# Patient Record
Sex: Female | Born: 1955 | Race: Black or African American | Hispanic: No | Marital: Single | Smoking: Former smoker
Health system: Southern US, Community
[De-identification: ages and names within clinical notes are randomized; demographics above are authoritative.]

## PROBLEM LIST (undated history)

## (undated) ENCOUNTER — Inpatient Hospital Stay (HOSPITAL_COMMUNITY): Admission: EM | Payer: Self-pay | Source: Home / Self Care

## (undated) DIAGNOSIS — I4891 Unspecified atrial fibrillation: Secondary | ICD-10-CM

## (undated) DIAGNOSIS — R011 Cardiac murmur, unspecified: Secondary | ICD-10-CM

## (undated) DIAGNOSIS — M549 Dorsalgia, unspecified: Secondary | ICD-10-CM

## (undated) DIAGNOSIS — Z8709 Personal history of other diseases of the respiratory system: Secondary | ICD-10-CM

## (undated) DIAGNOSIS — Z87442 Personal history of urinary calculi: Secondary | ICD-10-CM

## (undated) DIAGNOSIS — E119 Type 2 diabetes mellitus without complications: Secondary | ICD-10-CM

## (undated) DIAGNOSIS — R51 Headache: Secondary | ICD-10-CM

## (undated) DIAGNOSIS — I209 Angina pectoris, unspecified: Secondary | ICD-10-CM

## (undated) DIAGNOSIS — I272 Pulmonary hypertension, unspecified: Secondary | ICD-10-CM

## (undated) DIAGNOSIS — E781 Pure hyperglyceridemia: Secondary | ICD-10-CM

## (undated) DIAGNOSIS — R0600 Dyspnea, unspecified: Secondary | ICD-10-CM

## (undated) DIAGNOSIS — N289 Disorder of kidney and ureter, unspecified: Secondary | ICD-10-CM

## (undated) DIAGNOSIS — E669 Obesity, unspecified: Secondary | ICD-10-CM

## (undated) DIAGNOSIS — M1990 Unspecified osteoarthritis, unspecified site: Secondary | ICD-10-CM

## (undated) DIAGNOSIS — Z9889 Other specified postprocedural states: Secondary | ICD-10-CM

## (undated) DIAGNOSIS — F419 Anxiety disorder, unspecified: Secondary | ICD-10-CM

## (undated) DIAGNOSIS — I219 Acute myocardial infarction, unspecified: Secondary | ICD-10-CM

## (undated) DIAGNOSIS — I509 Heart failure, unspecified: Secondary | ICD-10-CM

## (undated) DIAGNOSIS — K589 Irritable bowel syndrome without diarrhea: Secondary | ICD-10-CM

## (undated) DIAGNOSIS — N949 Unspecified condition associated with female genital organs and menstrual cycle: Secondary | ICD-10-CM

## (undated) DIAGNOSIS — M5431 Sciatica, right side: Secondary | ICD-10-CM

## (undated) DIAGNOSIS — T1490XA Injury, unspecified, initial encounter: Secondary | ICD-10-CM

## (undated) DIAGNOSIS — G8929 Other chronic pain: Secondary | ICD-10-CM

## (undated) DIAGNOSIS — I10 Essential (primary) hypertension: Secondary | ICD-10-CM

## (undated) DIAGNOSIS — J449 Chronic obstructive pulmonary disease, unspecified: Secondary | ICD-10-CM

## (undated) DIAGNOSIS — G4730 Sleep apnea, unspecified: Secondary | ICD-10-CM

## (undated) DIAGNOSIS — M25559 Pain in unspecified hip: Secondary | ICD-10-CM

## (undated) DIAGNOSIS — K219 Gastro-esophageal reflux disease without esophagitis: Secondary | ICD-10-CM

## (undated) DIAGNOSIS — R232 Flushing: Secondary | ICD-10-CM

## (undated) DIAGNOSIS — R519 Headache, unspecified: Secondary | ICD-10-CM

## (undated) DIAGNOSIS — B009 Herpesviral infection, unspecified: Secondary | ICD-10-CM

## (undated) DIAGNOSIS — I2510 Atherosclerotic heart disease of native coronary artery without angina pectoris: Secondary | ICD-10-CM

## (undated) DIAGNOSIS — K648 Other hemorrhoids: Secondary | ICD-10-CM

## (undated) DIAGNOSIS — D259 Leiomyoma of uterus, unspecified: Secondary | ICD-10-CM

## (undated) HISTORY — DX: Disorder of kidney and ureter, unspecified: N28.9

## (undated) HISTORY — DX: Sleep apnea, unspecified: G47.30

## (undated) HISTORY — PX: OTHER SURGICAL HISTORY: SHX169

## (undated) HISTORY — DX: Morbid (severe) obesity due to excess calories: E66.01

## (undated) HISTORY — PX: APPENDECTOMY: SHX54

## (undated) HISTORY — PX: CARDIAC CATHETERIZATION: SHX172

## (undated) HISTORY — DX: Unspecified osteoarthritis, unspecified site: M19.90

## (undated) HISTORY — DX: Unspecified condition associated with female genital organs and menstrual cycle: N94.9

## (undated) HISTORY — DX: Injury, unspecified, initial encounter: T14.90XA

## (undated) HISTORY — DX: Flushing: R23.2

## (undated) HISTORY — DX: Herpesviral infection, unspecified: B00.9

## (undated) HISTORY — DX: Essential (primary) hypertension: I10

## (undated) HISTORY — DX: Heart failure, unspecified: I50.9

## (undated) HISTORY — DX: Chronic obstructive pulmonary disease, unspecified: J44.9

## (undated) HISTORY — DX: Pure hyperglyceridemia: E78.1

---
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
## 1984-01-01 HISTORY — PX: LAPAROSCOPIC CHOLECYSTECTOMY: SUR755

## 2007-07-29 ENCOUNTER — Encounter: Payer: Self-pay | Admitting: Pulmonary Disease

## 2007-07-29 ENCOUNTER — Inpatient Hospital Stay (HOSPITAL_COMMUNITY): Admission: EM | Admit: 2007-07-29 | Discharge: 2007-07-31 | Payer: Self-pay | Admitting: Emergency Medicine

## 2007-08-09 ENCOUNTER — Encounter: Payer: Self-pay | Admitting: Pulmonary Disease

## 2007-08-14 ENCOUNTER — Emergency Department (HOSPITAL_COMMUNITY): Admission: EM | Admit: 2007-08-14 | Discharge: 2007-08-14 | Payer: Self-pay | Admitting: Emergency Medicine

## 2007-09-15 ENCOUNTER — Ambulatory Visit: Payer: Self-pay | Admitting: Pulmonary Disease

## 2007-09-19 ENCOUNTER — Ambulatory Visit: Admission: RE | Admit: 2007-09-19 | Discharge: 2007-09-19 | Payer: Self-pay | Admitting: Pulmonary Disease

## 2007-09-19 ENCOUNTER — Encounter: Payer: Self-pay | Admitting: Pulmonary Disease

## 2007-10-05 ENCOUNTER — Encounter: Payer: Self-pay | Admitting: Pulmonary Disease

## 2007-10-06 ENCOUNTER — Encounter: Payer: Self-pay | Admitting: Pulmonary Disease

## 2007-11-18 ENCOUNTER — Encounter: Payer: Self-pay | Admitting: Pulmonary Disease

## 2007-12-01 ENCOUNTER — Telehealth (INDEPENDENT_AMBULATORY_CARE_PROVIDER_SITE_OTHER): Payer: Self-pay | Admitting: *Deleted

## 2007-12-11 DIAGNOSIS — M1990 Unspecified osteoarthritis, unspecified site: Secondary | ICD-10-CM | POA: Insufficient documentation

## 2007-12-11 DIAGNOSIS — G4730 Sleep apnea, unspecified: Secondary | ICD-10-CM | POA: Insufficient documentation

## 2007-12-11 DIAGNOSIS — G4733 Obstructive sleep apnea (adult) (pediatric): Secondary | ICD-10-CM | POA: Insufficient documentation

## 2007-12-12 ENCOUNTER — Ambulatory Visit: Payer: Self-pay | Admitting: Pulmonary Disease

## 2007-12-12 DIAGNOSIS — I272 Pulmonary hypertension, unspecified: Secondary | ICD-10-CM | POA: Insufficient documentation

## 2007-12-16 DIAGNOSIS — I5032 Chronic diastolic (congestive) heart failure: Secondary | ICD-10-CM | POA: Insufficient documentation

## 2007-12-16 DIAGNOSIS — I5033 Acute on chronic diastolic (congestive) heart failure: Secondary | ICD-10-CM | POA: Insufficient documentation

## 2008-02-25 ENCOUNTER — Encounter (INDEPENDENT_AMBULATORY_CARE_PROVIDER_SITE_OTHER): Payer: Self-pay | Admitting: *Deleted

## 2008-03-02 ENCOUNTER — Telehealth: Payer: Self-pay | Admitting: Pulmonary Disease

## 2008-03-22 ENCOUNTER — Ambulatory Visit: Payer: Self-pay | Admitting: Pulmonary Disease

## 2008-04-16 ENCOUNTER — Encounter: Payer: Self-pay | Admitting: Pulmonary Disease

## 2009-04-07 ENCOUNTER — Ambulatory Visit (HOSPITAL_COMMUNITY): Admission: RE | Admit: 2009-04-07 | Discharge: 2009-04-07 | Payer: Self-pay | Admitting: Obstetrics and Gynecology

## 2009-06-22 ENCOUNTER — Encounter: Payer: Self-pay | Admitting: Obstetrics and Gynecology

## 2009-06-22 ENCOUNTER — Ambulatory Visit: Payer: Self-pay | Admitting: Obstetrics & Gynecology

## 2009-06-22 LAB — CONVERTED CEMR LAB: CA 125: 11.3 units/mL (ref 0.0–30.2)

## 2009-06-30 ENCOUNTER — Ambulatory Visit (HOSPITAL_COMMUNITY): Admission: RE | Admit: 2009-06-30 | Discharge: 2009-06-30 | Payer: Self-pay | Admitting: Obstetrics and Gynecology

## 2009-07-14 ENCOUNTER — Ambulatory Visit: Payer: Self-pay | Admitting: Obstetrics and Gynecology

## 2009-07-14 LAB — CONVERTED CEMR LAB
BUN: 23 mg/dL (ref 6–23)
Creatinine, Ser: 1.17 mg/dL (ref 0.40–1.20)

## 2009-09-06 ENCOUNTER — Emergency Department (HOSPITAL_COMMUNITY): Admission: EM | Admit: 2009-09-06 | Discharge: 2009-09-06 | Payer: Self-pay | Admitting: Emergency Medicine

## 2010-01-26 ENCOUNTER — Inpatient Hospital Stay (HOSPITAL_COMMUNITY): Admission: AD | Admit: 2010-01-26 | Discharge: 2010-01-29 | Payer: Self-pay | Admitting: Cardiology

## 2010-05-04 ENCOUNTER — Ambulatory Visit (HOSPITAL_COMMUNITY): Admission: RE | Admit: 2010-05-04 | Discharge: 2010-05-04 | Payer: Self-pay | Admitting: Family Medicine

## 2010-05-18 ENCOUNTER — Ambulatory Visit (HOSPITAL_COMMUNITY): Admission: RE | Admit: 2010-05-18 | Discharge: 2010-05-18 | Payer: Self-pay | Admitting: General Surgery

## 2010-06-05 ENCOUNTER — Ambulatory Visit (HOSPITAL_COMMUNITY): Admission: RE | Admit: 2010-06-05 | Discharge: 2010-06-05 | Payer: Self-pay | Admitting: General Surgery

## 2010-06-21 ENCOUNTER — Encounter: Admission: RE | Admit: 2010-06-21 | Discharge: 2010-06-22 | Payer: Self-pay | Admitting: General Surgery

## 2010-10-11 ENCOUNTER — Ambulatory Visit: Payer: Self-pay | Admitting: Gastroenterology

## 2010-10-11 ENCOUNTER — Encounter: Payer: Self-pay | Admitting: Gastroenterology

## 2010-10-11 DIAGNOSIS — R197 Diarrhea, unspecified: Secondary | ICD-10-CM | POA: Insufficient documentation

## 2010-10-12 ENCOUNTER — Encounter: Payer: Self-pay | Admitting: Gastroenterology

## 2010-10-14 ENCOUNTER — Encounter: Payer: Self-pay | Admitting: Gastroenterology

## 2010-10-18 ENCOUNTER — Ambulatory Visit: Payer: Self-pay | Admitting: Gastroenterology

## 2010-10-18 ENCOUNTER — Ambulatory Visit (HOSPITAL_COMMUNITY): Admission: RE | Admit: 2010-10-18 | Discharge: 2010-10-18 | Payer: Self-pay | Admitting: Gastroenterology

## 2010-10-18 HISTORY — PX: COLONOSCOPY: SHX174

## 2010-11-16 ENCOUNTER — Encounter
Admission: RE | Admit: 2010-11-16 | Discharge: 2011-01-02 | Payer: Self-pay | Source: Home / Self Care | Attending: General Surgery | Admitting: General Surgery

## 2010-12-12 ENCOUNTER — Inpatient Hospital Stay (HOSPITAL_COMMUNITY)
Admission: RE | Admit: 2010-12-12 | Discharge: 2010-12-14 | Payer: Self-pay | Source: Home / Self Care | Attending: General Surgery | Admitting: General Surgery

## 2010-12-12 HISTORY — PX: LAPAROSCOPIC GASTRIC BANDING: SHX1100

## 2011-01-02 ENCOUNTER — Encounter: Admit: 2011-01-02 | Discharge: 2011-01-30 | Payer: Self-pay | Attending: General Surgery | Admitting: General Surgery

## 2011-01-09 ENCOUNTER — Encounter (INDEPENDENT_AMBULATORY_CARE_PROVIDER_SITE_OTHER): Payer: Self-pay | Admitting: *Deleted

## 2011-01-22 ENCOUNTER — Encounter: Payer: Self-pay | Admitting: *Deleted

## 2011-01-30 NOTE — Assessment & Plan Note (Signed)
Summary: ov to discuss cpap   Referred by:  Mathis Bud PCP:  [facility]  Chief Complaint:  Follow up. Pt /co increased sob with activity. Pt states she is using her cpap every night. Pt needs rx for nasacort-out of this med.Marland Kitchen  History of Present Illness: Pt has been trying to wear cpap, but awakens with it at night and cannot return to sleep.  No problems with mask or pressure.  No mask fit issues.  Does not think that she has worn enough to judge whether it is helping sleep.  At least she is willing to continue working with me on device tolerance.     Current Allergies: No known allergies       Vital Signs:  Patient Profile:   55 Years Old Female Height:     60 inches O2 Sat:      95 % O2 treatment:    Room Air Temp:     98.0 degrees F oral Pulse rate:   105 / minute BP sitting:   136 / 74  (left arm) Cuff size:   large  Vitals Entered By: Valrie Hart LPN (March 23, 579FGE 624THL AM)             Comments Medications reviewed with patient  ..................................................................Marland KitchenValrie Hart LPN  March 23, 579FGE 624THL AM      Physical Exam  General:     overweight female in no acute distress Nose:     new skin breakdown of pressure necrosis from the CPAP mask     Impression & Recommendations:  Problem # 1:  SLEEP APNEA (ICD-780.57) the patient continues to have frequent awakenings during the night with CPAP, but denies any difficulties with mask fit or pressure tolerance.  At this point, will try her on a sedative hypnotic for approximately 1 to two weeks to see if we can get her sleeping through the night.  I also wonder if she is awakening because of inadequate CPAP pressure to control her events.  Will get her on an auto titrating device pressure optimization.  I will call her with the results of this, but she is to let us know if she continues to have difficulty with sleep disruption.   Problem # 2:  PULMONARY HYPERTENSION, SECONDARY  (ICD-416.8) secondary to chronic diastolic heart failure.  Medications Added to Medication List This Visit: 1)  Trazodone Hcl 50 Mg Tabs (Trazodone hcl) .... At bedtime   Patient Instructions: 1)  will try trazadone at bedtime for the next 2 weeks to help you work thru cpap tolerance. 2)  work on weight loss 3)  will get auto machine for you to use for 2 weeks, to optimize your pressure.  will let you know the results. 4)  f/u with me in 55mos.    Prescriptions: NASACORT AQ 55 MCG/ACT  AERS (TRIAMCINOLONE ACETONIDE(NASAL)) inhale 2 sprays in each nostril once daily  #1 x 4   Entered by:   Valrie Hart LPN   Authorized by:   Kathee Delton MD   Signed by:   Valrie Hart LPN on 579FGE   Method used:   Print then Give to Patient   RxID:   [id number] TRAZODONE HCL 50 MG  TABS (TRAZODONE HCL) At bedtime  #14 x 0   Entered and Authorized by:   Kathee Delton MD   Signed by:   Kathee Delton MD on 03/22/2008   Method used:   Print then Give to Patient   RxID:  [id number]  ] 

## 2011-01-30 NOTE — Letter (Signed)
Summary: TCS ORDER  TCS ORDER   Imported By: Sofie Rower 10/12/2010 10:33:30  _____________________________________________________________________  External Attachment:    Type:   Image     Comment:   External Document

## 2011-01-30 NOTE — Progress Notes (Signed)
Summary: RESPIRATORY PROBLEM LMTCBx2   Phone Note Call from Patient   Caller: Patient Call For: [facility] Summary of Call: NEED TO TALK TO NURSE ABOUT RESPIRATORY PROBLEM PATIENT'S CHART HAS BEEN REQUESTED  Initial call taken by: Gustavus Bryant,  March 02, 2008 2:53 PM  Follow-up for Phone Call        [facility] Follow-up by: Jim Like,  March 02, 2008 4:56 PM  Additional Follow-up for Phone Call Additional follow up Details #1::        LMOM TCB Additional Follow-up by: Valrie Hart LPN,  March  4, 579FGE 9:47 AM    Additional Follow-up for Phone Call Additional follow up Details #2::    spoke with pt. pt stated she got a letter from our office stating she needed to make an appointment to go over test results.  i offered an appt with kc to discuss these results but pt refuse appt. pt currently does not have any medical insurance. i informed her that [employer] could probably set up a payment plan for her. however, pt refused this as well.   pt would like to discuss her results over the phone with dr. Gwenette Greet and would also like to discuss getting on o2. pt states this is something she discussed with dr. Gwenette Greet at her last ov. please advise.  Follow-up by: Valrie Hart LPN,  March  4, 579FGE 2:25 PM  Additional Follow-up for Phone Call Additional follow up Details #3:: Details for Additional Follow-up Action Taken: all of the letters were sent back in the fall for tests at that time.  she did f/u with me in december, and I discussed all of the tests with her.  I started her on cpap at that time.  She does need f/u if she is going to stay on cpap.  She has had an extensive pulmonary work up, and her shortness of breath is due to her heart not lungs.  She does not meet the criteria for home oxygen. please schedule ov with me if she wants to stay on cpap.  we can work out billing issues later  lmom tcb .................................................................Marland KitchenMarland KitchenTilden Dome  March 04, 2008 8:47 AM  Additional Follow-up by: Kathee Delton MD,  March 03, 2008 6:13 PM  Pt aware that she needs OV with [facility] and she is making that appt today with the schedulers; will discuss billing issues later. Blair Hailey

## 2011-01-30 NOTE — Assessment & Plan Note (Signed)
Summary: DIARRHEA: IBS, bile salt , DM, lactose   Visit Type:  Initial Consult Referring Provider:  Hilma Favors Primary Care Provider:  Hilma Favors, M.D.  Chief Complaint:  right side pain/ abnormal stools.  History of Present Illness: Passing white things in her stool. Pain in right side of abd. Never had TCS. Gonna have bariatric surgery. No blood in her stool recently. Has mucous in her stool.  No black stool Heartburn under control w/ diet. Problem with liquids: random, feels spasm, ? frequency. Has diarrhea all day and has to know where BR are along the route: x 4-5 years. No stool studies. No travel, fresh water. On Abx w/i last 6 mos. Just finished Zpk for URI. No change in stools. 2 nl stools: MON and  yesterday. Told she had IBS when she was younger and 4 years ago. Right side pain, pain better after BMs and then happens again. Milk: rare,  Cheese: rare, Ice Cream: to help her sleep.    Current Medications (verified): 1)  Hydrochlorothiazide 25 Mg  Tabs (Hydrochlorothiazide) .... Take 1 Tablet By Mouth Once A Day 2)  Hydrocodone-Acetaminophen 5-500 Mg Tabs (Hydrocodone-Acetaminophen) .... One Tablet Every Six Hours As Needed 3)  Klor-Con M20 20 Meq Cr-Tabs (Potassium Chloride Crys Cr) .... Take 1 Tablet By Mouth Once A Day 4)  Lofibra 160 Mg Tabs (Fenofibrate) .... Take 1 Tablet By Mouth Once A Day 5)  Albuterol Inhaler .... As Directed 6)  Nasal Spray .... As Directed 7)  Cozaar 100 Mg Tabs (Losartan Potassium) .... Take 1 Tablet By Mouth Once A Day 8)  Furosemide 40 Mg Tabs (Furosemide) .... Take 1 Tablet By Mouth Once A Day 9)  Glucophage 500 Mg Tabs (Metformin Hcl) .... One Tablet Twice Daily 10)  Isosorbide Dinitrate 30 Mg Tabs (Isosorbide Dinitrate) .... Take 1 Tablet By Mouth Once A Day 11)  Loratadine 10 Mg Tabs (Loratadine) .... Take 1 Tablet By Mouth Once A Day 12)  Fish Oil 1000 Mg .... One Tablet Three Times Daily 13)  Vitamins With Minerals .... Take 1 Tablet By Mouth Once A  Day  Allergies (verified): No Known Drug Allergies  Past History:  Past Medical History: Osteoarthritis Sleep Apnea, 13 dyslipidemia cholecystectomy hypertension  Past Surgical History: Cholecystectomy/stones Ex Lap Pilonidal Cyst Cone Bx: cervical dysplasia  Family History: Heart disease: both mother and father side Cancer: paternal grandfather( throat), sister(breast) paternal aunts (breast)  No FH of Colon Cancer or polyps  Social History: Patient states former smoker.  single no children  FORMER: healthcare worker, DISABLED from CHF, pulm HTN, arthritis.   Review of Systems       PER HPI OTHERWISE ALL SYSTEMS NEGATIVE.  Vital Signs:  Patient profile:   55 year old female Height:      60 inches Weight:      290 pounds BMI:     56.84 Temp:     98.3 degrees F oral Pulse rate:   72 / minute BP sitting:   112 / 70  (left arm) Cuff size:   large  Vitals Entered By: Waldon Merl LPN (October 12, 624THL 3:07 PM)  Physical Exam  General:  Well developed, well nourished, no acute distress. Head:  Normocephalic and atraumatic. Eyes:  PERRL, no icterus. Mouth:  No deformity or lesions. Neck:  Supple; no masses. Lungs:  Clear throughout to auscultation. Heart:  Regular rate and rhythm; no murmurs. Abdomen:  Soft, MILD TTP right periumbilical region,  nondistended. Normal bowel sounds. obese Extremities:  No edema noted. Neurologic:  Alert and  oriented x4;  grossly normal neurologically.  Impression & Recommendations:  Problem # 1:  DIARRHEA (ICD-787.91)  Cause for diarrhea: IBS, bile salt, diabetes, lactose, doubt microscopic colitis. Follow a low fat/low dairy diabetic diet. SEE HO. Use Levsin as needed for abd pain or diarrhea. MAY CAUSE DRY MOUTH/EYES, URINARY RETENTION, OR drowsiness. Colonoscopy/RANDOM BIOPSIES next week. STOOL STUDIES. ADD CALCIUM 500 MG TWICE DAILY WITH MEALS. FOLLWO UP IN 3 MOS.  CC: PCP  Orders: T-Fecal WBC  [id number]) T-Stool Giardia / Crypto- EIA ([id number]) T-Culture, C-Diff Toxin A/B [id number]) T-Culture, C-Diff Toxin A/B [id number]) T-Culture, C-Diff Toxin A/B [id number])  Patient Instructions: 1)  Cause for diarrhea: IBS, bile salt, diabetes, lactose 2)  Follow a low fat/low dairy diabetic diet. SEE HO. 3)  Use Levsin as needed for abd pain or diarrhea. 4)  MAY CAUSE DRY MOUTH/EYES, URINARY RETENTION, OR drowsiness. 5)  Colonoscopy next week. 6)  ADD CALCIUM 500 MG TWICE DAILY WITH MEALS. 7)  FOLLWO UP IN 3 MOS. 8)  The medication list was reviewed and reconciled.  All changed / newly prescribed medications were explained.  A complete medication list was provided to the patient / caregiver. Prescriptions: LEVSIN/SL 0.125 MG SUBL (HYOSCYAMINE SULFATE) 1-2 sl as needed abd pain or diarrhea. May repeat every 4-6 hours. No more than 8 tabs a day.  #30 x 5   Entered and Authorized by:   Danie Binder MD   Signed by:   Danie Binder MD on 10/11/2010   Method used:   Electronically to        [facility] (retail)       [address]       [place], [place]  [address]       Ph: [phone]       Fax: [phone]   RxIDMZ:[id number]   Appended Document: DIARRHEA: IBS, bile salt , DM, lactose 3 MONTH F/U REMINDER IS IN THE COMPUTER  Appended Document: Orders Update    Clinical Lists Changes  Orders: Added new Service order of New Patient Level V [phone]) - Signed

## 2011-02-01 NOTE — Letter (Signed)
Summary: Recall Office Visit  [facility]  [address]   [place], [place] [address]   Phone: [phone]  Fax: [phone]      January 09, 2011   SHELINA JUNGLING [address] [place],   [address] 06-15-1956   Dear Ms. Berch,   According to our records, it is time for you to schedule a follow-up office visit with [place].   At your convenience, please call [phone] to schedule an office visit. If you have any questions, concerns, or feel that this letter is in error, we would appreciate your call.   Sincerely,    [facility] Ph: [phone]   Fax: [phone]

## 2011-02-02 NOTE — Medication Information (Signed)
Summary: LEVSIN  LEVSIN   Imported By: Hoy Morn 10/11/2010 16:47:01  _____________________________________________________________________  External Attachment:    Type:   Image     Comment:   External Document  Appended Document: LEVSIN    Prescriptions: HYOMAX-SL 0.125 MG SUBL (HYOSCYAMINE SULFATE) 1-2 sl as needed abd pain or diarrhea. May repeat every 4-6 hours. No more than 8 tabs a day.  #30 x 5   Entered and Authorized by:   Laureen Ochs. Bernarda Caffey   Signed by:   Laureen Ochs Lewis PA-C on 10/11/2010   Method used:   Electronically to        [facility] (retail)       [address]       [place], [place]  [address]       Ph: [phone]       Fax: [phone]   RxID:   [id number]

## 2011-02-12 ENCOUNTER — Encounter: Payer: Medicare HMO | Attending: General Surgery | Admitting: *Deleted

## 2011-02-12 DIAGNOSIS — Z09 Encounter for follow-up examination after completed treatment for conditions other than malignant neoplasm: Secondary | ICD-10-CM | POA: Insufficient documentation

## 2011-02-12 DIAGNOSIS — Z713 Dietary counseling and surveillance: Secondary | ICD-10-CM | POA: Insufficient documentation

## 2011-02-12 DIAGNOSIS — Z9884 Bariatric surgery status: Secondary | ICD-10-CM | POA: Insufficient documentation

## 2011-03-12 LAB — CBC
HCT: 33.1 % — ABNORMAL LOW (ref 36.0–46.0)
Hemoglobin: 10.6 g/dL — ABNORMAL LOW (ref 12.0–15.0)
MCH: 31.1 pg (ref 26.0–34.0)
MCHC: 32 g/dL (ref 30.0–36.0)
MCV: 97.1 fL (ref 78.0–100.0)
Platelets: 270 10*3/uL (ref 150–400)
RBC: 3.41 MIL/uL — ABNORMAL LOW (ref 3.87–5.11)
RDW: 13.6 % (ref 11.5–15.5)
WBC: 9.4 10*3/uL (ref 4.0–10.5)

## 2011-03-12 LAB — BASIC METABOLIC PANEL
BUN: 19 mg/dL (ref 6–23)
CO2: 30 mEq/L (ref 19–32)
Calcium: 9.2 mg/dL (ref 8.4–10.5)
Chloride: 107 mEq/L (ref 96–112)
Creatinine, Ser: 1.64 mg/dL — ABNORMAL HIGH (ref 0.4–1.2)
GFR calc Af Amer: 40 mL/min — ABNORMAL LOW (ref >60)
GFR calc non Af Amer: 33 mL/min — ABNORMAL LOW (ref >60)
Glucose, Bld: 106 mg/dL — ABNORMAL HIGH (ref 70–99)
Potassium: 4 mEq/L (ref 3.5–5.1)
Sodium: 144 mEq/L (ref 135–145)

## 2011-03-12 LAB — GLUCOSE, CAPILLARY
Glucose-Capillary: 107 mg/dL — ABNORMAL HIGH (ref 70–99)
Glucose-Capillary: 113 mg/dL — ABNORMAL HIGH (ref 70–99)
Glucose-Capillary: 123 mg/dL — ABNORMAL HIGH (ref 70–99)

## 2011-03-13 LAB — DIFFERENTIAL
Basophils Absolute: 0 10*3/uL (ref 0.0–0.1)
Basophils Absolute: 0 10*3/uL (ref 0.0–0.1)
Basophils Relative: 0 % (ref 0–1)
Basophils Relative: 0 % (ref 0–1)
Eosinophils Absolute: 0.2 10*3/uL (ref 0.0–0.7)
Eosinophils Absolute: 0.3 10*3/uL (ref 0.0–0.7)
Eosinophils Relative: 1 % (ref 0–5)
Eosinophils Relative: 3 % (ref 0–5)
Lymphocytes Relative: 24 % (ref 12–46)
Lymphocytes Relative: 27 % (ref 12–46)
Lymphs Abs: 2.9 10*3/uL (ref 0.7–4.0)
Lymphs Abs: 3.2 10*3/uL (ref 0.7–4.0)
Monocytes Absolute: 0.6 10*3/uL (ref 0.1–1.0)
Monocytes Absolute: 0.8 10*3/uL (ref 0.1–1.0)
Monocytes Relative: 5 % (ref 3–12)
Monocytes Relative: 7 % (ref 3–12)
Neutro Abs: 7.4 10*3/uL (ref 1.7–7.7)
Neutro Abs: 8.2 10*3/uL — ABNORMAL HIGH (ref 1.7–7.7)
Neutrophils Relative %: 63 % (ref 43–77)
Neutrophils Relative %: 70 % (ref 43–77)

## 2011-03-13 LAB — CBC
HCT: 33.4 % — ABNORMAL LOW (ref 36.0–46.0)
HCT: 39.8 % (ref 36.0–46.0)
Hemoglobin: 11.3 g/dL — ABNORMAL LOW (ref 12.0–15.0)
Hemoglobin: 13 g/dL (ref 12.0–15.0)
MCH: 31.1 pg (ref 26.0–34.0)
MCH: 32 pg (ref 26.0–34.0)
MCHC: 32.7 g/dL (ref 30.0–36.0)
MCHC: 33.8 g/dL (ref 30.0–36.0)
MCV: 94.6 fL (ref 78.0–100.0)
MCV: 95.2 fL (ref 78.0–100.0)
Platelets: 275 10*3/uL (ref 150–400)
Platelets: 342 10*3/uL (ref 150–400)
RBC: 3.53 MIL/uL — ABNORMAL LOW (ref 3.87–5.11)
RBC: 4.18 MIL/uL (ref 3.87–5.11)
RDW: 13.4 % (ref 11.5–15.5)
RDW: 13.6 % (ref 11.5–15.5)
WBC: 11.7 10*3/uL — ABNORMAL HIGH (ref 4.0–10.5)
WBC: 11.8 10*3/uL — ABNORMAL HIGH (ref 4.0–10.5)

## 2011-03-13 LAB — COMPREHENSIVE METABOLIC PANEL
ALT: 34 U/L (ref 0–35)
AST: 26 U/L (ref 0–37)
Albumin: 4.3 g/dL (ref 3.5–5.2)
Alkaline Phosphatase: 39 U/L (ref 39–117)
BUN: 37 mg/dL — ABNORMAL HIGH (ref 6–23)
CO2: 31 mEq/L (ref 19–32)
Calcium: 10 mg/dL (ref 8.4–10.5)
Chloride: 93 mEq/L — ABNORMAL LOW (ref 96–112)
Creatinine, Ser: 1.45 mg/dL — ABNORMAL HIGH (ref 0.4–1.2)
GFR calc Af Amer: 46 mL/min — ABNORMAL LOW (ref >60)
GFR calc non Af Amer: 38 mL/min — ABNORMAL LOW (ref >60)
Glucose, Bld: 140 mg/dL — ABNORMAL HIGH (ref 70–99)
Potassium: 3.4 mEq/L — ABNORMAL LOW (ref 3.5–5.1)
Sodium: 141 mEq/L (ref 135–145)
Total Bilirubin: 0.5 mg/dL (ref 0.3–1.2)
Total Protein: 8 g/dL (ref 6.0–8.3)

## 2011-03-13 LAB — GLUCOSE, CAPILLARY
Glucose-Capillary: 113 mg/dL — ABNORMAL HIGH (ref 70–99)
Glucose-Capillary: 117 mg/dL — ABNORMAL HIGH (ref 70–99)
Glucose-Capillary: 120 mg/dL — ABNORMAL HIGH (ref 70–99)
Glucose-Capillary: 123 mg/dL — ABNORMAL HIGH (ref 70–99)
Glucose-Capillary: 126 mg/dL — ABNORMAL HIGH (ref 70–99)
Glucose-Capillary: 134 mg/dL — ABNORMAL HIGH (ref 70–99)
Glucose-Capillary: 152 mg/dL — ABNORMAL HIGH (ref 70–99)
Glucose-Capillary: 158 mg/dL — ABNORMAL HIGH (ref 70–99)
Glucose-Capillary: 170 mg/dL — ABNORMAL HIGH (ref 70–99)
Glucose-Capillary: 197 mg/dL — ABNORMAL HIGH (ref 70–99)

## 2011-03-13 LAB — BASIC METABOLIC PANEL
BUN: 26 mg/dL — ABNORMAL HIGH (ref 6–23)
CO2: 29 mEq/L (ref 19–32)
Calcium: 9.1 mg/dL (ref 8.4–10.5)
Chloride: 103 mEq/L (ref 96–112)
Creatinine, Ser: 1.54 mg/dL — ABNORMAL HIGH (ref 0.4–1.2)
GFR calc Af Amer: 43 mL/min — ABNORMAL LOW (ref >60)
GFR calc non Af Amer: 35 mL/min — ABNORMAL LOW (ref >60)
Glucose, Bld: 115 mg/dL — ABNORMAL HIGH (ref 70–99)
Potassium: 3.7 mEq/L (ref 3.5–5.1)
Sodium: 140 mEq/L (ref 135–145)

## 2011-03-13 LAB — SURGICAL PCR SCREEN
MRSA, PCR: NEGATIVE
Staphylococcus aureus: NEGATIVE

## 2011-03-18 LAB — GLUCOSE, CAPILLARY
Glucose-Capillary: 156 mg/dL — ABNORMAL HIGH (ref 70–99)
Glucose-Capillary: 158 mg/dL — ABNORMAL HIGH (ref 70–99)
Glucose-Capillary: 183 mg/dL — ABNORMAL HIGH (ref 70–99)
Glucose-Capillary: 184 mg/dL — ABNORMAL HIGH (ref 70–99)
Glucose-Capillary: 194 mg/dL — ABNORMAL HIGH (ref 70–99)
Glucose-Capillary: 219 mg/dL — ABNORMAL HIGH (ref 70–99)
Glucose-Capillary: 226 mg/dL — ABNORMAL HIGH (ref 70–99)
Glucose-Capillary: 231 mg/dL — ABNORMAL HIGH (ref 70–99)
Glucose-Capillary: 248 mg/dL — ABNORMAL HIGH (ref 70–99)
Glucose-Capillary: 253 mg/dL — ABNORMAL HIGH (ref 70–99)
Glucose-Capillary: 262 mg/dL — ABNORMAL HIGH (ref 70–99)

## 2011-03-18 LAB — D-DIMER, QUANTITATIVE (NOT AT ARMC): D-Dimer, Quant: 0.36 ug/mL-FEU (ref 0.00–0.48)

## 2011-03-18 LAB — CBC
HCT: 36.4 % (ref 36.0–46.0)
HCT: 36.7 % (ref 36.0–46.0)
HCT: 36.9 % (ref 36.0–46.0)
Hemoglobin: 12.4 g/dL (ref 12.0–15.0)
Hemoglobin: 12.6 g/dL (ref 12.0–15.0)
Hemoglobin: 12.7 g/dL (ref 12.0–15.0)
MCHC: 34.1 g/dL (ref 30.0–36.0)
MCHC: 34.2 g/dL (ref 30.0–36.0)
MCHC: 34.5 g/dL (ref 30.0–36.0)
MCV: 94.6 fL (ref 78.0–100.0)
MCV: 95.9 fL (ref 78.0–100.0)
MCV: 95.9 fL (ref 78.0–100.0)
Platelets: 282 10*3/uL (ref 150–400)
Platelets: 291 10*3/uL (ref 150–400)
Platelets: 297 10*3/uL (ref 150–400)
RBC: 3.79 MIL/uL — ABNORMAL LOW (ref 3.87–5.11)
RBC: 3.85 MIL/uL — ABNORMAL LOW (ref 3.87–5.11)
RBC: 3.88 MIL/uL (ref 3.87–5.11)
RDW: 14.1 % (ref 11.5–15.5)
RDW: 14.6 % (ref 11.5–15.5)
RDW: 15 % (ref 11.5–15.5)
WBC: 11.6 10*3/uL — ABNORMAL HIGH (ref 4.0–10.5)
WBC: 11.9 10*3/uL — ABNORMAL HIGH (ref 4.0–10.5)
WBC: 12.6 10*3/uL — ABNORMAL HIGH (ref 4.0–10.5)

## 2011-03-18 LAB — URINALYSIS, MICROSCOPIC ONLY
Bilirubin Urine: NEGATIVE
Glucose, UA: NEGATIVE mg/dL
Hgb urine dipstick: NEGATIVE
Ketones, ur: NEGATIVE mg/dL
Leukocytes, UA: NEGATIVE
Nitrite: NEGATIVE
Protein, ur: NEGATIVE mg/dL
Specific Gravity, Urine: 1.036 — ABNORMAL HIGH (ref 1.005–1.030)
Urobilinogen, UA: 0.2 mg/dL (ref 0.0–1.0)
pH: 6 (ref 5.0–8.0)

## 2011-03-18 LAB — CARDIAC PANEL(CRET KIN+CKTOT+MB+TROPI)
CK, MB: 1.2 ng/mL (ref 0.3–4.0)
CK, MB: 1.3 ng/mL (ref 0.3–4.0)
CK, MB: 1.9 ng/mL (ref 0.3–4.0)
Relative Index: 1 (ref 0.0–2.5)
Relative Index: 1.1 (ref 0.0–2.5)
Relative Index: 1.6 (ref 0.0–2.5)
Total CK: 113 U/L (ref 7–177)
Total CK: 116 U/L (ref 7–177)
Total CK: 128 U/L (ref 7–177)
Troponin I: 0.01 ng/mL (ref 0.00–0.06)
Troponin I: 0.01 ng/mL (ref 0.00–0.06)
Troponin I: 0.02 ng/mL (ref 0.00–0.06)

## 2011-03-18 LAB — URINE CULTURE
Colony Count: NO GROWTH
Culture: NO GROWTH

## 2011-03-18 LAB — BASIC METABOLIC PANEL
BUN: 21 mg/dL (ref 6–23)
BUN: 22 mg/dL (ref 6–23)
CO2: 28 mEq/L (ref 19–32)
CO2: 30 mEq/L (ref 19–32)
Calcium: 9.4 mg/dL (ref 8.4–10.5)
Calcium: 9.6 mg/dL (ref 8.4–10.5)
Chloride: 100 mEq/L (ref 96–112)
Chloride: 94 mEq/L — ABNORMAL LOW (ref 96–112)
Creatinine, Ser: 1.19 mg/dL (ref 0.4–1.2)
Creatinine, Ser: 1.26 mg/dL — ABNORMAL HIGH (ref 0.4–1.2)
GFR calc Af Amer: 54 mL/min — ABNORMAL LOW (ref >60)
GFR calc Af Amer: 58 mL/min — ABNORMAL LOW (ref >60)
GFR calc non Af Amer: 45 mL/min — ABNORMAL LOW (ref >60)
GFR calc non Af Amer: 48 mL/min — ABNORMAL LOW (ref >60)
Glucose, Bld: 137 mg/dL — ABNORMAL HIGH (ref 70–99)
Glucose, Bld: 229 mg/dL — ABNORMAL HIGH (ref 70–99)
Potassium: 3.2 mEq/L — ABNORMAL LOW (ref 3.5–5.1)
Potassium: 4.4 mEq/L (ref 3.5–5.1)
Sodium: 134 mEq/L — ABNORMAL LOW (ref 135–145)
Sodium: 138 mEq/L (ref 135–145)

## 2011-03-18 LAB — DIFFERENTIAL
Basophils Absolute: 0 10*3/uL (ref 0.0–0.1)
Basophils Relative: 0 % (ref 0–1)
Eosinophils Absolute: 0.5 10*3/uL (ref 0.0–0.7)
Eosinophils Relative: 4 % (ref 0–5)
Lymphocytes Relative: 31 % (ref 12–46)
Lymphs Abs: 3.5 10*3/uL (ref 0.7–4.0)
Monocytes Absolute: 0.6 10*3/uL (ref 0.1–1.0)
Monocytes Relative: 6 % (ref 3–12)
Neutro Abs: 6.9 10*3/uL (ref 1.7–7.7)
Neutrophils Relative %: 59 % (ref 43–77)

## 2011-03-18 LAB — BLOOD GAS, ARTERIAL
Acid-Base Excess: 3.2 mmol/L — ABNORMAL HIGH (ref 0.0–2.0)
Bicarbonate: 27 mEq/L — ABNORMAL HIGH (ref 20.0–24.0)
O2 Saturation: 97.1 %
Patient temperature: 98.6
TCO2: 28.3 mmol/L (ref 0–100)
pCO2 arterial: 40.3 mmHg (ref 35.0–45.0)
pH, Arterial: 7.442 — ABNORMAL HIGH (ref 7.350–7.400)
pO2, Arterial: 83.9 mmHg (ref 80.0–100.0)

## 2011-03-18 LAB — BRAIN NATRIURETIC PEPTIDE: Pro B Natriuretic peptide (BNP): <30 pg/mL (ref 0.0–100.0)

## 2011-04-05 ENCOUNTER — Ambulatory Visit: Payer: Self-pay | Admitting: *Deleted

## 2011-04-06 LAB — COMPREHENSIVE METABOLIC PANEL
ALT: 32 U/L (ref 0–35)
AST: 29 U/L (ref 0–37)
Albumin: 4.6 g/dL (ref 3.5–5.2)
Alkaline Phosphatase: 53 U/L (ref 39–117)
BUN: 24 mg/dL — ABNORMAL HIGH (ref 6–23)
CO2: 30 mEq/L (ref 19–32)
Calcium: 10.2 mg/dL (ref 8.4–10.5)
Chloride: 96 mEq/L (ref 96–112)
Creatinine, Ser: 1.11 mg/dL (ref 0.4–1.2)
GFR calc Af Amer: >60 mL/min (ref >60)
GFR calc non Af Amer: 52 mL/min — ABNORMAL LOW (ref >60)
Glucose, Bld: 206 mg/dL — ABNORMAL HIGH (ref 70–99)
Potassium: 3.4 mEq/L — ABNORMAL LOW (ref 3.5–5.1)
Sodium: 137 mEq/L (ref 135–145)
Total Bilirubin: 0.8 mg/dL (ref 0.3–1.2)
Total Protein: 9.3 g/dL — ABNORMAL HIGH (ref 6.0–8.3)

## 2011-04-06 LAB — DIFFERENTIAL
Basophils Absolute: 0 10*3/uL (ref 0.0–0.1)
Basophils Relative: 0 % (ref 0–1)
Eosinophils Absolute: 0.1 10*3/uL (ref 0.0–0.7)
Eosinophils Relative: 1 % (ref 0–5)
Lymphocytes Relative: 12 % (ref 12–46)
Lymphs Abs: 1.8 10*3/uL (ref 0.7–4.0)
Monocytes Absolute: 0.2 10*3/uL (ref 0.1–1.0)
Monocytes Relative: 2 % — ABNORMAL LOW (ref 3–12)
Neutro Abs: 12.2 10*3/uL — ABNORMAL HIGH (ref 1.7–7.7)
Neutrophils Relative %: 85 % — ABNORMAL HIGH (ref 43–77)

## 2011-04-06 LAB — CBC
HCT: 39 % (ref 36.0–46.0)
Hemoglobin: 13.6 g/dL (ref 12.0–15.0)
MCHC: 34.8 g/dL (ref 30.0–36.0)
MCV: 95 fL (ref 78.0–100.0)
Platelets: 294 10*3/uL (ref 150–400)
RBC: 4.1 MIL/uL (ref 3.87–5.11)
RDW: 13.9 % (ref 11.5–15.5)
WBC: 14.3 10*3/uL — ABNORMAL HIGH (ref 4.0–10.5)

## 2011-04-09 LAB — POCT PREGNANCY, URINE: Preg Test, Ur: NEGATIVE

## 2011-04-24 ENCOUNTER — Encounter: Payer: Medicare Other | Admitting: *Deleted

## 2011-05-10 ENCOUNTER — Other Ambulatory Visit (HOSPITAL_COMMUNITY): Payer: Self-pay | Admitting: Family Medicine

## 2011-05-10 DIAGNOSIS — Z1231 Encounter for screening mammogram for malignant neoplasm of breast: Secondary | ICD-10-CM

## 2011-05-15 NOTE — Group Therapy Note (Signed)
NAME:  Sara Tran, WEITMAN NO.:  [id number]   MEDICAL RECORD NO.:  [id number]          PATIENT TYPE:  WOC   LOCATION:  [facility]                   FACILITY:  [facility]   PHYSICIAN:  Andrew Au, MD        DATE OF BIRTH:  [birth year]   DATE OF SERVICE:                                  CLINIC NOTE   DICTATING RESIDENT PHYSICIAN:  Dr. Ria Bush, Family Medicine  Resident, for Dr. Andrew Au   CHIEF COMPLAINT:  Endometrial biopsy in patient with history of  perimenopausal bleeding and right ovarian complexes.   HISTORY OF PRESENT ILLNESS:  The patient is a 55 year old nulliparous  African American female who presents from PCP's office as a referral for  longstanding pelvic pain and abnormal recent Pap smear showing it to be  negative for intraepithelial lesion but endometrial cells present.  Patient states that back in 1997, she had cervical dysplasia, underwent  LEEP procedure and since then had had normal Pap smears until this year.  She also endorses imaging done in 2008 which revealed a right complex  ovarian cyst.  I have a CT scan record available which shows that there  is a complex cyst that __________ in the right adnexa measuring 7.4 x 5  cm with septations and possible solid component.  Per patient, she had a  follow-up ultrasound which showed questionable resolution of this cyst.  Patient also endorses having cramping with periods since she started  having periods.  She states that around the age of 55, she stopped  having periods for a full year and then recently in January 2010 started  having abnormal bleeding again.   PAST MEDICAL HISTORY:  Reviewed and significant for the following:  1. Arthritis.  2. Hypertension.  3. Morbid obesity.  4. Pelvic pain.  5. History of ovarian cyst.  6. History of cervical dysplasia.   ALLERGIES:  No known drug allergies.   MEDICATIONS:  Reviewed and per record including Lasix, Imdur,  hydrochlorothiazide,  Cozaar, potassium and Vicodin p.r.n.   SOCIAL HISTORY:  Patient denies alcohol, recreational drugs or smoking.  She states she lives alone.   PHYSICAL EXAMINATION:  VITAL SIGNS:  Temperature 97.6, pulse 82, blood  pressure 131/67, weight 287.7 pounds, height 5 feet 0 inches.  GENERAL:  Obese African American female in no acute distress sitting on  exam table.  GU:  No external genital lesions identified.  Vagina with normal rugae.  Some blood in vaginal vault.  Patient states that she started bleeding a  couple of days ago.  Endometrial biopsy was attempted; informed consent  obtained and in chart.  Anterior cervix was grasped with tenaculum and  was attempted to be inserted into the cervix.  Endometrial biopsy was  unsuccessful secondary to cervical stenosis.   LABORATORIES:  Today:  Urine pregnancy test was negative.   ASSESSMENT/PLAN:  The patient is a 55 year old nulliparous who presents  with several concerns including the following:  1. Abnormal recent Pap in March 2010 showing endometrial cells      present.  We will obtain an ultrasound to evaluate the  cervix and      endometrial stripe and have patient follow up in 2 weeks.  2. Irregular perimenopausal bleeding.  Please see #1.  We briefly      discussed therapies for her bleeding, and patient states that she      would desire a hysterectomy.  3. History of complex right ovarian cyst.  We will repeat ultrasound      to follow this and check a CA-125 level.  We will have patient      follow up in 2 weeks.  We discussed how this history of right      ovarian cyst needs further evaluation and may down the road      necessitate removal of the ovary.   Again, this is Dr. Ria Bush dictating clinic note for attending,  Dr. Andrew Au.     ______________________________  Andrew Au, MD    ______________________________  Andrew Au, MD    PR/MEDQ  D:  06/22/2009  T:  06/22/2009  Job:  [id number]

## 2011-05-15 NOTE — Assessment & Plan Note (Signed)
[facility]                             PULMONARY OFFICE NOTE   NAME:Sara Tran, DARLIENE CUCINOTTA                       MRN:          [id number]  DATE:09/15/2007                            DOB:          1956-08-23    Patient is a 55 year old black female whom I have been asked to see for  pulmonary hypertension, and also dyspnea.  The patient has had chronic  dyspnea on exertion for many years, but stated approximately 3 months  ago she had the sudden onset of significant worsening.  She has also had  atypical chest pain, as well as some lightheadedness and dizziness.  The  patient underwent cardiac catheterization on July 29, 2007 with no  significant coronary disease noted.  Her right heart catheterization did  show elevated left atrial pressures at 19 cm with suggestion of  diastolic dysfunction.  Her peak pulmonary artery pressures were 57/12.  The patient states that she has a one-half-block dyspnea on exertion at  a moderate pace.  She will get winded making a bed or vacuuming 1 room  of her house.  She states that she can sometimes become short of breath  with conversation.  She has chronic lower extremity edema, but no  history of thromboembolic disease.  The patient has been on phentermine  in the past on 3 different occasions, the longest time being  approximately 4 to 6 months.  She does have a history of osteoarthritis,  but nothing else to suggest connective tissue disease.  Also, of note,  the patient has a history very suggestive of obstructive sleep apnea,  and is scheduled for nocturnal polysomnography in the upcoming weeks.   PAST MEDICAL HISTORY:  Significant for:  1. Hypertension.  2. History of dyslipidemia.  3. History of osteoarthritis.  4. Status post cholecystectomy.   CURRENT MEDICATIONS:  Include:  1. Lotrel 10/20 one daily.  2. Aspirin 81 mg daily.  3. Hydrochlorothiazide 25 mg daily.  4. Nabumetone 500 mg b.i.d.  5. Tricor 145  daily.   THE PATIENT HAS NO KNOWN DRUG ALLERGIES.   SOCIAL HISTORY:  She is single.  She works as a Dietitian.  She  lives alone.  She has a history of smoking 1/2 pack per day for 20  years.  Has not smoked since 1997.   FAMILY HISTORY:  Remarkable for heart disease in her mother and father.  She does have a sister who has a history of breast cancer.   REVIEW OF SYSTEMS:  As per history of present illness.  Also, see  patient intake form documented in the chart.   PHYSICAL EXAMINATION:  GENERAL:  She is a morbidly obese black female in  no acute distress.  Blood pressure is 138/86.  Pulse 77.  Temperature is 98.4.  Weight is  273 pounds.  She is 5 feet tall.  Her O2 saturation on room air is 99%.  HEENT:  Pupils equal, round, and reactive to light and accommodation.  Extraocular muscles are intact.  Nares patent without discharge.  Oropharynx is clear.  NECK:  Supple without  JVD or lymphadenopathy.  There is no palpable  thyromegaly.  CHEST:  Totally clear to auscultation.  CARDIAC:  Exam reveals regular rate and rhythm with no murmurs, rubs, or  gallops.  ABDOMEN:  Soft and nontender with good bowel sounds.  GENITAL EXAM:  Not done and not indicated.  RECTAL EXAM:  Not done and not indicated.  BREAST EXAM:  Not done and not indicated.  LOWER EXTREMITIES:  With trace edema.  Pulses are intact distally.  NEUROLOGIC:  Alert and oriented with no obvious motor deficits.   LABORATORY DATA:  Chest x-ray shows low lung volume with no acute  process.   IMPRESSION:  Pulmonary hypertension noted on recent right heart  catheterization with worsening dyspnea on exertion over the last 3  months.  The patient is morbidly obese, has gained 20 pounds in the last  2 years, has a history suggestive of sleep apnea, and does have  diastolic dysfunction noted with her cardiac workup and elevated left  atrial pressures.  I suspect her symptoms are multifactorial, but we  need to do  everything we can in order to rule out other processes, and  also to treat the known causative factors for her elevated pressures.   PLAN:  1. Patient will have a followup echo and sleep study in the upcoming      weeks.  2. Work on weight loss.  3. We will schedule for a VQ scan for completeness to make sure she      does not have chronic thromboembolic disease, and also PFTs to rule      out underlying lung disease.  4. The patient will follow up after the above.     Kathee Delton, MD,FCCP  Electronically Signed    [facility]  DD: 09/16/2007  DT: 09/16/2007  Job #: [id number]   cc:   Leslye Peer, MD  Bonne Dolores, M.D.

## 2011-05-15 NOTE — Group Therapy Note (Signed)
NAME:  Sara Tran, Sara Tran NO.:  [id number]   MEDICAL RECORD NO.:  [id number]          PATIENT TYPE:  WOC   LOCATION:  [facility]                   FACILITY:  [facility]   PHYSICIAN:  Andrew Au, MD        DATE OF BIRTH:  [birth year]   DATE OF SERVICE:  07/14/2009                                  CLINIC NOTE   The patient is a 55 year old, nulliparous, African American female,  morbidly obese, who had previously attempted to do a endometrial biopsy  on for abnormal perimenopausal bleeding, but failed because of the  cervical stenosis.  She had had previous cervical dysplasia and  underwent a LEEP procedure in 1997, that plus the atrophy probably added  to this.  In addition, she had had a ovarian cyst on ultrasound and we  want to follow that.  We have done an ultrasound to see if we could see  anything about the uterus as well.  It looks like she has a small  fibroid in the uterus, but the ultrasound which gave the appearance that  the lining in the endometrium was 1.1 cm somewhat thickened about what  she would expect and possible ovarian cyst was not really clear because  of the obesity of the patient.  We are going to followup that with a CT  to see if we can see anything clear, has really nobody really would  __________ operating on this patient.  In addition to that, I have given  her Cytotec to take a couple hours before she comes in so we can attempt  to do endometrial biopsy that in addition to the dilators that of arrive  we may be successful.  She did want to do that today and at any case  because her arthritis is acting up on her right side and she can barely  bend her knee today.  So, the impression is perimenopausal abnormal  bleeding, ovarian cyst.  Poor results with ultrasound.  Plan is  endometrial biopsy and repeat imaging with a CT.           ______________________________  Andrew Au, MD     PR/MEDQ  D:  07/14/2009  T:  07/15/2009  Job:  [id number]

## 2011-05-15 NOTE — H&P (Signed)
NAME:  Sara Tran, Sara Tran NO.:  [id number]   MEDICAL RECORD NO.:  [id number]           PATIENT TYPE:   LOCATION:                                 FACILITY:   PHYSICIAN:  Leslye Peer, MD       DATE OF BIRTH:  10/23/1956   DATE OF ADMISSION:  07/29/2007  DATE OF DISCHARGE:                              HISTORY & PHYSICAL   REFERRING PHYSICIAN:  Bonne Dolores, M.D.   Ms. Dicristina is a very pleasant, 55 year old female with a past medical  history of hypertension, osteoarthritis, dyslipidemia not currently  treated and morbid obesity who was referred for an urgent visit today as  an add on from Dr. Caron Presume for chest discomfort. She reports that she  has had a long history of sporadic chest discomfort which changed  decidedly in nature in May of this year. It became much more frequent  and more severe. She describes the chest pain as a gripping pain and  reports radiation to the left elbow with numbness in her fingers. She  becomes flushed and dizzy and experiences a headache. She also at times  feels like she becomes somewhat confused with these episodes. She denies  nausea or vomiting but does admit to shortness of breath with this  episode. She reports that she was taken out of work last week as the  chest pain had become quite severe. It is clearly exertional in nature  although it has become such that she has it at rest as well.   She has independent complaints of shortness of breath and dyspnea on  exertion which is progressive in nature. She reports occasional  palpitations which last for just a few seconds and cause her to become  dizzy but without chest pain or shortness of breath. She reports a 2-  year history of lower extremity edema. She reports increasing activity  intolerance. She has 3-4 pillow orthopnea over the past 6 months. She  denies PND. She previously exercised before May but none recently.  Cardiac risk factors are notable for hypertension,  dyslipidemia, and a  very strong family history of coronary artery disease in her mother and  her father. She is a nonsmoker and not known to be diabetic.   She reports, on query, early morning headaches which have become more  prevalent and severe. She also reports daytime somnolence. When I put  this constellation of findings together, this is very suggestive along  with body habitus of severe obstructive sleep apnea. This certainly  could be an umbrella for all of her complaints; however, with her very  strong family history of coronary artery disease, I certainly would not  exclude the possibility of occult coronary artery disease as an etiology  as well. Obviously the two are not mutually exclusive.   PAST MEDICAL HISTORY:  1. Hypertension.  2. Osteoarthritis.  3. Dyslipidemia not currently treated.  4. Benign breast cyst.  5. Cervical dysplasia status post conization  6. Cholecystectomy.  7. Appendectomy.  8. D&C.   ALLERGIES:  None known.   CURRENT MEDICATIONS:  1. Lotrel  10/20 daily.  2. Hydrochlorothiazide 25 daily.  3. Etodolac 400 mg daily.  4. Xanax 0.5 mg q.h.s. p.r.n.   SOCIAL HISTORY:  Negative for tobacco use. She quit smoking in 1996. She  drinks alcohol rarely. She denies illicit drug use. She lives alone. She  sponsors a child in [place] whom she is trying to adopt and she works at  [employer].   FAMILY HISTORY:  Notable for a mother who was diagnosed in her early 8s  with heart disease. Her father also had heart disease and diabetes. He  also had peripheral arterial disease. Arthritis and breast cancer occur  in her family.   REVIEW OF SYSTEMS:  As outlined above along with an upper respiratory  tract infection for which she was on a Z-Pak and has almost completed  this regimen. MUSCULOSKELETAL:  She does have difficulty with arthritis  in her joints. SKIN:  Negative. EYES:  She wears glasses.  RESPIRATORY/CARDIOVASCULAR:  As outlined  above. GI:  She had diarrhea  last week with her upper respiratory tract infection but this has since  resolved. She has had occasional blood in her stools when she over  exerts but this has been managed by Dr. Caron Presume. GU:  Negative. NEURO:  As outlined above. ENT:  Notable for seasonal allergies. PSYCHE:  Negative. HEME/LYMPH:  Negative. VASCULAR:  Somewhat suggestive of  claudication although I am not convinced by history.   PHYSICAL EXAMINATION:  Reveals a pleasant, morbidly obese, African-  American female in no acute distress, alert and oriented x3.  Height is 5 feet tall, weight is 275 pounds. Blood pressure on the left  144/74, on the right 140/74 with a heart rate of 78.  NECK:  Negative for JVD at 90 degrees although body habitus precludes  definitive exclusion. No obvious bruits are noted. No obvious  lymphadenopathy or thyromegaly.  LUNGS:  Notable for diminished breath sounds at the bases. No definitive  crackles, wheezes or rhonchi are noted.  CARDIAC:  Reveals a regular rate and rhythm with a [id number] systolic murmur  heard best at the left sternal border. Difficult to palpate the PMI.  There is no obvious heave or lift. Carotid upstroke is normal, 2+  bilaterally without delay.  ABDOMEN:  Obese, soft, nontender, nondistended with positive bowel  sounds in all 4 quadrants.  No significant edema is appreciated. Distal pulses 2+ and equal  bilaterally.   An EKG performed in the office today reveals normal sinus rhythm at 79  beats per minute with left axis deviation and late transition with  nonspecific ST changes. She does have borderline voltage criteria for  LVH.   No recent lab work for review.   IMPRESSION:  1. Chest discomfort as outlined above.  2. Shortness of breath/dyspnea on exertion, progressive.  3. Palpitations as outlined above.  4. Three-pillow orthopnea for the past 6 months.  5. History and body habitus are very consistent with significant       obstructive sleep apnea.  6. Hypertension.  7. Osteoarthritis.  8. Dyslipidemia not currently treated.  23. Very strong family history of premature coronary artery disease in      her mother and her father.   RECOMMENDATIONS:  We had a long discussion regarding options. Given the  severity of her symptoms, I am worried that she needs CPAP or BiPAP  promptly. I am not sure it would be advisable to wait for a sleep study  to guide [place] in this regard. Additionally  with her complaints of chest  discomfort and strong family history and risk factors, I did discuss the  possibility of hospitalization with plans for right and left heart  catheterization tomorrow and CPAP or BiPAP tonight. She is amendable to  this plan. We discussed the logistics of the procedure as well as risks  and benefits. The risks include but not limited to bleeding, infection,  nerve or vessel damage, emergent surgery, arrhythmia, kidney damage,  heart attack, stroke and death. She is amenable to proceed. She does  request something for nerves before the procedure. Again this needs to  be a right and left heart catheterization and I believe with her  hypertension I would escort the renals unless her renal function argues  against it. We therefore will admit her to [facility] on  telemetry, get her started on CPAP or BiPAP and plan for right and left  heart catheterization tomorrow if lab work will allow. Will also rule  her out by enzymes, check a chest x-ray and use IV nitroglycerin and IV  heparin as appropriate. Will guaiac her stools.           ______________________________  Leslye Peer, MD     AB/MEDQ  D:  07/29/2007  T:  07/29/2007  Job:  [id number]

## 2011-05-15 NOTE — Discharge Summary (Signed)
NAMECYLINDA, Tran NO.:  [id number]   MEDICAL RECORD NO.:  [id number]          PATIENT TYPE:  INP   LOCATION:  2925                         FACILITY:  [facility]   PHYSICIAN:  Octavia Heir, MD  DATE OF BIRTH:  25-Feb-1956   DATE OF ADMISSION:  07/29/2007  DATE OF DISCHARGE:  07/31/2007                               DISCHARGE SUMMARY   [name] is a 55 year old female with prior history of hypertension,  osteoarthritis, dyslipidemia.  She was admitted with apparently chest  pain symptoms, progressive since May of 2008.  Her cardiac panel was  negative x2.  She was recommended to undergo cardiac catheterization  which she did on July 30, 2007; this revealed normal coronary arteries,  left dominant circulation, normal LV systolic function, mild diastolic  dysfunction and elevated LVEDP.  She had moderate pulmonary  hypertension.  The following day, she was seen by Dr. Tami Ribas,  considered stable for discharge home and to follow up with Dr. Mathis Bud  in two to three weeks.   LABS:  Showed a hemoglobin of 12.7, hematocrit 367.9, WBCs 14.4,  platelets 312.  Sodium 139, potassium 3.9, BUN 13, creatinine 0.86 and  glucose 118.  AST was 19, ALT was 20.  Hemoglobin A1c was 5.6.  CK-MBs  and troponins were all negative.  Total cholesterol was 185,  triglycerides 225, HDL was 53 and LDL was 77.  TSH was 1.964.   Chest x-ray showed low lung volume, no acute findings.   DISCHARGE MEDICATIONS:  1. Benazepril 20 mg a day.  2. Amlodipine 10 mg a day.  3. HCTZ 25 mg a day.  4. Simvastatin 20 mg a day.  5. Aspirin 81 mg everyday.   DISCHARGE DIAGNOSES:  1. Chest pain, unknown etiology, not coronary artery ischemia related      status post cath with normal coronary arteries.  2. Normal left ventricular function.  3. Mild diastolic dysfunction.  4. Moderate pulmonary hypertension.  5. Hypertension.  6. Osteoarthritis.  7. Obesity.      Sara Tran, N.P.      Octavia Heir, MD  Electronically Signed    BB/MEDQ  D:  09/24/2007  T:  09/24/2007  Job:  [id number]   cc:   Bonne Dolores, M.D.

## 2011-05-15 NOTE — Cardiovascular Report (Signed)
NAMENARELLE, MOERBE NO.:  [id number]   MEDICAL RECORD NO.:  [id number]          PATIENT TYPE:  INP   LOCATION:  2925                         FACILITY:  [facility]   PHYSICIAN:  Eden Lathe. Einar Gip, MD       DATE OF BIRTH:  [birth year]   DATE OF PROCEDURE:  07/30/2007  DATE OF DISCHARGE:                            CARDIAC CATHETERIZATION   CARDIOLOGIST:  Dr. Mathis Bud.   PROCEDURE PERFORMED:  1. Left ventriculography.  2. Selective right and left coronary arteriography.  3. Select renal arteriography.  4. Right heart catheterization .   INDICATIONS:  Sara Tran is 55 year old female with morbid  obesity, hypertension, hyperlipidemia, who was admitted to the hospital  with chest pain.  She also complained of dizziness on exertion,  shortness of breath.  She has severe symptoms of obstructive sleep  apnea.  Given her morbid obesity, it was felt best for her to be  admitted to the hospital and to evaluate her coronary anatomy for  definite diagnosis of coronary disease.  Right heart catheterization was  performed to evaluate her shortness of breath and pulmonary  hypertension.   Selective renal arteriography was performed because of hypertension to  evaluate for renal artery stenosis.   HEMODYNAMIC DATA:  Right heart catheterization.   RA pressures 13/10 with a mean of 7 mmHg.   RV pressure 123456 with end diastolic pressure of 15 mmHg.   PA pressure 49/23 with a mean of 35 mmHg.   Pulmonary capillary wedge 17/50 with a mean of 14 mmHg.   The PA saturation was 68%.  RA saturation was 71% and aortic saturation  was 90%.   Cardiac output was 6.0 with a cardiac index of 2.80 by Fick.   HEMODYNAMIC DATA LEFT HEART CATHETERIZATION.:  Left atrial pressures was  28/13 with a mean of 19 mmHg.   LV pressure 0000000 with end diastolic pressure of 18 mmHg.  Aortic  pressure 167/86 with a mean of 123 mmHg.  There was no pressure gradient  across the aortic valve.   ANGIOGRAPHIC DATA:  Left ventricle:  Left ventricle systolic function  was normal with ejection fraction of 60%.  There was no significant  mitral regurgitation.   Right coronary artery:  Right coronary artery is a small nondominant  coronary artery.   Left main:  Left main is a large caliber vessel.  The left main is  normal.   Circumflex:  Circumflex is a large vessel.  Gives origin to large obtuse  marginal.  Distally it gives origin to several PDA branches.  It is  smooth and normal.   Ramus intermedius:  Ramus intermedius is small to moderate caliber  vessel.  Smooth and normal.   LAD:  LAD is large vessel.  It ends before reaching the apex and gives  origin to several small diagonals.  It is smooth and normal.   IMPRESSION:  1. Normal left ventricular systolic function, ejection fraction 55-      60%.  No significant mitral regurgitation.  Normal coronary      arteries.  2. Mild diastolic  dysfunction with mild elevation in left ventricle      end-diastolic pressure.  3. Moderate pulmonary hypertension with presumed cardiac output and      cardiac index.  4. Normal renal arteries.  Bilateral renal arteries were selectively      engaged and angiogram and these revealed no evidence of renal      artery stenosis.   RECOMMENDATIONS:  Evaluation for noncardiac cause of chest pain is  indicated.  Therapy for her severe obstructive sleep apnea, weight loss,  exercise is indicated.   Total of 125 mL of contrast was utilized for diagnostic angiography.  Right femoral arterial access was closed with Star close with excellent  hemostasis and venous sheath was pulled in the cath lab and manual  pressure was held with adequate hemostasis.  No immediate complications.   TECHNIQUE OF THE PROCEDURE:  Under usual sterile precautions, using a 6-  French right femoral arterial access and a 7-French right femoral venous  access, left and right heart catheterization was performed  respectively  through the sheath.   Right heart catheterization was performed using a balloon-tip Swan-Ganz  catheter which was easily advance the pulmonary capillary wedge  position.  Right-sided hemodynamics were carefully analyzed.  The  catheter was then pulled out of body.   Left heart catheterization was performed using a 6-French multipurpose  B2 catheter that was advanced into the ascending aorta and then into the  left ventricle.  Left ventriculography was performed both in LAO and RAO  projection.  Because of inadequate visualization, 15 mL per second for a  total of 30 mL were injected in the RAO projection with adequate  visualization.  The catheter was then pulled into the ascending aorta  and right coronary artery was selectively engaged and angiography was  performed.  Then the left main coronary artery selectively engaged and  angiography was performed.  The catheter was pulled back in the  abdominal aorta and selective left  renal arteriography was performed.  Then the catheter was pulled out of  the body in usual fashion.  Right femoral angiography was performed  through the arterial access sheath and access closed with Star close  with excellent hemostasis.      Eden Lathe. Einar Gip, MD  Electronically Signed     JRG/MEDQ  D:  07/30/2007  T:  07/31/2007  Job:  [id number]   cc:   Leslye Peer, MD  Bonne Dolores, M.D.

## 2011-05-18 ENCOUNTER — Ambulatory Visit (HOSPITAL_COMMUNITY)
Admission: RE | Admit: 2011-05-18 | Discharge: 2011-05-18 | Disposition: A | Discharge status: Home / Self Care | Payer: Medicare Other | Source: Ambulatory Visit | Attending: Family Medicine | Admitting: Family Medicine

## 2011-05-18 DIAGNOSIS — Z1231 Encounter for screening mammogram for malignant neoplasm of breast: Secondary | ICD-10-CM | POA: Insufficient documentation

## 2011-08-08 ENCOUNTER — Encounter (INDEPENDENT_AMBULATORY_CARE_PROVIDER_SITE_OTHER): Payer: Self-pay | Admitting: General Surgery

## 2011-08-09 ENCOUNTER — Encounter (INDEPENDENT_AMBULATORY_CARE_PROVIDER_SITE_OTHER): Payer: Self-pay | Admitting: General Surgery

## 2011-08-09 ENCOUNTER — Ambulatory Visit (INDEPENDENT_AMBULATORY_CARE_PROVIDER_SITE_OTHER): Payer: Medicare PPO | Admitting: General Surgery

## 2011-08-09 DIAGNOSIS — E119 Type 2 diabetes mellitus without complications: Secondary | ICD-10-CM

## 2011-08-09 DIAGNOSIS — Z4651 Encounter for fitting and adjustment of gastric lap band: Secondary | ICD-10-CM

## 2011-08-09 NOTE — Progress Notes (Signed)
Patient returns for followup of lap band placed 12 1311. I last saw her April 20, 2011. We did a 1 cc fill at that time. She states that for about a month she had increased restriction and had weight loss. However over the last couple of months she has been able to be more he has regained some weight. Her current weight of 283 he is up 4 pounds from her last visit and down just 2 pounds from surgery. She however has been exercising more regularly and feels that she is down to smaller size.  With this information we went ahead with a 1 cc fill today to bring her up to 6.5 cc. She was able to tolerate water well. Reviewed diet and exercise strategies. We talked about the importance of regular followup. She is to return in 6 weeks.

## 2011-08-09 NOTE — Patient Instructions (Signed)
Call for concerns. Concentrate on small bites and eating slowly. Continue your exercise. Return in 6 weeks.

## 2011-09-27 ENCOUNTER — Ambulatory Visit (INDEPENDENT_AMBULATORY_CARE_PROVIDER_SITE_OTHER): Payer: Medicare Other | Admitting: General Surgery

## 2011-09-27 ENCOUNTER — Encounter (INDEPENDENT_AMBULATORY_CARE_PROVIDER_SITE_OTHER): Payer: Self-pay | Admitting: General Surgery

## 2011-09-27 NOTE — Patient Instructions (Signed)
Continue to work on your exercise routine. He very slowly very small bites.

## 2011-09-27 NOTE — Progress Notes (Signed)
Patient returns for followup of LAP-BAND placement December 12, 2010. She was last seen approximately a month ago. She is overall had poor weight loss to date but as we had discussed  previously she has not had adequate followup. We did a 1 cc fill at her last visit on August 9 to bring her up to 6-1/2 cc. She experienced a lot of increased restriction and initially was having trouble holding down solid food. She now however he is very appropriate restriction being able to be less than one cup of solid food and feels full with prolonged satiety. She is not having any significant vomiting at this point.  On exam today her weight is having the right direction being down 4 pounds since her last visit.  She is certainly adequately restricted at this point. We discussed the importance of routine exercise and she is trying to work back into this. We discussed diet choices which I think are appropriate at this time. Followup has been her main issue and we stressed that we need to see her back in 4 weeks.

## 2011-10-01 HISTORY — PX: TUMOR EXCISION: SHX421

## 2011-10-15 LAB — COMPREHENSIVE METABOLIC PANEL
ALT: 20
AST: 19
Albumin: 3.7
Alkaline Phosphatase: 51
BUN: 18
CO2: 26
Calcium: 9.6
Chloride: 104
Creatinine, Ser: 0.78
GFR calc Af Amer: >60
GFR calc non Af Amer: >60
Glucose, Bld: 84
Potassium: 3.9
Sodium: 140
Total Bilirubin: 0.7
Total Protein: 7.8

## 2011-10-15 LAB — CK TOTAL AND CKMB (NOT AT ARMC)
CK, MB: 1.5
CK, MB: 1.6
CK, MB: 1.7
Relative Index: INVALID
Relative Index: INVALID
Relative Index: INVALID
Total CK: 89
Total CK: 93
Total CK: 98

## 2011-10-15 LAB — BASIC METABOLIC PANEL
BUN: 13
BUN: 13
BUN: 18
CO2: 30
CO2: 30
CO2: 30
Calcium: 9.3
Calcium: 9.2
Calcium: 9.5
Chloride: 98
Chloride: 101
Chloride: 102
Creatinine, Ser: 1.01
Creatinine, Ser: 0.86
Creatinine, Ser: 0.9
GFR calc Af Amer: >60
GFR calc Af Amer: >60
GFR calc Af Amer: >60
GFR calc non Af Amer: 58 — ABNORMAL LOW
GFR calc non Af Amer: >60
GFR calc non Af Amer: >60
Glucose, Bld: 115 — ABNORMAL HIGH
Glucose, Bld: 119 — ABNORMAL HIGH
Glucose, Bld: 133 — ABNORMAL HIGH
Potassium: 3.8
Potassium: 3.5
Potassium: 3.9
Sodium: 136
Sodium: 138
Sodium: 139

## 2011-10-15 LAB — DIFFERENTIAL
Basophils Absolute: 0
Basophils Absolute: 0
Basophils Absolute: 0.1
Basophils Relative: 0
Basophils Relative: 0
Basophils Relative: 0
Eosinophils Absolute: 0.2
Eosinophils Absolute: 0.3
Eosinophils Absolute: 0.3
Eosinophils Relative: 1
Eosinophils Relative: 3
Eosinophils Relative: 3
Lymphocytes Relative: 8 — ABNORMAL LOW
Lymphocytes Relative: 24
Lymphocytes Relative: 31
Lymphs Abs: 1.5
Lymphs Abs: 3.1
Lymphs Abs: 3.7 — ABNORMAL HIGH
Monocytes Absolute: 0.5
Monocytes Absolute: 0.5
Monocytes Absolute: 0.5
Monocytes Relative: 3
Monocytes Relative: 4
Monocytes Relative: 5
Neutro Abs: 17.9 — ABNORMAL HIGH
Neutro Abs: 7.1
Neutro Abs: 8.5 — ABNORMAL HIGH
Neutrophils Relative %: 89 — ABNORMAL HIGH
Neutrophils Relative %: 61
Neutrophils Relative %: 68

## 2011-10-15 LAB — URINALYSIS, ROUTINE W REFLEX MICROSCOPIC
Bilirubin Urine: NEGATIVE
Glucose, UA: NEGATIVE
Hgb urine dipstick: NEGATIVE
Ketones, ur: NEGATIVE
Nitrite: NEGATIVE
Protein, ur: 30 — AB
Specific Gravity, Urine: 1.025
Urobilinogen, UA: 0.2
pH: 5.5

## 2011-10-15 LAB — CBC
HCT: 40.2
HCT: 36.9
HCT: 37.8
HCT: 39.1
Hemoglobin: 13.8
Hemoglobin: 12.7
Hemoglobin: 12.8
Hemoglobin: 13.2
MCHC: 34.2
MCHC: 33.7
MCHC: 33.9
MCHC: 34.5
MCV: 92.3
MCV: 91.9
MCV: 93.3
MCV: 94.2
Platelets: 335
Platelets: 304
Platelets: 311
Platelets: 312
RBC: 4.35
RBC: 4.01
RBC: 4.01
RBC: 4.2
RDW: 13.3
RDW: 13.3
RDW: 13.5
RDW: 13.6
WBC: 20.1 — ABNORMAL HIGH
WBC: 11.7 — ABNORMAL HIGH
WBC: 12.5 — ABNORMAL HIGH
WBC: 14.4 — ABNORMAL HIGH

## 2011-10-15 LAB — LIPID PANEL
Cholesterol: 185
HDL: 63
LDL Cholesterol: 77
Total CHOL/HDL Ratio: 2.9
Triglycerides: 225 — ABNORMAL HIGH
VLDL: 45 — ABNORMAL HIGH

## 2011-10-15 LAB — POCT I-STAT 3, ART BLOOD GAS (G3+)
Acid-Base Excess: 2
Bicarbonate: 27.7 — ABNORMAL HIGH
O2 Saturation: 90
Operator id: 250901
TCO2: 29
pCO2 arterial: 48.5 — ABNORMAL HIGH
pH, Arterial: 7.365
pO2, Arterial: 62 — ABNORMAL LOW

## 2011-10-15 LAB — POCT I-STAT 3, VENOUS BLOOD GAS (G3P V)
Acid-Base Excess: 2
Acid-base deficit: 4 — ABNORMAL HIGH
Bicarbonate: 23.8
Bicarbonate: 28.7 — ABNORMAL HIGH
O2 Saturation: 68
O2 Saturation: 71
Operator id: 250901
Operator id: 250901
TCO2: 25
TCO2: 30
pCO2, Ven: 51.5 — ABNORMAL HIGH
pCO2, Ven: 52.6 — ABNORMAL HIGH
pH, Ven: 7.274
pH, Ven: 7.345 — ABNORMAL HIGH
pO2, Ven: 40
pO2, Ven: 41

## 2011-10-15 LAB — TROPONIN I
Troponin I: 0.01
Troponin I: 0.01
Troponin I: 0.01

## 2011-10-15 LAB — HEPARIN LEVEL (UNFRACTIONATED): Heparin Unfractionated: 0.36

## 2011-10-15 LAB — HEMOGLOBIN A1C
Hgb A1c MFr Bld: 5.6
Mean Plasma Glucose: 122

## 2011-10-15 LAB — B-NATRIURETIC PEPTIDE (CONVERTED LAB): Pro B Natriuretic peptide (BNP): 35

## 2011-10-15 LAB — TSH: TSH: 1.964

## 2011-10-15 LAB — URINE MICROSCOPIC-ADD ON

## 2011-10-15 LAB — D-DIMER, QUANTITATIVE: D-Dimer, Quant: <0.22

## 2011-10-15 LAB — PROTIME-INR
INR: 1.1
Prothrombin Time: 14

## 2011-10-15 LAB — APTT: aPTT: 33

## 2011-11-08 ENCOUNTER — Encounter (INDEPENDENT_AMBULATORY_CARE_PROVIDER_SITE_OTHER): Payer: Medicare Other | Admitting: General Surgery

## 2011-11-30 ENCOUNTER — Ambulatory Visit (INDEPENDENT_AMBULATORY_CARE_PROVIDER_SITE_OTHER): Payer: Medicare PPO | Admitting: Physician Assistant

## 2011-11-30 ENCOUNTER — Encounter (INDEPENDENT_AMBULATORY_CARE_PROVIDER_SITE_OTHER): Payer: Self-pay

## 2011-11-30 VITALS — BP 138/60 | HR 68 | Temp 97.8°F | Resp 18 | Ht 60.0 in | Wt 277.2 lb

## 2011-11-30 DIAGNOSIS — Z4651 Encounter for fitting and adjustment of gastric lap band: Secondary | ICD-10-CM

## 2011-11-30 NOTE — Patient Instructions (Signed)
Take clear liquids for the next 48 hours. Thin protein shakes are ok to start on Saturday evening. Call us if you have persistent vomiting or regurgitation, night cough or reflux symptoms. Return as scheduled or sooner if you notice no changes in hunger/portion sizes.

## 2011-11-30 NOTE — Progress Notes (Signed)
  HISTORY: Sara Tran is a 55 y.o.female who received an AP-Large lap-band in December 2011 by Dr. Excell Seltzer. She complains of increased hunger, mostly because of a recent steroid injection. No vomiting/regurgiation.  VITAL SIGNS: Filed Vitals:   11/30/11 1548  BP: 138/60  Pulse: 68  Temp: 97.8 F (36.6 C)  Resp: 18    PHYSICAL EXAM: Physical exam reveals a very well-appearing 55 y.o.female in no apparent distress Neurologic: Awake, alert, oriented Psych: Bright affect, conversant Respiratory: Breathing even and unlabored. No stridor or wheezing Abdomen: Soft, nontender, nondistended to palpation. Incisions well-healed. No incisional hernias. Port easily palpated. Extremities: Atraumatic, good range of motion.  ASSESMENT: 55 y.o.  female  s/p AP-Large lap-band.   PLAN: The patient's port was accessed with a 20G Huber needle without difficulty. Clear fluid was aspirated and 0.5 mL saline was added to the port to give a total predicted volume of 7 mL. The patient was able to swallow water without difficulty following the procedure and was instructed to take clear liquids for the next 24-48 hours and advance slowly as tolerated.

## 2012-01-11 ENCOUNTER — Encounter (INDEPENDENT_AMBULATORY_CARE_PROVIDER_SITE_OTHER): Payer: Medicare PPO

## 2012-01-23 ENCOUNTER — Ambulatory Visit (INDEPENDENT_AMBULATORY_CARE_PROVIDER_SITE_OTHER): Payer: Medicare Other | Admitting: Gastroenterology

## 2012-01-23 ENCOUNTER — Encounter: Payer: Self-pay | Admitting: Gastroenterology

## 2012-01-23 DIAGNOSIS — K589 Irritable bowel syndrome without diarrhea: Secondary | ICD-10-CM | POA: Insufficient documentation

## 2012-01-23 DIAGNOSIS — R141 Gas pain: Secondary | ICD-10-CM

## 2012-01-23 DIAGNOSIS — R140 Abdominal distension (gaseous): Secondary | ICD-10-CM

## 2012-01-23 DIAGNOSIS — IMO0001 Reserved for inherently not codable concepts without codable children: Secondary | ICD-10-CM

## 2012-01-23 MED ORDER — HYOSCYAMINE SULFATE 0.125 MG SL SUBL: SUBLINGUAL_TABLET | SUBLINGUAL | Status: DC

## 2012-01-23 NOTE — Progress Notes (Signed)
Faxed to PCP

## 2012-01-23 NOTE — Patient Instructions (Signed)
SUBMIT STOOL STUDIES. USE GAS-X AS NEEDED FOR BLOATING. AVOID DAIRY. SEE HO BELOW. FOLLOW A FULL LIQUID DIET FOR THE NEXT 2-3 DAYS UNTIL GAS AND BLOATING IMPROVES. SEE INFO BELOW. USE HYOSCYAMINE AS NEEDED FOR DIARRHEA, AND CRAMPS. FOLLOW UP IN 4 MOS.  Full Liquid Diet  Breads and Starches  Allowed: None are allowed except crackers: WHOLE OR pureed (made into a thick, smooth soup) in soup. Cooked, refined corn, oat, rice, rye, and wheat cereals are also allowed.   Avoid: Any others.    Potatoes/Pasta/Rice  Allowed: ANY ITEM AS A SOUP OR SMALL PLATE OF MASHED POTATOES OR RICE.       Vegetables  Allowed: Strained tomato or vegetable juice. Vegetables pureed in soup.   Avoid: Any others.    Fruit  Allowed: Any strained fruit juices and fruit drinks. Include 1 serving of citrus or vitamin C-enriched fruit juice daily.   Avoid: Any others.  Meat and Meat Substitutes  Allowed: Egg  Avoid: Any meat, fish, or fowl. All cheese.  Milk  Allowed: SOY Milk beverages, including milk shakes and instant breakfast mixes. Smooth yogurt.   Avoid: Any others. Avoid dairy products if not tolerated.    Soups and Combination Foods  Allowed: Broth, strained cream soups. Strained, broth-based soups.   Avoid: Any others.    Desserts and Sweets  Allowed: flavored gelatin, tapioca, plain LACTOSE FREE ice cream, sherbet, smooth pudding, junket, fruit ices, frozen ice pops, pudding pops,, frozen fudge pops, chocolate syrup. Sugar, honey, jelly, syrup.   Avoid: Any others.  Fats and Oils  Allowed: Margarine, butter, cream, sour cream, oils.   Avoid: Any others.  Beverages  Allowed: All.   Avoid: None.  Condiments  Allowed: Iodized salt, pepper, spices, flavorings. Cocoa powder.   Avoid: Any others.    SAMPLE MEAL PLAN Breakfast   cup orange juice.   1 cup cooked wheat cereal.   1 cup SOY milk.   1 cup beverage (coffee or tea).   Cream or sugar, if desired.     Midmorning Snack  2 SCRAMBLED OR HARD BOILED EGG   Lunch  1 cup cream soup.    cup fruit juice.   1 cup SOY milk.    cup custard.   1 cup beverage (coffee or tea).   Cream or sugar, if desired.    Midafternoon Snack  1 cup VANILLA SOY milk shake.  Dinner  1 cup cream soup.    cup fruit juice.   1 cup SOY MILK milk.    cup pudding.   1 cup beverage (coffee or tea).       LACTOSE FREE DIET FOOD GROUP ALLOWED/RECOMMENDED AVOID/USE SPARINGLY  BREADS / STARCHES 4 servings or more* Breads and rolls made without milk. [place], [place], or [place] bread. Breads and rolls that contain milk. Prepared mixes such as muffins, biscuits, waffles, pancakes. Sweet rolls, donuts, [place] toast (if made with milk or lactose).  Crackers: Soda crackers, graham crackers. Any crackers prepared without lactose. Zwieback crackers, corn curls, or any that contain lactose.  Cereals: Cooked or dry cereals prepared without lactose (read labels). Cooked or dry cereals prepared with lactose (read labels). Total, Cocoa Krispies. Special K.  Potatoes / Pasta / Rice: Any prepared without milk or lactose. Popcorn. Instant potatoes, frozen [place] fries, scalloped or au gratin potatoes.  VEGETABLES 2 servings or more Fresh, frozen, and canned vegetables. Creamed or breaded vegetables. Vegetables in a cheese sauce or with lactose-containing margarines.  FRUIT 2 servings or  more All fresh, canned, or frozen fruits that are not processed with lactose. Any canned or frozen fruits processed with lactose.  MEAT & SUBSTITUTES 2 servings or more (4 to 6 oz. total per day) Plain beef, chicken, fish, [place], lamb, veal, pork, or ham. Kosher prepared meat products. Strained or junior meats that do not contain milk. Eggs, soy meat substitutes, nuts. Scrambled eggs, omelets, and souffles that contain milk. Creamed or breaded meat, fish, or fowl. Sausage products such as wieners, liver sausage, or cold  cuts that contain milk solids. Cheese, cottage cheese, or cheese spreads.  MILK None. (See "BEVERAGES" for milk substitutes. See "DESSERTS" for ice cream and frozen desserts.) Milk (whole, 2%, skim, or chocolate). Evaporated, powdered, or condensed milk; malted milk.  SOUPS & COMBINATION FOODS Bouillon, broth, vegetable soups, clear soups, consomms. Homemade soups made with allowed ingredients. Combination or prepared foods that do not contain milk or milk products (read labels). Cream soups, chowders, commercially prepared soups containing lactose. Macaroni and cheese, pizza. Combination or prepared foods that contain milk or milk products.  DESSERTS & SWEETS In moderation Water and fruit ices; gelatin; angel food cake. Homemade cookies, pies, or cakes made from allowed ingredients. Pudding (if made with water or a milk substitute). Lactose-free tofu desserts. Sugar, honey, corn syrup, jam, jelly; marmalade; molasses (beet sugar); Pure sugar candy; marshmallows. Ice cream, ice milk, sherbet, custard, pudding, frozen yogurt. Commercial cake and cookie mixes. Desserts that contain chocolate. Pie crust made with milk-containing margarine; reduced-calorie desserts made with a sugar substitute that contains lactose. Toffee, peppermint, butterscotch, chocolate, caramels.  FATS & OILS In moderation Butter (as tolerated; contains very small amounts of lactose). Margarines and dressings that do not contain milk, Vegetable oils, shortening, Miracle Whip, mayonnaise, nondairy cream & whipped toppings without lactose or milk solids added (examples: Coffee Rich, Carnation Coffeemate, Rich's Whipped Topping, PolyRich). Berniece Salines. Margarines and salad dressings containing milk; cream, cream cheese; peanut butter with added milk solids, sour cream, chip dips, made with sour cream.  BEVERAGES Carbonated drinks; tea; coffee and freeze-dried coffee; some instant coffees (check labels). Fruit drinks; fruit and vegetable juice;  [place]ice or Soy milk. Ovaltine, hot chocolate. Some cocoas; some instant coffees; instant iced teas; powdered fruit drinks (read labels).   CONDIMENTS / MISCELLANEOUS Soy sauce, carob powder, olives, gravy made with water, baker's cocoa, pickles, pure seasonings and spices, wine, pure monosodium glutamate, catsup, mustard. Some chewing gums, chocolate, some cocoas. Certain antibiotics and vitamin / mineral preparations. Spice blends if they contain milk products. MSG extender. Artificial sweeteners that contain lactose such as Equal (Nutra-Sweet) and Sweet 'n Low. Some nondairy creamers (read labels).  SAMPLE MENU*  Breakfast   Orange Juice.  Banana.   Bran flakes.   Nondairy Creamer.  [place] Bread (toasted).   Butter or milk-free margarine.   Coffee or tea.    Noon Meal   Chicken Breast.  Rice.   Green beans.   Butter or milk-free margarine.  Fresh melon.   Coffee or tea.    Evening Meal   Roast Beef.  Baked potato.   Butter or milk-free margarine.   Broccoli.   Lettuce salad with vinegar and oil dressing.  [employer]   Coffee or tea.

## 2012-01-23 NOTE — Assessment & Plan Note (Signed)
Sx not ideally controlled. Pt out of Levsin. Having intermittent diarrhea, doubt CDIFF or inflammatory process. Sx sound c/w IBS-MIXED.   SUBMIT STOOL STUDIES. USE GAS-X AS NEEDED FOR BLOATING. AVOID DAIRY. SEE HO BELOW. FOLLOW A FULL LIQUID DIET FOR THE NEXT 2-3 DAYS UNTIL GAS AND BLOATING IMPROVES. SEE INFO BELOW. USE HYOSCYAMINE AS NEEDED FOR DIARRHEA, AND CRAMPS. FOLLOW UP IN 4 MOS.

## 2012-01-23 NOTE — Progress Notes (Signed)
Subjective:    Patient ID: Sara Tran, female    DOB: 09-Nov-1956, 56 y.o.   MRN: [id number]  PCP: GOLDING  HPI HAD LAP BAND SURGERY DEC 2011-lost 8 lbs. Has pain in right side. One day has soft stools and then constipated then watery stools. Gets gas then "no bowel sounds" and bloating. Has rectal pressure.  NEEDS PRESCRIPTION FOR HYOSCYAMINE. TAKES IT PRN. GETS SPASMS OF HER ESOPHAGUS. LEFT HH. TOOK HER 6 MOS TO HEAL HER INCISIONS. No [id number]. No fever or chills. Bms: most 3-5(watery/soft/hard). Can feel the need to strain.  TAKING A PROBIOTIC LIQUID FORM MEXICANS.   Past Medical History  Diagnosis Date  . CHF (congestive heart failure)   . Diabetes mellitus   . Sleep apnea   . Hypertension     pulmonary  . Morbid obesity   . Asthma   . Arthritis   . Hypertriglyceridemia   . COPD (chronic obstructive pulmonary disease)     Past Surgical History  Procedure Date  . Laparoscopic cholecystectomy 1985  . Cardiac catheterization 2008    w/o coronary artery disease  . Laparoscopic gastric banding 2011  . Pilonidal cyst excision 1974  . Tumor excision 10/2011    left thigh  . Tumor excision 10/2011    on face    Allergies  Allergen Reactions  . Aspirin     Rectal bleeding  . Ciprofloxacin     Coughed up blood    Current Outpatient Prescriptions  Medication Sig Dispense Refill  . albuterol (PROVENTIL) (2.5 MG/3ML) 0.083% nebulizer solution Take 2.5 mg by nebulization every 6 (six) hours as needed.        . fenofibrate 160 MG tablet Take 160 mg by mouth daily.        . fluticasone (FLONASE) 50 MCG/ACT nasal spray Place 2 sprays into the nose daily.        . hydrochlorothiazide 25 MG tablet Take 25 mg by mouth daily.        Marland Kitchen HYDROcodone-acetaminophen (VICODIN) 5-500 MG per tablet Ad lib.      . isosorbide dinitrate (ISORDIL) 30 MG tablet Take 30 mg by mouth 4 (four) times daily.        . isosorbide mononitrate (IMDUR) 30 MG 24 hr tablet Take 30 mg by mouth daily.        Marland Kitchen loratadine (CLARITIN) 10 MG tablet Take 10 mg by mouth daily.        Marland Kitchen losartan (COZAAR) 100 MG tablet Take 100 mg by mouth daily.        . metFORMIN (GLUMETZA) 500 MG (MOD) 24 hr tablet Take 500 mg by mouth daily with breakfast.       . potassium chloride SA (K-DUR,KLOR-CON) 20 MEQ tablet Take 20 mEq by mouth 2 (two) times daily.        . potassium chloride (KLOR-CON) 20 MEQ packet Take 20 mEq by mouth 2 (two) times daily.            Review of Systems     Objective:   Physical Exam  Vitals reviewed. Constitutional: She is oriented to person, place, and time. No distress.  HENT:  Head: Normocephalic.  Mouth/Throat: No oropharyngeal exudate.  Eyes: Pupils are equal, round, and reactive to light. No scleral icterus.  Neck: Normal range of motion. Neck supple.  Cardiovascular: Normal rate, regular rhythm and normal heart sounds.   Pulmonary/Chest: Effort normal and breath sounds normal. No respiratory distress.  Abdominal: Bowel sounds are normal. She  exhibits distension (MILD). There is tenderness (MILD TTP IN RUQ ALONG INCISIONS, & ABOVE INCISION). There is no rebound and no guarding.  Musculoskeletal:       ABLE TO CLIMB ONTO EXAM TABLE WITHOUT DIFFICULTY  Neurological: She is alert and oriented to person, place, and time.       NO FOCAL DEFICITS           Assessment & Plan:

## 2012-01-30 NOTE — Progress Notes (Signed)
Reminder in epic to follow up in 4 months °

## 2012-02-21 ENCOUNTER — Encounter (INDEPENDENT_AMBULATORY_CARE_PROVIDER_SITE_OTHER): Payer: Medicare PPO

## 2012-02-22 ENCOUNTER — Encounter (INDEPENDENT_AMBULATORY_CARE_PROVIDER_SITE_OTHER): Payer: Medicare PPO

## 2012-02-26 ENCOUNTER — Other Ambulatory Visit: Payer: Self-pay

## 2012-02-26 DIAGNOSIS — R0602 Shortness of breath: Secondary | ICD-10-CM

## 2012-03-05 ENCOUNTER — Encounter (HOSPITAL_COMMUNITY): Payer: Medicare Other

## 2012-03-12 ENCOUNTER — Ambulatory Visit (HOSPITAL_COMMUNITY)
Admission: RE | Admit: 2012-03-12 | Discharge: 2012-03-12 | Disposition: A | Discharge status: Home / Self Care | Payer: Medicare HMO | Source: Ambulatory Visit | Attending: Pulmonary Disease | Admitting: Pulmonary Disease

## 2012-03-12 ENCOUNTER — Encounter: Payer: Self-pay | Admitting: Internal Medicine

## 2012-03-12 DIAGNOSIS — R0602 Shortness of breath: Secondary | ICD-10-CM | POA: Insufficient documentation

## 2012-03-12 LAB — BLOOD GAS, ARTERIAL
Acid-Base Excess: 4.2 mmol/L — ABNORMAL HIGH (ref 0.0–2.0)
Bicarbonate: 28.4 mEq/L — ABNORMAL HIGH (ref 20.0–24.0)
O2 Saturation: 94.6 %
Patient temperature: 37
TCO2: 25 mmol/L (ref 0–100)
pCO2 arterial: 43.6 mmHg (ref 35.0–45.0)
pH, Arterial: 7.429 — ABNORMAL HIGH (ref 7.350–7.400)
pO2, Arterial: 71.8 mmHg — ABNORMAL LOW (ref 80.0–100.0)

## 2012-03-12 LAB — PULMONARY FUNCTION TEST

## 2012-03-14 NOTE — Procedures (Signed)
Sara Tran, PILSON                ACCOUNT NO.:  [id number]  MEDICAL RECORD NO.:  [id number]  LOCATION:                                 FACILITY:  PHYSICIAN:  Latravious Levitt L. [name], M.D.DATE OF BIRTH:  September 19, 1956  DATE OF PROCEDURE: DATE OF DISCHARGE:                           PULMONARY FUNCTION TEST   Reason for pulmonary function testing is shortness of breath. 1. Spirometry shows no ventilatory defect and no definite airflow     obstruction. 2. Lung volumes are normal. 3. DLCO is minimally reduced, but is corrected to some extent when     considerations of volume are made. 4. Airway resistance is normal.     [name] L. [name], M.D.     ELH/MEDQ  D:  03/13/2012  T:  03/13/2012  Job:  [id number]

## 2012-03-27 ENCOUNTER — Ambulatory Visit (HOSPITAL_COMMUNITY): Payer: Medicare HMO

## 2012-04-16 ENCOUNTER — Ambulatory Visit (INDEPENDENT_AMBULATORY_CARE_PROVIDER_SITE_OTHER): Payer: Medicare HMO | Admitting: General Surgery

## 2012-04-16 ENCOUNTER — Encounter (INDEPENDENT_AMBULATORY_CARE_PROVIDER_SITE_OTHER): Payer: Self-pay | Admitting: General Surgery

## 2012-04-16 NOTE — Progress Notes (Signed)
Chief complaint: Followup lap band  History: Patient returns for followup of lap band placed in December 2011. She was last here in November 2012 point and he did a half cc fill. Her overall impression is that she needs another fill. Her weight is up 6 pounds since November and she has only lost 2 pounds since surgery. On questioning her about her diet it is a little difficult to get a feel for her degree of restriction. She states that she will often have to vomiting in the morning when she first eats which she feels is mucous that is built up in her esophagus overnight. She then had her states that in the afternoon and evening she is eating quite a bit. On questioning her however she states this is usually soft food because she is able to eat more. She says she is able to eat solid protein such as chicken or fish but prefers the soft food because she can be more. She denies reflux or vomiting during the afternoon or evening. She has had some ongoing health concerns with apparently worsening heart failure and pulmonary hypertension I do not have these records today. She has comorbidities preoperatively of pulmonary hypertension, obstructive sleep apnea and diabetes. She states that she is requiring left diabetic medication and is a smaller size despite her lack of weight loss.  Exam: BP 147/82  Pulse 88  Temp(Src) 97.4 F (36.3 C) (Temporal)  Ht 5' (1.524 m)  Wt 283 lb 6.4 oz (128.549 kg)  BMI 55.35 kg/m2  SpO2 96% Gen.: Morbidly obese female in no distress Abdomen: Soft and nontender. Port site incisions look fine.  Assessment and plan: Status post lap band with very poor weight loss. I spent a long time discussing with her her eating habits and still is not clear to me if she has maladaptive eating due to over restriction or if she is under restricted. I think she does not have a good insight to this point about the LAP-BAND diet for what to expect. She definitely wanted a fill today and we went  ahead with a 1/2 cc fill to bring her up to 7.5 cc. She was able to tolerate water well. I clearly think she needs a refresh her course with the dietitian to go over the LAP-BAND diet and we will try to arrange this for her. I am also going to get a barium swallow to assess the LAP-BAND for some objective evidence of restriction to guide [place]. I will call her with the results and I gave her a default appointment for 2 months.

## 2012-04-18 ENCOUNTER — Telehealth (INDEPENDENT_AMBULATORY_CARE_PROVIDER_SITE_OTHER): Payer: Self-pay | Admitting: General Surgery

## 2012-04-18 NOTE — Telephone Encounter (Signed)
Stacy at [facility] in [place] needs orders for barium swallow either e-signed, or a signed order FAXd to them.  Pt coming in on Monday for test.

## 2012-04-21 ENCOUNTER — Other Ambulatory Visit (HOSPITAL_COMMUNITY): Payer: Self-pay | Admitting: Nephrology

## 2012-04-21 ENCOUNTER — Ambulatory Visit (HOSPITAL_COMMUNITY): Payer: Medicare HMO

## 2012-04-21 DIAGNOSIS — N289 Disorder of kidney and ureter, unspecified: Secondary | ICD-10-CM

## 2012-04-22 ENCOUNTER — Encounter: Payer: Medicare HMO | Attending: General Surgery | Admitting: *Deleted

## 2012-04-22 ENCOUNTER — Ambulatory Visit (HOSPITAL_COMMUNITY)
Admission: RE | Admit: 2012-04-22 | Discharge: 2012-04-22 | Disposition: A | Discharge status: Home / Self Care | Payer: Medicare HMO | Source: Ambulatory Visit | Attending: Internal Medicine | Admitting: Internal Medicine

## 2012-04-22 ENCOUNTER — Encounter (INDEPENDENT_AMBULATORY_CARE_PROVIDER_SITE_OTHER): Payer: Self-pay | Admitting: General Surgery

## 2012-04-22 ENCOUNTER — Encounter (HOSPITAL_COMMUNITY): Payer: Self-pay

## 2012-04-22 ENCOUNTER — Encounter: Payer: Self-pay | Admitting: *Deleted

## 2012-04-22 ENCOUNTER — Ambulatory Visit (INDEPENDENT_AMBULATORY_CARE_PROVIDER_SITE_OTHER): Payer: Medicare HMO | Admitting: General Surgery

## 2012-04-22 VITALS — BP 128/74 | HR 91 | Wt 281.8 lb

## 2012-04-22 DIAGNOSIS — Z9884 Bariatric surgery status: Secondary | ICD-10-CM | POA: Insufficient documentation

## 2012-04-22 DIAGNOSIS — I2789 Other specified pulmonary heart diseases: Secondary | ICD-10-CM | POA: Insufficient documentation

## 2012-04-22 DIAGNOSIS — R1310 Dysphagia, unspecified: Secondary | ICD-10-CM

## 2012-04-22 DIAGNOSIS — R1319 Other dysphagia: Secondary | ICD-10-CM

## 2012-04-22 DIAGNOSIS — Z713 Dietary counseling and surveillance: Secondary | ICD-10-CM | POA: Insufficient documentation

## 2012-04-22 DIAGNOSIS — Z09 Encounter for follow-up examination after completed treatment for conditions other than malignant neoplasm: Secondary | ICD-10-CM | POA: Insufficient documentation

## 2012-04-22 DIAGNOSIS — R1314 Dysphagia, pharyngoesophageal phase: Secondary | ICD-10-CM

## 2012-04-22 DIAGNOSIS — R079 Chest pain, unspecified: Secondary | ICD-10-CM | POA: Insufficient documentation

## 2012-04-22 HISTORY — DX: Irritable bowel syndrome, unspecified: K58.9

## 2012-04-22 NOTE — Assessment & Plan Note (Addendum)
Volume status appears good today.  I feel her dyspnea is multifactorial dues to obesity, COPD and pulmonary hypertension as well as possibility of ischemic etiology.  At this time have recommended she proceed with R and L heart cath for definitive diagnosis.  Risk, benefits and indications discussed, she is hesitant but agrees to proceed.  She states she has a barium swallow and colonoscopy scheduled so she will need to call back and scheduled procedure.      Patient seen and examined with Leone Haven, PA-C. We discussed all aspects of the encounter. I agree with the assessment and plan as stated above.  I suspect Sara Tran's dyspnea is primarily related to her obesity and diastolic dysfunction. However, she does have RFs for CAD and is having reproducible CP with activity relieved with NTG. Thus, at this point, I feel the best plan of action is to proceed with R and L heart cath to assess her coronaries and also measure her pulmonary pressures directly. We discussed the need for weight loss and treatment of OSA. Further recommendations based on results of cath.

## 2012-04-22 NOTE — Patient Instructions (Signed)
Continue current medications.  Your physician has requested that you have a cardiac catheterization. Cardiac catheterization is used to diagnose and/or treat various heart conditions. Doctors may recommend this procedure for a number of different reasons. The most common reason is to evaluate chest pain. Chest pain can be a symptom of coronary artery disease (CAD), and cardiac catheterization can show whether plaque is narrowing or blocking your heart's arteries. This procedure is also used to evaluate the valves, as well as measure the blood flow and oxygen levels in different parts of your heart. For further information please visit [email or web address]. Please follow instruction sheet, as given.  Call back with dates for heart catheterization.

## 2012-04-22 NOTE — Patient Instructions (Addendum)
Goals:  Follow Phase 3B: High Protein + Non-Starchy Vegetables  Eat 3-6 small meals/snacks, every 3-5 hrs  Increase lean protein foods to meet 60g goal (per MD approval)  Increase fluid intake to 64oz +  Add 15 grams of carbohydrate (fruit, whole grain, starchy vegetable) with meals  Avoid drinking 15 minutes before, during and 30 minutes after eating  Aim for >30 min of physical activity daily  Per MD approval, try B12 (sublingual).

## 2012-04-22 NOTE — Progress Notes (Addendum)
  Follow-up visit:  16 Months Post-Operative LAGB Surgery  Medical Nutrition Therapy:  Appt start time: 1130 end time:  1200.  Primary concerns today: Post-operative bariatric surgery nutrition management. Sara Tran is here today for f/u.  Has not returned since 2 month f/u with AB. Reports many health problems including pulmonary HTN, which keep her from exercising. Unable to obtain dietary recall from pt. States she had a fill last week and cannot keep anything down. Reports severe hypoglycemic episodes over last few months. Checks BGs 1-3 times/day. Recent fasting - 105 mg; 2 hr post-prandial - 126 mg. Advised to notify MD. Not taking supplements because they "cause constipation".    Surgery date: 12/11/10 [facility] weight at [facility]: 290.9 lbs  Weight today: 283.8 lbs Weight change: + 5.1 lbs Total weight lost: 7.1 lbs BMI: 55.4  24-hr recall:   B (AM): Oatmeal or 4 oz milk Snk ( AM): Egg w/ 1.5 pc [place] bacon (vomited whole thing)  Fluid intake: >60 oz (per pt) Estimated total protein intake: Unknown - Pt states she is "getting enough"  Medications: See medication list.  Supplementation: None  CBG monitoring: daily (1-3 times/day) Average CBG per patient: 105-126 mg Last patient reported A1c: 6.2% (Jan 2013)  Using straws: No Drinking while eating: No Hair loss: No Carbonated beverages: No N/V/D/C: Vomited egg and [place] bacon; thinks portions too large Dumping syndrome: None reported Last Lap-Band fill:  04/16/12; Plans to visit surgeon today to have fluid removed.  Recent physical activity:  None  Progress Towards Goal(s):  In progress.  Nutritional Diagnosis:  [place]-3.3 Overweight/obesity related to previous LAGB surgery as evidenced by patient attempting to follow post-op nutrition guidelines to resume weight loss.    Intervention:  Nutrition education/reinforcement.  Samples Dispensed:   Celebrate B-12 Sublingual ([employer]): 1 ea Lot # [id number]; Exp: 07/14  Bariatric  Advantage B-12 Subl (Peppermint): 2 ea Lot # [id number]; Exp: 05/13  Monitoring/Evaluation:  Dietary intake, exercise, lap band fills, and body weight. Follow up in 3 months.

## 2012-04-22 NOTE — Progress Notes (Signed)
Subjective:     Patient ID: Sara Tran, female   DOB: 1956-02-14, 56 y.o.   MRN: [id number]  HPI This patient follows up 6 days status post lap band fill. She says that she throws up after nearly every meal. She does feel that things are getting back up and her chest even the mucus seems to be backing up causing her to vomit. She does feel that things are getting stuck and she can only keep down 2 ounces of anything. She does not have any reflux or cough other than when she feels as though she is blocked.  Review of Systems     Objective:   Physical Exam No acute distress and nontoxic-appearing  Her port was accessed on the first attempt and aspirated 1 cc of fluid. I measured a total of 8.5 cc in the band prior to aspirating 1 cc for a total of 7.5 cc left in the band.    Assessment:     Dysphasia due to lab band I aspirated one 1.0 cc from her band and she says she felt much better and was able to drink without difficulties. I agree with upper GI to evaluate her band as it sounds like she really has not had any success with this from the beginning. I recommended that she follow up with Dr. Excell Seltzer in 4 weeks after her upper GI.    Plan:     UGI and f/u in 4 weeks or sooner as needed.  Post band fill diet

## 2012-04-22 NOTE — Assessment & Plan Note (Signed)
As above, concerning for ischemia will schedule left heart cath.  She will continue use of nitro in the interim, if episodes become more frequent or not relieved with nitro she will call EMS.

## 2012-04-22 NOTE — Progress Notes (Addendum)
Referring Physician: Dr. [name] Primary Care: Dr. Hilma Favors Primary Cardiologist: none Nephrologist: Dr. Rayetta Pigg   HPI:  Sara Tran is a 56 y.o. African American female with history of pulmonary hypertension, diabetes, HF, COPD, sleep apnea with CPAP, elevated triglycerides for 15 years and renal insufficiency.   Obesity s/p lap band in 2011.  Also history of IBS and arthritis.  She has been referred for by Dr. [name] for further work up of her dyspnea and pulmonary hypertension.   VQ scan 2008: normal  RHC 2008:  RA pressures 13/10 with a mean of 7 mmHg.  RV pressure 123456 with end diastolic pressure of 15 mmHg.  PA pressure 49/23 with a mean of 35 mmHg.  Pulmonary capillary wedge 17/50 with a mean of 14 mmHg. The PA saturation was 68%.  RA saturation was 71% and aortic saturation was 90%.  Cardiac output was 6.0 with a cardiac index of 2.80 by Fick.  Normal Coronaries per [facility] 2008.  Dr. [name] performed PFTs on 03/13/12, report reads Spirometry shows no ventilatory defect and no definite airflow obstruction.  Lung volumes are normal. DLCO is minimally reduced, but is corrected to some extent when considerations of volume are made.   Airway resistance is normal.   FVC 1.91 (81%), FEV1 (88%), FEV1/FVC 87 (107%), FEF 25-75% 2.09 (103%), DLCO 75%   She feels pretty good today.  She has good days and bad.  Some days she has to use a walker to get around because she says her feet swell.  She also has chest pain with some activities about once a week.  Chest pain left sided that radiates down left arm usually resolves with 2 nitro.  Chronic 3 pillow orthopnea.  Intermittent lower extremity edema.  No dizziness/syncope.   Feels she has a lot of fluid in her abdomen.      Review of Systems: [y] = yes, [ ]  = no   General: Weight gain [y]; Weight loss [ ] ; Anorexia [ ] ; Fatigue [ ] ; Fever [ ] ; Chills [ ] ; Weakness [ ]   Cardiac: Chest pain/pressure [y]; Resting SOB [ ] ; Exertional SOB [y]; Orthopnea  [y]; Pedal Edema [y]; Palpitations [y]; Syncope [ ] ; Presyncope [ ] ; Paroxysmal nocturnal dyspnea[ ]   Pulmonary: Cough [y]; Wheezing[y]; Hemoptysis[ ] ; Sputum [ ] ; Snoring [ ]   GI: Vomiting[ ] ; Dysphagia[ ] ; Melena[ ] ; Hematochezia [ ] ; Heartburn[ ] ; Abdominal pain [y]; Constipation [y]; Diarrhea [y]; BRBPR [ ]   GU: Hematuria[ ] ; Dysuria [ ] ; Nocturia[ ]   Vascular: Pain in legs with walking [y]; Pain in feet with lying flat [y]; Non-healing sores [ ] ; Stroke [ ] ; TIA [ ] ; Slurred speech [ ] ;  Neuro: Headaches[y]; Vertigo[ ] ; Seizures[ ] ; Paresthesias[ ] ;Blurred vision [ ] ; Diplopia [ ] ; Vision changes [ ]   Ortho/Skin: Arthritis [ \y]; Joint pain [y]; Muscle pain [y]; Joint swelling [ ] ; Back Pain [ ] ; Rash [ ]   Psych: Depression[ ] ; Anxiety[y]  Heme: Bleeding problems [ ] ; Clotting disorders [ ] ; Anemia [ ]   Endocrine: Diabetes [y]; Thyroid dysfunction[ ]    Past Medical History  Diagnosis Date  . CHF (congestive heart failure)     diastolic  . Diabetes mellitus   . Sleep apnea     CPAP use  . Hypertension     pulmonary  . Morbid obesity   . Asthma   . Arthritis   . Hypertriglyceridemia   . COPD (chronic obstructive pulmonary disease)   . IBS (irritable bowel syndrome)  Current Outpatient Prescriptions  Medication Sig Dispense Refill  . albuterol (PROVENTIL HFA;VENTOLIN HFA) 108 (90 BASE) MCG/ACT inhaler Inhale 2 puffs into the lungs every 6 (six) hours as needed.      . beclomethasone (QVAR) 80 MCG/ACT inhaler Inhale 2 puffs into the lungs 2 (two) times daily as needed.      . fenofibrate 160 MG tablet Take 160 mg by mouth daily.        . fluticasone (FLONASE) 50 MCG/ACT nasal spray Place 2 sprays into the nose daily.        . hydrochlorothiazide 25 MG tablet Take 25 mg by mouth daily.        Marland Kitchen HYDROcodone-acetaminophen (VICODIN) 5-500 MG per tablet Ad lib.      Marland Kitchen Hyoscyamine Sulfate (HYOSYNE PO) Take 0.125 tablets by mouth as needed.      . isosorbide mononitrate (IMDUR) 30  MG 24 hr tablet Take 30 mg by mouth daily.       Marland Kitchen loratadine (CLARITIN) 10 MG tablet Take 10 mg by mouth daily.        Marland Kitchen losartan (COZAAR) 100 MG tablet Take 100 mg by mouth daily.        . metFORMIN (GLUMETZA) 500 MG (MOD) 24 hr tablet Take 500 mg by mouth daily with breakfast.       . potassium chloride SA (K-DUR,KLOR-CON) 20 MEQ tablet Take 20 mEq by mouth 2 (two) times daily.        . hyoscyamine (LEVSIN SL) 0.125 MG SL tablet 1-2 sl as needed abd pain or diarrhea. May repeat every 4-6 hours. No more than 8 tabs a day.  #60 x 5  60 tablet  11    Allergies  Allergen Reactions  . Aspirin     Rectal bleeding  . Ciprofloxacin     Coughed up blood  . Latex     History   Social History  . Marital Status: Single    Spouse Name: N/A    Number of Children: 0  . Years of Education: N/A   Occupational History  . Volunteers   . 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 Smoking status: Former Smoker    Quit date: 08/08/1996  . Smokeless tobacco: Never Used  . Alcohol Use: No  . Drug Use: No  . Sexually Active: Not on file   Other Topics Concern  . Not on file   Social History Narrative  . No narrative on file  Lives home by herself.   Family History  Problem Relation Age of Onset  . Breast cancer Sister   . Breast cancer Paternal Aunt   . Diabetes Mother   . Heart failure Mother     PHYSICAL EXAM: Filed Vitals:   04/22/12 1517  BP: 128/74  Pulse: 91  Weight: 281 lb 12 oz (127.801 kg)  SpO2: 96%    General:  Obese, Well appearing. No respiratory difficulty, mild kyphosis  HEENT: normal Neck: supple. JVP very difficult to assess due to neck habitus. Carotids 2+ bilat; no bruits. No lymphadenopathy or thryomegaly appreciated. Cor: Distant Regular rate & rhythm. No rubs, gallops or murmurs. Lungs: clear Abdomen: obese, soft, nontender, nondistended. No hepatosplenomegaly. No bruits or masses. Good bowel  sounds. Extremities: no cyanosis, clubbing, rash, edema Neuro: alert & oriented x 3, cranial nerves grossly intact. moves all 4 extremities w/o difficulty. Affect pleasant.   ASSESSMENT & PLAN:

## 2012-04-28 ENCOUNTER — Ambulatory Visit (HOSPITAL_COMMUNITY)
Admission: RE | Admit: 2012-04-28 | Discharge: 2012-04-28 | Disposition: A | Discharge status: Home / Self Care | Payer: Medicare HMO | Source: Ambulatory Visit | Attending: General Surgery | Admitting: General Surgery

## 2012-04-28 ENCOUNTER — Ambulatory Visit (HOSPITAL_COMMUNITY)
Admission: RE | Admit: 2012-04-28 | Discharge: 2012-04-28 | Disposition: A | Discharge status: Home / Self Care | Payer: Medicare HMO | Source: Ambulatory Visit | Attending: Nephrology | Admitting: Nephrology

## 2012-04-28 DIAGNOSIS — Z9884 Bariatric surgery status: Secondary | ICD-10-CM | POA: Insufficient documentation

## 2012-04-28 DIAGNOSIS — N289 Disorder of kidney and ureter, unspecified: Secondary | ICD-10-CM

## 2012-04-28 DIAGNOSIS — R1110 Vomiting, unspecified: Secondary | ICD-10-CM | POA: Insufficient documentation

## 2012-05-13 LAB — PULMONARY FUNCTION TEST

## 2012-06-02 ENCOUNTER — Other Ambulatory Visit: Payer: Self-pay

## 2012-06-02 ENCOUNTER — Emergency Department (HOSPITAL_COMMUNITY): Payer: Medicare HMO

## 2012-06-02 ENCOUNTER — Encounter (HOSPITAL_COMMUNITY): Payer: Self-pay | Admitting: *Deleted

## 2012-06-02 ENCOUNTER — Inpatient Hospital Stay (HOSPITAL_COMMUNITY)
Admission: EM | Admit: 2012-06-02 | Discharge: 2012-06-03 | DRG: 155 | Disposition: A | Discharge status: Home / Self Care | Payer: Medicare HMO | Source: Ambulatory Visit | Attending: Internal Medicine | Admitting: Internal Medicine

## 2012-06-02 DIAGNOSIS — I509 Heart failure, unspecified: Secondary | ICD-10-CM | POA: Diagnosis present

## 2012-06-02 DIAGNOSIS — I5033 Acute on chronic diastolic (congestive) heart failure: Secondary | ICD-10-CM | POA: Insufficient documentation

## 2012-06-02 DIAGNOSIS — Z9884 Bariatric surgery status: Secondary | ICD-10-CM

## 2012-06-02 DIAGNOSIS — I2789 Other specified pulmonary heart diseases: Secondary | ICD-10-CM | POA: Diagnosis present

## 2012-06-02 DIAGNOSIS — E781 Pure hyperglyceridemia: Secondary | ICD-10-CM | POA: Diagnosis present

## 2012-06-02 DIAGNOSIS — R079 Chest pain, unspecified: Secondary | ICD-10-CM

## 2012-06-02 DIAGNOSIS — G4730 Sleep apnea, unspecified: Secondary | ICD-10-CM

## 2012-06-02 DIAGNOSIS — J4489 Other specified chronic obstructive pulmonary disease: Secondary | ICD-10-CM | POA: Diagnosis present

## 2012-06-02 DIAGNOSIS — G4733 Obstructive sleep apnea (adult) (pediatric): Secondary | ICD-10-CM | POA: Insufficient documentation

## 2012-06-02 DIAGNOSIS — Z833 Family history of diabetes mellitus: Secondary | ICD-10-CM

## 2012-06-02 DIAGNOSIS — Z803 Family history of malignant neoplasm of breast: Secondary | ICD-10-CM

## 2012-06-02 DIAGNOSIS — Z87891 Personal history of nicotine dependence: Secondary | ICD-10-CM

## 2012-06-02 DIAGNOSIS — Z886 Allergy status to analgesic agent status: Secondary | ICD-10-CM

## 2012-06-02 DIAGNOSIS — Z881 Allergy status to other antibiotic agents status: Secondary | ICD-10-CM

## 2012-06-02 DIAGNOSIS — I200 Unstable angina: Secondary | ICD-10-CM

## 2012-06-02 DIAGNOSIS — J449 Chronic obstructive pulmonary disease, unspecified: Secondary | ICD-10-CM | POA: Diagnosis present

## 2012-06-02 DIAGNOSIS — M129 Arthropathy, unspecified: Secondary | ICD-10-CM | POA: Diagnosis present

## 2012-06-02 DIAGNOSIS — R0600 Dyspnea, unspecified: Secondary | ICD-10-CM

## 2012-06-02 DIAGNOSIS — N289 Disorder of kidney and ureter, unspecified: Secondary | ICD-10-CM | POA: Diagnosis present

## 2012-06-02 DIAGNOSIS — E119 Type 2 diabetes mellitus without complications: Secondary | ICD-10-CM | POA: Diagnosis present

## 2012-06-02 DIAGNOSIS — I5032 Chronic diastolic (congestive) heart failure: Secondary | ICD-10-CM | POA: Diagnosis present

## 2012-06-02 DIAGNOSIS — I272 Pulmonary hypertension, unspecified: Secondary | ICD-10-CM | POA: Insufficient documentation

## 2012-06-02 DIAGNOSIS — K589 Irritable bowel syndrome without diarrhea: Secondary | ICD-10-CM | POA: Diagnosis present

## 2012-06-02 DIAGNOSIS — E669 Obesity, unspecified: Secondary | ICD-10-CM | POA: Diagnosis present

## 2012-06-02 DIAGNOSIS — Z91040 Latex allergy status: Secondary | ICD-10-CM

## 2012-06-02 DIAGNOSIS — N189 Chronic kidney disease, unspecified: Secondary | ICD-10-CM

## 2012-06-02 HISTORY — DX: Disorder of kidney and ureter, unspecified: N28.9

## 2012-06-02 HISTORY — DX: Pulmonary hypertension, unspecified: I27.20

## 2012-06-02 LAB — URINALYSIS, ROUTINE W REFLEX MICROSCOPIC
Bilirubin Urine: NEGATIVE
Glucose, UA: NEGATIVE mg/dL
Hgb urine dipstick: NEGATIVE
Ketones, ur: NEGATIVE mg/dL
Leukocytes, UA: NEGATIVE
Nitrite: NEGATIVE
Protein, ur: NEGATIVE mg/dL
Specific Gravity, Urine: 1.02 (ref 1.005–1.030)
Urobilinogen, UA: 0.2 mg/dL (ref 0.0–1.0)
pH: 5.5 (ref 5.0–8.0)

## 2012-06-02 LAB — COMPREHENSIVE METABOLIC PANEL
ALT: 26 U/L (ref 0–35)
AST: 19 U/L (ref 0–37)
Albumin: 4 g/dL (ref 3.5–5.2)
Alkaline Phosphatase: 47 U/L (ref 39–117)
BUN: 42 mg/dL — ABNORMAL HIGH (ref 6–23)
CO2: 30 mEq/L (ref 19–32)
Calcium: 9.9 mg/dL (ref 8.4–10.5)
Chloride: 97 mEq/L (ref 96–112)
Creatinine, Ser: 1.83 mg/dL — ABNORMAL HIGH (ref 0.50–1.10)
GFR calc Af Amer: 35 mL/min — ABNORMAL LOW (ref >90)
GFR calc non Af Amer: 30 mL/min — ABNORMAL LOW (ref >90)
Glucose, Bld: 283 mg/dL — ABNORMAL HIGH (ref 70–99)
Potassium: 3.1 mEq/L — ABNORMAL LOW (ref 3.5–5.1)
Sodium: 140 mEq/L (ref 135–145)
Total Bilirubin: 0.3 mg/dL (ref 0.3–1.2)
Total Protein: 8.3 g/dL (ref 6.0–8.3)

## 2012-06-02 LAB — GLUCOSE, CAPILLARY: Glucose-Capillary: 186 mg/dL — ABNORMAL HIGH (ref 70–99)

## 2012-06-02 LAB — PRO B NATRIURETIC PEPTIDE: Pro B Natriuretic peptide (BNP): 15.1 pg/mL (ref 0–125)

## 2012-06-02 LAB — DIFFERENTIAL
Basophils Absolute: 0 10*3/uL (ref 0.0–0.1)
Basophils Relative: 0 % (ref 0–1)
Eosinophils Absolute: 0.4 10*3/uL (ref 0.0–0.7)
Eosinophils Relative: 4 % (ref 0–5)
Lymphocytes Relative: 27 % (ref 12–46)
Lymphs Abs: 3.1 10*3/uL (ref 0.7–4.0)
Monocytes Absolute: 0.6 10*3/uL (ref 0.1–1.0)
Monocytes Relative: 5 % (ref 3–12)
Neutro Abs: 7.2 10*3/uL (ref 1.7–7.7)
Neutrophils Relative %: 64 % (ref 43–77)

## 2012-06-02 LAB — CBC
HCT: 39.7 % (ref 36.0–46.0)
Hemoglobin: 13.5 g/dL (ref 12.0–15.0)
MCH: 31.5 pg (ref 26.0–34.0)
MCHC: 34 g/dL (ref 30.0–36.0)
MCV: 92.5 fL (ref 78.0–100.0)
Platelets: 290 10*3/uL (ref 150–400)
RBC: 4.29 MIL/uL (ref 3.87–5.11)
RDW: 12.8 % (ref 11.5–15.5)
WBC: 11.3 10*3/uL — ABNORMAL HIGH (ref 4.0–10.5)

## 2012-06-02 LAB — CARDIAC PANEL(CRET KIN+CKTOT+MB+TROPI)
CK, MB: 1.6 ng/mL (ref 0.3–4.0)
CK, MB: 1.6 ng/mL (ref 0.3–4.0)
Relative Index: 1.2 (ref 0.0–2.5)
Relative Index: 1.3 (ref 0.0–2.5)
Total CK: 130 U/L (ref 7–177)
Total CK: 125 U/L (ref 7–177)
Troponin I: <0.3 ng/mL (ref <0.30)
Troponin I: <0.3 ng/mL (ref <0.30)

## 2012-06-02 LAB — PROTIME-INR
INR: 1.1 (ref 0.00–1.49)
Prothrombin Time: 14.4 seconds (ref 11.6–15.2)

## 2012-06-02 LAB — D-DIMER, QUANTITATIVE (NOT AT ARMC): D-Dimer, Quant: 0.23 ug/mL-FEU (ref 0.00–0.48)

## 2012-06-02 MED ORDER — MORPHINE SULFATE 2 MG/ML IJ SOLN
2.0000 mg | Freq: Once | INTRAMUSCULAR | Status: AC
  Administered 2012-06-02: 2 mg via INTRAVENOUS

## 2012-06-02 MED ORDER — HEPARIN (PORCINE) IN NACL 100-0.45 UNIT/ML-% IJ SOLN
1200.0000 [IU]/h | INTRAMUSCULAR | Status: DC
  Administered 2012-06-02: 1200 [IU]/h via INTRAVENOUS
  Filled 2012-06-02 (×3): qty 250

## 2012-06-02 MED ORDER — INSULIN ASPART 100 UNIT/ML ~~LOC~~ SOLN: 0.0000 [IU] | Freq: Three times a day (TID) | SUBCUTANEOUS | Status: DC

## 2012-06-02 MED ORDER — NITROGLYCERIN 2 % TD OINT
0.5000 [in_us] | TOPICAL_OINTMENT | Freq: Four times a day (QID) | TRANSDERMAL | Status: DC
  Administered 2012-06-03: 0.5 [in_us] via TOPICAL
  Filled 2012-06-02 (×2): qty 30

## 2012-06-02 MED ORDER — INSULIN ASPART 100 UNIT/ML ~~LOC~~ SOLN: 0.0000 [IU] | Freq: Every day | SUBCUTANEOUS | Status: DC

## 2012-06-02 MED ORDER — HYDROCODONE-ACETAMINOPHEN 5-325 MG PO TABS
0.5000 | ORAL_TABLET | Freq: Every day | ORAL | Status: DC | PRN
  Administered 2012-06-02: 1 via ORAL
  Filled 2012-06-02: qty 1

## 2012-06-02 MED ORDER — ALPRAZOLAM 0.25 MG PO TABS: 0.1250 mg | ORAL_TABLET | Freq: Every day | ORAL | Status: DC | PRN

## 2012-06-02 MED ORDER — IPRATROPIUM BROMIDE 0.02 % IN SOLN
0.5000 mg | Freq: Once | RESPIRATORY_TRACT | Status: AC
  Administered 2012-06-02: 0.5 mg via RESPIRATORY_TRACT
  Filled 2012-06-02: qty 2.5

## 2012-06-02 MED ORDER — ACETAMINOPHEN 325 MG PO TABS: 650.0000 mg | ORAL_TABLET | ORAL | Status: DC | PRN

## 2012-06-02 MED ORDER — FENOFIBRATE 160 MG PO TABS
160.0000 mg | ORAL_TABLET | Freq: Every morning | ORAL | Status: DC
  Filled 2012-06-02: qty 1

## 2012-06-02 MED ORDER — HEPARIN BOLUS VIA INFUSION
4000.0000 [IU] | Freq: Once | INTRAVENOUS | Status: AC
  Administered 2012-06-02: 4000 [IU] via INTRAVENOUS

## 2012-06-02 MED ORDER — MORPHINE SULFATE 2 MG/ML IJ SOLN
2.0000 mg | INTRAMUSCULAR | Status: DC | PRN
  Administered 2012-06-02 – 2012-06-03 (×2): 2 mg via INTRAVENOUS
  Filled 2012-06-02 (×2): qty 1

## 2012-06-02 MED ORDER — FLUTICASONE PROPIONATE 50 MCG/ACT NA SUSP
1.0000 | Freq: Every day | NASAL | Status: DC
  Filled 2012-06-02: qty 16

## 2012-06-02 MED ORDER — LOSARTAN POTASSIUM 50 MG PO TABS
100.0000 mg | ORAL_TABLET | Freq: Every morning | ORAL | Status: DC
  Filled 2012-06-02: qty 2

## 2012-06-02 MED ORDER — ALBUTEROL SULFATE HFA 108 (90 BASE) MCG/ACT IN AERS: 2.0000 | INHALATION_SPRAY | Freq: Four times a day (QID) | RESPIRATORY_TRACT | Status: DC | PRN

## 2012-06-02 MED ORDER — SODIUM CHLORIDE 0.9 % IV SOLN
Freq: Once | INTRAVENOUS | Status: AC
  Administered 2012-06-02: 17:00:00 via INTRAVENOUS

## 2012-06-02 MED ORDER — POTASSIUM CHLORIDE CRYS ER 20 MEQ PO TBCR
40.0000 meq | EXTENDED_RELEASE_TABLET | Freq: Once | ORAL | Status: AC
  Administered 2012-06-02: 40 meq via ORAL
  Filled 2012-06-02: qty 1

## 2012-06-02 MED ORDER — POTASSIUM CHLORIDE CRYS ER 20 MEQ PO TBCR
20.0000 meq | EXTENDED_RELEASE_TABLET | Freq: Every day | ORAL | Status: DC
  Filled 2012-06-02: qty 1
  Filled 2012-06-02: qty 2
  Filled 2012-06-02: qty 1

## 2012-06-02 MED ORDER — SODIUM CHLORIDE 0.9 % IV SOLN
INTRAVENOUS | Status: DC
  Administered 2012-06-02 – 2012-06-03 (×2): 1000 mL via INTRAVENOUS

## 2012-06-02 MED ORDER — FLUTICASONE PROPIONATE HFA 44 MCG/ACT IN AERO
2.0000 | INHALATION_SPRAY | Freq: Two times a day (BID) | RESPIRATORY_TRACT | Status: DC
  Administered 2012-06-02 – 2012-06-03 (×2): 2 via RESPIRATORY_TRACT
  Filled 2012-06-02: qty 10.6

## 2012-06-02 MED ORDER — LORATADINE 10 MG PO TABS
10.0000 mg | ORAL_TABLET | Freq: Every day | ORAL | Status: DC
  Filled 2012-06-02: qty 1

## 2012-06-02 MED ORDER — NITROGLYCERIN 0.4 MG SL SUBL
0.4000 mg | SUBLINGUAL_TABLET | SUBLINGUAL | Status: DC | PRN
  Administered 2012-06-02: 0.4 mg via SUBLINGUAL
  Filled 2012-06-02: qty 25

## 2012-06-02 MED ORDER — MORPHINE SULFATE 2 MG/ML IJ SOLN
INTRAMUSCULAR | Status: AC
  Filled 2012-06-02: qty 1

## 2012-06-02 MED ORDER — ALBUTEROL SULFATE (5 MG/ML) 0.5% IN NEBU
2.5000 mg | INHALATION_SOLUTION | Freq: Once | RESPIRATORY_TRACT | Status: AC
  Administered 2012-06-02: 2.5 mg via RESPIRATORY_TRACT
  Filled 2012-06-02: qty 0.5

## 2012-06-02 NOTE — ED Notes (Signed)
Report given to carelink. Pt reports pain back when talks. New order received from Dr. Wyvonnia Dusky

## 2012-06-02 NOTE — ED Notes (Signed)
Ambulating pulse ox of RA 100%

## 2012-06-02 NOTE — ED Provider Notes (Signed)
History   This chart was scribed for Ezequiel Essex, MD by Carolyne Littles. The patient was seen in room APA02/APA02. Patient's care was started at 1446.    CSN: [id number]  Arrival date & time 06/02/12  5   First MD Initiated Contact with Patient 06/02/12 1459      Chief Complaint  Patient presents with  . Chest Pain    (Consider location/radiation/quality/duration/timing/severity/associated sxs/prior treatment) HPI Sara Tran is a 56 y.o. female who presents to the Emergency Department complaining of constant moderate left sided chest pain radiating to LUE onset several days ago and gradually worseningsince with associated SOB, congestion and fatigue. States SOB is worse with exertion. Denies cough, nausea, vomiting, weakness, diaphoresis. Patient notes she generally experiences chest pain once or twice per week.  Patient with h/o COPD, CHF, diabetes, HTN, sleep apnea, IBS.  PCP- [facility] Cardiologist- [place]  Past Medical History  Diagnosis Date  . CHF (congestive heart failure)     Normal left ventricular systolic function, ejection fraction 55-60% 07/2007 by cath.  . Diabetes mellitus   . Sleep apnea     CPAP use  . Pulmonary HTN     RHC 2008:  RA pressures 13/10 with a mean of 7 mmHg.  RV pressure 123456 with end diastolic pressure of 15 mmHg.  PA pressure 49/23 with a mean of 35 mmHg.  Pulmonary capillary wedge 17/50 with a mean of 14 mmHg. The PA saturation was 68%.  RA saturation was 71% and aortic saturation was 90%.  Cardiac output was 6.0 with a cardiac index of 2.80 by Fick.  Normal coronaries by cath 2008.  . Morbid obesity     s/p lap band surgery  . Asthma   . Arthritis   . Hypertriglyceridemia   . COPD (chronic obstructive pulmonary disease)   . IBS (irritable bowel syndrome)   . Renal insufficiency     Past Surgical History  Procedure Date  . Laparoscopic cholecystectomy 1985  . Cardiac catheterization 2008    w/o coronary artery disease  .  Laparoscopic gastric banding 12/12/2010  . Pilonidal cyst excision 1974  . Tumor excision 10/2011    left thigh  . Tumor excision 10/2011    on face    Family History  Problem Relation Age of Onset  . Breast cancer Sister   . Breast cancer Paternal Aunt   . Diabetes Mother   . Heart failure Mother     History  Substance Use Topics  . Smoking status: Former Smoker    Quit date: 08/08/1996  . Smokeless tobacco: Never Used  . Alcohol Use: No    OB History    Grav Para Term Preterm Abortions TAB SAB Ect Mult Living                  Review of Systems A complete 10 system review of systems was obtained and all systems are negative except as noted in the HPI and PMH.   Allergies  Aspirin; Ciprofloxacin; and Latex  Home Medications   No current outpatient prescriptions on file.  BP 120/68  Pulse 93  Temp(Src) 97.5 F (36.4 C) (Oral)  Resp 20  Ht 5' (1.524 m)  Wt 282 lb (127.914 kg)  BMI 55.07 kg/m2  SpO2 97%  Physical Exam  Nursing note and vitals reviewed. Constitutional: She is oriented to person, place, and time. She appears well-developed and well-nourished. No distress.  HENT:  Head: Normocephalic and atraumatic.  Eyes: EOM are normal. Pupils  are equal, round, and reactive to light.  Neck: Neck supple. No tracheal deviation present.  Cardiovascular: Normal rate and regular rhythm.  Exam reveals no gallop and no friction rub.   No murmur heard. Pulmonary/Chest: Effort normal. No respiratory distress. She has no wheezes. She has no rales.  Abdominal: Soft. She exhibits no distension. There is no tenderness.       Morbidly obese.   Musculoskeletal: Normal range of motion. She exhibits no edema.  Neurological: She is alert and oriented to person, place, and time. No sensory deficit.  Skin: Skin is warm and dry.  Psychiatric: She has a normal mood and affect. Her behavior is normal.    ED Course  Procedures (including critical care time)  DIAGNOSTIC  STUDIES: Oxygen Saturation is 98% on room air, normal by my interpretation.    COORDINATION OF CARE:    Labs Reviewed  CBC - Abnormal; Notable for the following:    WBC 11.3 (*)    All other components within normal limits  COMPREHENSIVE METABOLIC PANEL - Abnormal; Notable for the following:    Potassium 3.1 (*)    Glucose, Bld 283 (*)    BUN 42 (*)    Creatinine, Ser 1.83 (*)    GFR calc non Af Amer 30 (*)    GFR calc Af Amer 35 (*)    All other components within normal limits  LIPID PANEL - Abnormal; Notable for the following:    Triglycerides 262 (*)    VLDL 52 (*)    All other components within normal limits  GLUCOSE, CAPILLARY - Abnormal; Notable for the following:    Glucose-Capillary 186 (*)    All other components within normal limits  BASIC METABOLIC PANEL - Abnormal; Notable for the following:    Potassium 3.4 (*)    Glucose, Bld 202 (*)    BUN 41 (*)    Creatinine, Ser 1.54 (*)    GFR calc non Af Amer 37 (*)    GFR calc Af Amer 43 (*)    All other components within normal limits  GLUCOSE, CAPILLARY - Abnormal; Notable for the following:    Glucose-Capillary 132 (*)    All other components within normal limits  GLUCOSE, CAPILLARY - Abnormal; Notable for the following:    Glucose-Capillary 120 (*)    All other components within normal limits  GLUCOSE, CAPILLARY - Abnormal; Notable for the following:    Glucose-Capillary 127 (*)    All other components within normal limits  POCT I-STAT 3, BLOOD GAS (G3+) - Abnormal; Notable for the following:    pH, Arterial 7.406 (*)    pO2, Arterial 156.0 (*)    Bicarbonate 26.3 (*)    All other components within normal limits  POCT I-STAT 3, BLOOD GAS (G3P V) - Abnormal; Notable for the following:    pH, Ven 7.329 (*)    Bicarbonate 25.1 (*)    All other components within normal limits  POCT I-STAT 3, BLOOD GAS (G3P V) - Abnormal; Notable for the following:    pH, Ven 7.354 (*)    pO2, Ven 48.0 (*)    Bicarbonate 27.4  (*)    All other components within normal limits  DIFFERENTIAL  D-DIMER, QUANTITATIVE  CARDIAC PANEL(CRET KIN+CKTOT+MB+TROPI)  PRO B NATRIURETIC PEPTIDE  URINALYSIS, ROUTINE W REFLEX MICROSCOPIC  CARDIAC PANEL(CRET KIN+CKTOT+MB+TROPI)  CARDIAC PANEL(CRET KIN+CKTOT+MB+TROPI)  PROTIME-INR  TSH  POCT ACTIVATED CLOTTING TIME  CARDIAC PANEL(CRET KIN+CKTOT+MB+TROPI)   Dg Chest 2 View  06/02/2012  *  RADIOLOGY REPORT*  Clinical Data: chest pain, shortness of breath.  CHEST - 2 VIEW  Comparison: 05/18/2010  Findings: Heart and mediastinal contours are within normal limits. No focal opacities or effusions.  No acute bony abnormality.  IMPRESSION: No active cardiopulmonary disease.  Original Report Authenticated By: Raelyn Number, M.D.     1. Unstable angina   2. Dyspnea on exertion   3. Acute on chronic renal insufficiency   4. Chronic diastolic heart failure   5. Diabetes mellitus   6. Morbid obesity   7. Unspecified sleep apnea       MDM  Shortness of breath, dyspnea on exertion, PND, chest pain.symptoms have been chronic but worse over the past 2 days. She seen cardiology and been referred for catheterization but has not had it done. This is felt to be multifactorial from CHF, pulmonary hypertension  Case discussed with Dr. Haroldine Laws.  Patient has acute on chronic dyspnea, chest tightness concerning for angina.  Has been recommended for cath in the past.  Given patient's worsening DOE, chest pain on exertion, would recommend admission for catheterization. Patient agreeable. She has aspirin allergy. (states rectal bleeding).  Will monitor heparin use.   Date: 06/02/2012  Rate: 101  Rhythm: sinus tachycardia  QRS Axis: left  Intervals: normal  ST/T Wave abnormalities: nonspecific ST/T changes  Conduction Disutrbances:none  Narrative Interpretation:   Old EKG Reviewed: unchanged      I personally performed the services described in this documentation, which was scribed in my  presence.  The recorded information has been reviewed and considered.    Ezequiel Essex, MD 06/03/12 1210

## 2012-06-02 NOTE — ED Notes (Addendum)
Chest pain , sob since last week,  White sputum Nausea,

## 2012-06-02 NOTE — Progress Notes (Signed)
ANTICOAGULATION CONSULT NOTE - Initial Consult  Pharmacy Consult for Heparin Indication: chest pain/ACS  Allergies  Allergen Reactions  . Aspirin     Rectal bleeding  . Ciprofloxacin     Coughed up blood  . Latex Itching and Rash    Patient Measurements: Height: 5\' 2"  (157.5 cm) Weight: 280 lb (127.007 kg) IBW/kg (Calculated) : 50.1  Heparin Dosing Weight: 90 kg  Vital Signs: Temp: 98.1 F (36.7 C) (06/03 1448) Temp src: Oral (06/03 1448) BP: 125/62 mmHg (06/03 1700) Pulse Rate: 93  (06/03 1700)  Labs:  Basename 06/02/12 1507  HGB 13.5  HCT 39.7  PLT 290  APTT --  LABPROT --  INR --  HEPARINUNFRC --  CREATININE 1.83*  CKTOTAL 130  CKMB 1.6  TROPONINI <0.30    Estimated Creatinine Clearance: 44.9 ml/min (by C-G formula based on Cr of 1.83).   Medical History: Past Medical History  Diagnosis Date  . CHF (congestive heart failure)     diastolic  . Diabetes mellitus   . Sleep apnea     CPAP use  . Hypertension     pulmonary  . Morbid obesity   . Asthma   . Arthritis   . Hypertriglyceridemia   . COPD (chronic obstructive pulmonary disease)   . IBS (irritable bowel syndrome)     Medications:  Scheduled:    . sodium chloride   Intravenous Once  . albuterol  2.5 mg Nebulization Once  . ipratropium  0.5 mg Nebulization Once    Assessment: 56 yo F presents with chest pain. No bleeding noted. Initial troponins negative.   Goal of Therapy:  Heparin level 0.3-0.7 units/ml Monitor platelets by anticoagulation protocol: Yes   Plan:  Give 4000 units bolus x 1 Start heparin infusion at 1200 units/hr Check anti-Xa level in 6 hours and daily while on heparin Continue to monitor H&H and platelets  Biagio Borg 06/02/2012,5:09 PM

## 2012-06-02 NOTE — H&P (Signed)
History and Physical  Patient ID: ELIZIA PAOLO MRN: [id number], DOB: Oct 31, 1956 Date of Encounter: 06/02/2012, 6:46 PM Primary Physician: Purvis Kilts, MD, MD Primary Nephrologist: Dr. Hinda Lenis Primary Cardiologist: Dr. Haroldine Laws  Chief Complaint: CP  HPI: Ms. Kilpela is a 56 y.o. African American female with history of pulmonary hypertension, diabetes, HF, COPD, sleep apnea with CPAP, elevated triglycerides for 15 years, and renal insufficiency, obesity s/p lap band in 2011. She was referred to the CHF clnic in April 2013 for of her dyspnea and pulmonary hypertension. She was complaining of dyspnea as well as some left-sided chest pain radiating down her arm resolving with NTG. Left and right heart cath were recommended at that time which the patient was hesitate to arrange; she planned to call back to schedule after a barium swallow and colonoscopy but it does not appear this occurred. She returned to the [facility] with complaints of CP. She has had this on/off for the last few weeks, worse with exertion like a "elephant sitting on her chest." It is relieved by rest. She has also tried inhalers/NTG with some relief initially but they helped less today so she came to the ER. She was seen by her PCP last week for CP/SOB and was started on Torsemide initially with some relief of SOB but CP persisted. She states Cr was 1.07 last week when she was started on this.  BUN/Cr are 42/1.83 today (last labs we have in Epic are from 2011, 19/1.64). She denies any orthopnea, palpitations or syncope. No more LEE than usual. Weight went from 284->276 while on Torsemide. She denies any current CP but feels very fatigued.  CE's neg x 1, pBNP is 15 (normal), and d-dimer is WNL. WBC 11.3, K 3.1. Glucose is also up at 283. CXR demonstrates no active cardiopulmonary disease. She received an atrovent/albuterol neb, 2mg  IV morphine, 1 SL NTG, and was initiated on heparin per pharmacy at [facility] She has a  hx of rectal bleeding "every single time" she's taken ASA whether it be EC or baby ASA in the past, so did not receive that prior to arrival at [facility].  Past Medical History  Diagnosis Date  . CHF (congestive heart failure)     Normal left ventricular systolic function, ejection fraction 55-60% 07/2007 by cath.  . Diabetes mellitus   . Sleep apnea     CPAP use  . Pulmonary HTN     RHC 2008:  RA pressures 13/10 with a mean of 7 mmHg.  RV pressure 123456 with end diastolic pressure of 15 mmHg.  PA pressure 49/23 with a mean of 35 mmHg.  Pulmonary capillary wedge 17/50 with a mean of 14 mmHg. The PA saturation was 68%.  RA saturation was 71% and aortic saturation was 90%.  Cardiac output was 6.0 with a cardiac index of 2.80 by Fick.  Normal coronaries by cath 2008.  . Morbid obesity     s/p lap band surgery  . Asthma   . Arthritis   . Hypertriglyceridemia   . COPD (chronic obstructive pulmonary disease)   . IBS (irritable bowel syndrome)   . Renal insufficiency      Most Recent Cardiac Studies: Per Leone Haven PA-C/Dr. Bensimhon's comprehensive 03/2012 note, PRIOR Workup includes: - VQ scan 2008: normal  - RHC 2008:  RA pressures 13/10 with a mean of 7 mmHg.  RV pressure 123456 with end diastolic pressure of 15 mmHg.  PA pressure 49/23 with a mean  of 35 mmHg.  Pulmonary capillary wedge 17/50 with a mean of 14 mmHg. The PA saturation was 68%.  RA saturation was 71% and aortic saturation was 90%.  Cardiac output was 6.0 with a cardiac index of 2.80 by Fick.  - Normal Coronaries per [facility] 2008. Normal left ventricular systolic function, ejection fraction 55-60% 07/2007.  - Dr. [name] performed PFTs on 03/13/12, report reads Spirometry shows no ventilatory defect and no definite airflow obstruction.  Lung volumes are normal. DLCO is minimally reduced, but is corrected to some extent when considerations of volume are made.   Airway resistance is normal.   FVC 1.91 (81%), FEV1 (88%), FEV1/FVC  87 (107%), FEF 25-75% 2.09 (103%), DLCO 75%    Surgical History:  Past Surgical History  Procedure Date  . Laparoscopic cholecystectomy 1985  . Cardiac catheterization 2008    w/o coronary artery disease  . Laparoscopic gastric banding 12/12/2010  . Pilonidal cyst excision 1974  . Tumor excision 10/2011    left thigh  . Tumor excision 10/2011    on face     Home Meds: Prior to Admission medications   Medication Sig Start Date End Date Taking? Authorizing Provider  albuterol (PROVENTIL HFA;VENTOLIN HFA) 108 (90 BASE) MCG/ACT inhaler Inhale 2 puffs into the lungs every 6 (six) hours as needed. FOR SHORTNESS OF BREATH/WHEEZING   Yes Historical Provider, MD  ALPRAZolam (XANAX) 0.25 MG tablet Take 0.125 mg by mouth daily as needed.   Yes Historical Provider, MD  beclomethasone (QVAR) 80 MCG/ACT inhaler Inhale 2 puffs into the lungs 2 (two) times daily as needed. FOR SHORTNESS OF BREATH/WHEEZING   Yes Historical Provider, MD  fenofibrate 160 MG tablet Take 160 mg by mouth every morning.    Yes Historical Provider, MD  fluticasone (FLONASE) 50 MCG/ACT nasal spray Place 1-2 sprays into the nose daily.    Yes Historical Provider, MD  hydrochlorothiazide 25 MG tablet Take 25 mg by mouth every morning.    Yes Historical Provider, MD  HYDROcodone-acetaminophen (VICODIN) 5-500 MG per tablet Take 0.5-1 tablets by mouth daily as needed. For pain 09/12/11  Yes Historical Provider, MD  isosorbide mononitrate (IMDUR) 30 MG 24 hr tablet Take 30 mg by mouth every morning.  09/05/11  Yes Historical Provider, MD  loratadine (CLARITIN) 10 MG tablet Take 10 mg by mouth daily.    Yes Historical Provider, MD  losartan (COZAAR) 100 MG tablet Take 100 mg by mouth every morning.    Yes Historical Provider, MD  metFORMIN (GLUCOPHAGE) 500 MG tablet Take 500 mg by mouth 2 (two) times daily.   Yes Historical Provider, MD  Multiple Vitamin (MULITIVITAMIN WITH MINERALS) TABS Take 1 tablet by mouth daily.   Yes Historical  Provider, MD  nitroGLYCERIN (NITROSTAT) 0.4 MG SL tablet Place 0.4 mg under the tongue every 5 (five) minutes as needed.   Yes Historical Provider, MD  potassium chloride SA (K-DUR,KLOR-CON) 20 MEQ tablet Take 20 mEq by mouth daily.    Yes Historical Provider, MD  torsemide (DEMADEX) 20 MG tablet Take 20 mg by mouth every morning.   Yes Historical Provider, MD    Allergies:  Allergies  Allergen Reactions  . Aspirin     Rectal bleeding  . Ciprofloxacin     Coughed up blood  . Latex Itching and Rash    History   Social History  . Marital Status: Single    Spouse Name: N/A    Number of Children: 0  . Years of Education: N/A  Occupational History  . Volunteers   . 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 Smoking status: Former Smoker    Quit date: 08/08/1996  . Smokeless tobacco: Never Used  . Alcohol Use: No  . Drug Use: No  . Sexually Active: Not on file   Other Topics Concern  . Not on file   Social History Narrative  . No narrative on file     Family History  Problem Relation Age of Onset  . Breast cancer Sister   . Breast cancer Paternal Aunt   . Diabetes Mother   . Heart failure Mother     Review of Systems: General: negative for chills, fever, night sweats or weight changes.  Cardiovascular: see above Dermatological: negative for rash Respiratory: negative for cough or wheezing Urologic: negative for hematuria Abdominal: negative for nausea, vomiting, diarrhea, bright red blood per rectum, melena, or hematemesis Neurologic: negative for visual changes, syncope, or dizziness. Occasional headaches All other systems reviewed and are otherwise negative except as noted above.  Labs:   Lab Results  Component Value Date   WBC 11.3* 06/02/2012   HGB 13.5 06/02/2012   HCT 39.7 06/02/2012   MCV 92.5 06/02/2012   PLT 290 06/02/2012    Lab 06/02/12 1507  NA 140  K 3.1*  CL 97  CO2 30  BUN 42*  CREATININE 1.83*    CALCIUM 9.9  PROT 8.3  BILITOT 0.3  ALKPHOS 47  ALT 26  AST 19  GLUCOSE 283*    Basename 06/02/12 1507  CKTOTAL 130  CKMB 1.6  TROPONINI <0.30    Lab Results  Component Value Date   DDIMER 0.23 06/02/2012    Radiology/Studies:  1. Chest 2 View 06/02/2012  *RADIOLOGY REPORT*  Clinical Data: chest pain, shortness of breath.  CHEST - 2 VIEW  Comparison: 05/18/2010  Findings: Heart and mediastinal contours are within normal limits. No focal opacities or effusions.  No acute bony abnormality.  IMPRESSION: No active cardiopulmonary disease.  Original Report Authenticated By: Raelyn Number, M.D.    EKG: EKG: sinus tach 101bpm. ST downsloping/biphasic T's I, avL. Subtle ST depression V5-V6 (<0.64mm). No prior to compare to.  Physical Exam: Blood pressure 141/89, pulse 66, temperature 98.3 F (36.8 C), temperature source Oral, resp. rate 19, height 5' (1.524 m), weight 276 lb 1.6 oz (125.238 kg), SpO2 100.00%. General: Well developed obese AAF in no acute distress laying flat in bed. Head: Normocephalic, atraumatic, sclera non-icteric, no xanthomas, nares are without discharge.  Neck: Negative for carotid bruits. JVD not elevated. Lungs: Clear bilaterally to auscultation without wheezes, rales, or rhonchi. Breathing is unlabored. Heart: RRR with S1 S2. No murmurs, rubs, or gallops appreciated. Abdomen: Soft, non-tender, non-distended with normoactive bowel sounds. No hepatomegaly. No rebound/guarding. No obvious abdominal masses. Msk:  Strength and tone appear normal for age. Extremities: No clubbing or cyanosis. Tr edema.  Distal pedal pulses are 2+ and equal bilaterally. Neuro: Alert and oriented X 3. Moves all extremities spontaneously. Psych:  Responds to questions appropriately with a normal affect.    ASSESSMENT AND PLAN:   1. CP/SOB worrisome for [place] 2. H/o chronic diastolic CHF, without exacerbation 3. Acute on chronic renal insufficiency 4. Pulmonary HTN 5. H/o ASPIRIN  INTOLERANCE (reports rectal bleeding in past each time she's had a trial of ASA)  The patient presented with similar complaints in 03/2012 and at that time was recommended R&L heart  cath which she had not yet called back to schedule. We feel her symptoms are concerning for the same at this time. Tentatively she is on the board for cath tomorrow, but that will depend on her renal function in the morning which is likely elevated due to volume depletion from her Torsemide. Will also hold Metformin due to renal insufficiency & in prep for cath; add SSI. Will place on NTG paste and PRN Morphine. Will continue heparin for now but watch closely given hx of rectal bleeding with ASA. The patient reports normal colonoscopy in 2011 but does have a hx of hemorrhoids. She does not recall a trial of any other blood thinning agents in the past. See below for comprehensive thoughts.  Signed, Melina Copa PA-C 06/02/2012, 6:46 PM   Attending note:  Patient seen and examined. Reviewed records of prior workup, consultation note with Dr. Haroldine Laws from April, [place] as recorded above by Ms. Idolina Primer. We discussed the patient's case and collaborated in the plan. Ms. Pageau is a morbidly obese 56 year old woman with previously documented moderate pulmonary hypertension, type 2 diabetes mellitus, obstructive sleep apnea, hyperlipidemia, and chronic renal insufficiency. She has been in her usual state of health until the last few weeks during which time she has generally felt more fatigued and weak, has had lower extremity edema, also shortness of breath. For a longer period of time she has been experiencing intermittent chest tightness radiating to left arm, in fact this was described back in April when she was seen in the heart failure clinic. Plan at that time was actually to pursue a left and right heart catheterization, although the patient never presented for this. More recently she has been treated by Dr. [name] with torsemide,  initially with some improvement in her shortness of breath, however no change in her chest pain pattern. She states that her recent creatinine was "1.07" however today is up to 1.8. She states that she lost approximately 8 pounds over the last week.  On examination she appears comfortable, still complains of intermittent chest pain however. She is afebrile, heart rate is in the 80s to 90s in sinus rhythm, blood pressure 141/89. Summation neck shows increased girth, difficult to assess JVP. Lungs exhibit diminished breath sounds throughout, no wheezing. Cardiac exam with distant regular heart sounds, no definite gallop. Extremities show trace ankle edema.  Lab work reviewed finding potassium 3.1, BUN 42, creatinine 1.8, normal AST and ALT, troponin I less than 0.30, protein BNP 15, WBC 11.3, hemoglobin 13.5, platelets 290, Chest x-ray reports no acute process. ECG shows sinus rhythm with left axis deviation, LVH, nonspecific ST changes.  Chest pain symptoms concerning for unstable angina. Plan was for her to have a left and right heart catheterization as of her assessment by Dr. Haroldine Laws back in April, however she did not present for this. She is now on heparin, plan to hold off on diuretics with gentle hydration, hopefully to see some improvement in her creatinine. Further complicating matters is a reported intolerance to even low-dose aspirin related to recurring rectal bleeding. This will clearly be an issue if percutaneous revascularization needs to be considered. We will keep her n.p.o. after midnight and followup on lab work tomorrow. She will need to be assessed prior to proceeding with cardiac catheterization however.  Satira Sark, M.D., F.A.C.C.

## 2012-06-03 ENCOUNTER — Encounter (HOSPITAL_COMMUNITY)
Admission: EM | Disposition: A | Discharge status: Home / Self Care | Payer: Self-pay | Source: Ambulatory Visit | Attending: Internal Medicine

## 2012-06-03 DIAGNOSIS — I509 Heart failure, unspecified: Secondary | ICD-10-CM

## 2012-06-03 DIAGNOSIS — R0602 Shortness of breath: Secondary | ICD-10-CM

## 2012-06-03 HISTORY — PX: LEFT AND RIGHT HEART CATHETERIZATION WITH CORONARY ANGIOGRAM: SHX5449

## 2012-06-03 LAB — GLUCOSE, CAPILLARY
Glucose-Capillary: 132 mg/dL — ABNORMAL HIGH (ref 70–99)
Glucose-Capillary: 120 mg/dL — ABNORMAL HIGH (ref 70–99)
Glucose-Capillary: 127 mg/dL — ABNORMAL HIGH (ref 70–99)
Glucose-Capillary: 184 mg/dL — ABNORMAL HIGH (ref 70–99)

## 2012-06-03 LAB — POCT I-STAT 3, VENOUS BLOOD GAS (G3P V)
Acid-Base Excess: 1 mmol/L (ref 0.0–2.0)
Acid-base deficit: 1 mmol/L (ref 0.0–2.0)
Acid-base deficit: 1 mmol/L (ref 0.0–2.0)
Bicarbonate: 25.1 mEq/L — ABNORMAL HIGH (ref 20.0–24.0)
Bicarbonate: 27.4 mEq/L — ABNORMAL HIGH (ref 20.0–24.0)
Bicarbonate: 26.1 mEq/L — ABNORMAL HIGH (ref 20.0–24.0)
O2 Saturation: 76 %
O2 Saturation: 81 %
O2 Saturation: 73 %
TCO2: 27 mmol/L (ref 0–100)
TCO2: 29 mmol/L (ref 0–100)
TCO2: 28 mmol/L (ref 0–100)
pCO2, Ven: 47.8 mmHg (ref 45.0–50.0)
pCO2, Ven: 49.2 mmHg (ref 45.0–50.0)
pCO2, Ven: 51.4 mmHg — ABNORMAL HIGH (ref 45.0–50.0)
pH, Ven: 7.329 — ABNORMAL HIGH (ref 7.250–7.300)
pH, Ven: 7.354 — ABNORMAL HIGH (ref 7.250–7.300)
pH, Ven: 7.313 — ABNORMAL HIGH (ref 7.250–7.300)
pO2, Ven: 44 mmHg (ref 30.0–45.0)
pO2, Ven: 48 mmHg — ABNORMAL HIGH (ref 30.0–45.0)
pO2, Ven: 42 mmHg (ref 30.0–45.0)

## 2012-06-03 LAB — POCT ACTIVATED CLOTTING TIME: Activated Clotting Time: 122 seconds

## 2012-06-03 LAB — CARDIAC PANEL(CRET KIN+CKTOT+MB+TROPI)
CK, MB: 1.5 ng/mL (ref 0.3–4.0)
CK, MB: 1.8 ng/mL (ref 0.3–4.0)
Relative Index: 1.3 (ref 0.0–2.5)
Relative Index: 1.4 (ref 0.0–2.5)
Total CK: 115 U/L (ref 7–177)
Total CK: 131 U/L (ref 7–177)
Troponin I: <0.3 ng/mL (ref <0.30)
Troponin I: <0.3 ng/mL (ref <0.30)

## 2012-06-03 LAB — POCT I-STAT 3, ART BLOOD GAS (G3+)
Acid-Base Excess: 1 mmol/L (ref 0.0–2.0)
Bicarbonate: 26.3 mEq/L — ABNORMAL HIGH (ref 20.0–24.0)
O2 Saturation: 99 %
TCO2: 28 mmol/L (ref 0–100)
pCO2 arterial: 41.9 mmHg (ref 35.0–45.0)
pH, Arterial: 7.406 — ABNORMAL HIGH (ref 7.350–7.400)
pO2, Arterial: 156 mmHg — ABNORMAL HIGH (ref 80.0–100.0)

## 2012-06-03 LAB — LIPID PANEL
Cholesterol: 132 mg/dL (ref 0–200)
HDL: 49 mg/dL (ref >39)
LDL Cholesterol: 31 mg/dL (ref 0–99)
Total CHOL/HDL Ratio: 2.7 RATIO
Triglycerides: 262 mg/dL — ABNORMAL HIGH (ref <150)
VLDL: 52 mg/dL — ABNORMAL HIGH (ref 0–40)

## 2012-06-03 LAB — BASIC METABOLIC PANEL
BUN: 41 mg/dL — ABNORMAL HIGH (ref 6–23)
CO2: 29 mEq/L (ref 19–32)
Calcium: 9.2 mg/dL (ref 8.4–10.5)
Chloride: 98 mEq/L (ref 96–112)
Creatinine, Ser: 1.54 mg/dL — ABNORMAL HIGH (ref 0.50–1.10)
GFR calc Af Amer: 43 mL/min — ABNORMAL LOW (ref >90)
GFR calc non Af Amer: 37 mL/min — ABNORMAL LOW (ref >90)
Glucose, Bld: 202 mg/dL — ABNORMAL HIGH (ref 70–99)
Potassium: 3.4 mEq/L — ABNORMAL LOW (ref 3.5–5.1)
Sodium: 139 mEq/L (ref 135–145)

## 2012-06-03 LAB — TSH: TSH: 1.671 u[IU]/mL (ref 0.350–4.500)

## 2012-06-03 SURGERY — LEFT AND RIGHT HEART CATHETERIZATION WITH CORONARY ANGIOGRAM
Anesthesia: LOCAL

## 2012-06-03 MED ORDER — ONDANSETRON HCL 4 MG/2ML IJ SOLN: 4.0000 mg | Freq: Four times a day (QID) | INTRAMUSCULAR | Status: DC | PRN

## 2012-06-03 MED ORDER — SODIUM CHLORIDE 0.9 % IV SOLN: 250.0000 mL | INTRAVENOUS | Status: DC | PRN

## 2012-06-03 MED ORDER — POTASSIUM CHLORIDE CRYS ER 20 MEQ PO TBCR
40.0000 meq | EXTENDED_RELEASE_TABLET | Freq: Once | ORAL | Status: AC
  Administered 2012-06-03: 40 meq via ORAL

## 2012-06-03 MED ORDER — MORPHINE SULFATE 2 MG/ML IJ SOLN: 2.0000 mg | Freq: Once | INTRAMUSCULAR | Status: DC

## 2012-06-03 MED ORDER — METFORMIN HCL 500 MG PO TABS: 500.0000 mg | ORAL_TABLET | Freq: Two times a day (BID) | ORAL | Status: DC

## 2012-06-03 MED ORDER — LIDOCAINE HCL (PF) 1 % IJ SOLN
INTRAMUSCULAR | Status: AC
  Filled 2012-06-03: qty 30

## 2012-06-03 MED ORDER — SODIUM CHLORIDE 0.9 % IJ SOLN: 3.0000 mL | Freq: Two times a day (BID) | INTRAMUSCULAR | Status: DC

## 2012-06-03 MED ORDER — MORPHINE SULFATE 4 MG/ML IJ SOLN
INTRAMUSCULAR | Status: AC
  Filled 2012-06-03: qty 1

## 2012-06-03 MED ORDER — HEPARIN (PORCINE) IN NACL 2-0.9 UNIT/ML-% IJ SOLN
INTRAMUSCULAR | Status: AC
  Filled 2012-06-03: qty 2000

## 2012-06-03 MED ORDER — ACETAMINOPHEN 325 MG PO TABS: 650.0000 mg | ORAL_TABLET | ORAL | Status: DC | PRN

## 2012-06-03 MED ORDER — SODIUM CHLORIDE 0.9 % IJ SOLN: 3.0000 mL | INTRAMUSCULAR | Status: DC | PRN

## 2012-06-03 MED ORDER — NITROGLYCERIN 0.2 MG/ML ON CALL CATH LAB
INTRAVENOUS | Status: AC
  Filled 2012-06-03: qty 1

## 2012-06-03 MED ORDER — FENTANYL CITRATE 0.05 MG/ML IJ SOLN
INTRAMUSCULAR | Status: AC
  Filled 2012-06-03: qty 2

## 2012-06-03 MED ORDER — MIDAZOLAM HCL 2 MG/2ML IJ SOLN
INTRAMUSCULAR | Status: AC
  Filled 2012-06-03: qty 2

## 2012-06-03 MED ORDER — SODIUM CHLORIDE 0.9 % IV SOLN: INTRAVENOUS | Status: AC

## 2012-06-03 NOTE — Care Management Note (Unsigned)
    Page 1 of 1   06/03/2012     11:29:59 AM   CARE MANAGEMENT NOTE 06/03/2012  Patient:  Sara Tran, Sara Tran   Account Number:  [id number]  Date Initiated:  06/03/2012  Documentation initiated by:  SIMMONS,Sheryll Dymek  Subjective/Objective Assessment:   ADMITTED WITH CHEST PAIN; LIVES AT [place]; [place].     Action/Plan:   DISCHARGE PLANNING.   Anticipated DC Date:  06/03/2012   Anticipated DC Plan:  [place]  CM consult      Choice offered to / List presented to:             Status of service:  In process, will continue to follow Medicare Important Message given?   (If response is "NO", the following Medicare IM given date fields will be blank) Date Medicare IM given:   Date Additional Medicare IM given:    Discharge Disposition:    Per UR Regulation:  Reviewed for med. necessity/level of care/duration of stay  If discussed at [place] of Stay Meetings, dates discussed:    Comments:  06/03/12  [place], BSN [phone]

## 2012-06-03 NOTE — CV Procedure (Signed)
Cardiac Cath Procedure Note  Indication: Dyspnea and HF  Procedures performed:  1) Right heart cathererization 2) Selective coronary angiography 3) Left heart catheterization   Description of procedure:     The risks and indication of the procedure were explained. Consent was signed and placed on the chart. An appropriate timeout was taken prior to the procedure. The right groin was prepped and draped in the routine sterile fashion and anesthetized with 1% local lidocaine.   A 5 FR arterial sheath was placed in the right femoral artery using a modified Seldinger technique. Standard catheters including a JL4, JR4 and angled pigtail were used. All catheter exchanges were made over a wire. A 7 FR venous sheath was placed in the right femoral vein using a modified Seldinger technique. A standard Swan-Ganz catheter was used for the procedure. A LV gram was not done in order to limit contrast exposure.  Total contrast used: 30 cc  Complications:  None apparent  Findings:  RA = 9 RV = 42/6/10 PA = 40/17 (24) PCW = 12 Fick cardiac output/index = 6.0/2.8 PVR = 2.0 Woods units FA sat = 99% PA sat = 73%, 76% SVC sat = 81%  Ao Pressure: 151/71 (103) LV Pressure:  150/8/14 There was no signficant gradient across the aortic valve on pullback.  Left main: Normal  LAD: Moderate sized vessel. Normal.   LCX: Large dominant vessel. Normal.  RCA: Small. Non dominant. Normal   Assessment: 1. Normal coronaries 2. Minimally elevated R-sided pressures with normal PVR   Plan/Discussion:  Sara Tran cath is essentially norma. R-sided pressures minimally elevated with normal PVR. Symptoms likely due to obesity and OSA. Will need weight loss. Will check echo to complete work-up. Home alter today if access site stable.   Glori Bickers, MD 9:19 AM

## 2012-06-03 NOTE — Progress Notes (Signed)
  Echocardiogram 2D Echocardiogram has been performed.  Sara Tran 06/03/2012, 4:15 PM

## 2012-06-03 NOTE — Interval H&P Note (Signed)
History and Physical Interval Note:  06/03/2012 8:38 AM  Sara Tran  has presented today for surgery, with the diagnosis of Chest pain  The various methods of treatment have been discussed with the patient and family. After consideration of risks, benefits and other options for treatment, the patient has consented to  Procedure(s) (LRB): LEFT AND RIGHT HEART CATHETERIZATION WITH CORONARY ANGIOGRAM (N/A) as a surgical intervention .  The patients' history has been reviewed, patient examined, no change in status, stable for surgery.  I have reviewed the patients' chart and labs.  Questions were answered to the patient's satisfaction.     Darrian Grzelak

## 2012-06-03 NOTE — Discharge Summary (Signed)
Discharge Summary   Patient ID: DEMITA VOTTO MRN: [id number], DOB/AGE: 55-May-1957 55 y.o.  Primary MD: Purvis Kilts, MD Primary Cardiologist: Dr. Haroldine Laws  Admit date: 06/02/2012 D/C date:     06/03/2012      Primary Discharge Diagnoses:  1. Chest Pain  - No objective evidence of ischemia  - [facility] 06/03/12 revealed normal coronaries and minimally elevated R-sided pressure with normal PVR  - Echo 06/03/12 normal EF  - Symptoms felt likely due to obesity and OSA  - Recs for weight loss   Secondary Discharge Diagnoses:  1. Chronic Diastolic CHF 2. Pulmonary Hypertension 3. Diabetes Mellitus 4. COPD 5. Obesity s/p lap band 2011 6. OSA w/ CPAP 7. Hypertriglyceridemia 8. CKD 9. Asthma 10. Arthritis 11. IBS  Allergies Allergies  Allergen Reactions  . Aspirin     Rectal bleeding  . Ciprofloxacin     Coughed up blood  . Latex Itching and Rash    Diagnostic Studies/Procedures:  .  06/03/2012 - 2D Echocardiogram Study Conclusions  - Left ventricle: The cavity size was normal. Systolic function was normal. The estimated ejection fraction was in the range of 55% to 60%. Wall motion was normal; there were no regional wall motion abnormalities. - Left atrium: The atrium was mildly dilated. - Impressions: Weak TR signal makes estimating PA pressure difficult but no morphologic signs of cor pulmonale _____________  06/03/12 - Right/Left Cardiac Cath Findings:  RA = 9  RV = 42/6/10  PA = 40/17 (24)  PCW = 12  Fick cardiac output/index = 6.0/2.8  PVR = 2.0 Woods units  FA sat = 99%  PA sat = 73%, 76%  SVC sat = 81%  Ao Pressure: 151/71 (103)  LV Pressure: 150/8/14  There was no signficant gradient across the aortic valve on pullback.  Left main: Normal  LAD: Moderate sized vessel. Normal.  LCX: Large dominant vessel. Normal.  RCA: Small. Non dominant. Normal  Assessment:  1. Normal coronaries  2. Minimally elevated R-sided pressures with normal PVR    Plan/Discussion:  Ms. Springett cath is essentially normal. R-sided pressures minimally elevated with normal PVR. Symptoms likely due to obesity and OSA. Will need weight loss. Will check echo to complete work-up. Home alter today if access site stable.   History of Present Illness: 56 y.o. female w/ the above medical problems who presented to [facility] with complaints of chest pain for which she was transferred to [facility] on 06/02/12 for further evaluation and treatment.  She was referred to the CHF clinic in April 2013 for of her dyspnea and pulmonary hypertension. She was complaining of dyspnea as well as some left-sided chest pain radiating down her arm resolving with NTG. Left and right heart cath were recommended at that time which the patient was hesitate to arrange; she planned to call back to schedule after a barium swallow and colonoscopy but it does not appear this occurred. She returned to the [place] with complaints of CP. She has had this on/off for the last few weeks, worse with exertion like a "elephant sitting on her chest." It is relieved by rest. She has also tried inhalers/NTG with some relief initially but they helped less on day of presentation so she came to the ER. She was seen by her PCP last week for CP/SOB and was started on Torsemide initially with some relief of SOB but CP persisted. She stated Cr was 1.07 last week when she was started on  this. She denied any orthopnea, palpitations or syncope. No more LEE than usual. Weight went from 284->276 while on Torsemide.   Hospital Course: In the ED, initial cardiac enzymes were normal, pBNP 15, and d-dimer was WNL. Crt 1.83, WBC 11.3, K 3.1. Glucose is also up at 283. EKG showed sinus rhythm with left axis deviation, LVH, nonspecific ST changes. CXR demonstrated no active cardiopulmonary disease. She received an atrovent/albuterol neb, 2mg  IV morphine, 1 SL NTG, and was initiated on heparin per pharmacy at  [facility]. She has a hx of rectal bleeding "every single time" she's taken ASA whether it be EC or baby ASA in the past, so did not receive that prior to arrival at [facility]. Her symptoms were concerning for unstable angina for which she was transported to [facility] and admitted for further evaluation and treatment.  Cardiac enzymes were cycled and remained negative. Potassium was supplemented as needed. She underwent right and left heart cath on 6/4 revealing normal coronaries and minimally elevated R-sided pressure with normal PVR. Echocardiogram on 6/4 revealed normal LV function. Symptoms were felt likely due to obesity and OSA Recommendations were made for weight loss. Crt returned to baseline.  She was seen and evaluated by Dr. Haroldine Laws who felt she was stable for discharge home with plans for follow up as scheduled below.  Discharge Vitals: Blood pressure 119/47, pulse 93, temperature 97.5 F (36.4 C), temperature source Oral, resp. rate 20, height 5' (1.524 m), weight 282 lb (127.914 kg), SpO2 98.00%.  Labs: Component Value Date   WBC 11.3* 06/02/2012   HGB 13.5 06/02/2012   HCT 39.7 06/02/2012   MCV 92.5 06/02/2012   PLT 290 06/02/2012    Lab 06/03/12 0144 06/02/12 1507  NA 139 --  K 3.4* --  CL 98 --  CO2 29 --  BUN 41* --  CREATININE 1.54* --  CALCIUM 9.2 --  PROT -- 8.3  BILITOT -- 0.3  ALKPHOS -- 47  ALT -- 26  AST -- 19  GLUCOSE 202* --   Basename 06/03/12 0145 06/02/12 2006 06/02/12 1507  CKTOTAL 115 125 130  CKMB 1.5 1.6 1.6  TROPONINI <0.30 <0.30 <0.30   Component Value Date   CHOL 132 06/03/2012   HDL 49 06/03/2012   LDLCALC 31 06/03/2012   TRIG 262* 06/03/2012   Component Value Date   DDIMER 0.23 06/02/2012     06/02/2012 15:07  Pro B Natriuretic peptide (BNP) 15.1     06/02/2012 20:06  TSH 1.671     Discharge Medications   Medication List  As of 06/03/2012  7:05 PM   TAKE these medications         albuterol 108 (90 BASE) MCG/ACT  inhaler   Commonly known as: PROVENTIL HFA;VENTOLIN HFA   Inhale 2 puffs into the lungs every 6 (six) hours as needed. FOR SHORTNESS OF BREATH/WHEEZING      ALPRAZolam 0.25 MG tablet   Commonly known as: XANAX   Take 0.125 mg by mouth daily as needed.      beclomethasone 80 MCG/ACT inhaler   Commonly known as: QVAR   Inhale 2 puffs into the lungs 2 (two) times daily as needed. FOR SHORTNESS OF BREATH/WHEEZING      fenofibrate 160 MG tablet   Take 160 mg by mouth every morning.      fluticasone 50 MCG/ACT nasal spray   Commonly known as: FLONASE   Place 1-2 sprays into the nose daily.  hydrochlorothiazide 25 MG tablet   Commonly known as: HYDRODIURIL   Take 25 mg by mouth every morning.      HYDROcodone-acetaminophen 5-500 MG per tablet   Commonly known as: VICODIN   Take 0.5-1 tablets by mouth daily as needed. For pain      isosorbide mononitrate 30 MG 24 hr tablet   Commonly known as: IMDUR   Take 30 mg by mouth every morning.      loratadine 10 MG tablet   Commonly known as: CLARITIN   Take 10 mg by mouth daily.      losartan 100 MG tablet   Commonly known as: COZAAR   Take 100 mg by mouth every morning.      metFORMIN 500 MG tablet   Commonly known as: GLUCOPHAGE   Take 1 tablet (500 mg total) by mouth 2 (two) times daily. Please hold for 48hrs after heart cath procedure. Resume on 06/06/12.      mulitivitamin with minerals Tabs   Take 1 tablet by mouth daily.      nitroGLYCERIN 0.4 MG SL tablet   Commonly known as: NITROSTAT   Place 0.4 mg under the tongue every 5 (five) minutes as needed.      potassium chloride SA 20 MEQ tablet   Commonly known as: K-DUR,KLOR-CON   Take 20 mEq by mouth daily.      torsemide 20 MG tablet   Commonly known as: DEMADEX   Take 20 mg by mouth every morning.            Disposition   Discharge Orders    Future Appointments: Provider: Department: Dept Phone: Center:   06/17/2012 8:45 AM [facility] [phone] None   06/26/2012 12:45 PM [facility] [phone] None   07/15/2012 11:00 AM Bethann Goo Himmelrich, MS, RD [facility] [phone] NDM     Future Orders Please Complete By Expires   Diet - low sodium heart healthy      Increase activity slowly      Discharge instructions      Comments:   **PLEASE REMEMBER TO BRING ALL OF YOUR MEDICATIONS TO EACH OF YOUR FOLLOW-UP OFFICE VISITS.  * Please do not resume your metformin until 06/06/12  * KEEP GROIN SITE CLEAN AND DRY. Call the office for any signs of bleedings, pus, swelling, increased pain, or any other concerns. * NO HEAVY LIFTING (>10lbs) OR SEXUAL ACTIVITY X 7 DAYS. * NO DRIVING X 2-3 DAYS. * NO SOAKING BATHS, HOT TUBS, POOLS, ETC., X 7 DAYS.      Follow-up Information    Follow up with Glori Bickers, MD on 06/17/2012. (8:45)    Contact information:   Heart Failure Clinic at [facility] [place], [place] [address] [phone]  Parking Garage Code: [id number]           Outstanding Labs/Studies:  None  Duration of Discharge Encounter: Greater than 30 minutes including physician and PA time.  Signed, Murray Hodgkins NP 06/03/2012, 7:05 PM  Patient seen and examined with Ignacia Bayley, NP-C. We discussed all aspects of the encounter. I agree with the assessment and plan as stated above. Cardiac cath normal. If CP persists can consider GI evaluation as an outpatient. Reviewed with patient and family in detail. OK for d/c today after bedrest complete.   Gracieann Stannard,MD 8:12 AM

## 2012-06-05 ENCOUNTER — Emergency Department (HOSPITAL_COMMUNITY)
Admission: EM | Admit: 2012-06-05 | Discharge: 2012-06-06 | Disposition: A | Discharge status: Home / Self Care | Payer: Medicare HMO | Attending: Emergency Medicine | Admitting: Emergency Medicine

## 2012-06-05 ENCOUNTER — Encounter (HOSPITAL_COMMUNITY): Payer: Self-pay | Admitting: *Deleted

## 2012-06-05 DIAGNOSIS — E119 Type 2 diabetes mellitus without complications: Secondary | ICD-10-CM | POA: Insufficient documentation

## 2012-06-05 DIAGNOSIS — G4730 Sleep apnea, unspecified: Secondary | ICD-10-CM | POA: Insufficient documentation

## 2012-06-05 DIAGNOSIS — Z79899 Other long term (current) drug therapy: Secondary | ICD-10-CM | POA: Insufficient documentation

## 2012-06-05 DIAGNOSIS — R079 Chest pain, unspecified: Secondary | ICD-10-CM

## 2012-06-05 DIAGNOSIS — J4489 Other specified chronic obstructive pulmonary disease: Secondary | ICD-10-CM | POA: Insufficient documentation

## 2012-06-05 DIAGNOSIS — I10 Essential (primary) hypertension: Secondary | ICD-10-CM | POA: Insufficient documentation

## 2012-06-05 DIAGNOSIS — I2789 Other specified pulmonary heart diseases: Secondary | ICD-10-CM | POA: Insufficient documentation

## 2012-06-05 DIAGNOSIS — J449 Chronic obstructive pulmonary disease, unspecified: Secondary | ICD-10-CM | POA: Insufficient documentation

## 2012-06-05 NOTE — ED Notes (Signed)
Pt reports headache 4/10, started 2300, chest pain was 5/10, now is 0/10 after laying down, worse when she talks. Reports BP was 300/240 at home, currently 119/55.

## 2012-06-05 NOTE — ED Notes (Signed)
Pt reports her blood pressure is elevated and she has some pressure in the center of her chest.

## 2012-06-06 ENCOUNTER — Emergency Department (HOSPITAL_COMMUNITY): Payer: Medicare HMO

## 2012-06-06 LAB — BASIC METABOLIC PANEL
BUN: 21 mg/dL (ref 6–23)
CO2: 26 mEq/L (ref 19–32)
Calcium: 9.9 mg/dL (ref 8.4–10.5)
Chloride: 103 mEq/L (ref 96–112)
Creatinine, Ser: 1.21 mg/dL — ABNORMAL HIGH (ref 0.50–1.10)
GFR calc Af Amer: 58 mL/min — ABNORMAL LOW (ref >90)
GFR calc non Af Amer: 50 mL/min — ABNORMAL LOW (ref >90)
Glucose, Bld: 142 mg/dL — ABNORMAL HIGH (ref 70–99)
Potassium: 3.9 mEq/L (ref 3.5–5.1)
Sodium: 139 mEq/L (ref 135–145)

## 2012-06-06 LAB — DIFFERENTIAL
Basophils Absolute: 0 10*3/uL (ref 0.0–0.1)
Basophils Relative: 0 % (ref 0–1)
Eosinophils Absolute: 0.4 10*3/uL (ref 0.0–0.7)
Eosinophils Relative: 4 % (ref 0–5)
Lymphocytes Relative: 33 % (ref 12–46)
Lymphs Abs: 3.5 10*3/uL (ref 0.7–4.0)
Monocytes Absolute: 0.7 10*3/uL (ref 0.1–1.0)
Monocytes Relative: 6 % (ref 3–12)
Neutro Abs: 6.2 10*3/uL (ref 1.7–7.7)
Neutrophils Relative %: 57 % (ref 43–77)

## 2012-06-06 LAB — CBC
HCT: 35.6 % — ABNORMAL LOW (ref 36.0–46.0)
Hemoglobin: 12.1 g/dL (ref 12.0–15.0)
MCH: 31.5 pg (ref 26.0–34.0)
MCHC: 34 g/dL (ref 30.0–36.0)
MCV: 92.7 fL (ref 78.0–100.0)
Platelets: 267 10*3/uL (ref 150–400)
RBC: 3.84 MIL/uL — ABNORMAL LOW (ref 3.87–5.11)
RDW: 13 % (ref 11.5–15.5)
WBC: 10.8 10*3/uL — ABNORMAL HIGH (ref 4.0–10.5)

## 2012-06-06 LAB — TROPONIN I: Troponin I: <0.3 ng/mL (ref <0.30)

## 2012-06-06 MED ORDER — HYDROCODONE-ACETAMINOPHEN 5-325 MG PO TABS: 1.0000 | ORAL_TABLET | Freq: Four times a day (QID) | ORAL | Status: DC | PRN

## 2012-06-06 MED ORDER — HYDROCODONE-ACETAMINOPHEN 5-325 MG PO TABS
1.0000 | ORAL_TABLET | Freq: Once | ORAL | Status: AC
  Administered 2012-06-06: 1 via ORAL
  Filled 2012-06-06: qty 1

## 2012-06-06 NOTE — ED Notes (Signed)
MD at bedside. 

## 2012-06-06 NOTE — Discharge Instructions (Signed)
Chest x-ray and cardiac workup negative here tonight. Followup with cardiology in the next few days for irregular Dr. In next few days. Return for any new or worse symptoms. Take pain medicine for the chest pain as needed.

## 2012-06-06 NOTE — ED Provider Notes (Addendum)
History     CSN: [id number]  Arrival date & time 06/05/12  2332   First MD Initiated Contact with Patient 06/05/12 2358      Chief Complaint  Patient presents with  . Hypertension  . Chest Pain    (Consider location/radiation/quality/duration/timing/severity/associated sxs/prior treatment) Patient is a 57 y.o. female presenting with chest pain.  Chest Pain The chest pain began 12 - 24 hours ago. Chest pain occurs constantly. The chest pain is unchanged. At its most intense, the pain is at 10/10. The pain is currently at 10/10. The severity of the pain is moderate. The quality of the pain is described as aching and sharp. The pain radiates to the left shoulder. Exacerbated by: worse with talkingand palpation of the chest. Pertinent negatives for primary symptoms include no fever, no syncope, no shortness of breath, no cough, no wheezing, no abdominal pain, no nausea and no vomiting.  Her past medical history is significant for COPD, diabetes and hypertension.  Procedure history is positive for cardiac catheterization and echocardiogram.     Past Medical History  Diagnosis Date  . CHF (congestive heart failure)     Normal left ventricular systolic function, ejection fraction 55-60% 07/2007 by cath.  . Diabetes mellitus   . Sleep apnea     CPAP use  . Pulmonary HTN     RHC 2008:  RA pressures 13/10 with a mean of 7 mmHg.  RV pressure 123456 with end diastolic pressure of 15 mmHg.  PA pressure 49/23 with a mean of 35 mmHg.  Pulmonary capillary wedge 17/50 with a mean of 14 mmHg. The PA saturation was 68%.  RA saturation was 71% and aortic saturation was 90%.  Cardiac output was 6.0 with a cardiac index of 2.80 by Fick.  Normal coronaries by cath 2008.  . Morbid obesity     s/p lap band surgery  . Asthma   . Arthritis   . Hypertriglyceridemia   . COPD (chronic obstructive pulmonary disease)   . IBS (irritable bowel syndrome)   . Renal insufficiency     Past Surgical History    Procedure Date  . Laparoscopic cholecystectomy 1985  . Cardiac catheterization 2008    w/o coronary artery disease  . Laparoscopic gastric banding 12/12/2010  . Pilonidal cyst excision 1974  . Tumor excision 10/2011    left thigh  . Tumor excision 10/2011    on face    Family History  Problem Relation Age of Onset  . Breast cancer Sister   . Breast cancer Paternal Aunt   . Diabetes Mother   . Heart failure Mother     History  Substance Use Topics  . Smoking status: Former Smoker    Quit date: 08/08/1996  . Smokeless tobacco: Never Used  . Alcohol Use: No    OB History    Grav Para Term Preterm Abortions TAB SAB Ect Mult Living                  Review of Systems  Constitutional: Negative for fever.  HENT: Negative for congestion and neck pain.   Eyes: Negative for visual disturbance.  Respiratory: Negative for cough, shortness of breath and wheezing.   Cardiovascular: Positive for chest pain. Negative for syncope.  Gastrointestinal: Negative for nausea, vomiting and abdominal pain.  Genitourinary: Negative for dysuria.  Musculoskeletal: Negative for back pain.  Skin: Negative for rash.  Neurological: Positive for headaches.  Hematological: Does not bruise/bleed easily.    Allergies  Aspirin;  Ciprofloxacin; and Latex  Home Medications   Current Outpatient Rx  Name Route Sig Dispense Refill  . ALBUTEROL SULFATE HFA 108 (90 BASE) MCG/ACT IN AERS Inhalation Inhale 2 puffs into the lungs every 6 (six) hours as needed. FOR SHORTNESS OF BREATH/WHEEZING    . ALPRAZOLAM 0.25 MG PO TABS Oral Take 0.125 mg by mouth daily as needed.    . BECLOMETHASONE DIPROPIONATE 80 MCG/ACT IN AERS Inhalation Inhale 2 puffs into the lungs 2 (two) times daily as needed. FOR SHORTNESS OF BREATH/WHEEZING    . FENOFIBRATE 160 MG PO TABS Oral Take 160 mg by mouth every morning.     Marland Kitchen FLUTICASONE PROPIONATE 50 MCG/ACT NA SUSP Nasal Place 1-2 sprays into the nose daily.     Marland Kitchen  HYDROCHLOROTHIAZIDE 25 MG PO TABS Oral Take 25 mg by mouth every morning.     Marland Kitchen HYDROCODONE-ACETAMINOPHEN 5-500 MG PO TABS Oral Take 0.5-1 tablets by mouth daily as needed. For pain    . ISOSORBIDE MONONITRATE ER 30 MG PO TB24 Oral Take 30 mg by mouth every morning.     Marland Kitchen LORATADINE 10 MG PO TABS Oral Take 10 mg by mouth daily.     Marland Kitchen LOSARTAN POTASSIUM 100 MG PO TABS Oral Take 100 mg by mouth every morning.     Marland Kitchen METFORMIN HCL 500 MG PO TABS Oral Take 1 tablet (500 mg total) by mouth 2 (two) times daily. Please hold for 48hrs after heart cath procedure. Resume on 06/06/12.    . ADULT MULTIVITAMIN W/MINERALS CH Oral Take 1 tablet by mouth daily.    Marland Kitchen POTASSIUM CHLORIDE CRYS ER 20 MEQ PO TBCR Oral Take 20 mEq by mouth daily.     . TORSEMIDE 20 MG PO TABS Oral Take 20 mg by mouth every morning.    Marland Kitchen HYDROCODONE-ACETAMINOPHEN 5-325 MG PO TABS Oral Take 1-2 tablets by mouth every 6 (six) hours as needed for pain. 10 tablet 0  . NITROGLYCERIN 0.4 MG SL SUBL Sublingual Place 0.4 mg under the tongue every 5 (five) minutes as needed.      BP 129/64  Pulse 89  Temp(Src) 98.4 F (36.9 C) (Oral)  Resp 18  SpO2 95%  Physical Exam  Nursing note and vitals reviewed. Constitutional: She is oriented to person, place, and time. She appears well-developed and well-nourished. No distress.  HENT:  Head: Normocephalic and atraumatic.  Mouth/Throat: Oropharynx is clear and moist.  Eyes: Conjunctivae and EOM are normal. Pupils are equal, round, and reactive to light.  Neck: Normal range of motion. Neck supple.  Cardiovascular: Normal rate, regular rhythm and normal heart sounds.   No murmur heard. Pulmonary/Chest: Effort normal and breath sounds normal. No respiratory distress. She has no wheezes. She has no rales. She exhibits tenderness.       Labs chest is tender to palpation reproducible pain.  Abdominal: Soft. Bowel sounds are normal. There is no tenderness.  Musculoskeletal: Normal range of motion. She  exhibits no edema and no tenderness.  Neurological: She is alert and oriented to person, place, and time. No cranial nerve deficit. She exhibits normal muscle tone. Coordination normal.  Skin: Skin is warm. No rash noted.    ED Course  Procedures (including critical care time)  Labs Reviewed  CBC - Abnormal; Notable for the following:    WBC 10.8 (*)    RBC 3.84 (*)    HCT 35.6 (*)    All other components within normal limits  BASIC METABOLIC PANEL - Abnormal;  Notable for the following:    Glucose, Bld 142 (*)    Creatinine, Ser 1.21 (*)    GFR calc non Af Amer 50 (*)    GFR calc Af Amer 58 (*)    All other components within normal limits  TROPONIN I  DIFFERENTIAL   Dg Chest 2 View  06/06/2012  *RADIOLOGY REPORT*  Clinical Data: Chest pain.  CHEST - 2 VIEW  Comparison: 06/02/2012.  Findings: The cardiac silhouette, mediastinal and hilar contours are within normal limits and stable.  The lungs are clear.  No pleural effusion.  The bony thorax is intact. Mild stable thoracic spine degenerative changes.  IMPRESSION: No acute cardiopulmonary findings.  No change since prior study.  Original Report Authenticated By: P. Kalman Jewels, M.D.   Results for orders placed during the hospital encounter of 06/05/12  TROPONIN I      Component Value Range   Troponin I <0.30  <0.30 (ng/mL)  CBC      Component Value Range   WBC 10.8 (*) 4.0 - 10.5 (K/uL)   RBC 3.84 (*) 3.87 - 5.11 (MIL/uL)   Hemoglobin 12.1  12.0 - 15.0 (g/dL)   HCT 35.6 (*) 36.0 - 46.0 (%)   MCV 92.7  78.0 - 100.0 (fL)   MCH 31.5  26.0 - 34.0 (pg)   MCHC 34.0  30.0 - 36.0 (g/dL)   RDW 13.0  11.5 - 15.5 (%)   Platelets 267  150 - 400 (K/uL)  DIFFERENTIAL      Component Value Range   Neutrophils Relative 57  43 - 77 (%)   Neutro Abs 6.2  1.7 - 7.7 (K/uL)   Lymphocytes Relative 33  12 - 46 (%)   Lymphs Abs 3.5  0.7 - 4.0 (K/uL)   Monocytes Relative 6  3 - 12 (%)   Monocytes Absolute 0.7  0.1 - 1.0 (K/uL)   Eosinophils  Relative 4  0 - 5 (%)   Eosinophils Absolute 0.4  0.0 - 0.7 (K/uL)   Basophils Relative 0  0 - 1 (%)   Basophils Absolute 0.0  0.0 - 0.1 (K/uL)  BASIC METABOLIC PANEL      Component Value Range   Sodium 139  135 - 145 (mEq/L)   Potassium 3.9  3.5 - 5.1 (mEq/L)   Chloride 103  96 - 112 (mEq/L)   CO2 26  19 - 32 (mEq/L)   Glucose, Bld 142 (*) 70 - 99 (mg/dL)   BUN 21  6 - 23 (mg/dL)   Creatinine, Ser 1.21 (*) 0.50 - 1.10 (mg/dL)   Calcium 9.9  8.4 - 10.5 (mg/dL)   GFR calc non Af Amer 50 (*) >90 (mL/min)   GFR calc Af Amer 58 (*) >90 (mL/min)    Date: 06/06/2012  Rate: 92  Rhythm: normal sinus rhythm  QRS Axis: left  Intervals: normal  ST/T Wave abnormalities: nonspecific ST/T changes  Conduction Disutrbances:none  Narrative Interpretation:   Old EKG Reviewed: unchanged no change in EKG from 06/03/2012  1. Chest pain       MDM   Patient status post cardiac catheterization this week without significant findings. Also echo without significant findings. Tonight workup troponins negative EKG without acute changes. Patient's had chest pain all day long so -1 negative troponin is sufficient to rule out significant cardiac event.  Patient's give history of blood pressure being very high at home systolic [id number] and here in the emergency department as been completely final several checks. No evidence  of significant hypertension at this point. Also no evidence of significant headache. Patient comfortable headache is not severe. Headache may be due to taking nitroglycerin at home.  Patient with history of renal insufficiency somewhat improved tonight certainly not worse. Chest x-ray negative for pneumothorax or pneumonia.       Mervin Kung, MD 06/06/12 0200  Mervin Kung, MD 06/06/12 [id number]  Mervin Kung, MD 06/06/12 [id number]  Mervin Kung, MD 06/06/12 [phone]

## 2012-06-06 NOTE — ED Notes (Signed)
Discharge instructions reviewed with pt, questions answered. Pt verbalized understanding.  

## 2012-06-17 ENCOUNTER — Ambulatory Visit (HOSPITAL_COMMUNITY)
Admission: RE | Admit: 2012-06-17 | Discharge: 2012-06-17 | Disposition: A | Discharge status: Home / Self Care | Payer: Medicare HMO | Source: Ambulatory Visit | Attending: Internal Medicine | Admitting: Internal Medicine

## 2012-06-17 ENCOUNTER — Encounter (HOSPITAL_COMMUNITY): Payer: Self-pay

## 2012-06-17 VITALS — BP 140/80 | HR 74 | Ht 60.0 in | Wt 288.1 lb

## 2012-06-17 DIAGNOSIS — I5032 Chronic diastolic (congestive) heart failure: Secondary | ICD-10-CM

## 2012-06-17 DIAGNOSIS — I2789 Other specified pulmonary heart diseases: Secondary | ICD-10-CM | POA: Insufficient documentation

## 2012-06-17 NOTE — Patient Instructions (Addendum)
Follow up as needed

## 2012-06-23 ENCOUNTER — Other Ambulatory Visit (HOSPITAL_COMMUNITY): Payer: Self-pay | Admitting: Family Medicine

## 2012-06-23 DIAGNOSIS — Z1231 Encounter for screening mammogram for malignant neoplasm of breast: Secondary | ICD-10-CM

## 2012-06-26 ENCOUNTER — Encounter (INDEPENDENT_AMBULATORY_CARE_PROVIDER_SITE_OTHER): Payer: Medicare HMO

## 2012-07-06 NOTE — Assessment & Plan Note (Signed)
Coronaries normal on cath. Doubt this CP is cardiac.

## 2012-07-06 NOTE — Assessment & Plan Note (Signed)
We reviewed results of cath and echo together and I reassured her that things look very good. PA pressures just minimally elevated on cath. Symptoms appear out of proportion to objective findings. Stressed need for weight loss, treatment of OSA and exercise. Reinforced need for daily weights and reviewed use of sliding scale diuretics.  She can f/u on PRN basis.

## 2012-07-06 NOTE — Progress Notes (Signed)
Referring Physician: Dr. [name] Primary Care: Dr. Hilma Favors Primary Cardiologist: none Nephrologist: Dr. Hinda Lenis  HPI:  [name] is a 56 y.o. African American female with history of pulmonary hypertension, diabetes, HF, COPD, sleep apnea with CPAP, elevated triglycerides for 15 years and renal insufficiency.   Obesity s/p lap band in 2011.  Also history of IBS and arthritis.  She has been referred for by Dr. [name] for further work up of her dyspnea and pulmonary hypertension.   VQ scan 2008: normal  RHC 2008:  RA pressures 13/10 with a mean of 7 mmHg.  RV pressure 123456 with end diastolic pressure of 15 mmHg.  PA pressure 49/23 with a mean of 35 mmHg.  Pulmonary capillary wedge 17/50 with a mean of 14 mmHg. The PA saturation was 68%.  RA saturation was 71% and aortic saturation was 90%.  Cardiac output was 6.0 with a cardiac index of 2.80 by Fick.  Normal Coronaries per [facility] 2008.  Dr. [name] performed PFTs on 03/13/12 which were normal. DLCO is minimally reduced, but is corrected to some extent when considerations of volume are made.  FVC 1.91 (81%), FEV1 (88%), FEV1/FVC 87 (107%), FEF 25-75% 2.09 (103%), DLCO 75%  RHC 6/4 RA = 9  RV = 42/6/10  PA = 40/17 (24)  PCW = 12  Fick cardiac output/index = 6.0/2.8  PVR = 2.0 Woods units  FA sat = 99%  PA sat = 73%, 76%  SVC sat = 81%  Ao Pressure: 151/71 (103)  LV Pressure: 150/8/14  There was no signficant gradient across the aortic valve on pullback.  LHC 6/4: normal cors  Echo 6/4: LVEF 55-60%.  Trivial TR. Normal RV.    She returns for follow up today. We reviewed cath and echo all of which were essentially normal.  She feels ok.  Chronic 3 pillow orthopnea.  Intermittent lower extremity edema.  No dizziness/syncope.   Says breathing still giving her a issue at times.   Review of Systems: All pertinent positives and negatives as in HPI, otherwise negative.    Past Medical History  Diagnosis Date  . CHF (congestive heart  failure)     Normal left ventricular systolic function, ejection fraction 55-60% 07/2007 by cath.  . Diabetes mellitus   . Sleep apnea     CPAP use  . Pulmonary HTN     RHC 2008:  RA pressures 13/10 with a mean of 7 mmHg.  RV pressure 123456 with end diastolic pressure of 15 mmHg.  PA pressure 49/23 with a mean of 35 mmHg.  Pulmonary capillary wedge 17/50 with a mean of 14 mmHg. The PA saturation was 68%.  RA saturation was 71% and aortic saturation was 90%.  Cardiac output was 6.0 with a cardiac index of 2.80 by Fick.  Normal coronaries by cath 2008.  . Morbid obesity     s/p lap band surgery  . Asthma   . Arthritis   . Hypertriglyceridemia   . COPD (chronic obstructive pulmonary disease)   . IBS (irritable bowel syndrome)   . Renal insufficiency     Current Outpatient Prescriptions  Medication Sig Dispense Refill  . albuterol (PROVENTIL HFA;VENTOLIN HFA) 108 (90 BASE) MCG/ACT inhaler Inhale 2 puffs into the lungs every 6 (six) hours as needed. FOR SHORTNESS OF BREATH/WHEEZING      . ALPRAZolam (XANAX) 0.25 MG tablet Take 0.125 mg by mouth daily as needed.      . beclomethasone (QVAR) 80 MCG/ACT inhaler Inhale 2 puffs into  the lungs 2 (two) times daily as needed. FOR SHORTNESS OF BREATH/WHEEZING      . fenofibrate 160 MG tablet Take 160 mg by mouth every morning.       . fluticasone (FLONASE) 50 MCG/ACT nasal spray Place 1-2 sprays into the nose daily.       Marland Kitchen HYDROcodone-acetaminophen (VICODIN) 5-500 MG per tablet Take 0.5-1 tablets by mouth daily as needed. For pain      . isosorbide mononitrate (IMDUR) 30 MG 24 hr tablet Take 30 mg by mouth every morning.       . loratadine (CLARITIN) 10 MG tablet Take 10 mg by mouth daily.       Marland Kitchen losartan (COZAAR) 100 MG tablet Take 100 mg by mouth every morning.       . Multiple Vitamin (MULITIVITAMIN WITH MINERALS) TABS Take 1 tablet by mouth daily.      . nitroGLYCERIN (NITROSTAT) 0.4 MG SL tablet Place 0.4 mg under the tongue every 5 (five)  minutes as needed.      . potassium chloride SA (K-DUR,KLOR-CON) 20 MEQ tablet Take 20 mEq by mouth daily.       . SitaGLIPtin Phosphate (JANUVIA PO) Take 1 tablet by mouth daily.      Marland Kitchen torsemide (DEMADEX) 20 MG tablet Take 20 mg by mouth every morning. As needed        Allergies  Allergen Reactions  . Aspirin     Rectal bleeding  . Ciprofloxacin     Coughed up blood  . Latex Itching and Rash    PHYSICAL EXAM: Filed Vitals:   06/17/12 1008  BP: 140/80  Pulse: 74  Height: 5' (1.524 m)  Weight: 288 lb 1.9 oz (130.69 kg)  SpO2: 97%    General:  Obese, Well appearing. No respiratory difficulty, mild kyphosis  HEENT: normal Neck: supple. JVP very difficult to assess due to neck habitus. Carotids 2+ bilat; no bruits. No lymphadenopathy or thryomegaly appreciated. Cor: Distant Regular rate & rhythm. No rubs, gallops or murmurs. Lungs: clear Abdomen: obese, soft, nontender, nondistended. No hepatosplenomegaly. No bruits or masses. Good bowel sounds. Extremities: no cyanosis, clubbing, rash, edema Neuro: alert & oriented x 3, cranial nerves grossly intact. moves all 4 extremities w/o difficulty. Affect pleasant.   ASSESSMENT & PLAN:

## 2012-07-15 ENCOUNTER — Ambulatory Visit (HOSPITAL_COMMUNITY): Payer: Medicare HMO

## 2012-07-15 ENCOUNTER — Ambulatory Visit: Payer: Medicare HMO | Admitting: *Deleted

## 2012-07-16 ENCOUNTER — Encounter (INDEPENDENT_AMBULATORY_CARE_PROVIDER_SITE_OTHER): Payer: Self-pay | Admitting: General Surgery

## 2012-07-17 ENCOUNTER — Encounter (INDEPENDENT_AMBULATORY_CARE_PROVIDER_SITE_OTHER): Payer: Medicare HMO

## 2012-07-21 ENCOUNTER — Encounter (INDEPENDENT_AMBULATORY_CARE_PROVIDER_SITE_OTHER): Payer: Self-pay | Admitting: Physician Assistant

## 2012-07-31 HISTORY — PX: HEMORRHOID BANDING: SHX5850

## 2012-11-10 ENCOUNTER — Telehealth (HOSPITAL_COMMUNITY): Payer: Self-pay | Admitting: Cardiology

## 2012-11-10 ENCOUNTER — Ambulatory Visit (HOSPITAL_COMMUNITY)
Admit: 2012-11-10 | Discharge: 2012-11-10 | Disposition: A | Discharge status: Home / Self Care | Payer: Medicare HMO | Attending: Internal Medicine | Admitting: Internal Medicine

## 2012-11-10 DIAGNOSIS — M79609 Pain in unspecified limb: Secondary | ICD-10-CM

## 2012-11-10 DIAGNOSIS — I2789 Other specified pulmonary heart diseases: Secondary | ICD-10-CM
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
## 2012-11-17 ENCOUNTER — Encounter (HOSPITAL_COMMUNITY): Payer: Self-pay | Admitting: *Deleted

## 2012-11-17 ENCOUNTER — Emergency Department (HOSPITAL_COMMUNITY): Payer: Medicare HMO

## 2012-11-17 ENCOUNTER — Emergency Department (HOSPITAL_COMMUNITY)
Admission: EM | Admit: 2012-11-17 | Discharge: 2012-11-17 | Disposition: A | Discharge status: Home / Self Care | Payer: Medicare HMO | Attending: Emergency Medicine | Admitting: Emergency Medicine

## 2012-11-17 ENCOUNTER — Encounter (HOSPITAL_COMMUNITY): Payer: Self-pay

## 2012-11-17 DIAGNOSIS — G4733 Obstructive sleep apnea (adult) (pediatric): Secondary | ICD-10-CM | POA: Insufficient documentation

## 2012-11-17 DIAGNOSIS — E781 Pure hyperglyceridemia: Secondary | ICD-10-CM | POA: Insufficient documentation

## 2012-11-17 DIAGNOSIS — J4489 Other specified chronic obstructive pulmonary disease: Secondary | ICD-10-CM | POA: Insufficient documentation

## 2012-11-17 DIAGNOSIS — Z79899 Other long term (current) drug therapy: Secondary | ICD-10-CM | POA: Insufficient documentation

## 2012-11-17 DIAGNOSIS — R071 Chest pain on breathing: Secondary | ICD-10-CM | POA: Insufficient documentation

## 2012-11-17 DIAGNOSIS — K589 Irritable bowel syndrome without diarrhea: Secondary | ICD-10-CM | POA: Insufficient documentation

## 2012-11-17 DIAGNOSIS — R0602 Shortness of breath: Secondary | ICD-10-CM | POA: Insufficient documentation

## 2012-11-17 DIAGNOSIS — E119 Type 2 diabetes mellitus without complications: Secondary | ICD-10-CM | POA: Insufficient documentation

## 2012-11-17 DIAGNOSIS — Z9884 Bariatric surgery status: Secondary | ICD-10-CM | POA: Insufficient documentation

## 2012-11-17 DIAGNOSIS — Z87891 Personal history of nicotine dependence: Secondary | ICD-10-CM | POA: Insufficient documentation

## 2012-11-17 DIAGNOSIS — M25559 Pain in unspecified hip: Secondary | ICD-10-CM

## 2012-11-17 DIAGNOSIS — S7000XA Contusion of unspecified hip, initial encounter: Secondary | ICD-10-CM

## 2012-11-17 DIAGNOSIS — I270 Primary pulmonary hypertension: Secondary | ICD-10-CM | POA: Insufficient documentation

## 2012-11-17 DIAGNOSIS — Z9889 Other specified postprocedural states: Secondary | ICD-10-CM | POA: Insufficient documentation

## 2012-11-17 DIAGNOSIS — R0600 Dyspnea, unspecified: Secondary | ICD-10-CM

## 2012-11-17 DIAGNOSIS — I509 Heart failure, unspecified: Secondary | ICD-10-CM | POA: Insufficient documentation

## 2012-11-17 DIAGNOSIS — Z8739 Personal history of other diseases of the musculoskeletal system and connective tissue: Secondary | ICD-10-CM | POA: Insufficient documentation

## 2012-11-17 DIAGNOSIS — I2789 Other specified pulmonary heart diseases: Secondary | ICD-10-CM | POA: Insufficient documentation

## 2012-11-17 DIAGNOSIS — M25551 Pain in right hip: Secondary | ICD-10-CM

## 2012-11-17 DIAGNOSIS — J449 Chronic obstructive pulmonary disease, unspecified: Secondary | ICD-10-CM | POA: Insufficient documentation

## 2012-11-17 DIAGNOSIS — J45909 Unspecified asthma, uncomplicated: Secondary | ICD-10-CM | POA: Insufficient documentation

## 2012-11-17 DIAGNOSIS — M129 Arthropathy, unspecified: Secondary | ICD-10-CM | POA: Insufficient documentation

## 2012-11-17 DIAGNOSIS — G4730 Sleep apnea, unspecified: Secondary | ICD-10-CM | POA: Insufficient documentation

## 2012-11-17 LAB — CBC WITH DIFFERENTIAL/PLATELET
Basophils Absolute: 0 10*3/uL (ref 0.0–0.1)
Basophils Relative: 0 % (ref 0–1)
Eosinophils Absolute: 0 10*3/uL (ref 0.0–0.7)
Eosinophils Relative: 0 % (ref 0–5)
HCT: 40.7 % (ref 36.0–46.0)
Hemoglobin: 13.6 g/dL (ref 12.0–15.0)
Lymphocytes Relative: 13 % (ref 12–46)
Lymphs Abs: 1.1 10*3/uL (ref 0.7–4.0)
MCH: 31.5 pg (ref 26.0–34.0)
MCHC: 33.4 g/dL (ref 30.0–36.0)
MCV: 94.2 fL (ref 78.0–100.0)
Monocytes Absolute: 0.1 10*3/uL (ref 0.1–1.0)
Monocytes Relative: 1 % — ABNORMAL LOW (ref 3–12)
Neutro Abs: 7.3 10*3/uL (ref 1.7–7.7)
Neutrophils Relative %: 86 % — ABNORMAL HIGH (ref 43–77)
Platelets: 327 10*3/uL (ref 150–400)
RBC: 4.32 MIL/uL (ref 3.87–5.11)
RDW: 13.1 % (ref 11.5–15.5)
WBC: 8.5 10*3/uL (ref 4.0–10.5)

## 2012-11-17 LAB — COMPREHENSIVE METABOLIC PANEL
ALT: 40 U/L — ABNORMAL HIGH (ref 0–35)
AST: 21 U/L (ref 0–37)
Albumin: 4 g/dL (ref 3.5–5.2)
Alkaline Phosphatase: 56 U/L (ref 39–117)
BUN: 23 mg/dL (ref 6–23)
CO2: 24 mEq/L (ref 19–32)
Calcium: 10.3 mg/dL (ref 8.4–10.5)
Chloride: 100 mEq/L (ref 96–112)
Creatinine, Ser: 1.22 mg/dL — ABNORMAL HIGH (ref 0.50–1.10)
GFR calc Af Amer: 57 mL/min — ABNORMAL LOW (ref >90)
GFR calc non Af Amer: 49 mL/min — ABNORMAL LOW (ref >90)
Glucose, Bld: 322 mg/dL — ABNORMAL HIGH (ref 70–99)
Potassium: 4.5 mEq/L (ref 3.5–5.1)
Sodium: 139 mEq/L (ref 135–145)
Total Bilirubin: 0.3 mg/dL (ref 0.3–1.2)
Total Protein: 7.8 g/dL (ref 6.0–8.3)

## 2012-11-17 LAB — TROPONIN I: Troponin I: <0.3 ng/mL (ref <0.30)

## 2012-11-17 LAB — PRO B NATRIURETIC PEPTIDE: Pro B Natriuretic peptide (BNP): 18.2 pg/mL (ref 0–125)

## 2012-11-17 MED ORDER — OXYCODONE-ACETAMINOPHEN 5-325 MG PO TABS: 1.0000 | ORAL_TABLET | Freq: Four times a day (QID) | ORAL | Status: DC | PRN

## 2012-11-17 MED ORDER — ONDANSETRON 8 MG PO TBDP
8.0000 mg | ORAL_TABLET | Freq: Once | ORAL | Status: AC
  Administered 2012-11-17: 8 mg via ORAL
  Filled 2012-11-17: qty 1

## 2012-11-17 MED ORDER — OXYCODONE-ACETAMINOPHEN 5-325 MG PO TABS: 1.0000 | ORAL_TABLET | ORAL | Status: AC | PRN

## 2012-11-17 MED ORDER — CYCLOBENZAPRINE HCL 10 MG PO TABS: 10.0000 mg | ORAL_TABLET | Freq: Three times a day (TID) | ORAL | Status: DC | PRN

## 2012-11-17 MED ORDER — DEXAMETHASONE SODIUM PHOSPHATE 4 MG/ML IJ SOLN
10.0000 mg | Freq: Once | INTRAMUSCULAR | Status: AC
  Administered 2012-11-17: 10 mg via INTRAMUSCULAR
  Filled 2012-11-17: qty 3

## 2012-11-17 NOTE — ED Notes (Signed)
Pt c/o SOB, here this morning for hip pain; using inhalers and neb treatments without relief

## 2012-11-17 NOTE — ED Provider Notes (Signed)
History    This chart was scribed for Maudry Diego, MD, MD by Rhae Lerner, ED Scribe. The patient was seen in room APA02 and the patient's care was started at 9:49PM.   CSN: [place]:[id number]  Arrival date & time 11/17/12  2127       Chief Complaint  Patient presents with  . Shortness of Breath    (Consider location/radiation/quality/duration/timing/severity/associated sxs/prior treatment) Patient is a 56 y.o. female presenting with shortness of breath. The history is provided by the patient. No language interpreter was used.  Shortness of Breath  The current episode started today. The problem occurs continuously. The problem has been unchanged. The problem is moderate. The symptoms are relieved by beta-agonist inhalers. Associated symptoms include chest pain and shortness of breath. Pertinent negatives include no cough.   Sara Tran is a 56 y.o. female with hx of CHF, DM and pulmonary HTN who presents to the Emergency Department complaining of constant, moderate SOB onset today. Pt was in [facility] ED today for similar symptoms. Pt reports that she has used nebulizer 2x today with minor relief. She reports that she had steroid shot today while in ED. She reports that she has chest pain. The chest pain started PTA. She denies any other symptoms.   PCP Dr. Hilma Favors  Past Medical History  Diagnosis Date  . CHF (congestive heart failure)     Normal left ventricular systolic function, ejection fraction 55-60% 07/2007 by cath.  . Diabetes mellitus   . Sleep apnea     CPAP use  . Pulmonary HTN     RHC 2008:  RA pressures 13/10 with a mean of 7 mmHg.  RV pressure 123456 with end diastolic pressure of 15 mmHg.  PA pressure 49/23 with a mean of 35 mmHg.  Pulmonary capillary wedge 17/50 with a mean of 14 mmHg. The PA saturation was 68%.  RA saturation was 71% and aortic saturation was 90%.  Cardiac output was 6.0 with a cardiac index of 2.80 by Fick.  Normal coronaries by cath 2008.  . Morbid  obesity     s/p lap band surgery  . Asthma   . Arthritis   . Hypertriglyceridemia   . COPD (chronic obstructive pulmonary disease)   . IBS (irritable bowel syndrome)   . Renal insufficiency     Past Surgical History  Procedure Date  . Laparoscopic cholecystectomy 1985  . Cardiac catheterization 2008    w/o coronary artery disease  . Laparoscopic gastric banding 12/12/2010  . Pilonidal cyst excision 1974  . Tumor excision 10/2011    left thigh  . Tumor excision 10/2011    on face    Family History  Problem Relation Age of Onset  . Breast cancer Sister   . Breast cancer Paternal Aunt   . Diabetes Mother   . Heart failure Mother     History  Substance Use Topics  . Smoking status: Former Smoker    Quit date: 08/08/1996  . Smokeless tobacco: Never Used  . Alcohol Use: No    OB History    Grav Para Term Preterm Abortions TAB SAB Ect Mult Living                  Review of Systems  Constitutional: Negative for fatigue.  HENT: Negative for congestion, sinus pressure and ear discharge.   Eyes: Negative for discharge.  Respiratory: Positive for shortness of breath. Negative for cough.   Cardiovascular: Positive for chest pain.  Gastrointestinal: Negative  for abdominal pain and diarrhea.  Genitourinary: Negative for frequency and hematuria.  Musculoskeletal: Negative for back pain.  Skin: Negative for rash.  Neurological: Negative for seizures and headaches.  Hematological: Negative.   Psychiatric/Behavioral: Negative for hallucinations.  All other systems reviewed and are negative.    Allergies  Aspirin; Ciprofloxacin; and Latex  Home Medications   Current Outpatient Rx  Name  Route  Sig  Dispense  Refill  . ALBUTEROL SULFATE HFA 108 (90 BASE) MCG/ACT IN AERS   Inhalation   Inhale 2 puffs into the lungs every 6 (six) hours as needed. FOR SHORTNESS OF BREATH/WHEEZING         . ALPRAZOLAM 0.25 MG PO TABS   Oral   Take 0.125 mg by mouth daily as needed.  Anxiety         . BECLOMETHASONE DIPROPIONATE 80 MCG/ACT IN AERS   Inhalation   Inhale 2 puffs into the lungs 2 (two) times daily as needed. FOR SHORTNESS OF BREATH/WHEEZING         . CYCLOBENZAPRINE HCL 10 MG PO TABS   Oral   Take 1 tablet (10 mg total) by mouth 3 (three) times daily as needed for muscle spasms.   21 tablet   0   . FENOFIBRATE 160 MG PO TABS   Oral   Take 160 mg by mouth every morning.          Marland Kitchen FLUTICASONE PROPIONATE 50 MCG/ACT NA SUSP   Nasal   Place 1-2 sprays into the nose daily.          Marland Kitchen HYDROCODONE-ACETAMINOPHEN 5-500 MG PO TABS   Oral   Take 1 tablet by mouth daily as needed. For pain         . IPRATROPIUM-ALBUTEROL 0.5-2.5 (3) MG/3ML IN SOLN   Nebulization   Take 3 mLs by nebulization every 4 (four) hours as needed. Shortness of Breath         . ISOSORBIDE MONONITRATE ER 30 MG PO TB24   Oral   Take 30 mg by mouth every morning.          Marland Kitchen LORATADINE 10 MG PO TABS   Oral   Take 10 mg by mouth daily.          Marland Kitchen LOSARTAN POTASSIUM 100 MG PO TABS   Oral   Take 100 mg by mouth every morning.          . ADULT MULTIVITAMIN W/MINERALS CH   Oral   Take 1 tablet by mouth daily.         Marland Kitchen NITROGLYCERIN 0.4 MG SL SUBL   Sublingual   Place 0.4 mg under the tongue every 5 (five) minutes as needed. Chest Pains         . OXYCODONE-ACETAMINOPHEN 5-325 MG PO TABS   Oral   Take 1 tablet by mouth every 4 (four) hours as needed for pain.   20 tablet   0   . POTASSIUM CHLORIDE CRYS ER 20 MEQ PO TBCR   Oral   Take 20 mEq by mouth daily.          . TORSEMIDE 20 MG PO TABS   Oral   Take 20 mg by mouth daily as needed. Swelling           BP 148/76  Pulse 99  Temp 97.8 F (36.6 C) (Oral)  Resp 20  Ht 5' (1.524 m)  Wt 279 lb (126.554 kg)  BMI 54.49 kg/m2  SpO2 98%  Physical Exam  Nursing note and vitals reviewed. Constitutional: She is oriented to person, place, and time. She appears well-developed.  HENT:  Head:  Normocephalic and atraumatic.  Eyes: Conjunctivae normal and EOM are normal. No scleral icterus.  Neck: Neck supple. No thyromegaly present.  Cardiovascular: Normal rate and regular rhythm.  Exam reveals no gallop and no friction rub.   No murmur heard. Pulmonary/Chest: Effort normal. No stridor. No respiratory distress. She has no wheezes. She has no rales. She exhibits no tenderness.  Abdominal: She exhibits no distension. There is no tenderness. There is no rebound.  Musculoskeletal: Normal range of motion. She exhibits no edema.  Lymphadenopathy:    She has no cervical adenopathy.  Neurological: She is oriented to person, place, and time. Coordination normal.  Skin: No rash noted. No erythema.  Psychiatric: She has a normal mood and affect. Her behavior is normal.    ED Course  Procedures (including critical care time) DIAGNOSTIC STUDIES: Oxygen Saturation is 98% on room air, normal by my interpretation.    COORDINATION OF CARE: 9:54 PM Discussed ED treatment with pt      Labs Reviewed  CBC WITH DIFFERENTIAL  COMPREHENSIVE METABOLIC PANEL  TROPONIN I  PRO B NATRIURETIC PEPTIDE   Dg Hip Complete Right  11/17/2012  *RADIOLOGY REPORT*  Clinical Data: Hip pain, trauma  RIGHT HIP - COMPLETE 2+ VIEW  Comparison: 12/13/2010  Findings: Pelvic calcification noted compatible with calcified degenerated fibroids.  Limited exam because of patient body habitus.  No displaced fracture or malalignment.  Symmetric mild hip degenerative arthritis.  Slight bilateral SI joint arthropathy as well.  Pelvis appears intact.  No diastasis.  No displaced fracture.  Radiopaque tubing in the left lower quadrant is partially imaged but related to the Laproscopic gastric banding device.  IMPRESSION: Degenerative changes as described.  No acute osseous finding by plain radiography  Calcified degenerated pelvic fibroids   Original Report Authenticated By: Jerilynn Mages. Shick, M.D.    Dg Chest Portable 1  View  11/17/2012  *RADIOLOGY REPORT*  Clinical Data: Shortness of breath.  PORTABLE CHEST - 1 VIEW  Comparison: 06/06/2012.  Findings: The cardiac silhouette, mediastinal and hilar contours are within normal limits given the rotation patient.  The lungs are clear.  No pleural effusion.  The bony thorax is intact.  IMPRESSION: No acute cardiopulmonary findings.   Original Report Authenticated By: Marijo Sanes, M.D.      No diagnosis found.  Date: 11/17/2012  Rate:97  Rhythm: normal sinus rhythm  QRS Axis: left  Intervals: normal  ST/T Wave abnormalities: nonspecific ST changes  Conduction Disutrbances:none  Narrative Interpretation:   Old EKG Reviewed: none available     MDM        The chart was scribed for me under my direct supervision.  I personally performed the history, physical, and medical decision making and all procedures in the evaluation of this patient.Maudry Diego, MD 11/17/12 [phone]

## 2012-11-17 NOTE — ED Notes (Signed)
Pt reporting tightness in chest and some increased SOB beginning about 1 hour ago.  Pt states that she used her nebulizer twice today, and went to church this evening, became SOB while there, no relief from rescue inhaler.  Also reporting edema in lower extremities.  Pt was seen in department earlier today for hip pain.  States she hasn't had any pain medication because she hasn't gotten it filled, but did experience some relief following steroid injection.

## 2012-11-17 NOTE — ED Notes (Signed)
Pt reports has history of arthritis in R hip.  Reports has had increased pain in r hip and swelling for the past few weeks.

## 2012-11-19 NOTE — ED Provider Notes (Signed)
History     CSN: [id number]  Arrival date & time 11/17/12  1047   First MD Initiated Contact with Patient 11/17/12 1158      Chief Complaint  Patient presents with  . Hip Pain    (Consider location/radiation/quality/duration/timing/severity/associated sxs/prior treatment) HPI Comments: Patient c/o increased pain to her right hip.  Reports having hx of arthritis to her hip and states pain feels similar to previous.  States she has Vicodin at home that is not controlling the pain.  States pain is worse with weight bearing and improves with rest.  She denies LE numbness, weakness or edema.  She also denies dysuria, incontinence, saddle anesthesia's, fever, or erythema of the joint  The history is provided by the patient.    Past Medical History  Diagnosis Date  . CHF (congestive heart failure)     Normal left ventricular systolic function, ejection fraction 55-60% 07/2007 by cath.  . Diabetes mellitus   . Sleep apnea     CPAP use  . Pulmonary HTN     RHC 2008:  RA pressures 13/10 with a mean of 7 mmHg.  RV pressure 123456 with end diastolic pressure of 15 mmHg.  PA pressure 49/23 with a mean of 35 mmHg.  Pulmonary capillary wedge 17/50 with a mean of 14 mmHg. The PA saturation was 68%.  RA saturation was 71% and aortic saturation was 90%.  Cardiac output was 6.0 with a cardiac index of 2.80 by Fick.  Normal coronaries by cath 2008.  . Morbid obesity     s/p lap band surgery  . Asthma   . Arthritis   . Hypertriglyceridemia   . COPD (chronic obstructive pulmonary disease)   . IBS (irritable bowel syndrome)   . Renal insufficiency     Past Surgical History  Procedure Date  . Laparoscopic cholecystectomy 1985  . Cardiac catheterization 2008    w/o coronary artery disease  . Laparoscopic gastric banding 12/12/2010  . Pilonidal cyst excision 1974  . Tumor excision 10/2011    left thigh  . Tumor excision 10/2011    on face    Family History  Problem Relation Age of Onset  .  Breast cancer Sister   . Breast cancer Paternal Aunt   . Diabetes Mother   . Heart failure Mother     History  Substance Use Topics  . Smoking status: Former Smoker    Quit date: 08/08/1996  . Smokeless tobacco: Never Used  . Alcohol Use: No    OB History    Grav Para Term Preterm Abortions TAB SAB Ect Mult Living                  Review of Systems  Constitutional: Negative for fever and chills.  Gastrointestinal: Negative for nausea, vomiting and abdominal pain.  Genitourinary: Negative for dysuria, hematuria, flank pain and difficulty urinating.  Musculoskeletal: Positive for joint swelling and arthralgias. Negative for back pain.  Skin: Negative for color change and wound.  All other systems reviewed and are negative.    Allergies  Aspirin; Ciprofloxacin; and Latex  Home Medications   Current Outpatient Rx  Name  Route  Sig  Dispense  Refill  . ALBUTEROL SULFATE HFA 108 (90 BASE) MCG/ACT IN AERS   Inhalation   Inhale 2 puffs into the lungs every 6 (six) hours as needed. FOR SHORTNESS OF BREATH/WHEEZING         . ALPRAZOLAM 0.25 MG PO TABS   Oral   Take  0.125 mg by mouth daily as needed. Anxiety         . BECLOMETHASONE DIPROPIONATE 80 MCG/ACT IN AERS   Inhalation   Inhale 2 puffs into the lungs 2 (two) times daily as needed. FOR SHORTNESS OF BREATH/WHEEZING         . FENOFIBRATE 160 MG PO TABS   Oral   Take 160 mg by mouth every morning.          Marland Kitchen FLUTICASONE PROPIONATE 50 MCG/ACT NA SUSP   Nasal   Place 1-2 sprays into the nose daily.          Marland Kitchen HYDROCODONE-ACETAMINOPHEN 5-500 MG PO TABS   Oral   Take 1 tablet by mouth daily as needed. For pain         . IPRATROPIUM-ALBUTEROL 0.5-2.5 (3) MG/3ML IN SOLN   Nebulization   Take 3 mLs by nebulization every 4 (four) hours as needed. Shortness of Breath         . ISOSORBIDE MONONITRATE ER 30 MG PO TB24   Oral   Take 30 mg by mouth every morning.          Marland Kitchen LORATADINE 10 MG PO TABS    Oral   Take 10 mg by mouth daily.          Marland Kitchen LOSARTAN POTASSIUM 100 MG PO TABS   Oral   Take 100 mg by mouth every morning.          . ADULT MULTIVITAMIN W/MINERALS CH   Oral   Take 1 tablet by mouth daily.         Marland Kitchen NITROGLYCERIN 0.4 MG SL SUBL   Sublingual   Place 0.4 mg under the tongue every 5 (five) minutes as needed. Chest Pains         . POTASSIUM CHLORIDE CRYS ER 20 MEQ PO TBCR   Oral   Take 20 mEq by mouth daily.          . TORSEMIDE 20 MG PO TABS   Oral   Take 20 mg by mouth daily as needed. Swelling         . CYCLOBENZAPRINE HCL 10 MG PO TABS   Oral   Take 1 tablet (10 mg total) by mouth 3 (three) times daily as needed for muscle spasms.   21 tablet   0   . OXYCODONE-ACETAMINOPHEN 5-325 MG PO TABS   Oral   Take 1 tablet by mouth every 4 (four) hours as needed for pain.   20 tablet   0   . OXYCODONE-ACETAMINOPHEN 5-325 MG PO TABS   Oral   Take 1 tablet by mouth every 6 (six) hours as needed for pain.   6 tablet   0     BP 142/69  Pulse 94  Temp 98.7 F (37.1 C) (Oral)  Resp 20  Ht 5' (1.524 m)  Wt 285 lb (129.275 kg)  BMI 55.66 kg/m2  SpO2 97%  Physical Exam  Nursing note and vitals reviewed. Constitutional: She is oriented to person, place, and time. She appears well-developed and well-nourished. No distress.       obese  HENT:  Head: Normocephalic and atraumatic.  Neck: Normal range of motion. Neck supple.  Cardiovascular: Normal rate, regular rhythm and intact distal pulses.   No murmur heard. Pulmonary/Chest: Effort normal and breath sounds normal.  Abdominal: Soft. She exhibits no distension. There is no tenderness. There is no rebound and no guarding.  Musculoskeletal: She exhibits tenderness. She  exhibits no edema.       Right hip: She exhibits decreased range of motion and tenderness. She exhibits normal strength, no bony tenderness, no swelling, no crepitus, no deformity and no laceration.       Lumbar back: She exhibits  tenderness and pain. She exhibits normal range of motion, no swelling, no deformity, no laceration and normal pulse.       Legs: Neurological: She is alert and oriented to person, place, and time. No sensory deficit. She exhibits normal muscle tone. Coordination and gait normal.  Reflex Scores:      Patellar reflexes are 2+ on the right side and 2+ on the left side.      Achilles reflexes are 2+ on the right side and 2+ on the left side. Skin: Skin is warm and dry.    ED Course  Procedures (including critical care time)  Labs Reviewed - No data to display Dg Hip Complete Right  11/17/2012  *RADIOLOGY REPORT*  Clinical Data: Hip pain, trauma  RIGHT HIP - COMPLETE 2+ VIEW  Comparison: 12/13/2010  Findings: Pelvic calcification noted compatible with calcified degenerated fibroids.  Limited exam because of patient body habitus.  No displaced fracture or malalignment.  Symmetric mild hip degenerative arthritis.  Slight bilateral SI joint arthropathy as well.  Pelvis appears intact.  No diastasis.  No displaced fracture.  Radiopaque tubing in the left lower quadrant is partially imaged but related to the Laproscopic gastric banding device.  IMPRESSION: Degenerative changes as described.  No acute osseous finding by plain radiography  Calcified degenerated pelvic fibroids   Original Report Authenticated By: Jerilynn Mages. Shick, M.D.      1. Hip pain, right   2. Arthralgia of hip       MDM    Pt has hx of arthritis of hip.ambulates with a slow but steady gait.  No focal neuro deficits on exam.  Doubt emergent neurological process.    Pt received IM decadron, po zofran  Pt has vicodin at home, not controlling pain.  She agrees to f/u with PMD.   Prescribed: Percocet # 20 Flexeril    [place] Ndiaye L. [place], [place] 11/19/12 2116  Danner Paulding L. [place], [place] 11/19/12 2122

## 2012-11-20 NOTE — ED Provider Notes (Signed)
Medical screening examination/treatment/procedure(s) were performed by non-physician practitioner and as supervising physician I was immediately available for consultation/collaboration. Dontarious Schaum, MD, FACEP   Merisa Julio L Vana Arif, MD 11/20/12 1509 

## 2012-11-24 ENCOUNTER — Other Ambulatory Visit (HOSPITAL_COMMUNITY): Payer: Self-pay | Admitting: Internal Medicine

## 2012-11-24 DIAGNOSIS — M545 Low back pain, unspecified: Secondary | ICD-10-CM

## 2012-11-24 DIAGNOSIS — F4329 Adjustment disorder with other symptoms: Secondary | ICD-10-CM

## 2012-11-24 DIAGNOSIS — R52 Pain, unspecified: Secondary | ICD-10-CM

## 2012-11-24 DIAGNOSIS — M5410 Radiculopathy, site unspecified: Secondary | ICD-10-CM

## 2012-11-25 ENCOUNTER — Other Ambulatory Visit (HOSPITAL_COMMUNITY): Payer: Self-pay | Admitting: Internal Medicine

## 2012-11-25 DIAGNOSIS — M549 Dorsalgia, unspecified: Secondary | ICD-10-CM

## 2012-11-25 DIAGNOSIS — IMO0002 Reserved for concepts with insufficient information to code with codable children: Secondary | ICD-10-CM

## 2012-11-26 ENCOUNTER — Other Ambulatory Visit (HOSPITAL_COMMUNITY): Payer: Medicare HMO

## 2012-11-26 ENCOUNTER — Ambulatory Visit (HOSPITAL_COMMUNITY)
Admission: RE | Admit: 2012-11-26 | Discharge: 2012-11-26 | Disposition: A | Discharge status: Home / Self Care | Payer: Medicare HMO | Source: Ambulatory Visit | Attending: Internal Medicine | Admitting: Internal Medicine

## 2012-11-26 ENCOUNTER — Ambulatory Visit (HOSPITAL_COMMUNITY): Payer: Medicare HMO

## 2012-11-26 DIAGNOSIS — IMO0002 Reserved for concepts with insufficient information to code with codable children: Secondary | ICD-10-CM

## 2012-12-01 ENCOUNTER — Other Ambulatory Visit (HOSPITAL_COMMUNITY): Payer: Self-pay | Admitting: Internal Medicine

## 2012-12-01 DIAGNOSIS — M549 Dorsalgia, unspecified: Secondary | ICD-10-CM

## 2012-12-02 ENCOUNTER — Inpatient Hospital Stay (HOSPITAL_COMMUNITY): Admission: RE | Admit: 2012-12-02 | Payer: Medicare HMO | Source: Ambulatory Visit

## 2012-12-02 ENCOUNTER — Other Ambulatory Visit: Payer: Self-pay | Admitting: Internal Medicine

## 2012-12-02 DIAGNOSIS — M549 Dorsalgia, unspecified: Secondary | ICD-10-CM

## 2012-12-05 ENCOUNTER — Ambulatory Visit
Admission: RE | Admit: 2012-12-05 | Discharge: 2012-12-05 | Disposition: A | Discharge status: Home / Self Care | Payer: Medicare HMO | Source: Ambulatory Visit | Attending: Internal Medicine | Admitting: Internal Medicine

## 2012-12-05 DIAGNOSIS — M549 Dorsalgia, unspecified: Secondary | ICD-10-CM

## 2013-01-19 ENCOUNTER — Other Ambulatory Visit: Payer: Self-pay

## 2013-01-19 DIAGNOSIS — G4700 Insomnia, unspecified: Secondary | ICD-10-CM

## 2013-01-23 ENCOUNTER — Ambulatory Visit: Payer: Medicare HMO | Attending: Pulmonary Disease | Admitting: Sleep Medicine

## 2013-01-23 DIAGNOSIS — G4733 Obstructive sleep apnea (adult) (pediatric): Secondary | ICD-10-CM | POA: Insufficient documentation

## 2013-01-23 DIAGNOSIS — Z6841 Body Mass Index (BMI) 40.0 and over, adult: Secondary | ICD-10-CM | POA: Insufficient documentation

## 2013-01-23 DIAGNOSIS — G4700 Insomnia, unspecified: Secondary | ICD-10-CM

## 2013-01-30 ENCOUNTER — Encounter: Payer: Self-pay | Admitting: Physical Medicine and Rehabilitation

## 2013-02-08 NOTE — Procedures (Signed)
[place] A. [name], MD     [email or web address]        NAME:  Sara Tran, Sara Tran                ACCOUNT NO.:  [id number]  MEDICAL RECORD NO.:  [id number]          PATIENT TYPE:  OUT  LOCATION:  SLEEP LAB                     FACILITY:  [facility]  PHYSICIAN:  Ebbie Sorenson A. [name], M.D. DATE OF BIRTH:  May 16, 1956  DATE OF STUDY:                           NOCTURNAL POLYSOMNOGRAM  REFERRING PHYSICIAN:  Edward L. [name], M.D.  INDICATION:  A 57 year old who presents with hypersomnia, obesity, and snoring.  The study is being done to evaluate for obstructive sleep apnea syndrome.  MEDICATIONS:  Hyoscyamine, Ventolin, potassium, losartan, metformin.  EPWORTH SLEEPINESS SCALE:  8.  BMI 55.  ARCHITECTURAL SUMMARY:  Total recording time is 413 minutes.  Sleep efficiency is 62%.  Sleep latency 4 minutes.  REM latency 37 minutes. Stage N1 2%, N2 26%, N3 16%, and REM sleep 19%.  RESPIRATORY SUMMARY:  Baseline oxygen saturation is 95, lowest saturation 87 during REM sleep.  Diagnostic AHI 10 and RDI 12.  Most of the events occurred almost exclusively during REM sleep.  The REM AHI is 30.  LIMB MOVEMENT SUMMARY:  PLM index 0.  ELECTROCARDIOGRAM SUMMARY:  Average heart rate is 82 with no significant dysrhythmias observed.  IMPRESSION: 1. 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 2. Abnormal sleep architecture with reduced sleep efficiency.     Additionally, there is early REM latency, which can be seen in     narcolepsy or REM rebound phenomenon.  RECOMMENDATIONS: 1. Formal CPAP titration study. 2. Consider Sleep consultation for further evaluation of early REM     latency.  Thanks for this referral.     Corrigan Kretschmer A. [name], M.D.    KAD/MEDQ  D:  02/08/2013 16:57:28  T:  02/08/2013 17:43:09  Job:  [id number]

## 2013-02-11 ENCOUNTER — Ambulatory Visit: Payer: Medicare HMO | Admitting: Gastroenterology

## 2013-03-11 ENCOUNTER — Ambulatory Visit: Payer: Medicare HMO | Admitting: Physical Medicine and Rehabilitation

## 2013-03-11 ENCOUNTER — Encounter: Payer: Medicare HMO | Attending: Physical Medicine and Rehabilitation | Admitting: Physical Medicine and Rehabilitation

## 2013-03-11 ENCOUNTER — Encounter: Payer: Self-pay | Admitting: Gastroenterology

## 2013-03-11 VITALS — BP 163/82 | HR 94 | Resp 16 | Ht <= 58 in | Wt 291.0 lb

## 2013-03-11 DIAGNOSIS — M549 Dorsalgia, unspecified: Secondary | ICD-10-CM

## 2013-03-11 DIAGNOSIS — Q762 Congenital spondylolisthesis: Secondary | ICD-10-CM | POA: Insufficient documentation

## 2013-03-11 DIAGNOSIS — M129 Arthropathy, unspecified: Secondary | ICD-10-CM | POA: Insufficient documentation

## 2013-03-11 DIAGNOSIS — J449 Chronic obstructive pulmonary disease, unspecified: Secondary | ICD-10-CM | POA: Insufficient documentation

## 2013-03-11 DIAGNOSIS — E119 Type 2 diabetes mellitus without complications: Secondary | ICD-10-CM | POA: Insufficient documentation

## 2013-03-11 DIAGNOSIS — G4730 Sleep apnea, unspecified: Secondary | ICD-10-CM | POA: Insufficient documentation

## 2013-03-11 DIAGNOSIS — M47817 Spondylosis without myelopathy or radiculopathy, lumbosacral region: Secondary | ICD-10-CM

## 2013-03-11 DIAGNOSIS — M76899 Other specified enthesopathies of unspecified lower limb, excluding foot: Secondary | ICD-10-CM

## 2013-03-11 DIAGNOSIS — M545 Low back pain, unspecified: Secondary | ICD-10-CM | POA: Insufficient documentation

## 2013-03-11 DIAGNOSIS — M47816 Spondylosis without myelopathy or radiculopathy, lumbar region: Secondary | ICD-10-CM | POA: Insufficient documentation

## 2013-03-11 DIAGNOSIS — M2420 Disorder of ligament, unspecified site: Secondary | ICD-10-CM | POA: Insufficient documentation

## 2013-03-11 DIAGNOSIS — M7060 Trochanteric bursitis, unspecified hip: Secondary | ICD-10-CM | POA: Insufficient documentation

## 2013-03-11 DIAGNOSIS — J4489 Other specified chronic obstructive pulmonary disease: Secondary | ICD-10-CM | POA: Insufficient documentation

## 2013-03-11 DIAGNOSIS — M629 Disorder of muscle, unspecified: Secondary | ICD-10-CM | POA: Insufficient documentation

## 2013-03-11 DIAGNOSIS — M7061 Trochanteric bursitis, right hip: Secondary | ICD-10-CM

## 2013-03-11 DIAGNOSIS — Z5181 Encounter for therapeutic drug level monitoring: Secondary | ICD-10-CM

## 2013-03-11 DIAGNOSIS — N289 Disorder of kidney and ureter, unspecified: Secondary | ICD-10-CM | POA: Insufficient documentation

## 2013-03-11 DIAGNOSIS — I2789 Other specified pulmonary heart diseases: Secondary | ICD-10-CM | POA: Insufficient documentation

## 2013-03-11 NOTE — Patient Instructions (Addendum)
I am recommending some physical therapy to address your low back pain and your hip pain.  I would like them to educate you on proper body mechanics, posture , pain management techniques and pacing your activities.  I would like them to address your hip pain using some modalities such as ultrasound/ice/manual therapy.  We have discussed other medications which might help you with radiating leg pain however you were concerned about side effects.  We discussed possible injections for your back as well as your hips however I think physical therapy would be the best placed to start.  F/u in one month  Bursitis Bursitis is a swelling and soreness (inflammation) of a fluid-filled sac (bursa) that overlies and protects a joint. It can be caused by injury, overuse of the joint, arthritis or infection. The joints most likely to be affected are the elbows, shoulders, hips and knees. HOME CARE INSTRUCTIONS   Apply ice to the affected area for 15 to 20 minutes each hour while awake for 2 days. Put the ice in a plastic bag and place a towel between the bag of ice and your skin.  Rest the injured joint as much as possible, but continue to put the joint through a full range of motion, 4 times per day. (The shoulder joint especially becomes rapidly "frozen" if not used.) When the pain lessens, begin normal slow movements and usual activities.  Only take over-the-counter or prescription medicines for pain, discomfort or fever as directed by your caregiver.  Your caregiver may recommend draining the bursa and injecting medicine into the bursa. This may help the healing process.  Follow all instructions for follow-up with your caregiver. This includes any orthopedic referrals, physical therapy and rehabilitation. Any delay in obtaining necessary care could result in a delay or failure of the bursitis to heal and chronic pain. SEEK IMMEDIATE MEDICAL CARE IF:   Your pain increases even during treatment.  You  develop an oral temperature above 102 F (38.9 C) and have heat and inflammation over the involved bursa. MAKE SURE YOU:   Understand these instructions.  Will watch your condition.  Will get help right away if you are not doing well or get worse. Document Released: 12/14/2000 Document Revised: 03/10/2012 Document Reviewed: 11/18/2009 [facility] Patient Information 2013 [place].

## 2013-03-11 NOTE — Progress Notes (Signed)
Subjective:    Patient ID: Sara Tran, female    DOB: [birth year], 57 y.o.   MRN: [id number]  HPI  57 yo AA women was working at [employer] on 11/10/2012. 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 She reports she did not fall because she held onto the chair. She left work and went to see her cardiologist that day. She states she was worried about her right femoral catheter site and underwent ultrasound of the area. She was told there was some bruising to the lower abdomen from her injury. She went home and evening and reports to trouble walking that week. She went to the emergency room twice that week and and had some x-rays (results unknown). She eventually had an MRI of her spine and she also saw to orthopedic surgeons. She states that they felt surgery was not an option for her at that time.  She has been using oxycodone recently for pain management.  She reports multiple pain complaints including right shoulder right arm low back lateral hip knee and foot pain.  Her main complaints are low back and right lateral hip pain. She states that leaning back increases her low back pain significantly. She cannot lie on her right side at night because of her lateral hip pain. Pain is described as constant sharp burning stabbing and aching average pain is a 10+ plus pain in the office at the time of this evaluation is 8 on a scale of 10  She reports pain worsens with walking and bending improves with rest and medication.  She reports good relief with oxycodone.  She reports a history of some bladder and bowel problems. Trouble walking spasms dizziness tingling numbness weakness denies suicidal ideation.  History of coronary artery disease,  irritable bowel syndrome, and obesity, diabetes mellitus, sleep apnea, COPD/CHF. Hx of Acute on chronic renal insufficiency   Pain  Inventory Average Pain 10 Pain Right Now 8 My pain is constant, sharp, burning, stabbing and aching  In the last 24 hours, has pain interfered with the following? General activity 7 Relation with others 10 Enjoyment of life 10 What TIME of day is your pain at its worst? daytime Sleep (in general) Poor  Pain is worse with: walking, bending and some activites Pain improves with: rest, medication and injections Relief from Meds: 9  Mobility walk without assistance use a cane how many minutes can you walk? 10 ability to climb steps?  yes do you drive?  yes  Function disabled: date disabled 2008 I need assistance with the following:  bathing, meal prep, household duties and shopping  Neuro/Psych bladder control problems bowel control problems weakness numbness tremor tingling trouble walking spasms dizziness  Prior Studies Any changes since last visit?  no  Physicians involved in your care Any changes since last visit?  no   Family History  Problem Relation Age of Onset  . Breast cancer Sister   . Breast cancer Paternal Aunt   . Diabetes Mother   . Heart failure Mother    History   Social History  . Marital Status: Single    Spouse Name: N/A    Number of Children: 0  . Years of Education: N/A   Occupational History  . Volunteers   . 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 Smoking status: Former Smoker    Quit date: 08/08/1996  . Smokeless tobacco: Never Used  . Alcohol Use: No  . Drug Use: No  . Sexually Active: None   Other Topics Concern  . None   Social History Narrative  . None   Past Surgical History  Procedure Laterality Date  . Laparoscopic cholecystectomy  1985  . Cardiac catheterization  2008    w/o coronary artery disease  . Laparoscopic gastric banding  12/12/2010  . Pilonidal cyst excision  1974  . Tumor excision  10/2011    left thigh  . Tumor excision  10/2011    on face  .  Colonoscopy  10/18/10    SLF:2-mm sessile sigmoid polyp/diverticula   Past Medical History  Diagnosis Date  . CHF (congestive heart failure)     Normal left ventricular systolic function, ejection fraction 55-60% 07/2007 by cath.  . Diabetes mellitus   . Sleep apnea     CPAP use  . Pulmonary HTN     RHC 2008:  RA pressures 13/10 with a mean of 7 mmHg.  RV pressure 123456 with end diastolic pressure of 15 mmHg.  PA pressure 49/23 with a mean of 35 mmHg.  Pulmonary capillary wedge 17/50 with a mean of 14 mmHg. The PA saturation was 68%.  RA saturation was 71% and aortic saturation was 90%.  Cardiac output was 6.0 with a cardiac index of 2.80 by Fick.  Normal coronaries by cath 2008.  . Morbid obesity     s/p lap band surgery  . Asthma   . Arthritis   . Hypertriglyceridemia   . COPD (chronic obstructive pulmonary disease)   . IBS (irritable bowel syndrome)   . Renal insufficiency    BP 163/82  Pulse 94  Resp 16  Ht 4\' 10"  (1.473 m)  Wt 291 lb (131.997 kg)  BMI 60.84 kg/m2  SpO2 97%   Review of Systems  Constitutional: Positive for diaphoresis and unexpected weight change.  Respiratory: Positive for apnea, cough, shortness of breath and wheezing.   Cardiovascular: Positive for leg swelling.  Gastrointestinal: Positive for nausea, abdominal pain, diarrhea and constipation.       Bowel Control Problems  Genitourinary: Positive for difficulty urinating.       Bladder control problems  Musculoskeletal: Positive for back pain and gait problem.       Spasms  Neurological: Positive for dizziness, tremors, weakness and numbness.       Tingling  Psychiatric/Behavioral: Positive for sleep disturbance.  All other systems reviewed and are negative.      recent MRI and hip x-rays reviewed. Objective:   Physical Exam  Well-developed morbidly obese woman who appears her stated age and does not appear in any distress  Cranial nerves are grossly intact.  Coordination is grossly  intact Reflexes are 2+ in the upper extremities diminished in both lower extremities  Motor strength 5/5 upper extremities, 5/5 lower extremities except bilateral hip flexors she has difficulty lifting however abdominal peniculus extends over thighs inhibiting lift.  She reports intact sensation in bilateral upper extremities to light touch and pinprick.  She reports decreased sensation right lower extremity below knee.  Transitions from sit to stand slowly.  Gait wide based, slow  Romberg test performed adequately.  Heel toe walking not assessed due to concern regarding balance/obesity  Lumbar forward flexion mildly limited without significant pain however patient deferred extending past midline with lumbar extension citing pain with extension  Internal  and external rotation at the hips does not exacerbate groin or posterior hip pain  Patient was exclusively tender over right greater   slr neg        Assessment & Plan:  1. Lumbago with known facet arthropathy/L4-5 anterolisthesis, pain with  lumbar extension  May have some mild root involvement, the some numbness in the right lower extremity without obvious weakness.  2. Right trochanteric bursitis/iliotibial band syndrome  Plan:  Discussion treatment options today. At this time recommend physical therapy to address low back pain, with emphasis on pain management techniques, education on body mechanics posture, pacing, core strengthening.  Would also like them to address trochanteric bursitis on right using modalities and manual techniques.  I've asked her to start icing her right lateral hip 20 minutes 3-4 times a day.  Her goals include getting back into aquatic therapy, restart an exercise program and be able to walk better.  She is currently on oxycodone. She reports good relief with this medication. She states tramadol does not do anything for her. She is reluctant to try this again.  Urine drug screen done  today  Will follow back up in one month.

## 2013-03-12 ENCOUNTER — Ambulatory Visit (INDEPENDENT_AMBULATORY_CARE_PROVIDER_SITE_OTHER): Payer: Medicare HMO | Admitting: Gastroenterology

## 2013-03-12 ENCOUNTER — Encounter: Payer: Self-pay | Admitting: Gastroenterology

## 2013-03-12 VITALS — BP 166/80 | HR 79 | Temp 98.0°F | Ht <= 58 in | Wt 291.8 lb

## 2013-03-12 DIAGNOSIS — K589 Irritable bowel syndrome without diarrhea: Secondary | ICD-10-CM

## 2013-03-12 DIAGNOSIS — K648 Other hemorrhoids: Secondary | ICD-10-CM

## 2013-03-12 MED ORDER — HYOSCYAMINE SULFATE 0.125 MG SL SUBL: SUBLINGUAL_TABLET | SUBLINGUAL | Status: AC

## 2013-03-12 NOTE — Patient Instructions (Addendum)
SEE ME APR 28 FOR [facility] BANDING. IT WILL TAKE 2-3 SESSIONS.  SEE INFO ABOUT RISK BELOW.  AVOID DIARY AND HIGH FAT MEALS IF YOU ARE HAVING PROBLEMS WITH DIARRHEA ND CRAMPING.  USE LEVSIN AS NEEDED FOR ABDOMINAL CRAMPS AND DIARRHEA.  FOLLOW UP IN 6 MOS.   HEMORRHOIDAL BANDING COMPLICATIONS:  COMMON: 1. MINOR RECTAL PAIN  UNCOMMON: 1. ABSCESS 2. BAND FALLS OFF 3. PROLAPSE OF HEMORRHOIDS AND PAIN 4. ULCER BLEEDING  A. USUALLY SELF-LIMITED: MAY LAST 3-5 DAYS  B. MAY REQUIRE INTERVENTION: 1-2 WEEKS AFTER INTERACTIONS 5. NECROTIZING PELVIC SEPSIS  A. SYMPTOMS: FEVER, PAIN, DIFFICULTY URINATING  Hemorrhoids Hemorrhoids are dilated (enlarged) veins around the rectum. Sometimes clots will form in the veins. This makes them swollen and painful. These are called thrombosed hemorrhoids. Causes of hemorrhoids include:  Constipation.   Straining to have a bowel movement.   HEAVY LIFTING  HOME CARE INSTRUCTIONS  Eat a well balanced diet and drink 6 to 8 glasses of water every day to avoid constipation. You may also use a bulk laxative.   Avoid straining to have bowel movements.   Keep anal area dry and clean.   Do not use a donut shaped pillow or sit on the toilet for long periods. This increases blood pooling and pain.   Move your bowels when your body has the urge; this will require less straining and will decrease pain and pressure.   DO NOT SIT ON THE COMMODE AND READ.

## 2013-03-12 NOTE — Progress Notes (Signed)
Faxed to PCP

## 2013-03-12 NOTE — Assessment & Plan Note (Signed)
USE LEVSIN AS NEEDED. REFILL GIVEN. OPV IN 6 MOS
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
## 2013-03-19 NOTE — Progress Notes (Signed)
Reminder in epic °

## 2013-04-08 ENCOUNTER — Encounter: Payer: Medicare HMO | Admitting: Physical Medicine and Rehabilitation

## 2013-04-09 ENCOUNTER — Ambulatory Visit (HOSPITAL_COMMUNITY)
Admission: RE | Admit: 2013-04-09 | Discharge: 2013-04-09 | Disposition: A | Discharge status: Home / Self Care | Payer: Medicare HMO | Source: Ambulatory Visit | Attending: Physical Medicine and Rehabilitation | Admitting: Physical Medicine and Rehabilitation

## 2013-04-09 ENCOUNTER — Ambulatory Visit (HOSPITAL_COMMUNITY): Payer: Medicare HMO | Admitting: Physical Therapy

## 2013-04-09 DIAGNOSIS — M545 Low back pain, unspecified: Secondary | ICD-10-CM | POA: Insufficient documentation

## 2013-04-09 DIAGNOSIS — M25559 Pain in unspecified hip: Secondary | ICD-10-CM | POA: Insufficient documentation

## 2013-04-09 DIAGNOSIS — M6281 Muscle weakness (generalized): Secondary | ICD-10-CM | POA: Insufficient documentation

## 2013-04-09 DIAGNOSIS — E119 Type 2 diabetes mellitus without complications: Secondary | ICD-10-CM | POA: Insufficient documentation

## 2013-04-09 DIAGNOSIS — IMO0001 Reserved for inherently not codable concepts without codable children: Secondary | ICD-10-CM | POA: Insufficient documentation
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
## 2013-04-14 ENCOUNTER — Ambulatory Visit (HOSPITAL_COMMUNITY)
Admission: RE | Admit: 2013-04-14 | Discharge: 2013-04-14 | Disposition: A | Discharge status: Home / Self Care | Payer: Medicare HMO | Source: Ambulatory Visit | Attending: Physical Medicine and Rehabilitation | Admitting: Physical Medicine and Rehabilitation

## 2013-04-14 NOTE — Progress Notes (Signed)
Physical Therapy Treatment Patient Details  Name: Sara Tran MRN: [id number] Date of Birth: 05/27/1956  Today's Date: 04/14/2013 Time: 1145-1228 PT Time Calculation (min): 43 min Charge: Manual 15', therex 28  Visit#: 2 of 8  Re-eval: 05/09/13    Authorization: [facility]  Authorization Time Period: Mcarthur Rossetti Submitted 04/09/13  Authorization Visit#: 2 of 10   Subjective: Symptoms/Limitations Symptoms: Pt reported LBP 8/10.  Reported compliance with HEP doing the seated exercises.  Pain Assessment Currently in Pain?: Yes Pain Score:   8 Pain Location: Back Pain Orientation: Right;Left  Objective:  Exercise/Treatments Aerobic Tread Mill: 1.2 x 6 min following MET Seated Other Seated Lumbar Exercises: Heel Roll outs 5x10 sec holds Supine Ab Set: Limitations AB Set Limitations: VC and TC for ab set  Sidelying Other Sidelying Lumbar Exercises: Instructed Proper technique getting in and out of bed  Manual Therapy Manual Therapy: Other (comment) Other Manual Therapy: MET for Rt anterior rotation, pubic clearing f/b TM  Physical Therapy Assessment and Plan PT Assessment and Plan Clinical Impression Statement: Manual MET complete for Rt anterior rotation with pubic clearing following.  Pt reported pain reduced with improved hip mobility.  Unable to complete MET for Bil hip outflare due to musculature weakness and uncoordination.  Pt c/o nausea following gait training after MET.  Tactile and vc-ing required with core strengthening exercises for correct musculature activaiton and cueing to continue breathing.   PT Plan: Continue with functional core training, use manual technqiues and modalities to decrease rt hip and back pain ;      Goals    Problem List Patient Active Problem List  Diagnosis  . PULMONARY HYPERTENSION, SECONDARY  . Chronic diastolic heart failure  . OSTEOARTHRITIS  . SLEEP APNEA  . DIARRHEA  . Morbid obesity  . Diabetes mellitus  . IBS  (irritable bowel syndrome)-DIARRHEA  . Chest pain  . Unstable angina  . Acute on chronic renal insufficiency  . Back pain  . Encounter for therapeutic drug monitoring  . Trochanteric bursitis  . Lumbar facet arthropathy  . Hemorrhoids, internal  . Hip pain    PT - End of Session Activity Tolerance: Patient tolerated treatment well;Patient limited by fatigue General Behavior During Session: [facility] for tasks performed Cognition: [facility] for tasks performed  GP    [name] 04/14/2013, 2:28 PM

## 2013-04-15 ENCOUNTER — Encounter: Payer: Medicare HMO | Admitting: Physical Medicine and Rehabilitation

## 2013-04-16 ENCOUNTER — Telehealth (HOSPITAL_COMMUNITY): Payer: Self-pay

## 2013-04-16 ENCOUNTER — Inpatient Hospital Stay (HOSPITAL_COMMUNITY): Admission: RE | Admit: 2013-04-16 | Payer: Medicare HMO | Source: Ambulatory Visit

## 2013-04-21 ENCOUNTER — Ambulatory Visit (HOSPITAL_COMMUNITY): Payer: Medicare HMO | Admitting: Physical Therapy

## 2013-04-22 ENCOUNTER — Other Ambulatory Visit (HOSPITAL_COMMUNITY): Payer: Self-pay | Admitting: Family Medicine

## 2013-04-22 ENCOUNTER — Ambulatory Visit (HOSPITAL_COMMUNITY): Payer: Medicare HMO | Admitting: *Deleted

## 2013-04-22 DIAGNOSIS — Z1231 Encounter for screening mammogram for malignant neoplasm of breast: Secondary | ICD-10-CM

## 2013-04-23 ENCOUNTER — Other Ambulatory Visit: Payer: Self-pay

## 2013-04-23 ENCOUNTER — Ambulatory Visit (HOSPITAL_COMMUNITY)
Admission: RE | Admit: 2013-04-23 | Discharge: 2013-04-23 | Disposition: A | Discharge status: Home / Self Care | Payer: Medicare HMO | Source: Ambulatory Visit | Attending: Physical Medicine and Rehabilitation | Admitting: Physical Medicine and Rehabilitation

## 2013-04-23 ENCOUNTER — Ambulatory Visit (HOSPITAL_COMMUNITY): Payer: Medicare HMO

## 2013-04-23 NOTE — Progress Notes (Signed)
CALLED PT  TO [facility] BANDING SESSION. DISCUSSED OPTIONS. PT ELECTED TO WAIT UNTIL MAY 21. AWARE BANDS CAN BE APPLIED VIA FLEX SIG. WILL CONTACT PT WITHIN THE NEXT WEEK TO WITH APPT DETAILS.

## 2013-04-23 NOTE — Progress Notes (Signed)
Physical Therapy Treatment Patient Details  Name: Sara Tran MRN: [id number] Date of Birth: [birth year]  Today's Date: 04/23/2013 Time: 1350-1430 PT Time Calculation (min): 40 min Charge: Manual 15', therex 23'  Visit#: 3 of 8  Re-eval: 05/09/13    Authorization: [employer] Rossetti Medicare  Authorization Time Period: [employer] Submitted 04/09/13  Authorization Visit#: 3 of 10   Subjective: Symptoms/Limitations Symptoms: Pt reported  LBP pain scale 9/10.  HEP compliance 2x a day..  Pt reported relief following manual last session but feels hip has come out of alignment. Pain Assessment Currently in Pain?: Yes Pain Score:   9 Pain Location: Back Pain Orientation: Right;Lower  Objective:   Exercise/Treatments Aerobic Tread Mill: 1.2 x 6 minutes following MET Seated Other Seated Lumbar Exercises: Heel Roll outs 5x10 sec holds Other Seated Lumbar Exercises: Multifidus 10x 10"; anterior/posterior rotation  Manual Therapy Manual Therapy: Other (comment) Other Manual Therapy: MET for Rt anterior rotation f/b gait training on TM  Physical Therapy Assessment and Plan PT Assessment and Plan Clinical Impression Statement: Manual MET complete for Rt anterior rotation with improved gait mechanics, hip mobility and pain reduced to 4/10 (down 5 levels) following manual.  Multimodal cueing required for approriate musculature for core activaiton, improved every rep with less tactile cueing required.  Pt reported relief with multifidus exercise, pt given printout to add to HEP for strengthening. PT Plan: Continue with functional core training, use manual technqiues and modalities to decrease rt hip and back pain ;      Goals    Problem List Patient Active Problem List  Diagnosis  . PULMONARY HYPERTENSION, SECONDARY  . Chronic diastolic heart failure  . OSTEOARTHRITIS  . SLEEP APNEA  . DIARRHEA  . Morbid obesity  . Diabetes mellitus  . IBS (irritable bowel syndrome)-DIARRHEA  . Chest pain  .  Unstable angina  . Acute on chronic renal insufficiency  . Back pain  . Encounter for therapeutic drug monitoring  . Trochanteric bursitis  . Lumbar facet arthropathy  . Hemorrhoids, internal  . Hip pain    PT - End of Session Activity Tolerance: Patient tolerated treatment well General Behavior During Therapy: WFL for tasks assessed/performed Cognition: WFL for tasks performed  GP    [name] 04/23/2013, 4:17 PM

## 2013-04-28 ENCOUNTER — Ambulatory Visit: Payer: Medicare HMO | Attending: Neurology | Admitting: Sleep Medicine

## 2013-04-28 ENCOUNTER — Ambulatory Visit (HOSPITAL_COMMUNITY)
Admission: RE | Admit: 2013-04-28 | Discharge: 2013-04-28 | Disposition: A | Discharge status: Home / Self Care | Payer: Medicare HMO | Source: Ambulatory Visit | Attending: Physical Medicine and Rehabilitation | Admitting: Physical Medicine and Rehabilitation

## 2013-04-28 VITALS — Ht <= 58 in | Wt 283.0 lb

## 2013-04-28 DIAGNOSIS — G4700 Insomnia, unspecified: Secondary | ICD-10-CM

## 2013-04-28 DIAGNOSIS — G4733 Obstructive sleep apnea (adult) (pediatric): Secondary | ICD-10-CM | POA: Insufficient documentation

## 2013-04-28 DIAGNOSIS — Z6841 Body Mass Index (BMI) 40.0 and over, adult: Secondary | ICD-10-CM | POA: Insufficient documentation

## 2013-04-28 NOTE — Progress Notes (Signed)
Physical Therapy Treatment Patient Details  Name: Sara Tran MRN: [id number] Date of Birth: [birth year]  Today's Date: 04/28/2013 Time: [id number] PT Time Calculation (min): 45 min Charges: 25' manual, 10' TE, 10' self care Visit#: 4 of 8  Re-eval: 05/09/13   Authorization: [employer] Medicare  Authorization Time Period: Mcarthur Rossetti Submitted 04/09/13  Authorization Visit#: 4 of 10   Subjective: Symptoms/Limitations Symptoms: Pt reports that her low back and hip are still very sore.  She says sometimes she is able to move a little faster than other days.  Feels she is improving with therapy.  Pain Assessment Pain Score:   8 Pain Location: Back  Precautions/Restrictions     Exercise/Treatments Stretches Passive Hamstring Stretch: 3 reps;30 seconds (Both) Aerobic Tread Mill: Incline 5, 0.7 mph w/crouched legs Supine Bridge: 5 reps (Both) Straight Leg Raise: 5 reps;Limitations Straight Leg Raises Limitations: Both  Manual Therapy Manual Therapy: Massage Massage: Soft tissue massage to anterior hip musculature to decreae pain and tenderness Other Manual Therapy: MET for Rt anterior rotation f/b gait training on TM  Physical Therapy Assessment and Plan PT Assessment and Plan Clinical Impression Statement: Discussed with pt in length today about importance to continue with strengthening as MET will likely be unable to hold due to weakness to her core and LE.  Pt has significant  PT Plan: Continue with functional core training, use manual technqiues and modalities to decrease rt hip and back pain ;      Goals    Problem List Patient Active Problem List   Diagnosis Date Noted  . Hip pain 04/09/2013  . Hemorrhoids, internal 03/12/2013  . Back pain 03/11/2013  . Encounter for therapeutic drug monitoring 03/11/2013  . Trochanteric bursitis 03/11/2013  . Lumbar facet arthropathy 03/11/2013  . Unstable angina 06/02/2012  . Acute on chronic renal insufficiency 06/02/2012  . Chest  pain 04/22/2012  . IBS (irritable bowel syndrome)-DIARRHEA 01/23/2012  . Morbid obesity 08/09/2011  . Diabetes mellitus 08/09/2011  . DIARRHEA 10/11/2010  . Chronic diastolic heart failure 123456  . PULMONARY HYPERTENSION, SECONDARY 12/12/2007  . OSTEOARTHRITIS 12/11/2007  . SLEEP APNEA 12/11/2007    PT - End of Session Activity Tolerance: Patient tolerated treatment well General Behavior During Therapy: WFL for tasks assessed/performed Cognition: WFL for tasks performed PT Plan of Care PT Home Exercise Plan: updated HEP PT Patient Instructions: discussed adding new HEP activities, importance of strengthening LE and core musculature to hold MET. Consulted and Agree with Plan of Care: Patient  GP    Smith Potenza, PT 04/28/2013, 4:24 PM

## 2013-04-29 ENCOUNTER — Ambulatory Visit (HOSPITAL_COMMUNITY): Payer: Medicare HMO

## 2013-04-30 ENCOUNTER — Ambulatory Visit (HOSPITAL_COMMUNITY)
Admission: RE | Admit: 2013-04-30 | Discharge: 2013-04-30 | Disposition: A | Discharge status: Home / Self Care | Payer: Medicare HMO | Source: Ambulatory Visit | Attending: Physical Medicine and Rehabilitation | Admitting: Physical Medicine and Rehabilitation

## 2013-04-30 DIAGNOSIS — M6281 Muscle weakness (generalized): Secondary | ICD-10-CM | POA: Insufficient documentation

## 2013-04-30 DIAGNOSIS — M545 Low back pain, unspecified: Secondary | ICD-10-CM | POA: Insufficient documentation

## 2013-04-30 DIAGNOSIS — M25559 Pain in unspecified hip: Secondary | ICD-10-CM | POA: Insufficient documentation

## 2013-04-30 DIAGNOSIS — IMO0001 Reserved for inherently not codable concepts without codable children: Secondary | ICD-10-CM | POA: Insufficient documentation

## 2013-04-30 DIAGNOSIS — E119 Type 2 diabetes mellitus without complications: Secondary | ICD-10-CM | POA: Insufficient documentation

## 2013-04-30 NOTE — Progress Notes (Signed)
Physical Therapy Treatment Patient Details  Name: Sara Tran MRN: [id number] Date of Birth: 07/26/1956  Today's Date: 04/30/2013 Time: 1302-1344 PT Time Calculation (min): 42 min Charge; Manual 30', therex 12'  Visit#: 5 of 8  Re-eval: 05/09/13    Authorization: [employer]  Authorization Time Period: Mcarthur Rossetti Submitted 04/09/13  Authorization Visit#: 5 of 10   Subjective: Symptoms/Limitations Symptoms: Pt reported she was wore out and felt very weak following last session, did report pain relief followng last session.  Reported increased Rt groin pain 9/10, Rt hip 7/10, LBP 6/10 Pain Assessment Currently in Pain?: Yes Pain Score:   9 (pain range 7-9/10 groin and LBP) Pain Location: Back Pain Orientation: Right;Lower;Left  Objective:   Exercise/Treatments Stretches Passive Hamstring Stretch: 3 reps;30 seconds Piriformis Stretch: 2 reps;30 seconds;Limitations Piriformis Stretch Limitations: passive Aerobic Tread Mill: Incline 5, 0.7 mph Supine Ab Set: Limitations AB Set Limitations: VC and TC for ab set  Bridge: 5 reps  Manual Therapy Manual Therapy: Massage Massage: Soft tissue massage to Rt groin and gastroc region in supine position with LE elevated to decrease pain and tenderness Other Manual Therapy: MET for Lt outflare f/b gait training on TM  Physical Therapy Assessment and Plan PT Assessment and Plan Clinical Impression Statement: Session focus on reducing pain with manual techniques and educating pt on importance of core strengthening to keep SI within alignment.  Following MET and soft tissue massage to Rt groin and Rt gluteal region pt with increase ease with bridges and with bed mobility as well as improved gait mechanics. PT Plan: Continue with functional core training, use manual technqiues and modalities to decrease rt hip and back pain ;      Goals    Problem List Patient Active Problem List   Diagnosis Date Noted  . Hip pain 04/09/2013  .  Hemorrhoids, internal 03/12/2013  . Back pain 03/11/2013  . Encounter for therapeutic drug monitoring 03/11/2013  . Trochanteric bursitis 03/11/2013  . Lumbar facet arthropathy 03/11/2013  . Unstable angina 06/02/2012  . Acute on chronic renal insufficiency 06/02/2012  . Chest pain 04/22/2012  . IBS (irritable bowel syndrome)-DIARRHEA 01/23/2012  . Morbid obesity 08/09/2011  . Diabetes mellitus 08/09/2011  . DIARRHEA 10/11/2010  . Chronic diastolic heart failure 123456  . PULMONARY HYPERTENSION, SECONDARY 12/12/2007  . OSTEOARTHRITIS 12/11/2007  . SLEEP APNEA 12/11/2007    PT - End of Session Activity Tolerance: Patient tolerated treatment well General Behavior During Therapy: WFL for tasks assessed/performed Cognition: WFL for tasks performed  GP    [name] 04/30/2013, 4:35 PM

## 2013-05-05 ENCOUNTER — Ambulatory Visit (HOSPITAL_COMMUNITY)
Admission: RE | Admit: 2013-05-05 | Discharge: 2013-05-05 | Disposition: A | Discharge status: Home / Self Care | Payer: Medicare HMO | Source: Ambulatory Visit | Attending: Physical Medicine and Rehabilitation | Admitting: Physical Medicine and Rehabilitation

## 2013-05-05 NOTE — Progress Notes (Signed)
Physical Therapy Treatment Patient Details  Name: Sara Tran MRN: [id number] Date of Birth: 02-23-1956  Today's Date: 05/05/2013 Time: [id number] PT Time Calculation (min): 57 min Charge: Manual 30', Therex 25'  Visit#: 6 of 8  Re-eval: 05/09/13    Authorization: [employer]  Authorization Time Period: [employer] Submitted 04/09/13  Authorization Visit#: 6 of 10   Subjective: Symptoms/Limitations Symptoms: Pt reported she feels like she is doing the penguin walk today, decreased LBP today 6/10. Pain Assessment Currently in Pain?: Yes Pain Score:   6 Pain Location: Back Pain Orientation: Lower;Right  Objective:   Exercise/Treatments Stretches Piriformis Stretch: 2 reps;30 seconds;Limitations Aerobic Tread Mill: Incline 5, 1.0 mph Standing Functional Squats: 5 reps Other Standing Lumbar Exercises: gastroc st w/ slant board 3x 30" Seated Other Seated Lumbar Exercises: Heel Roll outs 5x10 sec holds Supine Ab Set: Limitations AB Set Limitations: VC and TC for ab set  Bridge: 5 reps Other Supine Lumbar Exercises: windshield wipers 10x  Manual Therapy Manual Therapy: Massage Massage: Soft tissue massage to gluteal region and lower back region with hawkgrip2 to decrease tightness and pain Other Manual Therapy: MET for Rt anterior rotation f/b gait training on TM x 5'  Physical Therapy Assessment and Plan PT Assessment and Plan Clinical Impression Statement: MET for Rt anterior rotation f/b gait training on TM with improved hip mobility, decreased pain and improved gait mechanincs.  Pt improving awareness of posture with less cueing required, able to verbalize appropriate posture form. Added squats for LE strengthening with vc-ing for form.  Pt reported pain reduced to 2/10 end of session following STM to lower back and gluteal region. PT Plan: Continue with functional core training, use manual technqiues and modalities to decrease rt hip and back pain ;      Goals     Problem List Patient Active Problem List   Diagnosis Date Noted  . Hip pain 04/09/2013  . Hemorrhoids, internal 03/12/2013  . Back pain 03/11/2013  . Encounter for therapeutic drug monitoring 03/11/2013  . Trochanteric bursitis 03/11/2013  . Lumbar facet arthropathy 03/11/2013  . Unstable angina 06/02/2012  . Acute on chronic renal insufficiency 06/02/2012  . Chest pain 04/22/2012  . IBS (irritable bowel syndrome)-DIARRHEA 01/23/2012  . Morbid obesity 08/09/2011  . Diabetes mellitus 08/09/2011  . DIARRHEA 10/11/2010  . Chronic diastolic heart failure 123456  . PULMONARY HYPERTENSION, SECONDARY 12/12/2007  . OSTEOARTHRITIS 12/11/2007  . SLEEP APNEA 12/11/2007    PT - End of Session Activity Tolerance: Patient tolerated treatment well General Behavior During Therapy: WFL for tasks assessed/performed Cognition: WFL for tasks performed  GP    Sara Tran 05/05/2013, 2:20 PM

## 2013-05-07 ENCOUNTER — Ambulatory Visit (HOSPITAL_COMMUNITY)
Admission: RE | Admit: 2013-05-07 | Discharge: 2013-05-07 | Disposition: A | Discharge status: Home / Self Care | Payer: Medicare HMO | Source: Ambulatory Visit | Attending: Family Medicine | Admitting: Family Medicine

## 2013-05-07 NOTE — Progress Notes (Signed)
Physical Therapy Treatment Patient Details  Name: Sara Tran MRN: [id number] Date of Birth: [birth year]  Today's Date: 05/07/2013 Time: 1306-1350 PT Time Calculation (min): 44 min Charge: Massage x 15',  Therex 28'  Visit#: 7 of 8  Re-eval: 05/09/13    Authorization: [employer]  Authorization Time Period: [employer] Submitted 04/09/13  Authorization Visit#: 7 of 10   Subjective: Symptoms/Limitations Symptoms: Pt reported her back is feeling good today, increase ease walking into therapy today. Pain Assessment Currently in Pain?: Yes Pain Score:   6 Pain Location: Back Pain Orientation: Lower;Right  Objective:   Exercise/Treatments Stretches Passive Hamstring Stretch: 3 reps;30 seconds Piriformis Stretch: 2 reps;30 seconds;Limitations Piriformis Stretch Limitations: passive Standing Heel Raises: 10 reps;Limitations Heel Raises Limitations: Toe raises 10 x Functional Squats: 10 reps;Limitations Functional Squats Limitations: vc for form Supine Bent Knee Raise: 5 reps;5 seconds Bridge: 10 reps Seated PFC 5x 10"  Manual Therapy Manual Therapy: Massage Massage: Soft tissue massage to Rt pirifomis and gluteal region and hamstring with hawkgrip2 to decrease tightness and pain  Physical Therapy Assessment and Plan PT Assessment and Plan Clinical Impression Statement: SI within alignment this session, no MET required.  Pt improving core musculature coordination with less vc/tc-ing required.  Soft tissue massage complete to Rt gluteal region with noted spasms to Rt piriformis and hamstring region, able to reduce but not fully resolve spasms. PT Plan: Re-eval next session.  Continue with functional core training, use manual technqiues and modalities to decrease rt hip and back pain ;      Goals    Problem List Patient Active Problem List   Diagnosis Date Noted  . Hip pain 04/09/2013  . Hemorrhoids, internal 03/12/2013  . Back pain 03/11/2013  . Encounter for  therapeutic drug monitoring 03/11/2013  . Trochanteric bursitis 03/11/2013  . Lumbar facet arthropathy 03/11/2013  . Unstable angina 06/02/2012  . Acute on chronic renal insufficiency 06/02/2012  . Chest pain 04/22/2012  . IBS (irritable bowel syndrome)-DIARRHEA 01/23/2012  . Morbid obesity 08/09/2011  . Diabetes mellitus 08/09/2011  . DIARRHEA 10/11/2010  . Chronic diastolic heart failure 123456  . PULMONARY HYPERTENSION, SECONDARY 12/12/2007  . OSTEOARTHRITIS 12/11/2007  . SLEEP APNEA 12/11/2007    PT - End of Session Activity Tolerance: Patient tolerated treatment well General Behavior During Therapy: WFL for tasks assessed/performed Cognition: WFL for tasks performed  GP    [name] 05/07/2013, 2:43 PM

## 2013-05-08 NOTE — Procedures (Signed)
[place] A. [name], MD     [email or web address]        Sara Tran, Sara Tran                ACCOUNT NO.:  [id number]  MEDICAL RECORD NO.:  [id number]          PATIENT TYPE:  OUT  LOCATION:  SLEEP LAB                     FACILITY:  [facility]  PHYSICIAN:  Liliauna Santoni A. [name], M.D. DATE OF BIRTH:  04-24-1956  DATE OF STUDY:  04/28/2013                           NOCTURNAL POLYSOMNOGRAM  REFERRING PHYSICIAN:  Halford Chessman, M.D.  INDICATION FOR STUDY:  A 57 year old who presents with a known history of obstructive sleep apnea syndrome documented by prior sleep study. This is a CPAP titration recording.  EPWORTH SLEEPINESS SCORE:  8.  BMI 61.  MEDICATIONS:  Cozaar, Imdur, TriCor, potassium, metformin, Claritin, oxycodone, amlodipine, Ambien.  SLEEP ARCHITECTURE:  The total recording time is 368 minutes.  Sleep efficiency 83%.  Sleep latency 16 minutes.  REM latency 103 minutes. Stage N1 is 2%, N2 56%, N3 15%, and REM sleep 26%.  RESPIRATORY DATA:  Baseline oxygen saturation is 95, lowest saturation 92 during non-REM sleep.  The patient was placed on positive pressures, starting at 6 and titrated to 9.  Optimal pressure is 9 with resolution of obstructive events.  CARDIAC DATA:  Average heart rate is 78 with no significant dysrhythmias observed.  MOVEMENT-PARASOMNIA:  PLM index 0.  IMPRESSIONS-RECOMMENDATIONS:  Obstructive sleep apnea syndrome, which responds well to a CPAP of 9.     Lenward Able A. [name], M.D.    KAD/MEDQ  D:  05/08/2013 18:53:58  T:  05/08/2013 19:19:48  Job:  [id number]

## 2013-05-12 ENCOUNTER — Ambulatory Visit (HOSPITAL_COMMUNITY)
Admission: RE | Admit: 2013-05-12 | Discharge: 2013-05-12 | Disposition: A | Discharge status: Home / Self Care | Payer: Medicare HMO | Source: Ambulatory Visit | Attending: Physical Medicine and Rehabilitation | Admitting: Physical Medicine and Rehabilitation

## 2013-05-13 ENCOUNTER — Encounter: Payer: Medicare HMO | Attending: Physical Medicine and Rehabilitation | Admitting: Physical Medicine and Rehabilitation

## 2013-05-13 ENCOUNTER — Encounter: Payer: Self-pay | Admitting: Physical Medicine and Rehabilitation

## 2013-05-13 ENCOUNTER — Ambulatory Visit (HOSPITAL_COMMUNITY): Payer: Medicare HMO

## 2013-05-13 VITALS — BP 169/71 | HR 77 | Resp 16 | Ht <= 58 in | Wt 287.2 lb

## 2013-05-13 DIAGNOSIS — M76899 Other specified enthesopathies of unspecified lower limb, excluding foot: Secondary | ICD-10-CM | POA: Insufficient documentation

## 2013-05-13 DIAGNOSIS — M7061 Trochanteric bursitis, right hip: Secondary | ICD-10-CM
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
## 2013-05-13 NOTE — Patient Instructions (Signed)
Joint Injection Care After Refer to this sheet in the next few days. These instructions provide you with information on caring for yourself after you have had a joint injection. Your caregiver also may give you more specific instructions. Your treatment has been planned according to current medical practices, but problems sometimes occur. Call your caregiver if you have any problems or questions after your procedure. After any type of joint injection, it is not uncommon to experience:  Soreness, swelling, or bruising around the injection site.  Mild numbness, tingling, or weakness around the injection site caused by the numbing medicine used before or with the injection. It also is possible to experience the following effects associated with the specific agent after injection:  Iodine-based contrast agents:  Allergic reaction (itching, hives, widespread redness, and swelling beyond the injection site).  Corticosteroids (These effects are rare.):  Allergic reaction.  Increased blood sugar levels (If you have diabetes and you notice that your blood sugar levels have increased, notify your caregiver).  Increased blood pressure levels.  Mood swings.    Temporary heat or redness.  Temporary rash and itching.  Increased fluid accumulation in the injected joint. These effects all should resolve within a day after your procedure.  HOME CARE INSTRUCTIONS  Limit yourself to light activity the day of your procedure. Avoid lifting heavy objects, bending, stooping, or twisting.  Take prescription or over-the-counter pain medication as directed by your caregiver.  You may apply ice to your injection site to reduce pain and swelling the day of your procedure. Ice may be applied 3 to 4 times:  Put ice in a plastic bag.  Place a towel between your skin and the bag.  Leave the ice on for no longer than 15 to 20 minutes each time. SEEK IMMEDIATE MEDICAL CARE IF:   Pain and swelling get  worse rather than better or extend beyond the injection site.  Numbness does not go away.  Blood or fluid continues to leak from the injection site.  You have chest pain.  You have swelling of your face or tongue.  You have trouble breathing or you become dizzy.  You develop a fever, chills, or severe tenderness at the injection site that last longer than 1 day. MAKE SURE YOU:  Understand these instructions.  Watch your condition.  Get help right away if you are not doing well or if you get worse. Document Released: 08/30/2011 Document Revised: 03/10/2012 Document Reviewed: 08/30/2011 [facility] Patient Information 2013 [place].    Continue PT   F/u in 6 weeks

## 2013-05-14 ENCOUNTER — Ambulatory Visit (HOSPITAL_COMMUNITY)
Admission: RE | Admit: 2013-05-14 | Discharge: 2013-05-14 | Disposition: A | Discharge status: Home / Self Care | Payer: Medicare HMO | Source: Ambulatory Visit | Attending: Physical Medicine and Rehabilitation | Admitting: Physical Medicine and Rehabilitation

## 2013-05-14 NOTE — Progress Notes (Signed)
Physical Therapy Re-evaluation/Treatment  Patient Details  Name: MEKO MORTIER MRN: [id number] Date of Birth: 1956/03/03  Today's Date: 05/14/2013 Time: 1302-1355 PT Time Calculation (min): 53 min Charge: MMT, ROM Measurement, therex 23', Massage x 15', self care x8'              Visit#: 9 of 12  Re-eval: 06/11/13 Assessment Diagnosis: LBP/Rt hip pain Next MD Visit: Dr. Artist Beach - 04/13/12  Authorization: [employer]    Authorization Time Period: [employer] Submitted Covered until 5/26  Authorization Visit#: 9 of 20   Subjective Symptoms/Limitations Symptoms: Pt reported receiveing a cortisone in greater trochanter of Rt hip yesterday.  Pt rated pain scale 8/10. How long can you sit comfortably?: 30 minutes to an hour (still has difficulty sitting through church) How long can you stand comfortably?: 10-15 minutes (was 5-10 minutes) How long can you walk comfortably?: 10-15 minutes with more tolerable pain  (was 5-10 minutes, still has SOB) Pain Assessment Currently in Pain?: Yes Pain Score:   8 Pain Location: Back Pain Orientation: Lower;Right  Objective:   Sensation/Coordination/Flexibility/Functional Tests Functional Tests Functional Tests: ODI 80%  Assessment RLE Strength Right Hip Flexion: 3/5 (was 2-/5) Right Hip Extension: 3/5 (was 2/5) Right Hip ABduction: 3+/5 Right Hip ADduction: 4/5 Right Knee Flexion: 4/5 (was 3-/5) Right Knee Extension: 4/5 (was 3+/5) LLE Strength Left Hip Flexion: 3/5 (was 2/5) Left Hip Extension: 3/5 (was 2+/5) Left Hip ABduction: 3+/5 (was 3+/5) Left Knee Flexion: 4/5 (was 3-/5) Left Knee Extension: 4/5 (was 4/5) Lumbar AROM Lumbar Flexion: decreased 40% (decreased 40%) Lumbar Extension: WNL (decreased 15% - shooting pain) Lumbar - Right Side Bend: decreased 5% - feels blocked (decreased 15% - "feels blocked") Lumbar - Left Side Bend: WNL (was decreased 5% ) Palpation Palpation: pain and tenderness to rt lumbar region -  decreased after muscle energy technique  Exercise/Treatments Mobility/Balance  Posture/Postural Control Posture/Postural Control: No significant limitations (was slouched posutre)   Stretches Passive Hamstring Stretch: 3 reps;30 seconds Piriformis Stretch: 2 reps;30 seconds;Limitations Piriformis Stretch Limitations: passive Aerobic Tread Mill: Gait training incline 2 x 6 minutes for activity tolerance and gait training Standing Heel Raises: 10 reps;Limitations Heel Raises Limitations: Toe raises 10 x Functional Squats: 10 reps;Limitations Functional Squats Limitations: vc for form Seated Other Seated Lumbar Exercises: PFC 5x 10"  Manual Therapy Manual Therapy: Massage Massage: Soft tissue massage to Rt pirifomis and gluteal region and hamstring with hawkgrip2 to decrease tightness and pain  Physical Therapy Assessment and Plan PT Assessment and Plan Clinical Impression Statement: Re-eval complete, Mrs Zender has had 9 OPPT sessions over 5 weeks with the following findings:  Pt is independent with HEP.  Pt reported pain scale on average various from 5-10/10 Rt hip and LBP.  Overall Bil LE strength has improved and pt has ability to ambulate with proper gait mechanics for 10 minutes then demonstrates antalgic gait mechanics from decreased activity tolerance and pain.  Pt overall core coordination is improving however still required multimodal cueing for correct musculature activation.  ODI 80% perceived functional ability impaired.   PT Plan: Recommend continuing current POC for 4 more weeks to address impairments.  Continue with functional core training, use manual technqiues and modalities to decrease rt hip and back pain ;      Goals Home Exercise Program Pt will Perform Home Exercise Program: Independently PT Goal: Perform Home Exercise Program - Progress: Met (daily) PT Short Term Goals Time to Complete Short Term Goals: 3 weeks PT Short Term Goal  1: Pt will report pain less  than 4/10 for 50% of her day.  PT Short Term Goal 1 - Progress: Not met (pain range 5-10/10) PT Short Term Goal 2: Pt will improve her hip strength by 1 muscle grade in order to tolerate ambulating for greater than 10 minutes.  PT Short Term Goal 2 - Progress: Progressing toward goal PT Short Term Goal 3: Pt will improve core coordination in order to present with normalized posture.  PT Short Term Goal 3 - Progress: Progressing toward goal PT Long Term Goals Time to Complete Long Term Goals:  (6 weeks) PT Long Term Goal 1: Pt will report pain less than 3/10 for 75% of her day for improved QOL. PT Long Term Goal 1 - Progress: Not met PT Long Term Goal 2: Pt will improve her LE strength and core strength to WNL in order to tolerate walking for greater than 30 minutes to complete community activities.  PT Long Term Goal 2 - Progress: Not met Long Term Goal 3: Pt will improve her ODI to less than 25% for improved percieved functional ability.  Long Term Goal 3 Progress: Not met (ODI 80% ) Long Term Goal 4: Pt will decreae pain and tenderness to low back and hip area for imrpoved QOL.  Long Term Goal 4 Progress: Progressing toward goal  Problem List Patient Active Problem List   Diagnosis Date Noted  . Hip pain 04/09/2013  . Hemorrhoids, internal 03/12/2013  . Back pain 03/11/2013  . Encounter for therapeutic drug monitoring 03/11/2013  . Trochanteric bursitis 03/11/2013  . Lumbar facet arthropathy 03/11/2013  . Unstable angina 06/02/2012  . Acute on chronic renal insufficiency 06/02/2012  . Chest pain 04/22/2012  . IBS (irritable bowel syndrome)-DIARRHEA 01/23/2012  . Morbid obesity 08/09/2011  . Diabetes mellitus 08/09/2011  . DIARRHEA 10/11/2010  . Chronic diastolic heart failure 123456  . PULMONARY HYPERTENSION, SECONDARY 12/12/2007  . OSTEOARTHRITIS 12/11/2007  . SLEEP APNEA 12/11/2007    PT - End of Session Activity Tolerance: Patient tolerated treatment  well General Behavior During Therapy: WFL for tasks assessed/performed Cognition: WFL for tasks performed PT Plan of Care PT Patient Instructions: discussed adding new HEP activities, importance of strengthening LE and core musculature to hold MET.  GP Functional Assessment Tool Used: clinical observation, ODI Functional Limitation: Mobility: Walking and moving around Mobility: Walking and Moving Around Current Status [phone]): At least 60 percent but less than 80 percent impaired, limited or restricted Mobility: Walking and Moving Around Goal Status [phone]): At least 40 percent but less than 60 percent impaired, limited or restricted  [name] 05/14/2013, 3:59 PM

## 2013-05-14 NOTE — Evaluation (Cosign Needed)
Physical Therapy Re-Evaluation  Patient Details  Name: NOEMY PELLERIN MRN: [id number] Date of Birth: 1956/02/15  Today's Date: 05/12/2013 Time: 1359-1430 PT Time Calculation (min): 31 min Charges: 1 ROM, 1 MMT, manual x15, self care 10 minutes             Visit#: 8 of 8  Re-eval: 05/09/13 Assessment Diagnosis: LBP/Rt hip pain Next MD Visit: Dr. Artist Beach - 04/13/12  Authorization: [employer]    Authorization Time Period: [employer] Submitted Covered until 5/26  Authorization Visit#: 8 of 10   Subjective Symptoms/Limitations Symptoms: Pt reports that she will see her pain managment MD tomorrow.  She feels that therapy is helping "It is giving me a sense of control with my exercises."  She is having difficulty sleeping, irritability, difficulty breathing with allergies.  How long can you sit comfortably?: 30 minutes (still has difficulty sitting through church) How long can you stand comfortably?: 10-15 minutes (was 5-10 minutes) How long can you walk comfortably?: 10-15 minutes with more tolerable pain  (was 5-10 minutes, still has SOB) Pain Assessment Currently in Pain?: Yes Pain Score:   8 Pain Location: Abdomen Pain Orientation: Right;Lower Pain Type: Acute pain Pain Onset: More than a month ago Pain Frequency: Constant Pain Relieving Factors: ice pack, oxycodone (3x/day) Effect of Pain on Daily Activities: difficulty exercising, showering (is reduced to 1x a week due to pain), bending, stooping, cooking  RLE Strength Right Hip Flexion: 3/5 (was 2-/5) Right Hip Extension: 3/5 (was 2/5) Right Hip ABduction: 3+/5 Right Hip ADduction: 4/5 Right Knee Flexion: 4/5 (was 3-/5) Right Knee Extension: 4/5 (was 3+/5) LLE Strength Left Hip Flexion: 3/5 (was 2/5) Left Hip Extension: 3/5 (was 2+/5) Left Hip ABduction: 3+/5 (was 3+/5) Left Knee Flexion: 4/5 (was 3-/5) Left Knee Extension: 4/5 (was 4/5) Lumbar AROM Lumbar Flexion: decreased 40% (decreased 40%) Lumbar Extension:  WNL (decreased 15% - shooting pain) Lumbar - Right Side Bend: decreased 5% - feels blocked (decreased 15% - "feels blocked") Lumbar - Left Side Bend: WNL (was decreased 5% ) Palpation Palpation: pain and tenderness to rt lumbar region - decreased after muscle energy technique  Mobility/Balance  Posture/Postural Control Posture/Postural Control: No significant limitations (was slouched posutre)   Exercise/Treatments Manual Therapy Manual Therapy: Other (comment) Other Manual Therapy: MET for Rt outflare f/b gait training on TM x 5'  Physical Therapy Assessment and Plan PT Assessment and Plan Clinical Impression Statement: Re-eval complete, Mrs Besselman has had 8 OPPT sessions over 4 weeks with the following findings: Pt is independent with HEP. Pt reported pain scale on average various from 5-10/10 Rt hip and LBP.  Overall Bil LE strength has improved and pt has ability to ambulate with proper gait mechanics for 10 minutes then demonstrates antalgic gait mechanics from decreased activity tolerance and pain. Pt overall core coordination is improving however still required multimodal cueing for correct musculature activation. At this time she continues to recieve help from the pain management clinic and would continue to benefit from skilled OP PT in order to address these impairments for 1 more week.  PT Plan: Continue with core stabilzation and LE strengtheing, gait mechanics until 5/26 when her [employer] would require re-evaluation.     Goals Home Exercise Program Pt will Perform Home Exercise Program: Independently PT Goal: Perform Home Exercise Program - Progress: Met PT Short Term Goals Time to Complete Short Term Goals: 3 weeks PT Short Term Goal 1: Pt will report pain less than 4/10 for 50% of her day.  PT Short Term Goal 1 - Progress: Not met PT Short Term Goal 2: Pt will improve her hip strength by 1 muscle grade in order to tolerate ambulating for greater than 10 minutes.  PT Short  Term Goal 2 - Progress: Progressing toward goal PT Short Term Goal 3: Pt will improve core coordination in order to present with normalized posture.  PT Short Term Goal 3 - Progress: Progressing toward goal PT Long Term Goals Time to Complete Long Term Goals:  (6 weeks) PT Long Term Goal 1: Pt will report pain less than 3/10 for 75% of her day for improved QOL. PT Long Term Goal 1 - Progress: Not met PT Long Term Goal 2: Pt will improve her LE strength and core strength to WNL in order to tolerate walking for greater than 30 minutes to complete community activities.  PT Long Term Goal 2 - Progress: Not met Long Term Goal 3: Pt will improve her ODI to less than 25% for improved percieved functional ability.  Long Term Goal 3 Progress: Not met Long Term Goal 4: Pt will decreae pain and tenderness to low back and hip area for imrpoved QOL.  Long Term Goal 4 Progress: Progressing toward goal  Problem List Patient Active Problem List   Diagnosis Date Noted  . Hip pain 04/09/2013  . Hemorrhoids, internal 03/12/2013  . Back pain 03/11/2013  . Encounter for therapeutic drug monitoring 03/11/2013  . Trochanteric bursitis 03/11/2013  . Lumbar facet arthropathy 03/11/2013  . Unstable angina 06/02/2012  . Acute on chronic renal insufficiency 06/02/2012  . Chest pain 04/22/2012  . IBS (irritable bowel syndrome)-DIARRHEA 01/23/2012  . Morbid obesity 08/09/2011  . Diabetes mellitus 08/09/2011  . DIARRHEA 10/11/2010  . Chronic diastolic heart failure 123456  . PULMONARY HYPERTENSION, SECONDARY 12/12/2007  . OSTEOARTHRITIS 12/11/2007  . SLEEP APNEA 12/11/2007    PT - End of Session Activity Tolerance: Patient tolerated treatment well General Behavior During Therapy: WFL for tasks assessed/performed Cognition: WFL for tasks performed  GP    Eulamae Greenstein, MPT, ATC 05/14/2013, 4:33 PM  Physician Documentation Your signature is required to indicate approval of the treatment plan as  stated above.  Please sign and either send electronically or make a copy of this report for your files and return this physician signed original.   Please mark one 1.__approve of plan  2. ___approve of plan with the following conditions.   ______________________________                                                          _____________________ Physician Signature                                                                                                             Date

## 2013-05-19 ENCOUNTER — Ambulatory Visit (HOSPITAL_COMMUNITY): Payer: Medicare HMO | Admitting: Physical Therapy

## 2013-05-20 ENCOUNTER — Encounter: Payer: Self-pay | Admitting: Gastroenterology

## 2013-05-20 ENCOUNTER — Ambulatory Visit (INDEPENDENT_AMBULATORY_CARE_PROVIDER_SITE_OTHER): Payer: Medicare HMO | Admitting: Gastroenterology

## 2013-05-20 VITALS — BP 130/76 | HR 79 | Wt 287.3 lb

## 2013-05-20 DIAGNOSIS — K648 Other hemorrhoids: Secondary | ICD-10-CM

## 2013-05-20 NOTE — Patient Instructions (Addendum)
HEMORRHOID BANDING PROCEDURE    FOLLOW-UP CARE   1. The procedure you have had should have been relatively painless since the banding of the area involved does not have nerve endings and there is no pain sensation.  The rubber band cuts off the blood supply to the hemorrhoid and the band may fall off as soon as 48 hours after the banding (the band may occasionally be seen in the toilet bowl following a bowel movement). You may notice a temporary feeling of fullness in the rectum which should respond adequately to plain Tylenol or Motrin.  2. Following the banding, avoid strenuous exercise that evening and resume full activity the next day.  A sitz bath (soaking in a warm tub) or bidet is soothing, and can be useful for cleansing the area after bowel movements.     3. To avoid constipation, take two tablespoons of natural wheat bran, natural oat bran, flax, Benefiber or any over the counter fiber supplement and increase your water intake to 7-8 glasses daily.    4. Unless you have been prescribed anorectal medication, do not put anything inside your rectum for two weeks: No suppositories, enemas, fingers, etc.  5. Occasionally, you may have more bleeding than usual after the banding procedure.  This is often from the untreated hemorrhoids rather than the treated one.  Don't be concerned if there is a tablespoon or so of blood.  If there is more blood than this, lie flat with your bottom higher than your head and apply an ice pack to the area. If the bleeding does not stop within a half an hour or if you feel faint, call our office at [phone]  or go to the emergency room.  6. Problems are not common; however, if there is a substantial amount of bleeding, severe pain, chills, fever or difficulty passing urine (very rare) or other problems, you should call us at [phone] or report to the nearest emergency room.  7. Do not stay seated continuously for more than 2-3 hours for a day or two  after the procedure.  Tighten your buttock muscles 10-15 times every two hours and take 10-15 deep breaths every 1-2 hours.  Do not spend more than a few minutes on the toilet if you cannot empty your bowel; instead re-visit the toilet at a later time.

## 2013-05-26 ENCOUNTER — Ambulatory Visit (HOSPITAL_COMMUNITY)
Admission: RE | Admit: 2013-05-26 | Discharge: 2013-05-26 | Disposition: A | Discharge status: Home / Self Care | Payer: Medicare HMO | Source: Ambulatory Visit | Attending: Physical Medicine and Rehabilitation | Admitting: Physical Medicine and Rehabilitation

## 2013-05-26 NOTE — Progress Notes (Signed)
Physical Therapy Treatment/[employer] re-eval submitted Patient Details  Name: Sara Tran MRN: [id number] Date of Birth: 1956/02/17  Today's Date: 05/26/2013 Time: [id number] PT Time Calculation (min): 35 min Charges: 25' manual, 10' TE Visit#: 10 of 18  Re-eval: 06/11/13    Authorization: [employer]  Authorization Time Period: [employer] Submitted Covered - Resubmitted on 5/27 for 8 additional visits (coverage ended on 5/26)  Authorization Visit#: 10 of 20   Subjective: Symptoms/Limitations Symptoms: Pt reports that she had a hemorroid banding surgery completed last week and she still has the bands on and is causing some increased discomfort to her hip.  She states she was placed on restricted activity due to the surgery.  She has attempted to re-correct alignment, but unable to due to surgery.  Overall feels she is improving.  Pain Assessment Pain Score:   9 Pain Location: Back  Precautions/Restrictions     Exercise/Treatments Stretches Hip Flexor Stretch: 3 reps;30 seconds Supine Clam: 20 reps;Limitations Clam Limitations: w/ab set BLE  Manual Therapy Manual Therapy: Other (comment) Other Manual Therapy: MET for Rt outflare and sacral anterior rotation  Physical Therapy Assessment and Plan PT Assessment and Plan Clinical Impression Statement: Re-eval submitted to [facility]  Pt continues to have significant reduction in pain after manual and core activities.  Reduced activity on TM and standing activities due to recent surgery and was placed on restricted activity.  Overall continues to show improved control and stability with core activities.  Likely has continiued referred pain to Rt hip from current hemorid banding surgery. Pt is with slow movements, howere improved body mechanics overall.  PT Plan: Continue with core strengthening.  When able add theraband activites, resume bend knee raises, bridges, SLR (all directions) to improve strength. Progress lumbar flexion AROM.      Goals    Problem List Patient Active Problem List   Diagnosis Date Noted  . Hip pain 04/09/2013  . Hemorrhoids, internal 03/12/2013  . Back pain 03/11/2013  . Encounter for therapeutic drug monitoring 03/11/2013  . Trochanteric bursitis 03/11/2013  . Lumbar facet arthropathy 03/11/2013  . Unstable angina 06/02/2012  . Acute on chronic renal insufficiency 06/02/2012  . Chest pain 04/22/2012  . IBS (irritable bowel syndrome)-DIARRHEA 01/23/2012  . Morbid obesity 08/09/2011  . Diabetes mellitus 08/09/2011  . DIARRHEA 10/11/2010  . Chronic diastolic heart failure 123456  . PULMONARY HYPERTENSION, SECONDARY 12/12/2007  . OSTEOARTHRITIS 12/11/2007  . SLEEP APNEA 12/11/2007    PT - End of Session Activity Tolerance: Patient tolerated treatment well General Behavior During Therapy: WFL for tasks assessed/performed Cognition: WFL for tasks performed  GP    Leonardo Makris 05/26/2013, 1:47 PM

## 2013-05-29 ENCOUNTER — Ambulatory Visit (HOSPITAL_COMMUNITY)
Admission: RE | Admit: 2013-05-29 | Discharge: 2013-05-29 | Disposition: A | Discharge status: Home / Self Care | Payer: Medicare HMO | Source: Ambulatory Visit | Attending: Physical Medicine and Rehabilitation | Admitting: Physical Medicine and Rehabilitation

## 2013-05-29 ENCOUNTER — Emergency Department (HOSPITAL_COMMUNITY)
Admission: EM | Admit: 2013-05-29 | Discharge: 2013-05-29 | Disposition: A | Discharge status: Home / Self Care | Payer: Medicare HMO | Attending: Emergency Medicine | Admitting: Emergency Medicine

## 2013-05-29 ENCOUNTER — Emergency Department (HOSPITAL_COMMUNITY): Payer: Medicare HMO

## 2013-05-29 ENCOUNTER — Encounter (HOSPITAL_COMMUNITY): Payer: Self-pay | Admitting: *Deleted

## 2013-05-29 DIAGNOSIS — G8929 Other chronic pain: Secondary | ICD-10-CM

## 2013-05-29 DIAGNOSIS — Z9884 Bariatric surgery status: Secondary | ICD-10-CM | POA: Insufficient documentation

## 2013-05-29 DIAGNOSIS — Z8739 Personal history of other diseases of the musculoskeletal system and connective tissue: Secondary | ICD-10-CM | POA: Insufficient documentation

## 2013-05-29 DIAGNOSIS — Z862 Personal history of diseases of the blood and blood-forming organs and certain disorders involving the immune mechanism: Secondary | ICD-10-CM | POA: Insufficient documentation

## 2013-05-29 DIAGNOSIS — I2789 Other specified pulmonary heart diseases: Secondary | ICD-10-CM | POA: Insufficient documentation

## 2013-05-29 DIAGNOSIS — M25559 Pain in unspecified hip: Secondary | ICD-10-CM | POA: Insufficient documentation

## 2013-05-29 DIAGNOSIS — Z91040 Latex allergy status: Secondary | ICD-10-CM | POA: Insufficient documentation

## 2013-05-29 DIAGNOSIS — I509 Heart failure, unspecified: Secondary | ICD-10-CM | POA: Insufficient documentation

## 2013-05-29 DIAGNOSIS — Z79899 Other long term (current) drug therapy: Secondary | ICD-10-CM | POA: Insufficient documentation

## 2013-05-29 DIAGNOSIS — M129 Arthropathy, unspecified: Secondary | ICD-10-CM | POA: Insufficient documentation

## 2013-05-29 DIAGNOSIS — E119 Type 2 diabetes mellitus without complications: Secondary | ICD-10-CM | POA: Insufficient documentation

## 2013-05-29 DIAGNOSIS — N898 Other specified noninflammatory disorders of vagina: Secondary | ICD-10-CM | POA: Insufficient documentation

## 2013-05-29 DIAGNOSIS — N949 Unspecified condition associated with female genital organs and menstrual cycle: Secondary | ICD-10-CM | POA: Insufficient documentation

## 2013-05-29 DIAGNOSIS — Z8669 Personal history of other diseases of the nervous system and sense organs: Secondary | ICD-10-CM | POA: Insufficient documentation

## 2013-05-29 DIAGNOSIS — Z87891 Personal history of nicotine dependence: Secondary | ICD-10-CM | POA: Insufficient documentation

## 2013-05-29 DIAGNOSIS — M545 Low back pain, unspecified: Secondary | ICD-10-CM | POA: Insufficient documentation

## 2013-05-29 DIAGNOSIS — N939 Abnormal uterine and vaginal bleeding, unspecified: Secondary | ICD-10-CM

## 2013-05-29 DIAGNOSIS — Z8742 Personal history of other diseases of the female genital tract: Secondary | ICD-10-CM | POA: Insufficient documentation

## 2013-05-29 DIAGNOSIS — Z8639 Personal history of other endocrine, nutritional and metabolic disease: Secondary | ICD-10-CM | POA: Insufficient documentation

## 2013-05-29 DIAGNOSIS — Z9861 Coronary angioplasty status: Secondary | ICD-10-CM | POA: Insufficient documentation

## 2013-05-29 DIAGNOSIS — J449 Chronic obstructive pulmonary disease, unspecified: Secondary | ICD-10-CM | POA: Insufficient documentation

## 2013-05-29 DIAGNOSIS — J4489 Other specified chronic obstructive pulmonary disease: Secondary | ICD-10-CM | POA: Insufficient documentation

## 2013-05-29 DIAGNOSIS — Z87448 Personal history of other diseases of urinary system: Secondary | ICD-10-CM | POA: Insufficient documentation

## 2013-05-29 DIAGNOSIS — G4730 Sleep apnea, unspecified: Secondary | ICD-10-CM | POA: Insufficient documentation

## 2013-05-29 DIAGNOSIS — R109 Unspecified abdominal pain: Secondary | ICD-10-CM | POA: Insufficient documentation

## 2013-05-29 DIAGNOSIS — Z9089 Acquired absence of other organs: Secondary | ICD-10-CM | POA: Insufficient documentation

## 2013-05-29 DIAGNOSIS — Z8719 Personal history of other diseases of the digestive system: Secondary | ICD-10-CM | POA: Insufficient documentation

## 2013-05-29 HISTORY — DX: Dorsalgia, unspecified: M54.9

## 2013-05-29 HISTORY — DX: Sciatica, right side: M54.31

## 2013-05-29 HISTORY — DX: Leiomyoma of uterus, unspecified: D25.9

## 2013-05-29 HISTORY — DX: Pain in unspecified hip: M25.559

## 2013-05-29 HISTORY — DX: Other chronic pain: G89.29

## 2013-05-29 LAB — POCT I-STAT, CHEM 8
BUN: 16 mg/dL (ref 6–23)
Calcium, Ion: 1.14 mmol/L (ref 1.12–1.23)
Chloride: 107 mEq/L (ref 96–112)
Creatinine, Ser: 1.3 mg/dL — ABNORMAL HIGH (ref 0.50–1.10)
Glucose, Bld: 99 mg/dL (ref 70–99)
HCT: 42 % (ref 36.0–46.0)
Hemoglobin: 14.3 g/dL (ref 12.0–15.0)
Potassium: 4.2 mEq/L (ref 3.5–5.1)
Sodium: 142 mEq/L (ref 135–145)
TCO2: 27 mmol/L (ref 0–100)

## 2013-05-29 LAB — URINALYSIS, ROUTINE W REFLEX MICROSCOPIC
Bilirubin Urine: NEGATIVE
Glucose, UA: NEGATIVE mg/dL
Ketones, ur: NEGATIVE mg/dL
Leukocytes, UA: NEGATIVE
Nitrite: NEGATIVE
Protein, ur: NEGATIVE mg/dL
Specific Gravity, Urine: 1.025 (ref 1.005–1.030)
Urobilinogen, UA: 0.2 mg/dL (ref 0.0–1.0)
pH: 6 (ref 5.0–8.0)

## 2013-05-29 LAB — WET PREP, GENITAL
Clue Cells Wet Prep HPF POC: NONE SEEN
Trich, Wet Prep: NONE SEEN
Yeast Wet Prep HPF POC: NONE SEEN

## 2013-05-29 LAB — URINE MICROSCOPIC-ADD ON

## 2013-05-29 MED ORDER — METHOCARBAMOL 500 MG PO TABS: 1000.0000 mg | ORAL_TABLET | Freq: Four times a day (QID) | ORAL | Status: DC | PRN

## 2013-05-29 MED ORDER — HYDROMORPHONE HCL PF 1 MG/ML IJ SOLN
1.0000 mg | Freq: Once | INTRAMUSCULAR | Status: AC
  Administered 2013-05-29: 1 mg via INTRAMUSCULAR
  Filled 2013-05-29: qty 1

## 2013-05-29 NOTE — ED Notes (Signed)
Instructions, prescriptions and f/u information given/reviewed - verbalizes understanding.

## 2013-05-29 NOTE — ED Notes (Signed)
C/o lower abd cramping and lower back pain - reports onset of heavy vaginal bleeding with clots last night; reports last period in 2010, but has "cycled" and has had spotting monthly since that time. C/o nausea; denies vomiting. States changing pads q 2 hours. Pt also c/o chronic back pain and right hip pain r/t old injury, and is receiving physical therapy for same.

## 2013-05-29 NOTE — ED Provider Notes (Signed)
History     CSN: [id number]  Arrival date & time 05/29/13  1311   First MD Initiated Contact with Patient 05/29/13 1409      Chief Complaint  Patient presents with  . Vaginal Bleeding  . Back Pain     HPI Pt was seen at 1420.  Per pt, c/o gradual onset and persistence of constant acute flair of her chronic low back "pain" for the past several years, worse over the past several days.  Denies any change in her usual chronic pain pattern.  Pain worsens with palpation of the area and body position changes. States the pain began after she has been "cutting down using my percocet because I'm running out."  Denies incont/retention of bowel or bladder, no saddle anesthesia, no focal motor weakness, no tingling/numbness in extremities, no fevers, no injury, no abd pain.  The patient has a significant history of similar symptoms previously, recently being evaluated for this complaint and multiple prior evals for same.  Pt also c/o gradual onset and persistence of intermittent vaginal bleeding that began 1 week ago. Pt states she began to have vaginal spotting 1 week ago (used several panty liners), and since last night had constant vaginal bleeding "with clots" (used 1 pad).  States the vaginal bleeding has been associated with lower pelvic "cramping" pain as well as caused an acute flair of her chronic low back pain.  Denies vaginal discharge, no fevers, no dysuria/hematuria, no rectal pain/bleeding, no flank pain.    OB/GYN Dr. Glo Herring Past Medical History  Diagnosis Date  . CHF (congestive heart failure)     Normal left ventricular systolic function, ejection fraction 55-60% 07/2007 by cath.  . Diabetes mellitus   . Sleep apnea     CPAP use  . Pulmonary HTN     RHC 2008:  RA pressures 13/10 with a mean of 7 mmHg.  RV pressure 123456 with end diastolic pressure of 15 mmHg.  PA pressure 49/23 with a mean of 35 mmHg.  Pulmonary capillary wedge 17/50 with a mean of 14 mmHg. The PA saturation was 68%.   RA saturation was 71% and aortic saturation was 90%.  Cardiac output was 6.0 with a cardiac index of 2.80 by Fick.  Normal coronaries by cath 2008.  . Morbid obesity     s/p lap band surgery  . Asthma   . Arthritis   . Hypertriglyceridemia   . COPD (chronic obstructive pulmonary disease)   . IBS (irritable bowel syndrome)   . Renal insufficiency   . Chronic back pain   . Chronic hip pain   . Sciatica of right side   . Uterine fibroid     Past Surgical History  Procedure Laterality Date  . Laparoscopic cholecystectomy  1985  . Laparoscopic gastric banding  12/12/2010  . Pilonidal cyst excision  1974  . Tumor excision  10/2011    left thigh  . Tumor excision  10/2011    on face  . Colonoscopy  10/18/10    SLF:2-mm sessile sigmoid polyp/diverticula  . Cardiac catheterization  2008    w/o coronary artery disease    Family History  Problem Relation Age of Onset  . Breast cancer Sister   . Breast cancer Paternal Aunt   . Diabetes Mother   . Heart failure Mother     History  Substance Use Topics  . Smoking status: Former Smoker    Quit date: 08/08/1996  . Smokeless tobacco: Never Used  . Alcohol Use:  No      Review of Systems ROS: Statement: All systems negative except as marked or noted in the HPI; Constitutional: Negative for fever and chills. ; ; Eyes: Negative for eye pain, redness and discharge. ; ; ENMT: Negative for ear pain, hoarseness, nasal congestion, sinus pressure and sore throat. ; ; Cardiovascular: Negative for chest pain, palpitations, diaphoresis, dyspnea and peripheral edema. ; ; Respiratory: Negative for cough, wheezing and stridor. ; ; Gastrointestinal: Negative for nausea, vomiting, diarrhea, abdominal pain, blood in stool, hematemesis, jaundice and rectal bleeding. . ; ; Genitourinary: Negative for dysuria, flank pain and hematuria. ; ; GYN:  +pelvic pain, +vaginal bleeding, no vaginal discharge, no vulvar pain.;; Musculoskeletal: +LBP, right hip pain.  Negative for neck pain. Negative for swelling and trauma.; ; Skin: Negative for pruritus, rash, abrasions, blisters, bruising and skin lesion.; ; Neuro: Negative for headache, lightheadedness and neck stiffness. Negative for weakness, altered level of consciousness , altered mental status, extremity weakness, paresthesias, involuntary movement, seizure and syncope.       Allergies  Aspirin; Ciprofloxacin; and Latex  Home Medications   Current Outpatient Rx  Name  Route  Sig  Dispense  Refill  . albuterol (PROVENTIL HFA;VENTOLIN HFA) 108 (90 BASE) MCG/ACT inhaler   Inhalation   Inhale 2 puffs into the lungs every 6 (six) hours as needed. FOR SHORTNESS OF BREATH/WHEEZING         . amLODipine (NORVASC) 10 MG tablet   Oral   Take 1 tablet by mouth daily.         . beclomethasone (QVAR) 80 MCG/ACT inhaler   Inhalation   Inhale 2 puffs into the lungs 2 (two) times daily as needed. FOR SHORTNESS OF BREATH/WHEEZING         . fenofibrate 160 MG tablet   Oral   Take 160 mg by mouth every morning.          . fluticasone (FLONASE) 50 MCG/ACT nasal spray   Nasal   Place 1-2 sprays into the nose daily.          Marland Kitchen ipratropium-albuterol (DUONEB) 0.5-2.5 (3) MG/3ML SOLN   Nebulization   Take 3 mLs by nebulization every 4 (four) hours as needed. Shortness of Breath         . isosorbide mononitrate (IMDUR) 30 MG 24 hr tablet   Oral   Take 30 mg by mouth every morning.          . loratadine (CLARITIN) 10 MG tablet   Oral   Take 10 mg by mouth daily.          Marland Kitchen losartan (COZAAR) 100 MG tablet   Oral   Take 100 mg by mouth every morning.          . metFORMIN (GLUCOPHAGE) 500 MG tablet   Oral   Take 1 tablet by mouth 2 (two) times daily.         . Multiple Vitamin (MULTIVITAMIN WITH MINERALS) TABS   Oral   Take 1 tablet by mouth daily.         . nitroGLYCERIN (NITROSTAT) 0.4 MG SL tablet   Sublingual   Place 0.4 mg under the tongue every 5 (five) minutes as  needed. Chest Pains         . oxyCODONE-acetaminophen (PERCOCET) 10-325 MG per tablet   Oral   Take 1 tablet by mouth every 6 (six) hours as needed for pain.          . potassium chloride  SA (K-DUR,KLOR-CON) 20 MEQ tablet   Oral   Take 20 mEq by mouth daily.          Marland Kitchen torsemide (DEMADEX) 20 MG tablet   Oral   Take 20 mg by mouth daily as needed. Swelling         . zolpidem (AMBIEN) 10 MG tablet   Oral   Take 1 tablet by mouth at bedtime as needed for sleep.            BP 172/83  Pulse 83  Temp(Src) 98.2 F (36.8 C) (Oral)  Resp 20  Ht 4\' 9"  (1.448 m)  Wt 287 lb (130.182 kg)  BMI 62.09 kg/m2  SpO2 99%  Physical Exam 1425: Physical examination:  Nursing notes reviewed; Vital signs and O2 SAT reviewed;  Constitutional: Well developed, Well nourished, Well hydrated, In no acute distress; Head:  Normocephalic, atraumatic; Eyes: EOMI, PERRL, No scleral icterus; ENMT: Mouth and pharynx normal, Mucous membranes moist; Neck: Supple, Full range of motion, No lymphadenopathy; Cardiovascular: Regular rate and rhythm, No murmur, rub, or gallop; Respiratory: Breath sounds clear & equal bilaterally, No rales, rhonchi, wheezes.  Speaking full sentences with ease, Normal respiratory effort/excursion; Chest: Nontender, Movement normal; Abdomen: Soft, Nontender, Nondistended, Normal bowel sounds; Genitourinary: No CVA tenderness. Pelvic exam performed with permission of pt and female ED tech assist during exam.  External genitalia w/o lesions. Vaginal vault without discharge, +small amount of dark blood with clots in vault.  Cervix w/o lesions, not friable, GC/chlam and wet prep obtained and sent to lab.  Bimanual exam w/o CMT or adnexal tenderness. +mild suprapubic tenderness to palp.;; Spine:  No midline CS, TS, LS tenderness. +TTP right lumbar paraspinal muscles and right hip area.;; Extremities: Pulses normal, No tenderness, No edema, No calf edema or asymmetry.; Neuro: AA&Ox3, Major CN  grossly intact.  Speech clear. Strength 5/5 equal bilat UE's and LE's, including great toe dorsiflexion.  DTR 2/4 equal bilat UE's and LE's.  No gross sensory deficits.  Neg straight leg raises bilat.; Skin: Color normal, Warm, Dry.   ED Course  Procedures    MDM  MDM Reviewed: previous chart, nursing note and vitals Reviewed previous: labs Interpretation: labs and ultrasound   Results for orders placed during the hospital encounter of 05/29/13  WET PREP, GENITAL      Result Value Range   Yeast Wet Prep HPF POC NONE SEEN  NONE SEEN   Trich, Wet Prep NONE SEEN  NONE SEEN   Clue Cells Wet Prep HPF POC NONE SEEN  NONE SEEN   WBC, Wet Prep HPF POC FEW (*) NONE SEEN  URINALYSIS, ROUTINE W REFLEX MICROSCOPIC      Result Value Range   Color, Urine YELLOW  YELLOW   APPearance CLEAR  CLEAR   Specific Gravity, Urine 1.025  1.005 - 1.030   pH 6.0  5.0 - 8.0   Glucose, UA NEGATIVE  NEGATIVE mg/dL   Hgb urine dipstick LARGE (*) NEGATIVE   Bilirubin Urine NEGATIVE  NEGATIVE   Ketones, ur NEGATIVE  NEGATIVE mg/dL   Protein, ur NEGATIVE  NEGATIVE mg/dL   Urobilinogen, UA 0.2  0.0 - 1.0 mg/dL   Nitrite NEGATIVE  NEGATIVE   Leukocytes, UA NEGATIVE  NEGATIVE  URINE MICROSCOPIC-ADD ON      Result Value Range   Squamous Epithelial / LPF FEW (*) RARE   WBC, UA 0-2  <3 WBC/hpf   RBC / HPF 21-50  <3 RBC/hpf  POCT I-STAT, CHEM 8  Result Value Range   Sodium 142  135 - 145 mEq/L   Potassium 4.2  3.5 - 5.1 mEq/L   Chloride 107  96 - 112 mEq/L   BUN 16  6 - 23 mg/dL   Creatinine, Ser 1.30 (*) 0.50 - 1.10 mg/dL   Glucose, Bld 99  70 - 99 mg/dL   Calcium, Ion 1.14  1.12 - 1.23 mmol/L   TCO2 27  0 - 100 mmol/L   Hemoglobin 14.3  12.0 - 15.0 g/dL   HCT 42.0  36.0 - 46.0 %   US Transvaginal Non-ob 05/29/2013   *RADIOLOGY REPORT*  Clinical Data: Postmenopausal bleeding.  History of fibroids  TRANSABDOMINAL AND TRANSVAGINAL ULTRASOUND OF PELVIS Technique:  Both transabdominal and transvaginal  ultrasound examinations of the pelvis were performed. Transabdominal technique was performed for global imaging of the pelvis including uterus, ovaries, adnexal regions, and pelvic cul-de-sac.  It was necessary to proceed with endovaginal exam following the transabdominal exam to visualize the myometrium, endometrium and adnexa.  Comparison:  2010  Findings: Assessment is compromised by patient body habitus transabdominally combined with poor scanning parameters on endovaginal evaluation  Uterus: Uterine length is approximately 9 cm, depth is estimated at approximately 9 cm and width at approximately 5 cm.  Uterine margins are poorly visualized making more accurate measurement difficult. The fibroid is imaged in the anterior midbody measuring 2.2 x 2.7 x 2.4 cm.  The fundal and upper uterine segment portions of the uterus are poorly visualized on endovaginal exam due to poor scanning parameters but more diffuse fibroid involvement is felt likely  Endometrium: Is poorly visualized and cannot be accurately measured on this exam  Right ovary:  Is not seen with confidence either transabdominally or endovaginally  Left ovary: Is not seen with confidence either transabdominally or endovaginally  Other findings: No pelvic fluid is seen  IMPRESSION: Limited evaluation possible of the upper uterine segment and fundal portions of the myometrium as well as the endometrial lining on today's exam due to patient body habitus combined with poor scanning parameters.  Focal measurable fibroid as noted above.  Non-visualized ovaries.   Original Report Authenticated By: Ponciano Ort, M.D.    [phone]:  No acute findings on workup today. Pt has tol PO well while in the ED without N/V.  VS remain stable. Neuro exam remains unchanged.  Feels improved after pain meds and wants to go home now.  Pt is currently participating in rehab for her chronic back pain and has rx pain meds at home. Will add muscle relaxant. Will need to f/u with  OB/GYN regarding vaginal bleeding. Dx and testing d/w pt.  Questions answered.  Verb understanding, agreeable to d/c home with outpt f/u.              Alfonzo Feller, DO 06/01/13 1646

## 2013-05-29 NOTE — ED Notes (Addendum)
Vaginal bleeding with clots, onset this am.  Pt is menopausal.    Low back pain and pain in both hips.  Nausea, no vomiting  Feels sob at times

## 2013-05-29 NOTE — Progress Notes (Signed)
Physical Therapy Treatment Patient Details  Name: Sara Tran MRN: [id number] Date of Birth: July 10, 1956  Today's Date: 05/29/2013 No charge, no therapy session today.  Subjective: Symptoms/Limitations Symptoms: Pt reported began period first in time in years and has high pain scale 12/10 LBP and Rt hip pain today.  Pain rated 12/10.  Pt wishes to go to ER for pain control. Pain Assessment Currently in Pain?: Yes Pain Score: 10-Worst pain ever (12/10) Pain Location: Back  Physical Therapy Assessment and Plan PT Assessment and Plan Clinical Impression Statement: No PT session today, pt entered department with pain scale 12/10 LBP and Rt hip pain and wished to go to ER.  Pt reported beginning menstrual cycle, first time in years; PT Plan: Continue with core strengthening.  When able add theraband activites, resume bend knee raises, bridges, SLR (all directions) to improve strength. Progress lumbar flexion AROM.     Goals    Problem List Patient Active Problem List   Diagnosis Date Noted  . Hip pain 04/09/2013  . Hemorrhoids, internal 03/12/2013  . Back pain 03/11/2013  . Encounter for therapeutic drug monitoring 03/11/2013  . Trochanteric bursitis 03/11/2013  . Lumbar facet arthropathy 03/11/2013  . Unstable angina 06/02/2012  . Acute on chronic renal insufficiency 06/02/2012  . Chest pain 04/22/2012  . IBS (irritable bowel syndrome)-DIARRHEA 01/23/2012  . Morbid obesity 08/09/2011  . Diabetes mellitus 08/09/2011  . DIARRHEA 10/11/2010  . Chronic diastolic heart failure [id number]  . PULMONARY HYPERTENSION, SECONDARY 12/12/2007  . OSTEOARTHRITIS 12/11/2007  . SLEEP APNEA 12/11/2007       GP    [name] 05/29/2013, 1:50 PM

## 2013-05-30 LAB — GC/CHLAMYDIA PROBE AMP
CT Probe RNA: NEGATIVE
GC Probe RNA: NEGATIVE

## 2013-05-31 LAB — URINE CULTURE
Colony Count: NO GROWTH
Culture: NO GROWTH
Special Requests: NORMAL

## 2013-06-02 ENCOUNTER — Ambulatory Visit (HOSPITAL_COMMUNITY)
Admission: RE | Admit: 2013-06-02 | Discharge: 2013-06-02 | Disposition: A | Discharge status: Home / Self Care | Payer: Medicare HMO | Source: Ambulatory Visit | Attending: Physical Medicine and Rehabilitation | Admitting: Physical Medicine and Rehabilitation

## 2013-06-02 DIAGNOSIS — M545 Low back pain, unspecified: Secondary | ICD-10-CM | POA: Insufficient documentation

## 2013-06-02 DIAGNOSIS — M25559 Pain in unspecified hip: Secondary | ICD-10-CM | POA: Insufficient documentation

## 2013-06-02 DIAGNOSIS — E119 Type 2 diabetes mellitus without complications: Secondary | ICD-10-CM | POA: Insufficient documentation

## 2013-06-02 DIAGNOSIS — M6281 Muscle weakness (generalized): Secondary | ICD-10-CM | POA: Insufficient documentation

## 2013-06-02 DIAGNOSIS — IMO0001 Reserved for inherently not codable concepts without codable children: Secondary | ICD-10-CM | POA: Insufficient documentation

## 2013-06-02 NOTE — Progress Notes (Signed)
Physical Therapy Treatment Patient Details  Name: Sara Tran MRN: [id number] Date of Birth: 16-Dec-1956  Today's Date: 06/02/2013 Time: 1300-1338 PT Time Calculation (min): 38 min Charge; Manual x 25 ([id number]), TE x 12' [phone])  Visit#: 11 of 18  Re-eval: 06/11/13    Authorization: [employer]  Authorization Time Period: [employer] Submitted Covered - covered through 07/10/2013  Authorization Visit#: 11 of 20   Subjective: Symptoms/Limitations Symptoms: Pt reported menstrual cycle continues, first time since 2010.  Pt c/o LBP scale 8/10 today.  Pt reported she feels stronger, improved ease with daily hygiene and breathing is easier. Pain Assessment Currently in Pain?: Yes Pain Score:   8 Pain Location: Back Pain Orientation: Lower;Medial  Objective:   Exercise/Treatments Supine Ab Set: 5 reps;Limitations AB Set Limitations: VC and TC for ab set  Clam: 10 reps;Limitations Clam Limitations: w/ab set BLE  Manual Therapy Manual Therapy: Myofascial release Myofascial Release: MFR/Connective tissue release to abdominal region to reduce cramps and reduce fascial restrictions  Physical Therapy Assessment and Plan PT Assessment and Plan Clinical Impression Statement: SI within alignment, no MET required this session.  This session focus on pain control and improving core activation. MFR//connective tissue release complete to abdominal region to reduce cramps with significant pain relief stated by patient.  Improved gait mechanics noted following manual.  Pt with improved activation with core musculature, less tactile cueing required for proper musculature.  Pt reported pain reduced by 3 grades at end of session. PT Plan: Continue with core strengthening.  When able add theraband activites, resume bend knee raises, bridges, SLR (all directions) to improve strength. Progress lumbar flexion AROM.     Goals    Problem List Patient Active Problem List   Diagnosis Date Noted   . Hip pain 04/09/2013  . Hemorrhoids, internal 03/12/2013  . Back pain 03/11/2013  . Encounter for therapeutic drug monitoring 03/11/2013  . Trochanteric bursitis 03/11/2013  . Lumbar facet arthropathy 03/11/2013  . Unstable angina 06/02/2012  . Acute on chronic renal insufficiency 06/02/2012  . Chest pain 04/22/2012  . IBS (irritable bowel syndrome)-DIARRHEA 01/23/2012  . Morbid obesity 08/09/2011  . Diabetes mellitus 08/09/2011  . DIARRHEA 10/11/2010  . Chronic diastolic heart failure [id number]  . PULMONARY HYPERTENSION, SECONDARY 12/12/2007  . OSTEOARTHRITIS 12/11/2007  . SLEEP APNEA 12/11/2007    PT - End of Session Activity Tolerance: Patient tolerated treatment well General Behavior During Therapy: WFL for tasks assessed/performed Cognition: WFL for tasks performed  GP    Sara Tran 06/02/2013, 1:45 PM

## 2013-06-03 NOTE — Progress Notes (Signed)
SYMPTOMS:  RECTAL BLEEDING SOMETIMES, PRESSURE, PAIN, ITCHING, BURNING, SOILING  CONSTIPATION: YES DIARRHEA: SOMETIMES STRAINS WITH BMs: SOMETIMES TIME SPENT ON TOILET: 15 MINS TISSUE POKES OUT OF RECTUM: NO FIBER SUPPLEMENTS: NO  GLASSES OF WATER/DAY: 6-8   ADDITIONAL QUESTIONS:  LATEX ALLERGY: YES PREGNANT: NO ERECTILE DYSFUNCTION MEDS OR NITRATES: NO ANTICOAGULATION/ANTIPLATELET MEDS: NO DIAGNOSED WITH CROHN'S DISEASE, PROCTITIS, PORTAL HTN, OR ANAL/RECTAL CA: NO TAKING IMMUNOSUPPRESSANTS/XRT: NO

## 2013-06-03 NOTE — Progress Notes (Signed)
BUT DOING OK. NO PROBLEMS WITH BOWELS. HAVING PROBLEMS WITH DUB. MINIMAL RECTAL PAIN/PRESSURE. HAD A FISHY TYPE ODOR/MUCOUSY DRAINAGE.

## 2013-06-04 ENCOUNTER — Ambulatory Visit (HOSPITAL_COMMUNITY)
Admission: RE | Admit: 2013-06-04 | Discharge: 2013-06-04 | Disposition: A | Discharge status: Home / Self Care | Payer: Medicare HMO | Source: Ambulatory Visit | Attending: Physical Medicine and Rehabilitation | Admitting: Physical Medicine and Rehabilitation

## 2013-06-04 NOTE — Progress Notes (Signed)
Physical Therapy Treatment Patient Details  Name: Sara Tran MRN: [id number] Date of Birth: 1956-09-17  Today's Date: 06/04/2013 Time: [id number] PT Time Calculation (min): 53 min Charge: TE [id number]), Manual 8' [phone])  Visit#: 12 of 18  Re-eval: 06/11/13    Authorization:    Authorization Time Period: [employer] Submitted Covered - covered through 07/10/2013  Authorization Visit#: 12 of 20   Subjective: Symptoms/Limitations Symptoms: Pt reported menstrual cycle continues.  Pt stated LBP scale 8/10 today.  Pt reported increased mobility around the house. Pain Assessment Currently in Pain?: Yes Pain Score:   8 Pain Location: Back Pain Orientation: Lower  Objective:   Exercise/Treatments Aerobic Tread Mill: Gait training incline 2 x 6 minutes for activity tolerance and gait training Standing Heel Raises: 10 reps;Limitations Heel Raises Limitations: Toe raises 10 x Functional Squats: 10 reps;Limitations Functional Squats Limitations: vc for form Scapular Retraction: 10 reps;Theraband Theraband Level (Scapular Retraction): Level 3 (Green) Row: 10 reps;Theraband Theraband Level (Row): Level 3 (Green) Shoulder Extension: 10 reps;Theraband Theraband Level (Shoulder Extension): Level 3 (Green) Supine Bent Knee Raise: 5 reps;5 seconds Bridge: 10 reps Straight Leg Raise: 5 reps Sidelying Clam: 5 reps;5 seconds Hip Abduction: 5 reps Prone  Straight Leg Raise: 5 reps  Manual Therapy Manual Therapy: Myofascial release Myofascial Release: MFR/Connective tissue release to abdominal region to reduce cramps and reduce fascial restrictions  Physical Therapy Assessment and Plan PT Assessment and Plan Clinical Impression Statement: SI within alignment, no MET required this session.  Pt improving gait mechanics, min cueing required for posture.  Added postural strengthening exercises with cueing for form and technique.  Progressed core strengthening exercises with mod cueing  required for stabilty.  Manual connective tissue release and MFR comlete to abdominal region for fascial restrictions to reduce pain.   PT Plan: Continue with core and LE strengthening. Progress lumbar flexion AROM.     Goals    Problem List Patient Active Problem List   Diagnosis Date Noted  . Hip pain 04/09/2013  . Hemorrhoids, internal 03/12/2013  . Back pain 03/11/2013  . Encounter for therapeutic drug monitoring 03/11/2013  . Trochanteric bursitis 03/11/2013  . Lumbar facet arthropathy 03/11/2013  . Unstable angina 06/02/2012  . Acute on chronic renal insufficiency 06/02/2012  . Chest pain 04/22/2012  . IBS (irritable bowel syndrome)-DIARRHEA 01/23/2012  . Morbid obesity 08/09/2011  . Diabetes mellitus 08/09/2011  . DIARRHEA 10/11/2010  . Chronic diastolic heart failure [id number]  . PULMONARY HYPERTENSION, SECONDARY 12/12/2007  . OSTEOARTHRITIS 12/11/2007  . SLEEP APNEA 12/11/2007    PT - End of Session Activity Tolerance: Patient tolerated treatment well General Behavior During Therapy: WFL for tasks assessed/performed Cognition: WFL for tasks performed  GP    Sara Tran 06/04/2013, 2:41 PM

## 2013-06-09 ENCOUNTER — Ambulatory Visit (HOSPITAL_COMMUNITY)
Admission: RE | Admit: 2013-06-09 | Discharge: 2013-06-09 | Disposition: A | Discharge status: Home / Self Care | Payer: Medicare HMO | Source: Ambulatory Visit | Attending: Physical Medicine and Rehabilitation | Admitting: Physical Medicine and Rehabilitation

## 2013-06-09 NOTE — Progress Notes (Signed)
Physical Therapy Treatment Patient Details  Name: Sara Tran MRN: [id number] Date of Birth: February 03, 1956  Today's Date: 06/09/2013 Time: [id number] PT Time Calculation (min): 31 min Charge Manual (1355-1405),Wanda PTT with TE from 1406-1415 no charge, TE 9' [phone])   Visit#: 13 of 18  Re-eval: 06/11/13    Authorization: [employer]  Authorization Time Period: [employer] Submitted Covered - covered through 07/10/2013  Authorization Visit#: 13 of 20   Subjective: Symptoms/Limitations Symptoms: "Feeling better today, menstrual cycle continues but is really slowing down.", pt c/o Rt knee arthritic pain today  Pain Assessment Currently in Pain?: Yes Pain Score:   7 Pain Location: Back Pain Orientation: Lower  Objective :  Exercise/Treatments Aerobic Tread Mill: Gait training incline 5 @ 1.2 mph x 6 minutes following MET Standing Scapular Retraction: 15 reps;Theraband Theraband Level (Scapular Retraction): Level 3 (Green) Row: 15 reps;Theraband Theraband Level (Row): Level 3 (Green) Shoulder Extension: 15 reps;Theraband Theraband Level (Shoulder Extension): Level 3 (Green) Shoulder ADduction: 15 reps;Theraband Theraband Level (Shoulder Adduction): Level 2 (Red) Other Standing Lumbar Exercises: abduction red tband 15x Bil UE Supine Ab Set: 5 reps;Limitations AB Set Limitations: PFC Bridge: 10 reps  Manual Therapy Other Manual Therapy: MET for Lt anterior rotation f/b pubic clearing and TM   Physical Therapy Assessment and Plan PT Assessment and Plan Clinical Impression Statement: MET for Lt anterior rotation wtih improved hip mobility, improved gait mechanics and pain reduced to 5/10 following manual.  Pt improving musculature activation with PFC requiring less cueing.  Added postural strengthening activities without difficulty following cues for technique..   PT Plan: Continue with core and LE strengthening. Progress lumbar flexion AROM.     Goals    Problem  List Patient Active Problem List   Diagnosis Date Noted  . Hip pain 04/09/2013  . Hemorrhoids, internal 03/12/2013  . Back pain 03/11/2013  . Encounter for therapeutic drug monitoring 03/11/2013  . Trochanteric bursitis 03/11/2013  . Lumbar facet arthropathy 03/11/2013  . Unstable angina 06/02/2012  . Acute on chronic renal insufficiency 06/02/2012  . Chest pain 04/22/2012  . IBS (irritable bowel syndrome)-DIARRHEA 01/23/2012  . Morbid obesity 08/09/2011  . Diabetes mellitus 08/09/2011  . DIARRHEA 10/11/2010  . Chronic diastolic heart failure [id number]  . PULMONARY HYPERTENSION, SECONDARY 12/12/2007  . OSTEOARTHRITIS 12/11/2007  . SLEEP APNEA 12/11/2007    PT - End of Session Activity Tolerance: Patient tolerated treatment well General Behavior During Therapy: WFL for tasks assessed/performed Cognition: WFL for tasks performed  GP    [name] 06/09/2013, 2:41 PM

## 2013-06-10 ENCOUNTER — Telehealth (HOSPITAL_COMMUNITY): Payer: Self-pay

## 2013-06-11 ENCOUNTER — Encounter: Payer: Self-pay | Admitting: Gastroenterology

## 2013-06-11 ENCOUNTER — Ambulatory Visit (INDEPENDENT_AMBULATORY_CARE_PROVIDER_SITE_OTHER): Payer: Medicare HMO | Admitting: Gastroenterology

## 2013-06-11 ENCOUNTER — Ambulatory Visit (HOSPITAL_COMMUNITY): Payer: Medicare HMO | Admitting: Physical Therapy

## 2013-06-11 VITALS — BP 138/73 | HR 85 | Temp 97.4°F | Ht <= 58 in | Wt 283.6 lb

## 2013-06-11 DIAGNOSIS — K602 Anal fissure, unspecified: Secondary | ICD-10-CM

## 2013-06-11 DIAGNOSIS — K648 Other hemorrhoids: Secondary | ICD-10-CM

## 2013-06-11 NOTE — Patient Instructions (Addendum)
FOLLOW A HIGH FIBER DIET. AVOID ITEMS THAT CAUSE BLOATING AND GAS. SEE INFO BELOW.  DRINK WATER TO KEEP HER URINE LIGHT YELLOW.  Use NITROGLYCERIN ointment 0.125% three times a day for 2-3 MOS. YOU CAN GET IT FILLED AT [facility] IN [place] OR [facility] IN [place].  USE FIBER POWDER THREE TIMES A DAY.  SIT FOR LESS THAN 2 MINUTES ON THE COMMODE.  FOLLOW UP ON JUL 2 AT 230 PM.     High-Fiber Diet A high-fiber diet changes your normal diet to include more whole grains, legumes, fruits, and vegetables. Changes in the diet involve replacing refined carbohydrates with unrefined foods. The calorie level of the diet is essentially unchanged. The Dietary Reference Intake (recommended amount) for adult males is 38 grams per day. For adult females, it is 25 grams per day. Pregnant and lactating women should consume 28 grams of fiber per day. Fiber is the intact part of a plant that is not broken down during digestion. Functional fiber is fiber that has been isolated from the plant to provide a beneficial effect in the body. PURPOSE  Increase stool bulk.   Ease and regulate bowel movements.   Lower cholesterol.  INDICATIONS THAT YOU NEED MORE FIBER  Constipation and hemorrhoids.   Uncomplicated diverticulosis (intestine condition) and irritable bowel syndrome.   Weight management.   As a protective measure against hardening of the arteries (atherosclerosis), diabetes, and cancer.   DO NOT USE WITH:  Acute diverticulitis (intestine infection).   Partial small bowel obstructions.   Complicated diverticular disease involving bleeding, rupture (perforation), or abscess (boil, furuncle).   Presence of autonomic neuropathy (nerve damage) or gastroparesis (stomach cannot empty itself).    GUIDELINES FOR INCREASING FIBER IN THE DIET  Start adding fiber to the diet slowly. A gradual increase of about 5 more grams (2 slices of whole-wheat bread, 2 servings of most fruits or  vegetables, or 1 bowl of high-fiber cereal) per day is best. Too rapid an increase in fiber may result in constipation, flatulence, and bloating.   Drink enough water and fluids to keep your urine clear or pale yellow. Water, juice, or caffeine-free drinks are recommended. Not drinking enough fluid may cause constipation.   Eat a variety of high-fiber foods rather than one type of fiber.   Try to increase your intake of fiber through using high-fiber foods rather than fiber pills or supplements that contain small amounts of fiber.   The goal is to change the types of food eaten. Do not supplement your present diet with high-fiber foods, but replace foods in your present diet.    INCLUDE A VARIETY OF FIBER SOURCES  Replace refined and processed grains with whole grains, canned fruits with fresh fruits, and incorporate other fiber sources. White rice, white breads, and most bakery goods contain little or no fiber.   Brown whole-grain rice, buckwheat oats, and many fruits and vegetables are all good sources of fiber. These include: broccoli, Brussels sprouts, cabbage, cauliflower, beets, sweet potatoes, white potatoes (skin on), carrots, tomatoes, eggplant, squash, berries, fresh fruits, and dried fruits.   Cereals appear to be the richest source of fiber. Cereal fiber is found in whole grains and bran. Bran is the fiber-rich outer coat of cereal grain, which is largely removed in refining. In whole-grain cereals, the bran remains. In breakfast cereals, the largest amount of fiber is found in those with "bran" in their names. The fiber content is sometimes indicated on the label.   You  may need to include additional fruits and vegetables each day.   In baking, for 1 cup white flour, you may use the following substitutions:   1 cup whole-wheat flour minus 2 tablespoons.   1/2 cup white flour plus 1/2 cup whole-wheat flour.

## 2013-06-12 ENCOUNTER — Telehealth: Payer: Self-pay

## 2013-06-12 NOTE — Telephone Encounter (Signed)
LMOM to call.

## 2013-06-12 NOTE — Telephone Encounter (Signed)
Pt called and said she had the hemorrhoid banding done yesterday and she can hardly walk today. She is very uncomfortable in her rectum. She said she thought maybe the banding is too tight. Then she also said she was having trouble with her hip anyway. She has been using the nitro glycerin ( she said more than the instructions said ). She said it is not pinching it is just very uncomfortable. She thought she could come in and have Dr. Nona Dell redo today. I told her Dr. Oneida Alar is on vacation. She said she will just try to deal with it since it will slough off in a few days. She said at this time the pain is a 10 but it was worse yesterday. I told her to go to the ED if pain is that bad and she said she could deal with it. I told her I would let Dr. Gala Romney know to see if he has any other recommendations, and she said ok but she does not want to go to the ED.

## 2013-06-12 NOTE — Telephone Encounter (Signed)
Analpram ointment -  dispense one tube-apply to the anorectum every 6 hours as needed for pain. No generic substitution. Obtain this prescription and start medication ASAP today. No refills.

## 2013-06-12 NOTE — Progress Notes (Signed)
SYMPTOMS: RECTAL BLEEDING, PRESSURE, PAIN, ITCHING, BURNING, SOILING.  CONSTIPATION: YES DIARRHEA: YES  STRAINS WITH BMs: YES  TIME SPENT ON TOILET: 2-5 MINS TISSUE POKES OUT OF RECTUM: NOT SURE FIBER SUPPLEMENTS: YES-BENEFIBER GLASSES OF WATER/DAY: 6-8-YES   ADDITIONAL QUESTIONS:  LATEX ALLERGY: YES PREGNANT: NO ERECTILE DYSFUNCTION MEDS OR NITRATES: NO ANTICOAGULATION/ANTIPLATELET MEDS: NO DIAGNOSED WITH CROHN'S DISEASE, PROCTITIS, PORTAL HTN, OR ANAL/RECTAL CA: NO TAKING IMMUNOSUPPRESSANTS/XRT: NO  RECTAL EXAM: SPASM OF SPHINCTER-TTP IN ANT AND POSTERIOR MIDLINE   PLAN: 1.[facility] BANDING-LIKELY WILL NEED 4 BANDS

## 2013-06-12 NOTE — Telephone Encounter (Signed)
Dr. Oneida Alar came in the office and said for pt to come to office before 12:00 noon and she will adjust the banding. I called and left message on machine.

## 2013-06-12 NOTE — Telephone Encounter (Signed)
REVIEWED.  

## 2013-06-12 NOTE — Telephone Encounter (Signed)
Routing to Neil Crouch, PA also.

## 2013-06-17 ENCOUNTER — Ambulatory Visit (HOSPITAL_COMMUNITY)
Admission: RE | Admit: 2013-06-17 | Discharge: 2013-06-17 | Disposition: A | Discharge status: Home / Self Care | Payer: Medicare HMO | Source: Ambulatory Visit | Attending: Physical Medicine and Rehabilitation | Admitting: Physical Medicine and Rehabilitation

## 2013-06-17 NOTE — Progress Notes (Signed)
Physical Therapy Re-evaluation/Treatment/Discharge  Patient Details  Name: Sara Tran MRN: [id number] Date of Birth: November 02, 1956  Today's Date: 06/17/2013 Time: [id number] PT Time Calculation (min): [id number] min Charge: MMT x 1 [phone]) , TE 14' [phone]), Manual 10' [phone]), Self care 8' ([phone])              Visit#: 14 of 18  Re-eval: 06/11/13   Authorization: [employer]    Authorization Time Period: [employer] Submitted Covered - covered through 07/10/2013  Authorization Visit#: 14 of 20   Subjective Symptoms/Limitations Symptoms: Pt reported going to the [employer] and walked on treadmill for 12 minutes  How long can you sit comfortably?: 2 hours able to tolerate sitting though church How long can you stand comfortably?: 20 minutes How long can you walk comfortably?: 15-20 minutes pushing cart at grocery store (was 10-15 minutes with more tolerable pain. Pain Assessment Currently in Pain?: Yes Pain Score:   5 Pain Location: Back  Objective :  Exercise/Treatments Standing Scapular Retraction: 15 reps;Theraband Theraband Level (Scapular Retraction): Level 3 (Green);Other (comment) (HEP) Row: 15 reps;Theraband;Other (comment) (HEP) Theraband Level (Row): Level 3 (Green) Shoulder Extension: 15 reps;Theraband;Other (comment) (HEP) Theraband Level (Shoulder Extension): Level 3 (Green)  Manual Therapy Other Manual Therapy: MET check, pubic clearing  Physical Therapy Assessment and Plan PT Assessment and Plan Clinical Impression Statement: Re-eval complete.  Sara Tran has has 72  OPPT sessions over 10 weels with the following findings:  Pt has met 2/3 STG and 1/4 LTGs.  Pt is independent wtih HEP and able to demonstrate appropraite technique with all exercises.  Pt stated pain has reduced, stated average pain range from 5-7/10 50% of her day.  Pt with improved LE strength to WNL, improved activity tolerance and QOL with ability to ambulate for 20 minutes and sit through  church comfortably.  Improved core activatino and coordination with improve posture and able to cue herself when in bad posture position.  Increased ODI score by 10% noted improved perceived functioanl ability. PT Plan: D/C to HEP    Goals Home Exercise Program Pt will Perform Home Exercise Program: Independently: Met (daily) PT Short Term Goals: 3 weeks PT Short Term Goal 1: Pt will report pain less than 4/10 for 50% of her day. : Progressing toward goal (average 7/10) PT Short Term Goal 2: Pt will improve her hip strength by 1 muscle grade in order to tolerate ambulating for greater than 10 minutes. : Met PT Short Term Goal 3: Pt will improve core coordination in order to present with normalized posture. : Met PT Long Term Goals:  6 weeks PT Long Term Goal 1: Pt will report pain less than 3/10 for 75% of her day for improved QOL.: Not met PT Long Term Goal 2: Pt will improve her LE strength and core strength to WNL in order to tolerate walking for greater than 30 minutes to complete community activities.: Partly met (able to tolerate 2hr church activities, ambulate for 20 minu) Long Term Goal 3: Pt will improve her ODI to less than 25% for improved percieved functional ability. : Not met Long Term Goal 4: Pt will decreae pain and tenderness to low back and hip area for imrpoved QOL. : Met  Problem List Patient Active Problem List   Diagnosis Date Noted  . Hip pain 04/09/2013  . Hemorrhoids, internal 03/12/2013  . Back pain 03/11/2013  . Encounter for therapeutic drug monitoring 03/11/2013  . Trochanteric bursitis 03/11/2013  . Lumbar facet arthropathy 03/11/2013  .  Unstable angina 06/02/2012  . Acute on chronic renal insufficiency 06/02/2012  . Chest pain 04/22/2012  . IBS (irritable bowel syndrome)-DIARRHEA 01/23/2012  . Morbid obesity 08/09/2011  . Diabetes mellitus 08/09/2011  . DIARRHEA 10/11/2010  . Chronic diastolic heart failure [id number]  . PULMONARY HYPERTENSION,  SECONDARY 12/12/2007  . OSTEOARTHRITIS 12/11/2007  . SLEEP APNEA 12/11/2007    PT - End of Session Activity Tolerance: Patient tolerated treatment well General Behavior During Therapy: WFL for tasks assessed/performed Cognition: WFL for tasks performed PT Plan of Care PT Home Exercise Plan: Green theraband and worksheet for HEP PT Patient Instructions: Discussed importance of good posture  GP Functional Assessment Tool Used: clinical observation, ODI Functional Limitation: Mobility: Walking and moving around Mobility: Walking and Moving Around Goal Status [phone]): At least 40 percent but less than 60 percent impaired, limited or restricted Mobility: Walking and Moving Around Discharge Status [phone]): At least 60 percent but less than 80 percent impaired, limited or restricted  Sara Tran; Geoffery Lyons, MPT, ATC 06/17/2013, 6:33 PM

## 2013-06-23 ENCOUNTER — Ambulatory Visit (HOSPITAL_COMMUNITY)
Admission: RE | Admit: 2013-06-23 | Discharge: 2013-06-23 | Disposition: A | Discharge status: Home / Self Care | Payer: Medicare HMO | Source: Ambulatory Visit | Attending: Family Medicine | Admitting: Family Medicine

## 2013-06-23 ENCOUNTER — Ambulatory Visit (HOSPITAL_COMMUNITY): Payer: Medicare HMO

## 2013-06-23 ENCOUNTER — Encounter: Payer: Medicare HMO | Admitting: Physical Medicine & Rehabilitation

## 2013-06-23 DIAGNOSIS — Z1231 Encounter for screening mammogram for malignant neoplasm of breast: Secondary | ICD-10-CM | POA: Insufficient documentation

## 2013-07-01 ENCOUNTER — Encounter: Payer: Self-pay | Admitting: Gastroenterology

## 2013-07-01 ENCOUNTER — Ambulatory Visit (INDEPENDENT_AMBULATORY_CARE_PROVIDER_SITE_OTHER): Payer: Medicare HMO | Admitting: Gastroenterology

## 2013-07-01 VITALS — BP 122/70 | Temp 98.5°F | Ht <= 58 in | Wt 286.0 lb

## 2013-07-01 DIAGNOSIS — K589 Irritable bowel syndrome without diarrhea: Secondary | ICD-10-CM

## 2013-07-01 DIAGNOSIS — K648 Other hemorrhoids: Secondary | ICD-10-CM

## 2013-07-01 MED ORDER — DICYCLOMINE HCL 10 MG PO CAPS: ORAL_CAPSULE | ORAL | Status: DC

## 2013-07-01 NOTE — Patient Instructions (Addendum)
HEMORRHOIDAL BANDING DISCHARGE INSTRUCTIONS  I PUT 1 BAND ABOVE YOUR HEMORRHOIDS.  TAKE DICYCLOMINE 10 MG TABLETS ONE OR TWO 30 MINUTES PRIOR MEALS UP TO THREE TIMES A DAY. IT MAY CAUSE DROWSINESS, DRY EYES/MOUTH, BLURRY VISION, OR DIFFICULTY URINATING.   HOME CARE INSTRUCTIONS  FOLLOWING YOUR PROCEDURE IT IS COMMON TO HAVE MINOR RECTAL DISCOMFORT OR PAIN. 1.  May use ibuprofen 200 MG over-the-counter 2 every 6 hours as needed for mild rectal pain.   Take the ibuprofen with food and milk.  2   Use sitz bath 3 times a day AS NEEDED FOR RECTAL PAIN OR YOU CAN USE THE LIDOCAINE GEL EVERY 6 HOURS AS NEEDED FOR RECTAL PAIN. 3. Avoid straining to have bowel movements. 4. Keep anal area dry and clean.  5. Do not use a donut shaped pillow or sit on the toilet for long periods. This increases blood pooling and pain.  6. Move your bowels when your body has the urge; this will require less straining and will decrease pain and pressure.  7. Add Colace 100 mg twice daily FOR 7 DAYS to soften the stool. HOLD FOR DIARRHEA. 8. FOLLOW A LOW RESIDUE DIET UNTIL YOU SEE ME ON APR 30. SEE INFO BELOW.

## 2013-07-01 NOTE — Progress Notes (Signed)
SYMPTOMS:  RECTAL BLEEDING, PRESSURE, PAIN,  BURNING, SOILING. NEEDS LEVSIN PRN. CAN'T AFFORD BECAUSE INSURANCE DOESN'T COVER. REQUESTING ALTERNATIVE.  CONSTIPATION: YES DIARRHEA: yes STRAINS WITH BMs: yes TIME SPENT ON TOILET: 5-7 MINS TISSUE POKES OUT OF RECTUM: NO FIBER SUPPLEMENTS: yes, BENEFIBER  GLASSES OF WATER/DAY: 6-8-NO   ADDITIONAL QUESTIONS:  LATEX ALLERGY: YES PREGNANT: NO ERECTILE DYSFUNCTION MEDS OR NITRATES: NO ANTICOAGULATION/ANTIPLATELET MEDS: NO DIAGNOSED WITH CROHN'S DISEASE, PROCTITIS, PORTAL HTN, OR ANAL/RECTAL CA: NO TAKING IMMUNOSUPPRESSANTS/XRT: NO  Plan: [facility] banding today-LEFT LATERAL           CHANGE TO [facility]  PROCEDURE TECHNIQUE: BENEFITS RISK EXPLAINED TO PT. ONE [facility] BAND PLACED IN LEFT LATERAL POSITION. POST-BANDING RECTAL EXAM REVEALED GOOD PLACEMENT. EXAM NON-TENDER

## 2013-07-29 ENCOUNTER — Encounter: Payer: Self-pay | Admitting: Gastroenterology

## 2013-07-29 ENCOUNTER — Ambulatory Visit (INDEPENDENT_AMBULATORY_CARE_PROVIDER_SITE_OTHER): Payer: Medicare HMO | Admitting: Gastroenterology

## 2013-07-29 VITALS — BP 146/78 | HR 78 | Temp 97.2°F | Ht 64.0 in | Wt 288.0 lb

## 2013-07-29 DIAGNOSIS — K648 Other hemorrhoids: Secondary | ICD-10-CM

## 2013-07-29 DIAGNOSIS — K603 Anal fistula, unspecified: Secondary | ICD-10-CM | POA: Insufficient documentation

## 2013-07-29 NOTE — Assessment & Plan Note (Signed)
R ANT BANDING TODAY.  R POS BANDING AUG 13 AT 215 PM.

## 2013-07-29 NOTE — Assessment & Plan Note (Signed)
RECTAL PAIN IMPROVED. RECTAL EXAM NON-TENDER  CONTINUE NTG O.125% FOR ANOTHER MO. USE WITH CAUTION IF USING NTG SL. OPV AUG 13

## 2013-07-29 NOTE — Progress Notes (Signed)
AFTER LEFT LATERAL JUL 2 HAD OOZING AND RECTAL PAIN AND STINKY DISCHARGE. DIDN'T SEE ANY BLOOD. DRAINAGE STOPPED LAST WEEK. DRINKING NONI JUICE AND CHINESE DETOX TEA.   SYMPTOMS:  RECTAL PAIN, BURNING, SOILING  CONSTIPATION: NO DIARRHEA: NO STRAINS WITH BMs: YES TIME SPENT ON TOILET: 3-5 MINS TISSUE POKES OUT OF RECTUM: NO FIBER SUPPLEMENTS: YES GLASSES OF WATER/DAY: 6-8-YES  ADDITIONAL QUESTIONS:  LATEX ALLERGY: YES PREGNANT: NO NITRATES: YES FOR YEARS FOR CHEST PAIN. LAST TIME YESTERDAY. STILL USING CREAM 3X/DAY. ANTICOAGULATION/ANTIPLATELET MEDS: NO DIAGNOSED WITH CROHN'S DISEASE, PROCTITIS, PORTAL HTN, OR ANAL/RECTAL CA: NO TAKING IMMUNOSUPPRESSANTS/XRT: NO   PLAN: 1. ANOSCOPY/?CR[facility] BANDING TODAY.  PROCEDURE; ANOSCOPY/CRH BAND PLACEMENT PROCEDURE TECHNIQUE: BENEFITS RISK EXPLAINED TO PT.  SCOPE PLACED. REDUNDANT TISSUE IN R ANTERIOR > R POSTERIOR LATERAL HEMORRHOID BUNDLE. LEFT LATERAL BUNDLE NL. ONE CRH BAND PLACED IN RIGHT ANTERIOR POSITION. POST-BANDING RECTAL EXAM REVEALED GOOD PLACEMENT. EXAM NON-TENDER.

## 2013-07-29 NOTE — Patient Instructions (Addendum)
CONTINUE NTG 0.125 MG OINTMENT. USE WITH CAUTION IF YOU ARE TAKING SL NTG.  DRINK WATER TO KEEP YOUR URINE LIGHT YELLOW.  FOLLOW A HIGH FIBER DIET.   AVOID STRAINING AND CONSTIPATION.  FOLLOW UP IN AUG 13- 215 PM.

## 2013-08-12 ENCOUNTER — Encounter: Payer: Self-pay | Admitting: Gastroenterology

## 2013-08-12 ENCOUNTER — Ambulatory Visit (INDEPENDENT_AMBULATORY_CARE_PROVIDER_SITE_OTHER): Payer: Medicare HMO | Admitting: Gastroenterology

## 2013-08-12 VITALS — BP 152/82 | HR 87 | Temp 98.2°F | Ht 63.0 in | Wt 285.0 lb

## 2013-08-12 DIAGNOSIS — K648 Other hemorrhoids: Secondary | ICD-10-CM | POA: Insufficient documentation

## 2013-08-12 NOTE — Assessment & Plan Note (Signed)
NTG OINTMENT TID UNTIL AUG 30. OPV IN 3 MOS

## 2013-08-12 NOTE — Assessment & Plan Note (Signed)
SX IMPROVED.  OPV IN 3 MOS.

## 2013-08-12 NOTE — Progress Notes (Signed)
SYMPTOMS:  BLEEDING better/SOMETIMES, PRESSURE: initial worse & now better, PAIN: worse after band and now better, ITCHING: no BURNING: worse after band/now better, SOILING: not really(X1 only).  CONSTIPATION: NO DIARRHEA: SOMETIMES STRAINS WITH BMs: NO TIME SPENT ON TOILET: 3-5 MINS TISSUE POKES OUT OF RECTUM: NO FIBER SUPPLEMENTS: YES  GLASSES OF WATER/DAY: 6-8   ADDITIONAL QUESTIONS:  LATEX ALLERGY: YES PREGNANT: NO ERECTILE DYSFUNCTION MEDS OR NITRATES: took NTG sl x2 last month ANTICOAGULATION/ANTIPLATELET MEDS: NO DIAGNOSED WITH CROHN'S DISEASE, PROCTITIS, PORTAL HTN, OR ANAL/RECTAL CA: NO TAKING IMMUNOSUPPRESSANTS/XRT: NO  PLAN: 1. R POS BAND TODAY  PROCEDURE TECHNIQUE: BENEFITS RISK EXPLAINED TO PT. ONE CRH BAND PLACED IN R POSTERIOR POSITION. POST-BANDING RECTAL EXAM REVEALED GOOD PLACEMENT. EXAM NON-TENDER.

## 2013-08-12 NOTE — Patient Instructions (Signed)
CONTINUE NTG O.125% until AUG 30.   USE WITH CAUTION IF USING NTG SUBLINGUAL.  DRINK WATER TO KEEP YOUR URINE LIGHT YELLOW.  FOLLOW A HIGH FIBER/LOW FAT DIET.   FOLLOW UP IN 3 MOS.

## 2013-08-12 NOTE — Assessment & Plan Note (Signed)
DRINK WATER EAT FIBER OPV IN 3 MOS

## 2013-09-09 ENCOUNTER — Encounter (HOSPITAL_COMMUNITY): Payer: Self-pay | Admitting: *Deleted

## 2013-09-09 ENCOUNTER — Emergency Department (HOSPITAL_COMMUNITY): Payer: Medicare HMO

## 2013-09-09 ENCOUNTER — Emergency Department (HOSPITAL_COMMUNITY)
Admission: EM | Admit: 2013-09-09 | Discharge: 2013-09-09 | Disposition: A | Discharge status: Home / Self Care | Payer: Medicare HMO | Attending: Emergency Medicine | Admitting: Emergency Medicine

## 2013-09-09 DIAGNOSIS — Z8739 Personal history of other diseases of the musculoskeletal system and connective tissue: Secondary | ICD-10-CM | POA: Insufficient documentation

## 2013-09-09 DIAGNOSIS — Z91040 Latex allergy status: Secondary | ICD-10-CM | POA: Insufficient documentation

## 2013-09-09 DIAGNOSIS — G8929 Other chronic pain: Secondary | ICD-10-CM | POA: Insufficient documentation

## 2013-09-09 DIAGNOSIS — E119 Type 2 diabetes mellitus without complications: Secondary | ICD-10-CM | POA: Insufficient documentation

## 2013-09-09 DIAGNOSIS — G4730 Sleep apnea, unspecified: Secondary | ICD-10-CM | POA: Insufficient documentation

## 2013-09-09 DIAGNOSIS — Z8742 Personal history of other diseases of the female genital tract: Secondary | ICD-10-CM | POA: Insufficient documentation

## 2013-09-09 DIAGNOSIS — Z79899 Other long term (current) drug therapy: Secondary | ICD-10-CM | POA: Insufficient documentation

## 2013-09-09 DIAGNOSIS — Z87891 Personal history of nicotine dependence: Secondary | ICD-10-CM | POA: Insufficient documentation

## 2013-09-09 DIAGNOSIS — Z87448 Personal history of other diseases of urinary system: Secondary | ICD-10-CM | POA: Insufficient documentation

## 2013-09-09 DIAGNOSIS — J45901 Unspecified asthma with (acute) exacerbation: Secondary | ICD-10-CM

## 2013-09-09 DIAGNOSIS — E781 Pure hyperglyceridemia: Secondary | ICD-10-CM | POA: Insufficient documentation

## 2013-09-09 DIAGNOSIS — J441 Chronic obstructive pulmonary disease with (acute) exacerbation: Secondary | ICD-10-CM | POA: Insufficient documentation

## 2013-09-09 DIAGNOSIS — K589 Irritable bowel syndrome without diarrhea: Secondary | ICD-10-CM | POA: Insufficient documentation

## 2013-09-09 DIAGNOSIS — I509 Heart failure, unspecified: Secondary | ICD-10-CM | POA: Insufficient documentation

## 2013-09-09 HISTORY — DX: Other hemorrhoids: K64.8

## 2013-09-09 LAB — URINALYSIS, ROUTINE W REFLEX MICROSCOPIC
Bilirubin Urine: NEGATIVE
Glucose, UA: 250 mg/dL — AB
Hgb urine dipstick: NEGATIVE
Ketones, ur: NEGATIVE mg/dL
Leukocytes, UA: NEGATIVE
Nitrite: NEGATIVE
Protein, ur: NEGATIVE mg/dL
Specific Gravity, Urine: 1.015 (ref 1.005–1.030)
Urobilinogen, UA: 0.2 mg/dL (ref 0.0–1.0)
pH: 5.5 (ref 5.0–8.0)

## 2013-09-09 LAB — BASIC METABOLIC PANEL
BUN: 23 mg/dL (ref 6–23)
CO2: 29 mEq/L (ref 19–32)
Calcium: 10 mg/dL (ref 8.4–10.5)
Chloride: 103 mEq/L (ref 96–112)
Creatinine, Ser: 1.07 mg/dL (ref 0.50–1.10)
GFR calc Af Amer: 66 mL/min — ABNORMAL LOW (ref >90)
GFR calc non Af Amer: 57 mL/min — ABNORMAL LOW (ref >90)
Glucose, Bld: 152 mg/dL — ABNORMAL HIGH (ref 70–99)
Potassium: 4.4 mEq/L (ref 3.5–5.1)
Sodium: 142 mEq/L (ref 135–145)

## 2013-09-09 LAB — CBC
HCT: 41 % (ref 36.0–46.0)
Hemoglobin: 13.9 g/dL (ref 12.0–15.0)
MCH: 32 pg (ref 26.0–34.0)
MCHC: 33.9 g/dL (ref 30.0–36.0)
MCV: 94.3 fL (ref 78.0–100.0)
Platelets: 321 10*3/uL (ref 150–400)
RBC: 4.35 MIL/uL (ref 3.87–5.11)
RDW: 13.2 % (ref 11.5–15.5)
WBC: 14.1 10*3/uL — ABNORMAL HIGH (ref 4.0–10.5)

## 2013-09-09 LAB — PRO B NATRIURETIC PEPTIDE: Pro B Natriuretic peptide (BNP): 74.7 pg/mL (ref 0–125)

## 2013-09-09 LAB — TROPONIN I: Troponin I: <0.3 ng/mL (ref <0.30)

## 2013-09-09 LAB — D-DIMER, QUANTITATIVE (NOT AT ARMC): D-Dimer, Quant: 0.35 ug/mL-FEU (ref 0.00–0.48)

## 2013-09-09 MED ORDER — PREDNISONE 20 MG PO TABS: 40.0000 mg | ORAL_TABLET | Freq: Every day | ORAL | Status: DC

## 2013-09-09 MED ORDER — ALBUTEROL (5 MG/ML) CONTINUOUS INHALATION SOLN
10.0000 mg/h | INHALATION_SOLUTION | Freq: Once | RESPIRATORY_TRACT | Status: AC
  Administered 2013-09-09: 10 mg/h via RESPIRATORY_TRACT
  Filled 2013-09-09: qty 20

## 2013-09-09 MED ORDER — IPRATROPIUM BROMIDE 0.02 % IN SOLN
1.0000 mg | Freq: Once | RESPIRATORY_TRACT | Status: AC
  Administered 2013-09-09: 1 mg via RESPIRATORY_TRACT
  Filled 2013-09-09: qty 5

## 2013-09-09 MED ORDER — IPRATROPIUM BROMIDE 0.02 % IN SOLN
0.5000 mg | Freq: Once | RESPIRATORY_TRACT | Status: AC
  Administered 2013-09-09: 0.5 mg via RESPIRATORY_TRACT
  Filled 2013-09-09: qty 2.5

## 2013-09-09 MED ORDER — PREDNISONE 50 MG PO TABS
60.0000 mg | ORAL_TABLET | Freq: Once | ORAL | Status: AC
  Administered 2013-09-09: 60 mg via ORAL
  Filled 2013-09-09: qty 1

## 2013-09-09 MED ORDER — ALBUTEROL SULFATE (5 MG/ML) 0.5% IN NEBU
5.0000 mg | INHALATION_SOLUTION | Freq: Once | RESPIRATORY_TRACT | Status: AC
  Administered 2013-09-09: 5 mg via RESPIRATORY_TRACT
  Filled 2013-09-09: qty 1

## 2013-09-09 NOTE — ED Notes (Signed)
Ambulated pt around nurses station, O2 sats dropped to 93%. Pt became short of breath & had to sit down half way through ambulation.

## 2013-09-09 NOTE — ED Notes (Signed)
Pt c/o SOB since 1300 today, with no relief with neb treatment or inhaler, pain with urination

## 2013-09-09 NOTE — ED Notes (Signed)
Pt left department w/ discharge papers by wheel chair. No further questions.

## 2013-09-09 NOTE — ED Notes (Signed)
Ambulated pt around the nurses station; O2 stats stayed between 94% and 100%.

## 2013-09-09 NOTE — ED Notes (Signed)
Pt states she smelled something at a store last week, pt states "been going down hill since". Says having tremors & having pain w/ urination is her main reason for being here.

## 2013-09-09 NOTE — ED Provider Notes (Signed)
CSN: [id number]     Arrival date & time 09/09/13  1621 History   First MD Initiated Contact with Patient 09/09/13 1908     Chief Complaint  Patient presents with  . Shortness of Breath  . Dysuria    HPI Pt was seen at 1910. Per pt, c/o gradual onset and persistence of constant SOB, cough and wheezing for the past 1 week. Pt states she was "exposed to a chemical smell" while walking through a store before her symptoms began. States she has been using her home MDI and neb without relief. Pt states she was evaluated by Dr. [name] several days ago for same, rx course of prednisone (which she completed yesterday) without change in her symptoms. Pt also c/o gradual onset and persistence of constant dysuria since earlier today. Describes her urine as "black." Denies hematuria, no vaginal bleeding/discharge, no black or blood in stools, no CP/palpitations, no abd pain, no N/V/D, no flank pain, no rash, no fevers, no sore throat.    Past Medical History  Diagnosis Date  . CHF (congestive heart failure)     Normal left ventricular systolic function, ejection fraction 55-60% 07/2007 by cath.  . Diabetes mellitus   . Sleep apnea     CPAP use  . Pulmonary HTN     RHC 2008:  RA pressures 13/10 with a mean of 7 mmHg.  RV pressure 123456 with end diastolic pressure of 15 mmHg.  PA pressure 49/23 with a mean of 35 mmHg.  Pulmonary capillary wedge 17/50 with a mean of 14 mmHg. The PA saturation was 68%.  RA saturation was 71% and aortic saturation was 90%.  Cardiac output was 6.0 with a cardiac index of 2.80 by Fick.  Normal coronaries by cath 2008.  . Morbid obesity     s/p lap band surgery  . Asthma   . Arthritis   . Hypertriglyceridemia   . COPD (chronic obstructive pulmonary disease)   . IBS (irritable bowel syndrome)   . Renal insufficiency   . Chronic back pain   . Chronic hip pain   . Sciatica of right side   . Uterine fibroid   . Internal hemorrhoids    Past Surgical History  Procedure  Laterality Date  . Laparoscopic cholecystectomy  1985  . Laparoscopic gastric banding  12/12/2010  . Pilonidal cyst excision  1974  . Tumor excision  10/2011    left thigh  . Tumor excision  10/2011    on face  . Colonoscopy  10/18/10    SLF:2-mm sessile sigmoid polyp/diverticula  . Hemorrhoid banding  07/2012    Dr. Oneida Alar  . Cardiac catheterization  07/2007, 05/2012    normal coronary arteries   Family History  Problem Relation Age of Onset  . Breast cancer Sister   . Breast cancer Paternal Aunt   . Diabetes Mother   . Heart failure Mother    History  Substance Use Topics  . Smoking status: Former Smoker    Quit date: 08/08/1996  . Smokeless tobacco: Never Used  . Alcohol Use: No    Review of Systems ROS: Statement: All systems negative except as marked or noted in the HPI; Constitutional: Negative for fever and chills. ; ; Eyes: Negative for eye pain, redness and discharge. ; ; ENMT: Negative for ear pain, hoarseness, nasal congestion, sinus pressure and sore throat. ; ; Cardiovascular: +SOB. Negative for chest pain, palpitations, diaphoresis, and peripheral edema. ; ; Respiratory: Negative for cough, wheezing and stridor. ; ;  Gastrointestinal: Negative for nausea, vomiting, diarrhea, abdominal pain, blood in stool, hematemesis, jaundice and rectal bleeding. . ; ; Genitourinary: +dysuria. Negative for flank pain and hematuria. ; ; GYN:  No vaginal bleeding, no vaginal discharge, no vulvar pain.;; Musculoskeletal: Negative for back pain and neck pain. Negative for swelling and trauma.; ; Skin: Negative for pruritus, rash, abrasions, blisters, bruising and skin lesion.; ; Neuro: Negative for headache, lightheadedness and neck stiffness. Negative for weakness, altered level of consciousness , altered mental status, extremity weakness, paresthesias, involuntary movement, seizure and syncope.      Allergies  Aspirin; Ciprofloxacin; and Latex  Home Medications   Current Outpatient  Rx  Name  Route  Sig  Dispense  Refill  . albuterol (PROVENTIL HFA;VENTOLIN HFA) 108 (90 BASE) MCG/ACT inhaler   Inhalation   Inhale 2 puffs into the lungs every 6 (six) hours as needed. FOR SHORTNESS OF BREATH/WHEEZING         . amLODipine (NORVASC) 10 MG tablet   Oral   Take 1 tablet by mouth daily.         . beclomethasone (QVAR) 80 MCG/ACT inhaler   Inhalation   Inhale 2 puffs into the lungs 2 (two) times daily as needed. FOR SHORTNESS OF BREATH/WHEEZING         . dicyclomine (BENTYL) 10 MG capsule   Oral   Take 10 mg by mouth 4 (four) times daily -  before meals and at bedtime.         . fenofibrate 160 MG tablet   Oral   Take 160 mg by mouth every morning.          . fluticasone (FLONASE) 50 MCG/ACT nasal spray   Nasal   Place 1-2 sprays into the nose daily.          Marland Kitchen ipratropium-albuterol (DUONEB) 0.5-2.5 (3) MG/3ML SOLN   Nebulization   Take 3 mLs by nebulization every 4 (four) hours as needed. Shortness of Breath         . isosorbide mononitrate (IMDUR) 30 MG 24 hr tablet   Oral   Take 30 mg by mouth every morning.          . loratadine (CLARITIN) 10 MG tablet   Oral   Take 10 mg by mouth daily.          Marland Kitchen losartan (COZAAR) 100 MG tablet   Oral   Take 100 mg by mouth every morning.          . metFORMIN (GLUCOPHAGE) 500 MG tablet   Oral   Take 1 tablet by mouth 2 (two) times daily.         . Multiple Vitamin (MULTIVITAMIN WITH MINERALS) TABS   Oral   Take 1 tablet by mouth daily.         Marland Kitchen oxyCODONE-acetaminophen (PERCOCET) 10-325 MG per tablet   Oral   Take 1 tablet by mouth every 6 (six) hours as needed for pain.          . potassium chloride SA (K-DUR,KLOR-CON) 20 MEQ tablet   Oral   Take 20 mEq by mouth daily.          . nitroGLYCERIN (NITROSTAT) 0.4 MG SL tablet   Sublingual   Place 0.4 mg under the tongue every 5 (five) minutes as needed. Chest Pains         . torsemide (DEMADEX) 20 MG tablet   Oral   Take 20  mg by mouth daily as needed. Swelling  BP 186/70  Pulse 87  Temp(Src) 98.4 F (36.9 C) (Oral)  Resp 18  Ht 4\' 9"  (1.448 m)  Wt 285 lb (129.275 kg)  BMI 61.66 kg/m2  SpO2 97% Physical Exam 1915: Physical examination:  Nursing notes reviewed; Vital signs and O2 SAT reviewed;  Constitutional: Well developed, Well nourished, Well hydrated, In no acute distress; Head:  Normocephalic, atraumatic; Eyes: EOMI, PERRL, No scleral icterus; ENMT: Mouth and pharynx normal, Mucous membranes moist; Neck: Supple, Full range of motion, No lymphadenopathy; Cardiovascular: Regular rate and rhythm, No murmur, rub, or gallop; Respiratory: Breath sounds clear & equal bilaterally, No rales, rhonchi, wheezes.  Speaking full sentences with ease, Normal respiratory effort/excursion; Chest: Nontender, Movement normal; Abdomen: Soft, Nontender, Nondistended, Normal bowel sounds; Genitourinary: No CVA tenderness; Extremities: Pulses normal, No tenderness, No edema, No calf edema or asymmetry.; Neuro: AA&Ox3, Major CN grossly intact.  Speech clear. No gross focal motor or sensory deficits in extremities.; Skin: Color normal, Warm, Dry.   ED Course  Procedures   MDM  MDM Reviewed: previous chart, nursing note and vitals Reviewed previous: ECG and labs Interpretation: x-ray, labs and ECG   Dg Chest 2 View 09/09/2013   CLINICAL DATA:  Shortness of Breath.  EXAM: CHEST  2 VIEW  COMPARISON:  11/17/2012  FINDINGS: Mild peribronchial thickening. Heart is borderline in size. No confluent opacities. No effusions. No acute bony abnormality.  IMPRESSION: Mild bronchitic changes.   Electronically Signed   By: Rolm Baptise M.D.   On: 09/09/2013 17:30      Date: 09/09/2013  Rate: 81  Rhythm: normal sinus rhythm  QRS Axis: normal  Intervals: normal  ST/T Wave abnormalities: nonspecific T wave changes  Conduction Disutrbances:none  Narrative Interpretation: LVH  Old EKG Reviewed: unchanged; no significant changes  from previous EKG dated 11/17/2012.    Results for orders placed during the hospital encounter of 09/09/13  URINALYSIS, ROUTINE W REFLEX MICROSCOPIC      Result Value Range   Color, Urine YELLOW  YELLOW   APPearance HAZY (*) CLEAR   Specific Gravity, Urine 1.015  1.005 - 1.030   pH 5.5  5.0 - 8.0   Glucose, UA 250 (*) NEGATIVE mg/dL   Hgb urine dipstick NEGATIVE  NEGATIVE   Bilirubin Urine NEGATIVE  NEGATIVE   Ketones, ur NEGATIVE  NEGATIVE mg/dL   Protein, ur NEGATIVE  NEGATIVE mg/dL   Urobilinogen, UA 0.2  0.0 - 1.0 mg/dL   Nitrite NEGATIVE  NEGATIVE   Leukocytes, UA NEGATIVE  NEGATIVE  PRO B NATRIURETIC PEPTIDE      Result Value Range   Pro B Natriuretic peptide (BNP) 74.7  0 - 125 pg/mL  D-DIMER, QUANTITATIVE      Result Value Range   D-Dimer, Quant 0.35  0.00 - 0.48 ug/mL-FEU  CBC      Result Value Range   WBC 14.1 (*) 4.0 - 10.5 K/uL   RBC 4.35  3.87 - 5.11 MIL/uL   Hemoglobin 13.9  12.0 - 15.0 g/dL   HCT 41.0  36.0 - 46.0 %   MCV 94.3  78.0 - 100.0 fL   MCH 32.0  26.0 - 34.0 pg   MCHC 33.9  30.0 - 36.0 g/dL   RDW 13.2  11.5 - 15.5 %   Platelets 321  150 - 400 K/uL  BASIC METABOLIC PANEL      Result Value Range   Sodium 142  135 - 145 mEq/L   Potassium 4.4  3.5 - 5.1 mEq/L  Chloride 103  96 - 112 mEq/L   CO2 29  19 - 32 mEq/L   Glucose, Bld 152 (*) 70 - 99 mg/dL   BUN 23  6 - 23 mg/dL   Creatinine, Ser 1.07  0.50 - 1.10 mg/dL   Calcium 10.0  8.4 - 10.5 mg/dL   GFR calc non Af Amer 57 (*) >90 mL/min   GFR calc Af Amer 66 (*) >90 mL/min  TROPONIN I      Result Value Range   Troponin I <0.30  <0.30 ng/mL   Dg Chest 2 View 09/09/2013   CLINICAL DATA:  Shortness of Breath.  EXAM: CHEST  2 VIEW  COMPARISON:  11/17/2012  FINDINGS: Mild peribronchial thickening. Heart is borderline in size. No confluent opacities. No effusions. No acute bony abnormality.  IMPRESSION: Mild bronchitic changes.   Electronically Signed   By: Rolm Baptise M.D.   On: 09/09/2013 17:30     2045:  Neb completed. States she "coughed up a lot of phlegm." Pt ambulated with O2 Sat dropping to 93% R/A with pt c/o increasing SOB and RR.  Continues to deny CP/palpitations.  Will check EKG and labs. Will dose hour long neb and prednisone.   2200:  Workup reassuring. Hour long neb in process. States she is starting to feel "better now" with the continuous neb treatment. Lungs CTA bilat, Sats 98%, resps without distress, speaking full sentences with ease. Appears comfortable watching TV. States she "has enough" neb solution and MDI at home already. Will rx prednisone. Anticipate d/c after neb completed. Sign out to Dr. Betsey Holiday.     Alfonzo Feller, DO 09/09/13 2214

## 2013-09-09 NOTE — Progress Notes (Signed)
Rt Note: 10MG  Albuterol /atrovent CAT started. Per pt she is SOB, BBS clear, SpO2 98% on RA. RR 18-22. No distress noted at this time. Pt speaking in complete sentences. Rt will continue to monitor

## 2013-09-30 ENCOUNTER — Encounter (HOSPITAL_COMMUNITY): Payer: Self-pay

## 2013-09-30 ENCOUNTER — Emergency Department (HOSPITAL_COMMUNITY)
Admission: EM | Admit: 2013-09-30 | Discharge: 2013-09-30 | Disposition: A | Discharge status: Home / Self Care | Payer: Medicare HMO | Attending: Emergency Medicine | Admitting: Emergency Medicine

## 2013-09-30 DIAGNOSIS — J449 Chronic obstructive pulmonary disease, unspecified: Secondary | ICD-10-CM | POA: Insufficient documentation

## 2013-09-30 DIAGNOSIS — J4489 Other specified chronic obstructive pulmonary disease: Secondary | ICD-10-CM | POA: Insufficient documentation

## 2013-09-30 DIAGNOSIS — IMO0002 Reserved for concepts with insufficient information to code with codable children: Secondary | ICD-10-CM | POA: Insufficient documentation

## 2013-09-30 DIAGNOSIS — T7840XA Allergy, unspecified, initial encounter: Secondary | ICD-10-CM

## 2013-09-30 DIAGNOSIS — Z79899 Other long term (current) drug therapy: Secondary | ICD-10-CM | POA: Insufficient documentation

## 2013-09-30 DIAGNOSIS — Z91040 Latex allergy status: Secondary | ICD-10-CM | POA: Insufficient documentation

## 2013-09-30 DIAGNOSIS — G8929 Other chronic pain: Secondary | ICD-10-CM | POA: Insufficient documentation

## 2013-09-30 DIAGNOSIS — R220 Localized swelling, mass and lump, head: Secondary | ICD-10-CM | POA: Insufficient documentation

## 2013-09-30 DIAGNOSIS — Z8742 Personal history of other diseases of the female genital tract: Secondary | ICD-10-CM | POA: Insufficient documentation

## 2013-09-30 DIAGNOSIS — M129 Arthropathy, unspecified: Secondary | ICD-10-CM | POA: Insufficient documentation

## 2013-09-30 DIAGNOSIS — Z87891 Personal history of nicotine dependence: Secondary | ICD-10-CM | POA: Insufficient documentation

## 2013-09-30 DIAGNOSIS — E119 Type 2 diabetes mellitus without complications: Secondary | ICD-10-CM | POA: Insufficient documentation

## 2013-09-30 DIAGNOSIS — G4730 Sleep apnea, unspecified: Secondary | ICD-10-CM | POA: Insufficient documentation

## 2013-09-30 DIAGNOSIS — Z9889 Other specified postprocedural states: Secondary | ICD-10-CM | POA: Insufficient documentation

## 2013-09-30 DIAGNOSIS — R5381 Other malaise: Secondary | ICD-10-CM | POA: Insufficient documentation

## 2013-09-30 DIAGNOSIS — I509 Heart failure, unspecified: Secondary | ICD-10-CM | POA: Insufficient documentation

## 2013-09-30 DIAGNOSIS — N289 Disorder of kidney and ureter, unspecified: Secondary | ICD-10-CM | POA: Insufficient documentation

## 2013-09-30 DIAGNOSIS — Z8719 Personal history of other diseases of the digestive system: Secondary | ICD-10-CM | POA: Insufficient documentation

## 2013-09-30 DIAGNOSIS — Z9981 Dependence on supplemental oxygen: Secondary | ICD-10-CM | POA: Insufficient documentation

## 2013-09-30 DIAGNOSIS — R51 Headache: Secondary | ICD-10-CM | POA: Insufficient documentation

## 2013-09-30 LAB — GLUCOSE, CAPILLARY: Glucose-Capillary: 114 mg/dL — ABNORMAL HIGH (ref 70–99)

## 2013-09-30 MED ORDER — DIPHENHYDRAMINE HCL 25 MG PO TABS: 25.0000 mg | ORAL_TABLET | Freq: Four times a day (QID) | ORAL | Status: DC

## 2013-09-30 MED ORDER — RANITIDINE HCL 150 MG PO TABS: 150.0000 mg | ORAL_TABLET | Freq: Two times a day (BID) | ORAL | Status: DC

## 2013-09-30 MED ORDER — PREDNISONE 50 MG PO TABS
60.0000 mg | ORAL_TABLET | Freq: Once | ORAL | Status: AC
  Administered 2013-09-30: 60 mg via ORAL
  Filled 2013-09-30 (×2): qty 1

## 2013-09-30 MED ORDER — FAMOTIDINE IN NACL 20-0.9 MG/50ML-% IV SOLN
20.0000 mg | Freq: Once | INTRAVENOUS | Status: AC
  Administered 2013-09-30: 20 mg via INTRAVENOUS
  Filled 2013-09-30: qty 50

## 2013-09-30 MED ORDER — SODIUM CHLORIDE 0.9 % IV BOLUS (SEPSIS)
1000.0000 mL | Freq: Once | INTRAVENOUS | Status: AC
  Administered 2013-09-30: 1000 mL via INTRAVENOUS

## 2013-09-30 MED ORDER — DIPHENHYDRAMINE HCL 50 MG/ML IJ SOLN
25.0000 mg | Freq: Once | INTRAMUSCULAR | Status: AC
  Administered 2013-09-30: 25 mg via INTRAVENOUS
  Filled 2013-09-30: qty 1

## 2013-09-30 NOTE — ED Notes (Signed)
Pt reports that she started ceftin  Has taken 2 doses. and her tongue has been swollen today. First noted 2 hours ago, able to speak clearly, and answer all ?'s, is on antibiotic for a uti.  "just don't feel good"

## 2013-09-30 NOTE — ED Provider Notes (Signed)
CSN: [id number]     Arrival date & time 09/30/13  1351 History  This chart was scribed for Sara Siren, MD by Roxan Diesel, ED scribe.  This patient was seen in room APA19/APA19 and the patient's care was started at 2:51 PM.  Chief Complaint  Patient presents with  . Allergic Reaction    Patient is a 57 y.o. female presenting with allergic reaction. The history is provided by the patient. No language interpreter was used.  Allergic Reaction Presenting symptoms: swelling   Presenting symptoms: no rash   Swelling:    Location: sensation of tongue swelling.   Timing:  Constant   Progression:  Worsening   Chronicity:  New Severity:  Mild Context: medications   Relieved by:  None tried Worsened by:  Nothing tried Ineffective treatments:  None tried   HPI Comments: Sara Tran is a 57 y.o. female with h/o CHF, COPD, DM, and renal insufficiency who presents to the Emergency Department complaining of a possible allergic reaction to her antibiotics.  Pt states that she was recently diagnosed with a UTI and placed on Ceftin which she began taking last night.  She states that she was in her normal health last night and this morning but 2 hours ago she developed a sensation of tongue swelling.  She also complains of a headache and states she feels generally weak and unwell.  She denies any rash, changes to her chronic SOB, or new-onset nausea since beginning taking her Ceftin.   Past Medical History  Diagnosis Date  . CHF (congestive heart failure)     Normal left ventricular systolic function, ejection fraction 55-60% 07/2007 by cath.  . Diabetes mellitus   . Sleep apnea     CPAP use  . Pulmonary HTN     RHC 2008:  RA pressures 13/10 with a mean of 7 mmHg.  RV pressure 123456 with end diastolic pressure of 15 mmHg.  PA pressure 49/23 with a mean of 35 mmHg.  Pulmonary capillary wedge 17/50 with a mean of 14 mmHg. The PA saturation was 68%.  RA saturation was 71% and aortic  saturation was 90%.  Cardiac output was 6.0 with a cardiac index of 2.80 by Fick.  Normal coronaries by cath 2008.  . Morbid obesity     s/p lap band surgery  . Asthma   . Arthritis   . Hypertriglyceridemia   . COPD (chronic obstructive pulmonary disease)   . IBS (irritable bowel syndrome)   . Renal insufficiency   . Chronic back pain   . Chronic hip pain   . Sciatica of right side   . Uterine fibroid   . Internal hemorrhoids     Past Surgical History  Procedure Laterality Date  . Laparoscopic cholecystectomy  1985  . Laparoscopic gastric banding  12/12/2010  . Pilonidal cyst excision  1974  . Tumor excision  10/2011    left thigh  . Tumor excision  10/2011    on face  . Colonoscopy  10/18/10    SLF:2-mm sessile sigmoid polyp/diverticula  . Hemorrhoid banding  07/2012    Dr. Oneida Alar  . Cardiac catheterization  07/2007, 05/2012    normal coronary arteries    Family History  Problem Relation Age of Onset  . Breast cancer Sister   . Breast cancer Paternal Aunt   . Diabetes Mother   . Heart failure Mother     History  Substance Use Topics  . Smoking status: Former Smoker  Quit date: 08/08/1996  . Smokeless tobacco: Never Used  . Alcohol Use: No    OB History   Grav Para Term Preterm Abortions TAB SAB Ect Mult Living                  Review of Systems  HENT: Positive for facial swelling (sensation of tongue swelling).   Respiratory: Positive for shortness of breath (chronic).   Skin: Negative for rash.  Neurological: Positive for weakness (generalized) and headaches.  All other systems reviewed and are negative.     Allergies  Aspirin; Ciprofloxacin; and Latex  Home Medications   Current Outpatient Rx  Name  Route  Sig  Dispense  Refill  . albuterol (PROVENTIL HFA;VENTOLIN HFA) 108 (90 BASE) MCG/ACT inhaler   Inhalation   Inhale 2 puffs into the lungs every 6 (six) hours as needed. FOR SHORTNESS OF BREATH/WHEEZING         . amLODipine (NORVASC)  10 MG tablet   Oral   Take 1 tablet by mouth daily.         . beclomethasone (QVAR) 80 MCG/ACT inhaler   Inhalation   Inhale 2 puffs into the lungs 2 (two) times daily as needed. FOR SHORTNESS OF BREATH/WHEEZING         . dicyclomine (BENTYL) 10 MG capsule   Oral   Take 10 mg by mouth 4 (four) times daily -  before meals and at bedtime.         . fenofibrate 160 MG tablet   Oral   Take 160 mg by mouth every morning.          . fluticasone (FLONASE) 50 MCG/ACT nasal spray   Nasal   Place 1-2 sprays into the nose daily.          Marland Kitchen ipratropium-albuterol (DUONEB) 0.5-2.5 (3) MG/3ML SOLN   Nebulization   Take 3 mLs by nebulization every 4 (four) hours as needed. Shortness of Breath         . isosorbide mononitrate (IMDUR) 30 MG 24 hr tablet   Oral   Take 30 mg by mouth every morning.          . loratadine (CLARITIN) 10 MG tablet   Oral   Take 10 mg by mouth daily.          Marland Kitchen losartan (COZAAR) 100 MG tablet   Oral   Take 100 mg by mouth every morning.          . metFORMIN (GLUCOPHAGE) 500 MG tablet   Oral   Take 1 tablet by mouth 2 (two) times daily.         . Multiple Vitamin (MULTIVITAMIN WITH MINERALS) TABS   Oral   Take 1 tablet by mouth daily.         . nitroGLYCERIN (NITROSTAT) 0.4 MG SL tablet   Sublingual   Place 0.4 mg under the tongue every 5 (five) minutes as needed. Chest Pains         . oxyCODONE-acetaminophen (PERCOCET) 10-325 MG per tablet   Oral   Take 1 tablet by mouth every 6 (six) hours as needed for pain.          . potassium chloride SA (K-DUR,KLOR-CON) 20 MEQ tablet   Oral   Take 20 mEq by mouth daily.          . predniSONE (DELTASONE) 20 MG tablet   Oral   Take 2 tablets (40 mg total) by mouth daily. Start 09/10/2013  10 tablet   0   . torsemide (DEMADEX) 20 MG tablet   Oral   Take 20 mg by mouth daily as needed. Swelling          BP 132/74  Pulse 96  Temp(Src) 98.5 F (36.9 C) (Oral)  Resp 24  Ht  4\' 9"  (1.448 m)  Wt 280 lb (127.007 kg)  BMI 60.57 kg/m2  SpO2 98%  Physical Exam  Nursing note and vitals reviewed. Constitutional: She is oriented to person, place, and time. She appears well-developed and well-nourished. No distress.  HENT:  Head: Normocephalic and atraumatic.  Mouth/Throat: Oropharynx is clear and moist.  No tongue swelling  Eyes: Conjunctivae are normal. Pupils are equal, round, and reactive to light. No scleral icterus.  Neck: Neck supple.  Cardiovascular: Normal rate, regular rhythm, normal heart sounds and intact distal pulses.   No murmur heard. Pulmonary/Chest: Effort normal and breath sounds normal. No stridor. No respiratory distress. She has no wheezes. She has no rales.  Abdominal: Soft. Bowel sounds are normal. She exhibits no distension. There is no tenderness.  Musculoskeletal: Normal range of motion.  Neurological: She is alert and oriented to person, place, and time.  Skin: Skin is warm and dry. No rash noted.  Psychiatric: She has a normal mood and affect. Her behavior is normal.    ED Course  Procedures (including critical care time)  DIAGNOSTIC STUDIES: Oxygen Saturation is 98% on room air, normal by my interpretation.    COORDINATION OF CARE: 2:59 PM: Discussed treatment plan which includes Benadryl, Pepcid, and Deltasone with pt at bedside.  Pt agrees to plan.    Labs Review Labs Reviewed - No data to display  Imaging Review No results found.  MDM   1. Allergic reaction, initial encounter    72 -year-old female presenting because she is concerned that she is having an allergic reaction to a new antibiotic. States she started taking Ceftin yesterday for urinary tract infection. Complains of tongue swelling and pain. She denies new shortness of breath, nausea, rash. She is well appearing and not hypotensive. Her symptoms and exam do not seem consistent with anaphylaxis. However will treat her allergic reaction Benadryl, famotidine,  prednisone.  Observed in ED for several hours with improvement of symptoms.  I think she's safe for discharge home.  She will discuss with her doctor tomorrow as to whether or not she needs to continue antibiotics.    I personally performed the services described in this documentation, which was scribed in my presence. The recorded information has been reviewed and is accurate.    Sara Siren, MD 09/30/13 Einar Crow

## 2013-10-06 ENCOUNTER — Encounter: Payer: Self-pay | Admitting: Adult Health

## 2013-10-06 ENCOUNTER — Ambulatory Visit (INDEPENDENT_AMBULATORY_CARE_PROVIDER_SITE_OTHER): Payer: Medicare HMO | Admitting: Adult Health

## 2013-10-06 VITALS — BP 150/90 | Ht <= 58 in | Wt 283.0 lb

## 2013-10-06 DIAGNOSIS — N898 Other specified noninflammatory disorders of vagina: Secondary | ICD-10-CM

## 2013-10-06 DIAGNOSIS — N951 Menopausal and female climacteric states: Secondary | ICD-10-CM

## 2013-10-06 DIAGNOSIS — L293 Anogenital pruritus, unspecified: Secondary | ICD-10-CM

## 2013-10-06 DIAGNOSIS — N950 Postmenopausal bleeding: Secondary | ICD-10-CM | POA: Insufficient documentation

## 2013-10-06 DIAGNOSIS — R232 Flushing: Secondary | ICD-10-CM | POA: Insufficient documentation

## 2013-10-06 DIAGNOSIS — N949 Unspecified condition associated with female genital organs and menstrual cycle: Secondary | ICD-10-CM | POA: Insufficient documentation

## 2013-10-06 LAB — POCT WET PREP (WET MOUNT): WBC, Wet Prep HPF POC: NEGATIVE

## 2013-10-06 LAB — POCT URINALYSIS DIPSTICK
Blood, UA: NEGATIVE
Glucose, UA: NEGATIVE
Leukocytes, UA: NEGATIVE
Nitrite, UA: NEGATIVE

## 2013-10-06 LAB — TSH: TSH: 1.707 u[IU]/mL (ref 0.350–4.500)

## 2013-10-06 NOTE — Patient Instructions (Addendum)
Pelvic Pain Pelvic pain is pain below the belly button and located between your hips. Acute pain may last a few hours or days. Chronic pelvic pain may last weeks and months. The cause may be different for different types of pain. The pain may be dull or sharp, mild or severe and can interfere with your daily activities. Write down and tell your caregiver:  Exactly where the pain is located. If it comes and goes or is there all the time. When it happens (with sex, urination, bowel movement, etc.) If the pain is related to your menstrual period or stress. Your caregiver will take a full history and do a complete physical exam and Pap test. CAUSES  Painful menstrual periods (dysmenorrhea). Normal ovulation (Mittelschmertz) that occurs in the middle of the menstrual cycle every month. The pelvic organs get engorged with blood just before the menstrual period (pelvic congestive syndrome). Scar tissue from an infection or past surgery (pelvic adhesions). Cancer of the female pelvic organs. When there is pain with cancer, it has been there for a long time. The lining of the uterus (endometrium) abnormally grows in places like the pelvis and on the pelvic organs (endometriosis). A form of endometriosis with the lining of the uterus present inside of the muscle tissue of the uterus (adenomyosis). Fibroid tumor (noncancerous) in the uterus. Bladder problems such as infection, bladder spasms of the muscle tissue of the bladder. Intestinal problems (irritable bowel syndrome, colitis, an ulcer or gastrointestinal infection). Polyps of the cervix or uterus. Pregnancy in the tube (ectopic pregnancy). The opening of the cervix is too small for the menstrual blood to flow through it (cervical stenosis). Physical or sexual abuse (past or present). Musculo-skeletal problems from poor posture, problems with the vertebrae of the lower back or the uterine pelvic muscles falling (prolapse). Psychological problems  such as depression or stress. IUD (intrauterine device) in the uterus. DIAGNOSIS  Tests to make a diagnosis depends on the type, location, severity and what causes the pain to occur. Tests that may be needed include: Blood tests. Urine tests Ultrasound. X-rays. CT Scan. MRI. Laparoscopy. Major surgery. TREATMENT  Treatment will depend on the cause of the pain, which includes: Prescription or over-the-counter pain medication. Antibiotics. Birth control pills. Hormone treatment. Nerve blocking injections. Physical therapy. Antidepressants. Counseling with a psychiatrist or psychologist. Minor or major surgery. HOME CARE INSTRUCTIONS  Only take over-the-counter or prescription medicines for pain, discomfort or fever as directed by your caregiver. Follow your caregiver's advice to treat your pain. Rest. Avoid sexual intercourse if it causes the pain. Apply warm or cold compresses (which ever works best) to the pain area. Do relaxation exercises such as yoga or meditation. Try acupuncture. Avoid stressful situations. Try group therapy. If the pain is because of a stomach/intestinal upset, drink clear liquids, eat a bland light food diet until the symptoms go away. SEEK MEDICAL CARE IF:  You need stronger prescription pain medication. You develop pain with sexual intercourse. You have pain with urination. You develop a temperature of 102 F (38.9 C) with the pain. You are still in pain after 4 hours of taking prescription medication for the pain. You need depression medication. Your IUD is causing pain and you want it removed. SEEK IMMEDIATE MEDICAL CARE IF: You develop very severe pain or tenderness. You faint, have chills, severe weakness or dehydration. You develop heavy vaginal bleeding or passing solid tissue. You develop a temperature of 102 F (38.9 C) with the pain. You have blood in  the urine. You are being physically or sexually abused. You have uncontrolled  vomiting and diarrhea. You are depressed and afraid of harming yourself or someone else. Document Released: 01/24/2005 Document Revised: 03/10/2012 Document Reviewed: 10/21/2008 [facility] Patient Information 2013 [place]. Menopause Menopause is the normal time of life when menstrual periods stop completely. Menopause is complete when you have missed 12 consecutive menstrual periods. It usually occurs between the ages of 29 to 78, with an average age of 63. Very rarely does a woman develop menopause before 56 years old. At menopause, your ovaries stop producing the female hormones, estrogen and progesterone. This can cause undesirable symptoms and also affect your health. Sometimes the symptoms may occur 4 to 5 years before the menopause begins. There is no relationship between menopause and:  Oral contraceptives.  Number of children you had.  Race.  The age your menstrual periods started (menarche). Heavy smokers and very thin women may develop menopause earlier in life. CAUSES  The ovaries stop producing the female hormones estrogen and progesterone.  Other causes include:  Surgery to remove both ovaries.  The ovaries stop functioning for no known reason.  Tumors of the pituitary gland in the brain.  Medical disease that affects the ovaries and hormone production.  Radiation treatment to the abdomen or pelvis.  Chemotherapy that affects the ovaries. SYMPTOMS   Hot flashes.  Night sweats.  Decrease in sex drive.  Vaginal dryness and thinning of the vagina causing painful intercourse.  Dryness of the skin and developing wrinkles.  Headaches.  Tiredness.  Irritability.  Memory problems.  Weight gain.  Bladder infections.  Hair growth of the face and chest.  Infertility. More serious symptoms include:  Loss of bone (osteoporosis) causing breaks (fractures).  Depression.  Hardening and narrowing of the arteries (atherosclerosis) causing heart  attacks and strokes. DIAGNOSIS   When the menstrual periods have stopped for 12 straight months.  Physical exam.  Hormone studies of the blood. TREATMENT  There are many treatment choices and nearly as many questions about them. The decisions to treat or not to treat menopausal changes is an individual choice made with your caregiver. Your caregiver can discuss the treatments with you. Together, you can decide which treatment will work best for you. Your treatment choices may include:   Hormone therapy (estorgen and progesterone).  Non-hormonal medications.  Treating the individual symptoms with medication (for example antidepressants for depression).  Herbal medications that may help specific symptoms.  Counseling by a psychiatrist or psychologist.  Group therapy.  Lifestyle changes including:  Eating healthy.  Regular exercise.  Limiting caffeine and alcohol.  Stress management and meditation.  No treatment. HOME CARE INSTRUCTIONS   Take the medication your caregiver gives you as directed.  Get plenty of sleep and rest.  Exercise regularly.  Eat a diet that contains calcium (good for the bones) and soy products (acts like estrogen hormone).  Avoid alcoholic beverages.  Do not smoke.  If you have hot flashes, dress in layers.  Take supplements, calcium and vitamin D to strengthen bones.  You can use over-the-counter lubricants or moisturizers for vaginal dryness.  Group therapy is sometimes very helpful.  Acupuncture may be helpful in some cases. SEEK MEDICAL CARE IF:   You are not sure you are in menopause.  You are having menopausal symptoms and need advice and treatment.  You are still having menstrual periods after age 55.  You have pain with intercourse.  Menopause is complete (no menstrual period for  12 months) and you develop vaginal bleeding.  You need a referral to a specialist (gynecologist, psychiatrist or psychologist) for  treatment. SEEK IMMEDIATE MEDICAL CARE IF:   You have severe depression.  You have excessive vaginal bleeding.  You fell and think you have a broken bone.  You have pain when you urinate.  You develop leg or chest pain.  You have a fast pounding heart beat (palpitations).  You have severe headaches.  You develop vision problems.  You feel a lump in your breast.  You have abdominal pain or severe indigestion. Document Released: 03/08/2004 Document Revised: 03/10/2012 Document Reviewed: 10/14/2008 [facility] Patient Information 2014 [place]. Return in 1 week for [place]

## 2013-10-06 NOTE — Progress Notes (Signed)
Subjective:     Patient ID: Sara Tran, female   DOB: 1956-03-14, 57 y.o.   MRN: [id number]  HPI Heer is a 57 year old black female,single in for complaints of vaginal itch and pelvic pain and hot flashes and she had a period in May or June and had  [place] then,but could not see endometrium..Had pap this summer in [place] [employer].She has been seen in ER for breathing trouble.She also does not sleep well.Has some urinary incontinence at times.Had hemorrhoid banding with Dr Oneida Alar recently.  Review of Systems See HPI for positives    Reviewed past medical,surgical, social and family history. Reviewed medications and allergies.  Current outpatient prescriptions:albuterol (PROVENTIL HFA;VENTOLIN HFA) 108 (90 BASE) MCG/ACT inhaler, Inhale 2 puffs into the lungs every 6 (six) hours as needed. FOR SHORTNESS OF BREATH/WHEEZING, Disp: , Rfl: ;  amLODipine (NORVASC) 10 MG tablet, Take 1 tablet by mouth daily., Disp: , Rfl: ;  beclomethasone (QVAR) 80 MCG/ACT inhaler, Inhale 2 puffs into the lungs 2 (two) times daily as needed. FOR SHORTNESS OF BREATH/WHEEZING, Disp: , Rfl:  dicyclomine (BENTYL) 10 MG capsule, Take 10 mg by mouth 4 (four) times daily -  before meals and at bedtime., Disp: , Rfl: ;  diphenhydrAMINE (BENADRYL) 25 MG tablet, Take 1 tablet (25 mg total) by mouth every 6 (six) hours., Disp: 8 tablet, Rfl: 0;  fenofibrate 160 MG tablet, Take 160 mg by mouth every morning. , Disp: , Rfl: ;  fluticasone (FLONASE) 50 MCG/ACT nasal spray, Place 1-2 sprays into the nose daily. , Disp: , Rfl:  ipratropium-albuterol (DUONEB) 0.5-2.5 (3) MG/3ML SOLN, Take 3 mLs by nebulization every 4 (four) hours as needed. Shortness of Breath, Disp: , Rfl: ;  isosorbide mononitrate (IMDUR) 30 MG 24 hr tablet, Take 30 mg by mouth every morning. , Disp: , Rfl: ;  loratadine (CLARITIN) 10 MG tablet, Take 10 mg by mouth daily. , Disp: , Rfl: ;  losartan (COZAAR) 100 MG tablet, Take 100 mg by mouth every morning. , Disp: , Rfl:   metFORMIN (GLUCOPHAGE) 500 MG tablet, Take 1 tablet by mouth 2 (two) times daily., Disp: , Rfl: ;  oxyCODONE-acetaminophen (PERCOCET) 10-325 MG per tablet, Take 1 tablet by mouth every 6 (six) hours as needed for pain. , Disp: , Rfl: ;  potassium chloride SA (K-DUR,KLOR-CON) 20 MEQ tablet, Take 20 mEq by mouth daily. , Disp: , Rfl:  predniSONE (DELTASONE) 20 MG tablet, Take 2 tablets (40 mg total) by mouth daily. Start 09/10/2013, Disp: 10 tablet, Rfl: 0;  ranitidine (ZANTAC) 150 MG tablet, Take 1 tablet (150 mg total) by mouth 2 (two) times daily., Disp: 4 tablet, Rfl: 0;  torsemide (DEMADEX) 20 MG tablet, Take 20 mg by mouth daily as needed. Swelling, Disp: , Rfl: ;  Multiple Vitamin (MULTIVITAMIN WITH MINERALS) TABS, Take 1 tablet by mouth daily., Disp: , Rfl:  nitroGLYCERIN (NITROSTAT) 0.4 MG SL tablet, Place 0.4 mg under the tongue every 5 (five) minutes as needed. Chest Pains, Disp: , Rfl:  Past Medical History  Diagnosis Date  . CHF (congestive heart failure)     Normal left ventricular systolic function, ejection fraction 55-60% 07/2007 by cath.  . Diabetes mellitus   . Sleep apnea     CPAP use  . Pulmonary HTN     RHC 2008:  RA pressures 13/10 with a mean of 7 mmHg.  RV pressure 123456 with end diastolic pressure of 15 mmHg.  PA pressure 49/23 with a mean of 35  mmHg.  Pulmonary capillary wedge 17/50 with a mean of 14 mmHg. The PA saturation was 68%.  RA saturation was 71% and aortic saturation was 90%.  Cardiac output was 6.0 with a cardiac index of 2.80 by Fick.  Normal coronaries by cath 2008.  . Morbid obesity     s/p lap band surgery  . Asthma   . Arthritis   . Hypertriglyceridemia   . COPD (chronic obstructive pulmonary disease)   . IBS (irritable bowel syndrome)   . Renal insufficiency   . Chronic back pain   . Chronic hip pain   . Sciatica of right side   . Uterine fibroid   . Internal hemorrhoids   . Hot flashes 10/06/2013   Objective:   Physical Exam BP 150/90  Ht 4\' 9"   (1.448 m)  Wt 283 lb (128.368 kg)  BMI 61.22 kg/m2  LMP 06/06/2013   Urine dipstick trace protein, Skin warm and dry.Pelvic: external genitalia is normal in appearance, vagina has scant white discharge without odor, cervix:smooth, uterus: normal size, shape and contour, non tender, no masses felt, adnexa: no masses or tenderness noted.But difficult exam due to body habitus. Wet prep was negative  Assessment:     PMB Pelvic pain Hot flashes  Vaginal itch     Plan:     Check FSH,TSH and get pelvic US in 1 week and see me   Review handouts on pelvic pain and menopause

## 2013-10-07 LAB — FOLLICLE STIMULATING HORMONE: FSH: 49.5 m[IU]/mL

## 2013-10-09 ENCOUNTER — Encounter: Payer: Self-pay | Admitting: Physical Medicine & Rehabilitation

## 2013-10-09 ENCOUNTER — Encounter: Payer: Medicare HMO | Attending: Physical Medicine & Rehabilitation | Admitting: Physical Medicine & Rehabilitation

## 2013-10-09 VITALS — BP 159/67 | HR 91 | Resp 16 | Ht <= 58 in | Wt 290.4 lb

## 2013-10-09 DIAGNOSIS — M4310 Spondylolisthesis, site unspecified: Secondary | ICD-10-CM | POA: Insufficient documentation

## 2013-10-09 DIAGNOSIS — M2420 Disorder of ligament, unspecified site: Secondary | ICD-10-CM | POA: Insufficient documentation

## 2013-10-09 DIAGNOSIS — M629 Disorder of muscle, unspecified: Secondary | ICD-10-CM | POA: Insufficient documentation

## 2013-10-09 DIAGNOSIS — M47816 Spondylosis without myelopathy or radiculopathy, lumbar region: Secondary | ICD-10-CM

## 2013-10-09 DIAGNOSIS — M76899 Other specified enthesopathies of unspecified lower limb, excluding foot: Secondary | ICD-10-CM | POA: Insufficient documentation

## 2013-10-09 DIAGNOSIS — M47817 Spondylosis without myelopathy or radiculopathy, lumbosacral region: Secondary | ICD-10-CM

## 2013-10-09 DIAGNOSIS — M7061 Trochanteric bursitis, right hip: Secondary | ICD-10-CM

## 2013-10-09 NOTE — Progress Notes (Deleted)
  Subjective:    Patient ID: Sara Tran, female    DOB: [birth year], 57 y.o.   MRN: [id number]  HPI    Review of Systems     Objective:   Physical Exam        Assessment & Plan:

## 2013-10-09 NOTE — Progress Notes (Signed)
Subjective:    Patient ID: Sara Tran, female    DOB: [birth year], 57 y.o.   MRN: [id number]  HPI  This is a follow up visit  For [name]. She was previously seen by Dr. Lucia Tran, and I am now assuming her care. She has had problems with hemorrhoids over the summer and has had multiple bandings per Dr. Oneida Tran in [place].   She had a right hip injection by Dr. Lucia Tran in May which had been working fairly well until a few weeks ago. She notes that her hip frequently "comes out of joint", and she has to do a maneuver to get it "back in place."  She reports recent inhalation of "fumes" which caused her to feel sick and get a URI/UTI. She was placed on abx which caused a reaction. She still feels that her bladder is urgent.     Pain Inventory Average Pain 10 Pain Right Now 10 My pain is sharp, burning and aching  In the last 24 hours, has pain interfered with the following? General activity 8 Relation with others 10 Enjoyment of life 10 What TIME of day is your pain at its worst? all day Sleep (in general) Poor  Pain is worse with: walking, bending, sitting, inactivity, standing and some activites Pain improves with: rest, heat/ice, therapy/exercise, pacing activities, medication and injections Relief from Meds: 6  Mobility walk without assistance use a cane use a walker how many minutes can you walk? 2 ability to climb steps?  no do you drive?  yes Do you have any goals in this area?  yes  Function disabled: date disabled 2008 I need assistance with the following:  dressing, bathing, meal prep, household duties and shopping Do you have any goals in this area?  yes  Neuro/Psych weakness numbness tremor tingling trouble walking spasms dizziness confusion  Prior Studies Any changes since last visit?  no  Physicians involved in your care Any changes since last visit?  no   Family History  Problem Relation Age of Onset  . Breast cancer Paternal Aunt    . Diabetes Mother   . Heart failure Mother   . Breast cancer Sister    History   Social History  . Marital Status: Single    Spouse Name: N/A    Number of Children: 0  . Years of Education: N/A   Occupational History  . Volunteers   . 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 Smoking status: Former Smoker    Quit date: 08/08/1996  . Smokeless tobacco: Never Used  . Alcohol Use: No  . Drug Use: No  . Sexual Activity: No   Other Topics Concern  . Not on file   Social History Narrative  . No narrative on file   Past Surgical History  Procedure Laterality Date  . Laparoscopic cholecystectomy  1985  . Laparoscopic gastric banding  12/12/2010  . Pilonidal cyst excision  1974  . Tumor excision  10/2011    left thigh  . Tumor excision  10/2011    on face  . Colonoscopy  10/18/10    SLF:2-mm sessile sigmoid polyp/diverticula  . Hemorrhoid banding  07/2012    Dr. Oneida Tran  . Cardiac catheterization  07/2007, 05/2012    normal coronary arteries   Past Medical History  Diagnosis Date  . CHF (congestive heart failure)     Normal left ventricular systolic function, ejection fraction 55-60%  07/2007 by cath.  . Diabetes mellitus   . Sleep apnea     CPAP use  . Pulmonary HTN     RHC 2008:  RA pressures 13/10 with a mean of 7 mmHg.  RV pressure 123456 with end diastolic pressure of 15 mmHg.  PA pressure 49/23 with a mean of 35 mmHg.  Pulmonary capillary wedge 17/50 with a mean of 14 mmHg. The PA saturation was 68%.  RA saturation was 71% and aortic saturation was 90%.  Cardiac output was 6.0 with a cardiac index of 2.80 by Fick.  Normal coronaries by cath 2008.  . Morbid obesity     s/p lap band surgery  . Asthma   . Arthritis   . Hypertriglyceridemia   . COPD (chronic obstructive pulmonary disease)   . IBS (irritable bowel syndrome)   . Renal insufficiency   . Chronic back pain   . Chronic hip pain   . Sciatica of right side   .  Uterine fibroid   . Internal hemorrhoids   . Hot flashes 10/06/2013   LMP 06/06/2013     Review of Systems  Constitutional: Positive for diaphoresis and unexpected weight change.  Respiratory: Positive for apnea, cough, shortness of breath and wheezing.   Cardiovascular: Positive for leg swelling.  Gastrointestinal: Positive for nausea, vomiting, abdominal pain and diarrhea.  Endocrine:       High blood sugar  Genitourinary: Positive for decreased urine volume.  Musculoskeletal: Positive for gait problem.  Neurological: Positive for dizziness, tremors, weakness and numbness.       Tingling  Psychiatric/Behavioral: Positive for confusion.  All other systems reviewed and are negative.       Objective:   Physical Exam Well-developed morbidly obese woman who appears her stated age and does not appear in any distress  Cranial nerves are grossly intact.  Coordination is grossly intact  Reflexes are 2+ in the upper extremities and remain diminished in both lower extremities  Motor strength 5/5 upper extremities, 5/5 lower extremities except bilateral hip flexors she has difficulty lifting however abdominal peniculus extends over thighs inhibiting lift.  She reports intact sensation in bilateral upper extremities to light touch and pinprick.  She reports decreased sensation right lower extremity below knee.  Transitions from sit to stand slowly.  Gait wide based, slow  Romberg test performed adequately.  Heel toe walking not assessed due to concern regarding balance/obesity  Lumbar forward flexion mildly limited without significant pain however patient deferred extending past midline with lumbar extension citing pain with extension  Internal mildly exacerbates lateral/ posterior hip pain  Patient was exclusively tender over right greater  SLR neg. FABER neg. Substantial pain with palpation of right greater troch Assessment & Plan:   1. Lumbago with known facet arthropathy/L4-5  anterolisthesis, pain with lumbar extension May have some mild root involvement, the some numbness in the right lower extremity without obvious weakness.  2. Right trochanteric bursitis/iliotibial band syndrome  3. Morbid obesity  Plan:   1. PT has also addressed trochanteric bursitis on right using modalities and manual techniques. This is still painful for her and she notes that it is exacerbated by sleeping on it at night.    2. She is requesting injection into the hip injection today. After informed consent and preparation of the skin with betadine and isopropyl alcohol, I injected 6mg  (1cc) of celestone and 4cc of 1% lidocaine around the right greater trochanter via lateral approach. Additionally, aspiration was performed prior to injection. The  patient tolerated well, and no complications were encountered. Afterward the area was cleaned and dressed. Post- injection instructions were provided.     3. I want her to get back into aquatic therapy, restart an exercise program and be able to walk better.   4. She is currently on oxycodone per Dr. Gerarda Fraction. She will follow up with [place] regarding [place] taking over her narcotic regimen   5. 30 minutes of face to face patient care time were spent during this visit. All questions were encouraged and answered. I'll see her back in 3 months.

## 2013-10-09 NOTE — Patient Instructions (Signed)
WORK ON INCREASING YOUR EXERCISE ACTIVITIES!!!!

## 2013-10-13 ENCOUNTER — Encounter (INDEPENDENT_AMBULATORY_CARE_PROVIDER_SITE_OTHER): Payer: Self-pay

## 2013-10-13 ENCOUNTER — Ambulatory Visit (INDEPENDENT_AMBULATORY_CARE_PROVIDER_SITE_OTHER): Payer: Medicare HMO | Admitting: Adult Health

## 2013-10-13 ENCOUNTER — Ambulatory Visit (INDEPENDENT_AMBULATORY_CARE_PROVIDER_SITE_OTHER): Payer: Medicare HMO

## 2013-10-13 ENCOUNTER — Encounter: Payer: Self-pay | Admitting: Adult Health

## 2013-10-13 VITALS — BP 128/82 | Ht <= 58 in | Wt 290.0 lb

## 2013-10-13 DIAGNOSIS — N951 Menopausal and female climacteric states: Secondary | ICD-10-CM

## 2013-10-13 DIAGNOSIS — N949 Unspecified condition associated with female genital organs and menstrual cycle: Secondary | ICD-10-CM | POA: Insufficient documentation

## 2013-10-13 DIAGNOSIS — N950 Postmenopausal bleeding: Secondary | ICD-10-CM

## 2013-10-13 DIAGNOSIS — D219 Benign neoplasm of connective and other soft tissue, unspecified: Secondary | ICD-10-CM | POA: Insufficient documentation

## 2013-10-13 DIAGNOSIS — R102 Pelvic and perineal pain: Secondary | ICD-10-CM

## 2013-10-13 DIAGNOSIS — N9489 Other specified conditions associated with female genital organs and menstrual cycle: Secondary | ICD-10-CM

## 2013-10-13 DIAGNOSIS — D259 Leiomyoma of uterus, unspecified: Secondary | ICD-10-CM

## 2013-10-13 DIAGNOSIS — R232 Flushing: Secondary | ICD-10-CM

## 2013-10-13 NOTE — Progress Notes (Signed)
Subjective:     Patient ID: Sara Tran, female   DOB: 01/12/1956, 57 y.o.   MRN: [id number]  HPI Sara Tran is back for [place] to evaluate PMB.She is complaining of hot flashes and not sleeping well.  Review of Systems See HPI Reviewed past medical,surgical, social and family history. Reviewed medications and allergies.     Objective:   Physical Exam BP 128/82  Ht 4\' 9"  (1.448 m)  Wt 290 lb (131.543 kg)  BMI 62.74 kg/m2  LMP 06/06/2013   Reviewed labs with pt TSH 1.707 and FSH 49.5, reviewed [place] with pt, it showed 2 small fibroids and a endometrium of 3.7 and right adnexal simple cyst, discussed I did not think she was a candidate for HRT, and that could try brisdelle or do nothing. Discussed with Dr Elonda Husky Assessment:     PMB Hot flashes Fibroids Right adnexal simple cyst    Plan:    Will follow for now  Review handout on brisdelle Call with any problems or questions

## 2013-10-13 NOTE — Patient Instructions (Signed)
Call in follow as needed

## 2013-11-11 ENCOUNTER — Encounter: Payer: Self-pay | Admitting: Gastroenterology

## 2014-01-08 ENCOUNTER — Encounter: Payer: Medicare HMO | Attending: Physical Medicine & Rehabilitation | Admitting: Physical Medicine & Rehabilitation

## 2014-01-08 DIAGNOSIS — M4310 Spondylolisthesis, site unspecified: Secondary | ICD-10-CM | POA: Insufficient documentation

## 2014-01-08 DIAGNOSIS — M629 Disorder of muscle, unspecified: Secondary | ICD-10-CM | POA: Insufficient documentation

## 2014-01-08 DIAGNOSIS — M76899 Other specified enthesopathies of unspecified lower limb, excluding foot: Secondary | ICD-10-CM | POA: Insufficient documentation

## 2014-01-08 DIAGNOSIS — M2420 Disorder of ligament, unspecified site: Secondary | ICD-10-CM | POA: Insufficient documentation

## 2014-05-05 ENCOUNTER — Telehealth (HOSPITAL_COMMUNITY): Payer: Self-pay

## 2014-05-05 NOTE — Telephone Encounter (Signed)
This patient is overdue for recommended follow-up with a bariatric surgeon at [facility]. Call attempted today to reestablish post-op care with [facility], but unable to reach patient by phone.  A letter will be mailed to the patient today to the address on file from [place] advising the patient on the benefits of follow-up care and directing them to call [facility] at [phone] to schedule an appointment at their earliest convenience.  ° °Amanda T. Fleming °Bariatric Office Coordinator °[phone] ° °

## 2014-05-14 ENCOUNTER — Telehealth (HOSPITAL_COMMUNITY): Payer: Self-pay

## 2014-05-14 NOTE — Telephone Encounter (Signed)
This patient is overdue for recommended follow-up with a bariatric surgeon at [facility]. Call attempted on 5/6  to reestablish post-op care with [facility], but unable to reach patient by phone.  A letter was mailed to the patient that same day to the address on file from [place] advising the patient on the benefits of follow-up care and directing them to call [facility] at [phone] to schedule an appointment at their earliest convenience. However, she returned a patient survey instead of calling them to ask that [facility] call her directly at [phone] to schedule an appt. Note included requesting that it be preferably after the 2nd Wednesday of the month. Patient did not provide any other details other than that she would have made an appointment if she were having reoccurring problems. Message has been forwarded to both [employer] at [employer].   Alphonsa Overall. [facility] Bariatric Office Coordinator [phone]

## 2014-05-17 ENCOUNTER — Other Ambulatory Visit: Payer: Self-pay | Admitting: Obstetrics and Gynecology

## 2014-05-17 DIAGNOSIS — Z1231 Encounter for screening mammogram for malignant neoplasm of breast: Secondary | ICD-10-CM

## 2014-06-25 ENCOUNTER — Other Ambulatory Visit: Payer: Self-pay | Admitting: Obstetrics and Gynecology

## 2014-06-25 ENCOUNTER — Ambulatory Visit (HOSPITAL_COMMUNITY)
Admission: RE | Admit: 2014-06-25 | Discharge: 2014-06-25 | Disposition: A | Discharge status: Home / Self Care | Payer: Medicare HMO | Source: Ambulatory Visit | Attending: Obstetrics and Gynecology | Admitting: Obstetrics and Gynecology

## 2014-06-25 DIAGNOSIS — Z1231 Encounter for screening mammogram for malignant neoplasm of breast: Secondary | ICD-10-CM | POA: Insufficient documentation

## 2014-07-09 ENCOUNTER — Encounter (INDEPENDENT_AMBULATORY_CARE_PROVIDER_SITE_OTHER): Payer: Medicare HMO | Admitting: General Surgery

## 2014-07-14 ENCOUNTER — Other Ambulatory Visit: Payer: Medicare HMO | Admitting: Obstetrics and Gynecology

## 2014-08-30 ENCOUNTER — Other Ambulatory Visit: Payer: Self-pay | Admitting: Gastroenterology

## 2014-11-24 ENCOUNTER — Emergency Department (HOSPITAL_COMMUNITY)
Admission: EM | Admit: 2014-11-24 | Discharge: 2014-11-24 | Disposition: A | Discharge status: Home / Self Care | Payer: Medicare HMO | Attending: Emergency Medicine | Admitting: Emergency Medicine

## 2014-11-24 ENCOUNTER — Encounter (HOSPITAL_COMMUNITY): Payer: Self-pay | Admitting: Cardiology

## 2014-11-24 DIAGNOSIS — Z87891 Personal history of nicotine dependence: Secondary | ICD-10-CM | POA: Diagnosis not present

## 2014-11-24 DIAGNOSIS — K589 Irritable bowel syndrome without diarrhea: Secondary | ICD-10-CM | POA: Diagnosis not present

## 2014-11-24 DIAGNOSIS — Z9884 Bariatric surgery status: Secondary | ICD-10-CM | POA: Diagnosis not present

## 2014-11-24 DIAGNOSIS — J45909 Unspecified asthma, uncomplicated: Secondary | ICD-10-CM | POA: Insufficient documentation

## 2014-11-24 DIAGNOSIS — Z79899 Other long term (current) drug therapy: Secondary | ICD-10-CM | POA: Diagnosis not present

## 2014-11-24 DIAGNOSIS — M1990 Unspecified osteoarthritis, unspecified site: Secondary | ICD-10-CM | POA: Insufficient documentation

## 2014-11-24 DIAGNOSIS — M5136 Other intervertebral disc degeneration, lumbar region: Secondary | ICD-10-CM | POA: Insufficient documentation

## 2014-11-24 DIAGNOSIS — G8929 Other chronic pain: Secondary | ICD-10-CM | POA: Insufficient documentation

## 2014-11-24 DIAGNOSIS — G4730 Sleep apnea, unspecified: Secondary | ICD-10-CM | POA: Insufficient documentation

## 2014-11-24 DIAGNOSIS — Z91040 Latex allergy status: Secondary | ICD-10-CM | POA: Insufficient documentation

## 2014-11-24 DIAGNOSIS — Z7982 Long term (current) use of aspirin: Secondary | ICD-10-CM | POA: Insufficient documentation

## 2014-11-24 DIAGNOSIS — I509 Heart failure, unspecified: Secondary | ICD-10-CM | POA: Insufficient documentation

## 2014-11-24 DIAGNOSIS — M62838 Other muscle spasm: Secondary | ICD-10-CM | POA: Insufficient documentation

## 2014-11-24 DIAGNOSIS — Z8742 Personal history of other diseases of the female genital tract: Secondary | ICD-10-CM | POA: Diagnosis not present

## 2014-11-24 DIAGNOSIS — M2550 Pain in unspecified joint: Secondary | ICD-10-CM

## 2014-11-24 DIAGNOSIS — J449 Chronic obstructive pulmonary disease, unspecified: Secondary | ICD-10-CM | POA: Diagnosis not present

## 2014-11-24 DIAGNOSIS — Z7951 Long term (current) use of inhaled steroids: Secondary | ICD-10-CM | POA: Insufficient documentation

## 2014-11-24 DIAGNOSIS — Z87448 Personal history of other diseases of urinary system: Secondary | ICD-10-CM | POA: Diagnosis not present

## 2014-11-24 DIAGNOSIS — M25511 Pain in right shoulder: Secondary | ICD-10-CM | POA: Diagnosis not present

## 2014-11-24 DIAGNOSIS — E119 Type 2 diabetes mellitus without complications: Secondary | ICD-10-CM | POA: Insufficient documentation

## 2014-11-24 DIAGNOSIS — M549 Dorsalgia, unspecified: Secondary | ICD-10-CM

## 2014-11-24 DIAGNOSIS — M545 Low back pain: Secondary | ICD-10-CM | POA: Diagnosis present

## 2014-11-24 MED ORDER — METHOCARBAMOL 500 MG PO TABS: 500.0000 mg | ORAL_TABLET | Freq: Three times a day (TID) | ORAL | Status: DC

## 2014-11-24 MED ORDER — DIAZEPAM 5 MG PO TABS
5.0000 mg | ORAL_TABLET | Freq: Once | ORAL | Status: AC
  Administered 2014-11-24: 5 mg via ORAL
  Filled 2014-11-24: qty 1

## 2014-11-24 MED ORDER — DEXAMETHASONE SODIUM PHOSPHATE 10 MG/ML IJ SOLN
10.0000 mg | Freq: Once | INTRAMUSCULAR | Status: AC
  Administered 2014-11-24: 10 mg via INTRAMUSCULAR
  Filled 2014-11-24: qty 1

## 2014-11-24 MED ORDER — HYDROCODONE-ACETAMINOPHEN 5-325 MG PO TABS
2.0000 | ORAL_TABLET | Freq: Once | ORAL | Status: AC
  Administered 2014-11-24: 2 via ORAL
  Filled 2014-11-24: qty 2

## 2014-11-24 NOTE — ED Provider Notes (Signed)
CSN: [id number]     Arrival date & time 11/24/14  1716 History   First MD Initiated Contact with Patient 11/24/14 1823     Chief Complaint  Patient presents with  . Back Pain     (Consider location/radiation/quality/duration/timing/severity/associated sxs/prior Treatment) HPI Comments: Pt is a 58 year old female presents to the emergency department with a complaint of back pain and right shoulder pain. The patient states that she sustained a significant injury to her back in 2013. The patient states she does a lot of lifting pushing and pulling because she has a sister who has cancer and has to be taken to the treatment center frequently during the week. Patient also states that whether causes a great deal of problems with her joints. She denies any new accidents. She denies any hot joints, or high fevers. She has been taking Percocet, but states this only minimally helps her pain. She presents to the emergency department tonight to get some assistance with her discomfort, as it is impeding her ability to do the things she feel she needs to do at home.  Patient is a 58 y.o. female presenting with back pain. The history is provided by the patient.  Back Pain Location:  Lumbar spine Associated symptoms: no abdominal pain, no chest pain and no dysuria     Past Medical History  Diagnosis Date  . CHF (congestive heart failure)     Normal left ventricular systolic function, ejection fraction 55-60% 07/2007 by cath.  . Diabetes mellitus   . Sleep apnea     CPAP use  . Pulmonary HTN     RHC 2008:  RA pressures 13/10 with a mean of 7 mmHg.  RV pressure 123456 with end diastolic pressure of 15 mmHg.  PA pressure 49/23 with a mean of 35 mmHg.  Pulmonary capillary wedge 17/50 with a mean of 14 mmHg. The PA saturation was 68%.  RA saturation was 71% and aortic saturation was 90%.  Cardiac output was 6.0 with a cardiac index of 2.80 by Fick.  Normal coronaries by cath 2008.  . Morbid obesity     s/p lap  band surgery  . Asthma   . Arthritis   . Hypertriglyceridemia   . COPD (chronic obstructive pulmonary disease)   . IBS (irritable bowel syndrome)   . Renal insufficiency   . Chronic back pain   . Chronic hip pain   . Sciatica of right side   . Uterine fibroid   . Internal hemorrhoids   . Hot flashes 10/06/2013  . Fibroid 10/13/2013  . Adnexal cyst 10/13/2013    Right simple cyst seen on [place]   Past Surgical History  Procedure Laterality Date  . Laparoscopic cholecystectomy  1985  . Laparoscopic gastric banding  12/12/2010  . Pilonidal cyst excision  1974  . Tumor excision  10/2011    left thigh  . Tumor excision  10/2011    on face  . Colonoscopy  10/18/10    SLF:2-mm sessile sigmoid polyp/diverticula  . Hemorrhoid banding  07/2012    Dr. Oneida Alar  . Cardiac catheterization  07/2007, 05/2012    normal coronary arteries   Family History  Problem Relation Age of Onset  . Breast cancer Paternal Aunt   . Diabetes Mother   . Heart failure Mother   . Breast cancer Sister    History  Substance Use Topics  . Smoking status: Former Smoker    Quit date: 08/08/1996  . Smokeless tobacco: Never Used  .  Alcohol Use: No   OB History    No data available     Review of Systems  Constitutional: Negative for activity change.       All ROS Neg except as noted in HPI  Eyes: Negative for photophobia and discharge.  Respiratory: Negative for cough, shortness of breath and wheezing.   Cardiovascular: Negative for chest pain and palpitations.  Gastrointestinal: Negative for abdominal pain and blood in stool.  Genitourinary: Negative for dysuria, frequency and hematuria.  Musculoskeletal: Positive for back pain and arthralgias. Negative for neck pain.  Skin: Negative.   Neurological: Negative for dizziness, seizures and speech difficulty.  Psychiatric/Behavioral: Negative for hallucinations and confusion.      Allergies  Aspirin; Ceftin; Ciprofloxacin; and Latex  Home Medications    Prior to Admission medications   Medication Sig Start Date End Date Taking? Authorizing Provider  albuterol (PROVENTIL HFA;VENTOLIN HFA) 108 (90 BASE) MCG/ACT inhaler Inhale 2 puffs into the lungs every 6 (six) hours as needed. FOR SHORTNESS OF BREATH/WHEEZING   Yes Historical Provider, MD  ALPRAZolam (XANAX) 0.5 MG tablet Take 0.5 mg by mouth 3 (three) times daily as needed. For anxiety/sleep 11/24/14  Yes Historical Provider, MD  amLODipine (NORVASC) 10 MG tablet Take 1 tablet by mouth every evening.  04/10/13  Yes Historical Provider, MD  beclomethasone (QVAR) 80 MCG/ACT inhaler Inhale 2 puffs into the lungs 2 (two) times daily as needed. FOR SHORTNESS OF BREATH/WHEEZING   Yes Historical Provider, MD  dicyclomine (BENTYL) 10 MG capsule Take 20 mg by mouth daily as needed for spasms (for IBS).   Yes Historical Provider, MD  fenofibrate 160 MG tablet Take 160 mg by mouth every morning.    Yes Historical Provider, MD  fluticasone (FLONASE) 50 MCG/ACT nasal spray Place 1-2 sprays into the nose daily.    Yes Historical Provider, MD  furosemide (LASIX) 40 MG tablet Take 40 mg by mouth daily as needed. 11/04/14  Yes Historical Provider, MD  ipratropium-albuterol (DUONEB) 0.5-2.5 (3) MG/3ML SOLN Take 3 mLs by nebulization every 4 (four) hours as needed. Shortness of Breath   Yes Historical Provider, MD  isosorbide mononitrate (IMDUR) 30 MG 24 hr tablet Take 30 mg by mouth every morning.  09/05/11  Yes Historical Provider, MD  loratadine (CLARITIN) 10 MG tablet Take 10 mg by mouth every morning.    Yes Historical Provider, MD  losartan (COZAAR) 100 MG tablet Take 100 mg by mouth every morning.    Yes Historical Provider, MD  metFORMIN (GLUCOPHAGE) 500 MG tablet Take 1 tablet by mouth 2 (two) times daily. 02/13/13  Yes Historical Provider, MD  nitroGLYCERIN (NITROSTAT) 0.4 MG SL tablet Place 0.4 mg under the tongue every 5 (five) minutes as needed. Chest Pains   Yes Historical Provider, MD   oxyCODONE-acetaminophen (PERCOCET) 10-325 MG per tablet Take 1 tablet by mouth every 6 (six) hours as needed for pain.  01/23/13  Yes Historical Provider, MD  potassium chloride SA (K-DUR,KLOR-CON) 20 MEQ tablet Take 20 mEq by mouth every morning.    Yes Historical Provider, MD  diphenhydrAMINE (BENADRYL) 25 MG tablet Take 1 tablet (25 mg total) by mouth every 6 (six) hours. Patient not taking: Reported on 11/24/2014 09/30/13   Artis Delay, MD  predniSONE (DELTASONE) 20 MG tablet Take 2 tablets (40 mg total) by mouth daily. Start 09/10/2013 Patient not taking: Reported on 11/24/2014 09/09/13   Francine Graven, DO   BP 144/68 mmHg  Pulse 91  Temp(Src) 97.8 F (36.6 C) (Oral)  Resp 14  Wt 280 lb (127.007 kg)  SpO2 100%  LMP 06/06/2013 Physical Exam  Constitutional: She is oriented to person, place, and time. She appears well-developed and well-nourished.  Non-toxic appearance.  HENT:  Head: Normocephalic.  Right Ear: Tympanic membrane and external ear normal.  Left Ear: Tympanic membrane and external ear normal.  Eyes: EOM and lids are normal. Pupils are equal, round, and reactive to light.  Neck: Normal range of motion. Neck supple. Carotid bruit is not present.  Cardiovascular: Normal rate, regular rhythm, normal heart sounds, intact distal pulses and normal pulses.   Pulmonary/Chest: Breath sounds normal. No respiratory distress.  Abdominal: Soft. Bowel sounds are normal. There is no tenderness. There is no guarding.  Musculoskeletal:       Right shoulder: She exhibits decreased range of motion, tenderness and spasm. She exhibits no deformity.       Lumbar back: She exhibits decreased range of motion, tenderness, pain and spasm.  Soreness of multiple joints  Lymphadenopathy:       Head (right side): No submandibular adenopathy present.       Head (left side): No submandibular adenopathy present.    She has no cervical adenopathy.  Neurological: She is alert and oriented to person,  place, and time. She has normal strength. No cranial nerve deficit or sensory deficit.  Skin: Skin is warm and dry.  Psychiatric: She has a normal mood and affect. Her speech is normal.  Nursing note and vitals reviewed.   ED Course  Procedures (including critical care time) Labs Review Labs Reviewed - No data to display  Imaging Review No results found.   EKG Interpretation None      MDM  Patient treated in the emergency department with intramuscular Decadron, oral Valium and Norco. No evidence for hot joints, no high fevers, no joint deformities appreciated. A prescription for Robaxin given to the patient. Patient already has prescription for Percocet. Patient is to follow-up with her primary physician, or return to the emergency department if any changes, problems, or concerns.    Final diagnoses:  Back pain, unspecified location  Pain, joint, multiple sites  DDD (degenerative disc disease), lumbar    *I have reviewed nursing notes, vital signs, and all appropriate lab and imaging results for this patient.[address] PA-C 11/24/14 Emory Knapp, MD 11/25/14 [phone]

## 2014-11-24 NOTE — ED Notes (Signed)
C/o lower back pain and right shoulder pain.  States its from an old injury in 2013.

## 2014-11-24 NOTE — ED Notes (Signed)
Pt stated ride in waiting room

## 2014-11-24 NOTE — Discharge Instructions (Signed)
Please continue your tylenol for mild pain, and percocet for more severe pain. Please add robaxin for spasm. See your MD for possible joint injection. Arthritis, Nonspecific Arthritis is inflammation of a joint. This usually means pain, redness, warmth or swelling are present. One or more joints may be involved. There are a number of types of arthritis. Your caregiver may not be able to tell what type of arthritis you have right away. CAUSES  The most common cause of arthritis is the wear and tear on the joint (osteoarthritis). This causes damage to the cartilage, which can break down over time. The knees, hips, back and neck are most often affected by this type of arthritis. Other types of arthritis and common causes of joint pain include:  Sprains and other injuries near the joint. Sometimes minor sprains and injuries cause pain and swelling that develop hours later.  Rheumatoid arthritis. This affects hands, feet and knees. It usually affects both sides of your body at the same time. It is often associated with chronic ailments, fever, weight loss and general weakness.  Crystal arthritis. Gout and pseudo gout can cause occasional acute severe pain, redness and swelling in the foot, ankle, or knee.  Infectious arthritis. Bacteria can get into a joint through a break in overlying skin. This can cause infection of the joint. Bacteria and viruses can also spread through the blood and affect your joints.  Drug, infectious and allergy reactions. Sometimes joints can become mildly painful and slightly swollen with these types of illnesses. SYMPTOMS   Pain is the main symptom.  Your joint or joints can also be red, swollen and warm or hot to the touch.  You may have a fever with certain types of arthritis, or even feel overall ill.  The joint with arthritis will hurt with movement. Stiffness is present with some types of arthritis. DIAGNOSIS  Your caregiver will suspect arthritis based on your  description of your symptoms and on your exam. Testing may be needed to find the type of arthritis:  Blood and sometimes urine tests.  X-ray tests and sometimes CT or MRI scans.  Removal of fluid from the joint (arthrocentesis) is done to check for bacteria, crystals or other causes. Your caregiver (or a specialist) will numb the area over the joint with a local anesthetic, and use a needle to remove joint fluid for examination. This procedure is only minimally uncomfortable.  Even with these tests, your caregiver may not be able to tell what kind of arthritis you have. Consultation with a specialist (rheumatologist) may be helpful. TREATMENT  Your caregiver will discuss with you treatment specific to your type of arthritis. If the specific type cannot be determined, then the following general recommendations may apply. Treatment of severe joint pain includes:  Rest.  Elevation.  Anti-inflammatory medication (for example, ibuprofen) may be prescribed. Avoiding activities that cause increased pain.  Only take over-the-counter or prescription medicines for pain and discomfort as recommended by your caregiver.  Cold packs over an inflamed joint may be used for 10 to 15 minutes every hour. Hot packs sometimes feel better, but do not use overnight. Do not use hot packs if you are diabetic without your caregiver's permission.  A cortisone shot into arthritic joints may help reduce pain and swelling.  Any acute arthritis that gets worse over the next 1 to 2 days needs to be looked at to be sure there is no joint infection. Long-term arthritis treatment involves modifying activities and lifestyle to reduce  joint stress jarring. This can include weight loss. Also, exercise is needed to nourish the joint cartilage and remove waste. This helps keep the muscles around the joint strong. HOME CARE INSTRUCTIONS   Do not take aspirin to relieve pain if gout is suspected. This elevates uric acid  levels.  Only take over-the-counter or prescription medicines for pain, discomfort or fever as directed by your caregiver.  Rest the joint as much as possible.  If your joint is swollen, keep it elevated.  Use crutches if the painful joint is in your leg.  Drinking plenty of fluids may help for certain types of arthritis.  Follow your caregiver's dietary instructions.  Try low-impact exercise such as:  Swimming.  Water aerobics.  Biking.  Walking.  Morning stiffness is often relieved by a warm shower.  Put your joints through regular range-of-motion. SEEK MEDICAL CARE IF:   You do not feel better in 24 hours or are getting worse.  You have side effects to medications, or are not getting better with treatment. SEEK IMMEDIATE MEDICAL CARE IF:   You have a fever.  You develop severe joint pain, swelling or redness.  Many joints are involved and become painful and swollen.  There is severe back pain and/or leg weakness.  You have loss of bowel or bladder control. Document Released: 01/24/2005 Document Revised: 03/10/2012 Document Reviewed: 02/09/2009 [facility] Patient Information 2015 [place], [place]. This information is not intended to replace advice given to you by your health care provider. Make sure you discuss any questions you have with your health care provider.  Degenerative Disk Disease Degenerative disk disease is a condition caused by the changes that occur in the cushions of the backbone (spinal disks) as you grow older. Spinal disks are soft and compressible disks located between the bones of the spine (vertebrae). They act like shock absorbers. Degenerative disk disease can affect the whole spine. However, the neck and lower back are most commonly affected. Many changes can occur in the spinal disks with aging, such as:  The spinal disks may dry and shrink.  Small tears may occur in the tough, outer covering of the disk (annulus).  The disk space may  become smaller due to loss of water.  Abnormal growths in the bone (spurs) may occur. This can put pressure on the nerve roots exiting the spinal canal, causing pain.  The spinal canal may become narrowed. CAUSES  Degenerative disk disease is a condition caused by the changes that occur in the spinal disks with aging. The exact cause is not known, but there is a genetic basis for many patients. Degenerative changes can occur due to loss of fluid in the disk. This makes the disk thinner and reduces the space between the backbones. Small cracks can develop in the outer layer of the disk. This can lead to the breakdown of the disk. You are more likely to get degenerative disk disease if you are overweight. Smoking cigarettes and doing heavy work such as weightlifting can also increase your risk of this condition. Degenerative changes can start after a sudden injury. Growth of bone spurs can compress the nerve roots and cause pain.  SYMPTOMS  The symptoms vary from person to person. Some people may have no pain, while others have severe pain. The pain may be so severe that it can limit your activities. The location of the pain depends on the part of your backbone that is affected. You will have neck or arm pain if a disk  in the neck area is affected. You will have pain in your back, buttocks, or legs if a disk in the lower back is affected. The pain becomes worse while bending, reaching up, or with twisting movements. The pain may start gradually and then get worse as time passes. It may also start after a major or minor injury. You may feel numbness or tingling in the arms or legs.  DIAGNOSIS  Your caregiver will ask you about your symptoms and about activities or habits that may cause the pain. He or she may also ask about any injuries, diseases, or treatments you have had earlier. Your caregiver will examine you to check for the range of movement that is possible in the affected area, to check for strength  in your extremities, and to check for sensation in the areas of the arms and legs supplied by different nerve roots. An X-ray of the spine may be taken. Your caregiver may suggest other imaging tests, such as magnetic resonance imaging (MRI), if needed.  TREATMENT  Treatment includes rest, modifying your activities, and applying ice and heat. Your caregiver may prescribe medicines to reduce your pain and may ask you to do some exercises to strengthen your back. In some cases, you may need surgery. You and your caregiver will decide on the treatment that is best for you. HOME CARE INSTRUCTIONS   Follow proper lifting and walking techniques as advised by your caregiver.  Maintain good posture.  Exercise regularly as advised.  Perform relaxation exercises.  Change your sitting, standing, and sleeping habits as advised. Change positions frequently.  Lose weight as advised.  Stop smoking if you smoke.  Wear supportive footwear. SEEK MEDICAL CARE IF:  Your pain does not go away within 1 to 4 weeks. SEEK IMMEDIATE MEDICAL CARE IF:   Your pain is severe.  You notice weakness in your arms, hands, or legs.  You begin to lose control of your bladder or bowel movements. MAKE SURE YOU:   Understand these instructions.  Will watch your condition.  Will get help right away if you are not doing well or get worse. Document Released: 10/14/2007 Document Revised: 03/10/2012 Document Reviewed: 04/20/2014 [facility] Patient Information 2015 [place], [place]. This information is not intended to replace advice given to you by your health care provider. Make sure you discuss any questions you have with your health care provider.

## 2014-12-09 ENCOUNTER — Encounter (HOSPITAL_COMMUNITY): Payer: Self-pay | Admitting: Internal Medicine

## 2015-01-03 ENCOUNTER — Other Ambulatory Visit (HOSPITAL_COMMUNITY): Payer: Self-pay | Admitting: Internal Medicine

## 2015-01-03 DIAGNOSIS — M5126 Other intervertebral disc displacement, lumbar region: Secondary | ICD-10-CM

## 2015-01-03 DIAGNOSIS — M5410 Radiculopathy, site unspecified: Secondary | ICD-10-CM

## 2015-01-06 ENCOUNTER — Ambulatory Visit (HOSPITAL_COMMUNITY)
Admission: RE | Admit: 2015-01-06 | Discharge: 2015-01-06 | Disposition: A | Discharge status: Home / Self Care | Payer: Commercial Managed Care - HMO | Source: Ambulatory Visit | Attending: Internal Medicine | Admitting: Internal Medicine

## 2015-01-06 DIAGNOSIS — M545 Low back pain: Secondary | ICD-10-CM | POA: Insufficient documentation

## 2015-01-06 DIAGNOSIS — R200 Anesthesia of skin: Secondary | ICD-10-CM | POA: Diagnosis not present

## 2015-01-06 DIAGNOSIS — R531 Weakness: Secondary | ICD-10-CM | POA: Insufficient documentation

## 2015-01-06 DIAGNOSIS — M5126 Other intervertebral disc displacement, lumbar region: Secondary | ICD-10-CM

## 2015-01-06 DIAGNOSIS — M79605 Pain in left leg: Secondary | ICD-10-CM | POA: Insufficient documentation

## 2015-01-06 DIAGNOSIS — M79604 Pain in right leg: Secondary | ICD-10-CM | POA: Insufficient documentation

## 2015-01-06 DIAGNOSIS — M5136 Other intervertebral disc degeneration, lumbar region: Secondary | ICD-10-CM | POA: Diagnosis not present

## 2015-01-06 DIAGNOSIS — M5410 Radiculopathy, site unspecified: Secondary | ICD-10-CM

## 2015-01-17 DIAGNOSIS — R21 Rash and other nonspecific skin eruption: Secondary | ICD-10-CM | POA: Diagnosis not present

## 2015-01-17 DIAGNOSIS — M549 Dorsalgia, unspecified: Secondary | ICD-10-CM | POA: Diagnosis not present

## 2015-01-17 DIAGNOSIS — Z113 Encounter for screening for infections with a predominantly sexual mode of transmission: Secondary | ICD-10-CM | POA: Diagnosis not present

## 2015-01-17 DIAGNOSIS — Z6841 Body Mass Index (BMI) 40.0 and over, adult: Secondary | ICD-10-CM | POA: Diagnosis not present

## 2015-01-18 ENCOUNTER — Ambulatory Visit (HOSPITAL_COMMUNITY)
Admission: RE | Admit: 2015-01-18 | Discharge: 2015-01-18 | Disposition: A | Discharge status: Home / Self Care | Payer: Medicare HMO | Source: Ambulatory Visit | Attending: Internal Medicine | Admitting: Internal Medicine

## 2015-01-18 DIAGNOSIS — E781 Pure hyperglyceridemia: Secondary | ICD-10-CM | POA: Diagnosis not present

## 2015-01-18 DIAGNOSIS — Z9884 Bariatric surgery status: Secondary | ICD-10-CM | POA: Insufficient documentation

## 2015-01-18 DIAGNOSIS — R0789 Other chest pain: Secondary | ICD-10-CM | POA: Diagnosis not present

## 2015-01-18 DIAGNOSIS — Z79899 Other long term (current) drug therapy: Secondary | ICD-10-CM | POA: Diagnosis not present

## 2015-01-18 DIAGNOSIS — J45909 Unspecified asthma, uncomplicated: Secondary | ICD-10-CM | POA: Insufficient documentation

## 2015-01-18 DIAGNOSIS — I509 Heart failure, unspecified: Secondary | ICD-10-CM | POA: Insufficient documentation

## 2015-01-18 DIAGNOSIS — Z7952 Long term (current) use of systemic steroids: Secondary | ICD-10-CM | POA: Insufficient documentation

## 2015-01-18 DIAGNOSIS — R079 Chest pain, unspecified: Secondary | ICD-10-CM | POA: Insufficient documentation

## 2015-01-18 DIAGNOSIS — I5032 Chronic diastolic (congestive) heart failure: Secondary | ICD-10-CM

## 2015-01-18 DIAGNOSIS — E119 Type 2 diabetes mellitus without complications: Secondary | ICD-10-CM | POA: Diagnosis not present

## 2015-01-18 DIAGNOSIS — Z886 Allergy status to analgesic agent status: Secondary | ICD-10-CM | POA: Insufficient documentation

## 2015-01-18 DIAGNOSIS — J441 Chronic obstructive pulmonary disease with (acute) exacerbation: Secondary | ICD-10-CM | POA: Diagnosis not present

## 2015-01-18 DIAGNOSIS — G4733 Obstructive sleep apnea (adult) (pediatric): Secondary | ICD-10-CM | POA: Diagnosis not present

## 2015-01-18 DIAGNOSIS — Z888 Allergy status to other drugs, medicaments and biological substances status: Secondary | ICD-10-CM | POA: Insufficient documentation

## 2015-01-18 DIAGNOSIS — R0609 Other forms of dyspnea: Secondary | ICD-10-CM | POA: Insufficient documentation

## 2015-01-18 DIAGNOSIS — I270 Primary pulmonary hypertension: Secondary | ICD-10-CM | POA: Diagnosis not present

## 2015-01-18 DIAGNOSIS — Z91040 Latex allergy status: Secondary | ICD-10-CM | POA: Insufficient documentation

## 2015-01-18 DIAGNOSIS — I272 Other secondary pulmonary hypertension: Secondary | ICD-10-CM | POA: Insufficient documentation

## 2015-01-18 NOTE — Progress Notes (Signed)
Patient ID: Sara Tran, female   DOB: 31-Jan-1956, 59 y.o.   MRN: [id number] Referring Physician: Dr. [name] Primary Care: Dr. Hilma Favors Primary Cardiologist: none Nephrologist: Dr. Hinda Lenis  HPI:  Ms. Dethomas is a 59 y.o. African American female with history of mild pulmonary hypertension, diabetes, HF, COPD, sleep apnea with CPAP, elevated triglycerides and renal insufficiency.   Obesity s/p lap band in 2011.  Also history of IBS and arthritis.  She has been referred for by Dr. [name] for further work up of her dyspnea and pulmonary hypertension.   VQ scan 2008: normal  RHC 2008:  RA pressures 13/10 with a mean of 7 mmHg.  RV pressure 123456 with end diastolic pressure of 15 mmHg.  PA pressure 49/23 with a mean of 35 mmHg.  Pulmonary capillary wedge 17/50 with a mean of 14 mmHg. The PA saturation was 68%.  RA saturation was 71% and aortic saturation was 90%.  Cardiac output was 6.0 with a cardiac index of 2.80 by Fick.  Normal Coronaries per [facility] 2008.  Dr. [name] performed PFTs on 03/13/12 which were normal. DLCO is minimally reduced, but is corrected to some extent when considerations of volume are made.  FVC 1.91 (81%), FEV1 (88%), FEV1/FVC 87 (107%), FEF 25-75% 2.09 (103%), DLCO 75%  RHC 6/13 RA = 9  RV = 42/6/10  PA = 40/17 (24)  PCW = 12  Fick cardiac output/index = 6.0/2.8  PVR = 2.0 Woods units  FA sat = 99%  PA sat = 73%, 76%  SVC sat = 81%  Ao Pressure: 151/71 (103)  LV Pressure: 150/8/14  There was no signficant gradient across the aortic valve on pullback.  LHC 6/13: normal cors  Echo 6/13: LVEF 55-60%.  Trivial TR. Normal RV.    She returns for follow up today.  She was last seen in 2013.  Symptoms are stable.  She continues to have wheezing associated with her diagnosis of asthma.  She gets chest tightness when she wheezes.  This is worse in cold weather.  She is short of breath after walking up a flight of steps.  She is limited by low back pain and hip pain. She is  able to get some exercise by walking in a pool.    Review of Systems: All pertinent positives and negatives as in HPI, otherwise negative.    Past Medical History  Diagnosis Date  . CHF (congestive heart failure)     Normal left ventricular systolic function, ejection fraction 55-60% 07/2007 by cath.  . Diabetes mellitus   . Sleep apnea     CPAP use  . Pulmonary HTN     RHC 2008:  RA pressures 13/10 with a mean of 7 mmHg.  RV pressure 123456 with end diastolic pressure of 15 mmHg.  PA pressure 49/23 with a mean of 35 mmHg.  Pulmonary capillary wedge 17/50 with a mean of 14 mmHg. The PA saturation was 68%.  RA saturation was 71% and aortic saturation was 90%.  Cardiac output was 6.0 with a cardiac index of 2.80 by Fick.  Normal coronaries by cath 2008.  . Morbid obesity     s/p lap band surgery  . Asthma   . Arthritis   . Hypertriglyceridemia   . COPD (chronic obstructive pulmonary disease)   . IBS (irritable bowel syndrome)   . Renal insufficiency   . Chronic back pain   . Chronic hip pain   . Sciatica of right side   . Uterine  fibroid   . Internal hemorrhoids   . Hot flashes 10/06/2013  . Fibroid 10/13/2013  . Adnexal cyst 10/13/2013    Right simple cyst seen on [place]    Current Outpatient Prescriptions  Medication Sig Dispense Refill  . albuterol (PROVENTIL HFA;VENTOLIN HFA) 108 (90 BASE) MCG/ACT inhaler Inhale 2 puffs into the lungs every 6 (six) hours as needed. FOR SHORTNESS OF BREATH/WHEEZING    . ALPRAZolam (XANAX) 0.5 MG tablet Take 0.5 mg by mouth 3 (three) times daily as needed. For anxiety/sleep    . amLODipine (NORVASC) 10 MG tablet Take 1 tablet by mouth every evening.     . beclomethasone (QVAR) 80 MCG/ACT inhaler Inhale 2 puffs into the lungs 2 (two) times daily as needed. FOR SHORTNESS OF BREATH/WHEEZING    . dicyclomine (BENTYL) 10 MG capsule Take 20 mg by mouth daily as needed for spasms (for IBS).    Marland Kitchen diphenhydrAMINE (BENADRYL) 25 MG tablet Take 25 mg by mouth  every 6 (six) hours.    . fenofibrate 160 MG tablet Take 160 mg by mouth every morning.     . fluticasone (FLONASE) 50 MCG/ACT nasal spray Place 1-2 sprays into the nose daily.     . furosemide (LASIX) 40 MG tablet Take 40 mg by mouth daily as needed.  0  . ipratropium-albuterol (DUONEB) 0.5-2.5 (3) MG/3ML SOLN Take 3 mLs by nebulization every 4 (four) hours as needed. Shortness of Breath    . isosorbide mononitrate (IMDUR) 30 MG 24 hr tablet Take 30 mg by mouth every morning.     . loratadine (CLARITIN) 10 MG tablet Take 10 mg by mouth every morning.     Marland Kitchen losartan (COZAAR) 100 MG tablet Take 100 mg by mouth every morning.     . metFORMIN (GLUCOPHAGE) 500 MG tablet Take 1 tablet by mouth 2 (two) times daily.    . nitroGLYCERIN (NITROSTAT) 0.4 MG SL tablet Place 0.4 mg under the tongue every 5 (five) minutes as needed. Chest Pains    . oxyCODONE-acetaminophen (PERCOCET) 10-325 MG per tablet Take 1 tablet by mouth every 6 (six) hours as needed for pain.     . potassium chloride SA (K-DUR,KLOR-CON) 20 MEQ tablet Take 20 mEq by mouth every morning.     Marland Kitchen tiZANidine (ZANAFLEX) 4 MG tablet Take 4 mg by mouth every 8 (eight) hours as needed for muscle spasms.    . predniSONE (DELTASONE) 20 MG tablet Take 2 tablets (40 mg total) by mouth daily. Start 09/10/2013 (Patient not taking: Reported on 11/24/2014) 10 tablet 0   No current facility-administered medications for this encounter.    Allergies  Allergen Reactions  . Aspirin Other (See Comments)    Rectal bleeding  . Ceftin [Cefuroxime Axetil] Swelling    Swollen tongue, "made me crazy"  . Ciprofloxacin Cough    Coughed up blood  . Latex Itching and Rash    PHYSICAL EXAM: Filed Vitals:   01/18/15 1415  BP: 156/78  Pulse: 85  Weight: 288 lb 8 oz (130.863 kg)  SpO2: 98%    General:  Obese, Well appearing. No respiratory difficulty, mild kyphosis  HEENT: normal Neck: supple. JVP very difficult to assess due to thick neck. Carotids 2+  bilat; no bruits. No lymphadenopathy or thryomegaly appreciated. Cor: Distant Regular rate & rhythm. No rubs, gallops or murmurs. Lungs: clear Abdomen: obese, soft, nontender, nondistended. No hepatosplenomegaly. No bruits or masses. Good bowel sounds. Extremities: no cyanosis, clubbing, rash, edema Neuro: alert & oriented x 3,  cranial nerves grossly intact. moves all 4 extremities w/o difficulty. Affect pleasant.   ASSESSMENT & PLAN: 1. Chest pain: No coronary disease on cath in 6/13.  She has a pattern of atypical chest pain that occurs when she wheezes.  This has been going on for a long time, and I think it is likely related to her asthma.   2. Exertional dyspnea: Stable. I suspect that this is mainly related to obesity and deconditioning.  It is hard for her to exercise much given back and hip pain.  3. Pulmonary hypertension: Patient had mild pulmonary hypertension on RHC in 6/13.  Echo at that time showed normal RV.  Suspect mild PAH was mainly due to OSA.  - Continue CPAP use.  - I will arrange for repeat echo to make sure RV remains normal and to reassess PA pressure noninvasively.   Loralie Champagne 01/18/2015

## 2015-01-18 NOTE — Patient Instructions (Signed)
Your physician has requested that you have an echocardiogram. Echocardiography is a painless test that uses sound waves to create images of your heart. It provides your doctor with information about the size and shape of your heart and how well your heart's chambers and valves are working. This procedure takes approximately one hour. There are no restrictions for this procedure.  Wednesday 01/19/2015 @ 3:00 pm [place]  Your physician recommends that you schedule a follow-up appointment in: 1 year

## 2015-01-19 ENCOUNTER — Ambulatory Visit (HOSPITAL_COMMUNITY)
Admission: RE | Admit: 2015-01-19 | Discharge: 2015-01-19 | Disposition: A | Discharge status: Home / Self Care | Payer: Medicare HMO | Source: Ambulatory Visit | Attending: Cardiology | Admitting: Cardiology

## 2015-01-19 DIAGNOSIS — I5032 Chronic diastolic (congestive) heart failure: Secondary | ICD-10-CM

## 2015-01-19 DIAGNOSIS — I272 Other secondary pulmonary hypertension: Secondary | ICD-10-CM | POA: Insufficient documentation

## 2015-01-19 DIAGNOSIS — E785 Hyperlipidemia, unspecified: Secondary | ICD-10-CM | POA: Diagnosis not present

## 2015-01-19 DIAGNOSIS — E119 Type 2 diabetes mellitus without complications: Secondary | ICD-10-CM | POA: Insufficient documentation

## 2015-01-19 DIAGNOSIS — I517 Cardiomegaly: Secondary | ICD-10-CM | POA: Diagnosis not present

## 2015-01-19 NOTE — Progress Notes (Signed)
  Echocardiogram 2D Echocardiogram has been performed.  [place], [place] 01/19/2015, 3:45 PM

## 2015-02-09 DIAGNOSIS — M47816 Spondylosis without myelopathy or radiculopathy, lumbar region: Secondary | ICD-10-CM | POA: Diagnosis not present

## 2015-02-09 DIAGNOSIS — Z6841 Body Mass Index (BMI) 40.0 and over, adult: Secondary | ICD-10-CM | POA: Diagnosis not present

## 2015-05-06 DIAGNOSIS — J309 Allergic rhinitis, unspecified: Secondary | ICD-10-CM | POA: Diagnosis not present

## 2015-05-06 DIAGNOSIS — J45909 Unspecified asthma, uncomplicated: Secondary | ICD-10-CM | POA: Diagnosis not present

## 2015-05-06 DIAGNOSIS — J200 Acute bronchitis due to Mycoplasma pneumoniae: Secondary | ICD-10-CM | POA: Diagnosis not present

## 2015-05-24 DIAGNOSIS — E119 Type 2 diabetes mellitus without complications: Secondary | ICD-10-CM | POA: Diagnosis not present

## 2015-05-24 DIAGNOSIS — G894 Chronic pain syndrome: Secondary | ICD-10-CM | POA: Diagnosis not present

## 2015-05-24 DIAGNOSIS — Z6841 Body Mass Index (BMI) 40.0 and over, adult: Secondary | ICD-10-CM | POA: Diagnosis not present

## 2015-05-24 DIAGNOSIS — G4733 Obstructive sleep apnea (adult) (pediatric): Secondary | ICD-10-CM | POA: Diagnosis not present

## 2015-05-24 DIAGNOSIS — I10 Essential (primary) hypertension: Secondary | ICD-10-CM | POA: Diagnosis not present

## 2015-05-24 DIAGNOSIS — L309 Dermatitis, unspecified: Secondary | ICD-10-CM | POA: Diagnosis not present

## 2015-05-25 ENCOUNTER — Telehealth: Payer: Self-pay | Admitting: Physical Medicine & Rehabilitation

## 2015-05-25 NOTE — Telephone Encounter (Signed)
Patient having hip and back pain and would like to make an appointment to come back to see Dr. Naaman Plummer.  Please advise if you would like for [place] to set up a follow up, she has not seen you since 2014.

## 2015-05-25 NOTE — Telephone Encounter (Signed)
Unless there is some reason we shouldn't be seeing her, it is fine for her to come back to see me for re-eval

## 2015-06-06 DIAGNOSIS — N390 Urinary tract infection, site not specified: Secondary | ICD-10-CM | POA: Diagnosis not present

## 2015-06-06 DIAGNOSIS — I272 Other secondary pulmonary hypertension: Secondary | ICD-10-CM | POA: Diagnosis not present

## 2015-06-06 DIAGNOSIS — G4730 Sleep apnea, unspecified: Secondary | ICD-10-CM | POA: Diagnosis not present

## 2015-06-06 DIAGNOSIS — I10 Essential (primary) hypertension: Secondary | ICD-10-CM | POA: Diagnosis not present

## 2015-06-06 DIAGNOSIS — J45909 Unspecified asthma, uncomplicated: Secondary | ICD-10-CM | POA: Diagnosis not present

## 2015-06-22 ENCOUNTER — Other Ambulatory Visit (HOSPITAL_COMMUNITY)
Admission: RE | Admit: 2015-06-22 | Discharge: 2015-06-22 | Disposition: A | Discharge status: Home / Self Care | Payer: Commercial Managed Care - HMO | Source: Ambulatory Visit | Attending: Adult Health | Admitting: Adult Health

## 2015-06-22 ENCOUNTER — Ambulatory Visit (INDEPENDENT_AMBULATORY_CARE_PROVIDER_SITE_OTHER): Payer: Commercial Managed Care - HMO | Admitting: Adult Health

## 2015-06-22 ENCOUNTER — Encounter: Payer: Self-pay | Admitting: Adult Health

## 2015-06-22 VITALS — BP 140/60 | HR 96 | Ht 59.0 in | Wt 286.5 lb

## 2015-06-22 DIAGNOSIS — Z124 Encounter for screening for malignant neoplasm of cervix: Secondary | ICD-10-CM | POA: Insufficient documentation

## 2015-06-22 DIAGNOSIS — Z1151 Encounter for screening for human papillomavirus (HPV): Secondary | ICD-10-CM | POA: Insufficient documentation

## 2015-06-22 DIAGNOSIS — Z139 Encounter for screening, unspecified: Secondary | ICD-10-CM

## 2015-06-22 LAB — HEMOCCULT GUIAC POC 1CARD (OFFICE): Fecal Occult Blood, POC: NEGATIVE
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
## 2015-06-22 NOTE — Patient Instructions (Addendum)
Pap in 2 years Mammogram yearly F/U with PCP Genital Herpes Genital herpes is a sexually transmitted disease. This means that it is a disease passed by having sex with an infected person. There is no cure for genital herpes. The time between attacks can be months to years. The virus may live in a person but produce no problems (symptoms). This infection can be passed to a baby as it travels down the birth canal (vagina). In a newborn, this can cause central nervous system damage, eye damage, or even death. The virus that causes genital herpes is usually HSV-2 virus. The virus that causes oral herpes is usually HSV-1. The diagnosis (learning what is wrong) is made through culture results. SYMPTOMS  Usually symptoms of pain and itching begin a few days to a week after contact. It first appears as small blisters that progress to small painful ulcers which then scab over and heal after several days. It affects the outer genitalia, birth canal, cervix, penis, anal area, buttocks, and thighs. HOME CARE INSTRUCTIONS   Keep ulcerated areas dry and clean.  Take medications as directed. Antiviral medications can speed up healing. They will not prevent recurrences or cure this infection. These medications can also be taken for suppression if there are frequent recurrences.  While the infection is active, it is contagious. Avoid all sexual contact during active infections.  Condoms may help prevent spread of the herpes virus.  Practice safe sex.  Wash your hands thoroughly after touching the genital area.  Avoid touching your eyes after touching your genital area.  Inform your caregiver if you have had genital herpes and become pregnant. It is your responsibility to insure a safe outcome for your baby in this pregnancy.  Only take over-the-counter or prescription medicines for pain, discomfort, or fever as directed by your caregiver. SEEK MEDICAL CARE IF:   You have a recurrence of this  infection.  You do not respond to medications and are not improving.  You have new sources of pain or discharge which have changed from the original infection.  You have an oral temperature above 102 F (38.9 C).  You develop abdominal pain.  You develop eye pain or signs of eye infection. Document Released: 12/14/2000 Document Revised: 03/10/2012 Document Reviewed: 01/04/2010 [facility] Patient Information 2015 [place], [place]. This information is not intended to replace advice given to you by your health care provider. Make sure you discuss any questions you have with your health care provider. use condoms

## 2015-06-24 LAB — CYTOLOGY - PAP

## 2015-06-30 DIAGNOSIS — H5210 Myopia, unspecified eye: Secondary | ICD-10-CM | POA: Diagnosis not present

## 2015-06-30 DIAGNOSIS — H524 Presbyopia: Secondary | ICD-10-CM | POA: Diagnosis not present

## 2015-07-11 DIAGNOSIS — R197 Diarrhea, unspecified: Secondary | ICD-10-CM | POA: Diagnosis not present

## 2015-07-25 ENCOUNTER — Encounter: Payer: Self-pay | Admitting: Physical Medicine & Rehabilitation

## 2015-07-25 ENCOUNTER — Encounter
Payer: Commercial Managed Care - HMO | Attending: Physical Medicine & Rehabilitation | Admitting: Physical Medicine & Rehabilitation

## 2015-07-25 DIAGNOSIS — K589 Irritable bowel syndrome without diarrhea: Secondary | ICD-10-CM | POA: Insufficient documentation

## 2015-07-25 DIAGNOSIS — M545 Low back pain: Secondary | ICD-10-CM | POA: Diagnosis not present

## 2015-07-25 DIAGNOSIS — M4316 Spondylolisthesis, lumbar region: Secondary | ICD-10-CM | POA: Diagnosis not present

## 2015-07-25 DIAGNOSIS — Z87891 Personal history of nicotine dependence: Secondary | ICD-10-CM | POA: Diagnosis not present

## 2015-07-25 DIAGNOSIS — M7061 Trochanteric bursitis, right hip: Secondary | ICD-10-CM

## 2015-07-25 DIAGNOSIS — J449 Chronic obstructive pulmonary disease, unspecified: Secondary | ICD-10-CM | POA: Diagnosis not present

## 2015-07-25 DIAGNOSIS — E119 Type 2 diabetes mellitus without complications: Secondary | ICD-10-CM | POA: Insufficient documentation

## 2015-07-25 DIAGNOSIS — R209 Unspecified disturbances of skin sensation: Secondary | ICD-10-CM | POA: Insufficient documentation

## 2015-07-25 DIAGNOSIS — M47816 Spondylosis without myelopathy or radiculopathy, lumbar region: Secondary | ICD-10-CM

## 2015-07-25 DIAGNOSIS — M25551 Pain in right hip: Secondary | ICD-10-CM | POA: Diagnosis present

## 2015-07-25 DIAGNOSIS — M7062 Trochanteric bursitis, left hip: Secondary | ICD-10-CM

## 2015-07-25 DIAGNOSIS — M5416 Radiculopathy, lumbar region: Secondary | ICD-10-CM

## 2015-07-25 DIAGNOSIS — M7060 Trochanteric bursitis, unspecified hip: Secondary | ICD-10-CM | POA: Insufficient documentation

## 2015-07-25 DIAGNOSIS — M25552 Pain in left hip: Secondary | ICD-10-CM | POA: Diagnosis present

## 2015-07-25 MED ORDER — GABAPENTIN 100 MG PO CAPS: 100.0000 mg | ORAL_CAPSULE | Freq: Three times a day (TID) | ORAL | Status: DC

## 2015-07-25 NOTE — Patient Instructions (Addendum)
REMEMBER TO STRETCH AND EXERCISE DAILY!  PLEASE CALL ME WITH ANY PROBLEMS OR QUESTIONS [id number]).  HAVE A GOOD DAY!   GABAPENTIN: 100MG  AT NIGHT FOR 4 DAYS, THEN 100MG  TWICE DAILY FOR 4 DAYS, THEN 100MG  THREE X DAILY
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
## 2015-08-02 DIAGNOSIS — I272 Other secondary pulmonary hypertension: Secondary | ICD-10-CM | POA: Diagnosis not present

## 2015-08-02 DIAGNOSIS — G4730 Sleep apnea, unspecified: Secondary | ICD-10-CM | POA: Diagnosis not present

## 2015-08-02 DIAGNOSIS — R197 Diarrhea, unspecified: Secondary | ICD-10-CM | POA: Diagnosis not present

## 2015-08-02 DIAGNOSIS — J45909 Unspecified asthma, uncomplicated: Secondary | ICD-10-CM | POA: Diagnosis not present

## 2015-08-16 DIAGNOSIS — Z0000 Encounter for general adult medical examination without abnormal findings: Secondary | ICD-10-CM | POA: Diagnosis not present

## 2015-08-16 DIAGNOSIS — Z1389 Encounter for screening for other disorder: Secondary | ICD-10-CM | POA: Diagnosis not present

## 2015-08-16 DIAGNOSIS — Z113 Encounter for screening for infections with a predominantly sexual mode of transmission: Secondary | ICD-10-CM | POA: Diagnosis not present

## 2015-08-16 DIAGNOSIS — Z6841 Body Mass Index (BMI) 40.0 and over, adult: Secondary | ICD-10-CM | POA: Diagnosis not present

## 2015-08-23 ENCOUNTER — Other Ambulatory Visit (HOSPITAL_COMMUNITY): Payer: Self-pay | Admitting: Family Medicine

## 2015-08-23 DIAGNOSIS — Z1231 Encounter for screening mammogram for malignant neoplasm of breast: Secondary | ICD-10-CM

## 2015-08-26 ENCOUNTER — Ambulatory Visit (HOSPITAL_COMMUNITY): Payer: Commercial Managed Care - HMO

## 2015-08-29 ENCOUNTER — Ambulatory Visit (HOSPITAL_COMMUNITY): Payer: Commercial Managed Care - HMO

## 2015-09-20 ENCOUNTER — Telehealth: Payer: Self-pay | Admitting: *Deleted

## 2015-09-20 ENCOUNTER — Other Ambulatory Visit: Payer: Self-pay | Admitting: Gastroenterology

## 2015-09-20 NOTE — Telephone Encounter (Signed)
Sara Tran called and she has messed up and her appt was set on 10/12/15 which is a Wednedsday and that doesn't work for her.  Can you please give her a call and see if the Tuesday appts are still available, please?  thx

## 2015-09-21 ENCOUNTER — Encounter: Payer: Commercial Managed Care - HMO | Admitting: Physical Medicine & Rehabilitation

## 2015-09-21 NOTE — Telephone Encounter (Signed)
Please notify the patient she has not been seen in over 2 years. I will refills meds, as requested by her pharmacy, for a couple months to last until she can be seen. Please schedule follow-up appointment.

## 2015-09-21 NOTE — Telephone Encounter (Signed)
PATIENT HAS APPT 11/17/2015

## 2015-10-11 ENCOUNTER — Encounter
Payer: Commercial Managed Care - HMO | Attending: Physical Medicine & Rehabilitation | Admitting: Physical Medicine & Rehabilitation

## 2015-10-11 ENCOUNTER — Encounter: Payer: Self-pay | Admitting: Physical Medicine & Rehabilitation

## 2015-10-11 VITALS — BP 137/69 | HR 82

## 2015-10-11 DIAGNOSIS — M5416 Radiculopathy, lumbar region: Secondary | ICD-10-CM | POA: Diagnosis not present

## 2015-10-11 DIAGNOSIS — M7062 Trochanteric bursitis, left hip: Secondary | ICD-10-CM | POA: Diagnosis not present

## 2015-10-11 DIAGNOSIS — R209 Unspecified disturbances of skin sensation: Secondary | ICD-10-CM | POA: Diagnosis not present

## 2015-10-11 DIAGNOSIS — M7060 Trochanteric bursitis, unspecified hip: Secondary | ICD-10-CM | POA: Insufficient documentation

## 2015-10-11 DIAGNOSIS — M4316 Spondylolisthesis, lumbar region: Secondary | ICD-10-CM | POA: Diagnosis not present

## 2015-10-11 DIAGNOSIS — M7061 Trochanteric bursitis, right hip: Secondary | ICD-10-CM | POA: Diagnosis not present

## 2015-10-11 DIAGNOSIS — Z87891 Personal history of nicotine dependence: Secondary | ICD-10-CM | POA: Diagnosis not present

## 2015-10-11 DIAGNOSIS — M47816 Spondylosis without myelopathy or radiculopathy, lumbar region: Secondary | ICD-10-CM

## 2015-10-11 DIAGNOSIS — J449 Chronic obstructive pulmonary disease, unspecified: Secondary | ICD-10-CM | POA: Diagnosis not present

## 2015-10-11 DIAGNOSIS — M25551 Pain in right hip: Secondary | ICD-10-CM | POA: Diagnosis present

## 2015-10-11 DIAGNOSIS — K589 Irritable bowel syndrome without diarrhea: Secondary | ICD-10-CM | POA: Insufficient documentation

## 2015-10-11 DIAGNOSIS — M25552 Pain in left hip: Secondary | ICD-10-CM | POA: Diagnosis present

## 2015-10-11 DIAGNOSIS — M545 Low back pain: Secondary | ICD-10-CM | POA: Diagnosis not present

## 2015-10-11 DIAGNOSIS — G894 Chronic pain syndrome: Secondary | ICD-10-CM | POA: Diagnosis not present

## 2015-10-11 DIAGNOSIS — E119 Type 2 diabetes mellitus without complications: Secondary | ICD-10-CM | POA: Insufficient documentation

## 2015-10-11 NOTE — Progress Notes (Signed)
Subjective:    Patient ID: Sara Tran, female    DOB: [birth year], 59 y.o.   MRN: [id number]  HPI   Sara Tran is here in follow up of her chronic low back and hip pain. She has had increased pain over the last couple months. The greater trochanteric injections we performed last visit with really modest results, although they did help slightly  The gabapentin helps but she cannot tolerate more than the one capsule at night which helps her sleep.  More than that, she feels loopy.   Dr. Gerarda Fraction prescribes her oxycodone.   She is interested in a "dexamethasone shot".        Pain Inventory Average Pain 10 Pain Right Now 10 My pain is sharp and stabbing  In the last 24 hours, has pain interfered with the following? General activity 10 Relation with others 10 Enjoyment of life 10 What TIME of day is your pain at its worst? morning and night Sleep (in general) NA  Pain is worse with: walking, bending, sitting and some activites Pain improves with: rest, heat/ice, therapy/exercise, pacing activities, medication and injections Relief from Meds: 8  Mobility use a walker how many minutes can you walk? 15-20 ability to climb steps?  yes do you drive?  yes  Function disabled: date disabled 2010 I need assistance with the following:  meal prep, household duties and shopping  Neuro/Psych bladder control problems bowel control problems weakness spasms loss of taste or smell  Prior Studies Any changes since last visit?  yes  Physicians involved in your care Any changes since last visit?  no   Family History  Problem Relation Age of Onset  . Breast cancer Paternal Aunt   . Diabetes Mother   . Heart failure Mother   . Breast cancer Sister   . CAD Father   . Hypertension Father   . Cancer Paternal Grandfather   . Hypertension Paternal Grandmother   . Stroke Paternal Grandmother   . Other Maternal Grandmother     tonsilitis  . Diabetes Maternal Grandfather   .  Hypertension Maternal Grandfather    Social History   Social History  . Marital Status: Single    Spouse Name: N/A  . Number of Children: 0  . Years of Education: N/A   Occupational History  . Volunteers   . 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 Smoking status: Former Smoker    Types: Cigarettes    Quit date: 08/08/1996  . Smokeless tobacco: Never Used  . Alcohol Use: No  . Drug Use: No  . Sexual Activity: No   Other Topics Concern  . None   Social History Narrative   Past Surgical History  Procedure Laterality Date  . Laparoscopic cholecystectomy  1985  . Laparoscopic gastric banding  12/12/2010  . Pilonidal cyst excision  1974  . Tumor excision  10/2011    left thigh  . Tumor excision  10/2011    on face  . Colonoscopy  10/18/10    SLF:2-mm sessile sigmoid polyp/diverticula  . Hemorrhoid banding  07/2012    Dr. Oneida Alar  . Cardiac catheterization  07/2007, 05/2012    normal coronary arteries  . Left and right heart catheterization with coronary angiogram N/A 06/03/2012    Procedure: LEFT AND RIGHT HEART CATHETERIZATION WITH CORONARY ANGIOGRAM;  Surgeon: Jolaine Artist, MD;  Location: [facility] CATH LAB;  Service: Cardiovascular;  Laterality:  N/A;   Past Medical History  Diagnosis Date  . CHF (congestive heart failure) (HCC)     Normal left ventricular systolic function, ejection fraction 55-60% 07/2007 by cath.  . Diabetes mellitus   . Sleep apnea     CPAP use  . Pulmonary HTN ([place])     [place] 2008:  RA pressures 13/10 with a mean of 7 mmHg.  RV pressure 123456 with end diastolic pressure of 15 mmHg.  PA pressure 49/23 with a mean of 35 mmHg.  Pulmonary capillary wedge 17/50 with a mean of 14 mmHg. The PA saturation was 68%.  RA saturation was 71% and aortic saturation was 90%.  Cardiac output was 6.0 with a cardiac index of 2.80 by Fick.  Normal coronaries by cath 2008.  . Morbid obesity ([place])     s/p lap band surgery  .  Asthma   . Arthritis   . Hypertriglyceridemia   . COPD (chronic obstructive pulmonary disease) ([place])   . IBS (irritable bowel syndrome)   . Renal insufficiency   . Chronic back pain   . Chronic hip pain   . Sciatica of right side   . Uterine fibroid   . Internal hemorrhoids   . Hot flashes 10/06/2013  . Fibroid 10/13/2013  . Adnexal cyst 10/13/2013    Right simple cyst seen on [place]  . Herpes simplex virus (HSV) infection    BP 137/69 mmHg  Pulse 82  SpO2 96%  LMP 06/06/2013  Opioid Risk Score:   Fall Risk Score:  `1  Depression screen PHQ 2/9  Depression screen [facility] 2/9 07/25/2015 07/25/2015  Decreased Interest 0 0  Down, Depressed, Hopeless 0 0  PHQ - 2 Score 0 0  Altered sleeping 2 -  Tired, decreased energy 2 -  Change in appetite 1 -  Feeling bad or failure about yourself  0 -  Trouble concentrating 0 -  Moving slowly or fidgety/restless 0 -  Suicidal thoughts 0 -  PHQ-9 Score 5 -     Review of Systems  Constitutional: Positive for diaphoresis.  Respiratory: Positive for apnea, cough, shortness of breath and wheezing.   Cardiovascular: Positive for leg swelling.  Gastrointestinal: Positive for nausea, vomiting, abdominal pain and diarrhea.  Genitourinary:       Bladder control problems  Musculoskeletal:       Spasms  All other systems reviewed and are negative.      Objective:   Physical Exam  Well-developed morbidly obese woman who appears her stated age and does not appear in any distress  Left middle toe nail- cut too short with evidence of prior bleeding  Cranial nerves are grossly intact.  Coordination is grossly intact  Reflexes are 2+ in the upper extremities and remain diminished in both lower extremities  Motor strength 5/5 upper extremities, 5/5 lower extremities except bilateral hip flexors she has difficulty lifting however abdominal peniculus extends over thighs inhibiting lift.  She reports intact sensation in bilateral upper extremities to  light touch and pinprick.  She reports decreased sensation right lower extremity below knee.  Transitions from sit to stand slowly.  Gait wide based, slow  .  Heel toe walking not assessed due to concern regarding balance/obesity  Lumbar forward flexion limited due to girth---45 degrees. Very limited in extension. Struggles standing from seated position.  SLR neg. FABER neg. Substantial pain with palpation of right and left greater trochs persists. She also has diffuse pain in many other areas inclucing the shoulders, neck,  occiput, knees.    Assessment & Plan:   1. Lumbago with known facet arthropathy/L4-5 anterolisthesis, pain with lumbar extension May have some mild root involvement, the some numbness in the right lower extremity without obvious weakness.  2. Bilateral ight trochanteric bursitis/iliotibial band syndrome  3. ?Right L3 radiculopathy by history and recent MRI  4. Morbid obesity  5. ?FMS     Plan:  1. Gabapentin 100mg  qhs. Cymbalta would likely be beneficial. Pt seems resistant to a lot of my recommendations. She is worried about the connotations of taking an antidepressant. She doesn't want back injections, etc.  2. After informed consent and preparation of the skin with betadine and isopropyl alcohol, I injected 6mg  (1cc) of celestone and 4cc of 1% lidocaine around the bilateral greater trochanters via lateral approach. Additionally, aspiration was performed prior to injection. The patient tolerated well, and no complications were encountered. Afterward the area was cleaned and dressed. Post- injection instructions were provided.   3. Exercise, stretching, weight loss remain paramount. She feels that once things settle down over the next month she will be able to get to the gym.  4. She is currently on oxycodone per Dr. Gerarda Fraction.  5. 30 minutes of face to face patient care time were spent during this visit. All questions were encouraged and answered. I'll see her back in 3  months.

## 2015-10-11 NOTE — Patient Instructions (Signed)
CONTINUE TO WORK ON MAKING TIME FOR YOUR EXERCISES AND WELLNESS ACTIVITIES.

## 2015-10-12 ENCOUNTER — Encounter: Payer: Commercial Managed Care - HMO | Admitting: Physical Medicine & Rehabilitation

## 2015-11-14 ENCOUNTER — Telehealth: Payer: Self-pay | Admitting: Gastroenterology

## 2015-11-14 NOTE — Telephone Encounter (Signed)
LEFT MESSAGE FOR PT TO CALL, NEEDS OV WITH [facility] BEFORE THEY WILL DO A REFERRAL FOR HER INSURANCE

## 2015-11-17 ENCOUNTER — Ambulatory Visit: Payer: Commercial Managed Care - HMO | Admitting: Gastroenterology

## 2015-11-23 DIAGNOSIS — E1165 Type 2 diabetes mellitus with hyperglycemia: Secondary | ICD-10-CM | POA: Diagnosis not present

## 2015-11-23 DIAGNOSIS — J45909 Unspecified asthma, uncomplicated: Secondary | ICD-10-CM | POA: Diagnosis not present

## 2015-12-12 DIAGNOSIS — G4733 Obstructive sleep apnea (adult) (pediatric): Secondary | ICD-10-CM | POA: Diagnosis not present

## 2016-01-17 ENCOUNTER — Encounter
Payer: Commercial Managed Care - HMO | Attending: Physical Medicine & Rehabilitation | Admitting: Physical Medicine & Rehabilitation

## 2016-01-17 ENCOUNTER — Encounter: Payer: Self-pay | Admitting: Physical Medicine & Rehabilitation

## 2016-01-17 ENCOUNTER — Encounter: Payer: Commercial Managed Care - HMO | Admitting: Physical Medicine & Rehabilitation

## 2016-01-17 VITALS — BP 127/62 | HR 92

## 2016-01-17 DIAGNOSIS — M7061 Trochanteric bursitis, right hip: Secondary | ICD-10-CM

## 2016-01-17 DIAGNOSIS — M47816 Spondylosis without myelopathy or radiculopathy, lumbar region: Secondary | ICD-10-CM | POA: Diagnosis not present

## 2016-01-17 DIAGNOSIS — M7060 Trochanteric bursitis, unspecified hip: Secondary | ICD-10-CM | POA: Diagnosis not present

## 2016-01-17 DIAGNOSIS — M25551 Pain in right hip: Secondary | ICD-10-CM | POA: Diagnosis present

## 2016-01-17 DIAGNOSIS — M7062 Trochanteric bursitis, left hip: Secondary | ICD-10-CM | POA: Diagnosis not present

## 2016-01-17 DIAGNOSIS — M5416 Radiculopathy, lumbar region: Secondary | ICD-10-CM | POA: Diagnosis not present

## 2016-01-17 DIAGNOSIS — Z87891 Personal history of nicotine dependence: Secondary | ICD-10-CM | POA: Insufficient documentation

## 2016-01-17 DIAGNOSIS — E119 Type 2 diabetes mellitus without complications: Secondary | ICD-10-CM | POA: Diagnosis not present

## 2016-01-17 DIAGNOSIS — M4316 Spondylolisthesis, lumbar region: Secondary | ICD-10-CM | POA: Insufficient documentation

## 2016-01-17 DIAGNOSIS — K589 Irritable bowel syndrome without diarrhea: Secondary | ICD-10-CM | POA: Insufficient documentation

## 2016-01-17 DIAGNOSIS — M545 Low back pain: Secondary | ICD-10-CM | POA: Insufficient documentation

## 2016-01-17 DIAGNOSIS — M25552 Pain in left hip: Secondary | ICD-10-CM | POA: Diagnosis present

## 2016-01-17 DIAGNOSIS — R209 Unspecified disturbances of skin sensation: Secondary | ICD-10-CM | POA: Diagnosis not present

## 2016-01-17 DIAGNOSIS — J449 Chronic obstructive pulmonary disease, unspecified: Secondary | ICD-10-CM | POA: Diagnosis not present

## 2016-01-17 NOTE — Progress Notes (Signed)
Subjective:    Patient ID: Sara Tran, female    DOB: 1956/09/09, 60 y.o.   MRN: [id number]  HPI   Mrs. Merriett is here in follow up of her chronic pain. She states that the gabapentin helps her pain in the legs but over the last 3+ weeks she has had increasing pain in her low back and both buttocks. She feels like the area "contracts". It feels a little better when she sits down or uses ice.   In October we performed injections in both trochanters which provided her relief for several weeks, especially the first two weeks.   Pain Inventory Average Pain 10 Pain Right Now 8 My pain is constant, sharp, burning, dull, stabbing and aching  In the last 24 hours, has pain interfered with the following? General activity 10 Relation with others 10 Enjoyment of life 10 What TIME of day is your pain at its worst? morning and night Sleep (in general) Poor  Pain is worse with: walking, bending and standing Pain improves with: rest, heat/ice, therapy/exercise, medication and injections Relief from Meds: 7  Mobility walk without assistance use a cane use a walker how many minutes can you walk? 10 ability to climb steps?  yes do you drive?  yes  Function disabled: date disabled 2008 I need assistance with the following:  meal prep and household duties  Neuro/Psych bladder control problems bowel control problems weakness numbness tremor tingling spasms dizziness  Prior Studies Any changes since last visit?  no  Physicians involved in your care Any changes since last visit?  no   Family History  Problem Relation Age of Onset  . Breast cancer Paternal Aunt   . Diabetes Mother   . Heart failure Mother   . Breast cancer Sister   . CAD Father   . Hypertension Father   . Cancer Paternal Grandfather   . Hypertension Paternal Grandmother   . Stroke Paternal Grandmother   . Other Maternal Grandmother     tonsilitis  . Diabetes Maternal Grandfather   . Hypertension  Maternal Grandfather    Social History   Social History  . Marital Status: Single    Spouse Name: N/A  . Number of Children: 0  . Years of Education: N/A   Occupational History  . Volunteers   . 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 Smoking status: Former Smoker    Types: Cigarettes    Quit date: 08/08/1996  . Smokeless tobacco: Never Used  . Alcohol Use: No  . Drug Use: No  . Sexual Activity: No   Other Topics Concern  . None   Social History Narrative   Past Surgical History  Procedure Laterality Date  . Laparoscopic cholecystectomy  1985  . Laparoscopic gastric banding  12/12/2010  . Pilonidal cyst excision  1974  . Tumor excision  10/2011    left thigh  . Tumor excision  10/2011    on face  . Colonoscopy  10/18/10    SLF:2-mm sessile sigmoid polyp/diverticula  . Hemorrhoid banding  07/2012    Dr. Oneida Alar  . Cardiac catheterization  07/2007, 05/2012    normal coronary arteries  . Left and right heart catheterization with coronary angiogram N/A 06/03/2012    Procedure: LEFT AND RIGHT HEART CATHETERIZATION WITH CORONARY ANGIOGRAM;  Surgeon: Jolaine Artist, MD;  Location: [facility] CATH LAB;  Service: Cardiovascular;  Laterality: N/A;   Past Medical  History  Diagnosis Date  . CHF (congestive heart failure) (HCC)     Normal left ventricular systolic function, ejection fraction 55-60% 07/2007 by cath.  . Diabetes mellitus   . Sleep apnea     CPAP use  . Pulmonary HTN ([place])     [place] 2008:  RA pressures 13/10 with a mean of 7 mmHg.  RV pressure 123456 with end diastolic pressure of 15 mmHg.  PA pressure 49/23 with a mean of 35 mmHg.  Pulmonary capillary wedge 17/50 with a mean of 14 mmHg. The PA saturation was 68%.  RA saturation was 71% and aortic saturation was 90%.  Cardiac output was 6.0 with a cardiac index of 2.80 by Fick.  Normal coronaries by cath 2008.  . Morbid obesity ([place])     s/p lap band surgery  . Asthma   .  Arthritis   . Hypertriglyceridemia   . COPD (chronic obstructive pulmonary disease) ([place])   . IBS (irritable bowel syndrome)   . Renal insufficiency   . Chronic back pain   . Chronic hip pain   . Sciatica of right side   . Uterine fibroid   . Internal hemorrhoids   . Hot flashes 10/06/2013  . Fibroid 10/13/2013  . Adnexal cyst 10/13/2013    Right simple cyst seen on [place]  . Herpes simplex virus (HSV) infection    BP 127/62 mmHg  Pulse 92  SpO2 98%  LMP 06/06/2013  Opioid Risk Score:   Fall Risk Score:  `1  Depression screen PHQ 2/9  Depression screen [facility] 2/9 01/17/2016 07/25/2015 07/25/2015  Decreased Interest 0 0 0  Down, Depressed, Hopeless 0 0 0  PHQ - 2 Score 0 0 0  Altered sleeping - 2 -  Tired, decreased energy - 2 -  Change in appetite - 1 -  Feeling bad or failure about yourself  - 0 -  Trouble concentrating - 0 -  Moving slowly or fidgety/restless - 0 -  Suicidal thoughts - 0 -  PHQ-9 Score - 5 -     Review of Systems  Constitutional: Positive for diaphoresis.  Respiratory: Positive for apnea, shortness of breath and wheezing.   Cardiovascular: Positive for leg swelling.  Gastrointestinal: Positive for abdominal pain and diarrhea.  Endocrine:       High blood sugar  All other systems reviewed and are negative.      Objective:   Physical Exam  Well-developed morbidly obese woman who appears her stated age and does not appear in any distress  Left middle toe nail- cut too short with evidence of prior bleeding  Cranial nerves are grossly intact.  Coordination is grossly intact  Reflexes are 2+ in the upper extremities and remain diminished in both lower extremities  Motor strength 5/5 upper extremities, 5/5 lower extremities except bilateral hip flexors she has difficulty lifting however abdominal peniculus extends over thighs inhibiting lift.  She reports intact sensation in bilateral upper extremities to light touch and pinprick.  She reports decreased  sensation right lower extremity below knee.  Transitions from sit to stand slowly.  Gait wide based, slow  .  Heel toe walking not assessed due to concern regarding balance/obesity  Lumbar forward flexion limited due to girth---45 degrees. Very limited in extension. Struggles standing from seated position.  SLR neg. FABER neg. Substantial pain with palpation of right and left greater trochs persists. She also has diffuse pain in many other areas inclucing the shoulders, neck, occiput, knees.  Assessment & Plan:   1. Lumbago with known facet arthropathy/L4-5 anterolisthesis, pain with lumbar extension May have some mild root involvement, the some numbness in the right lower extremity without obvious weakness.  2. Bilateral ight trochanteric bursitis/iliotibial band syndrome  3. ?Right L3 radiculopathy by history and recent MRI  4. Morbid obesity  5. ?FMS    Plan:  1. Gabapentin 100mg  qhs. Cymbalta would likely be beneficial.  Continue as she's doing 2. After informed consent and preparation of the skin with betadine and isopropyl alcohol, I injected 6mg  (1cc) of celestone and 4cc of 1% lidocaine around the bilateral greater trochanters via lateral approach. Additionally, aspiration was performed prior to injection. The patient tolerated well, and no complications were encountered. Afterward the area was cleaned and dressed. Post- injection instructions were provided in addition to exercises. 3. Encouraged increased stretching and weight loss. Facet exercises were provided.  4. She is currently on oxycodone per Dr. Gerarda Fraction.  5. 30 minutes of face to face patient care time were spent during this visit. All questions were encouraged and answered. I'll see her back in 3 months.

## 2016-01-17 NOTE — Patient Instructions (Addendum)
PLEASE CALL ME WITH ANY PROBLEMS OR QUESTIONS [id number]).     Trochanteric Bursitis You have hip pain due to trochanteric bursitis. Bursitis means that the sack near the outside of the hip is filled with fluid and inflamed. This sack is made up of protective soft tissue. The pain from trochanteric bursitis can be severe and keep you from sleep. It can radiate to the buttocks or down the outside of the thigh to the knee. The pain is almost always worse when rising from the seated or lying position and with walking. Pain can improve after you take a few steps. It happens more often in people with hip joint and lumbar spine problems, such as arthritis or previous surgery. Very rarely the trochanteric bursa can become infected, and antibiotics and/or surgery may be needed. Treatment often includes an injection of local anesthetic mixed with cortisone medicine. This medicine is injected into the area where it is most tender over the hip. Repeat injections may be necessary if the response to treatment is slow. You can apply ice packs over the tender area for 30 minutes every 2 hours for the next few days. Anti-inflammatory and/or narcotic pain medicine may also be helpful. Limit your activity for the next few days if the pain continues. See your caregiver in 5-10 days if you are not greatly improved.  SEEK IMMEDIATE MEDICAL CARE IF:  You develop severe pain, fever, or increased redness.  You have pain that radiates below the knee. EXERCISES STRETCHING EXERCISES - Trochanteric Bursitis  These exercises may help you when beginning to rehabilitate your injury. Your symptoms may resolve with or without further involvement from your physician, physical therapist, or athletic trainer. While completing these exercises, remember:   Restoring tissue flexibility helps normal motion to return to the joints. This allows healthier, less painful movement and activity.  An effective stretch should be held for at  least 30 seconds.  A stretch should never be painful. You should only feel a gentle lengthening or release in the stretched tissue. STRETCH - Iliotibial Band  On the floor or bed, lie on your side so your injured leg is on top. Bend your knee and grab your ankle.  Slowly bring your knee back so that your thigh is in line with your trunk. Keep your heel at your buttocks and gently arch your back so your head, shoulders and hips line up.  Slowly lower your leg so that your knee approaches the floor/bed until you feel a gentle stretch on the outside of your thigh. If you do not feel a stretch and your knee will not fall farther, place the heel of your opposite foot on top of your knee and pull your thigh down farther.  Hold this stretch for __________ seconds.  Repeat __________ times. Complete this exercise __________ times per day. STRETCH - Hamstrings, Supine   Lie on your back. Loop a belt or towel over the ball of your foot as shown.  Straighten your knee and slowly pull on the belt to raise your injured leg. Do not allow the knee to bend. Keep your opposite leg flat on the floor.  Raise the leg until you feel a gentle stretch behind your knee or thigh. Hold this position for __________ seconds.  Repeat __________ times. Complete this stretch __________ times per day. STRETCH - Quadriceps, Prone   Lie on your stomach on a firm surface, such as a bed or padded floor.  Bend your knee and grasp your ankle. If  you are unable to reach your ankle or pant leg, use a belt around your foot to lengthen your reach.  Gently pull your heel toward your buttocks. Your knee should not slide out to the side. You should feel a stretch in the front of your thigh and/or knee.  Hold this position for __________ seconds.  Repeat __________ times. Complete this stretch __________ times per day. STRETCHING - Hip Flexors, Lunge Half kneel with your knee on the floor and your opposite knee bent and  directly over your ankle.  Keep good posture with your head over your shoulders. Tighten your buttocks to point your tailbone downward; this will prevent your back from arching too much.  You should feel a gentle stretch in the front of your thigh and/or hip. If you do not feel any resistance, slightly slide your opposite foot forward and then slowly lunge forward so your knee once again lines up over your ankle. Be sure your tailbone remains pointed downward.  Hold this stretch for __________ seconds.  Repeat __________ times. Complete this stretch __________ times per day. STRETCH - Adductors, Lunge  While standing, spread your legs.  Lean away from your injured leg by bending your opposite knee. You may rest your hands on your thigh for balance.  You should feel a stretch in your inner thigh. Hold for __________ seconds.  Repeat __________ times. Complete this exercise __________ times per day.   This information is not intended to replace advice given to you by your health care provider. Make sure you discuss any questions you have with your health care provider.   Document Released: 01/24/2005 Document Revised: 05/03/2015 Document Reviewed: 03/31/2009 Elsevier Interactive Patient Education [employer].

## 2016-02-03 DIAGNOSIS — E1165 Type 2 diabetes mellitus with hyperglycemia: Secondary | ICD-10-CM | POA: Diagnosis not present

## 2016-02-03 DIAGNOSIS — I10 Essential (primary) hypertension: Secondary | ICD-10-CM | POA: Diagnosis not present

## 2016-02-03 DIAGNOSIS — J069 Acute upper respiratory infection, unspecified: Secondary | ICD-10-CM | POA: Diagnosis not present

## 2016-02-03 DIAGNOSIS — Z23 Encounter for immunization: Secondary | ICD-10-CM | POA: Diagnosis not present

## 2016-02-03 DIAGNOSIS — E782 Mixed hyperlipidemia: Secondary | ICD-10-CM | POA: Diagnosis not present

## 2016-02-03 DIAGNOSIS — M1991 Primary osteoarthritis, unspecified site: Secondary | ICD-10-CM | POA: Diagnosis not present

## 2016-02-03 DIAGNOSIS — Z6841 Body Mass Index (BMI) 40.0 and over, adult: Secondary | ICD-10-CM | POA: Diagnosis not present

## 2016-02-03 DIAGNOSIS — Z1389 Encounter for screening for other disorder: Secondary | ICD-10-CM | POA: Diagnosis not present

## 2016-02-14 ENCOUNTER — Other Ambulatory Visit: Payer: Self-pay

## 2016-02-14 NOTE — Telephone Encounter (Signed)
[facility]OM TO CALL AND MAKE AN APPOINTMENT

## 2016-02-14 NOTE — Telephone Encounter (Signed)
Patient has not been seen in over two years. Will need OV prior to further RX from here. She can request RX from her PCP or make OV here.

## 2016-02-17 ENCOUNTER — Ambulatory Visit (HOSPITAL_COMMUNITY)
Admission: RE | Admit: 2016-02-17 | Discharge: 2016-02-17 | Disposition: A | Discharge status: Home / Self Care | Payer: Commercial Managed Care - HMO | Source: Ambulatory Visit | Attending: Family Medicine | Admitting: Family Medicine

## 2016-02-17 ENCOUNTER — Other Ambulatory Visit (HOSPITAL_COMMUNITY): Payer: Self-pay | Admitting: Family Medicine

## 2016-02-17 DIAGNOSIS — M542 Cervicalgia: Secondary | ICD-10-CM | POA: Diagnosis not present

## 2016-02-17 DIAGNOSIS — M5030 Other cervical disc degeneration, unspecified cervical region: Secondary | ICD-10-CM | POA: Insufficient documentation

## 2016-02-17 DIAGNOSIS — M79671 Pain in right foot: Secondary | ICD-10-CM

## 2016-02-17 DIAGNOSIS — Z1389 Encounter for screening for other disorder: Secondary | ICD-10-CM | POA: Diagnosis not present

## 2016-02-17 DIAGNOSIS — Z6841 Body Mass Index (BMI) 40.0 and over, adult: Secondary | ICD-10-CM | POA: Diagnosis not present

## 2016-03-05 ENCOUNTER — Other Ambulatory Visit: Payer: Self-pay

## 2016-03-05 DIAGNOSIS — M5416 Radiculopathy, lumbar region: Secondary | ICD-10-CM

## 2016-03-05 MED ORDER — GABAPENTIN 100 MG PO CAPS: 100.0000 mg | ORAL_CAPSULE | Freq: Three times a day (TID) | ORAL | Status: DC

## 2016-03-08 ENCOUNTER — Other Ambulatory Visit: Payer: Self-pay | Admitting: *Deleted

## 2016-03-08 DIAGNOSIS — M5416 Radiculopathy, lumbar region: Secondary | ICD-10-CM

## 2016-03-08 MED ORDER — GABAPENTIN 100 MG PO CAPS: 100.0000 mg | ORAL_CAPSULE | Freq: Three times a day (TID) | ORAL | Status: DC

## 2016-03-08 NOTE — Telephone Encounter (Signed)
Patient wants to use [facility]

## 2016-03-21 ENCOUNTER — Encounter
Payer: Commercial Managed Care - HMO | Attending: Physical Medicine & Rehabilitation | Admitting: Physical Medicine & Rehabilitation

## 2016-03-21 ENCOUNTER — Encounter: Payer: Self-pay | Admitting: Physical Medicine & Rehabilitation

## 2016-03-21 VITALS — BP 144/58 | HR 115 | Resp 16

## 2016-03-21 DIAGNOSIS — K589 Irritable bowel syndrome without diarrhea: Secondary | ICD-10-CM | POA: Insufficient documentation

## 2016-03-21 DIAGNOSIS — E119 Type 2 diabetes mellitus without complications: Secondary | ICD-10-CM | POA: Diagnosis not present

## 2016-03-21 DIAGNOSIS — M545 Low back pain: Secondary | ICD-10-CM | POA: Diagnosis not present

## 2016-03-21 DIAGNOSIS — M5416 Radiculopathy, lumbar region: Secondary | ICD-10-CM

## 2016-03-21 DIAGNOSIS — M4316 Spondylolisthesis, lumbar region: Secondary | ICD-10-CM | POA: Insufficient documentation

## 2016-03-21 DIAGNOSIS — J449 Chronic obstructive pulmonary disease, unspecified: Secondary | ICD-10-CM | POA: Diagnosis not present

## 2016-03-21 DIAGNOSIS — R209 Unspecified disturbances of skin sensation: Secondary | ICD-10-CM | POA: Diagnosis not present

## 2016-03-21 DIAGNOSIS — M25551 Pain in right hip: Secondary | ICD-10-CM | POA: Diagnosis present

## 2016-03-21 DIAGNOSIS — M25552 Pain in left hip: Secondary | ICD-10-CM | POA: Diagnosis present

## 2016-03-21 DIAGNOSIS — M47816 Spondylosis without myelopathy or radiculopathy, lumbar region: Secondary | ICD-10-CM

## 2016-03-21 DIAGNOSIS — M7060 Trochanteric bursitis, unspecified hip: Secondary | ICD-10-CM | POA: Insufficient documentation

## 2016-03-21 DIAGNOSIS — Z87891 Personal history of nicotine dependence: Secondary | ICD-10-CM | POA: Diagnosis not present

## 2016-03-21 MED ORDER — PREDNISONE 20 MG PO TABS
20.0000 mg | ORAL_TABLET | ORAL | Status: DC
Start: 2016-03-21 — End: 2016-04-02
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
## 2016-04-01 ENCOUNTER — Emergency Department (HOSPITAL_COMMUNITY): Payer: Commercial Managed Care - HMO

## 2016-04-01 ENCOUNTER — Encounter (HOSPITAL_COMMUNITY): Payer: Self-pay

## 2016-04-01 ENCOUNTER — Observation Stay (HOSPITAL_COMMUNITY)
Admission: EM | Admit: 2016-04-01 | Discharge: 2016-04-02 | Disposition: A | Discharge status: Home / Self Care | Payer: Commercial Managed Care - HMO | Attending: Internal Medicine | Admitting: Internal Medicine

## 2016-04-01 DIAGNOSIS — Z7984 Long term (current) use of oral hypoglycemic drugs: Secondary | ICD-10-CM | POA: Diagnosis not present

## 2016-04-01 DIAGNOSIS — Z87891 Personal history of nicotine dependence: Secondary | ICD-10-CM | POA: Insufficient documentation

## 2016-04-01 DIAGNOSIS — R079 Chest pain, unspecified: Secondary | ICD-10-CM | POA: Diagnosis not present

## 2016-04-01 DIAGNOSIS — J45909 Unspecified asthma, uncomplicated: Secondary | ICD-10-CM | POA: Diagnosis not present

## 2016-04-01 DIAGNOSIS — Z79899 Other long term (current) drug therapy: Secondary | ICD-10-CM | POA: Diagnosis not present

## 2016-04-01 DIAGNOSIS — E119 Type 2 diabetes mellitus without complications: Secondary | ICD-10-CM

## 2016-04-01 DIAGNOSIS — J449 Chronic obstructive pulmonary disease, unspecified: Secondary | ICD-10-CM | POA: Insufficient documentation

## 2016-04-01 DIAGNOSIS — R0789 Other chest pain: Secondary | ICD-10-CM

## 2016-04-01 DIAGNOSIS — I509 Heart failure, unspecified: Secondary | ICD-10-CM | POA: Diagnosis not present

## 2016-04-01 HISTORY — DX: Other specified postprocedural states: Z98.890

## 2016-04-01 HISTORY — DX: Type 2 diabetes mellitus without complications: E11.9

## 2016-04-01 LAB — COMPREHENSIVE METABOLIC PANEL
ALT: 23 U/L (ref 14–54)
AST: 32 U/L (ref 15–41)
Albumin: 3.9 g/dL (ref 3.5–5.0)
Alkaline Phosphatase: 48 U/L (ref 38–126)
Anion gap: 11 (ref 5–15)
BUN: 9 mg/dL (ref 6–20)
CO2: 21 mmol/L — ABNORMAL LOW (ref 22–32)
Calcium: 9.1 mg/dL (ref 8.9–10.3)
Chloride: 108 mmol/L (ref 101–111)
Creatinine, Ser: 1.07 mg/dL — ABNORMAL HIGH (ref 0.44–1.00)
GFR calc Af Amer: >60 mL/min (ref >60)
GFR calc non Af Amer: 56 mL/min — ABNORMAL LOW (ref >60)
Glucose, Bld: 190 mg/dL — ABNORMAL HIGH (ref 65–99)
Potassium: 4 mmol/L (ref 3.5–5.1)
Sodium: 140 mmol/L (ref 135–145)
Total Bilirubin: 0.5 mg/dL (ref 0.3–1.2)
Total Protein: 7.9 g/dL (ref 6.5–8.1)

## 2016-04-01 LAB — CBC WITH DIFFERENTIAL/PLATELET
Basophils Absolute: 0 10*3/uL (ref 0.0–0.1)
Basophils Relative: 0 %
Eosinophils Absolute: 0.4 10*3/uL (ref 0.0–0.7)
Eosinophils Relative: 3 %
HCT: 36.7 % (ref 36.0–46.0)
Hemoglobin: 12.3 g/dL (ref 12.0–15.0)
Lymphocytes Relative: 15 %
Lymphs Abs: 1.9 10*3/uL (ref 0.7–4.0)
MCH: 31.5 pg (ref 26.0–34.0)
MCHC: 33.5 g/dL (ref 30.0–36.0)
MCV: 94.1 fL (ref 78.0–100.0)
Monocytes Absolute: 0.7 10*3/uL (ref 0.1–1.0)
Monocytes Relative: 6 %
Neutro Abs: 9.3 10*3/uL — ABNORMAL HIGH (ref 1.7–7.7)
Neutrophils Relative %: 76 %
Platelets: 349 10*3/uL (ref 150–400)
RBC: 3.9 MIL/uL (ref 3.87–5.11)
RDW: 13.1 % (ref 11.5–15.5)
WBC: 12.2 10*3/uL — ABNORMAL HIGH (ref 4.0–10.5)

## 2016-04-01 LAB — LIPASE, BLOOD: Lipase: 19 U/L (ref 11–51)

## 2016-04-01 LAB — GLUCOSE, CAPILLARY
Glucose-Capillary: 153 mg/dL — ABNORMAL HIGH (ref 65–99)
Glucose-Capillary: 185 mg/dL — ABNORMAL HIGH (ref 65–99)

## 2016-04-01 LAB — TROPONIN I
Troponin I: <0.03 ng/mL (ref <0.031)
Troponin I: <0.03 ng/mL (ref <0.031)
Troponin I: <0.03 ng/mL (ref <0.031)

## 2016-04-01 LAB — MAGNESIUM: Magnesium: 1.3 mg/dL — ABNORMAL LOW (ref 1.7–2.4)

## 2016-04-01 LAB — BRAIN NATRIURETIC PEPTIDE: B Natriuretic Peptide: 24 pg/mL (ref 0.0–100.0)

## 2016-04-01 MED ORDER — ONDANSETRON HCL 4 MG/2ML IJ SOLN: 4.0000 mg | Freq: Four times a day (QID) | INTRAMUSCULAR | Status: DC | PRN

## 2016-04-01 MED ORDER — ALPRAZOLAM 0.5 MG PO TABS: 0.5000 mg | ORAL_TABLET | Freq: Three times a day (TID) | ORAL | Status: DC | PRN

## 2016-04-01 MED ORDER — MORPHINE SULFATE (PF) 4 MG/ML IV SOLN
4.0000 mg | INTRAVENOUS | Status: DC | PRN
  Administered 2016-04-01: 4 mg via INTRAVENOUS
  Filled 2016-04-01: qty 1

## 2016-04-01 MED ORDER — DICYCLOMINE HCL 10 MG PO CAPS: 10.0000 mg | ORAL_CAPSULE | Freq: Three times a day (TID) | ORAL | Status: DC | PRN

## 2016-04-01 MED ORDER — POTASSIUM CHLORIDE CRYS ER 20 MEQ PO TBCR
20.0000 meq | EXTENDED_RELEASE_TABLET | Freq: Every morning | ORAL | Status: DC
  Administered 2016-04-02: 20 meq via ORAL
  Filled 2016-04-01: qty 1

## 2016-04-01 MED ORDER — GI COCKTAIL ~~LOC~~: 30.0000 mL | Freq: Four times a day (QID) | ORAL | Status: DC | PRN

## 2016-04-01 MED ORDER — INSULIN ASPART 100 UNIT/ML ~~LOC~~ SOLN
0.0000 [IU] | Freq: Three times a day (TID) | SUBCUTANEOUS | Status: DC
  Administered 2016-04-02: 3 [IU] via SUBCUTANEOUS

## 2016-04-01 MED ORDER — INSULIN ASPART 100 UNIT/ML ~~LOC~~ SOLN
0.0000 [IU] | Freq: Every day | SUBCUTANEOUS | Status: DC
Start: 2016-04-01 — End: 2016-04-02

## 2016-04-01 MED ORDER — ACETAMINOPHEN 325 MG PO TABS: 650.0000 mg | ORAL_TABLET | ORAL | Status: DC | PRN

## 2016-04-01 MED ORDER — BUDESONIDE 0.5 MG/2ML IN SUSP
2.0000 mL | Freq: Two times a day (BID) | RESPIRATORY_TRACT | Status: DC
  Administered 2016-04-01 – 2016-04-02 (×2): 0.5 mg via RESPIRATORY_TRACT
  Filled 2016-04-01 (×2): qty 2

## 2016-04-01 MED ORDER — FLUTICASONE PROPIONATE 50 MCG/ACT NA SUSP
1.0000 | Freq: Every day | NASAL | Status: DC
  Administered 2016-04-01: 1 via NASAL
  Administered 2016-04-02: 2 via NASAL
  Filled 2016-04-01: qty 16

## 2016-04-01 MED ORDER — AMLODIPINE BESYLATE 5 MG PO TABS
10.0000 mg | ORAL_TABLET | Freq: Every evening | ORAL | Status: DC
  Administered 2016-04-01: 10 mg via ORAL
  Filled 2016-04-01: qty 2

## 2016-04-01 MED ORDER — TRAMADOL HCL 50 MG PO TABS
50.0000 mg | ORAL_TABLET | Freq: Four times a day (QID) | ORAL | Status: DC | PRN
  Administered 2016-04-01 – 2016-04-02 (×2): 50 mg via ORAL
  Filled 2016-04-01 (×2): qty 1

## 2016-04-01 MED ORDER — FENOFIBRATE 160 MG PO TABS
160.0000 mg | ORAL_TABLET | Freq: Every day | ORAL | Status: DC
  Administered 2016-04-01 – 2016-04-02 (×2): 160 mg via ORAL
  Filled 2016-04-01 (×2): qty 1

## 2016-04-01 MED ORDER — MAGNESIUM SULFATE 2 GM/50ML IV SOLN
2.0000 g | Freq: Once | INTRAVENOUS | Status: AC
  Administered 2016-04-01: 2 g via INTRAVENOUS
  Filled 2016-04-01: qty 50

## 2016-04-01 MED ORDER — LORATADINE 10 MG PO TABS
10.0000 mg | ORAL_TABLET | Freq: Every morning | ORAL | Status: DC
  Administered 2016-04-02: 10 mg via ORAL
  Filled 2016-04-01: qty 1

## 2016-04-01 MED ORDER — FUROSEMIDE 40 MG PO TABS: 40.0000 mg | ORAL_TABLET | Freq: Every day | ORAL | Status: DC | PRN

## 2016-04-01 MED ORDER — ENOXAPARIN SODIUM 60 MG/0.6ML ~~LOC~~ SOLN
60.0000 mg | SUBCUTANEOUS | Status: DC
  Administered 2016-04-01: 60 mg via SUBCUTANEOUS
  Filled 2016-04-01: qty 0.6

## 2016-04-01 MED ORDER — ALBUTEROL SULFATE (2.5 MG/3ML) 0.083% IN NEBU
2.5000 mg | INHALATION_SOLUTION | RESPIRATORY_TRACT | Status: DC | PRN
  Administered 2016-04-02: 2.5 mg via RESPIRATORY_TRACT
  Filled 2016-04-01: qty 3

## 2016-04-01 MED ORDER — NITROGLYCERIN 0.4 MG SL SUBL
0.4000 mg | SUBLINGUAL_TABLET | SUBLINGUAL | Status: DC | PRN
  Administered 2016-04-01 (×2): 0.4 mg via SUBLINGUAL
  Filled 2016-04-01: qty 1

## 2016-04-01 MED ORDER — LOSARTAN POTASSIUM 50 MG PO TABS: 100.0000 mg | ORAL_TABLET | ORAL | Status: DC

## 2016-04-01 MED ORDER — GABAPENTIN 100 MG PO CAPS
100.0000 mg | ORAL_CAPSULE | Freq: Three times a day (TID) | ORAL | Status: DC
  Administered 2016-04-01 – 2016-04-02 (×3): 100 mg via ORAL
  Filled 2016-04-01 (×3): qty 1

## 2016-04-01 MED ORDER — TIZANIDINE HCL 4 MG PO TABS: 4.0000 mg | ORAL_TABLET | Freq: Three times a day (TID) | ORAL | Status: DC | PRN

## 2016-04-01 MED ORDER — ALBUTEROL SULFATE HFA 108 (90 BASE) MCG/ACT IN AERS: 2.0000 | INHALATION_SPRAY | Freq: Four times a day (QID) | RESPIRATORY_TRACT | Status: DC | PRN

## 2016-04-01 MED ORDER — IPRATROPIUM BROMIDE 0.02 % IN SOLN: 0.5000 mg | Freq: Four times a day (QID) | RESPIRATORY_TRACT | Status: DC | PRN

## 2016-04-01 MED ORDER — SODIUM CHLORIDE 0.9 % IV SOLN
INTRAVENOUS | Status: AC
  Administered 2016-04-01: 18:00:00 via INTRAVENOUS

## 2016-04-01 MED ORDER — METFORMIN HCL 500 MG PO TABS
500.0000 mg | ORAL_TABLET | Freq: Two times a day (BID) | ORAL | Status: DC
  Administered 2016-04-01 – 2016-04-02 (×2): 500 mg via ORAL
  Filled 2016-04-01 (×2): qty 1

## 2016-04-01 MED ORDER — MORPHINE SULFATE (PF) 2 MG/ML IV SOLN
2.0000 mg | INTRAVENOUS | Status: DC | PRN
  Administered 2016-04-02: 2 mg via INTRAVENOUS
  Filled 2016-04-01: qty 1

## 2016-04-01 MED ORDER — LOSARTAN POTASSIUM 50 MG PO TABS
100.0000 mg | ORAL_TABLET | Freq: Every day | ORAL | Status: DC
  Administered 2016-04-02: 100 mg via ORAL
  Filled 2016-04-01: qty 2

## 2016-04-01 MED ORDER — ISOSORBIDE MONONITRATE ER 60 MG PO TB24
30.0000 mg | ORAL_TABLET | Freq: Every day | ORAL | Status: DC
  Administered 2016-04-02: 30 mg via ORAL
  Filled 2016-04-01: qty 1

## 2016-04-01 NOTE — ED Notes (Signed)
Hospitalist in with pt

## 2016-04-01 NOTE — ED Provider Notes (Signed)
CSN: [id number]     Arrival date & time 04/01/16  1402 History   First MD Initiated Contact with Patient 04/01/16 1414     Chief Complaint  Patient presents with  . Chest Pain      HPI Pt was seen at 1415. Per pt, c/o gradual onset and persistence of 2 episodes of left sided chest "pain" that began at 0800 this morning. Describes the CP as "tightness" has been associated with SOB and DOE. Pt states she took her own SL ntg this morning with relief of the discomfort. Pt states her symptoms came back PTA, so she came to the ED for evaluation. Pt did not take any more of her SL ntg. Pt also c/o chronic watery diarrhea over the past year, worse over the past several weeks, as well as acute flair of her chronic LBP for the past several years, worse over the past several months (pt has been evaluated by her Pain Management doctor for these complaints, with last visit 03/21/16). Denies palpitations, no cough, no abd pain, no N/V, no black or blood in stools, no fevers, no calf pain or unilateral swelling.    Past Medical History  Diagnosis Date  . CHF (congestive heart failure) (HCC)     Normal left ventricular systolic function, ejection fraction 55-60% 07/2007 by cath.  . Diabetes mellitus   . Sleep apnea     CPAP use  . Pulmonary HTN ([place])     [place] 2008:  RA pressures 13/10 with a mean of 7 mmHg.  RV pressure 123456 with end diastolic pressure of 15 mmHg.  PA pressure 49/23 with a mean of 35 mmHg.  Pulmonary capillary wedge 17/50 with a mean of 14 mmHg. The PA saturation was 68%.  RA saturation was 71% and aortic saturation was 90%.  Cardiac output was 6.0 with a cardiac index of 2.80 by Fick.  Normal coronaries by cath 2008.  . Morbid obesity ([place])     s/p lap band surgery  . Asthma   . Arthritis   . Hypertriglyceridemia   . COPD (chronic obstructive pulmonary disease) ([place])   . IBS (irritable bowel syndrome)   . Renal insufficiency   . Chronic back pain   . Chronic hip pain   . Sciatica of  right side   . Uterine fibroid   . Internal hemorrhoids   . Hot flashes 10/06/2013  . Fibroid 10/13/2013  . Adnexal cyst 10/13/2013    Right simple cyst seen on [place]  . Herpes simplex virus (HSV) infection   . Chronic leg pain     right   Past Surgical History  Procedure Laterality Date  . Laparoscopic cholecystectomy  1985  . Laparoscopic gastric banding  12/12/2010  . Pilonidal cyst excision  1974  . Tumor excision  10/2011    left thigh  . Tumor excision  10/2011    on face  . Colonoscopy  10/18/10    SLF:2-mm sessile sigmoid polyp/diverticula  . Hemorrhoid banding  07/2012    Dr. Oneida Alar  . Cardiac catheterization  07/2007, 05/2012    normal coronary arteries  . Left and right heart catheterization with coronary angiogram N/A 06/03/2012    Procedure: LEFT AND RIGHT HEART CATHETERIZATION WITH CORONARY ANGIOGRAM;  Surgeon: Jolaine Artist, MD;  Location: [facility] CATH LAB;  Service: Cardiovascular;  Laterality: N/A;   Family History  Problem Relation Age of Onset  . Breast cancer Paternal Aunt   . Diabetes Mother   . Heart  failure Mother   . Breast cancer Sister   . CAD Father   . Hypertension Father   . Cancer Paternal Grandfather   . Hypertension Paternal Grandmother   . Stroke Paternal Grandmother   . Other Maternal Grandmother     tonsilitis  . Diabetes Maternal Grandfather   . Hypertension Maternal Grandfather    Social History  Substance Use Topics  . Smoking status: Former Smoker    Types: Cigarettes    Quit date: 08/08/1996  . Smokeless tobacco: Never Used  . Alcohol Use: No   OB History    Gravida Para Term Preterm AB TAB SAB Ectopic Multiple Living   0 0 0 0 0 0 0 0 0 0      Review of Systems ROS: Statement: All systems negative except as marked or noted in the HPI; Constitutional: Negative for fever and chills. ; ; Eyes: Negative for eye pain, redness and discharge. ; ; ENMT: Negative for ear pain, hoarseness, nasal congestion, sinus pressure and sore  throat. ; ; Cardiovascular: +CP, SOB. Negative for palpitations, diaphoresis, and peripheral edema. ; ; Respiratory: Negative for cough, wheezing and stridor. ; ; Gastrointestinal: +chronic diarrhea. Negative for nausea, vomiting, abdominal pain, blood in stool, hematemesis, jaundice and rectal bleeding. . ; ; Genitourinary: Negative for dysuria, flank pain and hematuria. ; ; Musculoskeletal: Negative for back pain and neck pain. Negative for swelling and trauma.; ; Skin: Negative for pruritus, rash, abrasions, blisters, bruising and skin lesion.; ; Neuro: Negative for headache, lightheadedness and neck stiffness. Negative for weakness, altered level of consciousness , altered mental status, extremity weakness, paresthesias, involuntary movement, seizure and syncope.      Allergies  Aspirin; Bactrim; Ceftin; Ciprofloxacin; and Latex  Home Medications   Prior to Admission medications   Medication Sig Start Date End Date Taking? Authorizing Provider  albuterol (PROVENTIL HFA;VENTOLIN HFA) 108 (90 BASE) MCG/ACT inhaler Inhale 2 puffs into the lungs every 6 (six) hours as needed. FOR SHORTNESS OF BREATH/WHEEZING   Yes Historical Provider, MD  albuterol (PROVENTIL) (2.5 MG/3ML) 0.083% nebulizer solution  09/19/15  Yes Historical Provider, MD  ALPRAZolam (XANAX) 0.5 MG tablet Take 0.5 mg by mouth 3 (three) times daily as needed. For anxiety/sleep 11/24/14  Yes Historical Provider, MD  amLODipine (NORVASC) 10 MG tablet Take 1 tablet by mouth every evening.  04/10/13  Yes Historical Provider, MD  beclomethasone (QVAR) 80 MCG/ACT inhaler Inhale 2 puffs into the lungs 2 (two) times daily as needed. Reported on 03/21/2016   Yes Historical Provider, MD  dicyclomine (BENTYL) 10 MG capsule TAKE 1 TO 2 CAPSULES BEFORE MEALS AND AT BEDTIME 09/21/15  Yes Carlis Stable, NP  fenofibrate 160 MG tablet Take 160 mg by mouth daily. 07/21/15  Yes Historical Provider, MD  fluticasone (FLONASE) 50 MCG/ACT nasal spray Place 1-2  sprays into the nose daily.    Yes Historical Provider, MD  furosemide (LASIX) 40 MG tablet Take 40 mg by mouth daily as needed. 11/04/14  Yes Historical Provider, MD  gabapentin (NEURONTIN) 100 MG capsule Take 1 capsule (100 mg total) by mouth 3 (three) times daily. 03/08/16  Yes Meredith Staggers, MD  ipratropium (ATROVENT) 0.02 % nebulizer solution Take 0.5 mg by nebulization every 6 (six) hours as needed.  09/19/15  Yes Historical Provider, MD  isosorbide mononitrate (IMDUR) 30 MG 24 hr tablet Take 30 mg by mouth every morning.  09/05/11  Yes Historical Provider, MD  loratadine (CLARITIN) 10 MG tablet Take 10 mg by mouth  every morning.    Yes Historical Provider, MD  losartan (COZAAR) 100 MG tablet Take 100 mg by mouth every morning.    Yes Historical Provider, MD  metFORMIN (GLUCOPHAGE) 500 MG tablet Take 1 tablet by mouth 2 (two) times daily. 02/13/13  Yes Historical Provider, MD  nitroGLYCERIN (NITROSTAT) 0.4 MG SL tablet Place 0.4 mg under the tongue every 5 (five) minutes as needed. Chest Pains   Yes Historical Provider, MD  potassium chloride SA (K-DUR,KLOR-CON) 20 MEQ tablet Take 20 mEq by mouth every morning.    Yes Historical Provider, MD  predniSONE (DELTASONE) 10 MG tablet Reports taken as needed 07/01/15  Yes Historical Provider, MD  tiZANidine (ZANAFLEX) 4 MG tablet Take 4 mg by mouth every 8 (eight) hours as needed for muscle spasms.   Yes Historical Provider, MD  traMADol (ULTRAM) 50 MG tablet take 1 tablet by mouth four times a day if needed 05/24/15  Yes [employer], MD  triamcinolone cream (KENALOG) 0.1 % apply to affected area twice a day 05/24/15  Yes Historical Provider, MD  predniSONE (DELTASONE) 20 MG tablet Take 1 tablet (20 mg total) by mouth as directed. Take 1 tab 3x daily for 4 days then 1 tab 2x daily for 4 days then 1 tab once daily for 4 days, then 1/2 tab daily for 4 days and off. Patient not taking: Reported on 04/01/2016 03/21/16   Meredith Staggers, MD   BP 122/95 mmHg   Pulse 112  Temp(Src) 98.1 F (36.7 C) (Oral)  Resp 20  Ht 4\' 11"  (1.499 m)  Wt 270 lb (122.471 kg)  BMI 54.50 kg/m2  SpO2 95%  LMP 06/06/2013 Physical Exam  1420: Physical examination:  Nursing notes reviewed; Vital signs and O2 SAT reviewed;  Constitutional: Well developed, Well nourished, Well hydrated, In no acute distress; Head:  Normocephalic, atraumatic; Eyes: EOMI, PERRL, No scleral icterus; ENMT: Mouth and pharynx normal, Mucous membranes moist; Neck: Supple, Full range of motion, No lymphadenopathy; Cardiovascular: Tachycardic rate and rhythm, No gallop; Respiratory: Breath sounds clear & equal bilaterally, No wheezes.  Speaking full sentences with ease, Normal respiratory effort/excursion; Chest: Nontender, Movement normal; Abdomen: Soft, Nontender, Nondistended, Normal bowel sounds; Genitourinary: No CVA tenderness; Extremities: Pulses normal, No tenderness, No edema, No calf edema or asymmetry.; Neuro: AA&Ox3, Major CN grossly intact.  Speech clear. No gross focal motor or sensory deficits in extremities.; Skin: Color normal, Warm, Dry.   ED Course  Procedures (including critical care time) Labs Review  Imaging Review  I have personally reviewed and evaluated these images and lab results as part of my medical decision-making.   EKG Interpretation   Date/Time:  Sunday April 01 2016 14:12:45 EDT Ventricular Rate:  110 PR Interval:  119 QRS Duration: 77 QT Interval:  472 QTC Calculation: 639 R Axis:   -35 Text Interpretation:  Sinus tachycardia Abnormal R-wave progression, early  transition Probable LVH with secondary repol abnrm Prolonged QT interval  When compared with ECG of 09/09/2013 QT has lengthened Confirmed by [facility]   MD, Nunzio Cory [phone]) on 04/01/2016 2:42:27 PM      MDM  MDM Reviewed: previous chart, nursing note and vitals Reviewed previous: labs and ECG Interpretation: labs, ECG and x-ray      Results for orders placed or performed during the  hospital encounter of 04/01/16  CBC with Differential  Result Value Ref Range   WBC 12.2 (H) 4.0 - 10.5 K/uL   RBC 3.90 3.87 - 5.11 MIL/uL   Hemoglobin 12.3  12.0 - 15.0 g/dL   HCT 36.7 36.0 - 46.0 %   MCV 94.1 78.0 - 100.0 fL   MCH 31.5 26.0 - 34.0 pg   MCHC 33.5 30.0 - 36.0 g/dL   RDW 13.1 11.5 - 15.5 %   Platelets 349 150 - 400 K/uL   Neutrophils Relative % 76 %   Neutro Abs 9.3 (H) 1.7 - 7.7 K/uL   Lymphocytes Relative 15 %   Lymphs Abs 1.9 0.7 - 4.0 K/uL   Monocytes Relative 6 %   Monocytes Absolute 0.7 0.1 - 1.0 K/uL   Eosinophils Relative 3 %   Eosinophils Absolute 0.4 0.0 - 0.7 K/uL   Basophils Relative 0 %   Basophils Absolute 0.0 0.0 - 0.1 K/uL  Troponin I  Result Value Ref Range   Troponin I <0.03 <0.031 ng/mL  Comprehensive metabolic panel  Result Value Ref Range   Sodium 140 135 - 145 mmol/L   Potassium 4.0 3.5 - 5.1 mmol/L   Chloride 108 101 - 111 mmol/L   CO2 21 (L) 22 - 32 mmol/L   Glucose, Bld 190 (H) 65 - 99 mg/dL   BUN 9 6 - 20 mg/dL   Creatinine, Ser 1.07 (H) 0.44 - 1.00 mg/dL   Calcium 9.1 8.9 - 10.3 mg/dL   Total Protein 7.9 6.5 - 8.1 g/dL   Albumin 3.9 3.5 - 5.0 g/dL   AST 32 15 - 41 U/L   ALT 23 14 - 54 U/L   Alkaline Phosphatase 48 38 - 126 U/L   Total Bilirubin 0.5 0.3 - 1.2 mg/dL   GFR calc non Af Amer 56 (L) >60 mL/min   GFR calc Af Amer >60 >60 mL/min   Anion gap 11 5 - 15  Lipase, blood  Result Value Ref Range   Lipase 19 11 - 51 U/L  Brain natriuretic peptide  Result Value Ref Range   B Natriuretic Peptide 24.0 0.0 - 100.0 pg/mL  Magnesium  Result Value Ref Range   Magnesium 1.3 (L) 1.7 - 2.4 mg/dL   Dg Chest Portable 1 View 04/01/2016  CLINICAL DATA:  Chest pain since 8 a.m., pain down left arm. EXAM: PORTABLE CHEST 1 VIEW COMPARISON:  Chest x-rays dated 09/09/2013 11/17/2012. FINDINGS: Heart size is upper normal. Right lung base is difficult to characterize due to body habitus but lungs appear grossly clear throughout. No evidence  of pneumonia. No pleural effusion or pneumothorax seen. Osseous structures about the chest are unremarkable. IMPRESSION: No evidence of acute cardiopulmonary abnormality. Electronically Signed   By: Franki Cabot M.D.   On: 04/01/2016 14:57    1530:  Feels improved after SL ntg and morphine (pt states she is allergic to ASA). EKG with prolonged QT, and magnesium level low; will dose IV magnesium. Dx and testing d/w pt.  Questions answered.  Verb understanding, agreeable to admit. T/C to [facility] Dr. Nehemiah Settle, case discussed, including:  HPI, pertinent PM/SHx, VS/PE, dx testing, ED course and treatment:  Agreeable to admit, requests to write temporary orders, obtain observation tele bed to team APAdmits.   Francine Graven, DO 04/04/16 1113

## 2016-04-01 NOTE — Progress Notes (Signed)
Pharmacy Dosing Clarification  Pharmacy Consult for lovenox for DVT pxl  Allergies  Allergen Reactions  . Aspirin Other (See Comments)    Rectal bleeding  . Bactrim [Sulfamethoxazole-Trimethoprim] Other (See Comments)    Cramping,bloating,vomiting,fever,headaches  . Ceftin [Cefuroxime Axetil] Swelling    Swollen tongue, "made me crazy"  . Ciprofloxacin Cough    Coughed up blood  . Latex Itching and Rash    Patient Measurements: Height: 4\' 11"  (149.9 cm) Weight: 270 lb (122.471 kg) IBW/kg (Calculated) : 43.2   Vital Signs: Temp: 98.1 F (36.7 C) (04/02 1413) Temp Source: Oral (04/02 1413) BP: 122/64 mmHg (04/02 1502) Pulse Rate: 102 (04/02 1502)  Labs:  Recent Labs  04/01/16 1415  HGB 12.3  HCT 36.7  PLT 349  CREATININE 1.07*  TROPONINI <0.03    Estimated Creatinine Clearance: 67.8 mL/min (by C-G formula based on Cr of 1.07).   Medical History: Past Medical History  Diagnosis Date  . CHF (congestive heart failure) (HCC)     Normal left ventricular systolic function, ejection fraction 55-60% 07/2007 by cath.  . Diabetes mellitus   . Sleep apnea     CPAP use  . Pulmonary HTN ([place])     [place] 2008:  RA pressures 13/10 with a mean of 7 mmHg.  RV pressure 123456 with end diastolic pressure of 15 mmHg.  PA pressure 49/23 with a mean of 35 mmHg.  Pulmonary capillary wedge 17/50 with a mean of 14 mmHg. The PA saturation was 68%.  RA saturation was 71% and aortic saturation was 90%.  Cardiac output was 6.0 with a cardiac index of 2.80 by Fick.  Normal coronaries by cath 2008.  . Morbid obesity ([place])     s/p lap band surgery  . Asthma   . Arthritis   . Hypertriglyceridemia   . COPD (chronic obstructive pulmonary disease) ([place])   . IBS (irritable bowel syndrome)   . Renal insufficiency   . Chronic back pain   . Chronic hip pain   . Sciatica of right side   . Uterine fibroid   . Internal hemorrhoids   . Hot flashes 10/06/2013  . Fibroid 10/13/2013  . Adnexal cyst  10/13/2013    Right simple cyst seen on [place]  . Herpes simplex virus (HSV) infection   . Chronic leg pain     right    Medications:  Prescriptions prior to admission  Medication Sig Dispense Refill Last Dose  . albuterol (PROVENTIL HFA;VENTOLIN HFA) 108 (90 BASE) MCG/ACT inhaler Inhale 2 puffs into the lungs every 6 (six) hours as needed. FOR SHORTNESS OF BREATH/WHEEZING   03/31/2016 at Unknown time  . albuterol (PROVENTIL) (2.5 MG/3ML) 0.083% nebulizer solution    04/01/2016 at Unknown time  . ALPRAZolam (XANAX) 0.5 MG tablet Take 0.5 mg by mouth 3 (three) times daily as needed. For anxiety/sleep   03/31/2016 at Unknown time  . amLODipine (NORVASC) 10 MG tablet Take 1 tablet by mouth every evening.    03/31/2016 at Unknown time  . beclomethasone (QVAR) 80 MCG/ACT inhaler Inhale 2 puffs into the lungs 2 (two) times daily as needed. Reported on 03/21/2016   unknown  . dicyclomine (BENTYL) 10 MG capsule TAKE 1 TO 2 CAPSULES BEFORE MEALS AND AT BEDTIME 62 capsule 2 04/01/2016 at Unknown time  . fenofibrate 160 MG tablet Take 160 mg by mouth daily.  0 unknown  . fluticasone (FLONASE) 50 MCG/ACT nasal spray Place 1-2 sprays into the nose daily.    03/31/2016 at Unknown  time  . furosemide (LASIX) 40 MG tablet Take 40 mg by mouth daily as needed.  0 Past Week at Unknown time  . gabapentin (NEURONTIN) 100 MG capsule Take 1 capsule (100 mg total) by mouth 3 (three) times daily. 90 capsule 3 Past Week at Unknown time  . ipratropium (ATROVENT) 0.02 % nebulizer solution Take 0.5 mg by nebulization every 6 (six) hours as needed.    04/01/2016 at Unknown time  . isosorbide mononitrate (IMDUR) 30 MG 24 hr tablet Take 30 mg by mouth every morning.    04/01/2016 at Unknown time  . loratadine (CLARITIN) 10 MG tablet Take 10 mg by mouth every morning.    04/01/2016 at Unknown time  . losartan (COZAAR) 100 MG tablet Take 100 mg by mouth every morning.    04/01/2016 at Unknown time  . metFORMIN (GLUCOPHAGE) 500 MG tablet Take 1 tablet  by mouth 2 (two) times daily.   04/01/2016 at Unknown time  . nitroGLYCERIN (NITROSTAT) 0.4 MG SL tablet Place 0.4 mg under the tongue every 5 (five) minutes as needed. Chest Pains   unknown  . potassium chloride SA (K-DUR,KLOR-CON) 20 MEQ tablet Take 20 mEq by mouth every morning.    04/01/2016 at Unknown time  . predniSONE (DELTASONE) 10 MG tablet Reports taken as needed  0 unknown  . tiZANidine (ZANAFLEX) 4 MG tablet Take 4 mg by mouth every 8 (eight) hours as needed for muscle spasms.   Past Week at Unknown time  . traMADol (ULTRAM) 50 MG tablet take 1 tablet by mouth four times a day if needed  0 04/01/2016 at Unknown time  . triamcinolone cream (KENALOG) 0.1 % apply to affected area twice a day  0 04/01/2016 at Unknown time  . predniSONE (DELTASONE) 20 MG tablet Take 1 tablet (20 mg total) by mouth as directed. Take 1 tab 3x daily for 4 days then 1 tab 2x daily for 4 days then 1 tab once daily for 4 days, then 1/2 tab daily for 4 days and off. (Patient not taking: Reported on 04/01/2016) 26 tablet 0     Assessment: 60 yo F admitted with CP.  MD ordered lovenox for DVT pxl.  Pt with BMI >30.   Goal of Therapy:  DVT Pxl   Plan:  -Adjust lovenox to 0.5 mg/kg q24h for DVT pxl in this patient with CrCr>30 ml/min and BMI >30 - Lovenox 60 mg sq q24h - Further adjustments per MD  Vonda Antigua 04/01/2016,5:34 PM
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
## 2016-04-01 NOTE — H&P (Signed)
History and Physical  Sara Tran [id number] DOB: 05-Aug-1956 DOA: 04/01/2016  Referring physician: Dr Thurnell Garbe, ED physician PCP: Purvis Kilts, MD   Chief Complaint: Chest pain  HPI: Sara Tran is a 60 y.o. female  With a history of grade 1 diastolic heart failure with an EF of 55-60% in January 2016, MI 3, diabetes mellitus, pulmonary hypertension, IBS, COPD/asthma, morbid obesity, chronic pain. Patient seen today for chest pain that started this morning when she woke up. Patient experienced a shooting pain in her chest and was left with a heaviness in her left chest. No diaphoresis, shortness of breath, nausea. The patient took nitroglycerin which improved the pain. Pain returned this afternoon after church. Patient came here for evaluation. The pain is not worse with movement or breathing. She states that this pain is similar to her previous.   Review of Systems:   Pt denies any fevers, chills, nausea, vomiting, diarrhea, constipation, abdominal pain, shortness of breath, dyspnea on exertion, orthopnea, cough, wheezing, palpitations, headache, vision changes, lightheadedness, dizziness, diarrhea, constipation, melena, rectal bleeding.  Review of systems are otherwise negative  Past Medical History  Diagnosis Date  . CHF (congestive heart failure) (HCC)     Normal left ventricular systolic function, ejection fraction 55-60% 07/2007 by cath.  . Diabetes mellitus   . Sleep apnea     CPAP use  . Pulmonary HTN ([place])     [place] 2008:  RA pressures 13/10 with a mean of 7 mmHg.  RV pressure 123456 with end diastolic pressure of 15 mmHg.  PA pressure 49/23 with a mean of 35 mmHg.  Pulmonary capillary wedge 17/50 with a mean of 14 mmHg. The PA saturation was 68%.  RA saturation was 71% and aortic saturation was 90%.  Cardiac output was 6.0 with a cardiac index of 2.80 by Fick.  Normal coronaries by cath 2008.  . Morbid obesity ([place])     s/p lap band surgery  . Asthma   . Arthritis     . Hypertriglyceridemia   . COPD (chronic obstructive pulmonary disease) ([place])   . IBS (irritable bowel syndrome)   . Renal insufficiency   . Chronic back pain   . Chronic hip pain   . Sciatica of right side   . Uterine fibroid   . Internal hemorrhoids   . Hot flashes 10/06/2013  . Fibroid 10/13/2013  . Adnexal cyst 10/13/2013    Right simple cyst seen on [place]  . Herpes simplex virus (HSV) infection   . Chronic leg pain     right   Past Surgical History  Procedure Laterality Date  . Laparoscopic cholecystectomy  1985  . Laparoscopic gastric banding  12/12/2010  . Pilonidal cyst excision  1974  . Tumor excision  10/2011    left thigh  . Tumor excision  10/2011    on face  . Colonoscopy  10/18/10    SLF:2-mm sessile sigmoid polyp/diverticula  . Hemorrhoid banding  07/2012    Dr. Oneida Alar  . Cardiac catheterization  07/2007, 05/2012    normal coronary arteries  . Left and right heart catheterization with coronary angiogram N/A 06/03/2012    Procedure: LEFT AND RIGHT HEART CATHETERIZATION WITH CORONARY ANGIOGRAM;  Surgeon: Jolaine Artist, MD;  Location[facility] C[facility] LAB;  Service: Cardiovascular;  Laterality: N/A;   Social History:  reports that she quit smoking about 19 years ago. Her smoking use included Cigarettes. She has never used smokeless tobacco. She reports that she does not  drink alcohol or use illicit drugs. Patient lives at home & is able to participate in activities of daily living  Allergies  Allergen Reactions  . Aspirin Other (See Comments)    Rectal bleeding  . Bactrim [Sulfamethoxazole-Trimethoprim] Other (See Comments)    Cramping,bloating,vomiting,fever,headaches  . Ceftin [Cefuroxime Axetil] Swelling    Swollen tongue, "made me crazy"  . Ciprofloxacin Cough    Coughed up blood  . Latex Itching and Rash    Family History  Problem Relation Age of Onset  . Breast cancer Paternal Aunt   . Diabetes Mother   . Heart failure Mother   . Breast cancer Sister    . CAD Father   . Hypertension Father   . Cancer Paternal Grandfather   . Hypertension Paternal Grandmother   . Stroke Paternal Grandmother   . Other Maternal Grandmother     tonsilitis  . Diabetes Maternal Grandfather   . Hypertension Maternal Grandfather      Prior to Admission medications   Medication Sig Start Date End Date Taking? Authorizing Provider  albuterol (PROVENTIL HFA;VENTOLIN HFA) 108 (90 BASE) MCG/ACT inhaler Inhale 2 puffs into the lungs every 6 (six) hours as needed. FOR SHORTNESS OF BREATH/WHEEZING   Yes Historical Provider, MD  albuterol (PROVENTIL) (2.5 MG/3ML) 0.083% nebulizer solution  09/19/15  Yes Historical Provider, MD  ALPRAZolam (XANAX) 0.5 MG tablet Take 0.5 mg by mouth 3 (three) times daily as needed. For anxiety/sleep 11/24/14  Yes Historical Provider, MD  amLODipine (NORVASC) 10 MG tablet Take 1 tablet by mouth every evening.  04/10/13  Yes Historical Provider, MD  beclomethasone (QVAR) 80 MCG/ACT inhaler Inhale 2 puffs into the lungs 2 (two) times daily as needed. Reported on 03/21/2016   Yes Historical Provider, MD  dicyclomine (BENTYL) 10 MG capsule TAKE 1 TO 2 CAPSULES BEFORE MEALS AND AT BEDTIME 09/21/15  Yes Carlis Stable, NP  fenofibrate 160 MG tablet Take 160 mg by mouth daily. 07/21/15  Yes Historical Provider, MD  fluticasone (FLONASE) 50 MCG/ACT nasal spray Place 1-2 sprays into the nose daily.    Yes Historical Provider, MD  furosemide (LASIX) 40 MG tablet Take 40 mg by mouth daily as needed. 11/04/14  Yes Historical Provider, MD  gabapentin (NEURONTIN) 100 MG capsule Take 1 capsule (100 mg total) by mouth 3 (three) times daily. 03/08/16  Yes Meredith Staggers, MD  ipratropium (ATROVENT) 0.02 % nebulizer solution Take 0.5 mg by nebulization every 6 (six) hours as needed.  09/19/15  Yes Historical Provider, MD  isosorbide mononitrate (IMDUR) 30 MG 24 hr tablet Take 30 mg by mouth every morning.  09/05/11  Yes Historical Provider, MD  loratadine (CLARITIN) 10 MG  tablet Take 10 mg by mouth every morning.    Yes Historical Provider, MD  losartan (COZAAR) 100 MG tablet Take 100 mg by mouth every morning.    Yes Historical Provider, MD  metFORMIN (GLUCOPHAGE) 500 MG tablet Take 1 tablet by mouth 2 (two) times daily. 02/13/13  Yes Historical Provider, MD  nitroGLYCERIN (NITROSTAT) 0.4 MG SL tablet Place 0.4 mg under the tongue every 5 (five) minutes as needed. Chest Pains   Yes Historical Provider, MD  potassium chloride SA (K-DUR,KLOR-CON) 20 MEQ tablet Take 20 mEq by mouth every morning.    Yes Historical Provider, MD  predniSONE (DELTASONE) 10 MG tablet Reports taken as needed 07/01/15  Yes Historical Provider, MD  tiZANidine (ZANAFLEX) 4 MG tablet Take 4 mg by mouth every 8 (eight) hours as needed for  muscle spasms.   Yes Historical Provider, MD  traMADol (ULTRAM) 50 MG tablet take 1 tablet by mouth four times a day if needed 05/24/15  Yes [employer], MD  triamcinolone cream (KENALOG) 0.1 % apply to affected area twice a day 05/24/15  Yes Historical Provider, MD  predniSONE (DELTASONE) 20 MG tablet Take 1 tablet (20 mg total) by mouth as directed. Take 1 tab 3x daily for 4 days then 1 tab 2x daily for 4 days then 1 tab once daily for 4 days, then 1/2 tab daily for 4 days and off. Patient not taking: Reported on 04/01/2016 03/21/16   Meredith Staggers, MD    Physical Exam: BP 122/64 mmHg  Pulse 102  Temp(Src) 98.1 F (36.7 C) (Oral)  Resp 18  Ht 4\' 11"  (1.499 m)  Wt 122.471 kg (270 lb)  BMI 54.50 kg/m2  SpO2 99%  LMP 06/06/2013  General: Middle-aged black female. Awake and alert and oriented x3. No acute cardiopulmonary distress.  Eyes: Pupils equal, round, reactive to light. Extraocular muscles are intact. Sclerae anicteric and noninjected.  ENT:  Moist mucosal membranes. No mucosal lesions. Neck: Neck supple without lymphadenopathy. No carotid bruits. No masses palpated.  Cardiovascular: Regular rate with normal S1-S2 sounds. No murmurs, rubs,  gallops auscultated. No JVD.  Respiratory: Good respiratory effort with no wheezes, rales, rhonchi. Lungs clear to auscultation bilaterally.  Abdomen: Obese. Soft, nontender, nondistended. Active bowel sounds. No masses or hepatosplenomegaly  Skin: Dry, warm to touch. 2+ dorsalis pedis and radial pulses. Musculoskeletal: No calf or leg pain. All major joints not erythematous nontender. Mild tenderness to the anterior chest palpation Psychiatric: Intact judgment and insight.  Neurologic: No focal neurological deficits. Cranial nerves II through XII are grossly intact.           Labs on Admission:  Basic Metabolic Panel:  Recent Labs Lab 04/01/16 1415  NA 140  K 4.0  CL 108  CO2 21*  GLUCOSE 190*  BUN 9  CREATININE 1.07*  CALCIUM 9.1  MG 1.3*   Liver Function Tests:  Recent Labs Lab 04/01/16 1415  AST 32  ALT 23  ALKPHOS 48  BILITOT 0.5  PROT 7.9  ALBUMIN 3.9    Recent Labs Lab 04/01/16 1415  LIPASE 19   No results for input(s): AMMONIA in the last 168 hours. CBC:  Recent Labs Lab 04/01/16 1415  WBC 12.2*  NEUTROABS 9.3*  HGB 12.3  HCT 36.7  MCV 94.1  PLT 349   Cardiac Enzymes:  Recent Labs Lab 04/01/16 1415  TROPONINI <0.03    BNP (last 3 results)  Recent Labs  04/01/16 1415  BNP 24.0    ProBNP (last 3 results) No results for input(s): PROBNP in the last 8760 hours.  CBG: No results for input(s): GLUCAP in the last 168 hours.  Radiological Exams on Admission: Dg Chest Portable 1 View  04/01/2016  CLINICAL DATA:  Chest pain since 8 a.m., pain down left arm. EXAM: PORTABLE CHEST 1 VIEW COMPARISON:  Chest x-rays dated 09/09/2013 11/17/2012. FINDINGS: Heart size is upper normal. Right lung base is difficult to characterize due to body habitus but lungs appear grossly clear throughout. No evidence of pneumonia. No pleural effusion or pneumothorax seen. Osseous structures about the chest are unremarkable. IMPRESSION: No evidence of acute  cardiopulmonary abnormality. Electronically Signed   By: Franki Cabot M.D.   On: 04/01/2016 14:57    EKG: Independently reviewed. Sinus tachycardia. Prolonged QTC. No acute ST elevation or depression  Assessment/Plan Present on Admission:  . Chest pain . Morbid obesity ([place])  This patient was discussed with the ED physician, including pertinent vitals, physical exam findings, labs, and imaging.  We also discussed care given by the ED provider.  #1 chest pain  Admits to telemetry  Rule out with troponins  Morphine and nitroglycerin when necessary pain  Cardiology consult #2 diabetes  Cardiac carb modified diet  CBG before meals and daily at bedtime  Sign scale insulin  Cover metformin #3 morbid obesity #4 coronary artery disease   DVT prophylaxis: Lovenox  Consultants: Will consult cardiology  Code Status: Full code  Family Communication: None   Disposition Plan: Admission to telemetry   Truett Mainland, DO [facility] Pager [phone]

## 2016-04-02 ENCOUNTER — Encounter (HOSPITAL_COMMUNITY): Payer: Self-pay | Admitting: Cardiology

## 2016-04-02 ENCOUNTER — Ambulatory Visit (HOSPITAL_COMMUNITY): Payer: Commercial Managed Care - HMO | Admitting: Physical Therapy

## 2016-04-02 DIAGNOSIS — I272 Other secondary pulmonary hypertension: Secondary | ICD-10-CM

## 2016-04-02 DIAGNOSIS — E661 Drug-induced obesity: Secondary | ICD-10-CM

## 2016-04-02 DIAGNOSIS — R079 Chest pain, unspecified: Secondary | ICD-10-CM | POA: Diagnosis not present

## 2016-04-02 DIAGNOSIS — R0789 Other chest pain: Secondary | ICD-10-CM | POA: Diagnosis not present

## 2016-04-02 LAB — GLUCOSE, CAPILLARY
Glucose-Capillary: 144 mg/dL — ABNORMAL HIGH (ref 65–99)
Glucose-Capillary: 161 mg/dL — ABNORMAL HIGH (ref 65–99)

## 2016-04-02 LAB — TROPONIN I: Troponin I: <0.03 ng/mL (ref <0.031)

## 2016-04-02 MED ORDER — LOPERAMIDE HCL 2 MG PO CAPS
2.0000 mg | ORAL_CAPSULE | ORAL | Status: DC | PRN
  Administered 2016-04-02: 2 mg via ORAL
  Filled 2016-04-02: qty 1

## 2016-04-02 MED ORDER — PANTOPRAZOLE SODIUM 40 MG PO TBEC: 40.0000 mg | DELAYED_RELEASE_TABLET | Freq: Every day | ORAL | Status: DC

## 2016-04-02 MED ORDER — LOPERAMIDE HCL 2 MG PO CAPS: 2.0000 mg | ORAL_CAPSULE | ORAL | Status: DC | PRN

## 2016-04-02 NOTE — Consult Note (Signed)
CARDIOLOGY CONSULT NOTE   Patient ID: Sara Tran MRN: [id number] DOB/AGE: September 27, 1956 60 y.o.  Admit Date: 04/01/2016 Referring Physician: Veverly Fells MD-TRH Primary Physician: Sara Kilts, MD Consulting Cardiologist: Sara Tran Primary Cardiologist: Sara Tran (CHF clinic) Reason for Consultation: Chest Pain  Clinical Summary Sara Tran is a morbidly obese 60 y.o.female with history of pulmonary hypertension, type 2 diabetes mellitus, asthma and sleep apnea with CPAP, who was seen last by Sara Tran in 2016 for atypical chest pain that occurs when she wheezes. It was felt that this was related to obesity and deconditioning, along with COPD. She has undergone previous workup in 2013 with cardiac catheterization demonstrating normal coronary arteries and subsequently diagnosed mild pulmonary arterial hypertension.  She states that symptoms began yesterday morning upon awakening. Described a sharp pain which moved across her left breast from left to right. She also states that she has been having significant diarrhea over the last week. She continued diarrhea yesterday morning. She states that she went to church and continued to have some uncomfortable pressure in her left chest, and also had continued diarrhea. She states that she felt weak, tired, and had continued discomfort in her chest and presented to the emergency room. She also states that she has been having some lower extremity edema, which has resolved since being in the hospital.  On arrival to ER, patient's blood pressure was 122/95, heart rate 105, O2 sat 95%, afebrile. Respirations 20. White blood cells were elevated at 12.2. CO2 21, glucose 190, creatinine 1.07. Magnesium 1.3. Chest x-ray revealed no evidence of acute cardiopulmonary abnormality. EKG revealed sinus rhythm, rate of 110 bpm with prolonged QT interval, mild LVH. She was treated with sublingual nitroglycerin and magnesium IV. Troponins  were found to be negative 4.  She states she has continued have diarrhea this morning, and has had recurrent chest discomfort with this. She has describes it as "pretty bad" along with spasms in her legs. She denies any associated diaphoresis, dizziness, or dyspnea. She does occasionally feel a tightness in her chest which she relates to her asthma attacks and she uses inhalers and nebulizer treatments for relief. She did feel better after breathing treatment this morning.   Allergies  Allergen Reactions  . Aspirin Other (See Comments)    Rectal bleeding  . Bactrim [Sulfamethoxazole-Trimethoprim] Other (See Comments)    Cramping,bloating,vomiting,fever,headaches  . Ceftin [Cefuroxime Axetil] Swelling    Swollen tongue, "made me crazy"  . Ciprofloxacin Cough    Coughed up blood  . Latex Itching and Rash    Medications Scheduled Medications: . amLODipine  10 mg Oral QPM  . budesonide  2 mL Inhalation BID  . enoxaparin (LOVENOX) injection  60 mg Subcutaneous Q24H  . fenofibrate  160 mg Oral Daily  . fluticasone  1-2 spray Each Nare Daily  . gabapentin  100 mg Oral TID  . insulin aspart  0-20 Units Subcutaneous TID WC  . insulin aspart  0-5 Units Subcutaneous QHS  . isosorbide mononitrate  30 mg Oral Daily  . loratadine  10 mg Oral q morning - 10a  . losartan  100 mg Oral Daily  . metFORMIN  500 mg Oral BID WC  . pantoprazole  40 mg Oral Daily  . potassium chloride SA  20 mEq Oral q morning - 10a    PRN Medications: acetaminophen, albuterol, ALPRAZolam, dicyclomine, furosemide, gi cocktail, ipratropium, morphine injection, nitroGLYCERIN, ondansetron (ZOFRAN) IV, tiZANidine, traMADol   Past Medical History  Diagnosis Date  . Type 2 diabetes mellitus ([place])   . Sleep apnea     CPAP use  . Pulmonary HTN ([place])     [place] 2008:  RA pressures 13/10 with a mean of 7 mmHg.  RV pressure 123456 with end diastolic pressure of 15 mmHg.  PA pressure 49/23 with a mean of 35 mmHg.  Pulmonary  capillary wedge 17/50 with a mean of 14 mmHg. The PA saturation was 68%.  RA saturation was 71% and aortic saturation was 90%.  Cardiac output was 6.0 with a cardiac index of 2.80 by [place].  Normal coronaries by cath 2008.  . Morbid obesity ([place])     s/p lap band surgery  . Asthma   . Arthritis   . Hypertriglyceridemia   . COPD (chronic obstructive pulmonary disease) ([place])   . IBS (irritable bowel syndrome)   . Renal insufficiency   . Chronic back pain   . Chronic hip pain   . Sciatica of right side   . Uterine fibroid   . Internal hemorrhoids   . Hot flashes   . Adnexal cyst     Right simple cyst seen on [place]  . Herpes simplex virus (HSV) infection   . History of cardiac catheterization     Normal coronaries 2013    Past Surgical History  Procedure Laterality Date  . Laparoscopic cholecystectomy  1985  . Laparoscopic gastric banding  12/12/2010  . Pilonidal cyst excision  1974  . Tumor excision  10/2011    Left thigh  . Tumor excision  10/2011    Face  . Colonoscopy  10/18/10    SLF:2-mm sessile sigmoid polyp/diverticula  . Hemorrhoid banding  07/2012    Dr. Oneida Alar  . Cardiac catheterization  07/2007, 05/2012    Normal coronary arteries  . Left and right heart catheterization with coronary angiogram N/A 06/03/2012    Procedure: LEFT AND RIGHT HEART CATHETERIZATION WITH CORONARY ANGIOGRAM;  Surgeon: Jolaine Artist, MD;  Location: [facility] CATH LAB;  Service: Cardiovascular;  Laterality: N/A;    Family History  Problem Relation Age of Onset  . Breast cancer Paternal Aunt   . Diabetes Mother   . Heart failure Mother   . Breast cancer Sister   . CAD Father   . Hypertension Father   . Cancer Paternal Grandfather   . Hypertension Paternal Grandmother   . Stroke Paternal Grandmother   . Other Maternal Grandmother     tonsilitis  . Diabetes Maternal Grandfather   . Hypertension Maternal Grandfather     Social History Sara Tran reports that she quit smoking about 19 years  ago. Her smoking use included Cigarettes. She has never used smokeless tobacco. Sara Tran reports that she does not drink alcohol.  Review of Systems Complete review of systems are found to be negative unless outlined in H&P above. Reports chronic problems with recurring diarrhea, has seen Dr. Oneida Alar in the past. No fevers or chills. No vomiting.  Physical Examination Blood pressure 138/60, pulse 93, temperature 97.5 F (36.4 C), temperature source Oral, resp. rate 20, height 4\' 11"  (1.499 m), weight 270 lb (122.471 kg), last menstrual period 06/06/2013, SpO2 98 %.  Intake/Output Summary (Last 24 hours) at 04/02/16 1138 Last data filed at 04/02/16 0700  Gross per 24 hour  Intake    410 ml  Output      0 ml  Net    410 ml    Telemetry: Sinus rhythm to sinus tachycardia, rates 90s  to low 100s.  GEN: Morbidly obese woman in no distress. HEENT: Conjunctiva and lids normal, oropharynx clear. Neck: Supple, no elevated JVP or carotid bruits, no thyromegaly. Lungs: Clear to auscultation, nonlabored breathing at rest. Poor inspiratory effort. Cardiac: Regular rate and rhythm, tachycardic, no S3 or significant systolic murmur, no pericardial rub. Abdomen: Soft, nontender, obese , no hepatomegaly, bowel sounds present, no guarding or rebound. Extremities: No pitting edema, distal pulses 2+. Skin: Warm and dry. Musculoskeletal: No kyphosis. Neuropsychiatric: Alert and oriented x3, affect grossly appropriate.  Prior Cardiac Testing/Procedures 1. Right and Left Heart Cath 06/03/2012 1. Normal coronaries 2. Minimally elevated R-sided pressures with normal PVR  Ms. Niederer cath is essentially norma. R-sided pressures minimally elevated with normal PVR. Symptoms likely due to obesity and OSA. Will need weight loss. Will check echo to complete work-up.  2. Echocardiogram 01/19/2015 Left ventricle: The cavity size was normal. Wall thickness was increased in a pattern of mild LVH. Systolic  function was normal. The estimated ejection fraction was in the range of 55% to 60%. Wall motion was normal; there were no regional wall motion abnormalities. Doppler parameters are consistent with abnormal left ventricular relaxation (grade 1 diastolic dysfunction). - Aortic valve: Mildly calcified annulus. Trileaflet. - Mitral valve: Calcified annulus. There was trivial regurgitation. - Right ventricle: The cavity size was normal. Wall thickness was mildly increased. - Right atrium: Central venous pressure (est): 3 mm Hg. - Tricuspid valve: There was trivial regurgitation. - Pulmonary arteries: PA peak pressure: 36 mm Hg (S). - Pericardium, extracardiac: There was no pericardial effusion. Impressions - Mild LVH with LVEF 0000000, grade 1 diastolic dysfunction. MAC with trivial mitral regurgitation. Mildly increased RV wall thickness with low normal contraction. Trivial tricuspid regurgitation, Doppler jet not well seen. PASP 36 mmHg.  Lab Results  Basic Metabolic Panel:  Recent Labs Lab 04/01/16 1415  NA 140  K 4.0  CL 108  CO2 21*  GLUCOSE 190*  BUN 9  CREATININE 1.07*  CALCIUM 9.1  MG 1.3*    Liver Function Tests:  Recent Labs Lab 04/01/16 1415  AST 32  ALT 23  ALKPHOS 48  BILITOT 0.5  PROT 7.9  ALBUMIN 3.9    CBC:  Recent Labs Lab 04/01/16 1415  WBC 12.2*  NEUTROABS 9.3*  HGB 12.3  HCT 36.7  MCV 94.1  PLT 349    Cardiac Enzymes:  Recent Labs Lab 04/01/16 1415 04/01/16 1712 04/01/16 1939 04/01/16 2329  TROPONINI <0.03 <0.03 <0.03 <0.03    Radiology: Dg Chest Portable 1 View  04/01/2016  CLINICAL DATA:  Chest pain since 8 a.m., pain down left arm. EXAM: PORTABLE CHEST 1 VIEW COMPARISON:  Chest x-rays dated 09/09/2013 11/17/2012. FINDINGS: Heart size is upper normal. Right lung base is difficult to characterize due to body habitus but lungs appear grossly clear throughout. No evidence of pneumonia. No pleural effusion or  pneumothorax seen. Osseous structures about the chest are unremarkable. IMPRESSION: No evidence of acute cardiopulmonary abnormality. Electronically Signed   By: Franki Cabot M.D.   On: 04/01/2016 14:57    ECG: Sinus tachycardia, rate 110 bpm with mild LVH and prolonged QT interval.   Impression and Recommendations  1. Atypical chest pain: Atypical "sharp" discomfort in the setting of diarrhea, also likely related to bronchospasm and asthma based on history. She has had some improvement with breathing treatments. Troponin I levels are normal and argue against ACS, ECG is nonacute. She has a history of normal coronary arteries at cardiac catheterization in  2013. Doubt cardiac etiology of her discomfort at this time. No plan for ischemic cardiac testing at this time.  2. Hypomagnesemia: She denies use of PPI, or alcohol. She has had significant diarrhea but potassium status is normal. She does use a diuretic as needed at home. This has been repleted. May need outpatient oral supplement.  3. Asthma: Continues to have dyspnea on exertion, along with need for frequent nebulizer treatments and rescue inhaler.  4. Pulmonary hypertension: Likely related to #3 and obesity/OSA. Mild by last echocardiogram in January 2016. Would recommend a follow-up study as an outpatient.  Signed: Phill Myron. Lawrence NP [place]  04/02/2016, 11:38 AM Co-Sign MD  Attending note:  Patient seen and examined. Reviewed records and updated the chart. Modified above note by Ms. Lawrence NP. Ms. Macaraeg presents with recurring diarrhea and abdominal discomfort, also "sharp" left sided chest pain that seems to be occurring in relation to her diarrhea. She also has known asthma and obesity/OSA with a history of recurring chest pain over time, and normal coronary arteries by cardiac catheterization in 2013. Troponin I levels and BNP are normal arguing against ACS. ECG nonspecific with prolonged QT interval. Chest x-ray shows no  pulmonary edema.  On examination she appears comfortable, had a recent bout of diarrhea and chest pain however, felt better after breathing treatment. Blood pressure 138/60, heart rate in the 90 to 100 range. As he didn't exhibit decreased breath sounds throughout with prolonged expiratory phase. Cardiac exam with distant RRR, no gallop.  Patient presented with atypical chest pain, doubt ischemic etiology based on history, previous testing, and description of symptoms. Cardiac markers argue against ACS. Do not anticipate further ischemic evaluation at this time. She states that she is due to follow-up with Sara Tran later this month in the CHF clinic where he she has been evaluated for pulmonary hypertension in the past. Would recommend a follow-up visit with Sara Tran in the next few weeks with an echocardiogram around that time to follow up on LVEF and pulmonary pressures. We will sign off for now.   Satira Tran, M.D., F.A.C.C.

## 2016-04-02 NOTE — Discharge Summary (Signed)
Physician Discharge Summary  Sara Tran [id number] DOB: 03-12-1956 DOA: 04/01/2016  PCP: Purvis Kilts, MD  Admit date: 04/01/2016 Discharge date: 04/02/2016  Time spent: 20 minutes  Recommendations for Outpatient Follow-up:  1. Follow up with PCP in 2-3 weeks 2. Follow up with Dr. Aundra Dubin as scheduled 3. Follow up with Kathi Simpers, NP on 4/4 at 2:30pm   Discharge Diagnoses:  Active Problems:   Morbid obesity ([place])   Diabetes mellitus ([place])   Chest pain   Discharge Condition: Stable  Diet recommendation: Diabetic, heart healthy  Filed Weights   04/01/16 1413  Weight: 122.471 kg (270 lb)    History of present illness:  Please review dictated H and P from 4/2 for details. Briefly, 60yo with hx of prior MIx3, DM, IBS, COPD/asthma, morbid obesity who presents with chest pains. Patient was admitted for further work up.  Hospital Course:  #1 chest pain  Admited to telemetry  Serial trop neg x 4  Cardiology was consulted. Likely not ACS with no further w/u recommended  Patient was started on trial of PPI given hx of hiatal hernia #2 diabetes  Cardiac carb modified diet  CBG before meals and daily at bedtime  Sign scale insulin while admitted  Continued metformin for now. Will defer to PCP whether alternative agent would be more suitable given pt's hx of diarrhea (see below) #3 morbid obesity- Stable. S/p lap band procedure. Still morbidly obese #4 coronary artery disease - per above #5 Chronic diarrhea - per pt , several months hx of diarrhea. Renal function is normal. Doubt infectious. Pt had been followed by Dr. Oneida Alar. Have arranged outpt f/u GI appt on 4/4 at 2:30pm. Given trial of imodium  Consultations:  Cardiology  Discharge Exam: Filed Vitals:   04/02/16 0229 04/02/16 0635 04/02/16 0741 04/02/16 0746  BP: 141/58 138/60    Pulse: 96 93    Temp: 98.1 F (36.7 C) 97.5 F (36.4 C)    TempSrc: Oral Oral    Resp: 19 20    Height:      Weight:       SpO2: 97% 97% 97% 98%    General: awake, in nad Cardiovascular: regular, s1, s2 Respiratory: normal resp effort, no wheezing  Discharge Instructions     Medication List    STOP taking these medications        predniSONE 10 MG tablet  Commonly known as:  DELTASONE     predniSONE 20 MG tablet  Commonly known as:  DELTASONE      TAKE these medications        albuterol 108 (90 Base) MCG/ACT inhaler  Commonly known as:  PROVENTIL HFA;VENTOLIN HFA  Inhale 2 puffs into the lungs every 6 (six) hours as needed. FOR SHORTNESS OF BREATH/WHEEZING     albuterol (2.5 MG/3ML) 0.083% nebulizer solution  Commonly known as:  PROVENTIL     ALPRAZolam 0.5 MG tablet  Commonly known as:  XANAX  Take 0.5 mg by mouth 3 (three) times daily as needed. For anxiety/sleep     amLODipine 10 MG tablet  Commonly known as:  NORVASC  Take 1 tablet by mouth every evening.     beclomethasone 80 MCG/ACT inhaler  Commonly known as:  QVAR  Inhale 2 puffs into the lungs 2 (two) times daily as needed. Reported on 03/21/2016     dicyclomine 10 MG capsule  Commonly known as:  BENTYL  TAKE 1 TO 2 CAPSULES BEFORE MEALS AND AT BEDTIME  fenofibrate 160 MG tablet  Take 160 mg by mouth daily.     fluticasone 50 MCG/ACT nasal spray  Commonly known as:  FLONASE  Place 1-2 sprays into the nose daily.     furosemide 40 MG tablet  Commonly known as:  LASIX  Take 40 mg by mouth daily as needed.     gabapentin 100 MG capsule  Commonly known as:  NEURONTIN  Take 1 capsule (100 mg total) by mouth 3 (three) times daily.     ipratropium 0.02 % nebulizer solution  Commonly known as:  ATROVENT  Take 0.5 mg by nebulization every 6 (six) hours as needed.     isosorbide mononitrate 30 MG 24 hr tablet  Commonly known as:  IMDUR  Take 30 mg by mouth every morning.     loperamide 2 MG capsule  Commonly known as:  IMODIUM  Take 1 capsule (2 mg total) by mouth as needed for diarrhea or loose stools.      loratadine 10 MG tablet  Commonly known as:  CLARITIN  Take 10 mg by mouth every morning.     losartan 100 MG tablet  Commonly known as:  COZAAR  Take 100 mg by mouth every morning.     metFORMIN 500 MG tablet  Commonly known as:  GLUCOPHAGE  Take 1 tablet by mouth 2 (two) times daily.     nitroGLYCERIN 0.4 MG SL tablet  Commonly known as:  NITROSTAT  Place 0.4 mg under the tongue every 5 (five) minutes as needed. Chest Pains     pantoprazole 40 MG tablet  Commonly known as:  PROTONIX  Take 1 tablet (40 mg total) by mouth daily.     potassium chloride SA 20 MEQ tablet  Commonly known as:  K-DUR,KLOR-CON  Take 20 mEq by mouth every morning.     tiZANidine 4 MG tablet  Commonly known as:  ZANAFLEX  Take 4 mg by mouth every 8 (eight) hours as needed for muscle spasms.     traMADol 50 MG tablet  Commonly known as:  ULTRAM  take 1 tablet by mouth four times a day if needed     triamcinolone cream 0.1 %  Commonly known as:  KENALOG  apply to affected area twice a day       Allergies  Allergen Reactions  . Aspirin Other (See Comments)    Rectal bleeding  . Bactrim [Sulfamethoxazole-Trimethoprim] Other (See Comments)    Cramping,bloating,vomiting,fever,headaches  . Ceftin [Cefuroxime Axetil] Swelling    Swollen tongue, "made me crazy"  . Ciprofloxacin Cough    Coughed up blood  . Latex Itching and Rash       Follow-up Information    Follow up with Loralie Champagne, MD On 04/26/2016.   Specialty:  Cardiology   Why:  at 3:20pm at [place] information:   [address] [place] [address] [phone]       Follow up with Laban Emperor, NP On 04/03/2016.   Specialty:  Gastroenterology   Why:  at 2:30pm to address chronic diarrhea   Contact information:   [place] [address] [phone]       Follow up with Purvis Kilts, MD. Schedule an appointment as soon as possible for a visit in 2 weeks.   Specialty:  Family  Medicine   Why:  Hospital follow up   Contact information:   [address] [place] [id number] [phone]  The results of significant diagnostics from this hospitalization (including imaging, microbiology, ancillary and laboratory) are listed below for reference.    Significant Diagnostic Studies: Dg Chest Portable 1 View  04/01/2016  CLINICAL DATA:  Chest pain since 8 a.m., pain down left arm. EXAM: PORTABLE CHEST 1 VIEW COMPARISON:  Chest x-rays dated 09/09/2013 11/17/2012. FINDINGS: Heart size is upper normal. Right lung base is difficult to characterize due to body habitus but lungs appear grossly clear throughout. No evidence of pneumonia. No pleural effusion or pneumothorax seen. Osseous structures about the chest are unremarkable. IMPRESSION: No evidence of acute cardiopulmonary abnormality. Electronically Signed   By: Franki Cabot M.D.   On: 04/01/2016 14:57    Microbiology: No results found for this or any previous visit (from the past 240 hour(s)).   Labs: Basic Metabolic Panel:  Recent Labs Lab 04/01/16 1415  NA 140  K 4.0  CL 108  CO2 21*  GLUCOSE 190*  BUN 9  CREATININE 1.07*  CALCIUM 9.1  MG 1.3*   Liver Function Tests:  Recent Labs Lab 04/01/16 1415  AST 32  ALT 23  ALKPHOS 48  BILITOT 0.5  PROT 7.9  ALBUMIN 3.9    Recent Labs Lab 04/01/16 1415  LIPASE 19   No results for input(s): AMMONIA in the last 168 hours. CBC:  Recent Labs Lab 04/01/16 1415  WBC 12.2*  NEUTROABS 9.3*  HGB 12.3  HCT 36.7  MCV 94.1  PLT 349   Cardiac Enzymes:  Recent Labs Lab 04/01/16 1415 04/01/16 1712 04/01/16 1939 04/01/16 2329  TROPONINI <0.03 <0.03 <0.03 <0.03   BNP: BNP (last 3 results)  Recent Labs  04/01/16 1415  BNP 24.0    ProBNP (last 3 results) No results for input(s): PROBNP in the last 8760 hours.  CBG:  Recent Labs Lab 04/01/16 1738 04/01/16 2043 04/02/16 0706 04/02/16 1056  GLUCAP 153* 185* 144* 161*     Signed:  Eevie Lapp K  [facility] 04/02/2016, 12:24 PM

## 2016-04-02 NOTE — Care Management Note (Signed)
Case Management Note  Patient Details  Name: TMYA PIZZOFERRATO MRN: [id number] Date of Birth: 07/22/1956  Subjective/Objective:                  Observation for r/o CP. Pt is from home, lives alone and is ind with ADL's. Pt uses a cane for mobility, has a PCP, drives herself to appointments and has no difficulty  Obtaining her medications. Pt plans to return home with self care.   Action/Plan: Anticipate DC home today. No CM needs.   Expected Discharge Date:    04/02/2016              Expected Discharge Plan:  Home/Self Care  In-House Referral:  NA  Discharge planning Services  CM Consult  Post Acute Care Choice:  NA Choice offered to:  NA  DME Arranged:    DME Agency:     HH Arranged:    HH Agency:     Status of Service:  Completed, signed off  Medicare Important Message Given:    Date Medicare IM Given:    Medicare IM give by:    Date Additional Medicare IM Given:    Additional Medicare Important Message give by:     If discussed at [place] of Stay Meetings, dates discussed:    Additional Comments:  Sherald Barge, RN 04/02/2016, 10:46 AM

## 2016-04-02 NOTE — Progress Notes (Signed)
Discharge instructions and prescriptions given, verbalized understanding, out in stable condition via w/c with staff. 

## 2016-04-02 NOTE — Care Management Obs Status (Addendum)
[facility] NOTIFICATION   Patient Details  Name: Sara Tran MRN: [id number] Date of Birth: 02-Aug-1956   Medicare Observation Status Notification Given:  Yes (given but pt refused to sign)    Sherald Barge, RN 04/02/2016, 10:45 AM

## 2016-04-03 ENCOUNTER — Ambulatory Visit: Payer: Commercial Managed Care - HMO | Admitting: Gastroenterology

## 2016-04-05 DIAGNOSIS — J45909 Unspecified asthma, uncomplicated: Secondary | ICD-10-CM | POA: Diagnosis not present

## 2016-04-09 DIAGNOSIS — Z1389 Encounter for screening for other disorder: Secondary | ICD-10-CM | POA: Diagnosis not present

## 2016-04-09 DIAGNOSIS — Z6841 Body Mass Index (BMI) 40.0 and over, adult: Secondary | ICD-10-CM | POA: Diagnosis not present

## 2016-04-09 DIAGNOSIS — M545 Low back pain: Secondary | ICD-10-CM | POA: Diagnosis not present

## 2016-04-09 DIAGNOSIS — E119 Type 2 diabetes mellitus without complications: Secondary | ICD-10-CM | POA: Diagnosis not present

## 2016-04-09 DIAGNOSIS — R079 Chest pain, unspecified: Secondary | ICD-10-CM | POA: Diagnosis not present

## 2016-04-09 DIAGNOSIS — I270 Primary pulmonary hypertension: Secondary | ICD-10-CM | POA: Diagnosis not present

## 2016-04-09 DIAGNOSIS — G894 Chronic pain syndrome: Secondary | ICD-10-CM | POA: Diagnosis not present

## 2016-04-09 DIAGNOSIS — M1991 Primary osteoarthritis, unspecified site: Secondary | ICD-10-CM | POA: Diagnosis not present

## 2016-04-16 ENCOUNTER — Ambulatory Visit: Payer: Commercial Managed Care - HMO | Admitting: Physical Medicine & Rehabilitation

## 2016-04-25 ENCOUNTER — Ambulatory Visit (HOSPITAL_COMMUNITY): Payer: Commercial Managed Care - HMO | Attending: Physical Medicine & Rehabilitation | Admitting: Physical Therapy

## 2016-04-25 DIAGNOSIS — M6281 Muscle weakness (generalized): Secondary | ICD-10-CM | POA: Diagnosis not present

## 2016-04-25 DIAGNOSIS — M5417 Radiculopathy, lumbosacral region: Secondary | ICD-10-CM

## 2016-04-25 DIAGNOSIS — R2681 Unsteadiness on feet: Secondary | ICD-10-CM | POA: Diagnosis not present

## 2016-04-25 NOTE — Patient Instructions (Signed)
Isometric Abdominal    Lying on back with knees bent, tighten stomach by pressing elbows down. Hold ____ seconds. Repeat ____ times per set. Do ____ sets per session. Do ____ sessions per day.  http://orth.exer.us/1086   Copyright  VHI. All rights reserved.  Bridging    Slowly raise buttocks from floor, keeping stomach tight. Repeat ____ times per set. Do ____ sets per session. Do ____ sessions per day.  http://orth.exer.us/1096   Copyright  VHI. All rights reserved.  Strengthening: Hip Adduction - Isometric    With ball or folded pillow between knees, squeeze knees together. Hold _3-5___ seconds. Repeat _10___ times per set. Do ____ sets per session. Do ___2_ sessions per day. 1 http://orth.exer.us/612   Copyright  VHI. All rights reserved.
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
## 2016-04-26 ENCOUNTER — Inpatient Hospital Stay (HOSPITAL_COMMUNITY): Admit: 2016-04-26 | Payer: Commercial Managed Care - HMO

## 2016-05-02 ENCOUNTER — Encounter (HOSPITAL_COMMUNITY): Payer: Commercial Managed Care - HMO | Admitting: Physical Therapy

## 2016-05-02 ENCOUNTER — Encounter: Payer: Commercial Managed Care - HMO | Admitting: Physical Medicine & Rehabilitation

## 2016-05-02 ENCOUNTER — Telehealth (HOSPITAL_COMMUNITY): Payer: Self-pay | Admitting: Physical Therapy

## 2016-05-02 NOTE — Telephone Encounter (Signed)
Pt did not show for appointment.  Called and left message regarding missed appointment and reminded of next appointment on Friday. Teena Irani, PTA/CLT [phone]

## 2016-05-04 ENCOUNTER — Ambulatory Visit (HOSPITAL_COMMUNITY): Payer: Commercial Managed Care - HMO | Attending: Physical Medicine & Rehabilitation

## 2016-05-04 DIAGNOSIS — M6281 Muscle weakness (generalized): Secondary | ICD-10-CM | POA: Insufficient documentation

## 2016-05-04 DIAGNOSIS — M5417 Radiculopathy, lumbosacral region: Secondary | ICD-10-CM | POA: Insufficient documentation

## 2016-05-04 DIAGNOSIS — R2681 Unsteadiness on feet: Secondary | ICD-10-CM | POA: Insufficient documentation

## 2016-05-07 ENCOUNTER — Encounter: Payer: Self-pay | Admitting: Physical Medicine & Rehabilitation

## 2016-05-07 ENCOUNTER — Encounter
Payer: Commercial Managed Care - HMO | Attending: Physical Medicine & Rehabilitation | Admitting: Physical Medicine & Rehabilitation

## 2016-05-07 VITALS — BP 145/67 | HR 87 | Resp 16

## 2016-05-07 DIAGNOSIS — R209 Unspecified disturbances of skin sensation: Secondary | ICD-10-CM | POA: Diagnosis not present

## 2016-05-07 DIAGNOSIS — M47816 Spondylosis without myelopathy or radiculopathy, lumbar region: Secondary | ICD-10-CM

## 2016-05-07 DIAGNOSIS — M5416 Radiculopathy, lumbar region: Secondary | ICD-10-CM | POA: Diagnosis not present

## 2016-05-07 DIAGNOSIS — M7060 Trochanteric bursitis, unspecified hip: Secondary | ICD-10-CM | POA: Insufficient documentation

## 2016-05-07 DIAGNOSIS — M4316 Spondylolisthesis, lumbar region: Secondary | ICD-10-CM | POA: Insufficient documentation

## 2016-05-07 DIAGNOSIS — K589 Irritable bowel syndrome without diarrhea: Secondary | ICD-10-CM | POA: Insufficient documentation

## 2016-05-07 DIAGNOSIS — M545 Low back pain: Secondary | ICD-10-CM | POA: Insufficient documentation

## 2016-05-07 DIAGNOSIS — J449 Chronic obstructive pulmonary disease, unspecified: Secondary | ICD-10-CM | POA: Diagnosis not present

## 2016-05-07 DIAGNOSIS — Z87891 Personal history of nicotine dependence: Secondary | ICD-10-CM | POA: Insufficient documentation

## 2016-05-07 DIAGNOSIS — M25552 Pain in left hip: Secondary | ICD-10-CM | POA: Diagnosis present

## 2016-05-07 DIAGNOSIS — E119 Type 2 diabetes mellitus without complications: Secondary | ICD-10-CM | POA: Insufficient documentation

## 2016-05-07 DIAGNOSIS — M25551 Pain in right hip: Secondary | ICD-10-CM | POA: Diagnosis present
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
## 2016-05-07 NOTE — Patient Instructions (Signed)
  PLEASE CALL ME WITH ANY PROBLEMS OR QUESTIONS (#[phone]).      

## 2016-05-09 ENCOUNTER — Encounter (HOSPITAL_COMMUNITY): Payer: Commercial Managed Care - HMO | Admitting: Physical Therapy

## 2016-05-11 ENCOUNTER — Encounter (HOSPITAL_COMMUNITY): Payer: Commercial Managed Care - HMO

## 2016-05-14 ENCOUNTER — Ambulatory Visit (HOSPITAL_COMMUNITY): Payer: Commercial Managed Care - HMO | Admitting: Physical Therapy

## 2016-05-14 DIAGNOSIS — M6281 Muscle weakness (generalized): Secondary | ICD-10-CM | POA: Diagnosis not present

## 2016-05-14 DIAGNOSIS — R2681 Unsteadiness on feet: Secondary | ICD-10-CM | POA: Diagnosis not present

## 2016-05-14 DIAGNOSIS — M5417 Radiculopathy, lumbosacral region: Secondary | ICD-10-CM | POA: Diagnosis not present

## 2016-05-14 NOTE — Patient Instructions (Signed)
Heel Raise: Bilateral (Standing)    Rise on balls of feet. Repeat __10__ times per set. Do __1__ sets per session. Do 2____ sessions per day.  http://orth.exer.us/38   Copyright  VHI. All rights reserved.  Functional Quadriceps: Chair Squat    Keeping feet flat on floor, shoulder width apart, squat as low as is comfortable. Use support as necessary. Repeat 10____ times per set. Do __1__ sets per session. Do __2__ sessions per day.  http://orth.exer.us/736   Copyright  VHI. All rights reserved.  Functional Quadriceps: Sit to Stand    Sit on edge of chair, feet flat on floor. Stand upright, extending knees fully. Repeat _10___ times per set. Do __1__ sets per session. Do ___2_ sessions per day.  http://orth.exer.us/734   Copyright  VHI. All rights reserved.  Stretching: Hamstring (Standing)    Place right foot on stool. Slowly lean forward, keeping back straight, until stretch is felt in back of thigh. Hold _30___ seconds. Repeat __3__ times per set. Do __1__ sets per session. Do 3____ sessions per day.  http://orth.exer.us/658   Copyright  VHI. All rights reserved.

## 2016-05-14 NOTE — Therapy (Signed)
[place], [place], [address] Phone: [phone]   Fax:  [phone]  Physical Therapy Treatment  Patient Details  Name: Sara Tran MRN: [id number] Date of Birth: [birth year] Referring Provider: Dr. Alger Simons  Encounter Date: 05/14/2016      PT End of Session - 05/14/16 1606    Visit Number 2   Number of Visits 16   Date for PT Re-Evaluation 05/25/16   Authorization Type [employer] medicare   Authorization - Visit Number 2   Authorization - Number of Visits 10   PT Start Time [id number]   PT Stop Time [id number]  last 8' on nustep with no charge   PT Time Calculation (min) 47 min   Activity Tolerance Patient tolerated treatment well   Behavior During Therapy [facility] for tasks assessed/performed      Past Medical History  Diagnosis Date  . Type 2 diabetes mellitus ([place])   . Sleep apnea     CPAP use  . Pulmonary HTN ([place])     [place] 2008:  RA pressures 13/10 with a mean of 7 mmHg.  RV pressure 123456 with end diastolic pressure of 15 mmHg.  PA pressure 49/23 with a mean of 35 mmHg.  Pulmonary capillary wedge 17/50 with a mean of 14 mmHg. The PA saturation was 68%.  RA saturation was 71% and aortic saturation was 90%.  Cardiac output was 6.0 with a cardiac index of 2.80 by Fick.  Normal coronaries by cath 2008.  . Morbid obesity ([place])     s/p lap band surgery  . Asthma   . Arthritis   . Hypertriglyceridemia   . COPD (chronic obstructive pulmonary disease) ([place])   . IBS (irritable bowel syndrome)   . Renal insufficiency   . Chronic back pain   . Chronic hip pain   . Sciatica of right side   . Uterine fibroid   . Internal hemorrhoids   . Hot flashes   . Adnexal cyst     Right simple cyst seen on [place]  . Herpes simplex virus (HSV) infection   . History of cardiac catheterization     Normal coronaries 2013    Past Surgical History  Procedure Laterality Date  . Laparoscopic cholecystectomy  1985  . Laparoscopic  gastric banding  12/12/2010  . Pilonidal cyst excision  1974  . Tumor excision  10/2011    Left thigh  . Tumor excision  10/2011    Face  . Colonoscopy  10/18/10    SLF:2-mm sessile sigmoid polyp/diverticula  . Hemorrhoid banding  07/2012    Dr. Oneida Alar  . Cardiac catheterization  07/2007, 05/2012    Normal coronary arteries  . Left and right heart catheterization with coronary angiogram N/A 06/03/2012    Procedure: LEFT AND RIGHT HEART CATHETERIZATION WITH CORONARY ANGIOGRAM;  Surgeon: Jolaine Artist, MD;  Location: [facility] CATH LAB;  Service: Cardiovascular;  Laterality: N/A;    There were no vitals filed for this visit.      Subjective Assessment - 05/14/16 1524    Subjective Pt states that she has been doing a lot of sitting so her pain is up to a 9/10    Pertinent History obesity, DM, HTN, COPD    Currently in Pain? Yes   Pain Score 9    Pain Location --  Pain is all over but worse in the back and hips    Pain Type Chronic pain   Pain Radiating Towards  all over    Pain Onset More than a month ago   Pain Frequency Constant   Aggravating Factors  sitting    Pain Relieving Factors keep moving                          [facility] Adult PT Treatment/Exercise - 05/14/16 0001    Lumbar Exercises: Stretches   Passive Hamstring Stretch 3 reps;30 seconds   Passive Hamstring Stretch Limitations standing on 12" step    Lumbar Exercises: Aerobic   Stationary Bike nustep hills 3;level 2 x 8:00   Lumbar Exercises: Standing   Heel Raises 10 reps   Functional Squats 10 reps   Forward Lunge 5 reps   Forward Lunge Limitations bilateral    Scapular Retraction Strengthening;Both;10 reps;Theraband   Theraband Level (Scapular Retraction) Level 3 (Green)   Row Strengthening;Both;10 reps;Theraband   Shoulder Extension Strengthening;Both;10 reps;Theraband   Theraband Level (Shoulder Extension) Level 3 (Green)   Other Standing Lumbar Exercises 3-D hip excursion exercises    Other  Standing Lumbar Exercises B hip abduction/extension with 4# x 10 @    Lumbar Exercises: Seated   Long Arc Quad on Chair 10 reps;Both;Weights   LAQ on Chair Weights (lbs) 4    Sit to Stand 10 reps   Lumbar Exercises: Supine   Bridge 15 reps                PT Education - 05/14/16 1606    Education provided Yes   Education Details new HEP    Person(s) Educated Patient   Methods Explanation   Comprehension Verbalized understanding          PT Short Term Goals - 05/14/16 1616    PT SHORT TERM GOAL #1   Title Pt to improve core and both lower extremity strength by one grade to allow patient to walk for 15 minutes with balck pain not going past a 6/10    Time 4   Period Weeks   Status On-going   PT SHORT TERM GOAL #2   Title PT to be able to come from sit to stand from the couch wth ease    Time 4   Period Weeks   Status On-going   PT SHORT TERM GOAL #3   Title Pt to be able to complete 10 mintues of light housework with her back pain not incraasing past a 7/10.   Time 4   Period Weeks   Status On-going   PT SHORT TERM GOAL #4   Title Pt to be able to stand for ten minutes for social engagement without her back  pain increasing beyond a 7/10    Time 4   Period Weeks   Status On-going           PT Long Term Goals - 05/14/16 1617    PT LONG TERM GOAL #1   Title Pt core and both lower extremity strength musculature  to be at least a 4/5 to allow pt to be able to ambulate for 25 minutes straight to reduce weight with back pain not exceeding a 4/10   Time 8   Period Weeks   Status On-going   PT LONG TERM GOAL #2   Title Pt single leg stance to be at least 10 seconds bilateally to allow patient to feel confident walking on uneven ground.   Time 8   Period Weeks   Status On-going   PT LONG TERM GOAL #  3   Title Patient to be able to go up two flights of steps without her back pain going over 4/10 .   Time 8   Period Weeks   Status On-going   PT LONG TERM GOAL  #4   Title Patient to state that she is able to complete 20 minutes of housework without her back pain increasing past 4/10.   Time 8   Period Weeks   Status On-going   PT LONG TERM GOAL #5   Title Patient's back and hip ROM to improve to allow patient to bend and pick an item off the floor with ease.    Time 8   Period Weeks   Status On-going               Plan - 05/14/16 1614    Clinical Impression Statement Pt has not returned since initial evaluation.  Pt evaluation and goals reviewed wth patient. Added standing activity and given new HEP. Therapist facilitated exercises for proper technique.    Rehab Potential Good   PT Frequency 2x / week   PT Duration 8 weeks   PT Treatment/Interventions ADLs/Self Care Home Management;Cryotherapy;Electrical Stimulation;Moist Heat;Ultrasound;Therapeutic exercise;Patient/family education;Balance training   PT Next Visit Plan Add quadriped exercises       Patient will benefit from skilled therapeutic intervention in order to improve the following deficits and impairments:  Abnormal gait, Cardiopulmonary status limiting activity, Decreased activity tolerance, Decreased balance, Decreased range of motion, Difficulty walking, Decreased strength, Pain, Obesity  Visit Diagnosis: Radiculopathy, lumbosacral region  Unsteadiness on feet  Muscle weakness (generalized)     Problem List Patient Active Problem List   Diagnosis Date Noted  . Chronic pain syndrome 10/11/2015  . Lumbar radiculopathy 07/25/2015  . Fibroid 10/13/2013  . Adnexal cyst 10/13/2013  . PMB (postmenopausal bleeding) 10/06/2013  . Unspecified symptom associated with female genital organs 10/06/2013  . Hot flashes 10/06/2013  . Internal hemorrhoids with other complication [id number]  . 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 Hip pain 04/09/2013  . Hemorrhoids, internal 03/12/2013  . Back pain 03/11/2013  . Encounter for therapeutic drug monitoring 03/11/2013   . Trochanteric bursitis 03/11/2013  . Lumbar facet arthropathy 03/11/2013  . Unstable angina ([place]) 06/02/2012  . Acute on chronic renal insufficiency ([place]) 06/02/2012  . Chest pain 04/22/2012  . IBS (irritable bowel syndrome)-DIARRHEA 01/23/2012  . Morbid obesity ([place]) 08/09/2011  . Diabetes mellitus ([place]) 08/09/2011  . DIARRHEA 10/11/2010  . Chronic diastolic heart failure ([place]) 12/16/2007  . Pulmonary hypertension ([place]) 12/12/2007  . OSTEOARTHRITIS 12/11/2007  . SLEEP APNEA 12/11/2007    Rayetta Humphrey, PT CLT [phone] 05/14/2016, 4:18 PM  [facility] [address] [place], [place], [address] Phone: [phone]   Fax:  [phone]  Name: COILA YARBROUGH MRN: [id number] Date of Birth: [birth year]

## 2016-05-16 ENCOUNTER — Ambulatory Visit (HOSPITAL_COMMUNITY): Payer: Commercial Managed Care - HMO | Admitting: Physical Therapy

## 2016-05-16 DIAGNOSIS — M5417 Radiculopathy, lumbosacral region: Secondary | ICD-10-CM | POA: Diagnosis not present

## 2016-05-16 DIAGNOSIS — R2681 Unsteadiness on feet: Secondary | ICD-10-CM

## 2016-05-16 DIAGNOSIS — M6281 Muscle weakness (generalized): Secondary | ICD-10-CM | POA: Diagnosis not present

## 2016-05-16 NOTE — Therapy (Signed)
[place], [place], [address] Phone: [phone]   Fax:  [phone]  Physical Therapy Treatment  Patient Details  Name: Sara Tran MRN: [id number] Date of Birth: 04-24-1956 Referring Provider: Dr. Alger Simons  Encounter Date: 05/16/2016      PT End of Session - 05/16/16 1428    Visit Number 3   Number of Visits 16   Date for PT Re-Evaluation 05/25/16   Authorization Type [employer] medicare   Authorization - Visit Number 3   Authorization - Number of Visits 10   PT Start Time [id number]   PT Stop Time 1430   PT Time Calculation (min) 45 min   Activity Tolerance Patient tolerated treatment well   Behavior During Therapy [facility] for tasks assessed/performed      Past Medical History  Diagnosis Date  . Type 2 diabetes mellitus ([place])   . Sleep apnea     CPAP use  . Pulmonary HTN ([place])     [place] 2008:  RA pressures 13/10 with a mean of 7 mmHg.  RV pressure 123456 with end diastolic pressure of 15 mmHg.  PA pressure 49/23 with a mean of 35 mmHg.  Pulmonary capillary wedge 17/50 with a mean of 14 mmHg. The PA saturation was 68%.  RA saturation was 71% and aortic saturation was 90%.  Cardiac output was 6.0 with a cardiac index of 2.80 by Fick.  Normal coronaries by cath 2008.  . Morbid obesity ([place])     s/p lap band surgery  . Asthma   . Arthritis   . Hypertriglyceridemia   . COPD (chronic obstructive pulmonary disease) ([place])   . IBS (irritable bowel syndrome)   . Renal insufficiency   . Chronic back pain   . Chronic hip pain   . Sciatica of right side   . Uterine fibroid   . Internal hemorrhoids   . Hot flashes   . Adnexal cyst     Right simple cyst seen on [place]  . Herpes simplex virus (HSV) infection   . History of cardiac catheterization     Normal coronaries 2013    Past Surgical History  Procedure Laterality Date  . Laparoscopic cholecystectomy  1985  . Laparoscopic gastric banding  12/12/2010  .  Pilonidal cyst excision  1974  . Tumor excision  10/2011    Left thigh  . Tumor excision  10/2011    Face  . Colonoscopy  10/18/10    SLF:2-mm sessile sigmoid polyp/diverticula  . Hemorrhoid banding  07/2012    Dr. Oneida Alar  . Cardiac catheterization  07/2007, 05/2012    Normal coronary arteries  . Left and right heart catheterization with coronary angiogram N/A 06/03/2012    Procedure: LEFT AND RIGHT HEART CATHETERIZATION WITH CORONARY ANGIOGRAM;  Surgeon: Jolaine Artist, MD;  Location: [facility] CATH LAB;  Service: Cardiovascular;  Laterality: N/A;    There were no vitals filed for this visit.      Subjective Assessment - 05/16/16 1348    Subjective Pt states her back pain is not that bad today    Pertinent History obesity, DM, HTN, COPD    Currently in Pain? Yes   Pain Score 6    Pain Location Back   Pain Orientation Right   Pain Descriptors / Indicators Aching   Pain Type Chronic pain   Pain Radiating Towards toes    Pain Onset More than a month ago   Pain Frequency Constant  [facility] Adult PT Treatment/Exercise - 05/16/16 0001    Lumbar Exercises: Stretches   Prone on Elbows Stretch 60 seconds  radicular sx are better    Press Ups 5 reps   Lumbar Exercises: Standing   Heel Raises 10 reps  (combined with squat with yellow ball)    Functional Squats 10 reps   Forward Lunge 5 reps   Scapular Retraction Strengthening;Both;10 reps;Theraband   Theraband Level (Scapular Retraction) Level 3 (Green)   Shoulder Extension Strengthening;Both;10 reps;Theraband   Theraband Level (Shoulder Extension) Level 3 (Green)   Other Standing Lumbar Exercises 3-D hip excursion exercises    Lumbar Exercises: Supine   Bridge 15 reps   Lumbar Exercises: Prone   Single Arm Raise 10 reps   Straight Leg Raise 10 reps   Opposite Arm/Leg Raise 5 reps                PT Education - 05/16/16 1428    Education provided Yes   Education Details prone  exercises   Person(s) Educated Patient   Methods Explanation   Comprehension Returned demonstration          PT Short Term Goals - 05/14/16 1616    PT SHORT TERM GOAL #1   Title Pt to improve core and both lower extremity strength by one grade to allow patient to walk for 15 minutes with balck pain not going past a 6/10    Time 4   Period Weeks   Status On-going   PT SHORT TERM GOAL #2   Title PT to be able to come from sit to stand from the couch wth ease    Time 4   Period Weeks   Status On-going   PT SHORT TERM GOAL #3   Title Pt to be able to complete 10 mintues of light housework with her back pain not incraasing past a 7/10.   Time 4   Period Weeks   Status On-going   PT SHORT TERM GOAL #4   Title Pt to be able to stand for ten minutes for social engagement without her back  pain increasing beyond a 7/10    Time 4   Period Weeks   Status On-going           PT Long Term Goals - 05/14/16 1617    PT LONG TERM GOAL #1   Title Pt core and both lower extremity strength musculature  to be at least a 4/5 to allow pt to be able to ambulate for 25 minutes straight to reduce weight with back pain not exceeding a 4/10   Time 8   Period Weeks   Status On-going   PT LONG TERM GOAL #2   Title Pt single leg stance to be at least 10 seconds bilateally to allow patient to feel confident walking on uneven ground.   Time 8   Period Weeks   Status On-going   PT LONG TERM GOAL #3   Title Patient to be able to go up two flights of steps without her back pain going over 4/10 .   Time 8   Period Weeks   Status On-going   PT LONG TERM GOAL #4   Title Patient to state that she is able to complete 20 minutes of housework without her back pain increasing past 4/10.   Time 8   Period Weeks   Status On-going   PT LONG TERM GOAL #5   Title Patient's back and hip ROM to improve  to allow patient to bend and pick an item off the floor with ease.    Time 8   Period Weeks   Status  On-going               Plan - 05/16/16 1429    Clinical Impression Statement Attempted to add quadriped exercises to pt but pt was unable to tolerate holding body weight with UE>  Added prone exercises and stairs which pt only felt comfortable going up and down one at a time.  Pt needed constant verbal and manual cuing  for proper technique    Rehab Potential Good   PT Treatment/Interventions ADLs/Self Care Home Management;Cryotherapy;Electrical Stimulation;Moist Heat;Ultrasound;Therapeutic exercise;Patient/family education;Balance training   PT Next Visit Plan wall pushups, and wall sqaut with ball next treatment.       Patient will benefit from skilled therapeutic intervention in order to improve the following deficits and impairments:  Abnormal gait, Cardiopulmonary status limiting activity, Decreased activity tolerance, Decreased balance, Decreased range of motion, Difficulty walking, Decreased strength, Pain, Obesity  Visit Diagnosis: Radiculopathy, lumbosacral region  Unsteadiness on feet  Muscle weakness (generalized)     Problem List Patient Active Problem List   Diagnosis Date Noted  . Chronic pain syndrome 10/11/2015  . Lumbar radiculopathy 07/25/2015  . Fibroid 10/13/2013  . Adnexal cyst 10/13/2013  . PMB (postmenopausal bleeding) 10/06/2013  . Unspecified symptom associated with female genital organs 10/06/2013  . Hot flashes 10/06/2013  . Internal hemorrhoids with other complication [id number]  . 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 Hip pain 04/09/2013  . Hemorrhoids, internal 03/12/2013  . Back pain 03/11/2013  . Encounter for therapeutic drug monitoring 03/11/2013  . Trochanteric bursitis 03/11/2013  . Lumbar facet arthropathy 03/11/2013  . Unstable angina ([place]) 06/02/2012  . Acute on chronic renal insufficiency ([place]) 06/02/2012  . Chest pain 04/22/2012  . IBS (irritable bowel syndrome)-DIARRHEA 01/23/2012  . Morbid obesity ([place]) 08/09/2011   . Diabetes mellitus ([place]) 08/09/2011  . DIARRHEA 10/11/2010  . Chronic diastolic heart failure ([place]) 12/16/2007  . Pulmonary hypertension ([place]) 12/12/2007  . OSTEOARTHRITIS 12/11/2007  . SLEEP APNEA 12/11/2007    Rayetta Humphrey, PT CLT [phone] 05/16/2016, 2:34 PM  [facility] [address] [place], [place], [address] Phone: [phone]   Fax:  [phone]  Name: Sara Tran MRN: [id number] Date of Birth: November 21, 1956

## 2016-05-17 ENCOUNTER — Telehealth (HOSPITAL_COMMUNITY): Payer: Self-pay

## 2016-05-17 NOTE — Telephone Encounter (Signed)
05/17/16 pt left a messge to see if she had appts for 5/19 and 5/22.... I called her back and told her she did not but I had to leave a message

## 2016-05-18 ENCOUNTER — Other Ambulatory Visit: Payer: Self-pay

## 2016-05-18 DIAGNOSIS — Z1231 Encounter for screening mammogram for malignant neoplasm of breast: Secondary | ICD-10-CM

## 2016-05-23 ENCOUNTER — Ambulatory Visit (HOSPITAL_COMMUNITY): Payer: Commercial Managed Care - HMO | Admitting: Physical Therapy

## 2016-05-23 DIAGNOSIS — M5417 Radiculopathy, lumbosacral region: Secondary | ICD-10-CM | POA: Diagnosis not present

## 2016-05-23 DIAGNOSIS — M6281 Muscle weakness (generalized): Secondary | ICD-10-CM | POA: Diagnosis not present

## 2016-05-23 DIAGNOSIS — R2681 Unsteadiness on feet: Secondary | ICD-10-CM | POA: Diagnosis not present

## 2016-05-23 NOTE — Therapy (Signed)
[place], [place], [address] Phone: [phone]   Fax:  [phone]  Physical Therapy Treatment  Patient Details  Name: Sara Tran MRN: [id number] Date of Birth: Dec 18, 1956 Referring Provider: Dr. Alger Simons  Encounter Date: 05/23/2016      PT End of Session - 05/23/16 1431    Visit Number 4   Number of Visits 16   Date for PT Re-Evaluation 05/25/16   Authorization Type [employer] medicare   Authorization - Visit Number 4   Authorization - Number of Visits 10   PT Start Time [id number]   PT Stop Time 1435   PT Time Calculation (min) 50 min   Activity Tolerance Patient tolerated treatment well   Behavior During Therapy [facility] for tasks assessed/performed      Past Medical History  Diagnosis Date  . Type 2 diabetes mellitus ([place])   . Sleep apnea     CPAP use  . Pulmonary HTN ([place])     [place] 2008:  RA pressures 13/10 with a mean of 7 mmHg.  RV pressure 123456 with end diastolic pressure of 15 mmHg.  PA pressure 49/23 with a mean of 35 mmHg.  Pulmonary capillary wedge 17/50 with a mean of 14 mmHg. The PA saturation was 68%.  RA saturation was 71% and aortic saturation was 90%.  Cardiac output was 6.0 with a cardiac index of 2.80 by Fick.  Normal coronaries by cath 2008.  . Morbid obesity ([place])     s/p lap band surgery  . Asthma   . Arthritis   . Hypertriglyceridemia   . COPD (chronic obstructive pulmonary disease) ([place])   . IBS (irritable bowel syndrome)   . Renal insufficiency   . Chronic back pain   . Chronic hip pain   . Sciatica of right side   . Uterine fibroid   . Internal hemorrhoids   . Hot flashes   . Adnexal cyst     Right simple cyst seen on [place]  . Herpes simplex virus (HSV) infection   . History of cardiac catheterization     Normal coronaries 2013    Past Surgical History  Procedure Laterality Date  . Laparoscopic cholecystectomy  1985  . Laparoscopic gastric banding  12/12/2010  .  Pilonidal cyst excision  1974  . Tumor excision  10/2011    Left thigh  . Tumor excision  10/2011    Face  . Colonoscopy  10/18/10    SLF:2-mm sessile sigmoid polyp/diverticula  . Hemorrhoid banding  07/2012    Dr. Oneida Alar  . Cardiac catheterization  07/2007, 05/2012    Normal coronary arteries  . Left and right heart catheterization with coronary angiogram N/A 06/03/2012    Procedure: LEFT AND RIGHT HEART CATHETERIZATION WITH CORONARY ANGIOGRAM;  Surgeon: Jolaine Artist, MD;  Location: [facility] CATH LAB;  Service: Cardiovascular;  Laterality: N/A;    There were no vitals filed for this visit.      Subjective Assessment - 05/23/16 1351    Subjective Pt states that going up and down steps is difficult for her.   Pain Score 7    Pain Location Knee   Pain Orientation Right;Left   Pain Type Chronic pain   Pain Onset More than a month ago                         [facility] Adult PT Treatment/Exercise - 05/23/16 0001    Exercises  Exercises Knee/Hip   Lumbar Exercises: Standing   Other Standing Lumbar Exercises 3-D hip excursion exercises;B LE hip abduction/extension 5# x 10 reps each    Other Standing Lumbar Exercises wall pushup x 10 ; wall squat x 10    Lumbar Exercises: Seated   Sit to Stand 10 reps   Lumbar Exercises: Supine   Bridge 15 reps   Lumbar Exercises: Prone   Single Arm Raise 10 reps   Straight Leg Raise 10 reps   Opposite Arm/Leg Raise 10 reps   Knee/Hip Exercises: Standing   Heel Raises 10 reps   Forward Step Up 10 reps;Step Height: 6";Both                  PT Short Term Goals - 05/14/16 1616    PT SHORT TERM GOAL #1   Title Pt to improve core and both lower extremity strength by one grade to allow patient to walk for 15 minutes with balck pain not going past a 6/10    Time 4   Period Weeks   Status On-going   PT SHORT TERM GOAL #2   Title PT to be able to come from sit to stand from the couch wth ease    Time 4   Period Weeks    Status On-going   PT SHORT TERM GOAL #3   Title Pt to be able to complete 10 mintues of light housework with her back pain not incraasing past a 7/10.   Time 4   Period Weeks   Status On-going   PT SHORT TERM GOAL #4   Title Pt to be able to stand for ten minutes for social engagement without her back  pain increasing beyond a 7/10    Time 4   Period Weeks   Status On-going           PT Long Term Goals - 05/14/16 1617    PT LONG TERM GOAL #1   Title Pt core and both lower extremity strength musculature  to be at least a 4/5 to allow pt to be able to ambulate for 25 minutes straight to reduce weight with back pain not exceeding a 4/10   Time 8   Period Weeks   Status On-going   PT LONG TERM GOAL #2   Title Pt single leg stance to be at least 10 seconds bilateally to allow patient to feel confident walking on uneven ground.   Time 8   Period Weeks   Status On-going   PT LONG TERM GOAL #3   Title Patient to be able to go up two flights of steps without her back pain going over 4/10 .   Time 8   Period Weeks   Status On-going   PT LONG TERM GOAL #4   Title Patient to state that she is able to complete 20 minutes of housework without her back pain increasing past 4/10.   Time 8   Period Weeks   Status On-going   PT LONG TERM GOAL #5   Title Patient's back and hip ROM to improve to allow patient to bend and pick an item off the floor with ease.    Time 8   Period Weeks   Status On-going               Plan - 05/23/16 1432    Clinical Impression Statement Added standing wall squat with ball, step ups and wall pushups with pt able to demonstrate good form  with verbal cuing. Pt will continue to benefit from skilled therapy for core and LE strengthening to improve pain and functional tolerance    PT Next Visit Plan begin Single leg stance next visit       Patient will benefit from skilled therapeutic intervention in order to improve the following deficits and  impairments:     Visit Diagnosis: Radiculopathy, lumbosacral region  Unsteadiness on feet  Muscle weakness (generalized)     Problem List Patient Active Problem List   Diagnosis Date Noted  . Chronic pain syndrome 10/11/2015  . Lumbar radiculopathy 07/25/2015  . Fibroid 10/13/2013  . Adnexal cyst 10/13/2013  . PMB (postmenopausal bleeding) 10/06/2013  . Unspecified symptom associated with female genital organs 10/06/2013  . Hot flashes 10/06/2013  . Internal hemorrhoids with other complication [id number]  . 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 Hip pain 04/09/2013  . Hemorrhoids, internal 03/12/2013  . Back pain 03/11/2013  . Encounter for therapeutic drug monitoring 03/11/2013  . Trochanteric bursitis 03/11/2013  . Lumbar facet arthropathy 03/11/2013  . Unstable angina ([place]) 06/02/2012  . Acute on chronic renal insufficiency ([place]) 06/02/2012  . Chest pain 04/22/2012  . IBS (irritable bowel syndrome)-DIARRHEA 01/23/2012  . Morbid obesity ([place]) 08/09/2011  . Diabetes mellitus ([place]) 08/09/2011  . DIARRHEA 10/11/2010  . Chronic diastolic heart failure ([place]) 12/16/2007  . Pulmonary hypertension ([place]) 12/12/2007  . OSTEOARTHRITIS 12/11/2007  . SLEEP APNEA 12/11/2007    Rayetta Humphrey, PT CLT [phone] 05/23/2016, 2:37 PM  [facility] [address] [place], [place], [address] Phone: [phone]   Fax:  [phone]  Name: Sara Tran MRN: [id number] Date of Birth: 12/24/1956

## 2016-05-25 ENCOUNTER — Telehealth (HOSPITAL_COMMUNITY): Payer: Self-pay | Admitting: Physical Therapy

## 2016-05-25 ENCOUNTER — Ambulatory Visit (HOSPITAL_COMMUNITY): Payer: Commercial Managed Care - HMO | Admitting: Physical Therapy

## 2016-05-25 NOTE — Telephone Encounter (Signed)
She has been sick all morning

## 2016-05-30 ENCOUNTER — Ambulatory Visit (HOSPITAL_COMMUNITY): Payer: Commercial Managed Care - HMO | Admitting: Physical Therapy

## 2016-05-30 DIAGNOSIS — M6281 Muscle weakness (generalized): Secondary | ICD-10-CM | POA: Diagnosis not present

## 2016-05-30 DIAGNOSIS — R2681 Unsteadiness on feet: Secondary | ICD-10-CM

## 2016-05-30 DIAGNOSIS — M5417 Radiculopathy, lumbosacral region: Secondary | ICD-10-CM | POA: Diagnosis not present

## 2016-05-30 NOTE — Therapy (Signed)
[place], [place], [address] Phone: [phone]   Fax:  [phone]  Physical Therapy Treatment  Patient Details  Name: Sara Tran MRN: [id number] Date of Birth: 10/14/1956 Referring Provider: Dr. Alger Simons  Encounter Date: 05/30/2016      PT End of Session - 05/30/16 1443    Visit Number 5   Number of Visits 16   Date for PT Re-Evaluation 05/25/16   Authorization Type [employer] medicare   Authorization - Visit Number 5   Authorization - Number of Visits 10   PT Start Time [id number]   PT Stop Time 1440   PT Time Calculation (min) 52 min   Activity Tolerance Patient tolerated treatment well   Behavior During Therapy [facility] for tasks assessed/performed      Past Medical History  Diagnosis Date  . Type 2 diabetes mellitus ([place])   . Sleep apnea     CPAP use  . Pulmonary HTN ([place])     [place] 2008:  RA pressures 13/10 with a mean of 7 mmHg.  RV pressure 123456 with end diastolic pressure of 15 mmHg.  PA pressure 49/23 with a mean of 35 mmHg.  Pulmonary capillary wedge 17/50 with a mean of 14 mmHg. The PA saturation was 68%.  RA saturation was 71% and aortic saturation was 90%.  Cardiac output was 6.0 with a cardiac index of 2.80 by Fick.  Normal coronaries by cath 2008.  . Morbid obesity ([place])     s/p lap band surgery  . Asthma   . Arthritis   . Hypertriglyceridemia   . COPD (chronic obstructive pulmonary disease) ([place])   . IBS (irritable bowel syndrome)   . Renal insufficiency   . Chronic back pain   . Chronic hip pain   . Sciatica of right side   . Uterine fibroid   . Internal hemorrhoids   . Hot flashes   . Adnexal cyst     Right simple cyst seen on [place]  . Herpes simplex virus (HSV) infection   . History of cardiac catheterization     Normal coronaries 2013    Past Surgical History  Procedure Laterality Date  . Laparoscopic cholecystectomy  1985  . Laparoscopic gastric banding  12/12/2010  .  Pilonidal cyst excision  1974  . Tumor excision  10/2011    Left thigh  . Tumor excision  10/2011    Face  . Colonoscopy  10/18/10    SLF:2-mm sessile sigmoid polyp/diverticula  . Hemorrhoid banding  07/2012    Dr. Oneida Alar  . Cardiac catheterization  07/2007, 05/2012    Normal coronary arteries  . Left and right heart catheterization with coronary angiogram N/A 06/03/2012    Procedure: LEFT AND RIGHT HEART CATHETERIZATION WITH CORONARY ANGIOGRAM;  Surgeon: Jolaine Artist, MD;  Location: [facility] CATH LAB;  Service: Cardiovascular;  Laterality: N/A;    There were no vitals filed for this visit.      Subjective Assessment - 05/30/16 1351    Subjective Pt states she is doing OK today.  States she went to the pool at the [facility] before comoing here to help loosen her up.  STates her pain is elevated now to 9/10   Currently in Pain? Yes   Pain Score 9    Pain Location Leg   Pain Orientation Right;Left   Pain Descriptors / Indicators Aching;Throbbing   Pain Radiating Towards shins to top of feet  [place] Adult PT Treatment/Exercise - 05/30/16 0001    Lumbar Exercises: Standing   Heel Raises 15 reps   Heel Raises Limitations toeraises 10 reps   Functional Squats 15 reps   Forward Lunge 10 reps   Forward Lunge Limitations onto 4" step   Other Standing Lumbar Exercises B LE hip abduction/extension 5# x 15 reps each    Other Standing Lumbar Exercises SLS 6" Rt, 10"Lt LE.  wall pushup x 10 ; wall squat x 10    Lumbar Exercises: Seated   Sit to Stand 5 reps                  PT Short Term Goals - 05/14/16 1616    PT SHORT TERM GOAL #1   Title Pt to improve core and both lower extremity strength by one grade to allow patient to walk for 15 minutes with balck pain not going past a 6/10    Time 4   Period Weeks   Status On-going   PT SHORT TERM GOAL #2   Title PT to be able to come from sit to stand from the couch wth ease    Time 4   Period  Weeks   Status On-going   PT SHORT TERM GOAL #3   Title Pt to be able to complete 10 mintues of light housework with her back pain not incraasing past a 7/10.   Time 4   Period Weeks   Status On-going   PT SHORT TERM GOAL #4   Title Pt to be able to stand for ten minutes for social engagement without her back  pain increasing beyond a 7/10    Time 4   Period Weeks   Status On-going           PT Long Term Goals - 05/14/16 1617    PT LONG TERM GOAL #1   Title Pt core and both lower extremity strength musculature  to be at least a 4/5 to allow pt to be able to ambulate for 25 minutes straight to reduce weight with back pain not exceeding a 4/10   Time 8   Period Weeks   Status On-going   PT LONG TERM GOAL #2   Title Pt single leg stance to be at least 10 seconds bilateally to allow patient to feel confident walking on uneven ground.   Time 8   Period Weeks   Status On-going   PT LONG TERM GOAL #3   Title Patient to be able to go up two flights of steps without her back pain going over 4/10 .   Time 8   Period Weeks   Status On-going   PT LONG TERM GOAL #4   Title Patient to state that she is able to complete 20 minutes of housework without her back pain increasing past 4/10.   Time 8   Period Weeks   Status On-going   PT LONG TERM GOAL #5   Title Patient's back and hip ROM to improve to allow patient to bend and pick an item off the floor with ease.    Time 8   Period Weeks   Status On-going               Plan - 05/30/16 1444    Clinical Impression Statement Continued progression with LE strengthening exericses.  tactile cues required for form with squats and lunges, however much improved and without c/o pain. Began SLS with better balance on Lt LE  and inablity to maintain greater than 10 seconds.   Pt required 2 seated rest breaks during session today    PT Next Visit Plan continue to progress towards goals.       Patient will benefit from skilled therapeutic  intervention in order to improve the following deficits and impairments:     Visit Diagnosis: Radiculopathy, lumbosacral region  Unsteadiness on feet  Muscle weakness (generalized)     Problem List Patient Active Problem List   Diagnosis Date Noted  . Chronic pain syndrome 10/11/2015  . Lumbar radiculopathy 07/25/2015  . Fibroid 10/13/2013  . Adnexal cyst 10/13/2013  . PMB (postmenopausal bleeding) 10/06/2013  . Unspecified symptom associated with female genital organs 10/06/2013  . Hot flashes 10/06/2013  . Internal hemorrhoids with other complication [id number]  . 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 Hip pain 04/09/2013  . Hemorrhoids, internal 03/12/2013  . Back pain 03/11/2013  . Encounter for therapeutic drug monitoring 03/11/2013  . Trochanteric bursitis 03/11/2013  . Lumbar facet arthropathy 03/11/2013  . Unstable angina ([place]) 06/02/2012  . Acute on chronic renal insufficiency ([place]) 06/02/2012  . Chest pain 04/22/2012  . IBS (irritable bowel syndrome)-DIARRHEA 01/23/2012  . Morbid obesity ([place]) 08/09/2011  . Diabetes mellitus ([name]) 08/09/2011  . DIARRHEA 10/11/2010  . Chronic diastolic heart failure ([place]) 12/16/2007  . Pulmonary hypertension ([place]) 12/12/2007  . OSTEOARTHRITIS 12/11/2007  . SLEEP APNEA 12/11/2007    Teena Irani, PTA/CLT [phone]  05/30/2016, 2:47 PM  [facility] [address] [place], [place], [address] Phone: [phone]   Fax:  [phone]  Name: DEMYAH BARBOUR MRN: [id number] Date of Birth: December 18, 1956

## 2016-06-01 ENCOUNTER — Ambulatory Visit (HOSPITAL_COMMUNITY): Payer: Commercial Managed Care - HMO | Attending: Physical Medicine & Rehabilitation

## 2016-06-01 ENCOUNTER — Ambulatory Visit
Admission: RE | Admit: 2016-06-01 | Discharge: 2016-06-01 | Disposition: A | Discharge status: Home / Self Care | Payer: Commercial Managed Care - HMO | Source: Ambulatory Visit

## 2016-06-01 DIAGNOSIS — M6281 Muscle weakness (generalized): Secondary | ICD-10-CM | POA: Diagnosis not present

## 2016-06-01 DIAGNOSIS — M5417 Radiculopathy, lumbosacral region: Secondary | ICD-10-CM | POA: Diagnosis not present

## 2016-06-01 DIAGNOSIS — Z1231 Encounter for screening mammogram for malignant neoplasm of breast: Secondary | ICD-10-CM

## 2016-06-01 DIAGNOSIS — R2681 Unsteadiness on feet: Secondary | ICD-10-CM | POA: Insufficient documentation

## 2016-06-01 NOTE — Therapy (Signed)
[place], [place], [address] Phone: [phone]   Fax:  [phone]  Physical Therapy Treatment  Patient Details  Name: Sara Tran MRN: [id number] Date of Birth: 1956/09/15 Referring Provider: Dr. Alger Simons  Encounter Date: 06/01/2016      PT End of Session - 06/01/16 1416    Visit Number 6   Number of Visits 16   Date for PT Re-Evaluation 05/25/16   Authorization Type [employer] medicare   Authorization - Visit Number 6   Authorization - Number of Visits 10   PT Start Time [id number]   PT Stop Time 1417   PT Time Calculation (min) 30 min   Activity Tolerance Patient tolerated treatment well   Behavior During Therapy [facility] for tasks assessed/performed      Past Medical History  Diagnosis Date  . Type 2 diabetes mellitus ([place])   . Sleep apnea     CPAP use  . Pulmonary HTN ([place])     [place] 2008:  RA pressures 13/10 with a mean of 7 mmHg.  RV pressure 25/49 with end diastolic pressure of 15 mmHg.  PA pressure 49/23 with a mean of 35 mmHg.  Pulmonary capillary wedge 17/50 with a mean of 14 mmHg. The PA saturation was 68%.  RA saturation was 71% and aortic saturation was 90%.  Cardiac output was 6.0 with a cardiac index of 2.80 by Fick.  Normal coronaries by cath 2008.  . Morbid obesity ([place])     s/p lap band surgery  . Asthma   . Arthritis   . Hypertriglyceridemia   . COPD (chronic obstructive pulmonary disease) ([place])   . IBS (irritable bowel syndrome)   . Renal insufficiency   . Chronic back pain   . Chronic hip pain   . Sciatica of right side   . Uterine fibroid   . Internal hemorrhoids   . Hot flashes   . Adnexal cyst     Right simple cyst seen on [place]  . Herpes simplex virus (HSV) infection   . History of cardiac catheterization     Normal coronaries 2013    Past Surgical History  Procedure Laterality Date  . Laparoscopic cholecystectomy  1985  . Laparoscopic gastric banding  12/12/2010  . Pilonidal  cyst excision  1974  . Tumor excision  10/2011    Left thigh  . Tumor excision  10/2011    Face  . Colonoscopy  10/18/10    SLF:2-mm sessile sigmoid polyp/diverticula  . Hemorrhoid banding  07/2012    Dr. Oneida Alar  . Cardiac catheterization  07/2007, 05/2012    Normal coronary arteries  . Left and right heart catheterization with coronary angiogram N/A 06/03/2012    Procedure: LEFT AND RIGHT HEART CATHETERIZATION WITH CORONARY ANGIOGRAM;  Surgeon: Jolaine Artist, MD;  Location[facility] C[facility] LAB;  Service: Cardiovascular;  Laterality: N/A;    There were no vitals filed for this visit.      Subjective Assessment - 06/01/16 1353    Subjective Pt says that she is doing fairly well today, aside from some right knee pain and bilat ankle swelling.    Pertinent History obesity, DM, HTN, COPD    Currently in Pain? Yes   Pain Score 6    Pain Location Back   Pain Type Chronic pain   Pain Onset More than a month ago   Multiple Pain Sites Yes   Pain Score 9   Pain Location Ankle  R knee, bilat ankles   Pain Descriptors / Indicators Aching            [facility] PT Assessment - 06/01/16 0001    Assessment   Medical Diagnosis Radicular back pain   Referring Provider Dr. Alger Simons   Onset Date/Surgical Date 02/15/16   Next MD Visit Not sure   Prior Therapy none   Precautions   Precautions None   Restrictions   Weight Bearing Restrictions No   Balance Screen   Has the patient fallen in the past 6 months No   Has the patient had a decrease in activity level because of a fear of falling?  Yes   Is the patient reluctant to leave their home because of a fear of falling?  No   Prior Function   Level of Independence Independent with household mobility without device;Independent with basic ADLs  limited comunity distances without AD   Vocation On disability   Leisure walking    Cognition   Overall Cognitive Status Within Functional Limits for tasks assessed   Functional Tests   Functional  tests Single leg stance;Sit to Stand   Sit to Stand   Comments 5x STS: 20.45s  was 25.22s on 04/25/16   Posture/Postural Control   Posture/Postural Control Postural limitations   Postural Limitations Rounded Shoulders;Decreased lumbar lordosis;Increased thoracic kyphosis  no changes since evaluation   Strength   Right Hip Flexion 5/5   Right Hip Extension --  limited by habitus; can HS bridge on 21" chair    Right Hip ABduction 3/5   Left Hip Flexion 5/5   Left Hip Extension --  limited by habitus; can HS bridge on 21" chair    Left Hip ABduction 3/5   Right Knee Flexion 3+/5  cog wheel    Right Knee Extension 4+/5   Left Knee Flexion 4+/5   Left Knee Extension 5/5   Right Ankle Dorsiflexion 5/5   Left Ankle Dorsiflexion 5/5                     [facility] Adult PT Treatment/Exercise - 06/01/16 0001    Knee/Hip Exercises: Supine   Short Arc Quad Sets Right;2 sets;10 reps Helps to reduce pain   Other Supine Knee/Hip Exercises Abduction heel slides on 2" step with towel, VC to avoid toe-out 2x10 bilat   Other Supine Knee/Hip Exercises supine LLE IR at 30abd  1x10                  PT Short Term Goals - 06/01/16 1426    PT SHORT TERM GOAL #1   Title Pt to improve core and both lower extremity strength by one grade to allow patient to walk for 15 minutes with back pain < or =  6/10    Time 4   Period Weeks   Status Partially Met   PT SHORT TERM GOAL #2   Title PT to be able to come from sit to stand from the couch wth ease    Time 4   Period Weeks   Status Partially Met   PT SHORT TERM GOAL #3   Title Pt to be able to complete 10 mintues of light housework with her back pain not increasing past a 7/10.   Time 4   Period Weeks   Status Partially Met   PT SHORT TERM GOAL #4   Title Pt to be able to stand for ten minutes for social engagement without her back  pain increasing beyond a 7/10    Time 4   Period Weeks   Status On-going           PT  Long Term Goals - 05/14/16 1617    PT LONG TERM GOAL #1   Title Pt core and both lower extremity strength musculature  to be at least a 4/5 to allow pt to be able to ambulate for 25 minutes straight to reduce weight with back pain not exceeding a 4/10   Time 8   Period Weeks   Status On-going   PT LONG TERM GOAL #2   Title Pt single leg stance to be at least 10 seconds bilateally to allow patient to feel confident walking on uneven ground.   Time 8   Period Weeks   Status On-going   PT LONG TERM GOAL #3   Title Patient to be able to go up two flights of steps without her back pain going over 4/10 .   Time 8   Period Weeks   Status On-going   PT LONG TERM GOAL #4   Title Patient to state that she is able to complete 20 minutes of housework without her back pain increasing past 4/10.   Time 8   Period Weeks   Status On-going   PT LONG TERM GOAL #5   Title Patient's back and hip ROM to improve to allow patient to bend and pick an item off the floor with ease.    Time 8   Period Weeks   Status On-going               Plan - June 20, 2016 1419    Clinical Impression Statement Reassessment done this visit. The patient is making some progress toward goals, seen most in improved pain control, activity tolerance, improved 5x STS, and ability to tolerate gait trial for time. Pt continues to have most difficulty with functional strengthening, especially in the glutes, as well as joint pain from daily use. Will continue with POC as previously outlined in evaluation.    Rehab Potential Fair   PT Frequency 2x / week   PT Duration 8 weeks   PT Treatment/Interventions ADLs/Self Care Home Management;Cryotherapy;Electrical Stimulation;Moist Heat;Ultrasound;Therapeutic exercise;Patient/family education;Balance training   PT Next Visit Plan continue to focus on isolated hip strength and control. Knee pain on right seemed to be alleviated by several sets of SAQ, consider additng weight.    PT Home  Exercise Plan no updates today.    Consulted and Agree with Plan of Care Patient      Patient will benefit from skilled therapeutic intervention in order to improve the following deficits and impairments:  Abnormal gait, Cardiopulmonary status limiting activity, Decreased activity tolerance, Decreased balance, Decreased range of motion, Difficulty walking, Decreased strength, Pain, Obesity  Visit Diagnosis: Radiculopathy, lumbosacral region  Unsteadiness on feet  Muscle weakness (generalized)       G-Codes - 2016-06-20 1427    Functional Assessment Tool Used clinical judgment/ FOTO   Functional Limitation Mobility: Walking and moving around   Mobility: Walking and Moving Around Current Status [phone]) At least 60 percent but less than 80 percent impaired, limited or restricted   Mobility: Walking and Moving Around Goal Status [phone]) At least 40 percent but less than 60 percent impaired, limited or restricted      Problem List Patient Active Problem List   Diagnosis Date Noted  . Chronic pain syndrome 10/11/2015  . Lumbar radiculopathy 07/25/2015  . Fibroid 10/13/2013  .  Adnexal cyst 10/13/2013  . PMB (postmenopausal bleeding) 10/06/2013  . Unspecified symptom associated with female genital organs 10/06/2013  . Hot flashes 10/06/2013  . Internal hemorrhoids with other complication 38/82/6666  . 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 Hip pain 04/09/2013  . Hemorrhoids, internal 03/12/2013  . Back pain 03/11/2013  . Encounter for therapeutic drug monitoring 03/11/2013  . Trochanteric bursitis 03/11/2013  . Lumbar facet arthropathy 03/11/2013  . Unstable angina ([place]) 06/02/2012  . Acute on chronic renal insufficiency ([place]) 06/02/2012  . Chest pain 04/22/2012  . IBS (irritable bowel syndrome)-DIARRHEA 01/23/2012  . Morbid obesity ([place]) 08/09/2011  . Diabetes mellitus ([place]) 08/09/2011  . DIARRHEA 10/11/2010  . Chronic diastolic heart failure ([place]) 12/16/2007  .  Pulmonary hypertension ([place]) 12/12/2007  . OSTEOARTHRITIS 12/11/2007  . SLEEP APNEA 12/11/2007    2:28 PM, 06/01/2016 Etta Grandchild, PT, DPT PRN Physical Therapist at [facility] # [phone] (office)     [facility] [address] [place], [place], [address] Phone: [phone]   Fax:  [phone]  Name: Sara Tran MRN: [id number] Date of Birth: 1956/11/16

## 2016-06-04 ENCOUNTER — Ambulatory Visit (HOSPITAL_COMMUNITY): Payer: Commercial Managed Care - HMO | Admitting: Physical Therapy

## 2016-06-04 DIAGNOSIS — M6281 Muscle weakness (generalized): Secondary | ICD-10-CM | POA: Diagnosis not present

## 2016-06-04 DIAGNOSIS — R2681 Unsteadiness on feet: Secondary | ICD-10-CM

## 2016-06-04 DIAGNOSIS — M5417 Radiculopathy, lumbosacral region: Secondary | ICD-10-CM | POA: Diagnosis not present

## 2016-06-04 NOTE — Therapy (Signed)
[place], [place], [address] Phone: [phone]   Fax:  [phone]  Physical Therapy Treatment  Patient Details  Name: Sara Tran MRN: [id number] Date of Birth: 07/11/1956 Referring Provider: Dr. Alger Simons  Encounter Date: 06/04/2016      PT End of Session - 06/04/16 1500    Visit Number 7   Number of Visits 16   Date for PT Re-Evaluation 07/01/16   Authorization Type [employer] medicare   Authorization - Visit Number 7   Authorization - Number of Visits 10   PT Start Time 0076   PT Stop Time 1505  time does not include nustep    PT Time Calculation (min) 40 min   Equipment Utilized During Treatment Gait belt   Activity Tolerance Patient limited by fatigue      Past Medical History  Diagnosis Date  . Type 2 diabetes mellitus ([place])   . Sleep apnea     CPAP use  . Pulmonary HTN ([place])     [place] 2008:  RA pressures 13/10 with a mean of 7 mmHg.  RV pressure 22/63 with end diastolic pressure of 15 mmHg.  PA pressure 49/23 with a mean of 35 mmHg.  Pulmonary capillary wedge 17/50 with a mean of 14 mmHg. The PA saturation was 68%.  RA saturation was 71% and aortic saturation was 90%.  Cardiac output was 6.0 with a cardiac index of 2.80 by Fick.  Normal coronaries by cath 2008.  . Morbid obesity ([place])     s/p lap band surgery  . Asthma   . Arthritis   . Hypertriglyceridemia   . COPD (chronic obstructive pulmonary disease) ([place])   . IBS (irritable bowel syndrome)   . Renal insufficiency   . Chronic back pain   . Chronic hip pain   . Sciatica of right side   . Uterine fibroid   . Internal hemorrhoids   . Hot flashes   . Adnexal cyst     Right simple cyst seen on [place]  . Herpes simplex virus (HSV) infection   . History of cardiac catheterization     Normal coronaries 2013    Past Surgical History  Procedure Laterality Date  . Laparoscopic cholecystectomy  1985  . Laparoscopic gastric banding   12/12/2010  . Pilonidal cyst excision  1974  . Tumor excision  10/2011    Left thigh  . Tumor excision  10/2011    Face  . Colonoscopy  10/18/10    SLF:2-mm sessile sigmoid polyp/diverticula  . Hemorrhoid banding  07/2012    Dr. Oneida Alar  . Cardiac catheterization  07/2007, 05/2012    Normal coronary arteries  . Left and right heart catheterization with coronary angiogram N/A 06/03/2012    Procedure: LEFT AND RIGHT HEART CATHETERIZATION WITH CORONARY ANGIOGRAM;  Surgeon: Jolaine Artist, MD;  Location: [facility] CATH LAB;  Service: Cardiovascular;  Laterality: N/A;    There were no vitals filed for this visit.      Subjective Assessment - 06/04/16 1418    Subjective Pt states that both feet and her rigtt knee is bothering her today as well as her back    Pertinent History obesity, DM, HTN, COPD    Currently in Pain? Yes   Pain Score 8    Pain Location --  from the waist down    Pain Onset More than a month ago   Pain Frequency Intermittent  with weight bearing  Aggravating Factors  prolong walking    Pain Relieving Factors medication/squats                     [facility] Adult PT Treatment/Exercise - 06/04/16 0001    Lumbar Exercises: Stretches   Prone on Elbows Stretch 60 seconds  radicular sx are better    Press Ups 5 reps   Lumbar Exercises: Aerobic   Stationary Bike nustep Level 3 hills x 10:00   Lumbar Exercises: Standing   Heel Raises 15 reps   Functional Squats 15 reps   Forward Lunge 10 reps   Other Standing Lumbar Exercises B hip extension with forearm support on counter x 10 Reps bilaterally    Other Standing Lumbar Exercises wall pushup x 10 ; wall squat x 10, side step with blue t-band x 2 RT     Lumbar Exercises: Seated   Sit to Stand 10 reps   Lumbar Exercises: Supine   Bridge 15 reps   Straight Leg Raise 10 reps   Lumbar Exercises: Sidelying   Hip Abduction 10 reps   Lumbar Exercises: Prone   Straight Leg Raise 10 reps   Other Prone Lumbar  Exercises heel slides  x 10                   PT Short Term Goals - 06/01/16 1426    PT SHORT TERM GOAL #1   Title Pt to improve core and both lower extremity strength by one grade to allow patient to walk for 15 minutes with back pain < or =  6/10    Time 4   Period Weeks   Status Partially Met   PT SHORT TERM GOAL #2   Title PT to be able to come from sit to stand from the couch wth ease    Time 4   Period Weeks   Status Partially Met   PT SHORT TERM GOAL #3   Title Pt to be able to complete 10 mintues of light housework with her back pain not increasing past a 7/10.   Time 4   Period Weeks   Status Partially Met   PT SHORT TERM GOAL #4   Title Pt to be able to stand for ten minutes for social engagement without her back  pain increasing beyond a 7/10    Time 4   Period Weeks   Status On-going           PT Long Term Goals - 05/14/16 1617    PT LONG TERM GOAL #1   Title Pt core and both lower extremity strength musculature  to be at least a 4/5 to allow pt to be able to ambulate for 25 minutes straight to reduce weight with back pain not exceeding a 4/10   Time 8   Period Weeks   Status On-going   PT LONG TERM GOAL #2   Title Pt single leg stance to be at least 10 seconds bilateally to allow patient to feel confident walking on uneven ground.   Time 8   Period Weeks   Status On-going   PT LONG TERM GOAL #3   Title Patient to be able to go up two flights of steps without her back pain going over 4/10 .   Time 8   Period Weeks   Status On-going   PT LONG TERM GOAL #4   Title Patient to state that she is able to complete 20 minutes of housework without  her back pain increasing past 4/10.   Time 8   Period Weeks   Status On-going   PT LONG TERM GOAL #5   Title Patient's back and hip ROM to improve to allow patient to bend and pick an item off the floor with ease.    Time 8   Period Weeks   Status On-going               Plan - 06/04/16 1503     Clinical Impression Statement Pt progreassed with sidestepping with t-band; sidelying abduction  as well as heel squeezes.  Pt complaining of back, hip, knee and ankle pain probably due to OA.  Therapist stressed the importance of strengthening core mm to decrease stress and pain of joints.    Rehab Potential Fair   PT Frequency 2x / week   PT Duration 8 weeks   PT Treatment/Interventions ADLs/Self Care Home Management;Cryotherapy;Electrical Stimulation;Moist Heat;Ultrasound;Therapeutic exercise;Patient/family education;Balance training   PT Next Visit Plan Add weight to open chain exercises       Patient will benefit from skilled therapeutic intervention in order to improve the following deficits and impairments:  Abnormal gait, Cardiopulmonary status limiting activity, Decreased activity tolerance, Decreased balance, Decreased range of motion, Difficulty walking, Decreased strength, Pain, Obesity  Visit Diagnosis: Radiculopathy, lumbosacral region  Unsteadiness on feet  Muscle weakness (generalized)     Problem List Patient Active Problem List   Diagnosis Date Noted  . Chronic pain syndrome 10/11/2015  . Lumbar radiculopathy 07/25/2015  . Fibroid 10/13/2013  . Adnexal cyst 10/13/2013  . PMB (postmenopausal bleeding) 10/06/2013  . Unspecified symptom associated with female genital organs 10/06/2013  . Hot flashes 10/06/2013  . Internal hemorrhoids with other complication 40/76/8088  . 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 Hip pain 04/09/2013  . Hemorrhoids, internal 03/12/2013  . Back pain 03/11/2013  . Encounter for therapeutic drug monitoring 03/11/2013  . Trochanteric bursitis 03/11/2013  . Lumbar facet arthropathy 03/11/2013  . Unstable angina ([place]) 06/02/2012  . Acute on chronic renal insufficiency ([place]) 06/02/2012  . Chest pain 04/22/2012  . IBS (irritable bowel syndrome)-DIARRHEA 01/23/2012  . Morbid obesity ([place]) 08/09/2011  . Diabetes mellitus ([place])  08/09/2011  . DIARRHEA 10/11/2010  . Chronic diastolic heart failure ([place]) 12/16/2007  . Pulmonary hypertension ([place]) 12/12/2007  . OSTEOARTHRITIS 12/11/2007  . SLEEP APNEA 12/11/2007    Rayetta Humphrey, PT CLT [phone] 06/04/2016, 3:13 PM  [facility] [address] [place], [place], [address] Phone: [phone]   Fax:  [phone]  Name: Sara Tran MRN: [id number] Date of Birth: 01/23/1956

## 2016-06-05 ENCOUNTER — Other Ambulatory Visit: Payer: Self-pay | Admitting: Family Medicine

## 2016-06-05 DIAGNOSIS — R928 Other abnormal and inconclusive findings on diagnostic imaging of breast: Secondary | ICD-10-CM

## 2016-06-06 ENCOUNTER — Ambulatory Visit (HOSPITAL_COMMUNITY): Payer: Commercial Managed Care - HMO

## 2016-06-06 DIAGNOSIS — M6281 Muscle weakness (generalized): Secondary | ICD-10-CM

## 2016-06-06 DIAGNOSIS — R2681 Unsteadiness on feet: Secondary | ICD-10-CM | POA: Diagnosis not present

## 2016-06-06 DIAGNOSIS — M5417 Radiculopathy, lumbosacral region: Secondary | ICD-10-CM | POA: Diagnosis not present

## 2016-06-06 NOTE — Therapy (Signed)
[place], [place], [address] Phone: [phone]   Fax:  [phone]  Physical Therapy Treatment  Patient Details  Name: Sara Tran MRN: [id number] Date of Birth: November 27, 1956 Referring Provider: Dr. Alger Simons  Encounter Date: 06/06/2016      PT End of Session - 06/06/16 1417    Visit Number 8   Number of Visits 16   Date for PT Re-Evaluation 07/01/16   Authorization Type [employer] medicare   Authorization - Visit Number 8   Authorization - Number of Visits 10   PT Start Time 7322   PT Stop Time 1430   PT Time Calculation (min) 43 min   Activity Tolerance No increased pain;Patient limited by fatigue;Patient tolerated treatment well   Behavior During Therapy [facility] for tasks assessed/performed      Past Medical History  Diagnosis Date  . Type 2 diabetes mellitus ([place])   . Sleep apnea     CPAP use  . Pulmonary HTN ([place])     [place] 2008:  RA pressures 13/10 with a mean of 7 mmHg.  RV pressure 02/54 with end diastolic pressure of 15 mmHg.  PA pressure 49/23 with a mean of 35 mmHg.  Pulmonary capillary wedge 17/50 with a mean of 14 mmHg. The PA saturation was 68%.  RA saturation was 71% and aortic saturation was 90%.  Cardiac output was 6.0 with a cardiac index of 2.80 by Fick.  Normal coronaries by cath 2008.  . Morbid obesity ([place])     s/p lap band surgery  . Asthma   . Arthritis   . Hypertriglyceridemia   . COPD (chronic obstructive pulmonary disease) ([place])   . IBS (irritable bowel syndrome)   . Renal insufficiency   . Chronic back pain   . Chronic hip pain   . Sciatica of right side   . Uterine fibroid   . Internal hemorrhoids   . Hot flashes   . Adnexal cyst     Right simple cyst seen on [place]  . Herpes simplex virus (HSV) infection   . History of cardiac catheterization     Normal coronaries 2013    Past Surgical History  Procedure Laterality Date  . Laparoscopic cholecystectomy  1985  .  Laparoscopic gastric banding  12/12/2010  . Pilonidal cyst excision  1974  . Tumor excision  10/2011    Left thigh  . Tumor excision  10/2011    Face  . Colonoscopy  10/18/10    SLF:2-mm sessile sigmoid polyp/diverticula  . Hemorrhoid banding  07/2012    Dr. Oneida Alar  . Cardiac catheterization  07/2007, 05/2012    Normal coronary arteries  . Left and right heart catheterization with coronary angiogram N/A 06/03/2012    Procedure: LEFT AND RIGHT HEART CATHETERIZATION WITH CORONARY ANGIOGRAM;  Surgeon: Jolaine Artist, MD;  Location: [facility] CATH LAB;  Service: Cardiovascular;  Laterality: N/A;    There were no vitals filed for this visit.      Subjective Assessment - 06/06/16 1348    Subjective Pt stated pain scale 7/10 LBP, Bil hip, knees and ankles pain.  Reports pain relief following swiming.   Pertinent History obesity, DM, HTN, COPD    Patient Stated Goals Pt wants to be able to walk again, To be able to travel walking to the airport, to be able to keep driving ( putting foot on the pedal irritates the patient.    Currently in Pain? Yes  Pain Score 7    Pain Location Back  From waist down including Bil hips, knees and ankles    Pain Orientation Right;Left   Pain Descriptors / Indicators Shooting   Pain Type Chronic pain   Pain Radiating Towards Rt LE to ankle worse on thigh, Lt above knee   Pain Onset More than a month ago   Pain Frequency Intermittent   Aggravating Factors  prolong walking   Pain Relieving Factors medication/squats             [facility] Adult PT Treatment/Exercise - 06/06/16 0001    Lumbar Exercises: Stretches   Prone on Elbows Stretch 60 seconds  radicular symptoms improved   Press Ups 5 reps;10 seconds  c/o neck pain   Lumbar Exercises: Standing   Other Standing Lumbar Exercises wall pushup x 10 ; wall squat x 10, side step with blue t-band x 2 RT     Lumbar Exercises: Supine   Bridge 10 reps  2 sets x 10 reps   Straight Leg Raise 10 reps   Straight  Leg Raises Limitations 3#   Other Supine Lumbar Exercises SAQ with 3# with Opposite UE flexion for core activation 15x   Lumbar Exercises: Sidelying   Hip Abduction 10 reps   Hip Abduction Weights (lbs) 3   Hip Abduction Limitations against wall for proper form   Lumbar Exercises: Prone   Straight Leg Raise 10 reps   Other Prone Lumbar Exercises heel squeeze  x 10              PT Short Term Goals - 06/01/16 1426    PT SHORT TERM GOAL #1   Title Pt to improve core and both lower extremity strength by one grade to allow patient to walk for 15 minutes with back pain < or =  6/10    Time 4   Period Weeks   Status Partially Met   PT SHORT TERM GOAL #2   Title PT to be able to come from sit to stand from the couch wth ease    Time 4   Period Weeks   Status Partially Met   PT SHORT TERM GOAL #3   Title Pt to be able to complete 10 mintues of light housework with her back pain not increasing past a 7/10.   Time 4   Period Weeks   Status Partially Met   PT SHORT TERM GOAL #4   Title Pt to be able to stand for ten minutes for social engagement without her back  pain increasing beyond a 7/10    Time 4   Period Weeks   Status On-going           PT Long Term Goals - 05/14/16 1617    PT LONG TERM GOAL #1   Title Pt core and both lower extremity strength musculature  to be at least a 4/5 to allow pt to be able to ambulate for 25 minutes straight to reduce weight with back pain not exceeding a 4/10   Time 8   Period Weeks   Status On-going   PT LONG TERM GOAL #2   Title Pt single leg stance to be at least 10 seconds bilateally to allow patient to feel confident walking on uneven ground.   Time 8   Period Weeks   Status On-going   PT LONG TERM GOAL #3   Title Patient to be able to go up two flights of steps without her back  pain going over 4/10 .   Time 8   Period Weeks   Status On-going   PT LONG TERM GOAL #4   Title Patient to state that she is able to complete 20  minutes of housework without her back pain increasing past 4/10.   Time 8   Period Weeks   Status On-going   PT LONG TERM GOAL #5   Title Patient's back and hip ROM to improve to allow patient to bend and pick an item off the floor with ease.    Time 8   Period Weeks   Status On-going               Plan - 06/06/16 1446    Clinical Impression Statement Added weight with open chain exercises to improve proximal musculature.  Pt limited by generalized pain through session with reports of neck pain in POE, shoulder discomfort with wall push-ups, and c/o back, Bil hips, knees and ankle probably due to OA.  Therapist reviewed importance of strengthening core muscles to decrease stress and pain of joints.  Pt reports pain reduced to 5/10 at EOS.   Rehab Potential Fair   PT Frequency 2x / week   PT Duration 8 weeks   PT Treatment/Interventions ADLs/Self Care Home Management;Cryotherapy;Electrical Stimulation;Moist Heat;Ultrasound;Therapeutic exercise;Patient/family education;Balance training   PT Next Visit Plan Continue progressing PT POC.      Patient will benefit from skilled therapeutic intervention in order to improve the following deficits and impairments:  Abnormal gait, Cardiopulmonary status limiting activity, Decreased activity tolerance, Decreased balance, Decreased range of motion, Difficulty walking, Decreased strength, Pain, Obesity  Visit Diagnosis: Radiculopathy, lumbosacral region  Unsteadiness on feet  Muscle weakness (generalized)     Problem List Patient Active Problem List   Diagnosis Date Noted  . Chronic pain syndrome 10/11/2015  . Lumbar radiculopathy 07/25/2015  . Fibroid 10/13/2013  . Adnexal cyst 10/13/2013  . PMB (postmenopausal bleeding) 10/06/2013  . Unspecified symptom associated with female genital organs 10/06/2013  . Hot flashes 10/06/2013  . Internal hemorrhoids with other complication 95/28/4132  . 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 Hip pain 04/09/2013  . Hemorrhoids, internal 03/12/2013  . Back pain 03/11/2013  . Encounter for therapeutic drug monitoring 03/11/2013  . Trochanteric bursitis 03/11/2013  . Lumbar facet arthropathy 03/11/2013  . Unstable angina ([place]) 06/02/2012  . Acute on chronic renal insufficiency ([place]) 06/02/2012  . Chest pain 04/22/2012  . IBS (irritable bowel syndrome)-DIARRHEA 01/23/2012  . Morbid obesity ([place]) 08/09/2011  . Diabetes mellitus ([place]) 08/09/2011  . DIARRHEA 10/11/2010  . Chronic diastolic heart failure ([place]) 12/16/2007  . Pulmonary hypertension ([place]) 12/12/2007  . OSTEOARTHRITIS 12/11/2007  . SLEEP APNEA 12/11/2007   Ihor Austin, [place]; [place]  [name] 06/06/2016, 3:34 PM  [facility] [address] [place], [place], [address] Phone: [phone]   Fax:  [phone]  Name: KARNE OZGA MRN: [id number] Date of Birth: 1956/11/09

## 2016-06-11 ENCOUNTER — Ambulatory Visit (HOSPITAL_COMMUNITY): Payer: Commercial Managed Care - HMO | Admitting: Physical Therapy

## 2016-06-11 DIAGNOSIS — M5417 Radiculopathy, lumbosacral region: Secondary | ICD-10-CM | POA: Diagnosis not present

## 2016-06-11 DIAGNOSIS — M6281 Muscle weakness (generalized): Secondary | ICD-10-CM | POA: Diagnosis not present

## 2016-06-11 DIAGNOSIS — R2681 Unsteadiness on feet: Secondary | ICD-10-CM

## 2016-06-11 NOTE — Therapy (Signed)
[place], [place], [address] Phone: [phone]   Fax:  [phone]  Physical Therapy Treatment  Patient Details  Name: Sara Tran MRN: [id number] Date of Birth: [birth year] Referring Provider: Dr. Alger Simons  Encounter Date: 06/11/2016      PT End of Session - 06/11/16 1418    Visit Number 9   Number of Visits [place] - Visit Number 9   Authorization - Number of Visits 9   PT Start Time 4492   PT Stop Time 1432   PT Time Calculation (min) 38 min   Equipment Utilized During Treatment Gait belt   Activity Tolerance Patient tolerated treatment well      Past Medical History  Diagnosis Date  . Type 2 diabetes mellitus ([place])   . Sleep apnea     CPAP use  . Pulmonary HTN ([place])     [place] 2008:  RA pressures 13/10 with a mean of 7 mmHg.  RV pressure 01/00 with end diastolic pressure of 15 mmHg.  PA pressure 49/23 with a mean of 35 mmHg.  Pulmonary capillary wedge 17/50 with a mean of 14 mmHg. The PA saturation was 68%.  RA saturation was 71% and aortic saturation was 90%.  Cardiac output was 6.0 with a cardiac index of 2.80 by Fick.  Normal coronaries by cath 2008.  . Morbid obesity ([place])     s/p lap band surgery  . Asthma   . Arthritis   . Hypertriglyceridemia   . COPD (chronic obstructive pulmonary disease) ([place])   . IBS (irritable bowel syndrome)   . Renal insufficiency   . Chronic back pain   . Chronic hip pain   . Sciatica of right side   . Uterine fibroid   . Internal hemorrhoids   . Hot flashes   . Adnexal cyst     Right simple cyst seen on [place]  . Herpes simplex virus (HSV) infection   . History of cardiac catheterization     Normal coronaries 2013    Past Surgical History  Procedure Laterality Date  . Laparoscopic cholecystectomy  1985  . Laparoscopic gastric banding  12/12/2010  . Pilonidal cyst excision  1974  . Tumor excision  10/2011     Left thigh  . Tumor excision  10/2011    Face  . Colonoscopy  10/18/10    SLF:2-mm sessile sigmoid polyp/diverticula  . Hemorrhoid banding  07/2012    Dr. Oneida Alar  . Cardiac catheterization  07/2007, 05/2012    Normal coronary arteries  . Left and right heart catheterization with coronary angiogram N/A 06/03/2012    Procedure: LEFT AND RIGHT HEART CATHETERIZATION WITH CORONARY ANGIOGRAM;  Surgeon: Jolaine Artist, MD;  Location: [facility] CATH LAB;  Service: Cardiovascular;  Laterality: N/A;    There were no vitals filed for this visit.      Subjective Assessment - 06/11/16 1356    Subjective Pt states that she could not drive for a couple of days.  Pt comes in the department carrying her walker when therapist questions why she is carrying her walker pt states she needs it due to her back.  Walker was not unfolded pt  place the walker on the ground and started rolling it to the side as if using a cane.  Pt states "i know I need a driver and a cook".  Therapist disagrees with this.     Pertinent  History obesity, DM, HTN, COPD    How long can you sit comfortably? an hour   How long can you stand comfortably? 5-40mnutes    How long can you walk comfortably? less than five minutes    Currently in Pain? Yes   Pain Score 8    Pain Location Back   Pain Orientation Right;Left   Pain Descriptors / Indicators Aching   Pain Type Chronic pain   Pain Radiating Towards to buttock    Pain Type Chronic pain            [facility] PT Assessment - 06/11/16 0001    Assessment   Medical Diagnosis Radicular back pain   Onset Date/Surgical Date 02/15/16   Next MD Visit Not sure   Prior Therapy none   Precautions   Precautions None   Restrictions   Weight Bearing Restrictions No   Prior Function   Level of Independence Independent with household mobility without device;Independent with basic ADLs  limited comunity distances without AD   Vocation On disability   Leisure walking    Cognition    Overall Cognitive Status Within Functional Limits for tasks assessed   Observation/Other Assessments   Focus on Therapeutic Outcomes (FOTO)  31   was 27 on 4/26; 47 on 06/01/2016   Functional Tests   Functional tests Single leg stance;Sit to Stand   Sit to Stand   Comments 5x STS: 20.45s  was 25.22s on 04/25/16   Posture/Postural Control   Posture/Postural Control Postural limitations   Postural Limitations Rounded Shoulders;Decreased lumbar lordosis;Increased thoracic kyphosis  no changes since evaluation   AROM   Lumbar Flexion Rt 5 seconds'Lt 5  seconds    Lumbar Extension 30  was 24    Strength   Right Hip Flexion 3+/5  was 5/5    Right Hip Extension --   Right Hip ABduction 3/5   Left Hip Flexion 3+/5  was 5/5    Left Hip Extension --  limited by habitus; can HS bridge on 21" chair    Left Hip ABduction 3/5  was 3/5    Right Knee Flexion 3+/5  cog wheel    Right Knee Extension 3/5  no resistance given; was 5/5 4/26; 4+/5 on 06/04/16   Left Knee Flexion 4+/5   Left Knee Extension 3/5  no resistance given; was 4/5 on 4/16; 5/5  on 6/5    Right Ankle Dorsiflexion 5/5   Left Ankle Dorsiflexion 5/5   Ambulation/Gait   Assistive device None                     [facility] Adult PT Treatment/Exercise - 06/11/16 0001    Ambulation/Gait   Ambulation/Gait --   Ambulation Distance (Feet) --   Gait velocity --   Lumbar Exercises: Stretches   Standing Side Bend 5 reps   Lumbar Exercises: Standing   Heel Raises 15 reps   Functional Squats 10 reps   Lumbar Exercises: Supine   Bridge 15 reps   Straight Leg Raise 10 reps   Lumbar Exercises: Sidelying   Hip Abduction 10 reps      All exercises done bilaterally           PT Education - 06/11/16 1555    Education provided Yes   Education Details Encouraged to continue pool therapy as well as HEP   Person(s) Educated Patient   Methods Explanation   Comprehension Verbalized understanding  PT  Short Term Goals - 06/11/16 1417    PT SHORT TERM GOAL #1   Title Pt to improve core and both lower extremity strength by one grade to allow patient to walk for 15 minutes with back pain < or =  6/10    Time 4   Period Weeks   Status Not Met   PT SHORT TERM GOAL #2   Title PT to be able to come from sit to stand from the couch wth ease    Time 4   Period Weeks   Status Not Met   PT SHORT TERM GOAL #3   Title Pt to be able to complete 10 mintues of light housework with her back pain not increasing past a 7/10.   Time 4   Period Weeks   Status Partially Met   PT SHORT TERM GOAL #4   Title Pt to be able to stand for ten minutes for social engagement without her back  pain increasing beyond a 7/10    Time 4   Period Weeks   Status Not Met           PT Long Term Goals - 06/11/16 1418    PT LONG TERM GOAL #1   Title Pt core and both lower extremity strength musculature  to be at least a 4/5 to allow pt to be able to ambulate for 25 minutes straight to reduce weight with back pain not exceeding a 4/10   Time 8   Period Weeks   Status Not Met   PT LONG TERM GOAL #2   Title Pt single leg stance to be at least 10 seconds bilateally to allow patient to feel confident walking on uneven ground.   Time 8   Period Weeks   PT LONG TERM GOAL #3   Title Patient to be able to go up two flights of steps without her back pain going over 4/10 .   Time 8   Period Weeks   Status Not Met   PT LONG TERM GOAL #4   Title Patient to state that she is able to complete 20 minutes of housework without her back pain increasing past 4/10.   Time 8   Period Weeks   Status Not Met   PT LONG TERM GOAL #5   Title Patient's back and hip ROM to improve to allow patient to bend and pick an item off the floor with ease.    Time 8   Period Weeks   Status Not Met               Plan - 06/11/16 1419    Clinical Impression Statement Pt was progressing with function and strength until last week. Pt  now comes in stating that she needs to use a walker to walk, although walker is folded.  Manual mm test has minimal if any resistance.  Therapist urged pt to return to MD as patient was improving but has now she has significantly regressed .  Pt is going to the pool ; therapist encouraged pt to continue to go to the pool and walk    Rehab Potential Fair   PT Frequency 2x / week   PT Duration 6 weeks   PT Treatment/Interventions ADLs/Self Care Home Management;Cryotherapy;Electrical Stimulation;Moist Heat;Ultrasound;Therapeutic exercise;Patient/family education;Balance training   PT Next Visit Plan Pt to be discharged from therapy as we are not making any functional, objective or subjective changes.  Encouraged pt to go back to the  MD and to continue the pool therapy       Patient will benefit from skilled therapeutic intervention in order to improve the following deficits and impairments:  Abnormal gait, Cardiopulmonary status limiting activity, Decreased activity tolerance, Decreased balance, Decreased range of motion, Difficulty walking, Decreased strength, Pain, Obesity  Visit Diagnosis: Radiculopathy, lumbosacral region  Unsteadiness on feet  Muscle weakness (generalized)       G-Codes - 2016-06-24 1550    Functional Assessment Tool Used clinical judgment/ FOTO   Functional Limitation Mobility: Walking and moving around   Mobility: Walking and Moving Around Goal Status [phone]) At least 40 percent but less than 60 percent impaired, limited or restricted   Mobility: Walking and Moving Around Discharge Status [phone]) At least 60 percent but less than 80 percent impaired, limited or restricted      Problem List Patient Active Problem List   Diagnosis Date Noted  . Chronic pain syndrome 10/11/2015  . Lumbar radiculopathy 07/25/2015  . Fibroid 10/13/2013  . Adnexal cyst 10/13/2013  . PMB (postmenopausal bleeding) 10/06/2013  . Unspecified symptom associated with female genital organs  10/06/2013  . Hot flashes 10/06/2013  . Internal hemorrhoids with other complication 54/98/2641  . 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 Hip pain 04/09/2013  . Hemorrhoids, internal 03/12/2013  . Back pain 03/11/2013  . Encounter for therapeutic drug monitoring 03/11/2013  . Trochanteric bursitis 03/11/2013  . Lumbar facet arthropathy 03/11/2013  . Unstable angina ([place]) 06/02/2012  . Acute on chronic renal insufficiency ([place]) 06/02/2012  . Chest pain 04/22/2012  . IBS (irritable bowel syndrome)-DIARRHEA 01/23/2012  . Morbid obesity ([place]) 08/09/2011  . Diabetes mellitus ([place]) 08/09/2011  . DIARRHEA 10/11/2010  . Chronic diastolic heart failure ([place]) 12/16/2007  . Pulmonary hypertension ([place]) 12/12/2007  . OSTEOARTHRITIS 12/11/2007  . SLEEP APNEA 12/11/2007    Rayetta Humphrey, PT CLT [phone] 06/24/16, 3:58 PM  [facility] [address] [place], [place], [address] Phone: [phone]   Fax:  [phone]  Name: Sara Tran MRN: [id number] Date of Birth: May 19, 1956  PHYSICAL THERAPY DISCHARGE SUMMARY  Visits from Start of Care: 9  Current functional level related to goals / functional outcomes: See above    Remaining deficits: See above   Education / Equipment: See above   Plan: Patient agrees to discharge.  Patient goals were not met. Patient is being discharged due to lack of progress.  ?????       Rayetta Humphrey, [place] CLT [phone]

## 2016-06-14 ENCOUNTER — Encounter: Payer: Self-pay | Admitting: Obstetrics and Gynecology

## 2016-06-14 ENCOUNTER — Ambulatory Visit (INDEPENDENT_AMBULATORY_CARE_PROVIDER_SITE_OTHER): Payer: Commercial Managed Care - HMO | Admitting: Obstetrics and Gynecology

## 2016-06-14 VITALS — BP 120/60 | Ht 60.0 in | Wt 286.0 lb

## 2016-06-14 DIAGNOSIS — N950 Postmenopausal bleeding: Secondary | ICD-10-CM
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
## 2016-06-21 ENCOUNTER — Other Ambulatory Visit: Payer: Self-pay | Admitting: Obstetrics and Gynecology

## 2016-06-21 ENCOUNTER — Ambulatory Visit
Admission: RE | Admit: 2016-06-21 | Discharge: 2016-06-21 | Disposition: A | Discharge status: Home / Self Care | Payer: Commercial Managed Care - HMO | Source: Ambulatory Visit | Attending: Family Medicine | Admitting: Family Medicine

## 2016-06-21 DIAGNOSIS — R921 Mammographic calcification found on diagnostic imaging of breast: Secondary | ICD-10-CM | POA: Diagnosis not present

## 2016-06-21 DIAGNOSIS — N950 Postmenopausal bleeding: Secondary | ICD-10-CM

## 2016-06-21 DIAGNOSIS — R928 Other abnormal and inconclusive findings on diagnostic imaging of breast: Secondary | ICD-10-CM

## 2016-06-22 ENCOUNTER — Ambulatory Visit (INDEPENDENT_AMBULATORY_CARE_PROVIDER_SITE_OTHER): Payer: Commercial Managed Care - HMO | Admitting: Obstetrics and Gynecology

## 2016-06-22 ENCOUNTER — Ambulatory Visit (INDEPENDENT_AMBULATORY_CARE_PROVIDER_SITE_OTHER): Payer: Commercial Managed Care - HMO

## 2016-06-22 DIAGNOSIS — D259 Leiomyoma of uterus, unspecified: Secondary | ICD-10-CM

## 2016-06-22 DIAGNOSIS — N950 Postmenopausal bleeding: Secondary | ICD-10-CM

## 2016-06-22 DIAGNOSIS — D251 Intramural leiomyoma of uterus: Secondary | ICD-10-CM | POA: Diagnosis not present

## 2016-06-22 MED ORDER — ESTROGENS, CONJUGATED 0.625 MG/GM VA CREA: 1.0000 | TOPICAL_CREAM | VAGINAL | Status: DC

## 2016-06-22 NOTE — Progress Notes (Signed)
PELVIC US TA/TV: heterogenous anteverted uterus w/mult fibroids,(#1) ant subserosal fibroid 3.1 x 2.1 x 2.8 cm, (#2) ?? Calcified pedunculated fundal fibroid 7.8 x 5.3 cm, normal ov's bilat,no free fluid, EEC 3.8 mm,no pain during ultrasound,limited ultrasound because of pt body habitus

## 2016-06-22 NOTE — Progress Notes (Signed)
[facility] Visit  06/22/2016           Patient name: Sara Tran MRN [id number]  Date of birth: 1956-07-25  CC & HPI:  Sara Tran is a 60 y.o. female presenting today for results of her [place]. Pt notes that her symptoms have decreased since she was seen last, but she is having intermittent spasms. Denies any other symptoms.   ROS:  ROS  +spasms   Pertinent History Reviewed:   Reviewed: Significant for [facility]         Past Medical History  Diagnosis Date  . Type 2 diabetes mellitus ([place])   . Sleep apnea     CPAP use  . Pulmonary HTN ([place])     [place] 2008:  RA pressures 13/10 with a mean of 7 mmHg.  RV pressure 123456 with end diastolic pressure of 15 mmHg.  PA pressure 49/23 with a mean of 35 mmHg.  Pulmonary capillary wedge 17/50 with a mean of 14 mmHg. The PA saturation was 68%.  RA saturation was 71% and aortic saturation was 90%.  Cardiac output was 6.0 with a cardiac index of 2.80 by Fick.  Normal coronaries by cath 2008.  . Morbid obesity ([place])     s/p lap band surgery  . Asthma   . Arthritis   . Hypertriglyceridemia   . COPD (chronic obstructive pulmonary disease) ([place])   . IBS (irritable bowel syndrome)   . Renal insufficiency   . Chronic back pain   . Chronic hip pain   . Sciatica of right side   . Uterine fibroid   . Internal hemorrhoids   . Hot flashes   . Adnexal cyst     Right simple cyst seen on [place]  . Herpes simplex virus (HSV) infection   . History of cardiac catheterization     Normal coronaries 2013                              Surgical Hx:    Past Surgical History  Procedure Laterality Date  . Laparoscopic cholecystectomy  1985  . Laparoscopic gastric banding  12/12/2010  . Pilonidal cyst excision  1974  . Tumor excision  10/2011    Left thigh  . Tumor excision  10/2011    Face  . Colonoscopy  10/18/10    SLF:2-mm sessile sigmoid polyp/diverticula  . Hemorrhoid banding  07/2012    Dr. Oneida Alar  . Cardiac catheterization  07/2007,  05/2012    Normal coronary arteries  . Left and right heart catheterization with coronary angiogram N/A 06/03/2012    Procedure: LEFT AND RIGHT HEART CATHETERIZATION WITH CORONARY ANGIOGRAM;  Surgeon: Jolaine Artist, MD;  Location: [facility] CATH LAB;  Service: Cardiovascular;  Laterality: N/A;   Medications: Reviewed & Updated - see associated section                       Current outpatient prescriptions:  .  albuterol (PROVENTIL HFA;VENTOLIN HFA) 108 (90 BASE) MCG/ACT inhaler, Inhale 2 puffs into the lungs every 6 (six) hours as needed. FOR SHORTNESS OF BREATH/WHEEZING, Disp: , Rfl:  .  albuterol (PROVENTIL) (2.5 MG/3ML) 0.083% nebulizer solution, , Disp: , Rfl:  .  ALPRAZolam (XANAX) 0.5 MG tablet, Take 0.5 mg by mouth 3 (three) times daily as needed. For anxiety/sleep, Disp: , Rfl:  .  amLODipine (NORVASC) 10 MG tablet, Take 1 tablet  by mouth every evening. , Disp: , Rfl:  .  fenofibrate 160 MG tablet, Take 160 mg by mouth daily., Disp: , Rfl: 0 .  fluticasone (FLONASE) 50 MCG/ACT nasal spray, Place 1-2 sprays into the nose daily. , Disp: , Rfl:  .  furosemide (LASIX) 40 MG tablet, Take 40 mg by mouth daily as needed., Disp: , Rfl: 0 .  gabapentin (NEURONTIN) 100 MG capsule, Take 1 capsule (100 mg total) by mouth 3 (three) times daily., Disp: 90 capsule, Rfl: 3 .  ipratropium (ATROVENT) 0.02 % nebulizer solution, Take 0.5 mg by nebulization every 6 (six) hours as needed. , Disp: , Rfl:  .  isosorbide mononitrate (IMDUR) 30 MG 24 hr tablet, Take 30 mg by mouth every morning. , Disp: , Rfl:  .  loperamide (IMODIUM) 2 MG capsule, Take 1 capsule (2 mg total) by mouth as needed for diarrhea or loose stools., Disp: 30 capsule, Rfl: 0 .  loratadine (CLARITIN) 10 MG tablet, Take 10 mg by mouth every morning. , Disp: , Rfl:  .  losartan (COZAAR) 100 MG tablet, Take 100 mg by mouth every morning. , Disp: , Rfl:  .  magnesium oxide (MAG-OX) 400 MG tablet, Take 400 mg by mouth daily., Disp: , Rfl:  .   metFORMIN (GLUCOPHAGE) 500 MG tablet, Take 1 tablet by mouth 2 (two) times daily., Disp: , Rfl:  .  nitroGLYCERIN (NITROSTAT) 0.4 MG SL tablet, Place 0.4 mg under the tongue every 5 (five) minutes as needed. Reported on 06/14/2016, Disp: , Rfl:  .  pantoprazole (PROTONIX) 40 MG tablet, Take 1 tablet (40 mg total) by mouth daily. (Patient not taking: Reported on 06/14/2016), Disp: 30 tablet, Rfl: 0 .  potassium chloride SA (K-DUR,KLOR-CON) 20 MEQ tablet, Take 20 mEq by mouth every morning. , Disp: , Rfl:  .  tiZANidine (ZANAFLEX) 4 MG tablet, Take 4 mg by mouth every 8 (eight) hours as needed for muscle spasms., Disp: , Rfl:  .  traMADol (ULTRAM) 50 MG tablet, Reported on 06/14/2016, Disp: , Rfl: 0 .  triamcinolone cream (KENALOG) 0.1 %, apply to affected area twice a day, Disp: , Rfl: 0   Social History: Reviewed -  reports that she quit smoking about 19 years ago. Her smoking use included Cigarettes. She has never used smokeless tobacco.  Objective Findings:  Vitals: Last menstrual period 06/06/2013.   Discussed with pt risks and benefits of fibroids and [place] results. At end of discussion, pt had opportunity to ask questions and has no further questions at this time.   Greater than 50% was spent in counseling and coordination of care with the patient. Total time greater than: 20 minutes   Assessment & Plan:   A:  1. Postmenopausal bleeding suspected vaginal source 2. Nl endometrium on [place]  P:  1. premarin cream Rx for once weekly use.  2. follow up PRN    By signing my name below, I, Soijett Blue, attest that this documentation has been prepared under the direction and in the presence of Jonnie Kind, MD. Electronically Signed: Soijett Blue, ED Scribe. 06/22/2016. 12:46 PM.  I personally performed the services described in this documentation, which was SCRIBED in my presence. The recorded information has been reviewed and considered accurate. It has been edited as necessary during  review. Jonnie Kind, MD

## 2016-07-10 DIAGNOSIS — J45901 Unspecified asthma with (acute) exacerbation: Secondary | ICD-10-CM | POA: Diagnosis not present

## 2016-07-10 DIAGNOSIS — J4540 Moderate persistent asthma, uncomplicated: Secondary | ICD-10-CM | POA: Diagnosis not present

## 2016-07-10 DIAGNOSIS — Z6841 Body Mass Index (BMI) 40.0 and over, adult: Secondary | ICD-10-CM | POA: Diagnosis not present

## 2016-07-10 DIAGNOSIS — T7589XA Other specified effects of external causes, initial encounter: Secondary | ICD-10-CM | POA: Diagnosis not present

## 2016-07-10 DIAGNOSIS — Z1389 Encounter for screening for other disorder: Secondary | ICD-10-CM | POA: Diagnosis not present

## 2016-07-13 ENCOUNTER — Telehealth: Payer: Self-pay | Admitting: Physical Medicine & Rehabilitation

## 2016-07-13 ENCOUNTER — Telehealth: Payer: Self-pay | Admitting: *Deleted

## 2016-07-13 DIAGNOSIS — M5416 Radiculopathy, lumbar region: Secondary | ICD-10-CM

## 2016-07-13 MED ORDER — GABAPENTIN 100 MG PO CAPS: 100.0000 mg | ORAL_CAPSULE | Freq: Three times a day (TID) | ORAL | Status: DC

## 2016-07-13 NOTE — Telephone Encounter (Signed)
Notified gabapentin refill sent to pharmacy

## 2016-07-13 NOTE — Telephone Encounter (Signed)
Patient has a new pharmacy [facility] in [place] [phone], she needs a refill on Gabapentin.

## 2016-07-25 DIAGNOSIS — E1165 Type 2 diabetes mellitus with hyperglycemia: Secondary | ICD-10-CM | POA: Diagnosis not present

## 2016-07-25 DIAGNOSIS — N390 Urinary tract infection, site not specified: Secondary | ICD-10-CM | POA: Diagnosis not present

## 2016-07-25 DIAGNOSIS — J45909 Unspecified asthma, uncomplicated: Secondary | ICD-10-CM | POA: Diagnosis not present

## 2016-07-25 DIAGNOSIS — G4730 Sleep apnea, unspecified: Secondary | ICD-10-CM | POA: Diagnosis not present

## 2016-07-31 ENCOUNTER — Ambulatory Visit: Payer: Commercial Managed Care - HMO | Admitting: Physical Medicine & Rehabilitation

## 2016-07-31 ENCOUNTER — Encounter
Payer: Commercial Managed Care - HMO | Attending: Physical Medicine & Rehabilitation | Admitting: Physical Medicine & Rehabilitation

## 2016-07-31 ENCOUNTER — Encounter: Payer: Self-pay | Admitting: Physical Medicine & Rehabilitation

## 2016-07-31 ENCOUNTER — Encounter (HOSPITAL_COMMUNITY): Payer: Self-pay

## 2016-07-31 VITALS — BP 136/71 | HR 87 | Temp 98.0°F

## 2016-07-31 DIAGNOSIS — E119 Type 2 diabetes mellitus without complications: Secondary | ICD-10-CM | POA: Diagnosis not present

## 2016-07-31 DIAGNOSIS — M7060 Trochanteric bursitis, unspecified hip: Secondary | ICD-10-CM | POA: Diagnosis not present

## 2016-07-31 DIAGNOSIS — M5416 Radiculopathy, lumbar region: Secondary | ICD-10-CM

## 2016-07-31 DIAGNOSIS — J449 Chronic obstructive pulmonary disease, unspecified: Secondary | ICD-10-CM | POA: Insufficient documentation

## 2016-07-31 DIAGNOSIS — K589 Irritable bowel syndrome without diarrhea: Secondary | ICD-10-CM | POA: Insufficient documentation

## 2016-07-31 DIAGNOSIS — M545 Low back pain: Secondary | ICD-10-CM | POA: Diagnosis not present

## 2016-07-31 DIAGNOSIS — M542 Cervicalgia: Secondary | ICD-10-CM

## 2016-07-31 DIAGNOSIS — M25551 Pain in right hip: Secondary | ICD-10-CM | POA: Diagnosis present

## 2016-07-31 DIAGNOSIS — M47816 Spondylosis without myelopathy or radiculopathy, lumbar region: Secondary | ICD-10-CM

## 2016-07-31 DIAGNOSIS — R209 Unspecified disturbances of skin sensation: Secondary | ICD-10-CM | POA: Insufficient documentation

## 2016-07-31 DIAGNOSIS — M4316 Spondylolisthesis, lumbar region: Secondary | ICD-10-CM | POA: Insufficient documentation

## 2016-07-31 DIAGNOSIS — M7061 Trochanteric bursitis, right hip: Secondary | ICD-10-CM

## 2016-07-31 DIAGNOSIS — G894 Chronic pain syndrome: Secondary | ICD-10-CM

## 2016-07-31 DIAGNOSIS — Z87891 Personal history of nicotine dependence: Secondary | ICD-10-CM | POA: Diagnosis not present

## 2016-07-31 DIAGNOSIS — M25552 Pain in left hip: Secondary | ICD-10-CM | POA: Diagnosis present

## 2016-07-31 MED ORDER — PREDNISONE 1 MG PO TABS: 20.0000 mg | ORAL_TABLET | ORAL | Status: DC

## 2016-07-31 MED ORDER — PREDNISONE 20 MG PO TABS: 20.0000 mg | ORAL_TABLET | ORAL | 0 refills | Status: DC

## 2016-07-31 MED ORDER — CARISOPRODOL 350 MG PO TABS: 350.0000 mg | ORAL_TABLET | Freq: Four times a day (QID) | ORAL | 0 refills | Status: DC | PRN

## 2016-07-31 NOTE — Patient Instructions (Signed)
PLEASE CALL ME WITH ANY PROBLEMS OR QUESTIONS ([phone])  
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
## 2016-08-07 ENCOUNTER — Ambulatory Visit (HOSPITAL_COMMUNITY): Payer: Commercial Managed Care - HMO | Attending: Physical Medicine & Rehabilitation | Admitting: Physical Therapy

## 2016-08-07 DIAGNOSIS — R293 Abnormal posture: Secondary | ICD-10-CM | POA: Insufficient documentation

## 2016-08-07 DIAGNOSIS — M542 Cervicalgia: Secondary | ICD-10-CM | POA: Insufficient documentation

## 2016-08-21 DIAGNOSIS — G4710 Hypersomnia, unspecified: Secondary | ICD-10-CM | POA: Diagnosis not present

## 2016-08-21 DIAGNOSIS — G4733 Obstructive sleep apnea (adult) (pediatric): Secondary | ICD-10-CM | POA: Diagnosis not present

## 2016-08-21 DIAGNOSIS — I272 Other secondary pulmonary hypertension: Secondary | ICD-10-CM | POA: Diagnosis not present

## 2016-08-21 DIAGNOSIS — M159 Polyosteoarthritis, unspecified: Secondary | ICD-10-CM | POA: Diagnosis not present

## 2016-08-22 ENCOUNTER — Ambulatory Visit (HOSPITAL_COMMUNITY): Payer: Commercial Managed Care - HMO

## 2016-08-22 DIAGNOSIS — M542 Cervicalgia: Secondary | ICD-10-CM

## 2016-08-22 DIAGNOSIS — R293 Abnormal posture: Secondary | ICD-10-CM

## 2016-08-22 NOTE — Patient Instructions (Addendum)
Cervical traction poundage is based on 7-10% of your body weight.  Based on that, you should try to set your home unit for 12-28 pounds of pressure.    Today, in the clinic, we stayed at 12 pounds of pressure.   When performing the cervical traction at home, you should try to do it for 10-15 minutes at a time.
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
## 2016-08-29 ENCOUNTER — Ambulatory Visit: Payer: Commercial Managed Care - HMO | Admitting: Physical Medicine & Rehabilitation

## 2016-09-04 ENCOUNTER — Telehealth: Payer: Self-pay

## 2016-09-04 NOTE — Telephone Encounter (Signed)
Pt states that she thinks she needs a nurse at home to help her around the house. Please advise?

## 2016-09-05 NOTE — Telephone Encounter (Signed)
She is not HH eligbile nor will a nurse help with home chores

## 2016-09-05 NOTE — Telephone Encounter (Signed)
Pt advised.

## 2016-09-17 ENCOUNTER — Encounter
Payer: Commercial Managed Care - HMO | Attending: Physical Medicine & Rehabilitation | Admitting: Physical Medicine & Rehabilitation

## 2016-09-17 DIAGNOSIS — M4316 Spondylolisthesis, lumbar region: Secondary | ICD-10-CM | POA: Insufficient documentation

## 2016-09-17 DIAGNOSIS — J449 Chronic obstructive pulmonary disease, unspecified: Secondary | ICD-10-CM | POA: Insufficient documentation

## 2016-09-17 DIAGNOSIS — Z87891 Personal history of nicotine dependence: Secondary | ICD-10-CM | POA: Insufficient documentation

## 2016-09-17 DIAGNOSIS — R209 Unspecified disturbances of skin sensation: Secondary | ICD-10-CM | POA: Insufficient documentation

## 2016-09-17 DIAGNOSIS — M7060 Trochanteric bursitis, unspecified hip: Secondary | ICD-10-CM | POA: Insufficient documentation

## 2016-09-17 DIAGNOSIS — K589 Irritable bowel syndrome without diarrhea: Secondary | ICD-10-CM | POA: Insufficient documentation

## 2016-09-17 DIAGNOSIS — M545 Low back pain: Secondary | ICD-10-CM | POA: Insufficient documentation

## 2016-09-17 DIAGNOSIS — E119 Type 2 diabetes mellitus without complications: Secondary | ICD-10-CM | POA: Insufficient documentation

## 2016-09-21 DIAGNOSIS — G4710 Hypersomnia, unspecified: Secondary | ICD-10-CM | POA: Diagnosis not present

## 2016-09-21 DIAGNOSIS — M159 Polyosteoarthritis, unspecified: Secondary | ICD-10-CM | POA: Diagnosis not present

## 2016-09-21 DIAGNOSIS — I272 Other secondary pulmonary hypertension: Secondary | ICD-10-CM | POA: Diagnosis not present

## 2016-09-21 DIAGNOSIS — G4733 Obstructive sleep apnea (adult) (pediatric): Secondary | ICD-10-CM | POA: Diagnosis not present

## 2016-09-27 DIAGNOSIS — Z6841 Body Mass Index (BMI) 40.0 and over, adult: Secondary | ICD-10-CM | POA: Diagnosis not present

## 2016-09-27 DIAGNOSIS — M1991 Primary osteoarthritis, unspecified site: Secondary | ICD-10-CM | POA: Diagnosis not present

## 2016-09-27 DIAGNOSIS — E1165 Type 2 diabetes mellitus with hyperglycemia: Secondary | ICD-10-CM | POA: Diagnosis not present

## 2016-09-27 DIAGNOSIS — E1140 Type 2 diabetes mellitus with diabetic neuropathy, unspecified: Secondary | ICD-10-CM | POA: Diagnosis not present

## 2016-09-27 DIAGNOSIS — E782 Mixed hyperlipidemia: Secondary | ICD-10-CM | POA: Diagnosis not present

## 2016-09-27 DIAGNOSIS — G4733 Obstructive sleep apnea (adult) (pediatric): Secondary | ICD-10-CM | POA: Diagnosis not present

## 2016-09-27 DIAGNOSIS — Z1389 Encounter for screening for other disorder: Secondary | ICD-10-CM | POA: Diagnosis not present

## 2016-09-27 DIAGNOSIS — M170 Bilateral primary osteoarthritis of knee: Secondary | ICD-10-CM | POA: Diagnosis not present

## 2016-09-27 DIAGNOSIS — J4540 Moderate persistent asthma, uncomplicated: Secondary | ICD-10-CM | POA: Diagnosis not present

## 2016-10-05 DIAGNOSIS — Z1389 Encounter for screening for other disorder: Secondary | ICD-10-CM | POA: Diagnosis not present

## 2016-10-05 DIAGNOSIS — J45901 Unspecified asthma with (acute) exacerbation: Secondary | ICD-10-CM | POA: Diagnosis not present

## 2016-10-05 DIAGNOSIS — Z6841 Body Mass Index (BMI) 40.0 and over, adult: Secondary | ICD-10-CM | POA: Diagnosis not present

## 2016-10-05 DIAGNOSIS — I10 Essential (primary) hypertension: Secondary | ICD-10-CM | POA: Diagnosis not present

## 2016-10-05 DIAGNOSIS — I25700 Atherosclerosis of coronary artery bypass graft(s), unspecified, with unstable angina pectoris: Secondary | ICD-10-CM | POA: Diagnosis not present

## 2016-10-05 DIAGNOSIS — G894 Chronic pain syndrome: Secondary | ICD-10-CM | POA: Diagnosis not present

## 2016-10-08 ENCOUNTER — Ambulatory Visit: Payer: Commercial Managed Care - HMO | Admitting: Physical Medicine & Rehabilitation

## 2016-10-10 ENCOUNTER — Encounter: Payer: Commercial Managed Care - HMO | Admitting: Registered Nurse

## 2016-10-12 ENCOUNTER — Ambulatory Visit: Payer: Commercial Managed Care - HMO | Admitting: Registered Nurse

## 2016-10-21 DIAGNOSIS — G4710 Hypersomnia, unspecified: Secondary | ICD-10-CM | POA: Diagnosis not present

## 2016-10-21 DIAGNOSIS — E785 Hyperlipidemia, unspecified: Secondary | ICD-10-CM | POA: Diagnosis not present

## 2016-10-21 DIAGNOSIS — M159 Polyosteoarthritis, unspecified: Secondary | ICD-10-CM | POA: Diagnosis not present

## 2016-10-21 DIAGNOSIS — G4733 Obstructive sleep apnea (adult) (pediatric): Secondary | ICD-10-CM | POA: Diagnosis not present

## 2016-10-24 ENCOUNTER — Telehealth: Payer: Self-pay | Admitting: *Deleted

## 2016-10-24 DIAGNOSIS — M542 Cervicalgia: Secondary | ICD-10-CM

## 2016-10-24 DIAGNOSIS — G894 Chronic pain syndrome: Secondary | ICD-10-CM

## 2016-10-24 DIAGNOSIS — M47816 Spondylosis without myelopathy or radiculopathy, lumbar region: Secondary | ICD-10-CM

## 2016-10-24 DIAGNOSIS — M7061 Trochanteric bursitis, right hip: Secondary | ICD-10-CM

## 2016-10-24 DIAGNOSIS — M5416 Radiculopathy, lumbar region: Secondary | ICD-10-CM

## 2016-10-24 MED ORDER — CARISOPRODOL 350 MG PO TABS: 350.0000 mg | ORAL_TABLET | Freq: Four times a day (QID) | ORAL | 1 refills | Status: DC | PRN

## 2016-10-24 NOTE — Telephone Encounter (Signed)
Patient left message asking for refill on carisoprodol Physiological scientist)

## 2016-10-31 ENCOUNTER — Encounter (HOSPITAL_COMMUNITY): Payer: Self-pay

## 2016-11-07 ENCOUNTER — Telehealth: Payer: Self-pay | Admitting: Physical Medicine & Rehabilitation

## 2016-11-07 NOTE — Telephone Encounter (Signed)
Patient is saying that she needs prior approval from her insurance for the Traction Kit, the phone number to call is [phone] [facility]).

## 2016-11-21 ENCOUNTER — Encounter: Payer: Self-pay | Admitting: Physical Medicine & Rehabilitation

## 2016-11-21 ENCOUNTER — Encounter
Payer: Commercial Managed Care - HMO | Attending: Physical Medicine & Rehabilitation | Admitting: Physical Medicine & Rehabilitation

## 2016-11-21 VITALS — BP 142/83 | HR 92 | Resp 14

## 2016-11-21 DIAGNOSIS — Z87891 Personal history of nicotine dependence: Secondary | ICD-10-CM | POA: Diagnosis not present

## 2016-11-21 DIAGNOSIS — M159 Polyosteoarthritis, unspecified: Secondary | ICD-10-CM | POA: Diagnosis not present

## 2016-11-21 DIAGNOSIS — M7060 Trochanteric bursitis, unspecified hip: Secondary | ICD-10-CM | POA: Diagnosis not present

## 2016-11-21 DIAGNOSIS — K589 Irritable bowel syndrome without diarrhea: Secondary | ICD-10-CM | POA: Diagnosis not present

## 2016-11-21 DIAGNOSIS — G4733 Obstructive sleep apnea (adult) (pediatric): Secondary | ICD-10-CM | POA: Diagnosis not present

## 2016-11-21 DIAGNOSIS — M1288 Other specific arthropathies, not elsewhere classified, other specified site: Secondary | ICD-10-CM

## 2016-11-21 DIAGNOSIS — M25551 Pain in right hip: Secondary | ICD-10-CM | POA: Diagnosis present

## 2016-11-21 DIAGNOSIS — M545 Low back pain: Secondary | ICD-10-CM | POA: Insufficient documentation

## 2016-11-21 DIAGNOSIS — E119 Type 2 diabetes mellitus without complications: Secondary | ICD-10-CM | POA: Insufficient documentation

## 2016-11-21 DIAGNOSIS — M4316 Spondylolisthesis, lumbar region: Secondary | ICD-10-CM | POA: Insufficient documentation

## 2016-11-21 DIAGNOSIS — M5416 Radiculopathy, lumbar region: Secondary | ICD-10-CM | POA: Diagnosis not present

## 2016-11-21 DIAGNOSIS — M542 Cervicalgia: Secondary | ICD-10-CM | POA: Diagnosis not present

## 2016-11-21 DIAGNOSIS — M25552 Pain in left hip: Secondary | ICD-10-CM | POA: Diagnosis present

## 2016-11-21 DIAGNOSIS — G4710 Hypersomnia, unspecified: Secondary | ICD-10-CM | POA: Diagnosis not present

## 2016-11-21 DIAGNOSIS — J449 Chronic obstructive pulmonary disease, unspecified: Secondary | ICD-10-CM | POA: Insufficient documentation

## 2016-11-21 DIAGNOSIS — R209 Unspecified disturbances of skin sensation: Secondary | ICD-10-CM | POA: Diagnosis not present

## 2016-11-21 DIAGNOSIS — M47816 Spondylosis without myelopathy or radiculopathy, lumbar region: Secondary | ICD-10-CM

## 2016-11-21 NOTE — Patient Instructions (Signed)
PLEASE CALL ME WITH ANY PROBLEMS OR QUESTIONS ([phone])   HAPPY THANKSGIVING!!!!    
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
## 2016-12-04 ENCOUNTER — Telehealth: Payer: Self-pay

## 2016-12-04 NOTE — Telephone Encounter (Signed)
Patient called asking for a refill on Soma medication.

## 2016-12-13 ENCOUNTER — Other Ambulatory Visit: Payer: Self-pay | Admitting: Physical Medicine & Rehabilitation

## 2016-12-13 ENCOUNTER — Encounter (HOSPITAL_COMMUNITY): Payer: Commercial Managed Care - HMO | Admitting: Internal Medicine

## 2016-12-13 DIAGNOSIS — M47816 Spondylosis without myelopathy or radiculopathy, lumbar region: Secondary | ICD-10-CM

## 2016-12-13 DIAGNOSIS — G894 Chronic pain syndrome: Secondary | ICD-10-CM

## 2016-12-13 DIAGNOSIS — M7061 Trochanteric bursitis, right hip: Secondary | ICD-10-CM

## 2016-12-13 DIAGNOSIS — M5416 Radiculopathy, lumbar region: Secondary | ICD-10-CM

## 2016-12-13 DIAGNOSIS — M542 Cervicalgia: Secondary | ICD-10-CM

## 2016-12-13 NOTE — Telephone Encounter (Signed)
Patient Notified of Rx refill (soma) patient understands she must keep her appointment 01/21/2017 with Dr. Naaman Plummer. Patient understood and was very appreciative of the call.

## 2016-12-14 ENCOUNTER — Other Ambulatory Visit: Payer: Self-pay

## 2016-12-21 DIAGNOSIS — G4733 Obstructive sleep apnea (adult) (pediatric): Secondary | ICD-10-CM | POA: Diagnosis not present

## 2016-12-21 DIAGNOSIS — G4710 Hypersomnia, unspecified: Secondary | ICD-10-CM | POA: Diagnosis not present

## 2016-12-21 DIAGNOSIS — M159 Polyosteoarthritis, unspecified: Secondary | ICD-10-CM | POA: Diagnosis not present

## 2017-01-09 ENCOUNTER — Telehealth (HOSPITAL_COMMUNITY): Payer: Self-pay | Admitting: Vascular Surgery

## 2017-01-09 NOTE — Telephone Encounter (Signed)
Left pt message, pt cancel 01/11/17 appt via automated service, left message to get pt resch

## 2017-01-11 ENCOUNTER — Encounter (HOSPITAL_COMMUNITY): Payer: Commercial Managed Care - HMO | Admitting: Internal Medicine

## 2017-01-18 DIAGNOSIS — J029 Acute pharyngitis, unspecified: Secondary | ICD-10-CM | POA: Diagnosis not present

## 2017-01-18 DIAGNOSIS — Z1389 Encounter for screening for other disorder: Secondary | ICD-10-CM | POA: Diagnosis not present

## 2017-01-18 DIAGNOSIS — Z6841 Body Mass Index (BMI) 40.0 and over, adult: Secondary | ICD-10-CM | POA: Diagnosis not present

## 2017-01-18 DIAGNOSIS — B349 Viral infection, unspecified: Secondary | ICD-10-CM | POA: Diagnosis not present

## 2017-01-18 DIAGNOSIS — J069 Acute upper respiratory infection, unspecified: Secondary | ICD-10-CM | POA: Diagnosis not present

## 2017-01-21 ENCOUNTER — Encounter: Payer: Medicare HMO | Admitting: Physical Medicine & Rehabilitation

## 2017-01-21 DIAGNOSIS — G4710 Hypersomnia, unspecified: Secondary | ICD-10-CM | POA: Diagnosis not present

## 2017-01-21 DIAGNOSIS — G4733 Obstructive sleep apnea (adult) (pediatric): Secondary | ICD-10-CM | POA: Diagnosis not present

## 2017-01-21 DIAGNOSIS — M159 Polyosteoarthritis, unspecified: Secondary | ICD-10-CM | POA: Diagnosis not present

## 2017-02-01 ENCOUNTER — Ambulatory Visit (HOSPITAL_COMMUNITY)
Admission: RE | Admit: 2017-02-01 | Discharge: 2017-02-01 | Disposition: A | Discharge status: Home / Self Care | Payer: Medicare HMO | Source: Ambulatory Visit | Attending: Pulmonary Disease | Admitting: Pulmonary Disease

## 2017-02-01 ENCOUNTER — Other Ambulatory Visit (HOSPITAL_COMMUNITY): Payer: Self-pay | Admitting: Pulmonary Disease

## 2017-02-01 DIAGNOSIS — R05 Cough: Secondary | ICD-10-CM

## 2017-02-01 DIAGNOSIS — G4733 Obstructive sleep apnea (adult) (pediatric): Secondary | ICD-10-CM | POA: Diagnosis not present

## 2017-02-01 DIAGNOSIS — E669 Obesity, unspecified: Secondary | ICD-10-CM | POA: Diagnosis not present

## 2017-02-01 DIAGNOSIS — R0689 Other abnormalities of breathing: Secondary | ICD-10-CM | POA: Diagnosis not present

## 2017-02-01 DIAGNOSIS — J441 Chronic obstructive pulmonary disease with (acute) exacerbation: Secondary | ICD-10-CM | POA: Diagnosis not present

## 2017-02-01 DIAGNOSIS — R059 Cough, unspecified: Secondary | ICD-10-CM

## 2017-02-11 ENCOUNTER — Encounter: Payer: Medicare HMO | Admitting: Physical Medicine & Rehabilitation

## 2017-02-11 ENCOUNTER — Other Ambulatory Visit: Payer: Self-pay

## 2017-02-11 DIAGNOSIS — M47816 Spondylosis without myelopathy or radiculopathy, lumbar region: Secondary | ICD-10-CM

## 2017-02-11 DIAGNOSIS — M5416 Radiculopathy, lumbar region: Secondary | ICD-10-CM

## 2017-02-11 DIAGNOSIS — M542 Cervicalgia: Secondary | ICD-10-CM

## 2017-02-11 DIAGNOSIS — M7061 Trochanteric bursitis, right hip: Secondary | ICD-10-CM

## 2017-02-11 DIAGNOSIS — G894 Chronic pain syndrome: Secondary | ICD-10-CM

## 2017-02-11 NOTE — Telephone Encounter (Signed)
error 

## 2017-02-13 MED ORDER — CARISOPRODOL 350 MG PO TABS: ORAL_TABLET | ORAL | 1 refills | Status: DC

## 2017-02-21 DIAGNOSIS — G4710 Hypersomnia, unspecified: Secondary | ICD-10-CM | POA: Diagnosis not present

## 2017-02-21 DIAGNOSIS — G4733 Obstructive sleep apnea (adult) (pediatric): Secondary | ICD-10-CM | POA: Diagnosis not present

## 2017-02-21 DIAGNOSIS — M159 Polyosteoarthritis, unspecified: Secondary | ICD-10-CM | POA: Diagnosis not present

## 2017-03-21 DIAGNOSIS — G4710 Hypersomnia, unspecified: Secondary | ICD-10-CM | POA: Diagnosis not present

## 2017-03-21 DIAGNOSIS — G4733 Obstructive sleep apnea (adult) (pediatric): Secondary | ICD-10-CM | POA: Diagnosis not present

## 2017-03-21 DIAGNOSIS — M159 Polyosteoarthritis, unspecified: Secondary | ICD-10-CM | POA: Diagnosis not present

## 2017-04-04 ENCOUNTER — Other Ambulatory Visit: Payer: Self-pay | Admitting: Physical Medicine & Rehabilitation

## 2017-04-04 DIAGNOSIS — M5416 Radiculopathy, lumbar region: Secondary | ICD-10-CM

## 2017-04-11 ENCOUNTER — Telehealth: Payer: Self-pay | Admitting: *Deleted

## 2017-04-11 DIAGNOSIS — M7061 Trochanteric bursitis, right hip: Secondary | ICD-10-CM

## 2017-04-11 DIAGNOSIS — M47816 Spondylosis without myelopathy or radiculopathy, lumbar region: Secondary | ICD-10-CM

## 2017-04-11 DIAGNOSIS — G894 Chronic pain syndrome: Secondary | ICD-10-CM

## 2017-04-11 DIAGNOSIS — M542 Cervicalgia: Secondary | ICD-10-CM

## 2017-04-11 DIAGNOSIS — M5416 Radiculopathy, lumbar region: Secondary | ICD-10-CM

## 2017-04-11 NOTE — Telephone Encounter (Signed)
Fayrene called and asked if she could get a refill on the prednisone Dr Naaman Plummer gave her.  She says she is making an appt but one is not available until 06/18/17. She was last seen in November and was supposed to return in 3 months. She canceled her February appt.

## 2017-04-12 ENCOUNTER — Other Ambulatory Visit: Payer: Self-pay

## 2017-04-12 DIAGNOSIS — M5416 Radiculopathy, lumbar region: Secondary | ICD-10-CM

## 2017-04-12 DIAGNOSIS — M47816 Spondylosis without myelopathy or radiculopathy, lumbar region: Secondary | ICD-10-CM

## 2017-04-12 DIAGNOSIS — G894 Chronic pain syndrome: Secondary | ICD-10-CM

## 2017-04-12 DIAGNOSIS — M542 Cervicalgia: Secondary | ICD-10-CM

## 2017-04-12 DIAGNOSIS — M7061 Trochanteric bursitis, right hip: Secondary | ICD-10-CM

## 2017-04-12 MED ORDER — PREDNISONE 20 MG PO TABS: 20.0000 mg | ORAL_TABLET | ORAL | 0 refills | Status: DC

## 2017-04-12 NOTE — Telephone Encounter (Signed)
Can RF the prednisone x 1

## 2017-04-21 DIAGNOSIS — G4733 Obstructive sleep apnea (adult) (pediatric): Secondary | ICD-10-CM | POA: Diagnosis not present

## 2017-04-21 DIAGNOSIS — G4710 Hypersomnia, unspecified: Secondary | ICD-10-CM | POA: Diagnosis not present

## 2017-04-21 DIAGNOSIS — M159 Polyosteoarthritis, unspecified: Secondary | ICD-10-CM | POA: Diagnosis not present

## 2017-04-22 DIAGNOSIS — E119 Type 2 diabetes mellitus without complications: Secondary | ICD-10-CM | POA: Diagnosis not present

## 2017-05-04 ENCOUNTER — Emergency Department (HOSPITAL_COMMUNITY)
Admission: EM | Admit: 2017-05-04 | Discharge: 2017-05-04 | Disposition: A | Discharge status: Home / Self Care | Payer: Medicare HMO | Attending: Emergency Medicine | Admitting: Emergency Medicine

## 2017-05-04 ENCOUNTER — Encounter (HOSPITAL_COMMUNITY): Payer: Self-pay | Admitting: Emergency Medicine

## 2017-05-04 DIAGNOSIS — T7840XA Allergy, unspecified, initial encounter: Secondary | ICD-10-CM | POA: Diagnosis not present

## 2017-05-04 DIAGNOSIS — J449 Chronic obstructive pulmonary disease, unspecified: Secondary | ICD-10-CM | POA: Insufficient documentation

## 2017-05-04 DIAGNOSIS — Z87891 Personal history of nicotine dependence: Secondary | ICD-10-CM | POA: Diagnosis not present

## 2017-05-04 DIAGNOSIS — Z91040 Latex allergy status: Secondary | ICD-10-CM | POA: Insufficient documentation

## 2017-05-04 DIAGNOSIS — Z7984 Long term (current) use of oral hypoglycemic drugs: Secondary | ICD-10-CM | POA: Insufficient documentation

## 2017-05-04 DIAGNOSIS — Z955 Presence of coronary angioplasty implant and graft: Secondary | ICD-10-CM | POA: Insufficient documentation

## 2017-05-04 DIAGNOSIS — I5032 Chronic diastolic (congestive) heart failure: Secondary | ICD-10-CM | POA: Diagnosis not present

## 2017-05-04 DIAGNOSIS — Z79899 Other long term (current) drug therapy: Secondary | ICD-10-CM | POA: Insufficient documentation

## 2017-05-04 DIAGNOSIS — N189 Chronic kidney disease, unspecified: Secondary | ICD-10-CM | POA: Insufficient documentation

## 2017-05-04 DIAGNOSIS — T63441A Toxic effect of venom of bees, accidental (unintentional), initial encounter: Secondary | ICD-10-CM | POA: Diagnosis not present

## 2017-05-04 DIAGNOSIS — E1122 Type 2 diabetes mellitus with diabetic chronic kidney disease: Secondary | ICD-10-CM | POA: Diagnosis not present

## 2017-05-04 MED ORDER — METHYLPREDNISOLONE SODIUM SUCC 125 MG IJ SOLR
125.0000 mg | Freq: Once | INTRAMUSCULAR | Status: AC
  Administered 2017-05-04: 125 mg via INTRAVENOUS

## 2017-05-04 MED ORDER — FAMOTIDINE IN NACL 20-0.9 MG/50ML-% IV SOLN
INTRAVENOUS | Status: AC
  Filled 2017-05-04: qty 50

## 2017-05-04 MED ORDER — FAMOTIDINE IN NACL 20-0.9 MG/50ML-% IV SOLN
20.0000 mg | Freq: Once | INTRAVENOUS | Status: AC
  Administered 2017-05-04: 20 mg via INTRAVENOUS

## 2017-05-04 MED ORDER — SODIUM CHLORIDE 0.9 % IV BOLUS (SEPSIS)
500.0000 mL | Freq: Once | INTRAVENOUS | Status: AC
  Administered 2017-05-04: 500 mL via INTRAVENOUS

## 2017-05-04 MED ORDER — DIPHENHYDRAMINE HCL 50 MG/ML IJ SOLN
INTRAMUSCULAR | Status: AC
  Filled 2017-05-04: qty 1

## 2017-05-04 MED ORDER — DIPHENHYDRAMINE HCL 50 MG/ML IJ SOLN
25.0000 mg | Freq: Once | INTRAMUSCULAR | Status: AC
  Administered 2017-05-04: 25 mg via INTRAVENOUS

## 2017-05-04 MED ORDER — METHYLPREDNISOLONE SODIUM SUCC 125 MG IJ SOLR
INTRAMUSCULAR | Status: AC
  Filled 2017-05-04: qty 2

## 2017-05-04 NOTE — ED Notes (Signed)
Pt states understanding of d/c instructions, follow up care, and prescription use. Denies further questions or concerns at this time. Ambulatory to d/c.

## 2017-05-04 NOTE — ED Provider Notes (Signed)
[facility] DEPT Provider Note   CSN: [id number] Arrival date & time: 05/04/17  1222     History   Chief Complaint Chief Complaint  Patient presents with  . Allergic Reaction    HPI Sara Tran is a 61 y.o. female.  Level V caveat for urgent need for intervention. Patient is complaining of an allergic reaction after getting stung by a bee on her left knee. 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 She initially stated that it felt like "cotton in her mouth", but no wheezing or difficulty swallowing. She took nothing for this at home yet. Severity of symptoms is mild to moderate      Past Medical History:  Diagnosis Date  . Adnexal cyst    Right simple cyst seen on [place]  . Arthritis   . Asthma   . Chronic back pain   . Chronic hip pain   . COPD (chronic obstructive pulmonary disease) ([place])   . Herpes simplex virus (HSV) infection   . History of cardiac catheterization    Normal coronaries 2013  . Hot flashes   . Hypertriglyceridemia   . IBS (irritable bowel syndrome)   . Internal hemorrhoids   . Morbid obesity ([place])    s/p lap band surgery  . Pulmonary HTN ([place])    [place] 2008:  RA pressures 13/10 with a mean of 7 mmHg.  RV pressure 68/03 with end diastolic pressure of 15 mmHg.  PA pressure 49/23 with a mean of 35 mmHg.  Pulmonary capillary wedge 17/50 with a mean of 14 mmHg. The PA saturation was 68%.  RA saturation was 71% and aortic saturation was 90%.  Cardiac output was 6.0 with a cardiac index of 2.80 by Fick.  Normal coronaries by cath 2008.  Marland Kitchen Renal insufficiency   . Sciatica of right side   . Sleep apnea    CPAP use  . Type 2 diabetes mellitus ([place])   . Uterine fibroid     Patient Active Problem List   Diagnosis Date Noted  . Cervicalgia 11/21/2016  . Chronic pain syndrome 10/11/2015  . Lumbar radiculopathy 07/25/2015  . Fibroid 10/13/2013  . Adnexal cyst 10/13/2013  . PMB (postmenopausal bleeding) 10/06/2013  . Unspecified symptom associated  with female genital organs 10/06/2013  . Hot flashes 10/06/2013  . Internal hemorrhoids with other complication 21/22/4825  . 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 Hip pain 04/09/2013  . Hemorrhoids, internal 03/12/2013  . Back pain 03/11/2013  . Encounter for therapeutic drug monitoring 03/11/2013  . Trochanteric bursitis 03/11/2013  . Lumbar facet arthropathy ([place]) 03/11/2013  . Unstable angina ([place]) 06/02/2012  . Acute on chronic renal insufficiency 06/02/2012  . Chest pain 04/22/2012  . IBS (irritable bowel syndrome)-DIARRHEA 01/23/2012  . Morbid obesity ([place]) 08/09/2011  . Diabetes mellitus ([place]) 08/09/2011  . DIARRHEA 10/11/2010  . Chronic diastolic heart failure ([place]) 12/16/2007  . Pulmonary hypertension ([place]) 12/12/2007  . OSTEOARTHRITIS 12/11/2007  . SLEEP APNEA 12/11/2007    Past Surgical History:  Procedure Laterality Date  . CARDIAC CATHETERIZATION  07/2007, 05/2012   Normal coronary arteries  . COLONOSCOPY  10/18/10   SLF:2-mm sessile sigmoid polyp/diverticula  . HEMORRHOID BANDING  07/2012   Dr. Oneida Alar  . LAPAROSCOPIC CHOLECYSTECTOMY  1985  . LAPAROSCOPIC GASTRIC BANDING  12/12/2010  . LEFT AND RIGHT HEART CATHETERIZATION WITH CORONARY ANGIOGRAM N/A 06/03/2012   Procedure: LEFT AND RIGHT HEART CATHETERIZATION WITH CORONARY ANGIOGRAM;  Surgeon: Jolaine Artist, MD;  Location: [facility]  CATH LAB;  Service: Cardiovascular;  Laterality: N/A;  . PILONIDAL CYST EXCISION  1974  . 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 TUMOR EXCISION  10/2011   Face    OB History    Gravida Para Term Preterm AB Living   0 0 0 0 0 0   SAB TAB Ectopic Multiple Live Births   0 0 0 0         Home Medications    Prior to Admission medications   Medication Sig Start Date End Date Taking? Authorizing Provider  albuterol (PROVENTIL HFA;VENTOLIN HFA) 108 (90 BASE) MCG/ACT inhaler Inhale 2 puffs into the lungs every 6 (six) hours as needed. FOR SHORTNESS OF BREATH/WHEEZING   Yes  [provider]  albuterol (PROVENTIL) (2.5 MG/3ML) 0.083% nebulizer solution Take 2.5 mg by nebulization every 4 (four) hours as needed for wheezing or shortness of breath.  09/19/15  Yes [provider]  ALPRAZolam Duanne Moron) 0.5 MG tablet Take 0.5 mg by mouth 3 (three) times daily as needed. For anxiety/sleep 11/24/14  Yes [provider]  amLODipine (NORVASC) 10 MG tablet Take 1 tablet by mouth every evening.  04/10/13  Yes [provider]  carisoprodol (SOMA) 350 MG tablet TAKE 1 TABLET 4 TIMES A DAY AS NEEDED FOR MUSCLE SPASM. 02/13/17  Yes Meredith Staggers, MD  Cholecalciferol (VITAMIN D3 PO) Take 1 capsule by mouth daily.   Yes [provider]  fenofibrate 160 MG tablet Take 160 mg by mouth daily. 07/21/15  Yes [provider]  fluticasone (FLONASE) 50 MCG/ACT nasal spray Place 1-2 sprays into the nose daily.    Yes [provider]  gabapentin (NEURONTIN) 300 MG capsule Take 300 mg by mouth 3 (three) times daily.   Yes [provider]  ipratropium (ATROVENT) 0.02 % nebulizer solution Take 0.5 mg by nebulization every 6 (six) hours as needed.  09/19/15  Yes [provider]  isosorbide mononitrate (IMDUR) 30 MG 24 hr tablet Take 30 mg by mouth every morning.  09/05/11  Yes [provider]  loratadine (CLARITIN) 10 MG tablet Take 10 mg by mouth every morning.    Yes [provider]  losartan (COZAAR) 100 MG tablet Take 100 mg by mouth every morning.    Yes [provider]  magnesium oxide (MAG-OX) 400 MG tablet Take 400 mg by mouth daily.   Yes [provider]  metFORMIN (GLUCOPHAGE) 500 MG tablet Take 1 tablet by mouth 2 (two) times daily. 02/13/13  Yes [provider]  Multiple Vitamins-Minerals (ONE-A-DAY 50 PLUS PO) Take 1 tablet by mouth daily.   Yes [provider]  nitroGLYCERIN (NITROSTAT) 0.4 MG SL tablet Place 0.4 mg under the tongue every 5 (five) minutes as  needed. Reported on 06/14/2016   Yes [provider]  potassium chloride SA (K-DUR,KLOR-CON) 20 MEQ tablet Take 20 mEq by mouth every morning.    Yes [provider]  tiZANidine (ZANAFLEX) 4 MG tablet Take 4 mg by mouth every 8 (eight) hours as needed for muscle spasms.   Yes [provider]  triamcinolone cream (KENALOG) 0.1 % apply to affected area twice a day 05/24/15  Yes [provider]  predniSONE (DELTASONE) 20 MG tablet Take 1 tablet (20 mg total) by mouth as directed. Take 1 tab 3x daily for 4 days then 1 tab 2x daily for 4 days then 1 tab once daily for 4 days, then 1/2 tab daily for 4 days and  off. Patient not taking: Reported on 05/04/2017 04/12/17   Meredith Staggers, MD    Family History Family History  Problem Relation Age of Onset  . Diabetes Mother   . Heart failure Mother   . Breast cancer Sister   . CAD Father   . Hypertension Father   . Cancer Paternal Grandfather   . Hypertension Paternal Grandmother   . Stroke Paternal Grandmother   . Other Maternal Grandmother     tonsilitis  . Diabetes Maternal Grandfather   . Hypertension Maternal Grandfather   . Breast cancer Paternal Aunt     Social History Social History  Substance Use Topics  . Smoking status: Former Smoker    Types: Cigarettes    Quit date: 08/08/1996  . Smokeless tobacco: Never Used  . Alcohol use No     Allergies   Aspirin; Bactrim [sulfamethoxazole-trimethoprim]; Bee venom; Ceftin [cefuroxime axetil]; Ciprofloxacin; Milk-related compounds; and Latex   Review of Systems Review of Systems  Reason unable to perform ROS: urgent need for intervention.     Physical Exam Updated Vital Signs BP (!) 176/90   Pulse 80   Temp 98.3 F (36.8 C) (Oral)   Resp 16   Ht 5' (1.524 m)   Wt 286 lb (129.7 kg)   LMP 06/06/2013   SpO2 95%   BMI 55.86 kg/m   Physical Exam  Constitutional: She is oriented to person, place, and time.  Obese, no respiratory distress    HENT:  Head: Normocephalic and atraumatic.  Eyes: Conjunctivae are normal.  Neck: Neck supple.  Cardiovascular: Normal rate and regular rhythm.   Pulmonary/Chest: Effort normal and breath sounds normal.  Abdominal: Soft. Bowel sounds are normal.  Musculoskeletal: Normal range of motion.  Neurological: She is alert and oriented to person, place, and time.  Skin:  3 cm area of erythema and tenderness on left anterior knee  Psychiatric: She has a normal mood and affect. Her behavior is normal.  Nursing note and vitals reviewed.    ED Treatments / Results  Labs (all labs ordered are listed, but only abnormal results are displayed) Labs Reviewed - No data to display  EKG  EKG Interpretation None       Radiology No results found.  Procedures Procedures (including critical care time)  Medications Ordered in ED Medications  sodium chloride 0.9 % bolus 500 mL (0 mLs Intravenous Stopped 05/04/17 1413)  methylPREDNISolone sodium succinate (SOLU-MEDROL) 125 mg/2 mL injection 125 mg (125 mg Intravenous Given 05/04/17 1235)  diphenhydrAMINE (BENADRYL) injection 25 mg (25 mg Intravenous Given 05/04/17 1236)  famotidine (PEPCID) IVPB 20 mg premix (0 mg Intravenous Stopped 05/04/17 1306)     Initial Impression / Assessment and Plan / ED Course  I have reviewed the triage vital signs and the nursing notes.  Pertinent labs & imaging results that were available during my care of the patient were reviewed by me and considered in my medical decision making (see chart for details).     Patient responded well to IV steroid, IV Pepcid, IV Benadryl. Discharge medications epinephrine pen. Discussed findings with the patient.  Final Clinical Impressions(s) / ED Diagnoses   Final diagnoses:  Allergic reaction, initial encounter    New Prescriptions Discharge Medication List as of 05/04/2017  2:17 PM       Nat Christen, MD 05/04/17 1431

## 2017-05-04 NOTE — Discharge Instructions (Signed)
I have prescribed an EpiPen. Recommend having liquid Benadryl in your presence. Ice pack to your knee. Follow-up your primary care doctor.

## 2017-05-04 NOTE — ED Triage Notes (Signed)
Pt reports getting stung by a bee on her left knee about 45 minutes ago.  States it "feels like cotton" in her mouth, but denies difficulty swallowing or breathing.

## 2017-05-04 NOTE — ED Notes (Signed)
Pt states sting to L lateral knee, minimal erythema and pain noted. Denies throat swelling, CP, SOB, or difficulty swallowing.

## 2017-05-21 DIAGNOSIS — M159 Polyosteoarthritis, unspecified: Secondary | ICD-10-CM | POA: Diagnosis not present

## 2017-05-21 DIAGNOSIS — G4733 Obstructive sleep apnea (adult) (pediatric): Secondary | ICD-10-CM | POA: Diagnosis not present

## 2017-05-21 DIAGNOSIS — G4710 Hypersomnia, unspecified: Secondary | ICD-10-CM | POA: Diagnosis not present

## 2017-05-23 DIAGNOSIS — E782 Mixed hyperlipidemia: Secondary | ICD-10-CM | POA: Diagnosis not present

## 2017-05-23 DIAGNOSIS — I5032 Chronic diastolic (congestive) heart failure: Secondary | ICD-10-CM | POA: Diagnosis not present

## 2017-05-23 DIAGNOSIS — Z1389 Encounter for screening for other disorder: Secondary | ICD-10-CM | POA: Diagnosis not present

## 2017-05-23 DIAGNOSIS — I200 Unstable angina: Secondary | ICD-10-CM | POA: Diagnosis not present

## 2017-05-23 DIAGNOSIS — I10 Essential (primary) hypertension: Secondary | ICD-10-CM | POA: Diagnosis not present

## 2017-05-23 DIAGNOSIS — E1165 Type 2 diabetes mellitus with hyperglycemia: Secondary | ICD-10-CM | POA: Diagnosis not present

## 2017-05-23 DIAGNOSIS — Z6841 Body Mass Index (BMI) 40.0 and over, adult: Secondary | ICD-10-CM | POA: Diagnosis not present

## 2017-05-28 ENCOUNTER — Encounter (HOSPITAL_COMMUNITY): Payer: Self-pay | Admitting: *Deleted

## 2017-05-28 ENCOUNTER — Emergency Department (HOSPITAL_COMMUNITY)
Admission: EM | Admit: 2017-05-28 | Discharge: 2017-05-28 | Disposition: A | Discharge status: Home / Self Care | Payer: Medicare HMO | Source: Home / Self Care | Attending: Emergency Medicine | Admitting: Emergency Medicine

## 2017-05-28 ENCOUNTER — Emergency Department (HOSPITAL_COMMUNITY): Payer: Medicare HMO

## 2017-05-28 DIAGNOSIS — E785 Hyperlipidemia, unspecified: Secondary | ICD-10-CM | POA: Diagnosis present

## 2017-05-28 DIAGNOSIS — N3001 Acute cystitis with hematuria: Secondary | ICD-10-CM | POA: Diagnosis not present

## 2017-05-28 DIAGNOSIS — K5730 Diverticulosis of large intestine without perforation or abscess without bleeding: Secondary | ICD-10-CM | POA: Diagnosis not present

## 2017-05-28 DIAGNOSIS — R1030 Lower abdominal pain, unspecified: Secondary | ICD-10-CM | POA: Diagnosis not present

## 2017-05-28 DIAGNOSIS — Z87440 Personal history of urinary (tract) infections: Secondary | ICD-10-CM | POA: Diagnosis not present

## 2017-05-28 DIAGNOSIS — Z87891 Personal history of nicotine dependence: Secondary | ICD-10-CM

## 2017-05-28 DIAGNOSIS — N189 Chronic kidney disease, unspecified: Secondary | ICD-10-CM | POA: Diagnosis not present

## 2017-05-28 DIAGNOSIS — Z7984 Long term (current) use of oral hypoglycemic drugs: Secondary | ICD-10-CM | POA: Insufficient documentation

## 2017-05-28 DIAGNOSIS — R7989 Other specified abnormal findings of blood chemistry: Secondary | ICD-10-CM | POA: Diagnosis not present

## 2017-05-28 DIAGNOSIS — Z882 Allergy status to sulfonamides status: Secondary | ICD-10-CM | POA: Diagnosis not present

## 2017-05-28 DIAGNOSIS — E1122 Type 2 diabetes mellitus with diabetic chronic kidney disease: Secondary | ICD-10-CM | POA: Diagnosis present

## 2017-05-28 DIAGNOSIS — R0789 Other chest pain: Secondary | ICD-10-CM

## 2017-05-28 DIAGNOSIS — E872 Acidosis: Secondary | ICD-10-CM | POA: Diagnosis not present

## 2017-05-28 DIAGNOSIS — R079 Chest pain, unspecified: Secondary | ICD-10-CM

## 2017-05-28 DIAGNOSIS — G8929 Other chronic pain: Secondary | ICD-10-CM | POA: Diagnosis present

## 2017-05-28 DIAGNOSIS — R05 Cough: Secondary | ICD-10-CM | POA: Diagnosis not present

## 2017-05-28 DIAGNOSIS — J449 Chronic obstructive pulmonary disease, unspecified: Secondary | ICD-10-CM

## 2017-05-28 DIAGNOSIS — I272 Pulmonary hypertension, unspecified: Secondary | ICD-10-CM | POA: Diagnosis present

## 2017-05-28 DIAGNOSIS — Z9884 Bariatric surgery status: Secondary | ICD-10-CM | POA: Diagnosis not present

## 2017-05-28 DIAGNOSIS — N183 Chronic kidney disease, stage 3 (moderate): Secondary | ICD-10-CM | POA: Diagnosis present

## 2017-05-28 DIAGNOSIS — K589 Irritable bowel syndrome without diarrhea: Secondary | ICD-10-CM | POA: Diagnosis present

## 2017-05-28 DIAGNOSIS — J45909 Unspecified asthma, uncomplicated: Secondary | ICD-10-CM | POA: Insufficient documentation

## 2017-05-28 DIAGNOSIS — N10 Acute tubulo-interstitial nephritis: Secondary | ICD-10-CM | POA: Diagnosis not present

## 2017-05-28 DIAGNOSIS — I5032 Chronic diastolic (congestive) heart failure: Secondary | ICD-10-CM | POA: Diagnosis present

## 2017-05-28 DIAGNOSIS — N200 Calculus of kidney: Secondary | ICD-10-CM | POA: Diagnosis present

## 2017-05-28 DIAGNOSIS — N179 Acute kidney failure, unspecified: Secondary | ICD-10-CM | POA: Diagnosis present

## 2017-05-28 DIAGNOSIS — N390 Urinary tract infection, site not specified: Secondary | ICD-10-CM | POA: Diagnosis present

## 2017-05-28 DIAGNOSIS — G4733 Obstructive sleep apnea (adult) (pediatric): Secondary | ICD-10-CM | POA: Diagnosis present

## 2017-05-28 DIAGNOSIS — Z881 Allergy status to other antibiotic agents status: Secondary | ICD-10-CM | POA: Diagnosis not present

## 2017-05-28 DIAGNOSIS — E119 Type 2 diabetes mellitus without complications: Secondary | ICD-10-CM | POA: Diagnosis not present

## 2017-05-28 DIAGNOSIS — I110 Hypertensive heart disease with heart failure: Secondary | ICD-10-CM | POA: Diagnosis present

## 2017-05-28 DIAGNOSIS — N289 Disorder of kidney and ureter, unspecified: Secondary | ICD-10-CM | POA: Diagnosis not present

## 2017-05-28 DIAGNOSIS — A419 Sepsis, unspecified organism: Secondary | ICD-10-CM | POA: Diagnosis present

## 2017-05-28 DIAGNOSIS — R102 Pelvic and perineal pain: Secondary | ICD-10-CM | POA: Diagnosis not present

## 2017-05-28 DIAGNOSIS — K920 Hematemesis: Secondary | ICD-10-CM | POA: Diagnosis present

## 2017-05-28 DIAGNOSIS — Z886 Allergy status to analgesic agent status: Secondary | ICD-10-CM | POA: Diagnosis not present

## 2017-05-28 DIAGNOSIS — E869 Volume depletion, unspecified: Secondary | ICD-10-CM | POA: Diagnosis present

## 2017-05-28 DIAGNOSIS — Z6841 Body Mass Index (BMI) 40.0 and over, adult: Secondary | ICD-10-CM | POA: Diagnosis not present

## 2017-05-28 HISTORY — DX: Obesity, unspecified: E66.9

## 2017-05-28 LAB — URINALYSIS, ROUTINE W REFLEX MICROSCOPIC
Bilirubin Urine: NEGATIVE
Glucose, UA: NEGATIVE mg/dL
Ketones, ur: NEGATIVE mg/dL
Nitrite: NEGATIVE
Protein, ur: 100 mg/dL — AB
Specific Gravity, Urine: 1.015 (ref 1.005–1.030)
pH: 6 (ref 5.0–8.0)

## 2017-05-28 LAB — I-STAT TROPONIN, ED: Troponin i, poc: 0 ng/mL (ref 0.00–0.08)

## 2017-05-28 LAB — BASIC METABOLIC PANEL
Anion gap: 11 (ref 5–15)
BUN: 13 mg/dL (ref 6–20)
CO2: 24 mmol/L (ref 22–32)
Calcium: 9.7 mg/dL (ref 8.9–10.3)
Chloride: 103 mmol/L (ref 101–111)
Creatinine, Ser: 1.27 mg/dL — ABNORMAL HIGH (ref 0.44–1.00)
GFR calc Af Amer: 52 mL/min — ABNORMAL LOW (ref >60)
GFR calc non Af Amer: 45 mL/min — ABNORMAL LOW (ref >60)
Glucose, Bld: 222 mg/dL — ABNORMAL HIGH (ref 65–99)
Potassium: 3.8 mmol/L (ref 3.5–5.1)
Sodium: 138 mmol/L (ref 135–145)

## 2017-05-28 LAB — CBC
HCT: 38.4 % (ref 36.0–46.0)
Hemoglobin: 12.7 g/dL (ref 12.0–15.0)
MCH: 32.2 pg (ref 26.0–34.0)
MCHC: 33.1 g/dL (ref 30.0–36.0)
MCV: 97.5 fL (ref 78.0–100.0)
Platelets: 302 10*3/uL (ref 150–400)
RBC: 3.94 MIL/uL (ref 3.87–5.11)
RDW: 13.1 % (ref 11.5–15.5)
WBC: 9.8 10*3/uL (ref 4.0–10.5)

## 2017-05-28 LAB — POC OCCULT BLOOD, ED: Fecal Occult Bld: NEGATIVE

## 2017-05-28 MED ORDER — NITROFURANTOIN MONOHYD MACRO 100 MG PO CAPS: 100.0000 mg | ORAL_CAPSULE | Freq: Two times a day (BID) | ORAL | 0 refills | Status: DC

## 2017-05-28 MED ORDER — ONDANSETRON 4 MG PO TBDP
4.0000 mg | ORAL_TABLET | Freq: Once | ORAL | Status: AC
  Administered 2017-05-28: 4 mg via ORAL
  Filled 2017-05-28: qty 1

## 2017-05-28 NOTE — ED Triage Notes (Signed)
Pt is here for CP intermittent x1 week with sob, diaphoresis and generalized weakness.  Pt reports that CP and associated symptoms increase with activity.  Pt also reports tarry stools x2-3 weeks.  Pt reports increase in tarry stools with incontinence last night while she was sleeping.  Pt reports cardiac hx inclusing CHF and MI x 3.

## 2017-05-28 NOTE — Discharge Instructions (Signed)
Take antibiotics for UTI Follow-up with cardiology for continued chest pain. Number provided.

## 2017-05-28 NOTE — ED Provider Notes (Signed)
[facility] DEPT Provider Note   CSN: [id number] Arrival date & time: 05/28/17  1126     History   Chief Complaint Chief Complaint  Patient presents with  . Chest Pain    HPI Sara Tran is a 61 y.o. female.  HPI Patient presents to ED with multiple complaints. PMH significant for asthma, IBS, hypertriglyceridemia, s/p lap band surgery, T2DM, angina and OSA. Patient states she has been having bloody stools. Stool looks tarry black for the last 1.5 weeks. She has some associated abdominal pain that is generalized and sharp. Has known IBS. Once she has the pain she will then have diarrhea.  She has had increased nausea and vomiting around noxious stimuli. Denies blood in vomit. Mentions she has been having some central chest pain that is worse with exertion. Doesn't know whether it is her asthma vs heart. Has known angina and has been taking her nitro tabs with some relief. Has also been using her albuterol inhaler more. Denies any fevers.    Past Medical History:  Diagnosis Date  . Adnexal cyst    Right simple cyst seen on [place]  . Arthritis   . Asthma   . Chronic back pain   . Chronic hip pain   . COPD (chronic obstructive pulmonary disease) ([place])   . Herpes simplex virus (HSV) infection   . History of cardiac catheterization    Normal coronaries 2013  . Hot flashes   . Hypertriglyceridemia   . IBS (irritable bowel syndrome)   . Internal hemorrhoids   . Morbid obesity ([place])    s/p lap band surgery  . Obesity   . Pulmonary HTN ([place])    [place] 2008:  RA pressures 13/10 with a mean of 7 mmHg.  RV pressure 05/39 with end diastolic pressure of 15 mmHg.  PA pressure 49/23 with a mean of 35 mmHg.  Pulmonary capillary wedge 17/50 with a mean of 14 mmHg. The PA saturation was 68%.  RA saturation was 71% and aortic saturation was 90%.  Cardiac output was 6.0 with a cardiac index of 2.80 by Fick.  Normal coronaries by cath 2008.  Marland Kitchen Renal insufficiency   . Sciatica of right side   .  Sleep apnea    CPAP use  . Type 2 diabetes mellitus ([place])   . Uterine fibroid     Patient Active Problem List   Diagnosis Date Noted  . Cervicalgia 11/21/2016  . Chronic pain syndrome 10/11/2015  . Lumbar radiculopathy 07/25/2015  . Fibroid 10/13/2013  . Adnexal cyst 10/13/2013  . PMB (postmenopausal bleeding) 10/06/2013  . Unspecified symptom associated with female genital organs 10/06/2013  . Hot flashes 10/06/2013  . Internal hemorrhoids with other complication 76/73/4193  . 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 Hip pain 04/09/2013  . Hemorrhoids, internal 03/12/2013  . Back pain 03/11/2013  . Encounter for therapeutic drug monitoring 03/11/2013  . Trochanteric bursitis 03/11/2013  . Lumbar facet arthropathy ([place]) 03/11/2013  . Unstable angina ([place]) 06/02/2012  . Acute on chronic renal insufficiency 06/02/2012  . Chest pain 04/22/2012  . IBS (irritable bowel syndrome)-DIARRHEA 01/23/2012  . Morbid obesity ([place]) 08/09/2011  . Diabetes mellitus ([place]) 08/09/2011  . DIARRHEA 10/11/2010  . Chronic diastolic heart failure ([place]) 12/16/2007  . Pulmonary hypertension ([place]) 12/12/2007  . OSTEOARTHRITIS 12/11/2007  . SLEEP APNEA 12/11/2007    Past Surgical History:  Procedure Laterality Date  . CARDIAC CATHETERIZATION  07/2007, 05/2012   Normal coronary arteries  . COLONOSCOPY  10/18/10  SLF:2-mm sessile sigmoid polyp/diverticula  . HEMORRHOID BANDING  07/2012   Dr. Oneida Alar  . LAPAROSCOPIC CHOLECYSTECTOMY  1985  . LAPAROSCOPIC GASTRIC BANDING  12/12/2010  . LEFT AND RIGHT HEART CATHETERIZATION WITH CORONARY ANGIOGRAM N/A 06/03/2012   Procedure: LEFT AND RIGHT HEART CATHETERIZATION WITH CORONARY ANGIOGRAM;  Surgeon: Jolaine Artist, MD;  Location: [facility] CATH LAB;  Service: Cardiovascular;  Laterality: N/A;  . PILONIDAL CYST EXCISION  1974  . 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 TUMOR EXCISION  10/2011   Face    OB History    Gravida Para Term Preterm AB Living   0  0 0 0 0 0   SAB TAB Ectopic Multiple Live Births   0 0 0 0         Home Medications    Prior to Admission medications   Medication Sig Start Date End Date Taking? Authorizing Provider  albuterol (PROVENTIL HFA;VENTOLIN HFA) 108 (90 BASE) MCG/ACT inhaler Inhale 2 puffs into the lungs every 6 (six) hours as needed. FOR SHORTNESS OF BREATH/WHEEZING    [provider]  albuterol (PROVENTIL) (2.5 MG/3ML) 0.083% nebulizer solution Take 2.5 mg by nebulization every 4 (four) hours as needed for wheezing or shortness of breath.  09/19/15   [provider]  ALPRAZolam Duanne Moron) 0.5 MG tablet Take 0.5 mg by mouth 3 (three) times daily as needed. For anxiety/sleep 11/24/14   [provider]  amLODipine (NORVASC) 10 MG tablet Take 1 tablet by mouth every evening.  04/10/13   [provider]  carisoprodol (SOMA) 350 MG tablet TAKE 1 TABLET 4 TIMES A DAY AS NEEDED FOR MUSCLE SPASM. 02/13/17   Meredith Staggers, MD  Cholecalciferol (VITAMIN D3 PO) Take 1 capsule by mouth daily.    [provider]  fenofibrate 160 MG tablet Take 160 mg by mouth daily. 07/21/15   [provider]  fluticasone (FLONASE) 50 MCG/ACT nasal spray Place 1-2 sprays into the nose daily.     [provider]  gabapentin (NEURONTIN) 300 MG capsule Take 300 mg by mouth 3 (three) times daily.    [provider]  ipratropium (ATROVENT) 0.02 % nebulizer solution Take 0.5 mg by nebulization every 6 (six) hours as needed.  09/19/15   [provider]  isosorbide mononitrate (IMDUR) 30 MG 24 hr tablet Take 30 mg by mouth every morning.  09/05/11   [provider]  loratadine (CLARITIN) 10 MG tablet Take 10 mg by mouth every morning.     [provider]  losartan (COZAAR) 100 MG tablet Take 100 mg by mouth every morning.     [provider]  magnesium oxide (MAG-OX) 400 MG tablet Take 400 mg by mouth daily.    [provider]  metFORMIN  (GLUCOPHAGE) 500 MG tablet Take 1 tablet by mouth 2 (two) times daily. 02/13/13   [provider]  Multiple Vitamins-Minerals (ONE-A-DAY 50 PLUS PO) Take 1 tablet by mouth daily.    [provider]  nitroGLYCERIN (NITROSTAT) 0.4 MG SL tablet Place 0.4 mg under the tongue every 5 (five) minutes as needed. Reported on 06/14/2016    [provider]  potassium chloride SA (K-DUR,KLOR-CON) 20 MEQ tablet Take 20 mEq by mouth every morning.     [provider]  predniSONE (DELTASONE) 20 MG tablet Take 1 tablet (20 mg total) by mouth as directed. Take 1 tab 3x daily for 4 days then 1 tab 2x daily for 4 days then 1  tab once daily for 4 days, then 1/2 tab daily for 4 days and off. Patient not taking: Reported on 05/04/2017 04/12/17   Meredith Staggers, MD  tiZANidine (ZANAFLEX) 4 MG tablet Take 4 mg by mouth every 8 (eight) hours as needed for muscle spasms.    [provider]  triamcinolone cream (KENALOG) 0.1 % apply to affected area twice a day 05/24/15   [provider]    Family History Family History  Problem Relation Age of Onset  . Diabetes Mother   . Heart failure Mother   . Breast cancer Sister   . CAD Father   . Hypertension Father   . Cancer Paternal Grandfather   . Hypertension Paternal Grandmother   . Stroke Paternal Grandmother   . Other Maternal Grandmother        tonsilitis  . Diabetes Maternal Grandfather   . Hypertension Maternal Grandfather   . Breast cancer Paternal Aunt     Social History Social History  Substance Use Topics  . Smoking status: Former Smoker    Types: Cigarettes    Quit date: 08/08/1996  . Smokeless tobacco: Never Used  . Alcohol use No    Allergies   Aspirin; Bactrim [sulfamethoxazole-trimethoprim]; Bee venom; Ceftin [cefuroxime axetil]; Ciprofloxacin; Milk-related compounds; and Latex   Review of Systems Review of Systems All systems reviewed and are negative for acute change except as noted in the  HPI.   Physical Exam Updated Vital Signs BP (!) 152/85   Pulse (!) 107   Temp 98.2 F (36.8 C) (Oral)   Resp 18   Wt 129.7 kg (286 lb)   LMP 06/06/2013   SpO2 91%   BMI 55.86 kg/m   Physical Exam  Constitutional: She appears well-developed and well-nourished. No distress.  obese  HENT:  Head: Normocephalic and atraumatic.  Mouth/Throat: Oropharynx is clear and moist.  Eyes: Conjunctivae and EOM are normal.  Neck: Neck supple.  Cardiovascular: Regular rhythm.  Tachycardia present.   Pulmonary/Chest: Effort normal. She exhibits tenderness.  Abdominal: Soft. Normal appearance and bowel sounds are normal. There is generalized tenderness.  Port appreciated subdermal in lower abdomen  Musculoskeletal: Normal range of motion.  Neurological: She is alert.  Psychiatric: She has a normal mood and affect.  Nursing note and vitals reviewed.   ED Treatments / Results  Labs (all labs ordered are listed, but only abnormal results are displayed) Labs Reviewed  BASIC METABOLIC PANEL - Abnormal; Notable for the following:       Result Value   Glucose, Bld 222 (*)    Creatinine, Ser 1.27 (*)    GFR calc non Af Amer 45 (*)    GFR calc Af Amer 52 (*)    All other components within normal limits  URINALYSIS, ROUTINE W REFLEX MICROSCOPIC - Abnormal; Notable for the following:    Color, Urine AMBER (*)    APPearance TURBID (*)    Hgb urine dipstick LARGE (*)    Protein, ur 100 (*)    Leukocytes, UA LARGE (*)    Bacteria, UA MANY (*)    Squamous Epithelial / LPF 6-30 (*)    All other components within normal limits  CBC  I-STAT TROPOININ, ED  POC OCCULT BLOOD, ED    EKG  EKG Interpretation  Date/Time:  Tuesday May 28 2017 11:35:14 EDT Ventricular Rate:  114 PR Interval:    QRS Duration: 83 QT Interval:  311 QTC Calculation: 429 R Axis:   -35 Text Interpretation:  Sinus tachycardia Abnormal R-wave progression, early transition Left ventricular hypertrophy Nonspecific T  abnormalities, lateral leads Confirmed by Reather Converse MD, JOSHUA [phone]) on 05/28/2017 12:54:48 PM       Radiology Dg Chest 2 View  Result Date: 05/28/2017 CLINICAL DATA:  Chest pain for 2 weeks EXAM: CHEST  2 VIEW COMPARISON:  February 01, 2017 FINDINGS: Lungs are clear. Heart size and pulmonary vascularity are normal. No pneumothorax. No adenopathy. There is mild degenerative change in the thoracic spine. There is a lap band at the gastric cardia level. IMPRESSION: No edema or consolidation. Electronically Signed   By: Lowella Grip III M.D.   On: 05/28/2017 12:21    Procedures Procedures (including critical care time)  Medications Ordered in ED Medications  ondansetron (ZOFRAN-ODT) disintegrating tablet 4 mg (4 mg Oral Given 05/28/17 1234)    Initial Impression / Assessment and Plan / ED Course  I have reviewed the triage vital signs and the nursing notes.  Pertinent labs & imaging results that were available during my care of the patient were reviewed by me and considered in my medical decision making (see chart for details).  Well-appearing patient. Did chest pain work-up. CXR unremarkable. Negative troponin. EKG without signs of ACS. Patient with h/o unstable angina and follows with cardiology. Has missed most recent cardiology appointments. Most likely continues to have angina with exertion causing chest pain. Patient gets relief with nitroglycerin. Patient encouraged to follow-up with cardiology and to take prn nitro.   FOBT negative. No signs of blood on digital rectal exam. Patient has known IBS with diarrhea predominant. Symptoms of abdominal pain and bowel movement consistent with this. Patient to continue her IBS management.   UA with signs of UTI with hematuria. Rx given for nitrofurantoin to treat. Patient has allergies to other antibiotics. To follow-up with PCP for recheck of [place] in setting of hematuria.   Final Clinical Impressions(s) / ED Diagnoses   Final diagnoses:    Acute cystitis with hematuria  Chest pain, unspecified type  Lower abdominal pain    New Prescriptions New Prescriptions   No medications on file     Katheren Shams, DO 05/29/17 5427    Elnora Morrison, MD 05/30/17 [phone]

## 2017-05-29 ENCOUNTER — Encounter (HOSPITAL_COMMUNITY): Payer: Self-pay | Admitting: Emergency Medicine

## 2017-05-29 ENCOUNTER — Emergency Department (HOSPITAL_COMMUNITY): Payer: Medicare HMO

## 2017-05-29 ENCOUNTER — Inpatient Hospital Stay (HOSPITAL_COMMUNITY)
Admission: EM | Admit: 2017-05-29 | Discharge: 2017-06-01 | DRG: 872 | Disposition: A | Discharge status: Home / Self Care | Payer: Medicare HMO | Attending: Family Medicine | Admitting: Family Medicine

## 2017-05-29 DIAGNOSIS — Z886 Allergy status to analgesic agent status: Secondary | ICD-10-CM

## 2017-05-29 DIAGNOSIS — N10 Acute tubulo-interstitial nephritis: Secondary | ICD-10-CM

## 2017-05-29 DIAGNOSIS — J449 Chronic obstructive pulmonary disease, unspecified: Secondary | ICD-10-CM | POA: Diagnosis present

## 2017-05-29 DIAGNOSIS — K589 Irritable bowel syndrome without diarrhea: Secondary | ICD-10-CM | POA: Diagnosis present

## 2017-05-29 DIAGNOSIS — Z87440 Personal history of urinary (tract) infections: Secondary | ICD-10-CM

## 2017-05-29 DIAGNOSIS — N390 Urinary tract infection, site not specified: Secondary | ICD-10-CM | POA: Diagnosis present

## 2017-05-29 DIAGNOSIS — Z91040 Latex allergy status: Secondary | ICD-10-CM

## 2017-05-29 DIAGNOSIS — E785 Hyperlipidemia, unspecified: Secondary | ICD-10-CM | POA: Diagnosis present

## 2017-05-29 DIAGNOSIS — Z79899 Other long term (current) drug therapy: Secondary | ICD-10-CM

## 2017-05-29 DIAGNOSIS — Z6841 Body Mass Index (BMI) 40.0 and over, adult: Secondary | ICD-10-CM | POA: Diagnosis not present

## 2017-05-29 DIAGNOSIS — N189 Chronic kidney disease, unspecified: Secondary | ICD-10-CM

## 2017-05-29 DIAGNOSIS — E869 Volume depletion, unspecified: Secondary | ICD-10-CM | POA: Diagnosis present

## 2017-05-29 DIAGNOSIS — K920 Hematemesis: Secondary | ICD-10-CM | POA: Diagnosis present

## 2017-05-29 DIAGNOSIS — E1122 Type 2 diabetes mellitus with diabetic chronic kidney disease: Secondary | ICD-10-CM | POA: Diagnosis present

## 2017-05-29 DIAGNOSIS — Z9884 Bariatric surgery status: Secondary | ICD-10-CM

## 2017-05-29 DIAGNOSIS — Z87891 Personal history of nicotine dependence: Secondary | ICD-10-CM | POA: Diagnosis not present

## 2017-05-29 DIAGNOSIS — N183 Chronic kidney disease, stage 3 (moderate): Secondary | ICD-10-CM | POA: Diagnosis present

## 2017-05-29 DIAGNOSIS — E119 Type 2 diabetes mellitus without complications: Secondary | ICD-10-CM | POA: Diagnosis not present

## 2017-05-29 DIAGNOSIS — G4733 Obstructive sleep apnea (adult) (pediatric): Secondary | ICD-10-CM | POA: Diagnosis present

## 2017-05-29 DIAGNOSIS — I110 Hypertensive heart disease with heart failure: Secondary | ICD-10-CM | POA: Diagnosis present

## 2017-05-29 DIAGNOSIS — Z881 Allergy status to other antibiotic agents status: Secondary | ICD-10-CM | POA: Diagnosis not present

## 2017-05-29 DIAGNOSIS — G8929 Other chronic pain: Secondary | ICD-10-CM | POA: Diagnosis present

## 2017-05-29 DIAGNOSIS — I272 Pulmonary hypertension, unspecified: Secondary | ICD-10-CM | POA: Diagnosis present

## 2017-05-29 DIAGNOSIS — A419 Sepsis, unspecified organism: Principal | ICD-10-CM | POA: Diagnosis present

## 2017-05-29 DIAGNOSIS — N289 Disorder of kidney and ureter, unspecified: Secondary | ICD-10-CM

## 2017-05-29 DIAGNOSIS — R102 Pelvic and perineal pain: Secondary | ICD-10-CM

## 2017-05-29 DIAGNOSIS — N200 Calculus of kidney: Secondary | ICD-10-CM | POA: Diagnosis present

## 2017-05-29 DIAGNOSIS — Z882 Allergy status to sulfonamides status: Secondary | ICD-10-CM | POA: Diagnosis not present

## 2017-05-29 DIAGNOSIS — N179 Acute kidney failure, unspecified: Secondary | ICD-10-CM | POA: Diagnosis present

## 2017-05-29 DIAGNOSIS — Z7984 Long term (current) use of oral hypoglycemic drugs: Secondary | ICD-10-CM

## 2017-05-29 DIAGNOSIS — I5032 Chronic diastolic (congestive) heart failure: Secondary | ICD-10-CM | POA: Diagnosis present

## 2017-05-29 DIAGNOSIS — K922 Gastrointestinal hemorrhage, unspecified: Secondary | ICD-10-CM | POA: Diagnosis present

## 2017-05-29 DIAGNOSIS — R7989 Other specified abnormal findings of blood chemistry: Secondary | ICD-10-CM

## 2017-05-29 DIAGNOSIS — Z91030 Bee allergy status: Secondary | ICD-10-CM

## 2017-05-29 DIAGNOSIS — N3001 Acute cystitis with hematuria: Secondary | ICD-10-CM | POA: Diagnosis not present

## 2017-05-29 DIAGNOSIS — Z7951 Long term (current) use of inhaled steroids: Secondary | ICD-10-CM

## 2017-05-29 LAB — URINALYSIS, ROUTINE W REFLEX MICROSCOPIC
Bilirubin Urine: NEGATIVE
Glucose, UA: NEGATIVE mg/dL
Ketones, ur: NEGATIVE mg/dL
Nitrite: NEGATIVE
Protein, ur: 30 mg/dL — AB
Specific Gravity, Urine: 1.014 (ref 1.005–1.030)
pH: 5 (ref 5.0–8.0)

## 2017-05-29 LAB — COMPREHENSIVE METABOLIC PANEL
ALT: 25 U/L (ref 14–54)
AST: 26 U/L (ref 15–41)
Albumin: 3.9 g/dL (ref 3.5–5.0)
Alkaline Phosphatase: 44 U/L (ref 38–126)
Anion gap: 14 (ref 5–15)
BUN: 16 mg/dL (ref 6–20)
CO2: 24 mmol/L (ref 22–32)
Calcium: 10.2 mg/dL (ref 8.9–10.3)
Chloride: 102 mmol/L (ref 101–111)
Creatinine, Ser: 1.92 mg/dL — ABNORMAL HIGH (ref 0.44–1.00)
GFR calc Af Amer: 32 mL/min — ABNORMAL LOW (ref >60)
GFR calc non Af Amer: 27 mL/min — ABNORMAL LOW (ref >60)
Glucose, Bld: 210 mg/dL — ABNORMAL HIGH (ref 65–99)
Potassium: 4.3 mmol/L (ref 3.5–5.1)
Sodium: 140 mmol/L (ref 135–145)
Total Bilirubin: 0.7 mg/dL (ref 0.3–1.2)
Total Protein: 7.5 g/dL (ref 6.5–8.1)

## 2017-05-29 LAB — DIFFERENTIAL

## 2017-05-29 LAB — GLUCOSE, CAPILLARY: Glucose-Capillary: 202 mg/dL — ABNORMAL HIGH (ref 65–99)

## 2017-05-29 LAB — CBC
HCT: 36.8 % (ref 36.0–46.0)
Hemoglobin: 11.9 g/dL — ABNORMAL LOW (ref 12.0–15.0)
MCH: 31.8 pg (ref 26.0–34.0)
MCHC: 32.3 g/dL (ref 30.0–36.0)
MCV: 98.4 fL (ref 78.0–100.0)
Platelets: 299 10*3/uL (ref 150–400)
RBC: 3.74 MIL/uL — ABNORMAL LOW (ref 3.87–5.11)
RDW: 13.5 % (ref 11.5–15.5)
WBC: 11.2 10*3/uL — ABNORMAL HIGH (ref 4.0–10.5)

## 2017-05-29 LAB — LACTIC ACID, PLASMA
Lactic Acid, Venous: 3.8 mmol/L (ref 0.5–1.9)
Lactic Acid, Venous: 2.3 mmol/L (ref 0.5–1.9)

## 2017-05-29 LAB — POC OCCULT BLOOD, ED: Fecal Occult Bld: NEGATIVE

## 2017-05-29 LAB — I-STAT CG4 LACTIC ACID, ED: Lactic Acid, Venous: 4.3 mmol/L (ref 0.5–1.9)

## 2017-05-29 MED ORDER — ONDANSETRON HCL 4 MG PO TABS: 4.0000 mg | ORAL_TABLET | Freq: Four times a day (QID) | ORAL | Status: DC | PRN

## 2017-05-29 MED ORDER — ISOSORBIDE MONONITRATE ER 60 MG PO TB24
30.0000 mg | ORAL_TABLET | Freq: Every day | ORAL | Status: DC
  Administered 2017-05-30 – 2017-06-01 (×3): 30 mg via ORAL
  Filled 2017-05-29 (×3): qty 1

## 2017-05-29 MED ORDER — HEPARIN SODIUM (PORCINE) 5000 UNIT/ML IJ SOLN
7500.0000 [IU] | Freq: Three times a day (TID) | INTRAMUSCULAR | Status: DC
  Administered 2017-05-30 – 2017-06-01 (×8): 7500 [IU] via SUBCUTANEOUS
  Filled 2017-05-29 (×9): qty 2

## 2017-05-29 MED ORDER — PIPERACILLIN-TAZOBACTAM 3.375 G IVPB
3.3750 g | Freq: Once | INTRAVENOUS | Status: AC
  Administered 2017-05-30: 3.375 g via INTRAVENOUS
  Filled 2017-05-29: qty 50

## 2017-05-29 MED ORDER — SODIUM CHLORIDE 0.9 % IV BOLUS (SEPSIS)
1000.0000 mL | Freq: Once | INTRAVENOUS | Status: AC
  Administered 2017-05-29: 1000 mL via INTRAVENOUS

## 2017-05-29 MED ORDER — SODIUM CHLORIDE 0.9 % IV SOLN
INTRAVENOUS | Status: AC
  Administered 2017-05-29: via INTRAVENOUS

## 2017-05-29 MED ORDER — PANTOPRAZOLE SODIUM 40 MG IV SOLR
40.0000 mg | Freq: Two times a day (BID) | INTRAVENOUS | Status: DC
  Administered 2017-05-29 – 2017-06-01 (×6): 40 mg via INTRAVENOUS
  Filled 2017-05-29 (×6): qty 40

## 2017-05-29 MED ORDER — GABAPENTIN 300 MG PO CAPS
300.0000 mg | ORAL_CAPSULE | Freq: Three times a day (TID) | ORAL | Status: DC
  Administered 2017-05-29 – 2017-06-01 (×9): 300 mg via ORAL
  Filled 2017-05-29 (×9): qty 1

## 2017-05-29 MED ORDER — IPRATROPIUM BROMIDE 0.02 % IN SOLN: 0.5000 mg | RESPIRATORY_TRACT | Status: DC | PRN

## 2017-05-29 MED ORDER — ALPRAZOLAM 0.5 MG PO TABS
0.5000 mg | ORAL_TABLET | Freq: Three times a day (TID) | ORAL | Status: DC | PRN
  Administered 2017-05-31: 0.5 mg via ORAL
  Filled 2017-05-29: qty 1

## 2017-05-29 MED ORDER — PIPERACILLIN-TAZOBACTAM 3.375 G IVPB 30 MIN
3.3750 g | Freq: Once | INTRAVENOUS | Status: DC
  Filled 2017-05-29: qty 50

## 2017-05-29 MED ORDER — INSULIN ASPART 100 UNIT/ML ~~LOC~~ SOLN
0.0000 [IU] | SUBCUTANEOUS | Status: DC
  Administered 2017-05-29: 5 [IU] via SUBCUTANEOUS
  Administered 2017-05-30 (×3): 3 [IU] via SUBCUTANEOUS
  Administered 2017-05-30: 2 [IU] via SUBCUTANEOUS
  Administered 2017-05-30 – 2017-05-31 (×3): 3 [IU] via SUBCUTANEOUS
  Administered 2017-05-31: 2 [IU] via SUBCUTANEOUS
  Administered 2017-05-31 (×2): 3 [IU] via SUBCUTANEOUS
  Administered 2017-06-01: 2 [IU] via SUBCUTANEOUS
  Administered 2017-06-01 (×2): 3 [IU] via SUBCUTANEOUS
  Administered 2017-06-01: 5 [IU] via SUBCUTANEOUS
  Administered 2017-06-01: 2 [IU] via SUBCUTANEOUS

## 2017-05-29 MED ORDER — ACETAMINOPHEN 500 MG PO TABS
1000.0000 mg | ORAL_TABLET | Freq: Once | ORAL | Status: AC
  Administered 2017-05-29: 1000 mg via ORAL
  Filled 2017-05-29: qty 2

## 2017-05-29 MED ORDER — ONDANSETRON HCL 4 MG/2ML IJ SOLN: 4.0000 mg | Freq: Four times a day (QID) | INTRAMUSCULAR | Status: DC | PRN

## 2017-05-29 MED ORDER — ACETAMINOPHEN 650 MG RE SUPP: 650.0000 mg | Freq: Four times a day (QID) | RECTAL | Status: DC | PRN

## 2017-05-29 MED ORDER — SODIUM CHLORIDE 0.9 % IV BOLUS (SEPSIS)
500.0000 mL | Freq: Once | INTRAVENOUS | Status: AC
  Administered 2017-05-29: 500 mL via INTRAVENOUS

## 2017-05-29 MED ORDER — ACETAMINOPHEN 325 MG PO TABS
650.0000 mg | ORAL_TABLET | Freq: Four times a day (QID) | ORAL | Status: DC | PRN
  Filled 2017-05-29: qty 2

## 2017-05-29 MED ORDER — IPRATROPIUM-ALBUTEROL 0.5-2.5 (3) MG/3ML IN SOLN
2.5000 mg | RESPIRATORY_TRACT | Status: DC | PRN
  Administered 2017-05-30 – 2017-06-01 (×3): 2.5 mg via RESPIRATORY_TRACT
  Filled 2017-05-29 (×3): qty 3

## 2017-05-29 MED ORDER — AMLODIPINE BESYLATE 5 MG PO TABS
10.0000 mg | ORAL_TABLET | Freq: Every evening | ORAL | Status: DC
  Administered 2017-05-30 – 2017-05-31 (×2): 10 mg via ORAL
  Filled 2017-05-29 (×2): qty 2

## 2017-05-29 MED ORDER — PIPERACILLIN-TAZOBACTAM 3.375 G IVPB 30 MIN
3.3750 g | Freq: Once | INTRAVENOUS | Status: AC
  Administered 2017-05-29: 3.375 g via INTRAVENOUS

## 2017-05-29 MED ORDER — VITAMIN D 1000 UNITS PO TABS
1000.0000 [IU] | ORAL_TABLET | Freq: Every day | ORAL | Status: DC
  Administered 2017-05-30 – 2017-06-01 (×3): 1000 [IU] via ORAL
  Filled 2017-05-29 (×3): qty 1

## 2017-05-29 NOTE — Progress Notes (Signed)
ANTIBIOTIC CONSULT NOTE-Preliminary  Pharmacy Consult for Zosyn Indication: UTI/Sepsis  Allergies  Allergen Reactions  . Aspirin Other (See Comments)    Rectal bleeding  . Bactrim [Sulfamethoxazole-Trimethoprim] Other (See Comments)    Cramping,bloating,vomiting,fever,headaches  . Bee Venom Swelling    As a child  . Ceftin [Cefuroxime Axetil] Swelling    Swollen tongue, "made me crazy"  . Ciprofloxacin Cough    Coughed up blood  . Milk-Related Compounds Other (See Comments)    IBS  . Latex Itching and Rash    Patient Measurements: Height: 4\' 10"  (147.3 cm) Weight: 290 lb 11.2 oz (131.9 kg) IBW/kg (Calculated) : 40.9  Vital Signs: Temp: 98.7 F (37.1 C) (05/30 2320) Temp Source: Oral (05/30 2320) BP: 109/55 (05/30 2320) Pulse Rate: 102 (05/30 2320)  Labs:  Recent Labs  05/28/17 1137 05/29/17 1842  WBC 9.8 11.2*  HGB 12.7 11.9*  PLT 302 299  CREATININE 1.27* 1.92*    Estimated Creatinine Clearance: 38.5 mL/min (A) (by C-G formula based on SCr of 1.92 mg/dL (H)).  No results for input(s): VANCOTROUGH, VANCOPEAK, VANCORANDOM, GENTTROUGH, GENTPEAK, GENTRANDOM, TOBRATROUGH, TOBRAPEAK, TOBRARND, AMIKACINPEAK, AMIKACINTROU, AMIKACIN in the last 72 hours.   Microbiology: Recent Results (from the past 720 hour(s))  Blood culture (routine x 2)     Status: None (Preliminary result)   Collection Time: 05/29/17  8:10 PM  Result Value Ref Range Status   Specimen Description BLOOD LEFT ARM  Final   Special Requests   Final    BOTTLES DRAWN AEROBIC AND ANAEROBIC Blood Culture adequate volume   Culture PENDING  Incomplete   Report Status PENDING  Incomplete  Blood culture (routine x 2)     Status: None (Preliminary result)   Collection Time: 05/29/17  8:17 PM  Result Value Ref Range Status   Specimen Description BLOOD RIGHT ARM  Final   Special Requests   Final    BOTTLES DRAWN AEROBIC AND ANAEROBIC Blood Culture adequate volume   Culture PENDING  Incomplete   Report  Status PENDING  Incomplete    Medical History: Past Medical History:  Diagnosis Date  . Adnexal cyst    Right simple cyst seen on [place]  . Arthritis   . Asthma   . Chronic back pain   . Chronic hip pain   . COPD (chronic obstructive pulmonary disease) ([place])   . Herpes simplex virus (HSV) infection   . History of cardiac catheterization    Normal coronaries 2013  . Hot flashes   . Hypertriglyceridemia   . IBS (irritable bowel syndrome)   . Internal hemorrhoids   . Morbid obesity ([place])    s/p lap band surgery  . Obesity   . Pulmonary HTN ([place])    [place] 2008:  RA pressures 13/10 with a mean of 7 mmHg.  RV pressure 40/10 with end diastolic pressure of 15 mmHg.  PA pressure 49/23 with a mean of 35 mmHg.  Pulmonary capillary wedge 17/50 with a mean of 14 mmHg. The PA saturation was 68%.  RA saturation was 71% and aortic saturation was 90%.  Cardiac output was 6.0 with a cardiac index of 2.80 by Fick.  Normal coronaries by cath 2008.  Marland Kitchen Renal insufficiency   . Sciatica of right side   . Sleep apnea    CPAP use  . Type 2 diabetes mellitus ([place])   . Uterine fibroid     Medications:  Zosyn 3.375 Gm IV x 1 dose in the ED   Assessment: 61 yo  female seen in the ED with blood tinged vomitus and suprapubic tenderness; received OP treatment for UTI. WBCs are elevated as is lactic acid. Zosyn to be given for possible sepsis due to UTI.  Goal of Therapy:  Eradicate infection  Plan:  Preliminary review of pertinent patient information completed.  Protocol will be initiated with a one-time dose of Zosyn 3.375 Gm IV @ 8 hours after the initial dose.[place] clinical pharmacist will complete review during morning rounds to assess patient and finalize treatment regimen if needed.  Norberto Sorenson, [facility] 05/29/2017,11:33 PM

## 2017-05-29 NOTE — ED Notes (Signed)
CRITICAL VALUE ALERT  Critical Value:  Lactic Acid 3.8  Date & Time Notied:  05/29/17 1947  Provider Notified: Oleta Mouse MD  Orders Received/Actions taken: none

## 2017-05-29 NOTE — ED Notes (Signed)
EKG given to Dr. Oleta Mouse and notified her of symptoms and vital signs.  Pt placed on 2L 02 [place] for 02 sat 89%.

## 2017-05-29 NOTE — ED Provider Notes (Signed)
[facility] DEPT Provider Note   CSN: [id number] Arrival date & time: 05/29/17  8563     History   Chief Complaint Chief Complaint  Patient presents with  . Hematemesis    HPI Sara Tran is a 61 y.o. female.  The history is provided by the patient.   61 year old female who presents with multiple complaints. Was seen in the ED yesterday with reassuring work-up of dark black stools. States she has had black tarry stools for 1-2 weeks now. Today after eating blueberries vomited once and noticed streaks of blood in her vomit. Has been having low abdominal pain for "a while" now. Has also been feeling tired, fatigued, and mildly short of breath with nonproductive cough. No dysuria or frequency but has noted urgency of urine and hesitancy of urine. No hematuria. Chronic back pain she reports.   History of IBS, laproscopic gastric banding, COPD, DM and pulmonary HTN.   Past Medical History:  Diagnosis Date  . Adnexal cyst    Right simple cyst seen on [place]  . Arthritis   . Asthma   . Chronic back pain   . Chronic hip pain   . COPD (chronic obstructive pulmonary disease) ([place])   . Herpes simplex virus (HSV) infection   . History of cardiac catheterization    Normal coronaries 2013  . Hot flashes   . Hypertriglyceridemia   . IBS (irritable bowel syndrome)   . Internal hemorrhoids   . Morbid obesity ([place])    s/p lap band surgery  . Obesity   . Pulmonary HTN ([place])    [place] 2008:  RA pressures 13/10 with a mean of 7 mmHg.  RV pressure 14/97 with end diastolic pressure of 15 mmHg.  PA pressure 49/23 with a mean of 35 mmHg.  Pulmonary capillary wedge 17/50 with a mean of 14 mmHg. The PA saturation was 68%.  RA saturation was 71% and aortic saturation was 90%.  Cardiac output was 6.0 with a cardiac index of 2.80 by Fick.  Normal coronaries by cath 2008.  Marland Kitchen Renal insufficiency   . Sciatica of right side   . Sleep apnea    CPAP use  . Type 2 diabetes mellitus ([place])   . Uterine  fibroid     Patient Active Problem List   Diagnosis Date Noted  . Cervicalgia 11/21/2016  . Chronic pain syndrome 10/11/2015  . Lumbar radiculopathy 07/25/2015  . Fibroid 10/13/2013  . Adnexal cyst 10/13/2013  . PMB (postmenopausal bleeding) 10/06/2013  . Unspecified symptom associated with female genital organs 10/06/2013  . Hot flashes 10/06/2013  . Internal hemorrhoids with other complication 02/63/7858  . 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 Hip pain 04/09/2013  . Hemorrhoids, internal 03/12/2013  . Back pain 03/11/2013  . Encounter for therapeutic drug monitoring 03/11/2013  . Trochanteric bursitis 03/11/2013  . Lumbar facet arthropathy ([place]) 03/11/2013  . Unstable angina ([place]) 06/02/2012  . Acute on chronic renal insufficiency 06/02/2012  . Chest pain 04/22/2012  . IBS (irritable bowel syndrome)-DIARRHEA 01/23/2012  . Morbid obesity ([place]) 08/09/2011  . Diabetes mellitus ([place]) 08/09/2011  . DIARRHEA 10/11/2010  . Chronic diastolic heart failure ([place]) 12/16/2007  . Pulmonary hypertension ([place]) 12/12/2007  . OSTEOARTHRITIS 12/11/2007  . SLEEP APNEA 12/11/2007    Past Surgical History:  Procedure Laterality Date  . CARDIAC CATHETERIZATION  07/2007, 05/2012   Normal coronary arteries  . COLONOSCOPY  10/18/10   SLF:2-mm sessile sigmoid polyp/diverticula  . HEMORRHOID BANDING  07/2012  Dr. Oneida Alar  . LAPAROSCOPIC CHOLECYSTECTOMY  1985  . LAPAROSCOPIC GASTRIC BANDING  12/12/2010  . LEFT AND RIGHT HEART CATHETERIZATION WITH CORONARY ANGIOGRAM N/A 06/03/2012   Procedure: LEFT AND RIGHT HEART CATHETERIZATION WITH CORONARY ANGIOGRAM;  Surgeon: Jolaine Artist, MD;  Location: [facility] CATH LAB;  Service: Cardiovascular;  Laterality: N/A;  . PILONIDAL CYST EXCISION  1974  . 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 TUMOR EXCISION  10/2011   Face    OB History    Gravida Para Term Preterm AB Living   0 0 0 0 0 0   SAB TAB Ectopic Multiple Live Births   0 0 0 0          Home Medications    Prior to Admission medications   Medication Sig Start Date End Date Taking? Authorizing Provider  albuterol (PROVENTIL HFA;VENTOLIN HFA) 108 (90 BASE) MCG/ACT inhaler Inhale 2 puffs into the lungs every 6 (six) hours as needed. FOR SHORTNESS OF BREATH/WHEEZING   Yes [provider]  albuterol (PROVENTIL) (2.5 MG/3ML) 0.083% nebulizer solution Take 2.5 mg by nebulization every 4 (four) hours as needed for wheezing or shortness of breath.  09/19/15  Yes [provider]  ALPRAZolam Duanne Moron) 0.5 MG tablet Take 0.5 mg by mouth 3 (three) times daily as needed. For anxiety/sleep 11/24/14  Yes [provider]  amLODipine (NORVASC) 10 MG tablet Take 1 tablet by mouth every evening.  04/10/13  Yes [provider]  carisoprodol (SOMA) 350 MG tablet TAKE 1 TABLET 4 TIMES A DAY AS NEEDED FOR MUSCLE SPASM. 02/13/17  Yes Meredith Staggers, MD  Cholecalciferol (VITAMIN D3 PO) Take 1 capsule by mouth daily.   Yes [provider]  fenofibrate 160 MG tablet Take 160 mg by mouth daily. 07/21/15  Yes [provider]  fluticasone (FLONASE) 50 MCG/ACT nasal spray Place 1-2 sprays into the nose daily.    Yes [provider]  gabapentin (NEURONTIN) 300 MG capsule Take 300 mg by mouth 3 (three) times daily.   Yes [provider]  ipratropium (ATROVENT) 0.02 % nebulizer solution Take 0.5 mg by nebulization every 6 (six) hours as needed.  09/19/15  Yes [provider]  isosorbide mononitrate (IMDUR) 30 MG 24 hr tablet Take 30 mg by mouth every morning.  09/05/11  Yes [provider]  loratadine (CLARITIN) 10 MG tablet Take 1 tablet by mouth daily. 05/27/17  Yes [provider]  losartan (COZAAR) 100 MG tablet Take 100 mg by mouth every morning.    Yes [provider]  magnesium oxide (MAG-OX) 400 MG tablet Take 400 mg by mouth daily.   Yes [provider]  metFORMIN (GLUCOPHAGE-XR) 500 MG 24  hr tablet Take 1 tablet by mouth daily. 05/20/17  Yes [provider]  Multiple Vitamins-Minerals (ONE-A-DAY 50 PLUS PO) Take 1 tablet by mouth daily.   Yes [provider]  nitrofurantoin, macrocrystal-monohydrate, (MACROBID) 100 MG capsule Take 1 capsule (100 mg total) by mouth 2 (two) times daily. 05/28/17 06/02/17 Yes Phelps, Aviva Signs Y, DO  nitroGLYCERIN (NITROSTAT) 0.4 MG SL tablet Place 0.4 mg under the tongue every 5 (five) minutes as needed. Reported on 06/14/2016   Yes [provider]  potassium chloride SA (K-DUR,KLOR-CON) 20 MEQ tablet Take 20 mEq by mouth every morning.    Yes [provider]  tiZANidine (ZANAFLEX) 4 MG tablet Take 4 mg by mouth every 8 (eight) hours as needed for muscle spasms.   Yes  [provider]  predniSONE (DELTASONE) 20 MG tablet Take 1 tablet (20 mg total) by mouth as directed. Take 1 tab 3x daily for 4 days then 1 tab 2x daily for 4 days then 1 tab once daily for 4 days, then 1/2 tab daily for 4 days and off. Patient not taking: Reported on 05/04/2017 04/12/17   Meredith Staggers, MD    Family History Family History  Problem Relation Age of Onset  . Diabetes Mother   . Heart failure Mother   . Breast cancer Sister   . CAD Father   . Hypertension Father   . Cancer Paternal Grandfather   . Hypertension Paternal Grandmother   . Stroke Paternal Grandmother   . Other Maternal Grandmother        tonsilitis  . Diabetes Maternal Grandfather   . Hypertension Maternal Grandfather   . Breast cancer Paternal Aunt     Social History Social History  Substance Use Topics  . Smoking status: Former Smoker    Types: Cigarettes    Quit date: 08/08/1996  . Smokeless tobacco: Never Used  . Alcohol use No     Allergies   Aspirin; Bactrim [sulfamethoxazole-trimethoprim]; Bee venom; Ceftin [cefuroxime axetil]; Ciprofloxacin; Milk-related compounds; and Latex   Review of Systems Review of Systems  Constitutional: Negative for  fever.  HENT: Positive for congestion.   Respiratory: Positive for cough and shortness of breath.   Gastrointestinal: Positive for abdominal pain, diarrhea and vomiting.  Genitourinary: Positive for urgency. Negative for dysuria and frequency.  Musculoskeletal: Positive for back pain.  Hematological: Does not bruise/bleed easily.  All other systems reviewed and are negative.    Physical Exam Updated Vital Signs BP (!) 129/56   Pulse (!) 104   Temp 99.5 F (37.5 C) (Oral)   Resp 16   Ht 4\' 10"  (1.473 m)   Wt 129.7 kg (286 lb)   LMP 06/06/2013   SpO2 99%   BMI 59.77 kg/m   Physical Exam Physical Exam  Nursing note and vitals reviewed. Constitutional:  non-toxic, and in no acute distress Head: Normocephalic and atraumatic.  Mouth/Throat: Oropharynx is clear and dry mucous membranes.  Neck: Normal range of motion. Neck supple.  Cardiovascular: Tachycardic rate and regular rhythm.   Pulmonary/Chest: Effort normal and breath sounds normal.  Abdominal: Soft. There is minimal low abdominal tenderness. There is no rebound and no guarding. dark brown stool on rectal exam Musculoskeletal: Normal range of motion.  Neurological: Alert, no facial droop, fluent speech, moves all extremities symmetrically Skin: Skin is warm and dry.  Psychiatric: Cooperative   ED Treatments / Results  Labs (all labs ordered are listed, but only abnormal results are displayed) Labs Reviewed  COMPREHENSIVE METABOLIC PANEL - Abnormal; Notable for the following:       Result Value   Glucose, Bld 210 (*)    Creatinine, Ser 1.92 (*)    GFR calc non Af Amer 27 (*)    GFR calc Af Amer 32 (*)    All other components within normal limits  CBC - Abnormal; Notable for the following:    WBC 11.2 (*)    RBC 3.74 (*)    Hemoglobin 11.9 (*)    All other components within normal limits  LACTIC ACID, PLASMA - Abnormal; Notable for the following:    Lactic Acid, Venous 3.8 (*)    All other components within  normal limits  I-STAT CG4 LACTIC ACID, ED - Abnormal; Notable for the following:  Lactic Acid, Venous 4.30 (*)    All other components within normal limits  URINE CULTURE  CULTURE, BLOOD (ROUTINE X 2)  CULTURE, BLOOD (ROUTINE X 2)  DIFFERENTIAL  URINALYSIS, ROUTINE W REFLEX MICROSCOPIC  POC OCCULT BLOOD, ED  POCT GASTRIC OCCULT BLOOD (1-CARD TO LAB)  I-STAT CG4 LACTIC ACID, ED  TYPE AND SCREEN    EKG  EKG Interpretation  Date/Time:  Wednesday May 29 2017 18:46:30 EDT Ventricular Rate:  119 PR Interval:    QRS Duration: 77 QT Interval:  298 QTC Calculation: 420 R Axis:   -24 Text Interpretation:  Sinus tachycardia Borderline left axis deviation Abnormal R-wave progression, early transition EKG similar to prior  Confirmed by Brantley Stage [phone]) on 05/29/2017 6:55:47 PM       Radiology Dg Chest 2 View  Result Date: 05/29/2017 CLINICAL DATA:  Fatigue.  SOB.  Cough EXAM: CHEST  2 VIEW COMPARISON:  05/28/2017 FINDINGS: There is some rotational artifact. This accentuates the cardiac silhouette. No pleural effusions or edema. No airspace opacities. Degenerative changes noted within the spine. IMPRESSION: 1. No acute cardiopulmonary abnormalities. Electronically Signed   By: Kerby Moors M.D.   On: 05/29/2017 21:01   Dg Chest 2 View  Result Date: 05/28/2017 CLINICAL DATA:  Chest pain for 2 weeks EXAM: CHEST  2 VIEW COMPARISON:  February 01, 2017 FINDINGS: Lungs are clear. Heart size and pulmonary vascularity are normal. No pneumothorax. No adenopathy. There is mild degenerative change in the thoracic spine. There is a lap band at the gastric cardia level. IMPRESSION: No edema or consolidation. Electronically Signed   By: Lowella Grip III M.D.   On: 05/28/2017 12:21    Procedures Procedures (including critical care time) CRITICAL CARE Performed by: Forde Dandy   Total critical care time: 35 minutes  Critical care time was exclusive of separately billable procedures and  treating other patients.  Critical care was necessary to treat or prevent imminent or life-threatening deterioration.  Critical care was time spent personally by me on the following activities: development of treatment plan with patient and/or surrogate as well as nursing, discussions with consultants, evaluation of patient's response to treatment, examination of patient, obtaining history from patient or surrogate, ordering and performing treatments and interventions, ordering and review of laboratory studies, ordering and review of radiographic studies, pulse oximetry and re-evaluation of patient's condition.  Medications Ordered in ED Medications  sodium chloride 0.9 % bolus 1,000 mL (not administered)  acetaminophen (TYLENOL) tablet 1,000 mg (1,000 mg Oral Given 05/29/17 1943)  sodium chloride 0.9 % bolus 1,000 mL (1,000 mLs Intravenous New Bag/Given 05/29/17 1957)  sodium chloride 0.9 % bolus 1,000 mL (1,000 mLs Intravenous New Bag/Given 05/29/17 2019)  piperacillin-tazobactam (ZOSYN) IVPB 3.375 g (3.375 g Intravenous New Bag/Given 05/29/17 2020)     Initial Impression / Assessment and Plan / ED Course  I have reviewed the triage vital signs and the nursing notes.  Pertinent labs & imaging results that were available during my care of the patient were reviewed by me and considered in my medical decision making (see chart for details).     I reviewed the available records in [facility] and also Care Everywhere, updated the chart. Seen in ED yesterday for chest discomfort and black stools. Negative occult blood testing of stool.   With low grade fever in triage of 100.20F. I rechecked temperature in ED orally, and she is febrile 101.27F.She has leukocytosis of 11, elevated lactate of 4.3, and acute kidney injury of  1.9. Concern for sepsis, given that she was diagnosed with UTI yesterday likely urinary source. She is tender in the suprapubic abdomen, and we'll obtain CT abdomen and pelvis to rule out  a sigmoid diverticulitis or other acute intra-abdominal process, but may also be feeling pain due to her urinary tract infection. Rate is visualized and shows no acute pneumonia or other acute cardiopulmonary processes. Receive Zosyn for antibiotic coverage and IV fluids. Discussed with Dr. Marin Comment who will admit for ongoing management.    Sepsis - Repeat Assessment  Performed at:    9PM 05/29/2017  Vitals     Blood pressure (!) 129/56, pulse (!) 104, temperature 99.5 F (37.5 C), temperature source Oral, resp. rate 16, height 4\' 10"  (1.473 m), weight 129.7 kg (286 lb), last menstrual period 06/06/2013, SpO2 99 %.  Heart:     Irregular rate and rhythm and Tachycardic  Lungs:    CTA  Capillary Refill:   <2 sec  Peripheral Pulse:   Radial pulse palpable  Skin:     Normal Color     Final Clinical Impressions(s) / ED Diagnoses   Final diagnoses:  Sepsis, due to unspecified organism ([place])  Acute pyelonephritis  Acute kidney injury ([place])  Elevated lactic acid level    New Prescriptions New Prescriptions   No medications on file     Forde Dandy, MD 05/29/17 2133

## 2017-05-29 NOTE — ED Triage Notes (Signed)
Pt reports lower abd pain, fatigue for last several days. Pt reports coughed up blood this am, and intermittent black tarry stools. Pt alert and oriented. Airway patent. nad noted.

## 2017-05-29 NOTE — ED Notes (Signed)
Blood cultures drawn x 2 by lab

## 2017-05-29 NOTE — H&P (Signed)
History and Physical    Sara Tran:[id number] DOB: [birth year] DOA: 05/29/2017  PCP: Sharilyn Sites, MD  Patient coming from: Home.   Chief Complaint:  Blood tingued vomitus.   HPI: Sara Tran is an 61 y.o. female with hx of morbid obesity, OSA on CPAP, HTN, hx of lap banding 2011, DM, pulmonary HTN,  HLD, CKD, ashtma, presented to the ER with some blood in her vomitus.  She was seen for dark stool, but it was dark green rather an melena yesterday.  UA at that time showed UTI, and she was given Macrodantin.  She has no fever, but has suprapubic tenderness.  She has no rigor or chills, and denied epigastric pain.  Evaluation in the ER showed normotensive, elevated lactic acid to 3.8, WBC of 11K, and Cr of 1.9.  Her UA and CT of the abdomen is still pending.  She was given IV Zosyn and IVF, and hospitalist was asked to admit her for possible sepsis due to UTI, volume depletion with AKI on CKD, and possible slight hematemesis.   Her Hb is stable at 11.9 g per dL.   ED Course:  See above.  Rewiew of Systems:  Constitutional: Negative for malaise, fever and chills. No significant weight loss or weight gain Eyes: Negative for eye pain, redness and discharge, diplopia, visual changes, or flashes of light. ENMT: Negative for ear pain, hoarseness, nasal congestion, sinus pressure and sore throat. No headaches; tinnitus, drooling, or problem swallowing. Cardiovascular: Negative for chest pain, palpitations, diaphoresis, dyspnea and peripheral edema. ; No orthopnea, PND Respiratory: Negative for cough, hemoptysis, wheezing and stridor. No pleuritic chestpain. Gastrointestinal: Negative for diarrhea, constipation,  melena, blood in stool, jaundice and rectal bleeding.    Genitourinary: Negative for frequency, dysuria, incontinence,flank pain and hematuria; Musculoskeletal: Negative for back pain and neck pain. Negative for swelling and trauma.;  Skin: . Negative for pruritus, rash, abrasions,  bruising and skin lesion.; ulcerations Neuro: Negative for headache, lightheadedness and neck stiffness. Negative for weakness, altered level of consciousness , altered mental status, extremity weakness, burning feet, involuntary movement, seizure and syncope.  Psych: negative for anxiety, depression, insomnia, tearfulness, panic attacks, hallucinations, paranoia, suicidal or homicidal ideation   Past Medical History:  Diagnosis Date  . Adnexal cyst    Right simple cyst seen on [place]  . Arthritis   . Asthma   . Chronic back pain   . Chronic hip pain   . COPD (chronic obstructive pulmonary disease) ([place])   . Herpes simplex virus (HSV) infection   . History of cardiac catheterization    Normal coronaries 2013  . Hot flashes   . Hypertriglyceridemia   . IBS (irritable bowel syndrome)   . Internal hemorrhoids   . Morbid obesity ([place])    s/p lap band surgery  . Obesity   . Pulmonary HTN ([place])    [place] 2008:  RA pressures 13/10 with a mean of 7 mmHg.  RV pressure 82/99 with end diastolic pressure of 15 mmHg.  PA pressure 49/23 with a mean of 35 mmHg.  Pulmonary capillary wedge 17/50 with a mean of 14 mmHg. The PA saturation was 68%.  RA saturation was 71% and aortic saturation was 90%.  Cardiac output was 6.0 with a cardiac index of 2.80 by Fick.  Normal coronaries by cath 2008.  Marland Kitchen Renal insufficiency   . Sciatica of right side   . Sleep apnea    CPAP use  . Type 2 diabetes mellitus ([place])   .  Uterine fibroid     Past Surgical History:  Procedure Laterality Date  . CARDIAC CATHETERIZATION  07/2007, 05/2012   Normal coronary arteries  . COLONOSCOPY  10/18/10   SLF:2-mm sessile sigmoid polyp/diverticula  . HEMORRHOID BANDING  07/2012   Dr. Oneida Alar  . LAPAROSCOPIC CHOLECYSTECTOMY  1985  . LAPAROSCOPIC GASTRIC BANDING  12/12/2010  . LEFT AND RIGHT HEART CATHETERIZATION WITH CORONARY ANGIOGRAM N/A 06/03/2012   Procedure: LEFT AND RIGHT HEART CATHETERIZATION WITH CORONARY ANGIOGRAM;  Surgeon:  Jolaine Artist, MD;  Location: [facility] CATH LAB;  Service: Cardiovascular;  Laterality: N/A;  . PILONIDAL CYST EXCISION  1974  . 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 TUMOR EXCISION  10/2011   Face     reports that she quit smoking about 20 years ago. Her smoking use included Cigarettes. She has never used smokeless tobacco. She reports that she does not drink alcohol or use drugs.  Allergies  Allergen Reactions  . Aspirin Other (See Comments)    Rectal bleeding  . Bactrim [Sulfamethoxazole-Trimethoprim] Other (See Comments)    Cramping,bloating,vomiting,fever,headaches  . Bee Venom Swelling    As a child  . Ceftin [Cefuroxime Axetil] Swelling    Swollen tongue, "made me crazy"  . Ciprofloxacin Cough    Coughed up blood  . Milk-Related Compounds Other (See Comments)    IBS  . Latex Itching and Rash    Family History  Problem Relation Age of Onset  . Diabetes Mother   . Heart failure Mother   . Breast cancer Sister   . CAD Father   . Hypertension Father   . Cancer Paternal Grandfather   . Hypertension Paternal Grandmother   . Stroke Paternal Grandmother   . Other Maternal Grandmother        tonsilitis  . Diabetes Maternal Grandfather   . Hypertension Maternal Grandfather   . Breast cancer Paternal Aunt      Prior to Admission medications   Medication Sig Start Date End Date Taking? Authorizing Provider  albuterol (PROVENTIL HFA;VENTOLIN HFA) 108 (90 BASE) MCG/ACT inhaler Inhale 2 puffs into the lungs every 6 (six) hours as needed. FOR SHORTNESS OF BREATH/WHEEZING   Yes [provider]  albuterol (PROVENTIL) (2.5 MG/3ML) 0.083% nebulizer solution Take 2.5 mg by nebulization every 4 (four) hours as needed for wheezing or shortness of breath.  09/19/15  Yes [provider]  ALPRAZolam Duanne Moron) 0.5 MG tablet Take 0.5 mg by mouth 3 (three) times daily as needed. For anxiety/sleep 11/24/14  Yes [provider]  amLODipine (NORVASC) 10 MG tablet  Take 1 tablet by mouth every evening.  04/10/13  Yes [provider]  carisoprodol (SOMA) 350 MG tablet TAKE 1 TABLET 4 TIMES A DAY AS NEEDED FOR MUSCLE SPASM. 02/13/17  Yes Meredith Staggers, MD  Cholecalciferol (VITAMIN D3 PO) Take 1 capsule by mouth daily.   Yes [provider]  fenofibrate 160 MG tablet Take 160 mg by mouth daily. 07/21/15  Yes [provider]  fluticasone (FLONASE) 50 MCG/ACT nasal spray Place 1-2 sprays into the nose daily.    Yes [provider]  gabapentin (NEURONTIN) 300 MG capsule Take 300 mg by mouth 3 (three) times daily.   Yes [provider]  ipratropium (ATROVENT) 0.02 % nebulizer solution Take 0.5 mg by nebulization every 6 (six) hours as needed.  09/19/15  Yes [provider]  isosorbide mononitrate (IMDUR) 30 MG 24 hr tablet Take 30 mg by mouth every morning.  09/05/11  Yes [provider]  loratadine (CLARITIN) 10 MG tablet Take 1 tablet by mouth daily. 05/27/17  Yes [provider]  losartan (COZAAR) 100 MG tablet Take 100 mg by mouth every morning.    Yes [provider]  magnesium oxide (MAG-OX) 400 MG tablet Take 400 mg by mouth daily.   Yes [provider]  metFORMIN (GLUCOPHAGE-XR) 500 MG 24 hr tablet Take 1 tablet by mouth daily. 05/20/17  Yes [provider]  Multiple Vitamins-Minerals (ONE-A-DAY 50 PLUS PO) Take 1 tablet by mouth daily.   Yes [provider]  nitrofurantoin, macrocrystal-monohydrate, (MACROBID) 100 MG capsule Take 1 capsule (100 mg total) by mouth 2 (two) times daily. 05/28/17 06/02/17 Yes Phelps, Aviva Signs Y, DO  nitroGLYCERIN (NITROSTAT) 0.4 MG SL tablet Place 0.4 mg under the tongue every 5 (five) minutes as needed. Reported on 06/14/2016   Yes [provider]  potassium chloride SA (K-DUR,KLOR-CON) 20 MEQ tablet Take 20 mEq by mouth every morning.    Yes [provider]  tiZANidine (ZANAFLEX) 4 MG tablet Take 4 mg by mouth  every 8 (eight) hours as needed for muscle spasms.   Yes [provider]  predniSONE (DELTASONE) 20 MG tablet Take 1 tablet (20 mg total) by mouth as directed. Take 1 tab 3x daily for 4 days then 1 tab 2x daily for 4 days then 1 tab once daily for 4 days, then 1/2 tab daily for 4 days and off. Patient not taking: Reported on 05/04/2017 04/12/17   Meredith Staggers, MD    Physical Exam: Vitals:   05/29/17 2021 05/29/17 2030 05/29/17 2100 05/29/17 2130  BP:  (!) 126/48 (!) 129/56 (!) 125/47  Pulse:  (!) 108 (!) 104 (!) 101  Resp:  19 16 15   Temp: 99.5 F (37.5 C)     TempSrc: Oral     SpO2:  97% 99% 99%  Weight:      Height:          Constitutional: NAD, calm, comfortable Vitals:   05/29/17 2021 05/29/17 2030 05/29/17 2100 05/29/17 2130  BP:  (!) 126/48 (!) 129/56 (!) 125/47  Pulse:  (!) 108 (!) 104 (!) 101  Resp:  19 16 15   Temp: 99.5 F (37.5 C)     TempSrc: Oral     SpO2:  97% 99% 99%  Weight:      Height:       Eyes: PERRL, lids and conjunctivae normal ENMT: Mucous membranes are moist. Posterior pharynx clear of any exudate or lesions.Normal dentition.  Neck: normal, supple, no masses, no thyromegaly Respiratory: clear to auscultation bilaterally, no wheezing, no crackles. Normal respiratory effort. No accessory muscle use.  Cardiovascular: Regular rate and rhythm, no murmurs / rubs / gallops. No extremity edema. 2+ pedal pulses. No carotid bruits.  Abdomen: no tenderness, no masses palpated. No hepatosplenomegaly. Bowel sounds positive.  Musculoskeletal: no clubbing / cyanosis. No joint deformity upper and lower extremities. Good ROM, no contractures. Normal muscle tone.  Skin: no rashes, lesions, ulcers. No induration Neurologic: CN 2-12 grossly intact. Sensation intact, DTR normal. Strength 5/5 in all 4.  Psychiatric: Normal judgment and insight. Alert and oriented x 3. Normal mood.     Labs on Admission: I have personally reviewed following labs and imaging  studies  CBC:  Recent Labs Lab 05/28/17 1137 05/29/17 1842  WBC 9.8 11.2*  NEUTROABS  --  PENDING  HGB 12.7 11.9*  HCT 38.4 36.8  MCV 97.5 98.4  PLT 302 480   Basic Metabolic Panel:  Recent Labs Lab 05/28/17 1137 05/29/17 1842  NA 138 140  K 3.8 4.3  CL 103 102  CO2 24 24  GLUCOSE 222* 210*  BUN 13 16  CREATININE 1.27* 1.92*  CALCIUM 9.7 10.2   GFR: Estimated Creatinine Clearance: 38.1 mL/min (A) (by C-G formula based on SCr of 1.92 mg/dL (H)). Liver Function Tests:  Recent Labs Lab 05/29/17 1842  AST 26  ALT 25  ALKPHOS 44  BILITOT 0.7  PROT 7.5  ALBUMIN 3.9   Urine analysis:    Component Value Date/Time   COLORURINE AMBER (A) 05/28/2017 1413   APPEARANCEUR TURBID (A) 05/28/2017 1413   LABSPEC 1.015 05/28/2017 1413   PHURINE 6.0 05/28/2017 1413   GLUCOSEU NEGATIVE 05/28/2017 1413   HGBUR LARGE (A) 05/28/2017 1413   BILIRUBINUR NEGATIVE 05/28/2017 1413   KETONESUR NEGATIVE 05/28/2017 1413   PROTEINUR 100 (A) 05/28/2017 1413   UROBILINOGEN 0.2 09/09/2013 1911   NITRITE NEGATIVE 05/28/2017 1413   LEUKOCYTESUR LARGE (A) 05/28/2017 1413    Radiological Exams on Admission: Dg Chest 2 View  Result Date: 05/29/2017 CLINICAL DATA:  Fatigue.  SOB.  Cough EXAM: CHEST  2 VIEW COMPARISON:  05/28/2017 FINDINGS: There is some rotational artifact. This accentuates the cardiac silhouette. No pleural effusions or edema. No airspace opacities. Degenerative changes noted within the spine. IMPRESSION: 1. No acute cardiopulmonary abnormalities. Electronically Signed   By: Kerby Moors M.D.   On: 05/29/2017 21:01   Dg Chest 2 View  Result Date: 05/28/2017 CLINICAL DATA:  Chest pain for 2 weeks EXAM: CHEST  2 VIEW COMPARISON:  February 01, 2017 FINDINGS: Lungs are clear. Heart size and pulmonary vascularity are normal. No pneumothorax. No adenopathy. There is mild degenerative change in the thoracic spine. There is a lap band at the gastric cardia level. IMPRESSION: No  edema or consolidation. Electronically Signed   By: Lowella Grip III M.D.   On: 05/28/2017 12:21    EKG: Independently reviewed.   Assessment/Plan Principal Problem:   Sepsis due to urinary tract infection (HCC) Active Problems:   Morbid obesity ([place])   Diabetes mellitus ([place])   IBS (irritable bowel syndrome)-DIARRHEA   Acute on chronic renal insufficiency   GI bleed   UTI (urinary tract infection)    PLAN:   Blood tingued vomitus:  Could be MW tear or esophagitis, but not significant.  Will give PPI IV BID, and follow Hct.   Sepsis due to UTI:  Will continue with Zosyn.  BC and urine cultures were repeated.    Lactic acid may be due to hypoperfusion and dehydration rather than true sepsis.  Will continue with IVF.  OSA:  Will continue with CPAP Q hs.  DM:  Will hold metformin.  Use SSI.    Suprapublic tenderness:  Suspect cystitis, but EDP ordered CT of the abdomen, result pending.  Please check on the result.    AKI on CKD:  Will hold ACE I, and follow Cr.  Continue with IVF>    DVT prophylaxis: heparin SQ Code Status: FULL CODE.  Family Communication: None.  Disposition Plan: Home.  Consults called: None.  Admission status: inpatient.    Majour Frei MD FACP. [facility]  If 7PM-7AM, please contact night-coverage [email or web address] Password [email or web address]  05/29/2017, 10:01 PM

## 2017-05-29 NOTE — ED Notes (Signed)
Both #1 and #2 bags of NS are running wide open. Longer time running due to small IV gauge.

## 2017-05-29 NOTE — ED Notes (Signed)
Date and time results received: 05/29/17 1946  Lactic Acid I Stat- 4.3  Name of Provider Notified:Dr Viviana Simpler Orders Received? Or Actions Taken?:MD notified- code sepsis called.

## 2017-05-29 NOTE — ED Notes (Signed)
Lab notified of need for blood culture draw.

## 2017-05-30 DIAGNOSIS — N289 Disorder of kidney and ureter, unspecified: Secondary | ICD-10-CM

## 2017-05-30 DIAGNOSIS — N3001 Acute cystitis with hematuria: Secondary | ICD-10-CM

## 2017-05-30 DIAGNOSIS — E119 Type 2 diabetes mellitus without complications: Secondary | ICD-10-CM

## 2017-05-30 DIAGNOSIS — N179 Acute kidney failure, unspecified: Secondary | ICD-10-CM

## 2017-05-30 DIAGNOSIS — R102 Pelvic and perineal pain: Secondary | ICD-10-CM

## 2017-05-30 DIAGNOSIS — N390 Urinary tract infection, site not specified: Secondary | ICD-10-CM

## 2017-05-30 DIAGNOSIS — R7989 Other specified abnormal findings of blood chemistry: Secondary | ICD-10-CM

## 2017-05-30 DIAGNOSIS — N189 Chronic kidney disease, unspecified: Secondary | ICD-10-CM

## 2017-05-30 DIAGNOSIS — A419 Sepsis, unspecified organism: Principal | ICD-10-CM

## 2017-05-30 LAB — CBC
HCT: 33.4 % — ABNORMAL LOW (ref 36.0–46.0)
HCT: 32.5 % — ABNORMAL LOW (ref 36.0–46.0)
HCT: 31.2 % — ABNORMAL LOW (ref 36.0–46.0)
Hemoglobin: 10.8 g/dL — ABNORMAL LOW (ref 12.0–15.0)
Hemoglobin: 10.6 g/dL — ABNORMAL LOW (ref 12.0–15.0)
Hemoglobin: 10.3 g/dL — ABNORMAL LOW (ref 12.0–15.0)
MCH: 32.3 pg (ref 26.0–34.0)
MCH: 32.2 pg (ref 26.0–34.0)
MCH: 32.4 pg (ref 26.0–34.0)
MCHC: 32.3 g/dL (ref 30.0–36.0)
MCHC: 32.6 g/dL (ref 30.0–36.0)
MCHC: 33 g/dL (ref 30.0–36.0)
MCV: 100 fL (ref 78.0–100.0)
MCV: 98.8 fL (ref 78.0–100.0)
MCV: 98.1 fL (ref 78.0–100.0)
Platelets: 240 10*3/uL (ref 150–400)
Platelets: 244 10*3/uL (ref 150–400)
Platelets: 236 10*3/uL (ref 150–400)
RBC: 3.34 MIL/uL — ABNORMAL LOW (ref 3.87–5.11)
RBC: 3.29 MIL/uL — ABNORMAL LOW (ref 3.87–5.11)
RBC: 3.18 MIL/uL — ABNORMAL LOW (ref 3.87–5.11)
RDW: 13.6 % (ref 11.5–15.5)
RDW: 13.4 % (ref 11.5–15.5)
RDW: 13.4 % (ref 11.5–15.5)
WBC: 7.2 10*3/uL (ref 4.0–10.5)
WBC: 7.8 10*3/uL (ref 4.0–10.5)
WBC: 7.1 10*3/uL (ref 4.0–10.5)

## 2017-05-30 LAB — TYPE AND SCREEN
ABO/RH(D): A POS
Antibody Screen: POSITIVE
DAT, IgG: NEGATIVE
Unit division: 0
Unit division: 0
Unit division: 0

## 2017-05-30 LAB — BPAM RBC
Blood Product Expiration Date: 201806082359
Blood Product Expiration Date: 201806082359
Blood Product Expiration Date: 201806082359
Unit Type and Rh: 6200
Unit Type and Rh: 6200
Unit Type and Rh: 6200

## 2017-05-30 LAB — GLUCOSE, CAPILLARY
Glucose-Capillary: 166 mg/dL — ABNORMAL HIGH (ref 65–99)
Glucose-Capillary: 182 mg/dL — ABNORMAL HIGH (ref 65–99)
Glucose-Capillary: 158 mg/dL — ABNORMAL HIGH (ref 65–99)
Glucose-Capillary: 125 mg/dL — ABNORMAL HIGH (ref 65–99)
Glucose-Capillary: 169 mg/dL — ABNORMAL HIGH (ref 65–99)

## 2017-05-30 LAB — LACTIC ACID, PLASMA: Lactic Acid, Venous: 2.8 mmol/L (ref 0.5–1.9)

## 2017-05-30 LAB — TSH: TSH: 1.875 u[IU]/mL (ref 0.350–4.500)

## 2017-05-30 MED ORDER — PIPERACILLIN-TAZOBACTAM 3.375 G IVPB
3.3750 g | Freq: Three times a day (TID) | INTRAVENOUS | Status: DC
  Administered 2017-05-30 – 2017-06-01 (×7): 3.375 g via INTRAVENOUS
  Filled 2017-05-30 (×10): qty 50

## 2017-05-30 MED ORDER — FENTANYL CITRATE (PF) 100 MCG/2ML IJ SOLN
50.0000 ug | INTRAMUSCULAR | Status: DC | PRN
  Administered 2017-05-30 – 2017-06-01 (×8): 50 ug via INTRAVENOUS
  Administered 2017-06-01: 15:00:00 via INTRAVENOUS
  Administered 2017-06-01: 50 ug via INTRAVENOUS
  Filled 2017-05-30 (×10): qty 2

## 2017-05-30 NOTE — Care Management Note (Signed)
Case Management Note  Patient Details  Name: Sara Tran MRN: [id number] Date of Birth: 1956-06-20  Subjective/Objective:                  Admitted with sepsis. Chart reviewed for CM needs. Pt from home, lives alone, ind with ADL's. Has PCP, transportation and insurance with drug coverage.   Action/Plan: Plan for return home with self care. No CM needs anticipated. CM will follow to DC.   Expected Discharge Date:     06/01/2017             Expected Discharge Plan:  Home/Self Care  In-House Referral:  NA  Discharge planning Services  NA  Post Acute Care Choice:  NA Choice offered to:  NA  Status of Service:  Completed, signed off  Sherald Barge, RN 05/30/2017, 2:55 PM

## 2017-05-30 NOTE — Progress Notes (Signed)
PROGRESS NOTE    Sara Tran  MOQ:[id number] DOB: [birth year] DOA: 05/29/2017 PCP: Sharilyn Sites, MD    Brief Narrative:  Sara Tran is an 61 y.o. female with hx of morbid obesity, OSA on CPAP, HTN, hx of lap banding 2011, DM, pulmonary HTN,  HLD, CKD, ashtma, presented to the ER with some blood in her vomitus.  She was seen for dark stool, but it was dark green rather an melena yesterday.  UA at that time showed UTI, and she was given Macrodantin.  She has no fever, but has suprapubic tenderness.  She has no rigor or chills, and denied epigastric pain.  Evaluation in the ER showed normotensive, elevated lactic acid to 3.8, WBC of 11K, and Cr of 1.9.  Her UA and CT of the abdomen is still pending.  She was given IV Zosyn and IVF, and hospitalist was asked to admit her for possible sepsis due to UTI, volume depletion with AKI on CKD, and possible slight hematemesis.   Her Hb is stable at 11.9 g per dL.    Assessment & Plan:   Principal Problem:   Sepsis due to urinary tract infection ([place]) Active Problems:   Morbid obesity ([place])   Diabetes mellitus ([place])   IBS (irritable bowel syndrome)-DIARRHEA   Acute on chronic renal insufficiency   GI bleed   UTI (urinary tract infection)   Blood tingued vomitus - Could be MW tear or esophagitis, but not significant - continue PPI IV BID - repeat CBC in am  - expect some dilutional effect  Sepsis due to UTI - Will continue with Zosyn - BC showing NG <12 hours - urine culture pending   Right renal calculus - urology consulted - no hydronephrosis seen  OSA - Will continue with CPAP Q hs.  DM - Will hold metformin.  Use SSI.    Suprapublic tenderness - CT of abdomen showing Colonic diverticulosis. No evidence of bowel obstruction or active inflammation. Right renal staghorn calculus.  No hydronephrosis. Fatty liver. Calcified uterine fibroid. Atherosclerotic aorta.  - continue on IV Zosyn  AKI on CKD - repeat BMP in am -  urinating well - pending Cr in am will order ultrasound if increase   DVT prophylaxis: heparin SQ Code Status: FULL CODE.  Family Communication: None.  Disposition Plan: Home.    Consultants:   Urology  Procedures:   None  Antimicrobials:   Zosyn    Subjective: Patient reports she is urinating well but is still having some burning with urination.  She voices that she feels better than she did when she came in.  Asking if she can be evaluated for oxygen while hospitalized.  She denies any further emesis.  Objective: Vitals:   05/29/17 2320 05/30/17 0015 05/30/17 0618 05/30/17 0752  BP: (!) 109/55  (!) 130/47   Pulse: (!) 102  97   Resp: 18  16   Temp: 98.7 F (37.1 C)  99 F (37.2 C)   TempSrc: Oral  Oral   SpO2: 97% 100% 98% 93%  Weight: 131.9 kg (290 lb 11.2 oz)     Height: 4\' 10"  (1.473 m)       Intake/Output Summary (Last 24 hours) at 05/30/17 0818 Last data filed at 05/30/17 0619  Gross per 24 hour  Intake          2518.75 ml  Output              300 ml  Net  2218.75 ml   Filed Weights   05/29/17 1832 05/29/17 2320  Weight: 129.7 kg (286 lb) 131.9 kg (290 lb 11.2 oz)    Examination:  General exam: Appears calm and comfortable  Respiratory system: Clear to auscultation. Respiratory effort normal. Cardiovascular system: S1 & S2 heard, RRR. No JVD, murmurs, rubs, gallops or clicks. No pedal edema. Gastrointestinal system: Abdomen is obese, nondistended, soft and slightly tender in the suprapubic area. No organomegaly or masses felt. Normal bowel sounds heard. Central nervous system: Alert and oriented. No focal neurological deficits. Extremities: Symmetric 5 x 5 power. Able to ambulate without concern Skin: No rashes, lesions or ulcers Psychiatry: Judgement and insight appear normal. Mood & affect appropriate.     Data Reviewed: I have personally reviewed following labs and imaging studies  CBC:  Recent Labs Lab 05/28/17 1137  05/29/17 1842 05/30/17 0652  WBC 9.8 11.2* 7.2  NEUTROABS  --  PENDING  --   HGB 12.7 11.9* 10.8*  HCT 38.4 36.8 33.4*  MCV 97.5 98.4 100.0  PLT 302 299 353   Basic Metabolic Panel:  Recent Labs Lab 05/28/17 1137 05/29/17 1842  NA 138 140  K 3.8 4.3  CL 103 102  CO2 24 24  GLUCOSE 222* 210*  BUN 13 16  CREATININE 1.27* 1.92*  CALCIUM 9.7 10.2   GFR: Estimated Creatinine Clearance: 38.5 mL/min (A) (by C-G formula based on SCr of 1.92 mg/dL (H)). Liver Function Tests:  Recent Labs Lab 05/29/17 1842  AST 26  ALT 25  ALKPHOS 44  BILITOT 0.7  PROT 7.5  ALBUMIN 3.9   No results for input(s): LIPASE, AMYLASE in the last 168 hours. No results for input(s): AMMONIA in the last 168 hours. Coagulation Profile: No results for input(s): INR, PROTIME in the last 168 hours. Cardiac Enzymes: No results for input(s): CKTOTAL, CKMB, CKMBINDEX, TROPONINI in the last 168 hours. BNP (last 3 results) No results for input(s): PROBNP in the last 8760 hours. HbA1C: No results for input(s): HGBA1C in the last 72 hours. CBG:  Recent Labs Lab 05/29/17 2320 05/30/17 0310 05/30/17 0738  GLUCAP 202* 166* 182*   Lipid Profile: No results for input(s): CHOL, HDL, LDLCALC, TRIG, CHOLHDL, LDLDIRECT in the last 72 hours. Thyroid Function Tests:  Recent Labs  05/29/17 2017  TSH 1.875   Anemia Panel: No results for input(s): VITAMINB12, FOLATE, FERRITIN, TIBC, IRON, RETICCTPCT in the last 72 hours. Sepsis Labs:  Recent Labs Lab 05/29/17 1903 05/29/17 1930 05/29/17 2238 05/30/17 0650  LATICACIDVEN 3.8* 4.30* 2.3* 2.8*    Recent Results (from the past 240 hour(s))  Blood culture (routine x 2)     Status: None (Preliminary result)   Collection Time: 05/29/17  8:10 PM  Result Value Ref Range Status   Specimen Description BLOOD LEFT ARM  Final   Special Requests   Final    BOTTLES DRAWN AEROBIC AND ANAEROBIC Blood Culture adequate volume   Culture NO GROWTH < 12 HOURS   Final   Report Status PENDING  Incomplete  Blood culture (routine x 2)     Status: None (Preliminary result)   Collection Time: 05/29/17  8:17 PM  Result Value Ref Range Status   Specimen Description BLOOD RIGHT ARM  Final   Special Requests   Final    BOTTLES DRAWN AEROBIC AND ANAEROBIC Blood Culture adequate volume   Culture NO GROWTH < 12 HOURS  Final   Report Status PENDING  Incomplete  Radiology Studies: Ct Abdomen Pelvis Wo Contrast  Result Date: 05/29/2017 CLINICAL DATA:  61 year old female with suprapubic abdominal pain. EXAM: CT ABDOMEN AND PELVIS WITHOUT CONTRAST TECHNIQUE: Multidetector CT imaging of the abdomen and pelvis was performed following the standard protocol without IV contrast. COMPARISON:  None. FINDINGS: Evaluation of this exam is limited in the absence of intravenous contrast. Lower chest: The visualized lung bases are clear. No intra-abdominal free air or free fluid. Hepatobiliary: Diffuse fatty infiltration of the liver. No intrahepatic biliary ductal dilatation. Cholecystectomy. Pancreas: Unremarkable. No pancreatic ductal dilatation or surrounding inflammatory changes. Spleen: Normal in size without focal abnormality. Adrenals/Urinary Tract: The adrenal glands are unremarkable. There is a large staghorn calculus extending from the renal pelvis into the interpolar and lower pole collecting system of the right kidney. There is no hydronephrosis. The left kidney is unremarkable. The visualized ureters appear unremarkable as well. The urinary bladder is predominantly collapsed. Stomach/Bowel: A gastric lap band is noted which appears in appropriate orientation. Small scattered colonic diverticula noted without active inflammation. Loose stool noted in the proximal colon. There is no evidence of bowel obstruction or active inflammation. The appendix is not visualized with certainty. No inflammatory changes identified in the right lower quadrant. Vascular/Lymphatic:  There is mild aortoiliac atherosclerotic disease. The abdominal aorta and IVC are otherwise unremarkable on this noncontrast study. No portal venous gas identified. There is no adenopathy. Reproductive: There is a 7 x 5 cm calcified uterine fibroids as well as smaller calcified fibroids. The ovaries appear unremarkable. Other: Small fat containing umbilical hernia. Musculoskeletal: Degenerative changes of the spine. No acute osseous pathology. IMPRESSION: 1. Colonic diverticulosis. No evidence of bowel obstruction or active inflammation. 2. Right renal staghorn calculus.  No hydronephrosis. 3. Fatty liver. 4. Calcified uterine fibroid. 5. Atherosclerotic aorta. Electronically Signed   By: Anner Crete M.D.   On: 05/29/2017 22:39   Dg Chest 2 View  Result Date: 05/29/2017 CLINICAL DATA:  Fatigue.  SOB.  Cough EXAM: CHEST  2 VIEW COMPARISON:  05/28/2017 FINDINGS: There is some rotational artifact. This accentuates the cardiac silhouette. No pleural effusions or edema. No airspace opacities. Degenerative changes noted within the spine. IMPRESSION: 1. No acute cardiopulmonary abnormalities. Electronically Signed   By: Kerby Moors M.D.   On: 05/29/2017 21:01   Dg Chest 2 View  Result Date: 05/28/2017 CLINICAL DATA:  Chest pain for 2 weeks EXAM: CHEST  2 VIEW COMPARISON:  February 01, 2017 FINDINGS: Lungs are clear. Heart size and pulmonary vascularity are normal. No pneumothorax. No adenopathy. There is mild degenerative change in the thoracic spine. There is a lap band at the gastric cardia level. IMPRESSION: No edema or consolidation. Electronically Signed   By: Lowella Grip III M.D.   On: 05/28/2017 12:21        Scheduled Meds: . amLODipine  10 mg Oral QPM  . cholecalciferol  1,000 Units Oral Daily  . gabapentin  300 mg Oral TID  . heparin  7,500 Units Subcutaneous Q8H  . insulin aspart  0-15 Units Subcutaneous Q4H  . isosorbide mononitrate  30 mg Oral Daily  . pantoprazole (PROTONIX)  IV  40 mg Intravenous Q12H   Continuous Infusions: . sodium chloride 75 mL/hr at 05/29/17 2350  . piperacillin-tazobactam (ZOSYN)  IV 3.375 g (05/30/17 0525)  . piperacillin-tazobactam (ZOSYN)  IV       LOS: 1 day    Time spent: 35 minutes    Loretha Stapler, MD [facility] Pager [phone]  If  7PM-7AM, please contact night-coverage [email or web address] Password [email or web address] 05/30/2017, 8:18 AM

## 2017-05-30 NOTE — Progress Notes (Signed)
CRITICAL VALUE ALERT  Critical Value:  Lactic Acid 2.8  Date & Time Notied:  05/30/17 0735  Provider Notified:Dr Adair Patter  Orders Received/Actions taken: Yes Consult urology

## 2017-05-30 NOTE — Progress Notes (Signed)
ANTIBIOTIC CONSULT NOTE  Pharmacy Consult for Zosyn Indication: UTI/Sepsis  Allergies  Allergen Reactions  . Aspirin Other (See Comments)    Rectal bleeding  . Bactrim [Sulfamethoxazole-Trimethoprim] Other (See Comments)    Cramping,bloating,vomiting,fever,headaches  . Bee Venom Swelling    As a child  . Ceftin [Cefuroxime Axetil] Swelling    Swollen tongue, "made me crazy"  . Ciprofloxacin Cough    Coughed up blood  . Milk-Related Compounds Other (See Comments)    IBS  . Latex Itching and Rash   Patient Measurements: Height: 4\' 10"  (147.3 cm) Weight: 290 lb 11.2 oz (131.9 kg) IBW/kg (Calculated) : 40.9  Vital Signs: Temp: 99 F (37.2 C) (05/31 0618) Temp Source: Oral (05/31 0618) BP: 130/47 (05/31 0618) Pulse Rate: 97 (05/31 0618)  Labs:  Recent Labs  05/28/17 1137 05/29/17 1842 05/30/17 0652  WBC 9.8 11.2* 7.2  HGB 12.7 11.9* 10.8*  PLT 302 299 240  CREATININE 1.27* 1.92*  --    Estimated Creatinine Clearance: 38.5 mL/min (A) (by C-G formula based on SCr of 1.92 mg/dL (H)).  No results for input(s): VANCOTROUGH, VANCOPEAK, VANCORANDOM, GENTTROUGH, GENTPEAK, GENTRANDOM, TOBRATROUGH, TOBRAPEAK, TOBRARND, AMIKACINPEAK, AMIKACINTROU, AMIKACIN in the last 72 hours.   Microbiology: Recent Results (from the past 720 hour(s))  Blood culture (routine x 2)     Status: None (Preliminary result)   Collection Time: 05/29/17  8:10 PM  Result Value Ref Range Status   Specimen Description BLOOD LEFT ARM  Final   Special Requests   Final    BOTTLES DRAWN AEROBIC AND ANAEROBIC Blood Culture adequate volume   Culture PENDING  Incomplete   Report Status PENDING  Incomplete  Blood culture (routine x 2)     Status: None (Preliminary result)   Collection Time: 05/29/17  8:17 PM  Result Value Ref Range Status   Specimen Description BLOOD RIGHT ARM  Final   Special Requests   Final    BOTTLES DRAWN AEROBIC AND ANAEROBIC Blood Culture adequate volume   Culture PENDING   Incomplete   Report Status PENDING  Incomplete   Medical History: Past Medical History:  Diagnosis Date  . Adnexal cyst    Right simple cyst seen on [place]  . Arthritis   . Asthma   . Chronic back pain   . Chronic hip pain   . COPD (chronic obstructive pulmonary disease) ([place])   . Herpes simplex virus (HSV) infection   . History of cardiac catheterization    Normal coronaries 2013  . Hot flashes   . Hypertriglyceridemia   . IBS (irritable bowel syndrome)   . Internal hemorrhoids   . Morbid obesity ([place])    s/p lap band surgery  . Obesity   . Pulmonary HTN ([place])    [place] 2008:  RA pressures 13/10 with a mean of 7 mmHg.  RV pressure 78/29 with end diastolic pressure of 15 mmHg.  PA pressure 49/23 with a mean of 35 mmHg.  Pulmonary capillary wedge 17/50 with a mean of 14 mmHg. The PA saturation was 68%.  RA saturation was 71% and aortic saturation was 90%.  Cardiac output was 6.0 with a cardiac index of 2.80 by Fick.  Normal coronaries by cath 2008.  Marland Kitchen Renal insufficiency   . Sciatica of right side   . Sleep apnea    CPAP use  . Type 2 diabetes mellitus ([place])   . Uterine fibroid    Medications:  Zosyn 3.375 Gm IV x 1 dose in the ED  Assessment: 61 yo female seen in the ED with blood tinged vomitus and suprapubic tenderness; received OP treatment for UTI. WBCs are elevated as is lactic acid. Zosyn to be given for possible sepsis due to UTI.  Goal of Therapy:  Eradicate infection  Plan:  Zosyn 3.375gm IV q8h, EID Monitor labs, progress, c/s Transition to PO ABX when deemed appropriate  Hart Robinsons A, RPH 05/30/2017,7:36 AM

## 2017-05-31 LAB — GLUCOSE, CAPILLARY
Glucose-Capillary: 114 mg/dL — ABNORMAL HIGH (ref 65–99)
Glucose-Capillary: 143 mg/dL — ABNORMAL HIGH (ref 65–99)
Glucose-Capillary: 156 mg/dL — ABNORMAL HIGH (ref 65–99)
Glucose-Capillary: 172 mg/dL — ABNORMAL HIGH (ref 65–99)
Glucose-Capillary: 186 mg/dL — ABNORMAL HIGH (ref 65–99)

## 2017-05-31 LAB — CBC WITH DIFFERENTIAL/PLATELET
Basophils Absolute: 0 10*3/uL (ref 0.0–0.1)
Basophils Relative: 0 %
Eosinophils Absolute: 0.3 10*3/uL (ref 0.0–0.7)
Eosinophils Relative: 5 %
HCT: 35.5 % — ABNORMAL LOW (ref 36.0–46.0)
Hemoglobin: 11.6 g/dL — ABNORMAL LOW (ref 12.0–15.0)
Lymphocytes Relative: 24 %
Lymphs Abs: 1.7 10*3/uL (ref 0.7–4.0)
MCH: 31.8 pg (ref 26.0–34.0)
MCHC: 32.7 g/dL (ref 30.0–36.0)
MCV: 97.3 fL (ref 78.0–100.0)
Monocytes Absolute: 0.5 10*3/uL (ref 0.1–1.0)
Monocytes Relative: 7 %
Neutro Abs: 4.4 10*3/uL (ref 1.7–7.7)
Neutrophils Relative %: 64 %
Platelets: 284 10*3/uL (ref 150–400)
RBC: 3.65 MIL/uL — ABNORMAL LOW (ref 3.87–5.11)
RDW: 13.4 % (ref 11.5–15.5)
WBC: 6.9 10*3/uL (ref 4.0–10.5)

## 2017-05-31 LAB — BASIC METABOLIC PANEL
Anion gap: 9 (ref 5–15)
BUN: 7 mg/dL (ref 6–20)
CO2: 24 mmol/L (ref 22–32)
Calcium: 9.1 mg/dL (ref 8.9–10.3)
Chloride: 106 mmol/L (ref 101–111)
Creatinine, Ser: 1.25 mg/dL — ABNORMAL HIGH (ref 0.44–1.00)
GFR calc Af Amer: 53 mL/min — ABNORMAL LOW (ref >60)
GFR calc non Af Amer: 46 mL/min — ABNORMAL LOW (ref >60)
Glucose, Bld: 132 mg/dL — ABNORMAL HIGH (ref 65–99)
Potassium: 4.1 mmol/L (ref 3.5–5.1)
Sodium: 139 mmol/L (ref 135–145)

## 2017-05-31 LAB — URINE CULTURE: Culture: <10000 — AB

## 2017-05-31 LAB — HIV ANTIBODY (ROUTINE TESTING W REFLEX): HIV Screen 4th Generation wRfx: NONREACTIVE

## 2017-05-31 NOTE — Progress Notes (Signed)
Patient oxygen Saturation decreases with exertion and/ or off oxygen

## 2017-05-31 NOTE — Consult Note (Signed)
Subjective: CC: Right nephrolithiasis.  Hx: I was asked to see Sara Tran by Dr. Adair Patter for a right partial staghorn stone found on CT done for evaluation of sepsis of urinary origin.   She has had no flank pain or hematuria but has had recurrent UTI's for the last several year.   She was admitted on 5/30 for possible sepsis with a lactic acidosis.   She has some bladder discomfort but no fever or chills.   She has multiple antibiotic allergies.    ROS:  Review of Systems  Constitutional: Negative for chills and fever.  Cardiovascular: Negative for chest pain.  Gastrointestinal: Positive for abdominal pain, nausea and vomiting.  All other systems reviewed and are negative.   Allergies  Allergen Reactions  . Aspirin Other (See Comments)    Rectal bleeding  . Bactrim [Sulfamethoxazole-Trimethoprim] Other (See Comments)    Cramping,bloating,vomiting,fever,headaches  . Bee Venom Swelling    As a child  . Ceftin [Cefuroxime Axetil] Swelling    Swollen tongue, "made me crazy"  . Ciprofloxacin Cough    Coughed up blood  . Milk-Related Compounds Other (See Comments)    IBS  . Latex Itching and Rash    Past Medical History:  Diagnosis Date  . Adnexal cyst    Right simple cyst seen on [place]  . Arthritis   . Asthma   . Chronic back pain   . Chronic hip pain   . COPD (chronic obstructive pulmonary disease) ([place])   . Herpes simplex virus (HSV) infection   . History of cardiac catheterization    Normal coronaries 2013  . Hot flashes   . Hypertriglyceridemia   . IBS (irritable bowel syndrome)   . Internal hemorrhoids   . Morbid obesity ([place])    s/p lap band surgery  . Obesity   . Pulmonary HTN ([place])    [place] 2008:  RA pressures 13/10 with a mean of 7 mmHg.  RV pressure 78/29 with end diastolic pressure of 15 mmHg.  PA pressure 49/23 with a mean of 35 mmHg.  Pulmonary capillary wedge 17/50 with a mean of 14 mmHg. The PA saturation was 68%.  RA saturation was 71% and aortic  saturation was 90%.  Cardiac output was 6.0 with a cardiac index of 2.80 by Fick.  Normal coronaries by cath 2008.  Marland Kitchen Renal insufficiency   . Sciatica of right side   . Sleep apnea    CPAP use  . Type 2 diabetes mellitus ([place])   . Uterine fibroid     Past Surgical History:  Procedure Laterality Date  . CARDIAC CATHETERIZATION  07/2007, 05/2012   Normal coronary arteries  . COLONOSCOPY  10/18/10   SLF:2-mm sessile sigmoid polyp/diverticula  . HEMORRHOID BANDING  07/2012   Dr. Oneida Alar  . LAPAROSCOPIC CHOLECYSTECTOMY  1985  . LAPAROSCOPIC GASTRIC BANDING  12/12/2010  . LEFT AND RIGHT HEART CATHETERIZATION WITH CORONARY ANGIOGRAM N/A 06/03/2012   Procedure: LEFT AND RIGHT HEART CATHETERIZATION WITH CORONARY ANGIOGRAM;  Surgeon: Jolaine Artist, MD;  Location: [facility] CATH LAB;  Service: Cardiovascular;  Laterality: N/A;  . PILONIDAL CYST EXCISION  1974  . 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 TUMOR EXCISION  10/2011   Face    Social History   Social History  . Marital status: Single    Spouse name: N/A  . Number of children: 0  . Years of education: N/A   Occupational History  . Volunteers   . 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 Smoking status: Former Smoker    Types: Cigarettes    Quit date: 08/08/1996  . Smokeless tobacco: Never Used  . Alcohol use No  . Drug use: No  . Sexual activity: No   Other Topics Concern  . Not on file   Social History Narrative  . No narrative on file    Family History  Problem Relation Age of Onset  . Diabetes Mother   . Heart failure Mother   . Breast cancer Sister   . CAD Father   . Hypertension Father   . Cancer Paternal Grandfather   . Hypertension Paternal Grandmother   . Stroke Paternal Grandmother   . Other Maternal Grandmother        tonsilitis  . Diabetes Maternal Grandfather   . Hypertension Maternal Grandfather   . Breast cancer Paternal Aunt      Anti-infectives: Anti-infectives    Start     Dose/Rate Route Frequency Ordered Stop   05/30/17 1400  piperacillin-tazobactam (ZOSYN) IVPB 3.375 g     3.375 g 12.5 mL/hr over 240 Minutes Intravenous Every 8 hours 05/30/17 0734     05/30/17 0600  piperacillin-tazobactam (ZOSYN) IVPB 3.375 g     3.375 g 12.5 mL/hr over 240 Minutes Intravenous  Once 05/29/17 2326 05/30/17 0925   05/29/17 2030  piperacillin-tazobactam (ZOSYN) IVPB 3.375 g     3.375 g 100 mL/hr over 30 Minutes Intravenous  Once 05/29/17 2018 05/29/17 2140   05/29/17 2015  piperacillin-tazobactam (ZOSYN) IVPB 3.375 g  Status:  Discontinued     3.375 g 100 mL/hr over 30 Minutes Intravenous  Once 05/29/17 2004 05/29/17 2017      Current Facility-Administered Medications  Medication Dose Route Frequency Provider Last Rate Last Dose  . acetaminophen (TYLENOL) tablet 650 mg  650 mg Oral Q6H PRN Orvan Falconer, MD       Or  . acetaminophen (TYLENOL) suppository 650 mg  650 mg Rectal Q6H PRN Orvan Falconer, MD      . ALPRAZolam Duanne Moron) tablet 0.5 mg  0.5 mg Oral TID PRN Orvan Falconer, MD      . amLODipine (NORVASC) tablet 10 mg  10 mg Oral QPM Orvan Falconer, MD   10 mg at 05/30/17 1714  . cholecalciferol (VITAMIN D) tablet 1,000 Units  1,000 Units Oral Daily Orvan Falconer, MD   1,000 Units at 05/31/17 1020  . fentaNYL (SUBLIMAZE) injection 50 mcg  50 mcg Intravenous Q2H PRN Eber Jones, MD   50 mcg at 05/31/17 1312  . gabapentin (NEURONTIN) capsule 300 mg  300 mg Oral TID Orvan Falconer, MD   300 mg at 05/31/17 1019  . heparin injection 7,500 Units  7,500 Units Subcutaneous Q8H Orvan Falconer, MD   7,500 Units at 05/31/17 1608  . insulin aspart (novoLOG) injection 0-15 Units  0-15 Units Subcutaneous Q4H Orvan Falconer, MD   3 Units at 05/31/17 1311  . ipratropium-albuterol (DUONEB) 0.5-2.5 (3) MG/3ML nebulizer solution 2.5 mg  2.5 mg Nebulization Q4H PRN Orvan Falconer, MD   2.5 mg at 05/30/17 2024  . isosorbide mononitrate (IMDUR) 24 hr tablet 30 mg  30 mg  Oral Daily Orvan Falconer, MD   30 mg at 05/31/17 1019  . ondansetron (ZOFRAN) tablet 4 mg  4 mg Oral Q6H PRN Orvan Falconer, MD       Or  . ondansetron [facility]) injection 4 mg  4 mg Intravenous Q6H PRN  Orvan Falconer, MD      . pantoprazole (PROTONIX) injection 40 mg  40 mg Intravenous Q12H Orvan Falconer, MD   40 mg at 05/31/17 1020  . piperacillin-tazobactam (ZOSYN) IVPB 3.375 g  3.375 g Intravenous Q8H Ilene Qua U, MD 12.5 mL/hr at 05/31/17 1315 3.375 g at 05/31/17 1315     Objective: Vital signs in last 24 hours: Temp:  [97.8 F (36.6 C)-98.9 F (37.2 C)] 98.9 F (37.2 C) (06/01 1501) Pulse Rate:  [91-100] 91 (06/01 1501) Resp:  [18-20] 18 (06/01 1501) BP: (126-135)/(58-74) 133/74 (06/01 1501) SpO2:  [97 %-100 %] 100 % (06/01 1501)  Intake/Output from previous day: 05/31 0701 - 06/01 0700 In: 990 [P.O.:840; IV Piggyback:150] Out: 600 [Urine:600] Intake/Output this shift: No intake/output data recorded.   Physical Exam  Constitutional: She is oriented to person, place, and time.  Morbidly obese, BF in NAD  HENT:  Head: Normocephalic and atraumatic.  Neck: Normal range of motion. Neck supple.  Cardiovascular: Normal rate, regular rhythm and normal heart sounds.   Pulmonary/Chest: Effort normal and breath sounds normal. No respiratory distress.  Abdominal:  Soft, Obese with SP tenderness but no CVAT.  No mass or hernia.  Well healed right transverse surgical scar.   Musculoskeletal: Normal range of motion. She exhibits no edema or tenderness.  Lymphadenopathy:    She has no cervical adenopathy.  Neurological: She is alert and oriented to person, place, and time.  Skin: Skin is warm and dry.  Psychiatric: Mood and affect normal.  Vitals reviewed.   Lab Results:   Recent Labs  05/30/17 2306 05/31/17 0636  WBC 7.1 6.9  HGB 10.3* 11.6*  HCT 31.2* 35.5*  PLT 236 284   BMET  Recent Labs  05/29/17 1842 05/31/17 0636  NA 140 139  K 4.3 4.1  CL 102 106  CO2 24 24   GLUCOSE 210* 132*  BUN 16 7  CREATININE 1.92* 1.25*  CALCIUM 10.2 9.1   PT/INR No results for input(s): LABPROT, INR in the last 72 hours. ABG No results for input(s): PHART, HCO3 in the last 72 hours.  Invalid input(s): PCO2, PO2  Studies/Results: Ct Abdomen Pelvis Wo Contrast  Result Date: 05/29/2017 CLINICAL DATA:  61 year old female with suprapubic abdominal pain. EXAM: CT ABDOMEN AND PELVIS WITHOUT CONTRAST TECHNIQUE: Multidetector CT imaging of the abdomen and pelvis was performed following the standard protocol without IV contrast. COMPARISON:  None. FINDINGS: Evaluation of this exam is limited in the absence of intravenous contrast. Lower chest: The visualized lung bases are clear. No intra-abdominal free air or free fluid. Hepatobiliary: Diffuse fatty infiltration of the liver. No intrahepatic biliary ductal dilatation. Cholecystectomy. Pancreas: Unremarkable. No pancreatic ductal dilatation or surrounding inflammatory changes. Spleen: Normal in size without focal abnormality. Adrenals/Urinary Tract: The adrenal glands are unremarkable. There is a large staghorn calculus extending from the renal pelvis into the interpolar and lower pole collecting system of the right kidney. There is no hydronephrosis. The left kidney is unremarkable. The visualized ureters appear unremarkable as well. The urinary bladder is predominantly collapsed. Stomach/Bowel: A gastric lap band is noted which appears in appropriate orientation. Small scattered colonic diverticula noted without active inflammation. Loose stool noted in the proximal colon. There is no evidence of bowel obstruction or active inflammation. The appendix is not visualized with certainty. No inflammatory changes identified in the right lower quadrant. Vascular/Lymphatic: There is mild aortoiliac atherosclerotic disease. The abdominal aorta and IVC are otherwise unremarkable on this noncontrast study. No portal  venous gas identified. There is  no adenopathy. Reproductive: There is a 7 x 5 cm calcified uterine fibroids as well as smaller calcified fibroids. The ovaries appear unremarkable. Other: Small fat containing umbilical hernia. Musculoskeletal: Degenerative changes of the spine. No acute osseous pathology. IMPRESSION: 1. Colonic diverticulosis. No evidence of bowel obstruction or active inflammation. 2. Right renal staghorn calculus.  No hydronephrosis. 3. Fatty liver. 4. Calcified uterine fibroid. 5. Atherosclerotic aorta. Electronically Signed   By: Anner Crete M.D.   On: 05/29/2017 22:39   Dg Chest 2 View  Result Date: 05/29/2017 CLINICAL DATA:  Fatigue.  SOB.  Cough EXAM: CHEST  2 VIEW COMPARISON:  05/28/2017 FINDINGS: There is some rotational artifact. This accentuates the cardiac silhouette. No pleural effusions or edema. No airspace opacities. Degenerative changes noted within the spine. IMPRESSION: 1. No acute cardiopulmonary abnormalities. Electronically Signed   By: Kerby Moors M.D.   On: 05/29/2017 21:01   CT films and report reviewed.   Labs reviewed. Blood Cultures pending.   UCx insignificant growth. Assessment: Right partial staghorn stone with recurrent UTI's.    She has no obstruction or significant flank pain but with the recurrent infections we need to consider surgical intervention.   With her body habitus, the skin to stone distance would make a percutaneous approach problematic and she will need to be considered for ureteroscopic management but that will likely require 2-3 procedures.   I will arrange outpatient follow up.    CC: Dr. Grandville Silos     Malka So 05/31/2017 [phone]

## 2017-05-31 NOTE — Progress Notes (Signed)
PT Screening Note  Patient Details Name: Sara Tran MRN: [id number] DOB: March 07, 1956   Cancelled Treatment:    Reason Eval/Treat Not Completed: PT screened, no needs identified, will sign off. Chart reviewed, RN consulted.Met with patient and discussed PLOF and current course of illness at length. Pt is performing AMB in room without AD, and all mobility near baseline, with some mild acute/subacute limitations related to exacerbation of chronic pain. No skilled PT services are needed at this time. Pain is being managed and pt is educated on importance of remaining mobile while admitted to prevent additional decline in function. Pt would benefit from OPPT to address chronic limitations, but this can be addresses by PCP s/p DC.    1:47 PM, 05/31/17 Etta Grandchild, PT, DPT Physical Therapist - [place] [phone] (Office)    Buccola,Allan C 05/31/2017, 1:44 PM

## 2017-05-31 NOTE — Progress Notes (Signed)
PROGRESS NOTE    Sara Tran  ZHY:[id number] DOB: 1956-04-04 DOA: 05/29/2017 PCP: Sharilyn Sites, MD    Brief Narrative:  Sara Tran is an 61 y.o. female with hx of morbid obesity, OSA on CPAP, HTN, hx of lap banding 2011, DM, pulmonary HTN,  HLD, CKD, ashtma, presented to the ER with some blood in her vomitus.  She was seen for dark stool, but it was dark green rather an melena yesterday.  UA at that time showed UTI, and she was given Macrodantin.  She has no fever, but has suprapubic tenderness.  She has no rigor or chills, and denied epigastric pain.  Evaluation in the ER showed normotensive, elevated lactic acid to 3.8, WBC of 11K, and Cr of 1.9.  Her UA and CT of the abdomen is still pending.  She was given IV Zosyn and IVF, and hospitalist was asked to admit her for possible sepsis due to UTI, volume depletion with AKI on CKD, and possible slight hematemesis.   Her Hb is stable at 11.9 g per dL. Patient was started on IV Zosyn and urology was consulted.     Assessment & Plan:   Principal Problem:   Sepsis due to urinary tract infection ([place]) Active Problems:   Morbid obesity ([place])   Diabetes mellitus ([place])   IBS (irritable bowel syndrome)-DIARRHEA   Acute on chronic renal insufficiency   GI bleed   UTI (urinary tract infection)   Blood tingued vomitus - Could be MW tear or esophagitis, but not significant - continue PPI IV BID - improved H/H this am  SIRS (sepsis ruled out) - Will continue with Zosyn - BC showing NG x 2 days - urine culture showing insufficient growth   Right renal calculus - urology consulted - no hydronephrosis seen - will likely need intervention after infection improved  OSA - Will continue with CPAP Q hs.  DM - Will hold metformin.  Use SSI.    Suprapublic tenderness - CT of abdomen showing Colonic diverticulosis. No evidence of bowel obstruction or active inflammation. Right renal staghorn calculus.  No hydronephrosis. Fatty liver.  Calcified uterine fibroid. Atherosclerotic aorta.  - continue on IV Zosyn  AKI on CKD - improved Cr this am   DVT prophylaxis: heparin SQ Code Status: FULL CODE.  Family Communication: None.  Disposition Plan: Home.    Consultants:   Urology  Procedures:   None  Antimicrobials:   Zosyn    Subjective: Patient doing well.  No emesis and stools are becoming more formed and brown.  Voices she is very hungry.  States she discussed further management of her kidney stone with urology and she has "a lot to think about".    Objective: Vitals:   05/30/17 2026 05/30/17 2119 05/31/17 0611 05/31/17 1501  BP:  (!) 126/58 135/69 133/74  Pulse:  100 94 91  Resp:  20 20 18   Temp:  98.7 F (37.1 C) 97.8 F (36.6 C) 98.9 F (37.2 C)  TempSrc:  Oral Oral Oral  SpO2: 99% 97% 98% 100%  Weight:      Height:        Intake/Output Summary (Last 24 hours) at 05/31/17 1645 Last data filed at 05/31/17 0616  Gross per 24 hour  Intake              510 ml  Output              600 ml  Net              -  90 ml   Filed Weights   05/29/17 1832 05/29/17 2320  Weight: 129.7 kg (286 lb) 131.9 kg (290 lb 11.2 oz)    Examination:  General exam: Appears calm and comfortable  Respiratory system: Clear to auscultation. Respiratory effort normal. Cardiovascular system: S1 & S2 heard, RRR. No JVD, murmurs, rubs, gallops or clicks. No pedal edema. Gastrointestinal system: Abdomen is obese, nondistended, soft and tender in the lower abdomen slightly in the suprapubic but also under the pannus. No organomegaly or masses felt. Normal bowel sounds heard. Central nervous system: Alert and oriented. No focal neurological deficits. Extremities: Symmetric 5 x 5 power. Able to ambulate without concern Skin: No rashes, lesions or ulcers Psychiatry: Judgement and insight appear normal. Mood & affect appropriate.     Data Reviewed: I have personally reviewed following labs and imaging  studies  CBC:  Recent Labs Lab 05/29/17 1842 05/30/17 0652 05/30/17 1512 05/30/17 2306 05/31/17 0636  WBC 11.2* 7.2 7.8 7.1 6.9  NEUTROABS PENDING  --   --   --  4.4  HGB 11.9* 10.8* 10.6* 10.3* 11.6*  HCT 36.8 33.4* 32.5* 31.2* 35.5*  MCV 98.4 100.0 98.8 98.1 97.3  PLT 299 240 244 236 527   Basic Metabolic Panel:  Recent Labs Lab 05/28/17 1137 05/29/17 1842 05/31/17 0636  NA 138 140 139  K 3.8 4.3 4.1  CL 103 102 106  CO2 24 24 24   GLUCOSE 222* 210* 132*  BUN 13 16 7   CREATININE 1.27* 1.92* 1.25*  CALCIUM 9.7 10.2 9.1   GFR: Estimated Creatinine Clearance: 59.1 mL/min (A) (by C-G formula based on SCr of 1.25 mg/dL (H)). Liver Function Tests:  Recent Labs Lab 05/29/17 1842  AST 26  ALT 25  ALKPHOS 44  BILITOT 0.7  PROT 7.5  ALBUMIN 3.9   No results for input(s): LIPASE, AMYLASE in the last 168 hours. No results for input(s): AMMONIA in the last 168 hours. Coagulation Profile: No results for input(s): INR, PROTIME in the last 168 hours. Cardiac Enzymes: No results for input(s): CKTOTAL, CKMB, CKMBINDEX, TROPONINI in the last 168 hours. BNP (last 3 results) No results for input(s): PROBNP in the last 8760 hours. HbA1C: No results for input(s): HGBA1C in the last 72 hours. CBG:  Recent Labs Lab 05/30/17 2011 05/31/17 0039 05/31/17 0429 05/31/17 0740 05/31/17 1119  GLUCAP 169* 156* 114* 143* 172*   Lipid Profile: No results for input(s): CHOL, HDL, LDLCALC, TRIG, CHOLHDL, LDLDIRECT in the last 72 hours. Thyroid Function Tests:  Recent Labs  05/29/17 2017  TSH 1.875   Anemia Panel: No results for input(s): VITAMINB12, FOLATE, FERRITIN, TIBC, IRON, RETICCTPCT in the last 72 hours. Sepsis Labs:  Recent Labs Lab 05/29/17 1903 05/29/17 1930 05/29/17 2238 05/30/17 0650  LATICACIDVEN 3.8* 4.30* 2.3* 2.8*    Recent Results (from the past 240 hour(s))  Urine culture     Status: Abnormal   Collection Time: 05/29/17  7:25 PM  Result Value  Ref Range Status   Specimen Description URINE, RANDOM  Final   Special Requests NONE  Final   Culture (A)  Final    <10,000 COLONIES/mL INSIGNIFICANT GROWTH Performed at [facility], [place] [address] [place], [place] [address]    Report Status 05/31/2017 FINAL  Final  Blood culture (routine x 2)     Status: None (Preliminary result)   Collection Time: 05/29/17  8:10 PM  Result Value Ref Range Status   Specimen Description BLOOD LEFT ARM  Final  Special Requests   Final    BOTTLES DRAWN AEROBIC AND ANAEROBIC Blood Culture adequate volume   Culture NO GROWTH 2 DAYS  Final   Report Status PENDING  Incomplete  Blood culture (routine x 2)     Status: None (Preliminary result)   Collection Time: 05/29/17  8:17 PM  Result Value Ref Range Status   Specimen Description BLOOD RIGHT ARM  Final   Special Requests   Final    BOTTLES DRAWN AEROBIC AND ANAEROBIC Blood Culture adequate volume   Culture NO GROWTH 2 DAYS  Final   Report Status PENDING  Incomplete         Radiology Studies: Ct Abdomen Pelvis Wo Contrast  Result Date: 05/29/2017 CLINICAL DATA:  61 year old female with suprapubic abdominal pain. EXAM: CT ABDOMEN AND PELVIS WITHOUT CONTRAST TECHNIQUE: Multidetector CT imaging of the abdomen and pelvis was performed following the standard protocol without IV contrast. COMPARISON:  None. FINDINGS: Evaluation of this exam is limited in the absence of intravenous contrast. Lower chest: The visualized lung bases are clear. No intra-abdominal free air or free fluid. Hepatobiliary: Diffuse fatty infiltration of the liver. No intrahepatic biliary ductal dilatation. Cholecystectomy. Pancreas: Unremarkable. No pancreatic ductal dilatation or surrounding inflammatory changes. Spleen: Normal in size without focal abnormality. Adrenals/Urinary Tract: The adrenal glands are unremarkable. There is a large staghorn calculus extending from the renal pelvis into the interpolar and lower pole  collecting system of the right kidney. There is no hydronephrosis. The left kidney is unremarkable. The visualized ureters appear unremarkable as well. The urinary bladder is predominantly collapsed. Stomach/Bowel: A gastric lap band is noted which appears in appropriate orientation. Small scattered colonic diverticula noted without active inflammation. Loose stool noted in the proximal colon. There is no evidence of bowel obstruction or active inflammation. The appendix is not visualized with certainty. No inflammatory changes identified in the right lower quadrant. Vascular/Lymphatic: There is mild aortoiliac atherosclerotic disease. The abdominal aorta and IVC are otherwise unremarkable on this noncontrast study. No portal venous gas identified. There is no adenopathy. Reproductive: There is a 7 x 5 cm calcified uterine fibroids as well as smaller calcified fibroids. The ovaries appear unremarkable. Other: Small fat containing umbilical hernia. Musculoskeletal: Degenerative changes of the spine. No acute osseous pathology. IMPRESSION: 1. Colonic diverticulosis. No evidence of bowel obstruction or active inflammation. 2. Right renal staghorn calculus.  No hydronephrosis. 3. Fatty liver. 4. Calcified uterine fibroid. 5. Atherosclerotic aorta. Electronically Signed   By: Anner Crete M.D.   On: 05/29/2017 22:39   Dg Chest 2 View  Result Date: 05/29/2017 CLINICAL DATA:  Fatigue.  SOB.  Cough EXAM: CHEST  2 VIEW COMPARISON:  05/28/2017 FINDINGS: There is some rotational artifact. This accentuates the cardiac silhouette. No pleural effusions or edema. No airspace opacities. Degenerative changes noted within the spine. IMPRESSION: 1. No acute cardiopulmonary abnormalities. Electronically Signed   By: Kerby Moors M.D.   On: 05/29/2017 21:01        Scheduled Meds: . amLODipine  10 mg Oral QPM  . cholecalciferol  1,000 Units Oral Daily  . gabapentin  300 mg Oral TID  . heparin  7,500 Units  Subcutaneous Q8H  . insulin aspart  0-15 Units Subcutaneous Q4H  . isosorbide mononitrate  30 mg Oral Daily  . pantoprazole (PROTONIX) IV  40 mg Intravenous Q12H   Continuous Infusions: . piperacillin-tazobactam (ZOSYN)  IV 3.375 g (05/31/17 1315)     LOS: 2 days    Time spent: 30  minutes    Loretha Stapler, MD [facility] Pager [phone]  If 7PM-7AM, please contact night-coverage [email or web address] Password [email or web address] 05/31/2017, 4:45 PM

## 2017-06-01 DIAGNOSIS — N10 Acute tubulo-interstitial nephritis: Secondary | ICD-10-CM

## 2017-06-01 LAB — GASTROINTESTINAL PANEL BY PCR, STOOL (REPLACES STOOL CULTURE)

## 2017-06-01 LAB — GLUCOSE, CAPILLARY
Glucose-Capillary: 146 mg/dL — ABNORMAL HIGH (ref 65–99)
Glucose-Capillary: 149 mg/dL — ABNORMAL HIGH (ref 65–99)
Glucose-Capillary: 218 mg/dL — ABNORMAL HIGH (ref 65–99)
Glucose-Capillary: 174 mg/dL — ABNORMAL HIGH (ref 65–99)

## 2017-06-01 MED ORDER — LOSARTAN POTASSIUM 50 MG PO TABS
25.0000 mg | ORAL_TABLET | Freq: Every day | ORAL | Status: DC
  Administered 2017-06-01: 25 mg via ORAL
  Filled 2017-06-01: qty 1

## 2017-06-01 NOTE — Progress Notes (Signed)
Patient ambulating without oxygen had an O2 saturation  Of 94% and a heart rate of 128. Patient was short of breath

## 2017-06-01 NOTE — Progress Notes (Signed)
Patient states understanding of discharge instructions.  

## 2017-06-01 NOTE — Discharge Summary (Signed)
Physician Discharge Summary  Sara Tran CLE:[id number] DOB: 1956-01-14 DOA: 05/29/2017  PCP: Sharilyn Sites, MD  Admit date: 05/29/2017 Discharge date: 06/01/2017  Admitted From: Home  Disposition:  Home   Recommendations for Outpatient Follow-up:  1. Follow up with PCP in 1-2 weeks 2. Please obtain BMP/CBC in one week 3. Please follow up on the following pending results:  Home Health: No Equipment/Devices: None  Discharge Condition: Stable CODE STATUS: Full code Diet recommendation: Heart Healthy / Carb Modified  Brief/Interim Summary: Jenise Iannelli Tuckeris an 61 y.o.femalewith hx of morbid obesity, OSA on CPAP, HTN, hx of lap banding 2011, DM, pulmonary HTN, HLD, CKD, ashtma, presented to the ER with some blood in her vomitus. She was seen for dark stool, but it was dark green rather an melena yesterday. UA at that time showed UTI, and she was given Macrodantin. She has no fever, but has suprapubic tenderness. She has no rigor or chills, and denied epigastric pain. Evaluation in the ER showed normotensive, elevated lactic acid to 3.8, WBC of 11K, and Cr of 1.9. Her UA and CT of the abdomen is still pending. She was given IV Zosyn and IVF, and hospitalist was asked to admit her for possible sepsis due to UTI, volume depletion with AKI on CKD, and possible slight hematemesis. Her Hb is stable at 11.9 g per dL. Patient was started on IV Zosyn and urology was consulted.  Urine and blood cultures were both negative at time of discharge and antibiotics were stopped.  Her diarrhea was tested and GI pathogen panel was negative.  She was seen by urology who discussed following up with her outpatient for stone removal.  She was ambulated without oxygen to see if she qualified for oxygen prior to discharge.  Her oxygenation level did not decrease below 94%.    Discharge Diagnoses:  Principal Problem:   Sepsis due to urinary tract infection ([place]) Active Problems:   Morbid obesity (HCC)    Diabetes mellitus (HCC)   IBS (irritable bowel syndrome)-[place]   Acute on chronic renal insufficiency   GI bleed   UTI (urinary tract infection)    Discharge Instructions  Discharge Instructions    Call MD for:  difficulty breathing, headache or visual disturbances    Complete by:  As directed    Call MD for:  extreme fatigue    Complete by:  As directed    Call MD for:  hives    Complete by:  As directed    Call MD for:  persistant dizziness or light-headedness    Complete by:  As directed    Call MD for:  persistant nausea and vomiting    Complete by:  As directed    Call MD for:  severe uncontrolled pain    Complete by:  As directed    Call MD for:  temperature >100.4    Complete by:  As directed    Diet - low sodium heart healthy    Complete by:  As directed    Discharge instructions    Complete by:  As directed    Urology will call you to set up an appointment See your PCP within 1-2 weeks   Increase activity slowly    Complete by:  As directed      Allergies as of 06/01/2017      Reactions   Aspirin Other (See Comments)   Rectal bleeding   Bactrim [sulfamethoxazole-trimethoprim] Other (See Comments)   Cramping,bloating,vomiting,fever,headaches   Bee Venom Swelling  As a child   Ceftin [cefuroxime Axetil] Swelling   Swollen tongue, "made me crazy"   Ciprofloxacin Cough   Coughed up blood   Milk-related Compounds Other (See Comments)   IBS   Latex Itching, Rash      Medication List    STOP taking these medications   nitrofurantoin (macrocrystal-monohydrate) 100 MG capsule Commonly known as:  MACROBID   predniSONE 20 MG tablet Commonly known as:  DELTASONE     TAKE these medications   albuterol 108 (90 Base) MCG/ACT inhaler Commonly known as:  PROVENTIL HFA;VENTOLIN HFA Inhale 2 puffs into the lungs every 6 (six) hours as needed. FOR SHORTNESS OF BREATH/WHEEZING   albuterol (2.5 MG/3ML) 0.083% nebulizer solution Commonly known as:   PROVENTIL Take 2.5 mg by nebulization every 4 (four) hours as needed for wheezing or shortness of breath.   ALPRAZolam 0.5 MG tablet Commonly known as:  XANAX Take 0.5 mg by mouth 3 (three) times daily as needed. For anxiety/sleep   amLODipine 10 MG tablet Commonly known as:  NORVASC Take 1 tablet by mouth every evening.   carisoprodol 350 MG tablet Commonly known as:  SOMA TAKE 1 TABLET 4 TIMES A DAY AS NEEDED FOR MUSCLE SPASM.   fenofibrate 160 MG tablet Take 160 mg by mouth daily.   fluticasone 50 MCG/ACT nasal spray Commonly known as:  FLONASE Place 1-2 sprays into the nose daily.   gabapentin 300 MG capsule Commonly known as:  NEURONTIN Take 300 mg by mouth 3 (three) times daily.   ipratropium 0.02 % nebulizer solution Commonly known as:  ATROVENT Take 0.5 mg by nebulization every 6 (six) hours as needed.   isosorbide mononitrate 30 MG 24 hr tablet Commonly known as:  IMDUR Take 30 mg by mouth every morning.   loratadine 10 MG tablet Commonly known as:  CLARITIN Take 1 tablet by mouth daily.   losartan 100 MG tablet Commonly known as:  COZAAR Take 100 mg by mouth every morning.   magnesium oxide 400 MG tablet Commonly known as:  MAG-OX Take 400 mg by mouth daily.   metFORMIN 500 MG 24 hr tablet Commonly known as:  GLUCOPHAGE-XR Take 1 tablet by mouth daily.   nitroGLYCERIN 0.4 MG SL tablet Commonly known as:  NITROSTAT Place 0.4 mg under the tongue every 5 (five) minutes as needed. Reported on 06/14/2016   ONE-A-DAY 50 PLUS PO Take 1 tablet by mouth daily.   potassium chloride SA 20 MEQ tablet Commonly known as:  K-DUR,KLOR-CON Take 20 mEq by mouth every morning.   tiZANidine 4 MG tablet Commonly known as:  ZANAFLEX Take 4 mg by mouth every 8 (eight) hours as needed for muscle spasms.   VITAMIN D3 PO Take 1 capsule by mouth daily.      Follow-up Information    Irine Seal, MD Follow up.   Specialty:  Urology Why:  The office will call to  arrange f/u for the next 2-3 weeks.  Contact information: [address] [place] [address] [phone]        Sharilyn Sites, MD. Schedule an appointment as soon as possible for a visit in 1 week(s).   Specialty:  Family Medicine Contact information: [address] [place] [address] [phone]          Allergies  Allergen Reactions  . Aspirin Other (See Comments)    Rectal bleeding  . Bactrim [Sulfamethoxazole-Trimethoprim] Other (See Comments)    Cramping,bloating,vomiting,fever,headaches  . Bee Venom Swelling  As a child  . Ceftin [Cefuroxime Axetil] Swelling    Swollen tongue, "made me crazy"  . Ciprofloxacin Cough    Coughed up blood  . Milk-Related Compounds Other (See Comments)    IBS  . Latex Itching and Rash    Consultations:  Urology  PT   Procedures/Studies: Ct Abdomen Pelvis Wo Contrast  Result Date: 05/29/2017 CLINICAL DATA:  61 year old female with suprapubic abdominal pain. EXAM: CT ABDOMEN AND PELVIS WITHOUT CONTRAST TECHNIQUE: Multidetector CT imaging of the abdomen and pelvis was performed following the standard protocol without IV contrast. COMPARISON:  None. FINDINGS: Evaluation of this exam is limited in the absence of intravenous contrast. Lower chest: The visualized lung bases are clear. No intra-abdominal free air or free fluid. Hepatobiliary: Diffuse fatty infiltration of the liver. No intrahepatic biliary ductal dilatation. Cholecystectomy. Pancreas: Unremarkable. No pancreatic ductal dilatation or surrounding inflammatory changes. Spleen: Normal in size without focal abnormality. Adrenals/Urinary Tract: The adrenal glands are unremarkable. There is a large staghorn calculus extending from the renal pelvis into the interpolar and lower pole collecting system of the right kidney. There is no hydronephrosis. The left kidney is unremarkable. The visualized ureters appear unremarkable as well. The urinary bladder is  predominantly collapsed. Stomach/Bowel: A gastric lap band is noted which appears in appropriate orientation. Small scattered colonic diverticula noted without active inflammation. Loose stool noted in the proximal colon. There is no evidence of bowel obstruction or active inflammation. The appendix is not visualized with certainty. No inflammatory changes identified in the right lower quadrant. Vascular/Lymphatic: There is mild aortoiliac atherosclerotic disease. The abdominal aorta and IVC are otherwise unremarkable on this noncontrast study. No portal venous gas identified. There is no adenopathy. Reproductive: There is a 7 x 5 cm calcified uterine fibroids as well as smaller calcified fibroids. The ovaries appear unremarkable. Other: Small fat containing umbilical hernia. Musculoskeletal: Degenerative changes of the spine. No acute osseous pathology. IMPRESSION: 1. Colonic diverticulosis. No evidence of bowel obstruction or active inflammation. 2. Right renal staghorn calculus.  No hydronephrosis. 3. Fatty liver. 4. Calcified uterine fibroid. 5. Atherosclerotic aorta. Electronically Signed   By: Anner Crete M.D.   On: 05/29/2017 22:39   Dg Chest 2 View  Result Date: 05/29/2017 CLINICAL DATA:  Fatigue.  SOB.  Cough EXAM: CHEST  2 VIEW COMPARISON:  05/28/2017 FINDINGS: There is some rotational artifact. This accentuates the cardiac silhouette. No pleural effusions or edema. No airspace opacities. Degenerative changes noted within the spine. IMPRESSION: 1. No acute cardiopulmonary abnormalities. Electronically Signed   By: Kerby Moors M.D.   On: 05/29/2017 21:01   Dg Chest 2 View  Result Date: 05/28/2017 CLINICAL DATA:  Chest pain for 2 weeks EXAM: CHEST  2 VIEW COMPARISON:  February 01, 2017 FINDINGS: Lungs are clear. Heart size and pulmonary vascularity are normal. No pneumothorax. No adenopathy. There is mild degenerative change in the thoracic spine. There is a lap band at the gastric cardia  level. IMPRESSION: No edema or consolidation. Electronically Signed   By: Lowella Grip III M.D.   On: 05/28/2017 12:21      Subjective: Patient very upset that she did not qualify for oxygen. Says she has been trying to qualify for oxygen for quite some time and just wants to have it at home if she needs it.  Says she has decided she will not be opting to get nephrostomy drainage for her stone.    Discharge Exam: Vitals:   06/01/17 0629 06/01/17 1543  BP: Marland Kitchen)  142/65 (!) 105/47  Pulse: 94 (!) 101  Resp: 18 18  Temp: 98.1 F (36.7 C)    Vitals:   05/31/17 2151 06/01/17 0629 06/01/17 0812 06/01/17 1543  BP:  (!) 142/65  (!) 105/47  Pulse: 98 94  (!) 101  Resp: 20 18  18   Temp:  98.1 F (36.7 C)    TempSrc:  Oral    SpO2: 98% 97% 99% 98%  Weight:      Height:        General: Pt is alert, awake, not in acute distress Cardiovascular: RRR, S1/S2 +, no rubs, no gallops Respiratory: CTA bilaterally, no wheezing, no rhonchi Abdominal: Soft, obese, NT, ND, bowel sounds + Extremities: no edema, no cyanosis    The results of significant diagnostics from this hospitalization (including imaging, microbiology, ancillary and laboratory) are listed below for reference.     Microbiology: Recent Results (from the past 240 hour(s))  Urine culture     Status: Abnormal   Collection Time: 05/29/17  7:25 PM  Result Value Ref Range Status   Specimen Description URINE, RANDOM  Final   Special Requests NONE  Final   Culture (A)  Final    <10,000 COLONIES/mL INSIGNIFICANT GROWTH Performed at [facility], [address] [place], [place] [address]    Report Status 05/31/2017 FINAL  Final  Blood culture (routine x 2)     Status: None (Preliminary result)   Collection Time: 05/29/17  8:10 PM  Result Value Ref Range Status   Specimen Description BLOOD LEFT ARM  Final   Special Requests   Final    BOTTLES DRAWN AEROBIC AND ANAEROBIC Blood Culture adequate volume   Culture NO  GROWTH 3 DAYS  Final   Report Status PENDING  Incomplete  Blood culture (routine x 2)     Status: None (Preliminary result)   Collection Time: 05/29/17  8:17 PM  Result Value Ref Range Status   Specimen Description BLOOD RIGHT ARM  Final   Special Requests   Final    BOTTLES DRAWN AEROBIC AND ANAEROBIC Blood Culture adequate volume   Culture NO GROWTH 3 DAYS  Final   Report Status PENDING  Incomplete  Gastrointestinal Panel by PCR , Stool     Status: None   Collection Time: 05/30/17  4:44 PM  Result Value Ref Range Status   Campylobacter species NOT DETECTED NOT DETECTED Final   Plesimonas shigelloides NOT DETECTED NOT DETECTED Final   Salmonella species NOT DETECTED NOT DETECTED Final   Yersinia enterocolitica NOT DETECTED NOT DETECTED Final   Vibrio species NOT DETECTED NOT DETECTED Final   Vibrio cholerae NOT DETECTED NOT DETECTED Final   Enteroaggregative E coli (EAEC) NOT DETECTED NOT DETECTED Final   Enteropathogenic E coli (EPEC) NOT DETECTED NOT DETECTED Final   Enterotoxigenic E coli (ETEC) NOT DETECTED NOT DETECTED Final   Shiga like toxin producing E coli (STEC) NOT DETECTED NOT DETECTED Final   Shigella/Enteroinvasive E coli (EIEC) NOT DETECTED NOT DETECTED Final   Cryptosporidium NOT DETECTED NOT DETECTED Final   Cyclospora cayetanensis NOT DETECTED NOT DETECTED Final   Entamoeba histolytica NOT DETECTED NOT DETECTED Final   Giardia lamblia NOT DETECTED NOT DETECTED Final   Adenovirus F40/41 NOT DETECTED NOT DETECTED Final   Astrovirus NOT DETECTED NOT DETECTED Final   Norovirus GI/GII NOT DETECTED NOT DETECTED Final   Rotavirus A NOT DETECTED NOT DETECTED Final   Sapovirus (I, II, IV, and V) NOT DETECTED NOT DETECTED Final  Labs: BNP (last 3 results) No results for input(s): BNP in the last 8760 hours. Basic Metabolic Panel:  Recent Labs Lab 05/28/17 1137 05/29/17 1842 05/31/17 0636  NA 138 140 139  K 3.8 4.3 4.1  CL 103 102 106  CO2 24 24 24   GLUCOSE  222* 210* 132*  BUN 13 16 7   CREATININE 1.27* 1.92* 1.25*  CALCIUM 9.7 10.2 9.1   Liver Function Tests:  Recent Labs Lab 05/29/17 1842  AST 26  ALT 25  ALKPHOS 44  BILITOT 0.7  PROT 7.5  ALBUMIN 3.9   No results for input(s): LIPASE, AMYLASE in the last 168 hours. No results for input(s): AMMONIA in the last 168 hours. CBC:  Recent Labs Lab 05/29/17 1842 05/30/17 0652 05/30/17 1512 05/30/17 2306 05/31/17 0636  WBC 11.2* 7.2 7.8 7.1 6.9  NEUTROABS PENDING  --   --   --  4.4  HGB 11.9* 10.8* 10.6* 10.3* 11.6*  HCT 36.8 33.4* 32.5* 31.2* 35.5*  MCV 98.4 100.0 98.8 98.1 97.3  PLT 299 240 244 236 284   Cardiac Enzymes: No results for input(s): CKTOTAL, CKMB, CKMBINDEX, TROPONINI in the last 168 hours. BNP: Invalid input(s): POCBNP CBG:  Recent Labs Lab 05/31/17 1119 05/31/17 2102 06/01/17 0308 06/01/17 0800 06/01/17 1140  GLUCAP 172* 186* 146* 149* 218*   D-Dimer No results for input(s): DDIMER in the last 72 hours. Hgb A1c No results for input(s): HGBA1C in the last 72 hours. Lipid Profile No results for input(s): CHOL, HDL, LDLCALC, TRIG, CHOLHDL, LDLDIRECT in the last 72 hours. Thyroid function studies  Recent Labs  05/29/17 2017  TSH 1.875   Anemia work up No results for input(s): VITAMINB12, FOLATE, FERRITIN, TIBC, IRON, RETICCTPCT in the last 72 hours. Urinalysis    Component Value Date/Time   COLORURINE YELLOW 05/29/2017 1909   APPEARANCEUR CLOUDY (A) 05/29/2017 1909   LABSPEC 1.014 05/29/2017 1909   PHURINE 5.0 05/29/2017 1909   GLUCOSEU NEGATIVE 05/29/2017 1909   HGBUR LARGE (A) 05/29/2017 [place] NEGATIVE 05/29/2017 [place] NEGATIVE 05/29/2017 1909   PROTEINUR 30 (A) 05/29/2017 1909   UROBILINOGEN 0.2 09/09/2013 1911   NITRITE NEGATIVE 05/29/2017 1909   LEUKOCYTESUR LARGE (A) 05/29/2017 1909   Sepsis Labs Invalid input(s): PROCALCITONIN,  WBC,  LACTICIDVEN Microbiology Recent Results (from the past 240  hour(s))  Urine culture     Status: Abnormal   Collection Time: 05/29/17  7:25 PM  Result Value Ref Range Status   Specimen Description URINE, RANDOM  Final   Special Requests NONE  Final   Culture (A)  Final    <10,000 COLONIES/mL INSIGNIFICANT GROWTH Performed at [facility], [address], [place], [place] [address]    Report Status 05/31/2017 FINAL  Final  Blood culture (routine x 2)     Status: None (Preliminary result)   Collection Time: 05/29/17  8:10 PM  Result Value Ref Range Status   Specimen Description BLOOD LEFT ARM  Final   Special Requests   Final    BOTTLES DRAWN AEROBIC AND ANAEROBIC Blood Culture adequate volume   Culture NO GROWTH 3 DAYS  Final   Report Status PENDING  Incomplete  Blood culture (routine x 2)     Status: None (Preliminary result)   Collection Time: 05/29/17  8:17 PM  Result Value Ref Range Status   Specimen Description BLOOD RIGHT ARM  Final   Special Requests   Final    BOTTLES DRAWN AEROBIC AND  ANAEROBIC Blood Culture adequate volume   Culture NO GROWTH 3 DAYS  Final   Report Status PENDING  Incomplete  Gastrointestinal Panel by PCR , Stool     Status: None   Collection Time: 05/30/17  4:44 PM  Result Value Ref Range Status   Campylobacter species NOT DETECTED NOT DETECTED Final   Plesimonas shigelloides NOT DETECTED NOT DETECTED Final   Salmonella species NOT DETECTED NOT DETECTED Final   Yersinia enterocolitica NOT DETECTED NOT DETECTED Final   Vibrio species NOT DETECTED NOT DETECTED Final   Vibrio cholerae NOT DETECTED NOT DETECTED Final   Enteroaggregative E coli (EAEC) NOT DETECTED NOT DETECTED Final   Enteropathogenic E coli (EPEC) NOT DETECTED NOT DETECTED Final   Enterotoxigenic E coli (ETEC) NOT DETECTED NOT DETECTED Final   Shiga like toxin producing E coli (STEC) NOT DETECTED NOT DETECTED Final   Shigella/Enteroinvasive E coli (EIEC) NOT DETECTED NOT DETECTED Final   Cryptosporidium NOT DETECTED NOT DETECTED Final    Cyclospora cayetanensis NOT DETECTED NOT DETECTED Final   Entamoeba histolytica NOT DETECTED NOT DETECTED Final   Giardia lamblia NOT DETECTED NOT DETECTED Final   Adenovirus F40/41 NOT DETECTED NOT DETECTED Final   Astrovirus NOT DETECTED NOT DETECTED Final   Norovirus GI/GII NOT DETECTED NOT DETECTED Final   Rotavirus A NOT DETECTED NOT DETECTED Final   Sapovirus (I, II, IV, and V) NOT DETECTED NOT DETECTED Final     Time coordinating discharge: 45 minutes  SIGNED:   Loretha Stapler, MD  [facility] 06/01/2017, 4:00 PM Pager [phone] If 7PM-7AM, please contact night-coverage [email or web address] Password [email or web address]

## 2017-06-03 LAB — GLUCOSE, CAPILLARY
Glucose-Capillary: 153 mg/dL — ABNORMAL HIGH (ref 65–99)
Glucose-Capillary: 162 mg/dL — ABNORMAL HIGH (ref 65–99)

## 2017-06-03 LAB — CULTURE, BLOOD (ROUTINE X 2)
Culture: NO GROWTH
Culture: NO GROWTH
Special Requests: ADEQUATE
Special Requests: ADEQUATE

## 2017-06-14 DIAGNOSIS — Z6841 Body Mass Index (BMI) 40.0 and over, adult: Secondary | ICD-10-CM | POA: Diagnosis not present

## 2017-06-14 DIAGNOSIS — N289 Disorder of kidney and ureter, unspecified: Secondary | ICD-10-CM | POA: Diagnosis not present

## 2017-06-14 DIAGNOSIS — Z1389 Encounter for screening for other disorder: Secondary | ICD-10-CM | POA: Diagnosis not present

## 2017-06-14 DIAGNOSIS — A419 Sepsis, unspecified organism: Secondary | ICD-10-CM | POA: Diagnosis not present

## 2017-06-18 ENCOUNTER — Encounter: Payer: Medicare HMO | Attending: Physical Medicine & Rehabilitation | Admitting: Physical Medicine & Rehabilitation

## 2017-06-18 ENCOUNTER — Encounter: Payer: Self-pay | Admitting: Physical Medicine & Rehabilitation

## 2017-06-18 VITALS — BP 157/76 | HR 114 | Resp 14

## 2017-06-18 DIAGNOSIS — Z803 Family history of malignant neoplasm of breast: Secondary | ICD-10-CM | POA: Insufficient documentation

## 2017-06-18 DIAGNOSIS — M4696 Unspecified inflammatory spondylopathy, lumbar region: Secondary | ICD-10-CM | POA: Diagnosis not present

## 2017-06-18 DIAGNOSIS — G8929 Other chronic pain: Secondary | ICD-10-CM | POA: Diagnosis not present

## 2017-06-18 DIAGNOSIS — Z87891 Personal history of nicotine dependence: Secondary | ICD-10-CM | POA: Diagnosis not present

## 2017-06-18 DIAGNOSIS — N390 Urinary tract infection, site not specified: Secondary | ICD-10-CM

## 2017-06-18 DIAGNOSIS — M4316 Spondylolisthesis, lumbar region: Secondary | ICD-10-CM | POA: Insufficient documentation

## 2017-06-18 DIAGNOSIS — G4730 Sleep apnea, unspecified: Secondary | ICD-10-CM | POA: Diagnosis not present

## 2017-06-18 DIAGNOSIS — Z9884 Bariatric surgery status: Secondary | ICD-10-CM | POA: Diagnosis not present

## 2017-06-18 DIAGNOSIS — R42 Dizziness and giddiness: Secondary | ICD-10-CM | POA: Diagnosis not present

## 2017-06-18 DIAGNOSIS — M4726 Other spondylosis with radiculopathy, lumbar region: Secondary | ICD-10-CM | POA: Diagnosis not present

## 2017-06-18 DIAGNOSIS — I272 Pulmonary hypertension, unspecified: Secondary | ICD-10-CM | POA: Insufficient documentation

## 2017-06-18 DIAGNOSIS — Z8249 Family history of ischemic heart disease and other diseases of the circulatory system: Secondary | ICD-10-CM | POA: Insufficient documentation

## 2017-06-18 DIAGNOSIS — E119 Type 2 diabetes mellitus without complications: Secondary | ICD-10-CM | POA: Insufficient documentation

## 2017-06-18 DIAGNOSIS — Z9889 Other specified postprocedural states: Secondary | ICD-10-CM | POA: Diagnosis not present

## 2017-06-18 DIAGNOSIS — J449 Chronic obstructive pulmonary disease, unspecified: Secondary | ICD-10-CM | POA: Insufficient documentation

## 2017-06-18 DIAGNOSIS — M542 Cervicalgia: Secondary | ICD-10-CM | POA: Insufficient documentation

## 2017-06-18 DIAGNOSIS — R200 Anesthesia of skin: Secondary | ICD-10-CM | POA: Insufficient documentation

## 2017-06-18 DIAGNOSIS — F419 Anxiety disorder, unspecified: Secondary | ICD-10-CM | POA: Insufficient documentation

## 2017-06-18 DIAGNOSIS — R531 Weakness: Secondary | ICD-10-CM | POA: Insufficient documentation

## 2017-06-18 DIAGNOSIS — K648 Other hemorrhoids: Secondary | ICD-10-CM | POA: Diagnosis not present

## 2017-06-18 DIAGNOSIS — M7062 Trochanteric bursitis, left hip: Secondary | ICD-10-CM

## 2017-06-18 DIAGNOSIS — A499 Bacterial infection, unspecified: Secondary | ICD-10-CM

## 2017-06-18 DIAGNOSIS — Z823 Family history of stroke: Secondary | ICD-10-CM | POA: Insufficient documentation

## 2017-06-18 DIAGNOSIS — K589 Irritable bowel syndrome without diarrhea: Secondary | ICD-10-CM | POA: Diagnosis not present

## 2017-06-18 DIAGNOSIS — E781 Pure hyperglyceridemia: Secondary | ICD-10-CM | POA: Diagnosis not present

## 2017-06-18 DIAGNOSIS — M7630 Iliotibial band syndrome, unspecified leg: Secondary | ICD-10-CM | POA: Insufficient documentation

## 2017-06-18 DIAGNOSIS — M7061 Trochanteric bursitis, right hip: Secondary | ICD-10-CM | POA: Diagnosis not present

## 2017-06-18 DIAGNOSIS — M5416 Radiculopathy, lumbar region: Secondary | ICD-10-CM

## 2017-06-18 DIAGNOSIS — R300 Dysuria: Secondary | ICD-10-CM | POA: Diagnosis not present

## 2017-06-18 DIAGNOSIS — Z833 Family history of diabetes mellitus: Secondary | ICD-10-CM | POA: Diagnosis not present

## 2017-06-18 DIAGNOSIS — Z87440 Personal history of urinary (tract) infections: Secondary | ICD-10-CM | POA: Diagnosis not present

## 2017-06-18 DIAGNOSIS — G894 Chronic pain syndrome: Secondary | ICD-10-CM | POA: Diagnosis not present

## 2017-06-18 DIAGNOSIS — M47816 Spondylosis without myelopathy or radiculopathy, lumbar region: Secondary | ICD-10-CM
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
## 2017-06-18 NOTE — Patient Instructions (Addendum)
CONTINUE TO MAINTAIN ACTIVITY TO TOLERANCE.    PLEASE FEEL FREE TO CALL OUR OFFICE WITH ANY PROBLEMS OR QUESTIONS ([phone])

## 2017-06-19 DIAGNOSIS — E1165 Type 2 diabetes mellitus with hyperglycemia: Secondary | ICD-10-CM | POA: Diagnosis not present

## 2017-06-19 DIAGNOSIS — E669 Obesity, unspecified: Secondary | ICD-10-CM | POA: Diagnosis not present

## 2017-06-19 DIAGNOSIS — J441 Chronic obstructive pulmonary disease with (acute) exacerbation: Secondary | ICD-10-CM | POA: Diagnosis not present

## 2017-06-19 DIAGNOSIS — G4733 Obstructive sleep apnea (adult) (pediatric): Secondary | ICD-10-CM | POA: Diagnosis not present

## 2017-06-19 LAB — URINALYSIS
Bilirubin, UA: NEGATIVE
Glucose, UA: NEGATIVE
Ketones, UA: NEGATIVE
Nitrite, UA: NEGATIVE
Specific Gravity, UA: 1.013 (ref 1.005–1.030)
Urobilinogen, Ur: 0.2 mg/dL (ref 0.2–1.0)
pH, UA: 6 (ref 5.0–7.5)

## 2017-06-20 LAB — URINE CULTURE

## 2017-06-21 DIAGNOSIS — G4710 Hypersomnia, unspecified: Secondary | ICD-10-CM | POA: Diagnosis not present

## 2017-06-21 DIAGNOSIS — G4733 Obstructive sleep apnea (adult) (pediatric): Secondary | ICD-10-CM | POA: Diagnosis not present

## 2017-06-21 DIAGNOSIS — M159 Polyosteoarthritis, unspecified: Secondary | ICD-10-CM | POA: Diagnosis not present

## 2017-07-19 ENCOUNTER — Other Ambulatory Visit (HOSPITAL_COMMUNITY)
Admission: RE | Admit: 2017-07-19 | Discharge: 2017-07-19 | Disposition: A | Discharge status: Home / Self Care | Payer: Medicare HMO | Source: Ambulatory Visit | Attending: Urology | Admitting: Urology

## 2017-07-19 ENCOUNTER — Ambulatory Visit (INDEPENDENT_AMBULATORY_CARE_PROVIDER_SITE_OTHER): Payer: Medicare HMO | Admitting: Urology

## 2017-07-19 ENCOUNTER — Encounter (HOSPITAL_COMMUNITY): Payer: Self-pay

## 2017-07-19 DIAGNOSIS — N390 Urinary tract infection, site not specified: Secondary | ICD-10-CM | POA: Diagnosis not present

## 2017-07-19 DIAGNOSIS — N200 Calculus of kidney: Secondary | ICD-10-CM | POA: Insufficient documentation

## 2017-07-19 LAB — URINALYSIS, COMPLETE (UACMP) WITH MICROSCOPIC
Bilirubin Urine: NEGATIVE
Glucose, UA: NEGATIVE mg/dL
Ketones, ur: NEGATIVE mg/dL
Nitrite: NEGATIVE
Protein, ur: NEGATIVE mg/dL
Specific Gravity, Urine: 1.014 (ref 1.005–1.030)
pH: 6 (ref 5.0–8.0)

## 2017-07-21 DIAGNOSIS — G4733 Obstructive sleep apnea (adult) (pediatric): Secondary | ICD-10-CM | POA: Diagnosis not present

## 2017-07-21 DIAGNOSIS — M159 Polyosteoarthritis, unspecified: Secondary | ICD-10-CM | POA: Diagnosis not present

## 2017-07-21 DIAGNOSIS — G4710 Hypersomnia, unspecified: Secondary | ICD-10-CM | POA: Diagnosis not present

## 2017-07-21 LAB — URINE CULTURE: Culture: 80000 — AB

## 2017-07-22 ENCOUNTER — Other Ambulatory Visit: Payer: Self-pay | Admitting: Urology

## 2017-07-22 NOTE — Patient Instructions (Addendum)
Sara Tran  07/22/2017   Your procedure is scheduled on: 08/01/2017    Report to [facility] Main  Entrance Take [place]  elevators to 3rd floor to  [place] at    0700 AM.    Call this number if you have problems the morning of surgery [phone]    Remember: ONLY 1 PERSON MAY GO WITH YOU TO SHORT STAY TO GET  READY MORNING OF YOUR SURGERY.  Do not eat food or drink liquids :After Midnight.     Take these medicines the morning of surgery with A SIP OF WATER: inhalers as usual and bring, Xanax if needed, Flonase, Gabapentin, Nebulizer if needed, imdur, Claritin  DO NOT TAKE ANY DIABETIC MEDICATIONS DAY OF YOUR SURGERY                               You may not have any metal on your body including hair pins and              piercings  Do not wear jewelry, make-up, lotions, powders or perfumes, deodorant             Do not wear nail polish.  Do not shave  48 hours prior to surgery.                 Do not bring valuables to the hospital. [place].  Contacts, dentures or bridgework may not be worn into surgery.      Patients discharged the day of surgery will not be allowed to drive home.  Name and phone number of your driver:  Special Instructions: N/A              Please read over the following fact sheets you were given: _____________________________________________________________________             [facility] - Preparing for Surgery Before surgery, you can play an important role.  Because skin is not sterile, your skin needs to be as free of germs as possible.  You can reduce the number of germs on your skin by washing with CHG (chlorahexidine gluconate) soap before surgery.  CHG is an antiseptic cleaner which kills germs and bonds with the skin to continue killing germs even after washing. Please DO NOT use if you have an allergy to CHG or antibacterial soaps.  If your skin becomes  reddened/irritated stop using the CHG and inform your nurse when you arrive at Short Stay. Do not shave (including legs and underarms) for at least 48 hours prior to the first CHG shower.  You may shave your face/neck. Please follow these instructions carefully:  1.  Shower with CHG Soap the night before surgery and the  morning of Surgery.  2.  If you choose to wash your hair, wash your hair first as usual with your  normal  shampoo.  3.  After you shampoo, rinse your hair and body thoroughly to remove the  shampoo.                           4.  Use CHG as you would any other liquid soap.  You can apply chg directly  to the skin  and wash                       Gently with a scrungie or clean washcloth.  5.  Apply the CHG Soap to your body ONLY FROM THE NECK DOWN.   Do not use on face/ open                           Wound or open sores. Avoid contact with eyes, ears mouth and genitals (private parts).                       Wash face,  Genitals (private parts) with your normal soap.             6.  Wash thoroughly, paying special attention to the area where your surgery  will be performed.  7.  Thoroughly rinse your body with warm water from the neck down.  8.  DO NOT shower/wash with your normal soap after using and rinsing off  the CHG Soap.                9.  Pat yourself dry with a clean towel.            10.  Wear clean pajamas.            11.  Place clean sheets on your bed the night of your first shower and do not  sleep with pets. Day of Surgery : Do not apply any lotions/deodorants the morning of surgery.  Please wear clean clothes to the hospital/surgery center.  FAILURE TO FOLLOW THESE INSTRUCTIONS MAY RESULT IN THE CANCELLATION OF YOUR SURGERY PATIENT SIGNATURE_________________________________  NURSE SIGNATURE__________________________________  ________________________________________________________________________

## 2017-07-23 ENCOUNTER — Encounter (HOSPITAL_COMMUNITY)
Admission: RE | Admit: 2017-07-23 | Discharge: 2017-07-23 | Disposition: A | Discharge status: Home / Self Care | Payer: Medicare HMO | Source: Ambulatory Visit | Attending: Urology | Admitting: Urology

## 2017-07-23 ENCOUNTER — Encounter (HOSPITAL_COMMUNITY): Payer: Self-pay

## 2017-07-23 DIAGNOSIS — Z7951 Long term (current) use of inhaled steroids: Secondary | ICD-10-CM | POA: Insufficient documentation

## 2017-07-23 DIAGNOSIS — N200 Calculus of kidney: Secondary | ICD-10-CM | POA: Insufficient documentation

## 2017-07-23 DIAGNOSIS — Z01810 Encounter for preprocedural cardiovascular examination: Secondary | ICD-10-CM | POA: Diagnosis not present

## 2017-07-23 DIAGNOSIS — Z79899 Other long term (current) drug therapy: Secondary | ICD-10-CM | POA: Insufficient documentation

## 2017-07-23 DIAGNOSIS — Z01812 Encounter for preprocedural laboratory examination: Secondary | ICD-10-CM | POA: Diagnosis not present

## 2017-07-23 DIAGNOSIS — Z7984 Long term (current) use of oral hypoglycemic drugs: Secondary | ICD-10-CM | POA: Diagnosis not present

## 2017-07-23 HISTORY — DX: Personal history of urinary calculi: Z87.442

## 2017-07-23 HISTORY — DX: Unspecified atrial fibrillation: I48.91

## 2017-07-23 HISTORY — DX: Headache, unspecified: R51.9

## 2017-07-23 HISTORY — DX: Gastro-esophageal reflux disease without esophagitis: K21.9

## 2017-07-23 HISTORY — DX: Cardiac murmur, unspecified: R01.1

## 2017-07-23 HISTORY — DX: Headache: R51

## 2017-07-23 HISTORY — DX: Anxiety disorder, unspecified: F41.9

## 2017-07-23 LAB — BASIC METABOLIC PANEL
Anion gap: 8 (ref 5–15)
BUN: 19 mg/dL (ref 6–20)
CO2: 27 mmol/L (ref 22–32)
Calcium: 9.9 mg/dL (ref 8.9–10.3)
Chloride: 106 mmol/L (ref 101–111)
Creatinine, Ser: 1.26 mg/dL — ABNORMAL HIGH (ref 0.44–1.00)
GFR calc Af Amer: 53 mL/min — ABNORMAL LOW (ref >60)
GFR calc non Af Amer: 46 mL/min — ABNORMAL LOW (ref >60)
Glucose, Bld: 120 mg/dL — ABNORMAL HIGH (ref 65–99)
Potassium: 4.2 mmol/L (ref 3.5–5.1)
Sodium: 141 mmol/L (ref 135–145)

## 2017-07-23 LAB — CBC
HCT: 37 % (ref 36.0–46.0)
Hemoglobin: 12.3 g/dL (ref 12.0–15.0)
MCH: 31.5 pg (ref 26.0–34.0)
MCHC: 33.2 g/dL (ref 30.0–36.0)
MCV: 94.9 fL (ref 78.0–100.0)
Platelets: 334 10*3/uL (ref 150–400)
RBC: 3.9 MIL/uL (ref 3.87–5.11)
RDW: 13.3 % (ref 11.5–15.5)
WBC: 10.6 10*3/uL — ABNORMAL HIGH (ref 4.0–10.5)

## 2017-07-23 LAB — GLUCOSE, CAPILLARY: Glucose-Capillary: 124 mg/dL — ABNORMAL HIGH (ref 65–99)

## 2017-07-23 NOTE — Progress Notes (Signed)
[id number]  EKG-05/29/17-epic  CXR-05/29/17-epic

## 2017-07-24 LAB — HEMOGLOBIN A1C
Hgb A1c MFr Bld: 6.8 % — ABNORMAL HIGH (ref 4.8–5.6)
Mean Plasma Glucose: 148 mg/dL

## 2017-07-24 NOTE — Progress Notes (Signed)
Final EKG done 07/23/17-epic

## 2017-07-25 ENCOUNTER — Ambulatory Visit: Payer: Medicare HMO | Admitting: Endocrinology

## 2017-07-29 ENCOUNTER — Telehealth: Payer: Self-pay | Admitting: *Deleted

## 2017-07-29 DIAGNOSIS — G894 Chronic pain syndrome: Secondary | ICD-10-CM | POA: Diagnosis not present

## 2017-07-29 DIAGNOSIS — Z6841 Body Mass Index (BMI) 40.0 and over, adult: Secondary | ICD-10-CM | POA: Diagnosis not present

## 2017-07-29 NOTE — Telephone Encounter (Signed)
Sara Tran called to report that she had a break in and that she had a bottle of gabapentin that she thinks had refills on it and she did not refill these. Just an FYI call. ( I dont see that we have ordered gabapentin)

## 2017-07-31 MED ORDER — GENTAMICIN SULFATE 40 MG/ML IJ SOLN
400.0000 mg | INTRAVENOUS | Status: AC
  Administered 2017-08-01: 400 mg via INTRAVENOUS
  Filled 2017-07-31 (×2): qty 10

## 2017-07-31 NOTE — Progress Notes (Signed)
Requested LOV notes from PCP x 3 prior to surgery on 08/01/17.

## 2017-07-31 NOTE — Progress Notes (Signed)
Dr Kalman Shan( anesthesia0 made aware of patient hx of mild pulmonary hypertension per 01/18/15-[facility] Clinic note in epic and on chart.  ECHO- 01/19/15- report on chart and have attempted x 3 to obtain LOV with PCP- Dr Hilma Favors with no success.  No new orders given.

## 2017-07-31 NOTE — H&P (Signed)
CC: I have kidney stones.  HPI: Sara Tran is a 61 year-old female patient who is here for renal calculi.    Sara Tran returns today in f/u for her 3.5cm right partial staghorn stone found on CT in May when she was admitted for sepsis. She is morbidly obese and a poor candidate for PCNL or ESWL. She has no voiding symptoms or hematuria at this time. She has some back pain right > left. She has incontinence when she stands after attempting to void.      ALLERGIES: Ciprofloxacin Macrodantin    MEDICATIONS: Metformin Hcl 1,000 mg tablet 1 tablet PO Daily  Cozaar 1 PO Daily  Flonase Allergy Relief 1 PO Daily  Norvasc 1 PO Daily  Pulmicort 1 PO Daily  Tricor 1 PO Daily  Ventolin Hfa 1 PO Daily     GU PSH: None     PSH Notes: heart catheration 2008/2013   NON-GU PSH: Appendectomy (laparoscopic) - 1978 Bariatric surgery - 2011 Cholecystectomy (laparoscopic) - 1985    GU PMH: Kidney Failure Unspec      PMH Notes: heartfailure   NON-GU PMH: Anxiety Arrhythmia Arthritis Asthma Atrial Fibrillation Diabetes Type 2 Heartburn Hypercholesterolemia Hypertension Myocardial Infarction Seizure disorder Sleep Apnea    FAMILY HISTORY: Arthritis - Father, Mother Diabetes - Mother Heart Disease - Mother, Father Hypertension - Mother, Father Kidney Failure - Father Kidney Stones - Father   SOCIAL HISTORY: Marital Status: Single Current Smoking Status: Patient does not smoke anymore. Has not smoked since 07/19/1996. Smoked for 20 years. Smoked 2 packs per day.   Tobacco Use Assessment Completed: Used Tobacco in last 30 days? Does not drink anymore.  Drinks 1 caffeinated drink per day. 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    REVIEW OF SYSTEMS:    GU Review Female:   Patient reports frequent urination, hard to postpone urination, get up at night to urinate, and leakage of urine. Patient denies burning /pain with urination, stream starts and stops, trouble starting your  stream, have to strain to urinate, and being pregnant.  Gastrointestinal (Upper):   Patient reports nausea and vomiting. Patient denies indigestion/ heartburn.  Gastrointestinal (Lower):   Patient reports diarrhea. Patient denies constipation.  Constitutional:   Patient reports night sweats and fatigue. Patient denies fever and weight loss.  Skin:   Patient reports itching. Patient denies skin rash/ lesion.  Eyes:   Patient reports blurred vision. Patient denies double vision.  Ears/ Nose/ Throat:   Patient reports sore throat and sinus problems.   Hematologic/Lymphatic:   Patient denies swollen glands and easy bruising.  Cardiovascular:   Patient reports leg swelling and chest pains.   Respiratory:   Patient reports shortness of breath. Patient denies cough.  Endocrine:   Patient reports excessive thirst.   Musculoskeletal:   Patient reports back pain and joint pain.   Neurological:   Patient reports headaches and dizziness.   Psychologic:   Patient denies depression and anxiety.   VITAL SIGNS:      07/19/2017 11:48 AM  Weight 284 lb / 128.82 kg  Height 58 in / 147.32 cm  BP 152/70 mmHg  Pulse 85 /min  Temperature 98.2 F / 37 C  BMI 59.3 kg/m   MULTI-SYSTEM PHYSICAL EXAMINATION:    Constitutional: Obese. No physical deformities. Normally developed. Good grooming.   Neck: Neck symmetrical, not swollen. Normal tracheal position.  Respiratory: No labored breathing, no use of accessory muscles. CTA  Cardiovascular: Normal temperature,RRR without murmur  Gastrointestinal: Obese abdomen.  Musculoskeletal: Normal gait and station of head and neck.     PAST DATA REVIEWED:  Source Of History:  Patient  Urine Test Review:   Urinalysis  X-Ray Review: C.T. Abdomen/Pelvis: Reviewed Films. Reviewed Report. Discussed With Patient.     PROCEDURES:          Urinalysis - 81003 Dipstick Dipstick Cont'd  Specimen: Voided Bilirubin: Neg  Color: Yellow Ketones: Neg  Appearance: Cloudy Blood:  3+  Specific Gravity: 1.020 Protein: 1+  pH: 6.5 Urobilinogen: 0.2  Glucose: Neg Nitrites: Neg    Leukocyte Esterase: 2+    ASSESSMENT:      ICD-10 Details  1 GU:   Renal calculus - N20.0 She has a 3.2cm right partial staghorn stone and is too obese for ESWL or PCNL in my hands. I offered to refer her for PCNL at a tertiary care center on [place] an do staged ureteroscopy. She would like to proceed with ureteroscopy with the understanding that it may take 2-3 visits to the OR. I have reviewed the risks of bleeding, infection, ureteral injury with stricture, need for multiple procedures, thrombotic events and anesthetic complications.   2   Urinary Tract Inf, Unspec site - N39.0 Her UA shows potential infection today. I will send a micro and culture and treat accordingly.    PLAN:           Orders Labs Urinalysis w/Scope, Urine Culture          Schedule Return Visit/Planned Activity: Next Available Appointment - Schedule Surgery          Document Letter(s):  Created for Patient: Clinical Summary         Notes:   CC: Dr. Sharilyn Sites.

## 2017-08-01 ENCOUNTER — Ambulatory Visit (HOSPITAL_COMMUNITY): Payer: Medicare HMO | Admitting: Anesthesiology

## 2017-08-01 ENCOUNTER — Ambulatory Visit (HOSPITAL_COMMUNITY)
Admission: RE | Admit: 2017-08-01 | Discharge: 2017-08-01 | Disposition: A | Discharge status: Home / Self Care | Payer: Medicare HMO | Source: Ambulatory Visit | Attending: Urology | Admitting: Urology

## 2017-08-01 ENCOUNTER — Encounter (HOSPITAL_COMMUNITY): Payer: Self-pay | Admitting: *Deleted

## 2017-08-01 ENCOUNTER — Ambulatory Visit (HOSPITAL_COMMUNITY): Payer: Medicare HMO

## 2017-08-01 ENCOUNTER — Encounter (HOSPITAL_COMMUNITY)
Admission: RE | Disposition: A | Discharge status: Home / Self Care | Payer: Self-pay | Source: Ambulatory Visit | Attending: Urology

## 2017-08-01 DIAGNOSIS — I10 Essential (primary) hypertension: Secondary | ICD-10-CM | POA: Diagnosis not present

## 2017-08-01 DIAGNOSIS — Z7984 Long term (current) use of oral hypoglycemic drugs: Secondary | ICD-10-CM | POA: Insufficient documentation

## 2017-08-01 DIAGNOSIS — I4891 Unspecified atrial fibrillation: Secondary | ICD-10-CM | POA: Diagnosis not present

## 2017-08-01 DIAGNOSIS — I209 Angina pectoris, unspecified: Secondary | ICD-10-CM | POA: Diagnosis not present

## 2017-08-01 DIAGNOSIS — E119 Type 2 diabetes mellitus without complications: Secondary | ICD-10-CM | POA: Insufficient documentation

## 2017-08-01 DIAGNOSIS — Z87891 Personal history of nicotine dependence: Secondary | ICD-10-CM | POA: Insufficient documentation

## 2017-08-01 DIAGNOSIS — G4730 Sleep apnea, unspecified: Secondary | ICD-10-CM | POA: Insufficient documentation

## 2017-08-01 DIAGNOSIS — R32 Unspecified urinary incontinence: Secondary | ICD-10-CM | POA: Insufficient documentation

## 2017-08-01 DIAGNOSIS — Z79899 Other long term (current) drug therapy: Secondary | ICD-10-CM | POA: Insufficient documentation

## 2017-08-01 DIAGNOSIS — J449 Chronic obstructive pulmonary disease, unspecified: Secondary | ICD-10-CM | POA: Insufficient documentation

## 2017-08-01 DIAGNOSIS — I5032 Chronic diastolic (congestive) heart failure: Secondary | ICD-10-CM | POA: Diagnosis not present

## 2017-08-01 DIAGNOSIS — Z6841 Body Mass Index (BMI) 40.0 and over, adult: Secondary | ICD-10-CM | POA: Diagnosis not present

## 2017-08-01 DIAGNOSIS — I272 Pulmonary hypertension, unspecified: Secondary | ICD-10-CM | POA: Diagnosis not present

## 2017-08-01 DIAGNOSIS — N200 Calculus of kidney: Secondary | ICD-10-CM | POA: Diagnosis not present

## 2017-08-01 DIAGNOSIS — K219 Gastro-esophageal reflux disease without esophagitis: Secondary | ICD-10-CM | POA: Insufficient documentation

## 2017-08-01 DIAGNOSIS — I252 Old myocardial infarction: Secondary | ICD-10-CM | POA: Diagnosis not present

## 2017-08-01 DIAGNOSIS — Z95810 Presence of automatic (implantable) cardiac defibrillator: Secondary | ICD-10-CM | POA: Diagnosis not present

## 2017-08-01 HISTORY — DX: Dyspnea, unspecified: R06.00

## 2017-08-01 HISTORY — PX: CYSTOSCOPY/URETEROSCOPY/HOLMIUM LASER/STENT PLACEMENT: SHX6546

## 2017-08-01 LAB — GLUCOSE, CAPILLARY: Glucose-Capillary: 125 mg/dL — ABNORMAL HIGH (ref 65–99)

## 2017-08-01 SURGERY — CYSTOSCOPY/URETEROSCOPY/HOLMIUM LASER/STENT PLACEMENT
Anesthesia: General | Laterality: Right

## 2017-08-01 MED ORDER — FENTANYL CITRATE (PF) 100 MCG/2ML IJ SOLN
INTRAMUSCULAR | Status: DC | PRN
  Administered 2017-08-01: 100 ug via INTRAVENOUS

## 2017-08-01 MED ORDER — SUCCINYLCHOLINE CHLORIDE 200 MG/10ML IV SOSY
PREFILLED_SYRINGE | INTRAVENOUS | Status: DC | PRN
  Administered 2017-08-01: 140 mg via INTRAVENOUS

## 2017-08-01 MED ORDER — MIDAZOLAM HCL 5 MG/5ML IJ SOLN
INTRAMUSCULAR | Status: DC | PRN
  Administered 2017-08-01: 2 mg via INTRAVENOUS

## 2017-08-01 MED ORDER — PROPOFOL 10 MG/ML IV BOLUS
INTRAVENOUS | Status: DC | PRN
  Administered 2017-08-01: 200 mg via INTRAVENOUS

## 2017-08-01 MED ORDER — IOHEXOL 300 MG/ML  SOLN
INTRAMUSCULAR | Status: DC | PRN
  Administered 2017-08-01: 7 mL via URETHRAL

## 2017-08-01 MED ORDER — LIDOCAINE 2% (20 MG/ML) 5 ML SYRINGE
INTRAMUSCULAR | Status: DC | PRN
  Administered 2017-08-01: 100 mg via INTRAVENOUS

## 2017-08-01 MED ORDER — MIDAZOLAM HCL 2 MG/2ML IJ SOLN
INTRAMUSCULAR | Status: AC
  Filled 2017-08-01: qty 2

## 2017-08-01 MED ORDER — FENTANYL CITRATE (PF) 100 MCG/2ML IJ SOLN
25.0000 ug | INTRAMUSCULAR | Status: DC | PRN
  Administered 2017-08-01 (×2): 50 ug via INTRAVENOUS

## 2017-08-01 MED ORDER — LACTATED RINGERS IV SOLN
INTRAVENOUS | Status: DC
  Administered 2017-08-01: 09:00:00 via INTRAVENOUS

## 2017-08-01 MED ORDER — FENTANYL CITRATE (PF) 100 MCG/2ML IJ SOLN
INTRAMUSCULAR | Status: AC
  Filled 2017-08-01: qty 2

## 2017-08-01 MED ORDER — OXYBUTYNIN CHLORIDE 5 MG PO TABS
5.0000 mg | ORAL_TABLET | Freq: Once | ORAL | Status: AC
  Administered 2017-08-01: 5 mg via ORAL
  Filled 2017-08-01 (×3): qty 1

## 2017-08-01 MED ORDER — HYDROCODONE-ACETAMINOPHEN 5-325 MG PO TABS
1.0000 | ORAL_TABLET | Freq: Once | ORAL | Status: AC
  Administered 2017-08-01: 1 via ORAL
  Filled 2017-08-01: qty 1

## 2017-08-01 MED ORDER — PROPOFOL 10 MG/ML IV BOLUS
INTRAVENOUS | Status: AC
  Filled 2017-08-01: qty 20

## 2017-08-01 MED ORDER — SUCCINYLCHOLINE CHLORIDE 200 MG/10ML IV SOSY
PREFILLED_SYRINGE | INTRAVENOUS | Status: AC
  Filled 2017-08-01: qty 10

## 2017-08-01 MED ORDER — ONDANSETRON HCL 4 MG/2ML IJ SOLN
INTRAMUSCULAR | Status: DC | PRN
  Administered 2017-08-01: 4 mg via INTRAVENOUS

## 2017-08-01 MED ORDER — ONDANSETRON HCL 4 MG/2ML IJ SOLN
INTRAMUSCULAR | Status: AC
  Filled 2017-08-01: qty 2

## 2017-08-01 MED ORDER — OXYBUTYNIN CHLORIDE 5 MG PO TABS: 5.0000 mg | ORAL_TABLET | Freq: Three times a day (TID) | ORAL | 0 refills | Status: DC | PRN

## 2017-08-01 MED ORDER — HYDROCODONE-ACETAMINOPHEN 5-325 MG PO TABS: 1.0000 | ORAL_TABLET | Freq: Four times a day (QID) | ORAL | 0 refills | Status: DC | PRN

## 2017-08-01 MED ORDER — DEXAMETHASONE SODIUM PHOSPHATE 10 MG/ML IJ SOLN
INTRAMUSCULAR | Status: AC
  Filled 2017-08-01: qty 1

## 2017-08-01 MED ORDER — SODIUM CHLORIDE 0.9 % IR SOLN
Status: DC | PRN
  Administered 2017-08-01: 5000 mL via INTRAVESICAL

## 2017-08-01 MED ORDER — LIDOCAINE 2% (20 MG/ML) 5 ML SYRINGE
INTRAMUSCULAR | Status: AC
  Filled 2017-08-01: qty 5

## 2017-08-01 MED ORDER — AMPICILLIN 500 MG PO CAPS: 500.0000 mg | ORAL_CAPSULE | Freq: Every day | ORAL | 0 refills | Status: DC

## 2017-08-01 SURGICAL SUPPLY — 22 items
BAG URO CATCHER STRL LF (MISCELLANEOUS) ×2 IMPLANT
BASKET STONE NCOMPASS (UROLOGICAL SUPPLIES) IMPLANT
CATH URET 5FR 28IN OPEN ENDED (CATHETERS) ×2 IMPLANT
CATH URET DUAL LUMEN 6-10FR 50 (CATHETERS) IMPLANT
CLOTH BEACON ORANGE TIMEOUT ST (SAFETY) ×2 IMPLANT
COVER SURGICAL LIGHT HANDLE (MISCELLANEOUS) IMPLANT
EXTRACTOR STONE NITINOL NGAGE (UROLOGICAL SUPPLIES) IMPLANT
FIBER LASER FLEXIVA 1000 (UROLOGICAL SUPPLIES) IMPLANT
FIBER LASER FLEXIVA 365 (UROLOGICAL SUPPLIES) IMPLANT
FIBER LASER FLEXIVA 550 (UROLOGICAL SUPPLIES) IMPLANT
FIBER LASER TRAC TIP (UROLOGICAL SUPPLIES) ×2 IMPLANT
GLOVE SURG SS PI 8.0 STRL IVOR (GLOVE) ×2 IMPLANT
GOWN STRL REUS W/TWL XL LVL3 (GOWN DISPOSABLE) ×2 IMPLANT
GUIDEWIRE STR DUAL SENSOR (WIRE) ×2 IMPLANT
IV NS 1000ML (IV SOLUTION)
IV NS 1000ML BAXH (IV SOLUTION) IMPLANT
IV NS IRRIG 3000ML ARTHROMATIC (IV SOLUTION) IMPLANT
MANIFOLD NEPTUNE II (INSTRUMENTS) ×2 IMPLANT
NS IRRIG 1000ML POUR BTL (IV SOLUTION) ×2 IMPLANT
PACK CYSTO (CUSTOM PROCEDURE TRAY) ×2 IMPLANT
SHEATH ACCESS URETERAL 38CM (SHEATH) ×2 IMPLANT
TUBING CONNECTING 10 (TUBING) ×2 IMPLANT

## 2017-08-01 NOTE — Transfer of Care (Signed)
Immediate Anesthesia Transfer of Care Note  Patient: Sara Tran  Procedure(s) Performed: Procedure(s): CYSTOSCOPY/ RIGHT URETEROSCOPY/ RIGHT RETROGRADE/ HOLMIUM LASER  AND RIGHT STENT PLACEMENT FIRST STAGE (Right)  Patient Location: PACU  Anesthesia Type:General  Level of Consciousness: sedated  Airway & Oxygen Therapy: Patient Spontanous Breathing and Patient connected to face mask oxygen  Post-op Assessment: Report given to RN and Post -op Vital signs reviewed and stable  Post vital signs: Reviewed and stable  Last Vitals:  Vitals:   08/01/17 0717  BP: (!) 139/59  Pulse: 80  Resp: 18  Temp: 36.8 C    Last Pain:  Vitals:   08/01/17 0837  TempSrc:   PainSc: 0-No pain      Patients Stated Pain Goal: 4 (28/40/69 8614)  Complications: No apparent anesthesia complications

## 2017-08-01 NOTE — Discharge Instructions (Addendum)
General Anesthesia, Adult, Care After These instructions provide you with information about caring for yourself after your procedure. Your health care provider may also give you more specific instructions. Your treatment has been planned according to current medical practices, but problems sometimes occur. Call your health care provider if you have any problems or questions after your procedure. What can I expect after the procedure? After the procedure, it is common to have:  Vomiting.  A sore throat.  Mental slowness.  It is common to feel:  Nauseous.  Cold or shivery.  Sleepy.  Tired.  Sore or achy, even in parts of your body where you did not have surgery.  Follow these instructions at home: For at least 24 hours after the procedure:  Do not: ? Participate in activities where you could fall or become injured. ? Drive. ? Use heavy machinery. ? Drink alcohol. ? Take sleeping pills or medicines that cause drowsiness. ? Make important decisions or sign legal documents. ? Take care of children on your own.  Rest. Eating and drinking  If you vomit, drink water, juice, or soup when you can drink without vomiting.  Drink enough fluid to keep your urine clear or pale yellow.  Make sure you have little or no nausea before eating solid foods.  Follow the diet recommended by your health care provider. General instructions  Have a responsible adult stay with you until you are awake and alert.  Return to your normal activities as told by your health care provider. Ask your health care provider what activities are safe for you.  Take over-the-counter and prescription medicines only as told by your health care provider.  If you smoke, do not smoke without supervision.  Keep all follow-up visits as told by your health care provider. This is important. Contact a health care provider if:  You continue to have nausea or vomiting at home, and medicines are not helpful.  You  cannot drink fluids or start eating again.  You cannot urinate after 8-12 hours.  You develop a skin rash.  You have fever.  You have increasing redness at the site of your procedure. Get help right away if:  You have difficulty breathing.  You have chest pain.  You have unexpected bleeding.  You feel that you are having a life-threatening or urgent problem. This information is not intended to replace advice given to you by your health care provider. Make sure you discuss any questions you have with your health care provider. Document Released: 03/25/2001 Document Revised: 05/21/2016 Document Reviewed: 12/01/2015 Elsevier Interactive Patient Education  2018 [place]. Ureteral Stent Implantation, Care After Refer to this sheet in the next few weeks. These instructions provide you with information about caring for yourself after your procedure. Your health care provider may also give you more specific instructions. Your treatment has been planned according to current medical practices, but problems sometimes occur. Call your health care provider if you have any problems or questions after your procedure. What can I expect after the procedure? After the procedure, it is common to have:  Nausea.  Mild pain when you urinate. You may feel this pain in your lower back or lower abdomen. Pain should stop within a few minutes after you urinate. This may last for up to 1 week.  A small amount of blood in your urine for several days.  Follow these instructions at home:  Medicines  Take over-the-counter and prescription medicines only as told by your health care provider.  If you were prescribed an antibiotic medicine, take it as told by your health care provider. Do not stop taking the antibiotic even if you start to feel better.  Do not drive for 24 hours if you received a sedative.  Do not drive or operate heavy machinery while taking prescription pain  medicines. Activity  Return to your normal activities as told by your health care provider. Ask your health care provider what activities are safe for you.  Do not lift anything that is heavier than 10 lb (4.5 kg). Follow this limit for 1 week after your procedure, or for as long as told by your health care provider. General instructions  Watch for any blood in your urine. Call your health care provider if the amount of blood in your urine increases.  If you have a catheter: ? Follow instructions from your health care provider about taking care of your catheter and collection bag. ? Do not take baths, swim, or use a hot tub until your health care provider approves.  Drink enough fluid to keep your urine clear or pale yellow.  Keep all follow-up visits as told by your health care provider. This is important. Contact a health care provider if:  You have pain that gets worse or does not get better with medicine, especially pain when you urinate.  You have difficulty urinating.  You feel nauseous or you vomit repeatedly during a period of more than 2 days after the procedure. Get help right away if:  Your urine is dark red or has blood clots in it.  You are leaking urine (have incontinence).  The end of the stent comes out of your urethra.  You cannot urinate.  You have sudden, sharp, or severe pain in your abdomen or lower back.  You have a fever. This information is not intended to replace advice given to you by your health care provider. Make sure you discuss any questions you have with your health care provider. Document Released: 08/19/2013 Document Revised: 05/24/2016 Document Reviewed: 07/01/2015 Elsevier Interactive Patient Education  [employer].

## 2017-08-01 NOTE — Anesthesia Preprocedure Evaluation (Addendum)
Anesthesia Evaluation  Patient identified by MRN, date of birth, ID band Patient awake    Reviewed: Allergy & Precautions, NPO status , Patient's Chart, lab work & pertinent test results  Airway Mallampati: II  TM Distance: >3 FB     Dental   Pulmonary shortness of breath, asthma , sleep apnea , COPD, former smoker,    breath sounds clear to auscultation       Cardiovascular hypertension, + angina + Peripheral Vascular Disease  + Valvular Problems/Murmurs  Rhythm:Regular Rate:Normal     Neuro/Psych  Headaches,  Neuromuscular disease    GI/Hepatic GERD  ,  Endo/Other  diabetes  Renal/GU Renal disease     Musculoskeletal   Abdominal   Peds  Hematology   Anesthesia Other Findings   Reproductive/Obstetrics                             Anesthesia Physical Anesthesia Plan  ASA: III  Anesthesia Plan: General   Post-op Pain Management:    Induction: Intravenous  PONV Risk Score and Plan: 3 and Ondansetron, Dexamethasone, Midazolam and Propofol infusion  Airway Management Planned: Oral ETT  Additional Equipment:   Intra-op Plan:   Post-operative Plan: Extubation in OR  Informed Consent: I have reviewed the patients History and Physical, chart, labs and discussed the procedure including the risks, benefits and alternatives for the proposed anesthesia with the patient or authorized representative who has indicated his/her understanding and acceptance.   Dental advisory given  Plan Discussed with: CRNA and Anesthesiologist  Anesthesia Plan Comments:         Anesthesia Quick Evaluation

## 2017-08-01 NOTE — Anesthesia Procedure Notes (Signed)
Procedure Name: Intubation Date/Time: 08/01/2017 9:21 AM Performed by: Lind Covert Pre-anesthesia Checklist: Patient identified, Emergency Drugs available, Suction available, Patient being monitored and Timeout performed Patient Re-evaluated:Patient Re-evaluated prior to induction Oxygen Delivery Method: Circle system utilized Preoxygenation: Pre-oxygenation with 100% oxygen (hob elevated with intubation pad) Induction Type: IV induction Laryngoscope Size: 4 and Mac Grade View: Grade I Tube type: Oral Tube size: 7.0 mm Number of attempts: 1 Airway Equipment and Method: Stylet Placement Confirmation: ETT inserted through vocal cords under direct vision,  breath sounds checked- equal and bilateral and positive ETCO2 Secured at: 22 cm Tube secured with: Tape Dental Injury: Teeth and Oropharynx as per pre-operative assessment

## 2017-08-01 NOTE — Anesthesia Postprocedure Evaluation (Signed)
Anesthesia Post Note  Patient: Sara Tran  Procedure(s) Performed: Procedure(s) (LRB): CYSTOSCOPY/ RIGHT URETEROSCOPY/ RIGHT RETROGRADE/ HOLMIUM LASER  AND RIGHT STENT PLACEMENT FIRST STAGE (Right)     Patient location during evaluation: PACU Anesthesia Type: General Level of consciousness: awake Pain management: pain level controlled Vital Signs Assessment: post-procedure vital signs reviewed and stable Respiratory status: spontaneous breathing Cardiovascular status: stable Anesthetic complications: no    Last Vitals:  Vitals:   08/01/17 1109 08/01/17 1117  BP: (!) 158/78 133/72  Pulse: 87   Resp: 16 16  Temp: 36.5 C 36.7 C    Last Pain:  Vitals:   08/01/17 1109  TempSrc:   PainSc: 6                  Sanja Elizardo

## 2017-08-01 NOTE — Brief Op Note (Signed)
08/01/2017  10:19 AM  PATIENT:  Sara Tran  61 y.o. female  PRE-OPERATIVE DIAGNOSIS:  RIGHT STAGHORN  POST-OPERATIVE DIAGNOSIS:  RIGHT STAGHORN  PROCEDURE:  Procedure(s): CYSTOSCOPY/ RIGHT URETEROSCOPY/ RIGHT RETROGRADE/ HOLMIUM LASER  AND RIGHT STENT PLACEMENT FIRST STAGE (Right)  SURGEON:  Surgeon(s) and Role:    Irine Seal, MD - Primary  PHYSICIAN ASSISTANT:   ASSISTANTS: none   ANESTHESIA:   general  EBL:  No intake/output data recorded.  BLOOD ADMINISTERED:none  DRAINS: 6 x 24 right JJ stent   LOCAL MEDICATIONS USED:  NONE  SPECIMEN:  Source of Specimen:  stone fragments  DISPOSITION OF SPECIMEN:  PATHOLOGY  COUNTS:  YES  TOURNIQUET:  * No tourniquets in log *  DICTATION: .Other Dictation: Dictation Number [id number]  PLAN OF CARE: Discharge to home after PACU  PATIENT DISPOSITION:  PACU - hemodynamically stable.   Delay start of Pharmacological VTE agent (>24hrs) due to surgical blood loss or risk of bleeding: not applicable

## 2017-08-01 NOTE — Interval H&P Note (Signed)
History and Physical Interval Note:  08/01/2017 8:51 AM  Sara Tran  has presented today for surgery, with the diagnosis of RIGHT STAGHORN  The various methods of treatment have been discussed with the patient and family. After consideration of risks, benefits and other options for treatment, the patient has consented to  Procedure(s): CYSTOSCOPY RIGHT /URETEROSCOPY HOLMIUM LASER  AND RIGHTSTENT PLACEMENT FIRST STAGE (Right) as a surgical intervention .  The patient's history has been reviewed, patient examined, no change in status, stable for surgery.  I have reviewed the patient's chart and labs.  Questions were answered to the patient's satisfaction.     Vila Dory J

## 2017-08-02 ENCOUNTER — Encounter (HOSPITAL_COMMUNITY): Payer: Self-pay | Admitting: Urology

## 2017-08-02 NOTE — Op Note (Signed)
Sara Tran, POYNOR NO.:  [id number]  MEDICAL RECORD NO.:  [id number]  LOCATION:                                 FACILITY:  PHYSICIAN:  Marshall Cork. Jeffie Pollock, M.D.    DATE OF BIRTH:  [birth year]  DATE OF PROCEDURE:  08/01/2017 DATE OF DISCHARGE:                              OPERATIVE REPORT   PROCEDURE: 1. Cystoscopy with right retrograde pyelogram and interpretation. 2. First stage ureteroscopy with lithotripsy stent.  PREOPERATIVE DIAGNOSIS:  A 3.5 cm right partial staghorn calculus.  POSTOPERATIVE DIAGNOSIS:  A 3.5 cm right partial staghorn calculus.  SURGEON:  Marshall Cork. Jeffie Pollock, M.D.  ANESTHESIA:  General.  SPECIMEN:  Stone fragments.  DRAINS:  A 6-French x 24 cm right double-J stent.  BLOOD LOSS:  None.  COMPLICATIONS:  None.  INDICATIONS:  Ms. 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  She was originally admitted with sepsis, but there was no obstruction, so no stent was placed at that time.  She returns for ureteroscopy because her body habitus precludes percutaneous nephrolithotomy or extracorporeal shockwave lithotripsy.  DESCRIPTION OF PROCEDURE:  She was given gentamicin and general anesthetic was induced.  She was placed in lithotomy position.  Her perineum and genitalia were prepped with Betadine solution.  She was draped in usual sterile fashion.  Cystoscopy was performed using a 23-French scope and 30-degree lens. Examination revealed a normal urethra.  The bladder wall was smooth with changes consistent with diffuse follicular cystitis.  Ureteral orifices were unremarkable.  The right ureteral orifice was cannulated with 5-French open-end catheter and contrast was instilled.  This revealed a normal ureter. However, the renal collecting system while delicate had a filling defect consistent with a stone largely filling the collecting system.  Once  retrograde pyelography was performed, a Sensor guidewire was passed through the kidney and the cystoscope was removed.  A 38 cm 12/14 digital access sheath inner core was passed to the kidney.  I could not get it quite into the renal pelvis due to angulation, but well into the proximal ureter, then assembled the sheath and inserted without difficulty.  The inner core and wire were then removed and a dual-lumen digital flexible ureteroscope was then passed through the sheath to the kidney.  The stone was readily visualized in the renal pelvis.  A 200 micron TracTip laser fiber was then inserted.  It was initially set on 0.5 watts and 10 hertz but eventually carried up to 50 hertz. The power was increased to 0.8 watts at one point with a reduction in the rate to 20.  Using this combination of powers, energy and frequency, I was able to break the stone up quite readily.  The bulk of the stone flushed out as dust, but several small fragments remained in the kidney at the end of the procedure.  Approximately 30 minutes of lasing was performed.  At this point, when the stone was reduced to small fragments and the bulk had been flushed out, the guidewire was reinserted through the access sheath.  The access sheath was removed.  The cystoscope was reinserted over the wire and a 6-French 24 cm double-J stent without tether was passed to the kidney under fluoroscopic guidance.  The wire was then removed leaving good coil in the kidney and good coil in the bladder.  At this point, the bladder was drained.  The patient was taken down from lithotomy position.  Her anesthetic was reversed and she was moved to recovery room in stable condition.  This was the first stage of two staged ureteroscopy, and I will return for a second look next week.     Marshall Cork. Jeffie Pollock, M.D.   ______________________________ Marshall Cork. Jeffie Pollock, M.D.    JJW/MEDQ  D:  08/01/2017  T:  08/01/2017  Job:  [id number]

## 2017-08-07 LAB — STONE ANALYSIS
Ammonium Acid Urate: 20 %
Ca Oxalate,Monohydr.: 35 %
Ca phos cry stone ql IR: 30 %
Magnesium Ammon Phos: 15 %
Stone Weight KSTONE: 97 mg

## 2017-08-08 ENCOUNTER — Encounter (HOSPITAL_COMMUNITY)
Admission: RE | Disposition: A | Discharge status: Home / Self Care | Payer: Self-pay | Source: Ambulatory Visit | Attending: Urology

## 2017-08-08 ENCOUNTER — Encounter (HOSPITAL_COMMUNITY): Payer: Self-pay | Admitting: *Deleted

## 2017-08-08 ENCOUNTER — Ambulatory Visit (HOSPITAL_COMMUNITY): Payer: Medicare HMO

## 2017-08-08 ENCOUNTER — Inpatient Hospital Stay (HOSPITAL_COMMUNITY)
Admission: RE | Admit: 2017-08-08 | Discharge: 2017-08-11 | DRG: 872 | Disposition: A | Discharge status: Home / Self Care | Payer: Medicare HMO | Source: Ambulatory Visit | Attending: Urology | Admitting: Urology

## 2017-08-08 ENCOUNTER — Ambulatory Visit (HOSPITAL_COMMUNITY): Payer: Medicare HMO | Admitting: Registered Nurse

## 2017-08-08 ENCOUNTER — Observation Stay (HOSPITAL_COMMUNITY): Payer: Medicare HMO

## 2017-08-08 DIAGNOSIS — I5033 Acute on chronic diastolic (congestive) heart failure: Secondary | ICD-10-CM | POA: Diagnosis present

## 2017-08-08 DIAGNOSIS — Z803 Family history of malignant neoplasm of breast: Secondary | ICD-10-CM

## 2017-08-08 DIAGNOSIS — I272 Pulmonary hypertension, unspecified: Secondary | ICD-10-CM | POA: Diagnosis present

## 2017-08-08 DIAGNOSIS — J449 Chronic obstructive pulmonary disease, unspecified: Secondary | ICD-10-CM | POA: Diagnosis present

## 2017-08-08 DIAGNOSIS — I252 Old myocardial infarction: Secondary | ICD-10-CM | POA: Diagnosis not present

## 2017-08-08 DIAGNOSIS — Z833 Family history of diabetes mellitus: Secondary | ICD-10-CM

## 2017-08-08 DIAGNOSIS — Z9884 Bariatric surgery status: Secondary | ICD-10-CM | POA: Diagnosis not present

## 2017-08-08 DIAGNOSIS — N200 Calculus of kidney: Secondary | ICD-10-CM | POA: Diagnosis present

## 2017-08-08 DIAGNOSIS — R079 Chest pain, unspecified: Secondary | ICD-10-CM | POA: Diagnosis present

## 2017-08-08 DIAGNOSIS — G40909 Epilepsy, unspecified, not intractable, without status epilepticus: Secondary | ICD-10-CM | POA: Diagnosis present

## 2017-08-08 DIAGNOSIS — Z91040 Latex allergy status: Secondary | ICD-10-CM

## 2017-08-08 DIAGNOSIS — E662 Morbid (severe) obesity with alveolar hypoventilation: Secondary | ICD-10-CM | POA: Diagnosis present

## 2017-08-08 DIAGNOSIS — E1122 Type 2 diabetes mellitus with diabetic chronic kidney disease: Secondary | ICD-10-CM | POA: Diagnosis present

## 2017-08-08 DIAGNOSIS — Z87891 Personal history of nicotine dependence: Secondary | ICD-10-CM

## 2017-08-08 DIAGNOSIS — G4730 Sleep apnea, unspecified: Secondary | ICD-10-CM | POA: Diagnosis not present

## 2017-08-08 DIAGNOSIS — M1990 Unspecified osteoarthritis, unspecified site: Secondary | ICD-10-CM | POA: Diagnosis present

## 2017-08-08 DIAGNOSIS — Z6841 Body Mass Index (BMI) 40.0 and over, adult: Secondary | ICD-10-CM

## 2017-08-08 DIAGNOSIS — N179 Acute kidney failure, unspecified: Secondary | ICD-10-CM | POA: Diagnosis not present

## 2017-08-08 DIAGNOSIS — I130 Hypertensive heart and chronic kidney disease with heart failure and stage 1 through stage 4 chronic kidney disease, or unspecified chronic kidney disease: Secondary | ICD-10-CM | POA: Diagnosis present

## 2017-08-08 DIAGNOSIS — N390 Urinary tract infection, site not specified: Secondary | ICD-10-CM | POA: Diagnosis not present

## 2017-08-08 DIAGNOSIS — E1141 Type 2 diabetes mellitus with diabetic mononeuropathy: Secondary | ICD-10-CM | POA: Diagnosis present

## 2017-08-08 DIAGNOSIS — N183 Chronic kidney disease, stage 3 (moderate): Secondary | ICD-10-CM | POA: Diagnosis present

## 2017-08-08 DIAGNOSIS — E119 Type 2 diabetes mellitus without complications: Secondary | ICD-10-CM

## 2017-08-08 DIAGNOSIS — I5032 Chronic diastolic (congestive) heart failure: Secondary | ICD-10-CM | POA: Diagnosis present

## 2017-08-08 DIAGNOSIS — K219 Gastro-esophageal reflux disease without esophagitis: Secondary | ICD-10-CM | POA: Diagnosis present

## 2017-08-08 DIAGNOSIS — Z881 Allergy status to other antibiotic agents status: Secondary | ICD-10-CM

## 2017-08-08 DIAGNOSIS — N289 Disorder of kidney and ureter, unspecified: Secondary | ICD-10-CM

## 2017-08-08 DIAGNOSIS — Z888 Allergy status to other drugs, medicaments and biological substances status: Secondary | ICD-10-CM | POA: Diagnosis not present

## 2017-08-08 DIAGNOSIS — Z8249 Family history of ischemic heart disease and other diseases of the circulatory system: Secondary | ICD-10-CM | POA: Diagnosis not present

## 2017-08-08 DIAGNOSIS — I4891 Unspecified atrial fibrillation: Secondary | ICD-10-CM | POA: Diagnosis present

## 2017-08-08 DIAGNOSIS — E785 Hyperlipidemia, unspecified: Secondary | ICD-10-CM | POA: Diagnosis present

## 2017-08-08 DIAGNOSIS — N189 Chronic kidney disease, unspecified: Secondary | ICD-10-CM

## 2017-08-08 DIAGNOSIS — K922 Gastrointestinal hemorrhage, unspecified: Secondary | ICD-10-CM | POA: Diagnosis not present

## 2017-08-08 DIAGNOSIS — N3090 Cystitis, unspecified without hematuria: Secondary | ICD-10-CM | POA: Diagnosis present

## 2017-08-08 DIAGNOSIS — A419 Sepsis, unspecified organism: Secondary | ICD-10-CM | POA: Diagnosis present

## 2017-08-08 DIAGNOSIS — Z9049 Acquired absence of other specified parts of digestive tract: Secondary | ICD-10-CM | POA: Diagnosis not present

## 2017-08-08 DIAGNOSIS — Z91030 Bee allergy status: Secondary | ICD-10-CM

## 2017-08-08 DIAGNOSIS — Z886 Allergy status to analgesic agent status: Secondary | ICD-10-CM

## 2017-08-08 DIAGNOSIS — Z91011 Allergy to milk products: Secondary | ICD-10-CM

## 2017-08-08 HISTORY — PX: CYSTOSCOPY/URETEROSCOPY/HOLMIUM LASER/STENT PLACEMENT: SHX6546

## 2017-08-08 LAB — CBC WITH DIFFERENTIAL/PLATELET
Basophils Absolute: 0 10*3/uL (ref 0.0–0.1)
Basophils Relative: 0 %
Eosinophils Absolute: 0.2 10*3/uL (ref 0.0–0.7)
Eosinophils Relative: 1 %
HCT: 34.4 % — ABNORMAL LOW (ref 36.0–46.0)
Hemoglobin: 11.2 g/dL — ABNORMAL LOW (ref 12.0–15.0)
Lymphocytes Relative: 4 %
Lymphs Abs: 0.8 10*3/uL (ref 0.7–4.0)
MCH: 30.5 pg (ref 26.0–34.0)
MCHC: 32.6 g/dL (ref 30.0–36.0)
MCV: 93.7 fL (ref 78.0–100.0)
Monocytes Absolute: 1.3 10*3/uL — ABNORMAL HIGH (ref 0.1–1.0)
Monocytes Relative: 7 %
Neutro Abs: 16.8 10*3/uL — ABNORMAL HIGH (ref 1.7–7.7)
Neutrophils Relative %: 88 %
Platelets: 316 10*3/uL (ref 150–400)
RBC: 3.67 MIL/uL — ABNORMAL LOW (ref 3.87–5.11)
RDW: 13 % (ref 11.5–15.5)
WBC: 19 10*3/uL — ABNORMAL HIGH (ref 4.0–10.5)

## 2017-08-08 LAB — PROTIME-INR
INR: 1.1
Prothrombin Time: 14.2 seconds (ref 11.4–15.2)

## 2017-08-08 LAB — COMPREHENSIVE METABOLIC PANEL
ALT: 18 U/L (ref 14–54)
AST: 23 U/L (ref 15–41)
Albumin: 3.7 g/dL (ref 3.5–5.0)
Alkaline Phosphatase: 42 U/L (ref 38–126)
Anion gap: 10 (ref 5–15)
BUN: 19 mg/dL (ref 6–20)
CO2: 24 mmol/L (ref 22–32)
Calcium: 9 mg/dL (ref 8.9–10.3)
Chloride: 103 mmol/L (ref 101–111)
Creatinine, Ser: 1.72 mg/dL — ABNORMAL HIGH (ref 0.44–1.00)
GFR calc Af Amer: 36 mL/min — ABNORMAL LOW (ref >60)
GFR calc non Af Amer: 31 mL/min — ABNORMAL LOW (ref >60)
Glucose, Bld: 193 mg/dL — ABNORMAL HIGH (ref 65–99)
Potassium: 4.5 mmol/L (ref 3.5–5.1)
Sodium: 137 mmol/L (ref 135–145)
Total Bilirubin: 0.5 mg/dL (ref 0.3–1.2)
Total Protein: 7.4 g/dL (ref 6.5–8.1)

## 2017-08-08 LAB — URINALYSIS, ROUTINE W REFLEX MICROSCOPIC
Bacteria, UA: NONE SEEN
Bilirubin Urine: NEGATIVE
Glucose, UA: NEGATIVE mg/dL
Ketones, ur: NEGATIVE mg/dL
Nitrite: NEGATIVE
Protein, ur: 100 mg/dL — AB
Specific Gravity, Urine: 1.014 (ref 1.005–1.030)
pH: 5 (ref 5.0–8.0)

## 2017-08-08 LAB — GLUCOSE, CAPILLARY
Glucose-Capillary: 145 mg/dL — ABNORMAL HIGH (ref 65–99)
Glucose-Capillary: 130 mg/dL — ABNORMAL HIGH (ref 65–99)
Glucose-Capillary: 203 mg/dL — ABNORMAL HIGH (ref 65–99)
Glucose-Capillary: 154 mg/dL — ABNORMAL HIGH (ref 65–99)

## 2017-08-08 LAB — LACTIC ACID, PLASMA
Lactic Acid, Venous: 2.5 mmol/L (ref 0.5–1.9)
Lactic Acid, Venous: 3.2 mmol/L (ref 0.5–1.9)

## 2017-08-08 LAB — TROPONIN I
Troponin I: <0.03 ng/mL (ref <0.03)
Troponin I: <0.03 ng/mL (ref <0.03)

## 2017-08-08 LAB — PROCALCITONIN: Procalcitonin: 4.02 ng/mL

## 2017-08-08 LAB — APTT: aPTT: 29 seconds (ref 24–36)

## 2017-08-08 SURGERY — CYSTOSCOPY/URETEROSCOPY/HOLMIUM LASER/STENT PLACEMENT
Anesthesia: General | Site: Ureter | Laterality: Right

## 2017-08-08 MED ORDER — FENTANYL CITRATE (PF) 100 MCG/2ML IJ SOLN
INTRAMUSCULAR | Status: DC | PRN
  Administered 2017-08-08: 50 ug via INTRAVENOUS

## 2017-08-08 MED ORDER — FENOFIBRATE 160 MG PO TABS
160.0000 mg | ORAL_TABLET | Freq: Every day | ORAL | Status: DC
  Administered 2017-08-08 – 2017-08-09 (×2): 160 mg via ORAL
  Filled 2017-08-08 (×4): qty 1

## 2017-08-08 MED ORDER — LACTATED RINGERS IV SOLN
INTRAVENOUS | Status: DC | PRN
  Administered 2017-08-08: 07:00:00 via INTRAVENOUS

## 2017-08-08 MED ORDER — ISOSORBIDE MONONITRATE ER 30 MG PO TB24
30.0000 mg | ORAL_TABLET | Freq: Every day | ORAL | Status: DC
  Administered 2017-08-09 – 2017-08-11 (×3): 30 mg via ORAL
  Filled 2017-08-08 (×3): qty 1

## 2017-08-08 MED ORDER — MORPHINE SULFATE (PF) 4 MG/ML IV SOLN
2.0000 mg | INTRAVENOUS | Status: DC | PRN
Start: 2017-08-08 — End: 2017-08-08

## 2017-08-08 MED ORDER — SODIUM CHLORIDE 0.9 % IV BOLUS (SEPSIS)
2000.0000 mL | Freq: Once | INTRAVENOUS | Status: AC
  Administered 2017-08-08: 2000 mL via INTRAVENOUS

## 2017-08-08 MED ORDER — VANCOMYCIN HCL 10 G IV SOLR
1500.0000 mg | INTRAVENOUS | Status: DC
  Administered 2017-08-09 – 2017-08-10 (×2): 1500 mg via INTRAVENOUS
  Filled 2017-08-08 (×3): qty 1500

## 2017-08-08 MED ORDER — SENNOSIDES-DOCUSATE SODIUM 8.6-50 MG PO TABS: 1.0000 | ORAL_TABLET | Freq: Every evening | ORAL | Status: DC | PRN

## 2017-08-08 MED ORDER — OXYBUTYNIN CHLORIDE 5 MG PO TABS: 5.0000 mg | ORAL_TABLET | Freq: Three times a day (TID) | ORAL | Status: DC | PRN

## 2017-08-08 MED ORDER — HYDROMORPHONE HCL-NACL 0.5-0.9 MG/ML-% IV SOSY
0.5000 mg | PREFILLED_SYRINGE | INTRAVENOUS | Status: DC | PRN
  Administered 2017-08-09 – 2017-08-11 (×9): 1 mg via INTRAVENOUS
  Filled 2017-08-08 (×9): qty 2

## 2017-08-08 MED ORDER — PHENYLEPHRINE 40 MCG/ML (10ML) SYRINGE FOR IV PUSH (FOR BLOOD PRESSURE SUPPORT)
PREFILLED_SYRINGE | INTRAVENOUS | Status: AC
  Filled 2017-08-08: qty 10

## 2017-08-08 MED ORDER — FENTANYL CITRATE (PF) 100 MCG/2ML IJ SOLN
INTRAMUSCULAR | Status: AC
  Filled 2017-08-08: qty 2

## 2017-08-08 MED ORDER — POTASSIUM CHLORIDE CRYS ER 20 MEQ PO TBCR
20.0000 meq | EXTENDED_RELEASE_TABLET | Freq: Every morning | ORAL | Status: DC
  Administered 2017-08-08 – 2017-08-11 (×4): 20 meq via ORAL
  Filled 2017-08-08 (×4): qty 1

## 2017-08-08 MED ORDER — ONDANSETRON HCL 4 MG/2ML IJ SOLN
INTRAMUSCULAR | Status: AC
  Filled 2017-08-08: qty 2

## 2017-08-08 MED ORDER — SODIUM CHLORIDE 0.9 % IV SOLN: 250.0000 mL | INTRAVENOUS | Status: DC | PRN

## 2017-08-08 MED ORDER — ROCURONIUM BROMIDE 10 MG/ML (PF) SYRINGE
PREFILLED_SYRINGE | INTRAVENOUS | Status: DC | PRN
  Administered 2017-08-08: 10 mg via INTRAVENOUS
  Administered 2017-08-08: 20 mg via INTRAVENOUS

## 2017-08-08 MED ORDER — ALBUTEROL SULFATE HFA 108 (90 BASE) MCG/ACT IN AERS: 2.0000 | INHALATION_SPRAY | Freq: Four times a day (QID) | RESPIRATORY_TRACT | Status: DC | PRN

## 2017-08-08 MED ORDER — PIPERACILLIN-TAZOBACTAM 3.375 G IVPB
3.3750 g | Freq: Three times a day (TID) | INTRAVENOUS | Status: DC
  Administered 2017-08-08 – 2017-08-11 (×8): 3.375 g via INTRAVENOUS
  Filled 2017-08-08 (×8): qty 50

## 2017-08-08 MED ORDER — METFORMIN HCL ER 500 MG PO TB24: 500.0000 mg | ORAL_TABLET | Freq: Every day | ORAL | Status: DC

## 2017-08-08 MED ORDER — LORATADINE 10 MG PO TABS
10.0000 mg | ORAL_TABLET | Freq: Every day | ORAL | Status: DC
  Administered 2017-08-09 – 2017-08-11 (×3): 10 mg via ORAL
  Filled 2017-08-08 (×3): qty 1

## 2017-08-08 MED ORDER — MEPERIDINE HCL 50 MG/ML IJ SOLN
INTRAMUSCULAR | Status: AC
  Filled 2017-08-08: qty 1

## 2017-08-08 MED ORDER — SUCCINYLCHOLINE CHLORIDE 200 MG/10ML IV SOSY
PREFILLED_SYRINGE | INTRAVENOUS | Status: DC | PRN
  Administered 2017-08-08: 100 mg via INTRAVENOUS

## 2017-08-08 MED ORDER — FENTANYL CITRATE (PF) 100 MCG/2ML IJ SOLN
25.0000 ug | INTRAMUSCULAR | Status: DC | PRN
  Administered 2017-08-08 (×2): 50 ug via INTRAVENOUS

## 2017-08-08 MED ORDER — SUCCINYLCHOLINE CHLORIDE 200 MG/10ML IV SOSY
PREFILLED_SYRINGE | INTRAVENOUS | Status: AC
  Filled 2017-08-08: qty 10

## 2017-08-08 MED ORDER — TIZANIDINE HCL 4 MG PO TABS: 4.0000 mg | ORAL_TABLET | Freq: Three times a day (TID) | ORAL | Status: DC | PRN

## 2017-08-08 MED ORDER — ACETAMINOPHEN 325 MG PO TABS: 650.0000 mg | ORAL_TABLET | ORAL | Status: DC | PRN

## 2017-08-08 MED ORDER — PROPOFOL 10 MG/ML IV BOLUS
INTRAVENOUS | Status: AC
  Filled 2017-08-08: qty 40

## 2017-08-08 MED ORDER — ACETAMINOPHEN 325 MG PO TABS
650.0000 mg | ORAL_TABLET | ORAL | Status: DC | PRN
  Administered 2017-08-08: 650 mg via ORAL
  Filled 2017-08-08: qty 2

## 2017-08-08 MED ORDER — HYDROMORPHONE HCL 2 MG PO TABS: 2.0000 mg | ORAL_TABLET | ORAL | 0 refills | Status: DC | PRN

## 2017-08-08 MED ORDER — BELLADONNA-OPIUM 16.2-30 MG RE SUPP
RECTAL | Status: AC
  Filled 2017-08-08: qty 1

## 2017-08-08 MED ORDER — LIDOCAINE 2% (20 MG/ML) 5 ML SYRINGE
INTRAMUSCULAR | Status: DC | PRN
  Administered 2017-08-08: 100 mg via INTRAVENOUS

## 2017-08-08 MED ORDER — MAGNESIUM OXIDE 400 MG PO TABS
400.0000 mg | ORAL_TABLET | Freq: Every day | ORAL | Status: DC
Start: 2017-08-08 — End: 2017-08-08

## 2017-08-08 MED ORDER — NITROGLYCERIN 0.4 MG SL SUBL: 0.4000 mg | SUBLINGUAL_TABLET | SUBLINGUAL | Status: DC | PRN

## 2017-08-08 MED ORDER — IPRATROPIUM BROMIDE 0.02 % IN SOLN: 0.5000 mg | Freq: Four times a day (QID) | RESPIRATORY_TRACT | Status: DC | PRN

## 2017-08-08 MED ORDER — BUDESONIDE 0.25 MG/2ML IN SUSP
0.2500 mg | Freq: Two times a day (BID) | RESPIRATORY_TRACT | Status: DC
  Administered 2017-08-08 – 2017-08-11 (×6): 0.25 mg via RESPIRATORY_TRACT
  Filled 2017-08-08 (×6): qty 2

## 2017-08-08 MED ORDER — MAGNESIUM OXIDE 400 (241.3 MG) MG PO TABS
400.0000 mg | ORAL_TABLET | Freq: Every day | ORAL | Status: DC
  Administered 2017-08-08 – 2017-08-11 (×4): 400 mg via ORAL
  Filled 2017-08-08 (×4): qty 1

## 2017-08-08 MED ORDER — LIDOCAINE HCL 2 % EX GEL
CUTANEOUS | Status: AC
  Filled 2017-08-08: qty 5

## 2017-08-08 MED ORDER — SODIUM CHLORIDE 0.9 % IJ SOLN
INTRAMUSCULAR | Status: AC
  Filled 2017-08-08: qty 10

## 2017-08-08 MED ORDER — ALPRAZOLAM 0.5 MG PO TABS: 0.5000 mg | ORAL_TABLET | Freq: Three times a day (TID) | ORAL | Status: DC | PRN

## 2017-08-08 MED ORDER — SUGAMMADEX SODIUM 200 MG/2ML IV SOLN
INTRAVENOUS | Status: DC | PRN
  Administered 2017-08-08: 200 mg via INTRAVENOUS

## 2017-08-08 MED ORDER — BISACODYL 10 MG RE SUPP: 10.0000 mg | Freq: Every day | RECTAL | Status: DC | PRN

## 2017-08-08 MED ORDER — SUGAMMADEX SODIUM 200 MG/2ML IV SOLN
INTRAVENOUS | Status: AC
  Filled 2017-08-08: qty 2

## 2017-08-08 MED ORDER — FLEET ENEMA 7-19 GM/118ML RE ENEM: 1.0000 | ENEMA | Freq: Once | RECTAL | Status: DC | PRN

## 2017-08-08 MED ORDER — MIDAZOLAM HCL 2 MG/2ML IJ SOLN
INTRAMUSCULAR | Status: AC
  Filled 2017-08-08: qty 2

## 2017-08-08 MED ORDER — GABAPENTIN 300 MG PO CAPS
300.0000 mg | ORAL_CAPSULE | Freq: Three times a day (TID) | ORAL | Status: DC
  Administered 2017-08-08 – 2017-08-11 (×10): 300 mg via ORAL
  Filled 2017-08-08 (×10): qty 1

## 2017-08-08 MED ORDER — HYDROCODONE-ACETAMINOPHEN 5-325 MG PO TABS
1.0000 | ORAL_TABLET | Freq: Four times a day (QID) | ORAL | Status: DC | PRN
  Administered 2017-08-09: 1 via ORAL
  Filled 2017-08-08: qty 1

## 2017-08-08 MED ORDER — SODIUM CHLORIDE 0.9 % IV BOLUS (SEPSIS)
500.0000 mL | Freq: Once | INTRAVENOUS | Status: AC
  Administered 2017-08-08: 500 mL via INTRAVENOUS

## 2017-08-08 MED ORDER — MIDAZOLAM HCL 5 MG/5ML IJ SOLN
INTRAMUSCULAR | Status: DC | PRN
  Administered 2017-08-08: 2 mg via INTRAVENOUS

## 2017-08-08 MED ORDER — INSULIN ASPART 100 UNIT/ML ~~LOC~~ SOLN
0.0000 [IU] | Freq: Three times a day (TID) | SUBCUTANEOUS | Status: DC
  Administered 2017-08-08: 7 [IU] via SUBCUTANEOUS
  Administered 2017-08-09 – 2017-08-10 (×3): 4 [IU] via SUBCUTANEOUS
  Administered 2017-08-10 (×2): 3 [IU] via SUBCUTANEOUS
  Administered 2017-08-11: 4 [IU] via SUBCUTANEOUS
  Administered 2017-08-11: 7 [IU] via SUBCUTANEOUS

## 2017-08-08 MED ORDER — ALBUTEROL SULFATE (2.5 MG/3ML) 0.083% IN NEBU
2.5000 mg | INHALATION_SOLUTION | Freq: Four times a day (QID) | RESPIRATORY_TRACT | Status: DC | PRN
  Administered 2017-08-09: 2.5 mg via RESPIRATORY_TRACT
  Filled 2017-08-08: qty 3

## 2017-08-08 MED ORDER — MEPERIDINE HCL 50 MG/ML IJ SOLN
6.2500 mg | Freq: Once | INTRAMUSCULAR | Status: AC
  Administered 2017-08-08: 6.25 mg via INTRAVENOUS

## 2017-08-08 MED ORDER — ALBUTEROL SULFATE (2.5 MG/3ML) 0.083% IN NEBU: 2.5000 mg | INHALATION_SOLUTION | RESPIRATORY_TRACT | Status: DC | PRN

## 2017-08-08 MED ORDER — LOSARTAN POTASSIUM 50 MG PO TABS: 100.0000 mg | ORAL_TABLET | Freq: Every day | ORAL | Status: DC

## 2017-08-08 MED ORDER — SODIUM CHLORIDE 0.9% FLUSH: 3.0000 mL | Freq: Two times a day (BID) | INTRAVENOUS | Status: DC

## 2017-08-08 MED ORDER — FLUTICASONE PROPIONATE 50 MCG/ACT NA SUSP
1.0000 | Freq: Every day | NASAL | Status: DC
  Administered 2017-08-09 – 2017-08-11 (×3): 1 via NASAL
  Filled 2017-08-08: qty 16

## 2017-08-08 MED ORDER — ACETAMINOPHEN 650 MG RE SUPP
650.0000 mg | RECTAL | Status: DC | PRN
  Filled 2017-08-08: qty 1

## 2017-08-08 MED ORDER — POTASSIUM CHLORIDE IN NACL 20-0.45 MEQ/L-% IV SOLN
INTRAVENOUS | Status: DC
  Administered 2017-08-08: 16:00:00 via INTRAVENOUS
  Filled 2017-08-08 (×2): qty 1000

## 2017-08-08 MED ORDER — ONDANSETRON HCL 4 MG/2ML IJ SOLN: 4.0000 mg | INTRAMUSCULAR | Status: DC | PRN

## 2017-08-08 MED ORDER — AMPICILLIN 500 MG PO CAPS: 500.0000 mg | ORAL_CAPSULE | Freq: Every day | ORAL | Status: DC

## 2017-08-08 MED ORDER — GENTAMICIN SULFATE 40 MG/ML IJ SOLN
400.0000 mg | INTRAVENOUS | Status: AC
  Administered 2017-08-08: 400 mg via INTRAVENOUS
  Filled 2017-08-08: qty 10

## 2017-08-08 MED ORDER — AMLODIPINE BESYLATE 10 MG PO TABS
10.0000 mg | ORAL_TABLET | Freq: Every evening | ORAL | Status: DC
  Filled 2017-08-08: qty 1

## 2017-08-08 MED ORDER — OXYCODONE HCL 5 MG PO TABS
5.0000 mg | ORAL_TABLET | ORAL | Status: DC | PRN
  Administered 2017-08-08 – 2017-08-09 (×2): 5 mg via ORAL
  Filled 2017-08-08 (×2): qty 1

## 2017-08-08 MED ORDER — OXYCODONE HCL 5 MG PO TABS: 5.0000 mg | ORAL_TABLET | ORAL | Status: DC | PRN

## 2017-08-08 MED ORDER — LIDOCAINE 2% (20 MG/ML) 5 ML SYRINGE
INTRAMUSCULAR | Status: AC
  Filled 2017-08-08: qty 5

## 2017-08-08 MED ORDER — LACTATED RINGERS IV SOLN
INTRAVENOUS | Status: DC
  Administered 2017-08-08: 1000 mL via INTRAVENOUS

## 2017-08-08 MED ORDER — SODIUM CHLORIDE 0.9% FLUSH: 3.0000 mL | INTRAVENOUS | Status: DC | PRN

## 2017-08-08 MED ORDER — INSULIN ASPART 100 UNIT/ML ~~LOC~~ SOLN: 0.0000 [IU] | Freq: Every day | SUBCUTANEOUS | Status: DC

## 2017-08-08 MED ORDER — PHENYLEPHRINE 40 MCG/ML (10ML) SYRINGE FOR IV PUSH (FOR BLOOD PRESSURE SUPPORT)
PREFILLED_SYRINGE | INTRAVENOUS | Status: DC | PRN
  Administered 2017-08-08 (×2): 40 ug via INTRAVENOUS
  Administered 2017-08-08: 80 ug via INTRAVENOUS
  Administered 2017-08-08: 40 ug via INTRAVENOUS
  Administered 2017-08-08 (×2): 80 ug via INTRAVENOUS
  Administered 2017-08-08: 40 ug via INTRAVENOUS

## 2017-08-08 MED ORDER — VANCOMYCIN HCL 10 G IV SOLR
1500.0000 mg | Freq: Once | INTRAVENOUS | Status: AC
  Administered 2017-08-08: 1500 mg via INTRAVENOUS
  Filled 2017-08-08: qty 1500

## 2017-08-08 MED ORDER — ROCURONIUM BROMIDE 50 MG/5ML IV SOSY
PREFILLED_SYRINGE | INTRAVENOUS | Status: AC
  Filled 2017-08-08: qty 5

## 2017-08-08 MED ORDER — SODIUM CHLORIDE 0.9 % IR SOLN
Status: DC | PRN
  Administered 2017-08-08 (×4): 1000 mL via INTRAVESICAL
  Administered 2017-08-08: 3000 mL via INTRAVESICAL

## 2017-08-08 MED ORDER — PROPOFOL 10 MG/ML IV BOLUS
INTRAVENOUS | Status: DC | PRN
  Administered 2017-08-08: 250 mg via INTRAVENOUS

## 2017-08-08 SURGICAL SUPPLY — 22 items
BAG URO CATCHER STRL LF (MISCELLANEOUS) ×2 IMPLANT
BASKET STONE NCOMPASS (UROLOGICAL SUPPLIES) IMPLANT
CATH URET 5FR 28IN OPEN ENDED (CATHETERS) ×2 IMPLANT
CATH URET DUAL LUMEN 6-10FR 50 (CATHETERS) IMPLANT
CLOTH BEACON ORANGE TIMEOUT ST (SAFETY) ×2 IMPLANT
COVER SURGICAL LIGHT HANDLE (MISCELLANEOUS) IMPLANT
EXTRACTOR STONE NITINOL NGAGE (UROLOGICAL SUPPLIES) ×2 IMPLANT
FIBER LASER FLEXIVA 1000 (UROLOGICAL SUPPLIES) IMPLANT
FIBER LASER FLEXIVA 365 (UROLOGICAL SUPPLIES) IMPLANT
FIBER LASER FLEXIVA 550 (UROLOGICAL SUPPLIES) IMPLANT
FIBER LASER TRAC TIP (UROLOGICAL SUPPLIES) ×2 IMPLANT
GLOVE SURG SS PI 8.0 STRL IVOR (GLOVE) IMPLANT
GOWN STRL REUS W/TWL XL LVL3 (GOWN DISPOSABLE) ×2 IMPLANT
GUIDEWIRE STR DUAL SENSOR (WIRE) ×2 IMPLANT
IV NS 1000ML (IV SOLUTION)
IV NS 1000ML BAXH (IV SOLUTION) IMPLANT
IV NS IRRIG 3000ML ARTHROMATIC (IV SOLUTION) ×2 IMPLANT
MANIFOLD NEPTUNE II (INSTRUMENTS) ×2 IMPLANT
PACK CYSTO (CUSTOM PROCEDURE TRAY) ×2 IMPLANT
SHEATH ACCESS URETERAL 38CM (SHEATH) ×2 IMPLANT
STENT URET 6FRX24 CONTOUR (STENTS) ×2 IMPLANT
TUBING CONNECTING 10 (TUBING) ×2 IMPLANT

## 2017-08-08 NOTE — Anesthesia Preprocedure Evaluation (Addendum)
Anesthesia Evaluation  Patient identified by MRN, date of birth, ID band Patient awake    Reviewed: Allergy & Precautions, NPO status , Patient's Chart, lab work & pertinent test results  Airway Mallampati: II  TM Distance: >3 FB     Dental   Pulmonary shortness of breath, asthma , sleep apnea , COPD, former smoker,    breath sounds clear to auscultation       Cardiovascular hypertension, + angina + Peripheral Vascular Disease  + Valvular Problems/Murmurs  Rhythm:Regular Rate:Normal     Neuro/Psych  Headaches,    GI/Hepatic Neg liver ROS, GERD  ,  Endo/Other  diabetes  Renal/GU Renal diseasenegative Renal ROS     Musculoskeletal   Abdominal   Peds  Hematology   Anesthesia Other Findings   Reproductive/Obstetrics                             Anesthesia Physical Anesthesia Plan  ASA: III  Anesthesia Plan: General   Post-op Pain Management:    Induction: Intravenous  PONV Risk Score and Plan: 3 and Ondansetron, Dexamethasone, Midazolam, Propofol infusion and Treatment may vary due to age or medical condition  Airway Management Planned: Oral ETT  Additional Equipment:   Intra-op Plan:   Post-operative Plan: Extubation in OR  Informed Consent: I have reviewed the patients History and Physical, chart, labs and discussed the procedure including the risks, benefits and alternatives for the proposed anesthesia with the patient or authorized representative who has indicated his/her understanding and acceptance.   Dental advisory given  Plan Discussed with: CRNA and Anesthesiologist  Anesthesia Plan Comments:         Anesthesia Quick Evaluation

## 2017-08-08 NOTE — Progress Notes (Signed)
Pharmacy Antibiotic Note  Sara Tran is a 61 y.o. female admitted on 08/08/2017 with fever following urological procedure.  Pharmacy has been consulted for vancomycin and zosyn dosing. Plan was home after procedure but being admitted for observation. Code sepsis initiated  She was given gentamicin 400mg  IV x1 preop 8/9 at 07:47 - she was resumed on her home ampicillin 500mg  PO qhs  Today, 08/08/2017  Renal: Preop SCr = 1.26, increased to 1.72 (norm CrCl = 67ml/min)  WBC elevated  Tm = 102  LA increased    Plan:  Vancomycin 1500mg  IV q24h  Zosyn 3.375gm IV q8h over 4h infusion   Daily SCr d/t data suggesting increased nephrotoxicity with concomitant vancomycin and zosyn   Suggest alternative regimen to minimize nephrotoxicity if to continue > 48h  Height: 4\' 10"  (147.3 cm) Weight: 275 lb 6 oz (124.9 kg) IBW/kg (Calculated) : 40.9  Adjusted BW: 75kg  Temp (24hrs), Avg:99.6 F (37.6 C), Min:97.5 F (36.4 C), Max:102 F (38.9 C)  No results for input(s): WBC, CREATININE, LATICACIDVEN, VANCOTROUGH, VANCOPEAK, VANCORANDOM, GENTTROUGH, GENTPEAK, GENTRANDOM, TOBRATROUGH, TOBRAPEAK, TOBRARND, AMIKACINPEAK, AMIKACINTROU, AMIKACIN in the last 168 hours.  Estimated Creatinine Clearance: 56.5 mL/min (A) (by C-G formula based on SCr of 1.26 mg/dL (H)).    Allergies  Allergen Reactions  . Bee Venom Anaphylaxis    As a child  . Ancef [Cefazolin] Other (See Comments)    Patient was told she was allergic but doesn't remember what happens  . Aspirin Other (See Comments)    Rectal bleeding  . Bactrim [Sulfamethoxazole-Trimethoprim] Other (See Comments)    Cramping,bloating,vomiting,fever,headaches  . Ceftin [Cefuroxime Axetil] Swelling    Swollen tongue, "made me crazy"  . Ciprofloxacin Cough    Coughed up blood  . Macrodantin [Nitrofurantoin] Other (See Comments)    Vomited blood and had dark stool.  . Milk-Related Compounds Other (See Comments)    IBS  . Other     Snake  venom - not sure of reaction -had problems with feet   . Latex Itching and Rash    Antimicrobials this admission: 8/9 gentamicin x1 8/9 zosyn >> 8/9 vancomycin >>  Dose adjustments this admission:  Microbiology results: 8/10 BCx 8/10 UCx  Thank you for allowing pharmacy to be a part of this patient's care.  Doreene Eland, PharmD, BCPS.   Pager: [phone] 08/08/2017 3:21 PM

## 2017-08-08 NOTE — Anesthesia Postprocedure Evaluation (Signed)
Anesthesia Post Note  Patient: Sara Tran  Procedure(s) Performed: Procedure(s) (LRB): RIGHT URETEROSCOPY WITH HOLMIUM LASER RIGHT STENT PLACEMENT (Right)     Patient location during evaluation: PACU Anesthesia Type: General Level of consciousness: awake Pain management: pain level controlled Vital Signs Assessment: post-procedure vital signs reviewed and stable Respiratory status: spontaneous breathing Cardiovascular status: stable Postop Assessment: no signs of nausea or vomiting Anesthetic complications: no    Last Vitals:  Vitals:   08/08/17 1100 08/08/17 1141  BP: (!) 173/67 (!) 160/76  Pulse: 98 (!) 101  Resp: 16 16  Temp: 37.7 C 36.8 C  SpO2: 94% 96%    Last Pain:  Vitals:   08/08/17 1141  TempSrc: Oral  PainSc:                  [name]

## 2017-08-08 NOTE — Progress Notes (Signed)
CRITICAL VALUE ALERT  Critical Value:  Lactic acid 3.2  Date & Time Notied:  08/08/2017 7:22 PM  Provider Notified: Dr. Myna Hidalgo  Orders Received/Actions taken: awaiting orders

## 2017-08-08 NOTE — Progress Notes (Signed)
Day of Surgery  Subjective: CC: Chills  Hx: Sara Tran had right ureteroscopy today with preop ampicillin for suppression and gentamycin for the procedure,   She has multiple abx allergies.   The procedure was uneventful but she developed some shakiness post op and was kept for further observation and then developed fever to 101.8 with hypertension and tachycardia.   She now has some confusion.    ROS:  Review of Systems  Unable to perform ROS: Mental acuity  Constitutional: Positive for chills and fever.    Anti-infectives: Anti-infectives    Start     Dose/Rate Route Frequency Ordered Stop   08/08/17 2200  ampicillin (PRINCIPEN) capsule 500 mg     500 mg Oral Daily at bedtime 08/08/17 1500 08/30/17 2159   08/08/17 0721  gentamicin (GARAMYCIN) 400 mg in dextrose 5 % 100 mL IVPB     400 mg 220 mL/hr over 30 Minutes Intravenous 30 min pre-op 08/08/17 6967 08/08/17 8938      Current Facility-Administered Medications  Medication Dose Route Frequency Provider Last Rate Last Dose  . 0.45 % NaCl with KCl 20 mEq / L infusion   Intravenous Continuous Irine Seal, MD      . acetaminophen (TYLENOL) tablet 650 mg  650 mg Oral Q4H PRN Irine Seal, MD      . albuterol (PROVENTIL) (2.5 MG/3ML) 0.083% nebulizer solution 2.5 mg  2.5 mg Nebulization Q6H PRN Irine Seal, MD      . ALPRAZolam Duanne Moron) tablet 0.5 mg  0.5 mg Oral TID PRN Irine Seal, MD      . amLODipine (NORVASC) tablet 10 mg  10 mg Oral QPM Irine Seal, MD      . ampicillin (PRINCIPEN) capsule 500 mg  500 mg Oral QHS Irine Seal, MD      . bisacodyl (DULCOLAX) suppository 10 mg  10 mg Rectal Daily PRN Irine Seal, MD      . budesonide (PULMICORT) nebulizer solution 0.25 mg  0.25 mg Nebulization BID Irine Seal, MD      . fenofibrate tablet 160 mg  160 mg Oral Daily Irine Seal, MD      . fentaNYL (SUBLIMAZE) 100 MCG/2ML injection           . [START ON 08/09/2017] fluticasone (FLONASE) 50 MCG/ACT nasal spray 1 spray  1 spray Each Nare  Daily Irine Seal, MD      . gabapentin (NEURONTIN) capsule 300 mg  300 mg Oral TID Irine Seal, MD      . HYDROcodone-acetaminophen (NORCO/VICODIN) 5-325 MG per tablet 1 tablet  1 tablet Oral Q6H PRN Irine Seal, MD      . HYDROmorphone (DILAUDID) injection 0.5-1 mg  0.5-1 mg Intravenous Q2H PRN Irine Seal, MD      . insulin aspart (novoLOG) injection 0-20 Units  0-20 Units Subcutaneous TID WC Irine Seal, MD      . insulin aspart (novoLOG) injection 0-5 Units  0-5 Units Subcutaneous QHS Irine Seal, MD      . ipratropium (ATROVENT) nebulizer solution 0.5 mg  0.5 mg Nebulization Q6H PRN Irine Seal, MD      . Derrill Memo ON 08/09/2017] isosorbide mononitrate (IMDUR) 24 hr tablet 30 mg  30 mg Oral Daily Irine Seal, MD      . Derrill Memo ON 08/09/2017] loratadine (CLARITIN) tablet 10 mg  10 mg Oral Daily Irine Seal, MD      . Derrill Memo ON 08/09/2017] losartan (COZAAR) tablet 100 mg  100 mg Oral Daily Irine Seal, MD      .  magnesium oxide (MAG-OX) tablet 400 mg  400 mg Oral Daily Irine Seal, MD      . meperidine (DEMEROL) 50 MG/ML injection           . [START ON 08/09/2017] metFORMIN (GLUCOPHAGE-XR) 24 hr tablet 500 mg  500 mg Oral Q breakfast Irine Seal, MD      . nitroGLYCERIN (NITROSTAT) SL tablet 0.4 mg  0.4 mg Sublingual Q5 min PRN Irine Seal, MD      . ondansetron [facility]) injection 4 mg  4 mg Intravenous Q4H PRN Irine Seal, MD      . oxybutynin (DITROPAN) tablet 5 mg  5 mg Oral TID PRN Irine Seal, MD      . oxyCODONE (Oxy IR/ROXICODONE) immediate release tablet 5 mg  5 mg Oral Q4H PRN Irine Seal, MD      . potassium chloride SA (K-DUR,KLOR-CON) CR tablet 20 mEq  20 mEq Oral q morning - 10a Irine Seal, MD      . senna-docusate (Senokot-S) tablet 1 tablet  1 tablet Oral QHS PRN Irine Seal, MD      . sodium chloride 0.9 % injection           . sodium phosphate (FLEET) 7-19 GM/118ML enema 1 enema  1 enema Rectal Once PRN Irine Seal, MD      . tiZANidine (ZANAFLEX) tablet 4 mg  4 mg Oral Q8H PRN  Irine Seal, MD         Objective: Vital signs in last 24 hours: Temp:  [97.5 F (36.4 C)-102 F (38.9 C)] 102 F (38.9 C) (08/09 1501) Pulse Rate:  [84-112] 111 (08/09 1501) Resp:  [16-24] 24 (08/09 1501) BP: (135-178)/(61-92) 178/92 (08/09 1501) SpO2:  [93 %-100 %] 93 % (08/09 1501) Weight:  [124.9 kg (275 lb 6 oz)] 124.9 kg (275 lb 6 oz) (08/09 0526)  Intake/Output from previous day: No intake/output data recorded. Intake/Output this shift: Total I/O In: 2110 [I.V.:2000; IV Piggyback:110] Out: 25 [Blood:25]   Physical Exam  Lab Results:  No results for input(s): WBC, HGB, HCT, PLT in the last 72 hours. BMET No results for input(s): NA, K, CL, CO2, GLUCOSE, BUN, CREATININE, CALCIUM in the last 72 hours. PT/INR No results for input(s): LABPROT, INR in the last 72 hours. ABG No results for input(s): PHART, HCO3 in the last 72 hours.  Invalid input(s): PCO2, PO2  Studies/Results: Dg C-arm 61-120 Min-no Report  Result Date: 08/08/2017 Fluoroscopy was utilized by the requesting physician.  No radiographic interpretation.     Assessment and Plan: S/P right second look ureteroscopy with fever, confusion and possible sepsis.   I have initiated the sepsis protocol and broadened the antibiotic coverage with the addition of Vancomycin to the Gentamycin.  She was on ampicillin for suppression prior to the procedure for her infection stones.   She has multiple comorbidities and  I will consult the hospitalist service.         LOS: 0 days    Sara Tran 08/08/[phone] ID: Sara Tran, female   DOB: May 22, 1956, 61 y.o.   MRN: [id number]

## 2017-08-08 NOTE — Consult Note (Signed)
[facility] Medical Consultation  Sara Tran TKZ:[id number] DOB: 1956-10-02 DOA: 08/08/2017 PCP: Sharilyn Sites, MD   Requesting physician: Dr. Jeffie Pollock Date of consultation: 08/08/2017 Reason for consultation: Sepsis, DM, HTN, OSA  HPI:  61 year old female with history of morbid obesity, obstructive sleep apnea on CPAP, hypertension, prior history of lap banding in 2011, diabetes, pulmonary hypertension, hyperlipidemia, chronic kidney disease, who is being admitted to urology service for a right staghorn stone status post right cystoscopy with removal of right double-J stent, right second-look ureteroscopy with laser tripsy, and insertion of right double-J stent by Dr. Jeffie Pollock.  Following the procedure, patient has been febrile to 102, tachycardic, more somnolent and thus was directed for admission.  The hospitalist team was consulted to evaluate for sepsis as well as to evaluate for her underlying diabetes, hypertension, and all her other medical problems.  On my evaluation, patient is alert, mildly confused but answers orientation questions easily.  She complains of chest pain and abdominal pain have been there for quite some time even at home.  States that every time she does some excess activity (lifting something heavy or moving around) she gets chest pain which is relieved by rest.  She complains currently of having chills.  She denies any nausea or vomiting.  She denies any diarrhea.  She has no lightheadedness or dizziness.  Shortly after admission on the floor she underwent sepsis protocol with stat lab work which shows an elevated white count of 19, elevated lactic acid 2.5 and a pro calcitonin of 4.0.  Her creatinine was 1.7, slightly elevated from a baseline of around 1.3  Review of Systems:  As per HPI, otherwise 10 point review of systems negative   Impression/Recommendations Active Problems:   Chronic diastolic heart failure (HCC)   Morbid obesity ([place])   Diabetes mellitus  ([place])   Chest pain   Acute on chronic renal insufficiency   Sepsis due to urinary tract infection ([place])   Staghorn kidney stones    1. Sepsis due to urinary tract infection -most likely cause for her septic-like picture of the urologic intervention.  Check a urinalysis, urine cultures are pending.  Provide broad-spectrum coverage with vancomycin and Zosyn.  She is allergic to several antibiotics however has been able tolerate Zosyn as documented in May 2018. She has been started on sepsis protocol, give IV fluids, lactic acid is mildly elevated. 2. Diabetes mellitus -most recent hemoglobin A1c was 6.8 on July 2018.  Hold home oral agents and place patient on sliding scale insulin 3. Acute on chronic renal failure, underlying chronic kidney disease stage III - IV fluids as per sepsis protocol, repeat renal function in the morning 4. Chest pain -obtain an EKG, cycle cardiac enzymes. 5. Chronic diastolic CHF -appears euvolemic currently, careful watch fluid status in the setting of fluid resuscitation with sepsis 6. Obstructive sleep apnea -ordered nightly CPAP   I will followup again tomorrow. Please contact me if I can be of assistance in the meanwhile. Thank you for this consultation.   Past Medical History:  Diagnosis Date  . Adnexal cyst    Right simple cyst seen on [place]  . Anxiety   . Arthritis   . Asthma   . Atrial fibrillation (HCC)    hx of   . Chronic back pain   . Chronic hip pain   . COPD (chronic obstructive pulmonary disease) ([place])   . Dyspnea   . GERD (gastroesophageal reflux disease)   . Headache  due to pinched nerve damaage   . Heart murmur    hx of slight murmur   . Herpes simplex virus (HSV) infection   . History of cardiac catheterization    Normal coronaries 2013  . History of kidney stones   . Hot flashes   . Hypertension   . Hypertriglyceridemia   . IBS (irritable bowel syndrome)   . Internal hemorrhoids   . Morbid obesity ([place])    s/p lap band  surgery  . Obesity   . Pulmonary HTN ([place])    [place] 2008:  RA pressures 13/10 with a mean of 7 mmHg.  RV pressure 16/96 with end diastolic pressure of 15 mmHg.  PA pressure 49/23 with a mean of 35 mmHg.  Pulmonary capillary wedge 17/50 with a mean of 14 mmHg. The PA saturation was 68%.  RA saturation was 71% and aortic saturation was 90%.  Cardiac output was 6.0 with a cardiac index of 2.80 by Fick.  Normal coronaries by cath 2008.  Marland Kitchen Renal insufficiency   . Sciatica of right side   . Sleep apnea    CPAP use does not know settings   . Type 2 diabetes mellitus ([place])    type II   . Uterine fibroid    Past Surgical History:  Procedure Laterality Date  . CARDIAC CATHETERIZATION  07/2007, 05/2012   Normal coronary arteries  . COLONOSCOPY  10/18/10   SLF:2-mm sessile sigmoid polyp/diverticula  . CYSTOSCOPY/URETEROSCOPY/HOLMIUM LASER/STENT PLACEMENT Right 08/01/2017   Procedure: CYSTOSCOPY/ RIGHT URETEROSCOPY/ RIGHT RETROGRADE/ HOLMIUM LASER  AND RIGHT STENT PLACEMENT FIRST STAGE;  Surgeon: Irine Seal, MD;  Location: [facility];  Service: Urology;  Laterality: Right;  . HEMORRHOID BANDING  07/2012   Dr. Oneida Alar  . LAPAROSCOPIC CHOLECYSTECTOMY  1985  . LAPAROSCOPIC GASTRIC BANDING  12/12/2010  . LEFT AND RIGHT HEART CATHETERIZATION WITH CORONARY ANGIOGRAM N/A 06/03/2012   Procedure: LEFT AND RIGHT HEART CATHETERIZATION WITH CORONARY ANGIOGRAM;  Surgeon: Jolaine Artist, MD;  Location[facility] C[facility] LAB;  Service: Cardiovascular;  Laterality: N/A;  . PILONIDAL CYST EXCISION  1974  . 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 TUMOR EXCISION  10/2011   Face   Social History:  reports that she quit smoking about 21 years ago. Her smoking use included Cigarettes. She has never used smokeless tobacco. She reports that she does not drink alcohol or use drugs.  Allergies  Allergen Reactions  . Bee Venom Anaphylaxis    As a child  . Ancef [Cefazolin] Other (See Comments)    Patient was told she was allergic but  doesn't remember what happens  . Aspirin Other (See Comments)    Rectal bleeding  . Bactrim [Sulfamethoxazole-Trimethoprim] Other (See Comments)    Cramping,bloating,vomiting,fever,headaches  . Ceftin [Cefuroxime Axetil] Swelling    Swollen tongue, "made me crazy"  . Ciprofloxacin Cough    Coughed up blood  . Macrodantin [Nitrofurantoin] Other (See Comments)    Vomited blood and had dark stool.  . Milk-Related Compounds Other (See Comments)    IBS  . Other     Snake venom - not sure of reaction -had problems with feet   . Latex Itching and Rash   Family History  Problem Relation Age of Onset  . Diabetes Mother   . Heart failure Mother   . Breast cancer Sister   . CAD Father   . Hypertension Father   . Cancer Paternal Grandfather   . Hypertension Paternal Grandmother   .  Stroke Paternal Grandmother   . Other Maternal Grandmother        tonsilitis  . Diabetes Maternal Grandfather   . Hypertension Maternal Grandfather   . Breast cancer Paternal Aunt     Prior to Admission medications   Medication Sig Start Date End Date Taking? Authorizing Provider  albuterol (PROVENTIL HFA;VENTOLIN HFA) 108 (90 BASE) MCG/ACT inhaler Inhale 2 puffs into the lungs every 6 (six) hours as needed. FOR SHORTNESS OF BREATH/WHEEZING   Yes [provider]  albuterol (PROVENTIL) (2.5 MG/3ML) 0.083% nebulizer solution Take 2.5 mg by nebulization every 4 (four) hours as needed for wheezing or shortness of breath.  09/19/15  Yes [provider]  ALPRAZolam Duanne Moron) 0.5 MG tablet Take 0.5 mg by mouth 3 (three) times daily as needed. For anxiety/sleep 11/24/14  Yes [provider]  amLODipine (NORVASC) 10 MG tablet Take 1 tablet by mouth every evening.  04/10/13  Yes [provider]  ampicillin (PRINCIPEN) 500 MG capsule Take 1 capsule (500 mg total) by mouth at bedtime. 08/01/17  Yes Irine Seal, MD  budesonide (PULMICORT) 180 MCG/ACT inhaler Inhale 2 puffs into the lungs 2  (two) times daily.   Yes [provider]  carisoprodol (SOMA) 350 MG tablet TAKE 1 TABLET 4 TIMES A DAY AS NEEDED FOR MUSCLE SPASM. 02/13/17  Yes Meredith Staggers, MD  Cholecalciferol (VITAMIN D3 PO) Take 1 capsule by mouth daily.   Yes [provider]  fenofibrate 160 MG tablet Take 160 mg by mouth daily. 07/21/15  Yes [provider]  fluticasone (FLONASE) 50 MCG/ACT nasal spray Place 1-2 sprays into the nose daily.    Yes [provider]  gabapentin (NEURONTIN) 300 MG capsule Take 300 mg by mouth 3 (three) times daily.   Yes [provider]  ipratropium (ATROVENT) 0.02 % nebulizer solution Take 0.5 mg by nebulization every 6 (six) hours as needed.  09/19/15  Yes [provider]  isosorbide mononitrate (IMDUR) 30 MG 24 hr tablet Take 30 mg by mouth every morning.  09/05/11  Yes [provider]  loratadine (CLARITIN) 10 MG tablet Take 1 tablet by mouth daily. 05/27/17  Yes [provider]  losartan (COZAAR) 100 MG tablet Take 100 mg by mouth every morning.    Yes [provider]  magnesium oxide (MAG-OX) 400 MG tablet Take 400 mg by mouth daily.   Yes [provider]  metFORMIN (GLUCOPHAGE-XR) 500 MG 24 hr tablet Take 1 tablet by mouth daily. 05/20/17  Yes [provider]  Multiple Vitamins-Minerals (ONE-A-DAY 50 PLUS PO) Take 1 tablet by mouth daily.   Yes [provider]  omega-3 acid ethyl esters (LOVAZA) 1 g capsule Take by mouth 2 (two) times daily.   Yes [provider]  oxybutynin (DITROPAN) 5 MG tablet Take 1 tablet (5 mg total) by mouth 3 (three) times daily as needed for bladder spasms. 08/01/17  Yes Irine Seal, MD  potassium chloride SA (K-DUR,KLOR-CON) 20 MEQ tablet Take 20 mEq by mouth every morning.    Yes [provider]  tiZANidine (ZANAFLEX) 4 MG tablet Take 4 mg by mouth every 8 (eight) hours as needed for muscle spasms.   Yes [provider]    HYDROcodone-acetaminophen (NORCO) 5-325 MG tablet Take 1 tablet by mouth every 6 (six) hours as needed for moderate pain. 08/01/17   Irine Seal, MD  HYDROmorphone (DILAUDID) 2 MG tablet Take 1 tablet (2 mg total) by mouth every 4 (four) hours as needed  for severe pain. 08/08/17 08/08/18  Irine Seal, MD  nitroGLYCERIN (NITROSTAT) 0.4 MG SL tablet Place 0.4 mg under the tongue every 5 (five) minutes as needed. Reported on 06/14/2016    [provider]   Physical Exam: Blood pressure (!) 178/92, pulse (!) 111, temperature (!) 102 F (38.9 C), temperature source Oral, resp. rate (!) 24, height 4\' 10"  (1.473 m), weight 124.9 kg (275 lb 6 oz), last menstrual period 06/06/2013, SpO2 96 %. Vitals:   08/08/17 1501 08/08/17 1523  BP: (!) 178/92   Pulse: (!) 111   Resp: (!) 24   Temp: (!) 102 F (38.9 C)   SpO2: 93% 96%     General:  NAD, drowsy  Eyes: PERRL, no scleral icterus  Neck: obese, no JVD  Cardiovascular: Regular rate and rhythm, tachycardic, no murmurs rubs or gallops.  No lower extremity edema.  2+ peripheral pulses.  Respiratory: Clear to auscultation bilaterally, no wheezing, no crackles  Abdomen: Soft, slight tenderness to palpation in epigastric area, bowel sounds positive.  No guarding or rebound.  Skin: No rashes, lesions  Musculoskeletal: No clubbing/cyanosis, normal muscle mass  Psychiatric: Alert and oriented 4, normal mood  Neurologic: Cranial nerves grossly intact, muscle strength 5 out of 5 in all 4 extremities   Labs on Admission:  Basic Metabolic Panel: No results for input(s): NA, K, CL, CO2, GLUCOSE, BUN, CREATININE, CALCIUM, MG, PHOS in the last 168 hours. Liver Function Tests: No results for input(s): AST, ALT, ALKPHOS, BILITOT, PROT, ALBUMIN in the last 168 hours. No results for input(s): LIPASE, AMYLASE in the last 168 hours. No results for input(s): AMMONIA in the last 168 hours. CBC: No results for input(s): WBC, NEUTROABS, HGB, HCT, MCV,  PLT in the last 168 hours. Cardiac Enzymes: No results for input(s): CKTOTAL, CKMB, CKMBINDEX, TROPONINI in the last 168 hours. BNP: Invalid input(s): POCBNP CBG:  Recent Labs Lab 08/08/17 0517 08/08/17 0949  GLUCAP 145* 130*    Radiological Exams on Admission: Dg Chest Port 1 View  Result Date: 08/08/2017 CLINICAL DATA:  Acute lower central chest pains today and shortness-of-breath. EXAM: PORTABLE CHEST 1 VIEW COMPARISON:  05/02/2017 FINDINGS: Lungs are hypoinflated with minimal prominence of the perihilar markings suggesting mild vascular congestion. Mild stable cardiomegaly. Remainder of the exam is unchanged. IMPRESSION: Stable cardiomegaly with suggestion mild vascular congestion. Electronically Signed   By: Marin Olp M.D.   On: 08/08/2017 15:44   Dg C-arm 61-120 Min-no Report  Result Date: 08/08/2017 Fluoroscopy was utilized by the requesting physician.  No radiographic interpretation.    EKG: Independently reviewed. Sinus rhythm, slight TWI in V4   [place] Board [facility] Pager [phone]  If 7PM-7AM, please contact night-coverage [email or web address] Password [email or web address] 08/08/2017, 3:56 PM

## 2017-08-08 NOTE — Interval H&P Note (Signed)
History and Physical Interval Note:  For second look ureteroscopy today.  She reports ongoing stent pain and needs something stronger for post op.   0.  08/08/2017 7:20 AM  Sara Tran  has presented today for surgery, with the diagnosis of RIGHT STAGHORN STONE  The various methods of treatment have been discussed with the patient and family. After consideration of risks, benefits and other options for treatment, the patient has consented to  Procedure(s): RIGHT URETEROSCOPY WITH HOLMIUM LASER RIGHT STENT PLACEMENT (Right) as a surgical intervention .  The patient's history has been reviewed, patient examined, no change in status, stable for surgery.  I have reviewed the patient's chart and labs.  Questions were answered to the patient's satisfaction.     [name] J

## 2017-08-08 NOTE — Op Note (Signed)
[name], [name] NO.:  [id number]  MEDICAL RECORD NO.:  [id number]  LOCATION:                                 FACILITY:  PHYSICIAN:  Marshall Cork. Jeffie Pollock, M.D.    DATE OF BIRTH:  [birth year]  DATE OF PROCEDURE:  08/08/2017 DATE OF DISCHARGE:                              OPERATIVE REPORT   PROCEDURE:  Cystoscopy with removal of right double-J stent, right second-look ureteroscopy with laser tripsy, and insertion of right double-J stent.  PREOPERATIVE DIAGNOSIS:  Right staghorn stone.  POSTOPERATIVE DIAGNOSIS:  Right staghorn stone.  SURGEON:  Marshall Cork. Jeffie Pollock, M.D.  ANESTHESIA:  General.  SPECIMEN:  Stone fragments.  DRAINS:  A 6-French x 24 cm Contour double-J stent with tether.  BLOOD LOSS:  Minimal.  COMPLICATIONS:  None.  INDICATIONS:  [name] is a 61 year old black female, who initially had a 3.5 cm partial staghorn stone and a nondilated right renal collecting system.  She is obese and percutaneous nephrolithotomy was felt to be problematic due to the skin to stone distance in the primary lithotripsy because of her stone burden and size as well.  It was felt that ureteroscopy was indicated.  She previously undergone a first-stage ureteroscopic procedure and a large portion of the stone in the upper and mid pole calices were lasered and removed some residual stone in the lower pole was not accessed, and she returns today for a second procedure.  FINDINGS OF PROCEDURE:  She was taken to the operating room.  She was given Garamycin and been on ampicillin for suppression.  General anesthetic was induced.  She was placed in lithotomy position, was fitted with PAS hose.  Her perineum and genitalia were prepped with Betadine solution.  She was draped in the usual sterile fashion.  Cystoscopy was performed using a 23-French scope and 30-degree lens. 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  Left ureteral orifice was unremarkable.  Right ureteral orifice had a stent.  Stent was grasped with a grasping forceps and pulled the urethral meatus and a Sensor guidewire was passed to the kidney.  The stent was then removed.  A 38 cm digital access sheath was assembled, inserted to just below the kidney.  The wire and inner core were removed.  The dual-lumen digital flexible was then passed to the kidney and on fluoroscopy, there appeared to be a fairly significant residual stone burden in the lower calices.  The 200 micron TracTip laser fiber was then passed through the scope and the stone, which was probably visualized was engaged with the laser initially on 0.5 watts and 20 hertz, but the power was eventually increased to 1 watt and the frequency to 50 hertz.  The stone was easily fragmented into small fragments.  I was able to deal with approximately two-thirds to three-fourths of the residual lower pole stone; however, there was 1 stone in a medial inferior calyx that I had considerable difficulty accessing and switched between the dual-lumen and single- lumen digital flexible scopes.  Eventually, I was able to get into the calyx, but could only fragment a portion of it in one  of the branches of the calyx.  The most medial component remained intact and was approximately 1 x 1.5 cm.  I attempted every maneuver.  I know to use and also placed the patient in Trendelenburg, but was not able to access this particular portion of the stone.  At this point, fragments were removed using a NGage basket.  There were some small residual fragments remained in the upper pole, but they were felt to be small enough to pass without difficulty.  Once the accessible stone had been fragmented and all significant fragments removed leaving only the very small 1-2 mm fragments, a guidewire was reinserted to the kidney and the scope and sheath were removed.  The cystoscope was then  inserted over the wire and a 6-French 24 cm Contour double-J stent with tether was passed to kidney under fluoroscopic guidance.  The wire was removed leaving good coil in the kidney and a good coil in the bladder.  The stent tether was tied close to the meatus and trimmed and tucked vaginally. The patient was taken down from lithotomy position.  Her anesthetic was reversed.  She was moved to recovery room in stable condition.  There were no complications.  She may need an attempt at lithotripsy to fragment her remaining stone.     Marshall Cork. Jeffie Pollock, M.D.   ______________________________ Marshall Cork. Jeffie Pollock, M.D.    JJW/MEDQ  D:  08/08/2017  T:  08/08/2017  Job:  [id number]

## 2017-08-08 NOTE — H&P (View-Only) (Signed)
CC: I have kidney stones.  HPI: Sara Tran is a 61 year-old female patient who is here for renal calculi.    Sara Tran returns today in f/u for her 3.5cm right partial staghorn stone found on CT in May when she was admitted for sepsis. She is morbidly obese and a poor candidate for PCNL or ESWL. She has no voiding symptoms or hematuria at this time. She has some back pain right > left. She has incontinence when she stands after attempting to void.      ALLERGIES: Ciprofloxacin Macrodantin    MEDICATIONS: Metformin Hcl 1,000 mg tablet 1 tablet PO Daily  Cozaar 1 PO Daily  Flonase Allergy Relief 1 PO Daily  Norvasc 1 PO Daily  Pulmicort 1 PO Daily  Tricor 1 PO Daily  Ventolin Hfa 1 PO Daily     GU PSH: None     PSH Notes: heart catheration 2008/2013   NON-GU PSH: Appendectomy (laparoscopic) - 1978 Bariatric surgery - 2011 Cholecystectomy (laparoscopic) - 1985    GU PMH: Kidney Failure Unspec      PMH Notes: heartfailure   NON-GU PMH: Anxiety Arrhythmia Arthritis Asthma Atrial Fibrillation Diabetes Type 2 Heartburn Hypercholesterolemia Hypertension Myocardial Infarction Seizure disorder Sleep Apnea    FAMILY HISTORY: Arthritis - Father, Mother Diabetes - Mother Heart Disease - Mother, Father Hypertension - Mother, Father Kidney Failure - Father Kidney Stones - Father   SOCIAL HISTORY: Marital Status: Single Current Smoking Status: Patient does not smoke anymore. Has not smoked since 07/19/1996. Smoked for 20 years. Smoked 2 packs per day.   Tobacco Use Assessment Completed: Used Tobacco in last 30 days? Does not drink anymore.  Drinks 1 caffeinated drink per day. 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    REVIEW OF SYSTEMS:    GU Review Female:   Patient reports frequent urination, hard to postpone urination, get up at night to urinate, and leakage of urine. Patient denies burning /pain with urination, stream starts and stops, trouble starting your  stream, have to strain to urinate, and being pregnant.  Gastrointestinal (Upper):   Patient reports nausea and vomiting. Patient denies indigestion/ heartburn.  Gastrointestinal (Lower):   Patient reports diarrhea. Patient denies constipation.  Constitutional:   Patient reports night sweats and fatigue. Patient denies fever and weight loss.  Skin:   Patient reports itching. Patient denies skin rash/ lesion.  Eyes:   Patient reports blurred vision. Patient denies double vision.  Ears/ Nose/ Throat:   Patient reports sore throat and sinus problems.   Hematologic/Lymphatic:   Patient denies swollen glands and easy bruising.  Cardiovascular:   Patient reports leg swelling and chest pains.   Respiratory:   Patient reports shortness of breath. Patient denies cough.  Endocrine:   Patient reports excessive thirst.   Musculoskeletal:   Patient reports back pain and joint pain.   Neurological:   Patient reports headaches and dizziness.   Psychologic:   Patient denies depression and anxiety.   VITAL SIGNS:      07/19/2017 11:48 AM  Weight 284 lb / 128.82 kg  Height 58 in / 147.32 cm  BP 152/70 mmHg  Pulse 85 /min  Temperature 98.2 F / 37 C  BMI 59.3 kg/m   MULTI-SYSTEM PHYSICAL EXAMINATION:    Constitutional: Obese. No physical deformities. Normally developed. Good grooming.   Neck: Neck symmetrical, not swollen. Normal tracheal position.  Respiratory: No labored breathing, no use of accessory muscles. CTA  Cardiovascular: Normal temperature,RRR without murmur  Gastrointestinal: Obese abdomen.  Musculoskeletal: Normal gait and station of head and neck.     PAST DATA REVIEWED:  Source Of History:  Patient  Urine Test Review:   Urinalysis  X-Ray Review: C.T. Abdomen/Pelvis: Reviewed Films. Reviewed Report. Discussed With Patient.     PROCEDURES:          Urinalysis - 81003 Dipstick Dipstick Cont'd  Specimen: Voided Bilirubin: Neg  Color: Yellow Ketones: Neg  Appearance: Cloudy Blood:  3+  Specific Gravity: 1.020 Protein: 1+  pH: 6.5 Urobilinogen: 0.2  Glucose: Neg Nitrites: Neg    Leukocyte Esterase: 2+    ASSESSMENT:      ICD-10 Details  1 GU:   Renal calculus - N20.0 She has a 3.2cm right partial staghorn stone and is too obese for ESWL or PCNL in my hands. I offered to refer her for PCNL at a tertiary care center on [place] an do staged ureteroscopy. She would like to proceed with ureteroscopy with the understanding that it may take 2-3 visits to the OR. I have reviewed the risks of bleeding, infection, ureteral injury with stricture, need for multiple procedures, thrombotic events and anesthetic complications.   2   Urinary Tract Inf, Unspec site - N39.0 Her UA shows potential infection today. I will send a micro and culture and treat accordingly.    PLAN:           Orders Labs Urinalysis w/Scope, Urine Culture          Schedule Return Visit/Planned Activity: Next Available Appointment - Schedule Surgery          Document Letter(s):  Created for Patient: Clinical Summary         Notes:   CC: Dr. Sharilyn Sites.

## 2017-08-08 NOTE — Progress Notes (Signed)
Pt has been complaining of shivers and was found to have a temperature>101 degrees.  She will be admitted for observation with cultures.   I will start Vanc and continue Gentamycin.

## 2017-08-08 NOTE — Progress Notes (Signed)
CRITICAL VALUE ALERT  Critical Value: lactic acid 2.5  Date & Time Notied:  08/08/2017 4:54 PM  Provider Notified: Renne Crigler  Orders Received/Actions taken: Pt. receiving bolus and IV antibiotics

## 2017-08-08 NOTE — Brief Op Note (Signed)
08/08/2017  9:43 AM  PATIENT:  Sara Tran  61 y.o. female  PRE-OPERATIVE DIAGNOSIS:  RIGHT STAGHORN STONE  POST-OPERATIVE DIAGNOSIS:  RIGHT STAGHORN STONE  PROCEDURE:  Procedure(s): SECOND LOOK RIGHT URETEROSCOPY WITH HOLMIUM LASER RIGHT STENT PLACEMENT (Right)  SURGEON:  Surgeon(s) and Role:    * Irine Seal, MD - Primary  PHYSICIAN ASSISTANT:   ASSISTANTS: none   ANESTHESIA:   general  EBL:  Total I/O In: 1000 [I.V.:1000] Out: -   BLOOD ADMINISTERED:none  DRAINS: 6 x 37fr right JJ stent   LOCAL MEDICATIONS USED:  NONE  SPECIMEN:  Source of Specimen:  stone fragments  DISPOSITION OF SPECIMEN:  to patient  COUNTS:  YES  TOURNIQUET:  * No tourniquets in log *  DICTATION: .Other Dictation: Dictation Number [id number]  PLAN OF CARE: Discharge to home after PACU  PATIENT DISPOSITION:  PACU - hemodynamically stable.   Delay start of Pharmacological VTE agent (>24hrs) due to surgical blood loss or risk of bleeding: not applicable

## 2017-08-08 NOTE — Progress Notes (Signed)
RN notified Dr. Jeffie Pollock patient current vital signs with oral tempurature 101.8.  Dr. Jeffie Pollock to enter orders for patient to be admitted today.

## 2017-08-08 NOTE — Progress Notes (Signed)
Patient ambulatory to restroom with 1 stand by assist.  Patient states she is not comfortable going home, unable to state why she is uncomfortable going home.  Patient void with no issues noted.  1 Assist to chair.  Vital signs stable.

## 2017-08-08 NOTE — Transfer of Care (Signed)
Immediate Anesthesia Transfer of Care Note  Patient: Sara Tran  Procedure(s) Performed: Procedure(s): RIGHT URETEROSCOPY WITH HOLMIUM LASER RIGHT STENT PLACEMENT (Right)  Patient Location: PACU  Anesthesia Type:General  Level of Consciousness: awake, alert , oriented and patient cooperative  Airway & Oxygen Therapy: Patient Spontanous Breathing and Patient connected to face mask oxygen  Post-op Assessment: Report given to RN, Post -op Vital signs reviewed and stable and Patient moving all extremities  Post vital signs: Reviewed and stable  Last Vitals:  Vitals:   08/08/17 0526  BP: (!) 156/79  Pulse: 97  Resp: 18  Temp: 37.2 C  SpO2: 100%    Last Pain:  Vitals:   08/08/17 0532  TempSrc:   PainSc: 6       Patients Stated Pain Goal: 6 (00/34/96 1164)  Complications: No apparent anesthesia complications

## 2017-08-08 NOTE — Anesthesia Procedure Notes (Signed)
Procedure Name: Intubation Date/Time: 08/08/2017 7:35 AM Performed by: Carleene Cooper A Pre-anesthesia Checklist: Patient identified, Emergency Drugs available, Suction available, Patient being monitored and Timeout performed Patient Re-evaluated:Patient Re-evaluated prior to induction Oxygen Delivery Method: Circle system utilized Preoxygenation: Pre-oxygenation with 100% oxygen Induction Type: IV induction Ventilation: Mask ventilation without difficulty and Oral airway inserted - appropriate to patient size Laryngoscope Size: Mac and 4 Grade View: Grade II Tube type: Oral Tube size: 7.0 mm Number of attempts: 1 Airway Equipment and Method: Stylet Placement Confirmation: ETT inserted through vocal cords under direct vision,  positive ETCO2 and breath sounds checked- equal and bilateral Secured at: 21 cm Tube secured with: Tape Dental Injury: Teeth and Oropharynx as per pre-operative assessment

## 2017-08-08 NOTE — Progress Notes (Signed)
PHARMACIST - PHYSICIAN COMMUNICATION DR:  Urology, [facility]  CONCERNING:  METFORMIN SAFE ADMINISTRATION POLICY  RECOMMENDATION: Metformin has been placed on DISCONTINUE (rejected order) STATUS and should be reordered only after any of the conditions below are ruled out.  Current Safety recommendations include avoiding for lactic acidosis (source likely sepsis)  Doreene Eland, PharmD, BCPS.   Pager: [phone] 08/08/2017 7:31 PM

## 2017-08-08 NOTE — Discharge Instructions (Addendum)
General Anesthesia, Adult, Care After These instructions provide you with information about caring for yourself after your procedure. Your health care provider may also give you more specific instructions. Your treatment has been planned according to current medical practices, but problems sometimes occur. Call your health care provider if you have any problems or questions after your procedure. What can I expect after the procedure? After the procedure, it is common to have:  Vomiting.  A sore throat.  Mental slowness.  It is common to feel:  Nauseous.  Cold or shivery.  Sleepy.  Tired.  Sore or achy, even in parts of your body where you did not have surgery.  Follow these instructions at home: For at least 24 hours after the procedure:  Do not: ? Participate in activities where you could fall or become injured. ? Drive. ? Use heavy machinery. ? Drink alcohol. ? Take sleeping pills or medicines that cause drowsiness. ? Make important decisions or sign legal documents. ? Take care of children on your own.  Rest. Eating and drinking  If you vomit, drink water, juice, or soup when you can drink without vomiting.  Drink enough fluid to keep your urine clear or pale yellow.  Make sure you have little or no nausea before eating solid foods.  Follow the diet recommended by your health care provider. General instructions  Have a responsible adult stay with you until you are awake and alert.  Return to your normal activities as told by your health care provider. Ask your health care provider what activities are safe for you.  Take over-the-counter and prescription medicines only as told by your health care provider.  If you smoke, do not smoke without supervision.  Keep all follow-up visits as told by your health care provider. This is important. Contact a health care provider if:  You continue to have nausea or vomiting at home, and medicines are not helpful.  You  cannot drink fluids or start eating again.  You cannot urinate after 8-12 hours.  You develop a skin rash.  You have fever.  You have increasing redness at the site of your procedure. Get help right away if:  You have difficulty breathing.  You have chest pain.  You have unexpected bleeding.  You feel that you are having a life-threatening or urgent problem. This information is not intended to replace advice given to you by your health care provider. Make sure you discuss any questions you have with your health care provider. Document Released: 03/25/2001 Document Revised: 05/21/2016 Document Reviewed: 12/01/2015 Elsevier Interactive Patient Education  2018 [place].   Ureteral Stent Implantation, Care After Refer to this sheet in the next few weeks. These instructions provide you with information about caring for yourself after your procedure. Your health care provider may also give you more specific instructions. Your treatment has been planned according to current medical practices, but problems sometimes occur. Call your health care provider if you have any problems or questions after your procedure. What can I expect after the procedure? After the procedure, it is common to have:  Nausea.  Mild pain when you urinate. You may feel this pain in your lower back or lower abdomen. Pain should stop within a few minutes after you urinate. This may last for up to 1 week.  A small amount of blood in your urine for several days.  Follow these instructions at home:  Medicines  Take over-the-counter and prescription medicines only as told by your health  care provider.  If you were prescribed an antibiotic medicine, take it as told by your health care provider. Do not stop taking the antibiotic even if you start to feel better.  Do not drive for 24 hours if you received a sedative.  Do not drive or operate heavy machinery while taking prescription pain  medicines. Activity  Return to your normal activities as told by your health care provider. Ask your health care provider what activities are safe for you.  Do not lift anything that is heavier than 10 lb (4.5 kg). Follow this limit for 1 week after your procedure, or for as long as told by your health care provider. General instructions  Watch for any blood in your urine. Call your health care provider if the amount of blood in your urine increases.  If you have a catheter: ? Follow instructions from your health care provider about taking care of your catheter and collection bag. ? Do not take baths, swim, or use a hot tub until your health care provider approves.  Drink enough fluid to keep your urine clear or pale yellow.  Keep all follow-up visits as told by your health care provider. This is important. Contact a health care provider if:  You have pain that gets worse or does not get better with medicine, especially pain when you urinate.  You have difficulty urinating.  You feel nauseous or you vomit repeatedly during a period of more than 2 days after the procedure. Get help right away if:  Your urine is dark red or has blood clots in it.  You are leaking urine (have incontinence).  The end of the stent comes out of your urethra.  You cannot urinate.  You have sudden, sharp, or severe pain in your abdomen or lower back.  You have a fever. This information is not intended to replace advice given to you by your health care provider. Make sure you discuss any questions you have with your health care provider. Document Released: 08/19/2013 Document Revised: 05/24/2016 Document Reviewed: 07/01/2015 Elsevier Interactive Patient Education  [employer].

## 2017-08-09 ENCOUNTER — Encounter (HOSPITAL_COMMUNITY): Payer: Self-pay | Admitting: Urology

## 2017-08-09 LAB — COMPREHENSIVE METABOLIC PANEL
ALT: 21 U/L (ref 14–54)
AST: 26 U/L (ref 15–41)
Albumin: 3.2 g/dL — ABNORMAL LOW (ref 3.5–5.0)
Alkaline Phosphatase: 36 U/L — ABNORMAL LOW (ref 38–126)
Anion gap: 7 (ref 5–15)
BUN: 15 mg/dL (ref 6–20)
CO2: 23 mmol/L (ref 22–32)
Calcium: 8.5 mg/dL — ABNORMAL LOW (ref 8.9–10.3)
Chloride: 109 mmol/L (ref 101–111)
Creatinine, Ser: 1.76 mg/dL — ABNORMAL HIGH (ref 0.44–1.00)
GFR calc Af Amer: 35 mL/min — ABNORMAL LOW (ref >60)
GFR calc non Af Amer: 31 mL/min — ABNORMAL LOW (ref >60)
Glucose, Bld: 209 mg/dL — ABNORMAL HIGH (ref 65–99)
Potassium: 4.8 mmol/L (ref 3.5–5.1)
Sodium: 139 mmol/L (ref 135–145)
Total Bilirubin: 0.8 mg/dL (ref 0.3–1.2)
Total Protein: 6.6 g/dL (ref 6.5–8.1)

## 2017-08-09 LAB — CBC
HCT: 31.5 % — ABNORMAL LOW (ref 36.0–46.0)
Hemoglobin: 10.3 g/dL — ABNORMAL LOW (ref 12.0–15.0)
MCH: 30.6 pg (ref 26.0–34.0)
MCHC: 32.7 g/dL (ref 30.0–36.0)
MCV: 93.5 fL (ref 78.0–100.0)
Platelets: 270 10*3/uL (ref 150–400)
RBC: 3.37 MIL/uL — ABNORMAL LOW (ref 3.87–5.11)
RDW: 13.1 % (ref 11.5–15.5)
WBC: 15.2 10*3/uL — ABNORMAL HIGH (ref 4.0–10.5)

## 2017-08-09 LAB — TROPONIN I: Troponin I: <0.03 ng/mL (ref <0.03)

## 2017-08-09 LAB — GLUCOSE, CAPILLARY
Glucose-Capillary: 155 mg/dL — ABNORMAL HIGH (ref 65–99)
Glucose-Capillary: 115 mg/dL — ABNORMAL HIGH (ref 65–99)
Glucose-Capillary: 185 mg/dL — ABNORMAL HIGH (ref 65–99)
Glucose-Capillary: 168 mg/dL — ABNORMAL HIGH (ref 65–99)

## 2017-08-09 MED ORDER — AMOXICILLIN-POT CLAVULANATE 875-125 MG PO TABS: 1.0000 | ORAL_TABLET | Freq: Three times a day (TID) | ORAL | 0 refills | Status: AC

## 2017-08-09 MED ORDER — OXYCODONE HCL 5 MG PO TABS
5.0000 mg | ORAL_TABLET | ORAL | Status: DC | PRN
  Administered 2017-08-10 – 2017-08-11 (×4): 5 mg via ORAL
  Filled 2017-08-09 (×4): qty 1

## 2017-08-09 MED ORDER — SODIUM CHLORIDE 0.9 % IV SOLN
INTRAVENOUS | Status: DC
  Administered 2017-08-09: 07:00:00 via INTRAVENOUS

## 2017-08-09 NOTE — Progress Notes (Signed)
Patient ID: Sara Tran, female   DOB: [birth year], 61 y.o.   MRN: [id number]  Phone note.  Sara Tran has dislodged her stent so it will be removed.   She had a stent for a week preop so she should be ok.   She otherwise appears to be improving clinically based on my review of the records and speaking with her on the phone but her Cr. Is up a bit more.   Her initial culture grew proteus sens to amp and augmentin.   Culture from yesterday is NG at 24 hours.  Hopefully she will be ready for discharge in the next 24-48 hours.   I will send Augmentin as her discharge abx unless the blood and urine cultures show a different organism than the proteus.  I will have her f/u Friday with a KUB to discuss next steps.

## 2017-08-09 NOTE — Progress Notes (Signed)
Pt with c/o increased frequency with periods of incontinence of large amounts of urine. RN examined Pt and stent is coming out. Urology notified and Dr. Jeffie Pollock via phone said to pull stent completely out. Pt informed and stent pulled out without difficulty.

## 2017-08-09 NOTE — Progress Notes (Signed)
PROGRESS NOTE  Sara Tran TKZ:[id number] DOB: 1956/03/26 DOA: 08/08/2017 PCP: Sharilyn Sites, MD   LOS: 0 days   Brief Narrative / Interim history: 61 year old female with history of morbid obesity, obstructive sleep apnea on CPAP, hypertension, prior history of lap banding in 2011, diabetes, pulmonary hypertension, hyperlipidemia, chronic kidney disease, who is being admitted to urology service for a right staghorn stone status post right cystoscopy with removal of right double-J stent.  Postprocedure she developed fever requiring in hospital stay.  Assessment & Plan: Active Problems:   Chronic diastolic heart failure (HCC)   Morbid obesity (HCC)   Diabetes mellitus ([place])   Chest pain   Acute on chronic renal insufficiency   Sepsis due to urinary tract infection ([place])   Staghorn kidney stones   Sepsis due to urinary tract infection  -Urinalysis consistent with infection, patient started on broad-spectrum antibiotics with vancomycin and Zosyn, will continue both antibiotics today as disappeared peri-procedure and which required gram-positive coverage. -Awaiting cultures, blood cultures and urine cultures are still pending -Sepsis physiology improving, she is afebrile this morning, heart rate is in the 90s and her blood pressure is stable.  Diabetes mellitus  -most recent hemoglobin A1c was 6.8 on July 2018.  Hold home oral agents and place patient on sliding scale insulin -CBGs between 110 and 150 this morning, continue current regimen  Acute on chronic renal failure, underlying chronic kidney disease stage III  - IV fluids as per sepsis protocol, repeat renal function this morning with creatinine virtually unchanged.  Continue to monitor  Chest pain  -Patient changed the story a bit today, tells me this morning that this is related to her asthma/COPD, and it usually when she gets wheezing in her chest gets tight, cardiac enzymes remain negative overnight and EKG without any  specific ischemic changes.  She is chest pain-free.  Chronic diastolic CHF -appears euvolemic currently, careful watch fluid status in the setting of fluid resuscitation with sepsis  Obstructive sleep apnea -ordered nightly CPAP    DVT prophylaxis: SCD Code Status: Full code Family Communication: no family bedside Disposition Plan: hopefully home in ~2 days  Procedures:  8/9 right cystoscopy with removal of right double-J stent, right second-look ureteroscopy with laser tripsy, and insertion of right double-J stent by Dr. Jeffie Pollock  Antimicrobials:  Vancomycin 8/9 >>  Zosyn 8/9 >>   Subjective: - no chest pain, shortness of breath, no abdominal pain, nausea or vomiting. Feeling better but still with right groin and right flank pain  Objective: Vitals:   08/08/17 2252 08/09/17 0634 08/09/17 0751 08/09/17 1303  BP: (!) 143/65 (!) 145/67  (!) 130/50  Pulse: 87 65  96  Resp: (!) 21 20  18   Temp: 99 F (37.2 C) 99.2 F (37.3 C)  98.2 F (36.8 C)  TempSrc: Oral Oral  Oral  SpO2: 98% 97% 98% 97%  Weight:      Height:        Intake/Output Summary (Last 24 hours) at 08/09/17 1305 Last data filed at 08/09/17 0924  Gross per 24 hour  Intake          3759.17 ml  Output              800 ml  Net          2959.17 ml   Filed Weights   08/08/17 0526  Weight: 124.9 kg (275 lb 6 oz)    Examination:  Vitals:   08/08/17 2252 08/09/17 5732 08/09/17 0751 08/09/17  1303  BP: (!) 143/65 (!) 145/67  (!) 130/50  Pulse: 87 65  96  Resp: (!) 21 20  18   Temp: 99 F (37.2 C) 99.2 F (37.3 C)  98.2 F (36.8 C)  TempSrc: Oral Oral  Oral  SpO2: 98% 97% 98% 97%  Weight:      Height:        Constitutional: NAD Eyes: lids and conjunctivae normal Respiratory: clear to auscultation bilaterally, no wheezing, no crackles. Normal respiratory effort. No accessory muscle use.  Cardiovascular: Regular rate and rhythm, no murmurs / rubs / gallops. No LE edema. 2+ pedal pulses.  Abdomen: no  tenderness. Bowel sounds positive.  Skin: no rashes, lesions, ulcers. No induration Neurologic: non focal  Psychiatric: Normal judgment and insight. Alert and oriented x 3. Normal mood.    Data Reviewed: I have independently reviewed following labs and imaging studies   CBC:  Recent Labs Lab 08/08/17 1545 08/09/17 0422  WBC 19.0* 15.2*  NEUTROABS 16.8*  --   HGB 11.2* 10.3*  HCT 34.4* 31.5*  MCV 93.7 93.5  PLT 316 947   Basic Metabolic Panel:  Recent Labs Lab 08/08/17 1545 08/09/17 0422  NA 137 139  K 4.5 4.8  CL 103 109  CO2 24 23  GLUCOSE 193* 209*  BUN 19 15  CREATININE 1.72* 1.76*  CALCIUM 9.0 8.5*   GFR: Estimated Creatinine Clearance: 40.5 mL/min (A) (by C-G formula based on SCr of 1.76 mg/dL (H)). Liver Function Tests:  Recent Labs Lab 08/08/17 1545 08/09/17 0422  AST 23 26  ALT 18 21  ALKPHOS 42 36*  BILITOT 0.5 0.8  PROT 7.4 6.6  ALBUMIN 3.7 3.2*   No results for input(s): LIPASE, AMYLASE in the last 168 hours. No results for input(s): AMMONIA in the last 168 hours. Coagulation Profile:  Recent Labs Lab 08/08/17 1545  INR 1.10   Cardiac Enzymes:  Recent Labs Lab 08/08/17 1545 08/08/17 2200 08/09/17 0422  TROPONINI <0.03 <0.03 <0.03   BNP (last 3 results) No results for input(s): PROBNP in the last 8760 hours. HbA1C: No results for input(s): HGBA1C in the last 72 hours. CBG:  Recent Labs Lab 08/08/17 0949 08/08/17 1717 08/08/17 2230 08/09/17 0740 08/09/17 1130  GLUCAP 130* 203* 154* 155* 115*   Lipid Profile: No results for input(s): CHOL, HDL, LDLCALC, TRIG, CHOLHDL, LDLDIRECT in the last 72 hours. Thyroid Function Tests: No results for input(s): TSH, T4TOTAL, FREET4, T3FREE, THYROIDAB in the last 72 hours. Anemia Panel: No results for input(s): VITAMINB12, FOLATE, FERRITIN, TIBC, IRON, RETICCTPCT in the last 72 hours. Urine analysis:    Component Value Date/Time   COLORURINE YELLOW 08/08/2017 1500   APPEARANCEUR  CLOUDY (A) 08/08/2017 1500   APPEARANCEUR Clear 06/18/2017 1655   LABSPEC 1.014 08/08/2017 1500   PHURINE 5.0 08/08/2017 1500   GLUCOSEU NEGATIVE 08/08/2017 1500   HGBUR LARGE (A) 08/08/2017 1500   BILIRUBINUR NEGATIVE 08/08/2017 1500   BILIRUBINUR Negative 06/18/2017 1655   KETONESUR NEGATIVE 08/08/2017 1500   PROTEINUR 100 (A) 08/08/2017 1500   UROBILINOGEN 0.2 09/09/2013 1911   NITRITE NEGATIVE 08/08/2017 1500   LEUKOCYTESUR LARGE (A) 08/08/2017 1500   LEUKOCYTESUR 3+ (A) 06/18/2017 1655   Sepsis Labs: Invalid input(s): PROCALCITONIN, LACTICIDVEN  Recent Results (from the past 240 hour(s))  Stone analysis     Status: None   Collection Time: 08/01/17 10:23 AM  Result Value Ref Range Status   Color Tan  Final   Size Comment mm Final  Comment: Specimen received as fragments.   Stone Weight KSTONE 97.0 mg Corrected   Nidus No Nidus visualized  Corrected   Ca Oxalate,Monohydr. 35 % Corrected   Ca phos cry stone ql IR 30 % Corrected   Magnesium Ammon Phos 15 % Corrected   Ammonium Acid Urate 20 % Corrected   Composition Comment  Corrected    Comment: Percentage (Represents the % composition)   STONE COMMENT Note:  Corrected    Comment: (NOTE) Please do not submit specimens on Q-Tips, in tape, on filters, or in liquids such as blood, urine or formalin.  This may cause unnecessary biohazards, erroneous results and/or delay in the processing of the specimen.    Photo Comment  Corrected    Comment: Photograph will follow under separate cover.   Comment: Comment  Corrected    Comment: (NOTE) Physician questions regarding Calculi Analysis contact [facility] at: [phone].    PLEASE NOTE: Comment  Corrected    Comment: (NOTE) Calculi report with photograph will follow via computer, mail or courier delivery.    Disclaimer - Kidney Stone Analysis: Comment  Corrected    Comment: (NOTE) This test was developed and its performance characteristics determined by [facility]. It  has not been cleared or approved by the Food and Drug Administration. Performed At: [facility] [address] [place], [place] [id number] Lindon Romp MD RJ:[id number]   Culture, blood (routine x 2)     Status: None (Preliminary result)   Collection Time: 08/08/17  3:45 PM  Result Value Ref Range Status   Specimen Description BLOOD LEFT ANTECUBITAL  Final   Special Requests IN PEDIATRIC BOTTLE Blood Culture adequate volume  Final   Culture   Final    NO GROWTH < 24 HOURS Performed at [facility], [place] [address], [place], [place] [address]    Report Status PENDING  Incomplete  Culture, blood (routine x 2)     Status: None (Preliminary result)   Collection Time: 08/08/17  3:52 PM  Result Value Ref Range Status   Specimen Description BLOOD LEFT ANTECUBITAL  Final   Special Requests IN PEDIATRIC BOTTLE Blood Culture adequate volume  Final   Culture   Final    NO GROWTH < 24 HOURS Performed at [facility], [place] [address], [place], [place] [address]    Report Status PENDING  Incomplete      Radiology Studies: Dg Chest Port 1 View  Result Date: 08/08/2017 CLINICAL DATA:  Acute lower central chest pains today and shortness-of-breath. EXAM: PORTABLE CHEST 1 VIEW COMPARISON:  05/02/2017 FINDINGS: Lungs are hypoinflated with minimal prominence of the perihilar markings suggesting mild vascular congestion. Mild stable cardiomegaly. Remainder of the exam is unchanged. IMPRESSION: Stable cardiomegaly with suggestion mild vascular congestion. Electronically Signed   By: Marin Olp M.D.   On: 08/08/2017 15:44   Dg C-arm 61-120 Min-no Report  Result Date: 08/08/2017 Fluoroscopy was utilized by the requesting physician.  No radiographic interpretation.     Scheduled Meds: . budesonide  0.25 mg Nebulization BID  . fenofibrate  160 mg Oral Daily  . fluticasone  1 spray Each Nare Daily  . gabapentin  300 mg Oral TID  . insulin aspart  0-20 Units Subcutaneous TID  WC  . insulin aspart  0-5 Units Subcutaneous QHS  . isosorbide mononitrate  30 mg Oral Daily  . loratadine  10 mg Oral Daily  . magnesium oxide  400 mg Oral Daily  . potassium chloride SA  20 mEq Oral q morning - 10a   Continuous Infusions: . sodium chloride 50 mL/hr at 08/09/17 0657  . piperacillin-tazobactam (ZOSYN)  IV Stopped (08/09/17 1000)  . vancomycin       Marzetta Board, MD, PhD [facility] Pager [phone]  If 7PM-7AM, please contact night-coverage [email or web address] Password [email or web address] 08/09/2017, 1:05 PM

## 2017-08-09 NOTE — Progress Notes (Signed)
1 Day Post-Op Subjective: Patient reports nausea and pain control good. She continues to have intermittent fevers.   Objective: Vital signs in last 24 hours: Temp:  [98.2 F (36.8 C)-102 F (38.9 C)] 98.2 F (36.8 C) (08/10 1303) Pulse Rate:  [65-112] 96 (08/10 1303) Resp:  [16-24] 18 (08/10 1303) BP: (130-178)/(50-92) 130/50 (08/10 1303) SpO2:  [93 %-99 %] 97 % (08/10 1303)  Intake/Output from previous day: 08/09 0701 - 08/10 0700 In: 5749.2 [P.O.:240; I.V.:3474.2; IV Piggyback:2035] Out: 425 [Urine:400; Blood:25] Intake/Output this shift: Total I/O In: 120 [P.O.:120] Out: 400 [Urine:400]  Physical Exam:  General:alert, cooperative and appears stated age GI: soft, non tender, normal bowel sounds, no palpable masses, no organomegaly, no inguinal hernia Female genitalia: not done Extremities: extremities normal, atraumatic, no cyanosis or edema  Lab Results:  Recent Labs  08/08/17 1545 08/09/17 0422  HGB 11.2* 10.3*  HCT 34.4* 31.5*   BMET  Recent Labs  08/08/17 1545 08/09/17 0422  NA 137 139  K 4.5 4.8  CL 103 109  CO2 24 23  GLUCOSE 193* 209*  BUN 19 15  CREATININE 1.72* 1.76*  CALCIUM 9.0 8.5*    Recent Labs  08/08/17 1545  INR 1.10   No results for input(s): LABURIN in the last 72 hours. Results for orders placed or performed during the hospital encounter of 08/08/17  Culture, blood (routine x 2)     Status: None (Preliminary result)   Collection Time: 08/08/17  3:45 PM  Result Value Ref Range Status   Specimen Description BLOOD LEFT ANTECUBITAL  Final   Special Requests IN PEDIATRIC BOTTLE Blood Culture adequate volume  Final   Culture   Final    NO GROWTH < 24 HOURS Performed at [facility], [place] [address] [place], [place] [address]    Report Status PENDING  Incomplete  Culture, blood (routine x 2)     Status: None (Preliminary result)   Collection Time: 08/08/17  3:52 PM  Result Value Ref Range Status   Specimen Description  BLOOD LEFT ANTECUBITAL  Final   Special Requests IN PEDIATRIC BOTTLE Blood Culture adequate volume  Final   Culture   Final    NO GROWTH < 24 HOURS Performed at [facility], [place] [address], [place], [place] [address]    Report Status PENDING  Incomplete    Studies/Results: Dg Chest Port 1 View  Result Date: 08/08/2017 CLINICAL DATA:  Acute lower central chest pains today and shortness-of-breath. EXAM: PORTABLE CHEST 1 VIEW COMPARISON:  05/02/2017 FINDINGS: Lungs are hypoinflated with minimal prominence of the perihilar markings suggesting mild vascular congestion. Mild stable cardiomegaly. Remainder of the exam is unchanged. IMPRESSION: Stable cardiomegaly with suggestion mild vascular congestion. Electronically Signed   By: Marin Olp M.D.   On: 08/08/2017 15:44   Dg C-arm 61-120 Min-no Report  Result Date: 08/08/2017 Fluoroscopy was utilized by the requesting physician.  No radiographic interpretation.    Assessment/Plan: 61yo with sepsis after URS  1. Continue broad spectrum antibiotics 2. Percocet for pain control   LOS: 0 days   [name] 08/09/2017, 1:19 PM

## 2017-08-10 DIAGNOSIS — A419 Sepsis, unspecified organism: Secondary | ICD-10-CM | POA: Diagnosis present

## 2017-08-10 DIAGNOSIS — E785 Hyperlipidemia, unspecified: Secondary | ICD-10-CM | POA: Diagnosis present

## 2017-08-10 DIAGNOSIS — N200 Calculus of kidney: Secondary | ICD-10-CM | POA: Diagnosis present

## 2017-08-10 DIAGNOSIS — E119 Type 2 diabetes mellitus without complications: Secondary | ICD-10-CM | POA: Diagnosis not present

## 2017-08-10 DIAGNOSIS — Z9884 Bariatric surgery status: Secondary | ICD-10-CM | POA: Diagnosis not present

## 2017-08-10 DIAGNOSIS — E1141 Type 2 diabetes mellitus with diabetic mononeuropathy: Secondary | ICD-10-CM | POA: Diagnosis present

## 2017-08-10 DIAGNOSIS — E1122 Type 2 diabetes mellitus with diabetic chronic kidney disease: Secondary | ICD-10-CM | POA: Diagnosis present

## 2017-08-10 DIAGNOSIS — I252 Old myocardial infarction: Secondary | ICD-10-CM | POA: Diagnosis not present

## 2017-08-10 DIAGNOSIS — E662 Morbid (severe) obesity with alveolar hypoventilation: Secondary | ICD-10-CM | POA: Diagnosis present

## 2017-08-10 DIAGNOSIS — G40909 Epilepsy, unspecified, not intractable, without status epilepticus: Secondary | ICD-10-CM | POA: Diagnosis present

## 2017-08-10 DIAGNOSIS — I130 Hypertensive heart and chronic kidney disease with heart failure and stage 1 through stage 4 chronic kidney disease, or unspecified chronic kidney disease: Secondary | ICD-10-CM | POA: Diagnosis present

## 2017-08-10 DIAGNOSIS — I272 Pulmonary hypertension, unspecified: Secondary | ICD-10-CM | POA: Diagnosis present

## 2017-08-10 DIAGNOSIS — N289 Disorder of kidney and ureter, unspecified: Secondary | ICD-10-CM | POA: Diagnosis not present

## 2017-08-10 DIAGNOSIS — N3090 Cystitis, unspecified without hematuria: Secondary | ICD-10-CM | POA: Diagnosis present

## 2017-08-10 DIAGNOSIS — M1990 Unspecified osteoarthritis, unspecified site: Secondary | ICD-10-CM | POA: Diagnosis present

## 2017-08-10 DIAGNOSIS — Z833 Family history of diabetes mellitus: Secondary | ICD-10-CM | POA: Diagnosis not present

## 2017-08-10 DIAGNOSIS — I4891 Unspecified atrial fibrillation: Secondary | ICD-10-CM | POA: Diagnosis present

## 2017-08-10 DIAGNOSIS — Z888 Allergy status to other drugs, medicaments and biological substances status: Secondary | ICD-10-CM | POA: Diagnosis not present

## 2017-08-10 DIAGNOSIS — J449 Chronic obstructive pulmonary disease, unspecified: Secondary | ICD-10-CM | POA: Diagnosis present

## 2017-08-10 DIAGNOSIS — Z9049 Acquired absence of other specified parts of digestive tract: Secondary | ICD-10-CM | POA: Diagnosis not present

## 2017-08-10 DIAGNOSIS — Z6841 Body Mass Index (BMI) 40.0 and over, adult: Secondary | ICD-10-CM | POA: Diagnosis not present

## 2017-08-10 DIAGNOSIS — Z8249 Family history of ischemic heart disease and other diseases of the circulatory system: Secondary | ICD-10-CM | POA: Diagnosis not present

## 2017-08-10 DIAGNOSIS — K219 Gastro-esophageal reflux disease without esophagitis: Secondary | ICD-10-CM | POA: Diagnosis present

## 2017-08-10 DIAGNOSIS — N183 Chronic kidney disease, stage 3 (moderate): Secondary | ICD-10-CM | POA: Diagnosis present

## 2017-08-10 DIAGNOSIS — I5032 Chronic diastolic (congestive) heart failure: Secondary | ICD-10-CM | POA: Diagnosis present

## 2017-08-10 DIAGNOSIS — Z87891 Personal history of nicotine dependence: Secondary | ICD-10-CM | POA: Diagnosis not present

## 2017-08-10 LAB — CBC
HCT: 30.1 % — ABNORMAL LOW (ref 36.0–46.0)
Hemoglobin: 9.4 g/dL — ABNORMAL LOW (ref 12.0–15.0)
MCH: 29.5 pg (ref 26.0–34.0)
MCHC: 31.2 g/dL (ref 30.0–36.0)
MCV: 94.4 fL (ref 78.0–100.0)
Platelets: 281 10*3/uL (ref 150–400)
RBC: 3.19 MIL/uL — ABNORMAL LOW (ref 3.87–5.11)
RDW: 13.1 % (ref 11.5–15.5)
WBC: 13.8 10*3/uL — ABNORMAL HIGH (ref 4.0–10.5)

## 2017-08-10 LAB — BASIC METABOLIC PANEL
Anion gap: 10 (ref 5–15)
BUN: 13 mg/dL (ref 6–20)
CO2: 22 mmol/L (ref 22–32)
Calcium: 8.8 mg/dL — ABNORMAL LOW (ref 8.9–10.3)
Chloride: 107 mmol/L (ref 101–111)
Creatinine, Ser: 1.89 mg/dL — ABNORMAL HIGH (ref 0.44–1.00)
GFR calc Af Amer: 32 mL/min — ABNORMAL LOW (ref >60)
GFR calc non Af Amer: 28 mL/min — ABNORMAL LOW (ref >60)
Glucose, Bld: 208 mg/dL — ABNORMAL HIGH (ref 65–99)
Potassium: 4.6 mmol/L (ref 3.5–5.1)
Sodium: 139 mmol/L (ref 135–145)

## 2017-08-10 LAB — C DIFFICILE QUICK SCREEN W PCR REFLEX
C Diff antigen: NEGATIVE
C Diff interpretation: NOT DETECTED
C Diff toxin: NEGATIVE

## 2017-08-10 LAB — URINE CULTURE: Culture: <10000 — AB

## 2017-08-10 LAB — GLUCOSE, CAPILLARY
Glucose-Capillary: 164 mg/dL — ABNORMAL HIGH (ref 65–99)
Glucose-Capillary: 147 mg/dL — ABNORMAL HIGH (ref 65–99)
Glucose-Capillary: 143 mg/dL — ABNORMAL HIGH (ref 65–99)
Glucose-Capillary: 190 mg/dL — ABNORMAL HIGH (ref 65–99)

## 2017-08-10 NOTE — Progress Notes (Signed)
Urology Progress Note   2 Days Post-Op   Patient 61yo F s/p right ureteroscopic stone extraction for a right staghorn stone and right J stent placement 8/9 who developed fevers concern for urosepsis post operatively. She received ampicillin and genatmicin preoperatively.   Subjective: Ureteral stent dislodged, removed at beside yesterday. Patient doing well with some right flank soreness but no discrete pain. Blood cultures no growth at 24 hours. Urine cultures from July 2018 all grew Proteus, only resistant to nitrofurantoin. Urine culture from this admission with <10k, no growth.   Borderline fever (38.2) overnight with mild tachycardia. VSS, BP wnl. Cr slightly elevated this AM, 1.89 from baseline 1.2. Leukocytosis improving, 13.8.  Currently on vanc/zosyn.   Objective: Vital signs in last 24 hours: Temp:  [98.2 F (36.8 C)-100.8 F (38.2 C)] 98.8 F (37.1 C) (08/11 0912) Pulse Rate:  [96-103] 103 (08/11 0426) Resp:  [18-19] 19 (08/11 0426) BP: (130-153)/(50-75) 152/62 (08/11 0426) SpO2:  [96 %-98 %] 98 % (08/11 0912)  Intake/Output from previous day: 08/10 0701 - 08/11 0700 In: 2280 [P.O.:480; I.V.:1150; IV Piggyback:650] Out: 875 [Urine:875] Intake/Output this shift: Total I/O In: 120 [P.O.:120] Out: -   Physical Exam:  General: Alert and oriented CV: RRR Lungs: Clear Abdomen: soft, obese, non distended Ext: NT, No erythema  Lab Results:  Recent Labs  08/08/17 1545 08/09/17 0422 08/10/17 0510  HGB 11.2* 10.3* 9.4*  HCT 34.4* 31.5* 30.1*   BMET  Recent Labs  08/09/17 0422 08/10/17 0510  NA 139 139  K 4.8 4.6  CL 109 107  CO2 23 22  GLUCOSE 209* 208*  BUN 15 13  CREATININE 1.76* 1.89*  CALCIUM 8.5* 8.8*     Studies/Results: Dg Chest Port 1 View  Result Date: 08/08/2017 CLINICAL DATA:  Acute lower central chest pains today and shortness-of-breath. EXAM: PORTABLE CHEST 1 VIEW COMPARISON:  05/02/2017 FINDINGS: Lungs are hypoinflated with minimal  prominence of the perihilar markings suggesting mild vascular congestion. Mild stable cardiomegaly. Remainder of the exam is unchanged. IMPRESSION: Stable cardiomegaly with suggestion mild vascular congestion. Electronically Signed   By: Marin Olp M.D.   On: 08/08/2017 15:44    Assessment/Plan:  89CB F s/p right ureteroscopic stone extraction for a right staghorn stone and right J stent placement 8/9 who developed fevers concerning for urosepsis post operatively. On vanc/zosyn, fever curve downtrending  - no foley - continue vanc/zosyn - repeat labs tomorrow - f/u cultures  - medlock, carb modified diet  - if afebrile for 24 hours, will d/c tomorrow on augmentin to complete 14 day course  - appreciate medicine assistance with patient care    LOS: 0 days   Adylynn Hertenstein L 08/10/2017, 11:00 AM

## 2017-08-10 NOTE — Progress Notes (Signed)
PROGRESS NOTE  Sara Tran SAY:[id number] DOB: 1956-05-05 DOA: 08/08/2017 PCP: Sharilyn Sites, MD   LOS: 0 days   Brief Narrative / Interim history: 61 year old female with history of morbid obesity, obstructive sleep apnea on CPAP, hypertension, prior history of lap banding in 2011, diabetes, pulmonary hypertension, hyperlipidemia, chronic kidney disease, who is being admitted to urology service for a right staghorn stone status post right cystoscopy with removal of right double-J stent.  Postprocedure she developed fever requiring in hospital stay.  Assessment & Plan: Active Problems:   Chronic diastolic heart failure (HCC)   Morbid obesity (HCC)   Diabetes mellitus ([place])   Chest pain   Acute on chronic renal insufficiency   Sepsis due to urinary tract infection ([place])   Staghorn kidney stones   Sepsis due to urinary tract infection  -Urinalysis consistent with infection, patient started on broad-spectrum antibiotics with vancomycin and Zosyn, will continue both antibiotics today as disappeared peri-procedure and which requires gram-positive coverage. -Awaiting cultures, blood cultures and urine cultures are still pending -Fever curve improving, 100.8 this morning.  Continue antibiotics, if afebrile for the next 24 hours I am okay with home discharge  Diabetes mellitus  -most recent hemoglobin A1c was 6.8 on July 2018.  Hold home oral agents and place patient on sliding scale insulin -Fasting CBG 164, continue current regimen  Acute on chronic renal failure, underlying chronic kidney disease stage III  -Creatinine overall stable, slightly increased today, continue to monitor  Chest tightness -Patient reported chest pain initially on my evaluation, this is chronic, related to her underlying asthma as it is associated with wheezing  Chronic diastolic CHF -appears euvolemic currently, careful watch fluid status in the setting of fluid resuscitation with sepsis -Stop IV fluids  today  Obstructive sleep apnea -ordered nightly CPAP    DVT prophylaxis: SCD Code Status: Full code Family Communication: no family bedside Disposition Plan: hopefully home in 1 day  Procedures:  8/9 right cystoscopy with removal of right double-J stent, right second-look ureteroscopy with laser tripsy, and insertion of right double-J stent by Dr. Jeffie Pollock  Antimicrobials:  Vancomycin 8/9 >>  Zosyn 8/9 >>   Subjective: -Complains of right flank and right groin pain, no chills, no chest pain, no shortness of breath  Objective: Vitals:   08/09/17 1303 08/09/17 2100 08/10/17 0426 08/10/17 0912  BP: (!) 130/50 (!) 153/75 (!) 152/62   Pulse: 96 100 (!) 103   Resp: 18 18 19    Temp: 98.2 F (36.8 C) 99.3 F (37.4 C) (!) 100.8 F (38.2 C) 98.8 F (37.1 C)  TempSrc: Oral Oral Axillary Oral  SpO2: 97% 96% 96% 98%  Weight:      Height:        Intake/Output Summary (Last 24 hours) at 08/10/17 1133 Last data filed at 08/10/17 1000  Gross per 24 hour  Intake             2280 ml  Output              475 ml  Net             1805 ml   Filed Weights   08/08/17 0526  Weight: 124.9 kg (275 lb 6 oz)    Examination:  Vitals:   08/09/17 1303 08/09/17 2100 08/10/17 0426 08/10/17 0912  BP: (!) 130/50 (!) 153/75 (!) 152/62   Pulse: 96 100 (!) 103   Resp: 18 18 19    Temp: 98.2 F (36.8 C) 99.3 F (37.4  C) (!) 100.8 F (38.2 C) 98.8 F (37.1 C)  TempSrc: Oral Oral Axillary Oral  SpO2: 97% 96% 96% 98%  Weight:      Height:        Constitutional: NAD, calm, comfortable Eyes: PERRL, lids and conjunctivae normal Respiratory: clear to auscultation bilaterally, no wheezing, no crackles.  Cardiovascular: Regular rate and rhythm, no murmurs / rubs / gallops. No LE edema. Abdomen:Bowel sounds positive.  Neurologic: non focal  Data Reviewed: I have independently reviewed following labs and imaging studies   CBC:  Recent Labs Lab 08/08/17 1545 08/09/17 0422 08/10/17 0510   WBC 19.0* 15.2* 13.8*  NEUTROABS 16.8*  --   --   HGB 11.2* 10.3* 9.4*  HCT 34.4* 31.5* 30.1*  MCV 93.7 93.5 94.4  PLT 316 270 885   Basic Metabolic Panel:  Recent Labs Lab 08/08/17 1545 08/09/17 0422 08/10/17 0510  NA 137 139 139  K 4.5 4.8 4.6  CL 103 109 107  CO2 24 23 22   GLUCOSE 193* 209* 208*  BUN 19 15 13   CREATININE 1.72* 1.76* 1.89*  CALCIUM 9.0 8.5* 8.8*   GFR: Estimated Creatinine Clearance: 37.7 mL/min (A) (by C-G formula based on SCr of 1.89 mg/dL (H)). Liver Function Tests:  Recent Labs Lab 08/08/17 1545 08/09/17 0422  AST 23 26  ALT 18 21  ALKPHOS 42 36*  BILITOT 0.5 0.8  PROT 7.4 6.6  ALBUMIN 3.7 3.2*   No results for input(s): LIPASE, AMYLASE in the last 168 hours. No results for input(s): AMMONIA in the last 168 hours. Coagulation Profile:  Recent Labs Lab 08/08/17 1545  INR 1.10   Cardiac Enzymes:  Recent Labs Lab 08/08/17 1545 08/08/17 2200 08/09/17 0422  TROPONINI <0.03 <0.03 <0.03   BNP (last 3 results) No results for input(s): PROBNP in the last 8760 hours. HbA1C: No results for input(s): HGBA1C in the last 72 hours. CBG:  Recent Labs Lab 08/09/17 1130 08/09/17 1642 08/09/17 2114 08/10/17 0740 08/10/17 1108  GLUCAP 115* 185* 168* 164* 147*   Lipid Profile: No results for input(s): CHOL, HDL, LDLCALC, TRIG, CHOLHDL, LDLDIRECT in the last 72 hours. Thyroid Function Tests: No results for input(s): TSH, T4TOTAL, FREET4, T3FREE, THYROIDAB in the last 72 hours. Anemia Panel: No results for input(s): VITAMINB12, FOLATE, FERRITIN, TIBC, IRON, RETICCTPCT in the last 72 hours. Urine analysis:    Component Value Date/Time   COLORURINE YELLOW 08/08/2017 1500   APPEARANCEUR CLOUDY (A) 08/08/2017 1500   APPEARANCEUR Clear 06/18/2017 1655   LABSPEC 1.014 08/08/2017 1500   PHURINE 5.0 08/08/2017 1500   GLUCOSEU NEGATIVE 08/08/2017 1500   HGBUR LARGE (A) 08/08/2017 1500   BILIRUBINUR NEGATIVE 08/08/2017 1500   BILIRUBINUR  Negative 06/18/2017 1655   KETONESUR NEGATIVE 08/08/2017 1500   PROTEINUR 100 (A) 08/08/2017 1500   UROBILINOGEN 0.2 09/09/2013 1911   NITRITE NEGATIVE 08/08/2017 1500   LEUKOCYTESUR LARGE (A) 08/08/2017 1500   LEUKOCYTESUR 3+ (A) 06/18/2017 1655   Sepsis Labs: Invalid input(s): PROCALCITONIN, LACTICIDVEN  Recent Results (from the past 240 hour(s))  Stone analysis     Status: None   Collection Time: 08/01/17 10:23 AM  Result Value Ref Range Status   Color Tan  Final   Size Comment mm Final    Comment: Specimen received as fragments.   Stone Weight KSTONE 97.0 mg Corrected   Nidus No Nidus visualized  Corrected   Ca Oxalate,Monohydr. 35 % Corrected   Ca phos cry stone ql IR 30 % Corrected  Magnesium Ammon Phos 15 % Corrected   Ammonium Acid Urate 20 % Corrected   Composition Comment  Corrected    Comment: Percentage (Represents the % composition)   STONE COMMENT Note:  Corrected    Comment: (NOTE) Please do not submit specimens on Q-Tips, in tape, on filters, or in liquids such as blood, urine or formalin.  This may cause unnecessary biohazards, erroneous results and/or delay in the processing of the specimen.    Photo Comment  Corrected    Comment: Photograph will follow under separate cover.   Comment: Comment  Corrected    Comment: (NOTE) Physician questions regarding Calculi Analysis contact [facility] at: [phone].    PLEASE NOTE: Comment  Corrected    Comment: (NOTE) Calculi report with photograph will follow via computer, mail or courier delivery.    Disclaimer - Kidney Stone Analysis: Comment  Corrected    Comment: (NOTE) This test was developed and its performance characteristics determined by [facility]. It has not been cleared or approved by the Food and Drug Administration. Performed At: [facility] [place], [place] [id number] Lindon Romp MD IZ:[id number]   Urine culture     Status: Abnormal   Collection Time: 08/08/17   3:00 PM  Result Value Ref Range Status   Specimen Description URINE, RANDOM  Final   Special Requests NONE  Final   Culture (A)  Final    <10,000 COLONIES/mL INSIGNIFICANT GROWTH Performed at [facility], [place] [address], [place], [place] [address]    Report Status 08/10/2017 FINAL  Final  Culture, blood (routine x 2)     Status: None (Preliminary result)   Collection Time: 08/08/17  3:45 PM  Result Value Ref Range Status   Specimen Description BLOOD LEFT ANTECUBITAL  Final   Special Requests IN PEDIATRIC BOTTLE Blood Culture adequate volume  Final   Culture   Final    NO GROWTH < 24 HOURS Performed at [facility], [place] [address], [place], [place] [address]    Report Status PENDING  Incomplete  Culture, blood (routine x 2)     Status: None (Preliminary result)   Collection Time: 08/08/17  3:52 PM  Result Value Ref Range Status   Specimen Description BLOOD LEFT ANTECUBITAL  Final   Special Requests IN PEDIATRIC BOTTLE Blood Culture adequate volume  Final   Culture   Final    NO GROWTH < 24 HOURS Performed at [facility], [place] [address] [place], [place] [address]    Report Status PENDING  Incomplete      Radiology Studies: Dg Chest Port 1 View  Result Date: 08/08/2017 CLINICAL DATA:  Acute lower central chest pains today and shortness-of-breath. EXAM: PORTABLE CHEST 1 VIEW COMPARISON:  05/02/2017 FINDINGS: Lungs are hypoinflated with minimal prominence of the perihilar markings suggesting mild vascular congestion. Mild stable cardiomegaly. Remainder of the exam is unchanged. IMPRESSION: Stable cardiomegaly with suggestion mild vascular congestion. Electronically Signed   By: Marin Olp M.D.   On: 08/08/2017 15:44     Scheduled Meds: . budesonide  0.25 mg Nebulization BID  . fenofibrate  160 mg Oral Daily  . fluticasone  1 spray Each Nare Daily  . gabapentin  300 mg Oral TID  . insulin aspart  0-20 Units Subcutaneous TID WC  . insulin aspart  0-5 Units  Subcutaneous QHS  . isosorbide mononitrate  30 mg Oral Daily  . loratadine  10 mg Oral Daily  . magnesium oxide  400 mg Oral Daily  . potassium chloride SA  20 mEq Oral q morning - 10a   Continuous Infusions: . piperacillin-tazobactam (ZOSYN)  IV Stopped (08/10/17 8721)  . vancomycin Stopped (08/09/17 1508)     Marzetta Board, MD, PhD [facility] Pager [phone]  If 7PM-7AM, please contact night-coverage [email or web address] Password [facility] 08/10/2017, 11:33 AM

## 2017-08-11 DIAGNOSIS — N200 Calculus of kidney: Secondary | ICD-10-CM

## 2017-08-11 LAB — BASIC METABOLIC PANEL
Anion gap: 11 (ref 5–15)
BUN: 13 mg/dL (ref 6–20)
CO2: 24 mmol/L (ref 22–32)
Calcium: 9.2 mg/dL (ref 8.9–10.3)
Chloride: 104 mmol/L (ref 101–111)
Creatinine, Ser: 1.84 mg/dL — ABNORMAL HIGH (ref 0.44–1.00)
GFR calc Af Amer: 34 mL/min — ABNORMAL LOW (ref >60)
GFR calc non Af Amer: 29 mL/min — ABNORMAL LOW (ref >60)
Glucose, Bld: 183 mg/dL — ABNORMAL HIGH (ref 65–99)
Potassium: 4.2 mmol/L (ref 3.5–5.1)
Sodium: 139 mmol/L (ref 135–145)

## 2017-08-11 LAB — CBC
HCT: 30.6 % — ABNORMAL LOW (ref 36.0–46.0)
Hemoglobin: 9.9 g/dL — ABNORMAL LOW (ref 12.0–15.0)
MCH: 30.4 pg (ref 26.0–34.0)
MCHC: 32.4 g/dL (ref 30.0–36.0)
MCV: 93.9 fL (ref 78.0–100.0)
Platelets: 301 10*3/uL (ref 150–400)
RBC: 3.26 MIL/uL — ABNORMAL LOW (ref 3.87–5.11)
RDW: 12.9 % (ref 11.5–15.5)
WBC: 13.4 10*3/uL — ABNORMAL HIGH (ref 4.0–10.5)

## 2017-08-11 LAB — GLUCOSE, CAPILLARY
Glucose-Capillary: 164 mg/dL — ABNORMAL HIGH (ref 65–99)
Glucose-Capillary: 225 mg/dL — ABNORMAL HIGH (ref 65–99)

## 2017-08-11 MED ORDER — AMLODIPINE BESYLATE 10 MG PO TABS
10.0000 mg | ORAL_TABLET | Freq: Every day | ORAL | Status: DC
  Administered 2017-08-11: 10 mg via ORAL
  Filled 2017-08-11: qty 1

## 2017-08-11 MED ORDER — PHENAZOPYRIDINE HCL 200 MG PO TABS: 200.0000 mg | ORAL_TABLET | Freq: Three times a day (TID) | ORAL | 0 refills | Status: AC | PRN

## 2017-08-11 NOTE — Discharge Summary (Signed)
[facility] Discharge Summary  Admit date: 08/08/2017  Discharge date and time: 08/11/17   Discharge to: Home  Discharge Service: Urology  Discharge Attending Physician:  Dr. [name]  Discharge  Diagnoses: Staghorn Nephrolithiasis, Urosepsis   Secondary Diagnosis: Active Problems:   Chronic diastolic heart failure ([place])   Morbid obesity ([place])   Diabetes mellitus ([place])   Chest pain   Acute on chronic renal insufficiency   Sepsis due to urinary tract infection ([place])   Staghorn kidney stones   OR Procedures: Procedure(s): RIGHT URETEROSCOPY WITH HOLMIUM LASER RIGHT STENT PLACEMENT 08/08/2017   Ancillary Procedures: None   Discharge Day Services: The patient was seen and examined by the Urology team both in the morning and immediately prior to discharge.  Vital signs and laboratory values were stable and within normal limits.  The physical exam was benign and unchanged and all surgical wounds were examined.  Discharge instructions were explained and all questions answered.  Subjective  No acute events overnight. Pain Controlled. No fever or chills.  Objective Patient Vitals for the past 8 hrs:  BP Temp Temp src Pulse Resp SpO2  08/11/17 0919 - - - - - 93 %  08/11/17 0446 (!) 154/77 98.5 F (36.9 C) Oral 90 20 98 %   No intake/output data recorded.  General Appearance:        No acute distress Lungs:                 Normal work of breathing on room air Heart:                             Regular rate and rhythm Abdomen:                       Soft, non-tender, non-distended. Minimal left flank tenderness  Extremities:                  Warm and well perfused    Hospital Course:  Patient 61yo F s/p right ureteroscopic stone extraction for a right staghorn stone and right J stent placement 8/9 who developed fevers and concern for urosepsis post operatively. Urine culture post operatively resulted in <10k, insignificant growth. Blood cultures no growth at time of  discharge.   The patient tolerated the procedure well, was extubated in the OR, and afterwards was taken to the PACU for routine post-surgical care. When stable the patient was transferred to the floor.    Uretereral stent was dislodged on POD1, and was removed at bedside.  The patient's diet was slowly advanced and at the time of discharge was tolerating a regular diet. Foley catheter was removed on POD1, and patient was able to void volitionally at time of discharge. Fevers continued through POD1, and patient was continued on vanc/zosyn throughout hospitalization during which she remained afebrile after POD1. The patient was discharged home 3 Days Post-Op, at which point was tolerating a regular solid diet, was able to void spontaneously, have adequate pain control with P.O. pain medication, and could ambulate without difficulty. She will be discharged to complete a 14 day course of augmentin. The patient will follow up with [place] for post op check this Friday.   Condition at Discharge: Improved  Discharge Medications:  Allergies as of 08/11/2017      Reactions   Bee Venom Anaphylaxis   As a child   Ancef [cefazolin] Other (See Comments)   Patient  was told she was allergic but doesn't remember what happens   Aspirin Other (See Comments)   Rectal bleeding   Bactrim [sulfamethoxazole-trimethoprim] Other (See Comments)   Cramping,bloating,vomiting,fever,headaches   Ceftin [cefuroxime Axetil] Swelling   Swollen tongue, "made me crazy"   Ciprofloxacin Cough   Coughed up blood   Macrodantin [nitrofurantoin] Other (See Comments)   Vomited blood and had dark stool.   Milk-related Compounds Other (See Comments)   IBS   Other    Snake venom - not sure of reaction -had problems with feet    Latex Itching, Rash      Medication List    STOP taking these medications   ampicillin 500 MG capsule Commonly known as:  PRINCIPEN   HYDROcodone-acetaminophen 5-325 MG tablet Commonly known as:  NORCO    losartan 100 MG tablet Commonly known as:  COZAAR     TAKE these medications   albuterol 108 (90 Base) MCG/ACT inhaler Commonly known as:  PROVENTIL HFA;VENTOLIN HFA Inhale 2 puffs into the lungs every 6 (six) hours as needed. FOR SHORTNESS OF BREATH/WHEEZING   albuterol (2.5 MG/3ML) 0.083% nebulizer solution Commonly known as:  PROVENTIL Take 2.5 mg by nebulization every 4 (four) hours as needed for wheezing or shortness of breath.   ALPRAZolam 0.5 MG tablet Commonly known as:  XANAX Take 0.5 mg by mouth 3 (three) times daily as needed. For anxiety/sleep   amLODipine 10 MG tablet Commonly known as:  NORVASC Take 1 tablet by mouth every evening.   amoxicillin-clavulanate 875-125 MG tablet Commonly known as:  AUGMENTIN Take 1 tablet by mouth 3 (three) times daily.   budesonide 180 MCG/ACT inhaler Commonly known as:  PULMICORT Inhale 2 puffs into the lungs 2 (two) times daily.   carisoprodol 350 MG tablet Commonly known as:  SOMA TAKE 1 TABLET 4 TIMES A DAY AS NEEDED FOR MUSCLE SPASM.   fenofibrate 160 MG tablet Take 160 mg by mouth daily.   fluticasone 50 MCG/ACT nasal spray Commonly known as:  FLONASE Place 1-2 sprays into the nose daily.   gabapentin 300 MG capsule Commonly known as:  NEURONTIN Take 300 mg by mouth 3 (three) times daily.   HYDROmorphone 2 MG tablet Commonly known as:  DILAUDID Take 1 tablet (2 mg total) by mouth every 4 (four) hours as needed for severe pain.   ipratropium 0.02 % nebulizer solution Commonly known as:  ATROVENT Take 0.5 mg by nebulization every 6 (six) hours as needed.   isosorbide mononitrate 30 MG 24 hr tablet Commonly known as:  IMDUR Take 30 mg by mouth every morning.   loratadine 10 MG tablet Commonly known as:  CLARITIN Take 1 tablet by mouth daily.   magnesium oxide 400 MG tablet Commonly known as:  MAG-OX Take 400 mg by mouth daily.   metFORMIN 500 MG 24 hr tablet Commonly known as:  GLUCOPHAGE-XR Take 1  tablet by mouth daily.   nitroGLYCERIN 0.4 MG SL tablet Commonly known as:  NITROSTAT Place 0.4 mg under the tongue every 5 (five) minutes as needed. Reported on 06/14/2016   omega-3 acid ethyl esters 1 g capsule Commonly known as:  LOVAZA Take by mouth 2 (two) times daily.   ONE-A-DAY 50 PLUS PO Take 1 tablet by mouth daily.   oxybutynin 5 MG tablet Commonly known as:  DITROPAN Take 1 tablet (5 mg total) by mouth 3 (three) times daily as needed for bladder spasms.   phenazopyridine 200 MG tablet Commonly known as:  PYRIDIUM Take  1 tablet (200 mg total) by mouth 3 (three) times daily as needed for pain.   potassium chloride SA 20 MEQ tablet Commonly known as:  K-DUR,KLOR-CON Take 20 mEq by mouth every morning.   tiZANidine 4 MG tablet Commonly known as:  ZANAFLEX Take 4 mg by mouth every 8 (eight) hours as needed for muscle spasms.   VITAMIN D3 PO Take 1 capsule by mouth daily.       Pending Test Results: None  Discharge Instructions: \ Discharge Instructions    (HEART FAILURE PATIENTS) Call MD:  Anytime you have any of the following symptoms: 1) 3 pound weight gain in 24 hours or 5 pounds in 1 week 2) shortness of breath, with or without a dry hacking cough 3) swelling in the hands, feet or stomach 4) if you have to sleep on extra pillows at night in order to breathe.    Complete by:  As directed    Call MD for:  persistant nausea and vomiting    Complete by:  As directed    Call MD for:  severe uncontrolled pain    Complete by:  As directed    Call MD for:  temperature >100.4    Complete by:  As directed    Diet - low sodium heart healthy    Complete by:  As directed    Increase activity slowly    Complete by:  As directed

## 2017-08-11 NOTE — Progress Notes (Signed)
PROGRESS NOTE  Sara Tran NOI:[id number] DOB: [birth year] DOA: 08/08/2017 PCP: Sharilyn Sites, MD   LOS: 1 day   Brief Narrative / Interim history: 61 year old female with history of morbid obesity, obstructive sleep apnea on CPAP, hypertension, prior history of lap banding in 2011, diabetes, pulmonary hypertension, hyperlipidemia, chronic kidney disease, who is being admitted to urology service for a right staghorn stone status post right cystoscopy with removal of right double-J stent.  Postprocedure she developed fever requiring in hospital stay.  Assessment & Plan: Active Problems:   Chronic diastolic heart failure (HCC)   Morbid obesity (HCC)   Diabetes mellitus ([place])   Chest pain   Acute on chronic renal insufficiency   Sepsis due to urinary tract infection ([place])   Staghorn kidney stones   Sepsis due to urinary tract infection  -Urinalysis consistent with infection, patient started on broad-spectrum antibiotics with vancomycin and Zosyn -fever curve a lot better, afebrile now for 24h -cultures negative, WBC improving -medically she appears stable today, defer to primary timing of discharge  HTN -add back amlodipine today. Would hold ARB on discharge due to CKD  Diabetes mellitus  -most recent hemoglobin A1c was 6.8 on July 2018.  Hold home oral agents and place patient on sliding scale insulin  Acute on chronic renal failure, underlying chronic kidney disease stage III  -Cr stable today   Chest tightness -Patient reported chest pain initially on my evaluation, this is chronic, related to her underlying asthma as it is associated with wheezing -resolved, cardiac enzymes negative, EKG non ischemic  Chronic diastolic CHF  -appears euvolemic, on room air  Obstructive sleep apnea  -nightly CPAP    DVT prophylaxis: SCD Code Status: Full code Family Communication: no family bedside Disposition Plan: per Urology  Procedures:  8/9 right cystoscopy with removal of  right double-J stent, right second-look ureteroscopy with laser tripsy, and insertion of right double-J stent by Dr. Jeffie Pollock  Antimicrobials:  Vancomycin 8/9 >>  Zosyn 8/9 >>   Subjective: -feels better, flank and groin pain present still. Mild nausea but no vomiting, able to eat  Objective: Vitals:   08/10/17 1343 08/10/17 2303 08/11/17 0030 08/11/17 0446  BP: (!) 148/70 (!) 171/79 (!) 153/73 (!) 154/77  Pulse: 99 100  90  Resp: 16 18  20   Temp: 99.2 F (37.3 C) 99 F (37.2 C)  98.5 F (36.9 C)  TempSrc: Oral Oral  Oral  SpO2: 97% 96% 96% 98%  Weight:      Height:        Intake/Output Summary (Last 24 hours) at 08/11/17 0713 Last data filed at 08/11/17 0600  Gross per 24 hour  Intake             3600 ml  Output             1650 ml  Net             1950 ml   Filed Weights   08/08/17 0526  Weight: 124.9 kg (275 lb 6 oz)    Examination:  Vitals:   08/10/17 1343 08/10/17 2303 08/11/17 0030 08/11/17 0446  BP: (!) 148/70 (!) 171/79 (!) 153/73 (!) 154/77  Pulse: 99 100  90  Resp: 16 18  20   Temp: 99.2 F (37.3 C) 99 F (37.2 C)  98.5 F (36.9 C)  TempSrc: Oral Oral  Oral  SpO2: 97% 96% 96% 98%  Weight:      Height:  Constitutional: NAD Eyes: no scleral icterus Respiratory: CTA biL, no wheezing, no crackles Cardiovascular: RRR, no murmurs. No LE edema Abdomen: mild tenderness on right quadrants, unchanged. BS + Neurologic: non focal   Data Reviewed: I have independently reviewed following labs and imaging studies   CBC:  Recent Labs Lab 08/08/17 1545 08/09/17 0422 08/10/17 0510 08/11/17 0532  WBC 19.0* 15.2* 13.8* 13.4*  NEUTROABS 16.8*  --   --   --   HGB 11.2* 10.3* 9.4* 9.9*  HCT 34.4* 31.5* 30.1* 30.6*  MCV 93.7 93.5 94.4 93.9  PLT 316 270 281 762   Basic Metabolic Panel:  Recent Labs Lab 08/08/17 1545 08/09/17 0422 08/10/17 0510 08/11/17 0532  NA 137 139 139 139  K 4.5 4.8 4.6 4.2  CL 103 109 107 104  CO2 24 23 22 24     GLUCOSE 193* 209* 208* 183*  BUN 19 15 13 13   CREATININE 1.72* 1.76* 1.89* 1.84*  CALCIUM 9.0 8.5* 8.8* 9.2   GFR: Estimated Creatinine Clearance: 38.7 mL/min (A) (by C-G formula based on SCr of 1.84 mg/dL (H)). Liver Function Tests:  Recent Labs Lab 08/08/17 1545 08/09/17 0422  AST 23 26  ALT 18 21  ALKPHOS 42 36*  BILITOT 0.5 0.8  PROT 7.4 6.6  ALBUMIN 3.7 3.2*   No results for input(s): LIPASE, AMYLASE in the last 168 hours. No results for input(s): AMMONIA in the last 168 hours. Coagulation Profile:  Recent Labs Lab 08/08/17 1545  INR 1.10   Cardiac Enzymes:  Recent Labs Lab 08/08/17 1545 08/08/17 2200 08/09/17 0422  TROPONINI <0.03 <0.03 <0.03   BNP (last 3 results) No results for input(s): PROBNP in the last 8760 hours. HbA1C: No results for input(s): HGBA1C in the last 72 hours. CBG:  Recent Labs Lab 08/09/17 2114 08/10/17 0740 08/10/17 1108 08/10/17 1624 08/10/17 2255  GLUCAP 168* 164* 147* 143* 190*   Lipid Profile: No results for input(s): CHOL, HDL, LDLCALC, TRIG, CHOLHDL, LDLDIRECT in the last 72 hours. Thyroid Function Tests: No results for input(s): TSH, T4TOTAL, FREET4, T3FREE, THYROIDAB in the last 72 hours. Anemia Panel: No results for input(s): VITAMINB12, FOLATE, FERRITIN, TIBC, IRON, RETICCTPCT in the last 72 hours. Urine analysis:    Component Value Date/Time   COLORURINE YELLOW 08/08/2017 1500   APPEARANCEUR CLOUDY (A) 08/08/2017 1500   APPEARANCEUR Clear 06/18/2017 1655   LABSPEC 1.014 08/08/2017 1500   PHURINE 5.0 08/08/2017 1500   GLUCOSEU NEGATIVE 08/08/2017 1500   HGBUR LARGE (A) 08/08/2017 1500   BILIRUBINUR NEGATIVE 08/08/2017 1500   BILIRUBINUR Negative 06/18/2017 1655   KETONESUR NEGATIVE 08/08/2017 1500   PROTEINUR 100 (A) 08/08/2017 1500   UROBILINOGEN 0.2 09/09/2013 1911   NITRITE NEGATIVE 08/08/2017 1500   LEUKOCYTESUR LARGE (A) 08/08/2017 1500   LEUKOCYTESUR 3+ (A) 06/18/2017 1655   Sepsis  Labs: Invalid input(s): PROCALCITONIN, LACTICIDVEN  Recent Results (from the past 240 hour(s))  Stone analysis     Status: None   Collection Time: 08/01/17 10:23 AM  Result Value Ref Range Status   Color Tan  Final   Size Comment mm Final    Comment: Specimen received as fragments.   Stone Weight KSTONE 97.0 mg Corrected   Nidus No Nidus visualized  Corrected   Ca Oxalate,Monohydr. 35 % Corrected   Ca phos cry stone ql IR 30 % Corrected   Magnesium Ammon Phos 15 % Corrected   Ammonium Acid Urate 20 % Corrected   Composition Comment  Corrected    Comment:  Percentage (Represents the % composition)   STONE COMMENT Note:  Corrected    Comment: (NOTE) Please do not submit specimens on Q-Tips, in tape, on filters, or in liquids such as blood, urine or formalin.  This may cause unnecessary biohazards, erroneous results and/or delay in the processing of the specimen.    Photo Comment  Corrected    Comment: Photograph will follow under separate cover.   Comment: Comment  Corrected    Comment: (NOTE) Physician questions regarding Calculi Analysis contact [facility] at: [phone].    PLEASE NOTE: Comment  Corrected    Comment: (NOTE) Calculi report with photograph will follow via computer, mail or courier delivery.    Disclaimer - Kidney Stone Analysis: Comment  Corrected    Comment: (NOTE) This test was developed and its performance characteristics determined by [facility]. It has not been cleared or approved by the Food and Drug Administration. Performed At: [facility] [place], [place] [id number] Lindon Romp MD RW:[id number]   Urine culture     Status: Abnormal   Collection Time: 08/08/17  3:00 PM  Result Value Ref Range Status   Specimen Description URINE, RANDOM  Final   Special Requests NONE  Final   Culture (A)  Final    <10,000 COLONIES/mL INSIGNIFICANT GROWTH Performed at [facility], [place] [address] [place], [place] [address]     Report Status 08/10/2017 FINAL  Final  Culture, blood (routine x 2)     Status: None (Preliminary result)   Collection Time: 08/08/17  3:45 PM  Result Value Ref Range Status   Specimen Description BLOOD LEFT ANTECUBITAL  Final   Special Requests IN PEDIATRIC BOTTLE Blood Culture adequate volume  Final   Culture   Final    NO GROWTH 2 DAYS Performed at [facility], [place] [address] [place], [place] [address]    Report Status PENDING  Incomplete  Culture, blood (routine x 2)     Status: None (Preliminary result)   Collection Time: 08/08/17  3:52 PM  Result Value Ref Range Status   Specimen Description BLOOD LEFT ANTECUBITAL  Final   Special Requests IN PEDIATRIC BOTTLE Blood Culture adequate volume  Final   Culture   Final    NO GROWTH 2 DAYS Performed at [facility], [place] [address] [place], [place] [address]    Report Status PENDING  Incomplete  C difficile quick scan w PCR reflex     Status: None   Collection Time: 08/10/17 12:14 PM  Result Value Ref Range Status   C Diff antigen NEGATIVE NEGATIVE Final   C Diff toxin NEGATIVE NEGATIVE Final   C Diff interpretation No C. difficile detected.  Final      Radiology Studies: No results found.   Scheduled Meds: . amLODipine  10 mg Oral Daily  . budesonide  0.25 mg Nebulization BID  . fenofibrate  160 mg Oral Daily  . fluticasone  1 spray Each Nare Daily  . gabapentin  300 mg Oral TID  . insulin aspart  0-20 Units Subcutaneous TID WC  . insulin aspart  0-5 Units Subcutaneous QHS  . isosorbide mononitrate  30 mg Oral Daily  . loratadine  10 mg Oral Daily  . magnesium oxide  400 mg Oral Daily  . potassium chloride SA  20 mEq Oral q morning - 10a   Continuous Infusions: . piperacillin-tazobactam (ZOSYN)  IV 3.375 g (08/11/17 0511)  . vancomycin Stopped (08/10/17 1637)  Marzetta Board, MD, PhD [facility] Pager [phone]  If 7PM-7AM, please contact night-coverage [email or web address] Password  [facility] 08/11/2017, 7:13 AM

## 2017-08-11 NOTE — Care Management Note (Signed)
Case Management Note  Patient Details  Name: Sara Tran MRN: [id number] Date of Birth: May 16, 1956  Subjective/Objective:   Right Ureteroscopy with holmium laser right stent placement 08/08/2017                 Action/Plan: Discharge Planning: Chart reviewed. Pt requesting 3n1 bedside commode for home. Pt states she has RW at home.   PCP Sharilyn Sites MD    Expected Discharge Date:  08/11/17               Expected Discharge Plan:  Home/Self Care  In-House Referral:  NA  Discharge planning Services  CM Consult  Post Acute Care Choice:  NA Choice offered to:  NA  DME Arranged:  3-N-1 DME Agency:  [place]:  NA [place] Agency:  NA  Status of Service:  Completed, signed off  If discussed at [place] of Stay Meetings, dates discussed:    Additional Comments:  Erenest Rasher, RN 08/11/2017, 12:41 PM

## 2017-08-11 NOTE — Progress Notes (Signed)
Went over discharge paperwork with patient.  All questions answered.  Prescriptions and discharge papers given to patient.  VSS.  Pt ambulated in the hall with no issues on RA sating in the mid 90's.  Pt discharged via wheelchair.

## 2017-08-13 LAB — CULTURE, BLOOD (ROUTINE X 2)
Culture: NO GROWTH
Culture: NO GROWTH
Special Requests: ADEQUATE
Special Requests: ADEQUATE

## 2017-08-15 ENCOUNTER — Ambulatory Visit (HOSPITAL_COMMUNITY)
Admission: RE | Admit: 2017-08-15 | Discharge: 2017-08-15 | Disposition: A | Discharge status: Home / Self Care | Payer: Medicare HMO | Source: Ambulatory Visit | Attending: Urology | Admitting: Urology

## 2017-08-15 ENCOUNTER — Other Ambulatory Visit: Payer: Self-pay | Admitting: Urology

## 2017-08-15 DIAGNOSIS — N200 Calculus of kidney: Secondary | ICD-10-CM | POA: Insufficient documentation

## 2017-08-15 DIAGNOSIS — D259 Leiomyoma of uterus, unspecified: Secondary | ICD-10-CM | POA: Insufficient documentation

## 2017-08-15 DIAGNOSIS — Z9884 Bariatric surgery status: Secondary | ICD-10-CM | POA: Diagnosis not present

## 2017-08-16 ENCOUNTER — Other Ambulatory Visit (HOSPITAL_COMMUNITY)
Admission: RE | Admit: 2017-08-16 | Discharge: 2017-08-16 | Disposition: A | Discharge status: Home / Self Care | Payer: Medicare HMO | Source: Other Acute Inpatient Hospital | Attending: Urology | Admitting: Urology

## 2017-08-16 ENCOUNTER — Ambulatory Visit (INDEPENDENT_AMBULATORY_CARE_PROVIDER_SITE_OTHER): Payer: Self-pay | Admitting: Urology

## 2017-08-16 ENCOUNTER — Other Ambulatory Visit: Payer: Self-pay | Admitting: Urology

## 2017-08-16 DIAGNOSIS — N390 Urinary tract infection, site not specified: Secondary | ICD-10-CM

## 2017-08-16 DIAGNOSIS — N200 Calculus of kidney: Secondary | ICD-10-CM

## 2017-08-16 LAB — URINALYSIS, COMPLETE (UACMP) WITH MICROSCOPIC
Bilirubin Urine: NEGATIVE
Glucose, UA: NEGATIVE mg/dL
Ketones, ur: NEGATIVE mg/dL
Nitrite: POSITIVE — AB
Protein, ur: 100 mg/dL — AB
Specific Gravity, Urine: 1.018 (ref 1.005–1.030)
pH: 5 (ref 5.0–8.0)

## 2017-08-18 LAB — URINE CULTURE: Culture: <10000 — AB

## 2017-08-19 ENCOUNTER — Other Ambulatory Visit: Payer: Self-pay | Admitting: Urology

## 2017-08-19 ENCOUNTER — Ambulatory Visit (HOSPITAL_COMMUNITY)
Admission: RE | Admit: 2017-08-19 | Discharge: 2017-08-19 | Disposition: A | Discharge status: Home / Self Care | Payer: Medicare HMO | Source: Ambulatory Visit | Attending: Urology | Admitting: Urology

## 2017-08-19 ENCOUNTER — Encounter (HOSPITAL_COMMUNITY): Payer: Self-pay | Admitting: *Deleted

## 2017-08-19 DIAGNOSIS — N200 Calculus of kidney: Secondary | ICD-10-CM

## 2017-08-19 DIAGNOSIS — N132 Hydronephrosis with renal and ureteral calculous obstruction: Secondary | ICD-10-CM | POA: Diagnosis not present

## 2017-08-19 DIAGNOSIS — Z87442 Personal history of urinary calculi: Secondary | ICD-10-CM | POA: Diagnosis not present

## 2017-08-19 DIAGNOSIS — N1330 Unspecified hydronephrosis: Secondary | ICD-10-CM | POA: Diagnosis not present

## 2017-08-19 NOTE — Progress Notes (Signed)
Called [facility] to see if MD wanted to order antibiotic prior to ESWL on 08/22/17.

## 2017-08-20 DIAGNOSIS — G4733 Obstructive sleep apnea (adult) (pediatric): Secondary | ICD-10-CM | POA: Diagnosis not present

## 2017-08-21 DIAGNOSIS — G4710 Hypersomnia, unspecified: Secondary | ICD-10-CM | POA: Diagnosis not present

## 2017-08-21 DIAGNOSIS — G4733 Obstructive sleep apnea (adult) (pediatric): Secondary | ICD-10-CM | POA: Diagnosis not present

## 2017-08-21 DIAGNOSIS — M159 Polyosteoarthritis, unspecified: Secondary | ICD-10-CM | POA: Diagnosis not present

## 2017-08-21 MED ORDER — GENTAMICIN SULFATE 40 MG/ML IJ SOLN
380.0000 mg | INTRAVENOUS | Status: AC
  Administered 2017-08-22: 380 mg via INTRAVENOUS
  Filled 2017-08-21: qty 9.5

## 2017-08-22 ENCOUNTER — Ambulatory Visit (HOSPITAL_COMMUNITY)
Admission: RE | Admit: 2017-08-22 | Discharge: 2017-08-22 | Disposition: A | Discharge status: Home / Self Care | Payer: Medicare HMO | Source: Ambulatory Visit | Attending: Urology | Admitting: Urology

## 2017-08-22 ENCOUNTER — Encounter (HOSPITAL_COMMUNITY)
Admission: RE | Disposition: A | Discharge status: Home / Self Care | Payer: Self-pay | Source: Ambulatory Visit | Attending: Urology

## 2017-08-22 ENCOUNTER — Ambulatory Visit (HOSPITAL_COMMUNITY): Payer: Medicare HMO

## 2017-08-22 ENCOUNTER — Encounter (HOSPITAL_COMMUNITY): Payer: Self-pay | Admitting: *Deleted

## 2017-08-22 DIAGNOSIS — Z886 Allergy status to analgesic agent status: Secondary | ICD-10-CM | POA: Insufficient documentation

## 2017-08-22 DIAGNOSIS — G8929 Other chronic pain: Secondary | ICD-10-CM | POA: Diagnosis not present

## 2017-08-22 DIAGNOSIS — I110 Hypertensive heart disease with heart failure: Secondary | ICD-10-CM | POA: Insufficient documentation

## 2017-08-22 DIAGNOSIS — K589 Irritable bowel syndrome without diarrhea: Secondary | ICD-10-CM | POA: Insufficient documentation

## 2017-08-22 DIAGNOSIS — K648 Other hemorrhoids: Secondary | ICD-10-CM | POA: Insufficient documentation

## 2017-08-22 DIAGNOSIS — E119 Type 2 diabetes mellitus without complications: Secondary | ICD-10-CM | POA: Diagnosis not present

## 2017-08-22 DIAGNOSIS — F419 Anxiety disorder, unspecified: Secondary | ICD-10-CM | POA: Diagnosis not present

## 2017-08-22 DIAGNOSIS — B009 Herpesviral infection, unspecified: Secondary | ICD-10-CM | POA: Insufficient documentation

## 2017-08-22 DIAGNOSIS — Z87442 Personal history of urinary calculi: Secondary | ICD-10-CM | POA: Diagnosis not present

## 2017-08-22 DIAGNOSIS — J439 Emphysema, unspecified: Secondary | ICD-10-CM | POA: Diagnosis not present

## 2017-08-22 DIAGNOSIS — K219 Gastro-esophageal reflux disease without esophagitis: Secondary | ICD-10-CM | POA: Insufficient documentation

## 2017-08-22 DIAGNOSIS — N200 Calculus of kidney: Secondary | ICD-10-CM | POA: Insufficient documentation

## 2017-08-22 DIAGNOSIS — Z91040 Latex allergy status: Secondary | ICD-10-CM | POA: Insufficient documentation

## 2017-08-22 DIAGNOSIS — Z9884 Bariatric surgery status: Secondary | ICD-10-CM | POA: Insufficient documentation

## 2017-08-22 DIAGNOSIS — Z87891 Personal history of nicotine dependence: Secondary | ICD-10-CM | POA: Diagnosis not present

## 2017-08-22 DIAGNOSIS — N289 Disorder of kidney and ureter, unspecified: Secondary | ICD-10-CM | POA: Insufficient documentation

## 2017-08-22 DIAGNOSIS — Z881 Allergy status to other antibiotic agents status: Secondary | ICD-10-CM | POA: Insufficient documentation

## 2017-08-22 DIAGNOSIS — I509 Heart failure, unspecified: Secondary | ICD-10-CM | POA: Insufficient documentation

## 2017-08-22 DIAGNOSIS — I209 Angina pectoris, unspecified: Secondary | ICD-10-CM | POA: Diagnosis not present

## 2017-08-22 DIAGNOSIS — G4730 Sleep apnea, unspecified: Secondary | ICD-10-CM | POA: Diagnosis not present

## 2017-08-22 DIAGNOSIS — K449 Diaphragmatic hernia without obstruction or gangrene: Secondary | ICD-10-CM | POA: Insufficient documentation

## 2017-08-22 DIAGNOSIS — M25559 Pain in unspecified hip: Secondary | ICD-10-CM | POA: Diagnosis not present

## 2017-08-22 DIAGNOSIS — M549 Dorsalgia, unspecified: Secondary | ICD-10-CM | POA: Diagnosis not present

## 2017-08-22 DIAGNOSIS — Z91048 Other nonmedicinal substance allergy status: Secondary | ICD-10-CM | POA: Insufficient documentation

## 2017-08-22 DIAGNOSIS — Z91011 Allergy to milk products: Secondary | ICD-10-CM | POA: Insufficient documentation

## 2017-08-22 HISTORY — DX: Personal history of other diseases of the respiratory system: Z87.09

## 2017-08-22 HISTORY — PX: EXTRACORPOREAL SHOCK WAVE LITHOTRIPSY: SHX1557

## 2017-08-22 HISTORY — DX: Atherosclerotic heart disease of native coronary artery without angina pectoris: I25.10

## 2017-08-22 HISTORY — DX: Acute myocardial infarction, unspecified: I21.9

## 2017-08-22 HISTORY — DX: Angina pectoris, unspecified: I20.9

## 2017-08-22 LAB — GLUCOSE, CAPILLARY: Glucose-Capillary: 196 mg/dL — ABNORMAL HIGH (ref 65–99)

## 2017-08-22 SURGERY — LITHOTRIPSY, ESWL
Anesthesia: LOCAL | Laterality: Right

## 2017-08-22 MED ORDER — HYDROMORPHONE HCL 2 MG PO TABS
2.0000 mg | ORAL_TABLET | Freq: Once | ORAL | Status: AC
  Administered 2017-08-22: 2 mg via ORAL
  Filled 2017-08-22: qty 1

## 2017-08-22 MED ORDER — DIAZEPAM 5 MG PO TABS
10.0000 mg | ORAL_TABLET | ORAL | Status: AC
  Administered 2017-08-22: 10 mg via ORAL
  Filled 2017-08-22: qty 2

## 2017-08-22 MED ORDER — HYDROMORPHONE HCL 2 MG PO TABS: 2.0000 mg | ORAL_TABLET | ORAL | 0 refills | Status: DC | PRN

## 2017-08-22 MED ORDER — DIPHENHYDRAMINE HCL 25 MG PO CAPS
25.0000 mg | ORAL_CAPSULE | ORAL | Status: AC
  Administered 2017-08-22: 25 mg via ORAL
  Filled 2017-08-22: qty 1

## 2017-08-22 MED ORDER — ONDANSETRON HCL 4 MG PO TABS: 4.0000 mg | ORAL_TABLET | Freq: Three times a day (TID) | ORAL | 0 refills | Status: DC | PRN

## 2017-08-22 MED ORDER — SODIUM CHLORIDE 0.9 % IV SOLN
INTRAVENOUS | Status: DC
  Administered 2017-08-22: 08:00:00 via INTRAVENOUS

## 2017-08-22 MED ORDER — HYDROMORPHONE HCL-NACL 0.5-0.9 MG/ML-% IV SOSY: 1.0000 mg | PREFILLED_SYRINGE | Freq: Once | INTRAVENOUS | Status: DC

## 2017-08-22 MED ORDER — HYDROMORPHONE HCL 1 MG/ML IJ SOLN
1.0000 mg | Freq: Once | INTRAMUSCULAR | Status: AC
Start: 2017-08-22 — End: 2017-08-22
  Administered 2017-08-22: 1 mg via INTRAVENOUS
  Filled 2017-08-22: qty 1

## 2017-08-22 MED ORDER — HYDROMORPHONE BOLUS VIA INFUSION: 1.0000 mg | Freq: Once | INTRAVENOUS | Status: DC

## 2017-08-22 NOTE — H&P (Signed)
Sara Tran is an 61 y.o. female.    Chief Complaint: Pre-OP RIGHT Shockwave Lithotripsy  HPI:   1 - RIGHT Partial Staghorn Stone - s/p RIGH ureteroscopy 08/08/2017 by Jeffie Pollock  for partial staghorn stone in setting of extreme obesity (not PCNL candidate). Majority of stone removed except for 40mm lower pole area. Prior UCX proteus sens Augmentin which sha is on per-op. Most recent UCX non-slonal / scant growth. No interval fevers.  Her stone is 28mm, 600HU, SSD 20cm.  Today "Sara Tran" is seen to proceed with RIGHT shockwave lithotripsy for residual Rt lower pole stone.   Past Medical History:  Diagnosis Date  . Adnexal cyst    Right simple cyst seen on [place]  . Anxiety   . Arthritis   . Asthma   . Atrial fibrillation (HCC)    hx of   . Chronic back pain   . Chronic hip pain   . COPD (chronic obstructive pulmonary disease) ([place])   . Dyspnea   . GERD (gastroesophageal reflux disease)   . Headache    due to pinched nerve damaage   . Heart murmur    hx of slight murmur   . Herpes simplex virus (HSV) infection   . History of cardiac catheterization    Normal coronaries 2013  . History of kidney stones   . Hot flashes   . Hypertension   . Hypertriglyceridemia   . IBS (irritable bowel syndrome)   . Internal hemorrhoids   . Morbid obesity ([place])    s/p lap band surgery  . Obesity   . Pulmonary HTN ([place])    [place] 2008:  RA pressures 13/10 with a mean of 7 mmHg.  RV pressure 65/78 with end diastolic pressure of 15 mmHg.  PA pressure 49/23 with a mean of 35 mmHg.  Pulmonary capillary wedge 17/50 with a mean of 14 mmHg. The PA saturation was 68%.  RA saturation was 71% and aortic saturation was 90%.  Cardiac output was 6.0 with a cardiac index of 2.80 by [place].  Normal coronaries by cath 2008.  Marland Kitchen Renal insufficiency   . Sciatica of right side   . Sleep apnea    CPAP use does not know settings   . Type 2 diabetes mellitus ([place])    type II   . Uterine fibroid     Past Surgical History:   Procedure Laterality Date  . CARDIAC CATHETERIZATION  07/2007, 05/2012   Normal coronary arteries  . COLONOSCOPY  10/18/10   SLF:2-mm sessile sigmoid polyp/diverticula  . CYSTOSCOPY/URETEROSCOPY/HOLMIUM LASER/STENT PLACEMENT Right 08/01/2017   Procedure: CYSTOSCOPY/ RIGHT URETEROSCOPY/ RIGHT RETROGRADE/ HOLMIUM LASER  AND RIGHT STENT PLACEMENT FIRST STAGE;  Surgeon: Irine Seal, MD;  Location: [facility];  Service: Urology;  Laterality: Right;  . CYSTOSCOPY/URETEROSCOPY/HOLMIUM LASER/STENT PLACEMENT Right 08/08/2017   Procedure: RIGHT URETEROSCOPY WITH HOLMIUM LASER RIGHT STENT PLACEMENT;  Surgeon: Irine Seal, MD;  Location: [facility];  Service: Urology;  Laterality: Right;  . HEMORRHOID BANDING  07/2012   Dr. Oneida Alar  . LAPAROSCOPIC CHOLECYSTECTOMY  1985  . LAPAROSCOPIC GASTRIC BANDING  12/12/2010  . LEFT AND RIGHT HEART CATHETERIZATION WITH CORONARY ANGIOGRAM N/A 06/03/2012   Procedure: LEFT AND RIGHT HEART CATHETERIZATION WITH CORONARY ANGIOGRAM;  Surgeon: Jolaine Artist, MD;  Location: [facility] CATH LAB;  Service: Cardiovascular;  Laterality: N/A;  . PILONIDAL CYST EXCISION  1974  . 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 TUMOR EXCISION  10/2011   Face    Family History  Problem Relation Age of Onset  . Diabetes Mother   . Heart failure Mother   . Breast cancer Sister   . CAD Father   . Hypertension Father   . Cancer Paternal Grandfather   . Hypertension Paternal Grandmother   . Stroke Paternal Grandmother   . Other Maternal Grandmother        tonsilitis  . Diabetes Maternal Grandfather   . Hypertension Maternal Grandfather   . Breast cancer Paternal Aunt    Social History:  reports that she quit smoking about 21 years ago. Her smoking use included Cigarettes. She has never used smokeless tobacco. She reports that she does not drink alcohol or use drugs.  Allergies:  Allergies  Allergen Reactions  . Bee Venom Anaphylaxis    As a child  . Ancef [Cefazolin] Other (See Comments)     Patient was told she was allergic but doesn't remember what happens  . Aspirin Other (See Comments)    Rectal bleeding  . Bactrim [Sulfamethoxazole-Trimethoprim] Other (See Comments)    Cramping,bloating,vomiting,fever,headaches  . Ceftin [Cefuroxime Axetil] Swelling    Swollen tongue, "made me crazy"  . Ciprofloxacin Cough    Coughed up blood  . Macrodantin [Nitrofurantoin] Other (See Comments)    Vomited blood and had dark stool.  . Milk-Related Compounds Other (See Comments)    IBS  . Other     Snake venom - not sure of reaction -had problems with feet   . Latex Itching and Rash    Medications Prior to Admission  Medication Sig Dispense Refill  . albuterol (PROVENTIL HFA;VENTOLIN HFA) 108 (90 BASE) MCG/ACT inhaler Inhale 2 puffs into the lungs every 6 (six) hours as needed. FOR SHORTNESS OF BREATH/WHEEZING    . albuterol (PROVENTIL) (2.5 MG/3ML) 0.083% nebulizer solution Take 2.5 mg by nebulization every 4 (four) hours as needed for wheezing or shortness of breath.     . ALPRAZolam (XANAX) 0.5 MG tablet Take 0.5 mg by mouth 3 (three) times daily as needed. For anxiety/sleep    . amLODipine (NORVASC) 10 MG tablet Take 1 tablet by mouth every evening.     . budesonide (PULMICORT) 180 MCG/ACT inhaler Inhale 2 puffs into the lungs 2 (two) times daily.    . carisoprodol (SOMA) 350 MG tablet TAKE 1 TABLET 4 TIMES A DAY AS NEEDED FOR MUSCLE SPASM. 30 tablet 1  . Cholecalciferol (VITAMIN D3 PO) Take 1 capsule by mouth daily.    . fenofibrate 160 MG tablet Take 160 mg by mouth daily.  0  . fluticasone (FLONASE) 50 MCG/ACT nasal spray Place 1-2 sprays into the nose daily.     Marland Kitchen gabapentin (NEURONTIN) 300 MG capsule Take 300 mg by mouth 3 (three) times daily.    Marland Kitchen HYDROmorphone (DILAUDID) 2 MG tablet Take 1 tablet (2 mg total) by mouth every 4 (four) hours as needed for severe pain. 24 tablet 0  . ipratropium (ATROVENT) 0.02 % nebulizer solution Take 0.5 mg by nebulization every 6 (six) hours  as needed.     . isosorbide mononitrate (IMDUR) 30 MG 24 hr tablet Take 30 mg by mouth every morning.     . loratadine (CLARITIN) 10 MG tablet Take 1 tablet by mouth daily.    . magnesium oxide (MAG-OX) 400 MG tablet Take 400 mg by mouth daily.    . metFORMIN (GLUCOPHAGE-XR) 500 MG 24 hr tablet Take 1 tablet by mouth daily.    . Multiple Vitamins-Minerals (ONE-A-DAY 50 PLUS PO) Take 1 tablet by  mouth daily.    . nitroGLYCERIN (NITROSTAT) 0.4 MG SL tablet Place 0.4 mg under the tongue every 5 (five) minutes as needed. Reported on 06/14/2016    . omega-3 acid ethyl esters (LOVAZA) 1 g capsule Take by mouth 2 (two) times daily.    Marland Kitchen oxybutynin (DITROPAN) 5 MG tablet Take 1 tablet (5 mg total) by mouth 3 (three) times daily as needed for bladder spasms. 30 tablet 0  . potassium chloride SA (K-DUR,KLOR-CON) 20 MEQ tablet Take 20 mEq by mouth every morning.     Marland Kitchen tiZANidine (ZANAFLEX) 4 MG tablet Take 4 mg by mouth every 8 (eight) hours as needed for muscle spasms.      No results found for this or any previous visit (from the past 48 hour(s)). No results found.  Review of Systems  Constitutional: Negative for chills and fever.  HENT: Negative.   Eyes: Negative.   Respiratory: Negative.   Cardiovascular: Negative.   Gastrointestinal: Negative.   Genitourinary: Negative.   Musculoskeletal: Negative.   Skin: Negative.   Neurological: Negative.   Endo/Heme/Allergies: Negative.   Psychiatric/Behavioral: Negative.     Last menstrual period 06/06/2013. Physical Exam  Constitutional: She appears well-developed.  Eyes: Pupils are equal, round, and reactive to light.  Neck: Normal range of motion.  Cardiovascular: Normal rate.   Respiratory: Effort normal.  GI:  Morbid truncal obesity  Genitourinary:  Genitourinary Comments: NO CVAT at present  Musculoskeletal: Normal range of motion.  Neurological: She is alert.  Skin: Skin is warm.  Psychiatric: She has a normal mood and affect.      Assessment/Plan  1 - RIGHT Partial Staghorn Stone - proceed as planned with RIGHT shockwave lithotripsy. Risks, benefits, alternatives, expeted peri-procedure course discussed previously and reiterated today.   Alexis Frock, MD 08/22/2017, 6:35 AM

## 2017-08-22 NOTE — Brief Op Note (Signed)
08/22/2017  9:50 AM  PATIENT:  Sara Tran  61 y.o. female  PRE-OPERATIVE DIAGNOSIS:  RIGHT RENAL STONE  POST-OPERATIVE DIAGNOSIS:  * No post-op diagnosis entered *  PROCEDURE:  Procedure(s): RIGHT EXTRACORPOREAL SHOCK WAVE LITHOTRIPSY (ESWL) (Right)  SURGEON:  Surgeon(s) and Role:    Alexis Frock, MD - Primary  PHYSICIAN ASSISTANT:   ASSISTANTS: none   ANESTHESIA:   MAC  EBL:  No intake/output data recorded.  BLOOD ADMINISTERED:none  DRAINS: none   LOCAL MEDICATIONS USED:  NONE  SPECIMEN:  No Specimen  DISPOSITION OF SPECIMEN:  N/A  COUNTS:  YES  TOURNIQUET:  * No tourniquets in log *  DICTATION: .Note written in paper chart  PLAN OF CARE: Discharge to home after PACU  PATIENT DISPOSITION:  Short Stay   Delay start of Pharmacological VTE agent (>24hrs) due to surgical blood loss or risk of bleeding: yes

## 2017-08-22 NOTE — Discharge Instructions (Signed)
Moderate Conscious Sedation, Adult, Care After These instructions provide you with information about caring for yourself after your procedure. Your health care provider may also give you more specific instructions. Your treatment has been planned according to current medical practices, but problems sometimes occur. Call your health care provider if you have any problems or questions after your procedure. What can I expect after the procedure? After your procedure, it is common:  To feel sleepy for several hours.  To feel clumsy and have poor balance for several hours.  To have poor judgment for several hours.  To vomit if you eat too soon.  Follow these instructions at home: For at least 24 hours after the procedure:   Do not: ? Participate in activities where you could fall or become injured. ? Drive. ? Use heavy machinery. ? Drink alcohol. ? Take sleeping pills or medicines that cause drowsiness. ? Make important decisions or sign legal documents. ? Take care of children on your own.  Rest. Eating and drinking  Follow the diet recommended by your health care provider.  If you vomit: ? Drink water, juice, or soup when you can drink without vomiting. ? Make sure you have little or no nausea before eating solid foods. General instructions  Have a responsible adult stay with you until you are awake and alert.  Take over-the-counter and prescription medicines only as told by your health care provider.  If you smoke, do not smoke without supervision.  Keep all follow-up visits as told by your health care provider. This is important. Contact a health care provider if:  You keep feeling nauseous or you keep vomiting.  You feel light-headed.  You develop a rash.  You have a fever. Get help right away if:  You have trouble breathing. This information is not intended to replace advice given to you by your health care provider. Make sure you discuss any questions you have  with your health care provider. Document Released: 10/07/2013 Document Revised: 05/21/2016 Document Reviewed: 04/07/2016 Elsevier Interactive Patient Education  2018 [place] After This sheet gives you information about how to care for yourself after your procedure. Your health care provider may also give you more specific instructions. If you have problems or questions, contact your health care provider. What can I expect after the procedure? After the procedure, it is common to have:  Some blood in your urine. This should only last for a few days.  Soreness in your back, sides, or upper abdomen for a few days.  Blotches or bruises on your back where the pressure wave entered the skin.  Pain, discomfort, or nausea when pieces (fragments) of the kidney stone move through the tube that carries urine from the kidney to the bladder (ureter). Stone fragments may pass soon after the procedure, but they may continue to pass for up to 4-8 weeks. ? If you have severe pain or nausea, contact your health care provider. This may be caused by a large stone that was not broken up, and this may mean that you need more treatment.  Some pain or discomfort during urination.  Some pain or discomfort in the lower abdomen or (in men) at the base of the penis.  Follow these instructions at home: Medicines  Take over-the-counter and prescription medicines only as told by your health care provider.  If you were prescribed an antibiotic medicine, take it as told by your health care provider. Do not stop taking the antibiotic even  if you start to feel better.  Do not drive for 24 hours if you were given a medicine to help you relax (sedative).  Do not drive or use heavy machinery while taking prescription pain medicine. Eating and drinking  Drink enough water and fluids to keep your urine clear or pale yellow. This helps any remaining pieces of the stone to pass. It can also help  prevent new stones from forming.  Eat plenty of fresh fruits and vegetables.  Follow instructions from your health care provider about eating and drinking restrictions. You may be instructed: ? To reduce how much salt (sodium) you eat or drink. Check ingredients and nutrition facts on packaged foods and beverages. ? To reduce how much meat you eat.  Eat the recommended amount of calcium for your age and gender. Ask your health care provider how much calcium you should have. General instructions  Get plenty of rest.  Most people can resume normal activities 1-2 days after the procedure. Ask your health care provider what activities are safe for you.  If directed, strain all urine through the strainer that was provided by your health care provider. ? Keep all fragments for your health care provider to see. Any stones that are found may be sent to a medical lab for examination. The stone may be as small as a grain of salt.  Keep all follow-up visits as told by your health care provider. This is important. Contact a health care provider if:  You have pain that is severe or does not get better with medicine.  You have nausea that is severe or does not go away.  You have blood in your urine longer than your health care provider told you to expect.  You have more blood in your urine.  You have pain during urination that does not go away.  You urinate more frequently than usual and this does not go away.  You develop a rash or any other possible signs of an allergic reaction. Get help right away if:  You have severe pain in your back, sides, or upper abdomen.  You have severe pain while urinating.  Your urine is very dark red.  You have blood in your stool (feces).  You cannot pass any urine at all.  You feel a strong urge to urinate after emptying your bladder.  You have a fever or chills.  You develop shortness of breath, difficulty breathing, or chest pain.  You have  severe nausea that leads to persistent vomiting.  You faint. Summary  After this procedure, it is common to have some pain, discomfort, or nausea when pieces (fragments) of the kidney stone move through the tube that carries urine from the kidney to the bladder (ureter). If this pain or nausea is severe, however, you should contact your health care provider.  Most people can resume normal activities 1-2 days after the procedure. Ask your health care provider what activities are safe for you.  Drink enough water and fluids to keep your urine clear or pale yellow. This helps any remaining pieces of the stone to pass, and it can help prevent new stones from forming.  If directed, strain your urine and keep all fragments for your health care provider to see. Fragments or stones may be as small as a grain of salt.  Get help right away if you have severe pain in your back, sides, or upper abdomen or have severe pain while urinating. This information is not intended to  replace advice given to you by your health care provider. Make sure you discuss any questions you have with your health care provider. Document Released: 01/06/2008 Document Revised: 11/07/2016 Document Reviewed: 11/07/2016 Elsevier Interactive Patient Education  2017 [place].   Kidney Stones Kidney stones (urolithiasis) are rock-like masses that form inside of the kidneys. Kidneys are organs that make pee (urine). A kidney stone can cause very bad pain and can block the flow of pee. The stone usually leaves your body (passes) through your pee. You may need to have a doctor take out the stone. Follow these instructions at home: Eating and drinking  Drink enough fluid to keep your pee clear or pale yellow. This will help you pass the stone.  If told by your doctor, change the foods you eat (your diet). This may include: ? Limiting how much salt (sodium) you eat. ? Eating more fruits and vegetables. ? Limiting how much meat,  poultry, fish, and eggs you eat.  Follow instructions from your doctor about eating or drinking restrictions. General instructions  Collect pee samples as told by your doctor. You may need to collect a pee sample: ? 24 hours after a stone comes out. ? 8-12 weeks after a stone comes out, and every 6-12 months after that.  Strain your pee every time you pee (urinate), for as long as told. Use the strainer that your doctor recommends.  Do not throw out the stone. Keep it so that it can be tested by your doctor.  Take over-the-counter and prescription medicines only as told by your doctor.  Keep all follow-up visits as told by your doctor. This is important. You may need follow-up tests. Preventing kidney stones To prevent another kidney stone:  Drink enough fluid to keep your pee clear or pale yellow. This is the best way to prevent kidney stones.  Eat healthy foods.  Avoid certain foods as told by your doctor. You may be told to eat less protein.  Stay at a healthy weight.  Contact a doctor if:  You have pain that gets worse or does not get better with medicine. Get help right away if:  You have a fever or chills.  You get very bad pain.  You get new pain in your belly (abdomen).  You pass out (faint).  You cannot pee. This information is not intended to replace advice given to you by your health care provider. Make sure you discuss any questions you have with your health care provider. Document Released: 06/04/2008 Document Revised: 09/04/2016 Document Reviewed: 09/04/2016 Elsevier Interactive Patient Education  2017 [place] may have urinary urgency (bladder spasms), pass small stone fragments,  and bloody urine on / off x few days. This is normal.  2 - Call MD or go to ER for fever >102, severe pain / nausea / vomiting not relieved by medications, or acute change in medical status

## 2017-08-23 ENCOUNTER — Encounter (HOSPITAL_COMMUNITY): Payer: Self-pay | Admitting: Urology

## 2017-08-26 ENCOUNTER — Encounter: Payer: Medicare HMO | Admitting: Physical Medicine & Rehabilitation

## 2017-08-29 ENCOUNTER — Other Ambulatory Visit: Payer: Self-pay | Admitting: Urology

## 2017-08-29 DIAGNOSIS — N200 Calculus of kidney: Secondary | ICD-10-CM

## 2017-09-05 ENCOUNTER — Ambulatory Visit: Payer: Medicare HMO | Admitting: Endocrinology

## 2017-09-05 ENCOUNTER — Ambulatory Visit (HOSPITAL_COMMUNITY)
Admission: RE | Admit: 2017-09-05 | Discharge: 2017-09-05 | Disposition: A | Discharge status: Home / Self Care | Payer: Medicare HMO | Source: Ambulatory Visit | Attending: Urology | Admitting: Urology

## 2017-09-05 DIAGNOSIS — Z09 Encounter for follow-up examination after completed treatment for conditions other than malignant neoplasm: Secondary | ICD-10-CM | POA: Insufficient documentation

## 2017-09-05 DIAGNOSIS — N200 Calculus of kidney: Secondary | ICD-10-CM

## 2017-09-05 DIAGNOSIS — Z87442 Personal history of urinary calculi: Secondary | ICD-10-CM | POA: Diagnosis not present

## 2017-09-05 DIAGNOSIS — N1330 Unspecified hydronephrosis: Secondary | ICD-10-CM | POA: Diagnosis not present

## 2017-09-05 DIAGNOSIS — D259 Leiomyoma of uterus, unspecified: Secondary | ICD-10-CM | POA: Diagnosis not present

## 2017-09-11 DIAGNOSIS — G4733 Obstructive sleep apnea (adult) (pediatric): Secondary | ICD-10-CM | POA: Diagnosis not present

## 2017-09-27 ENCOUNTER — Other Ambulatory Visit (HOSPITAL_COMMUNITY)
Admission: RE | Admit: 2017-09-27 | Discharge: 2017-09-27 | Disposition: A | Discharge status: Home / Self Care | Payer: Medicare HMO | Source: Ambulatory Visit | Attending: Urology | Admitting: Urology

## 2017-09-27 ENCOUNTER — Ambulatory Visit (INDEPENDENT_AMBULATORY_CARE_PROVIDER_SITE_OTHER): Payer: Self-pay | Admitting: Urology

## 2017-09-27 DIAGNOSIS — N390 Urinary tract infection, site not specified: Secondary | ICD-10-CM | POA: Diagnosis not present

## 2017-09-27 DIAGNOSIS — N200 Calculus of kidney: Secondary | ICD-10-CM | POA: Diagnosis not present

## 2017-09-27 LAB — URINALYSIS, COMPLETE (UACMP) WITH MICROSCOPIC
Bilirubin Urine: NEGATIVE
Glucose, UA: NEGATIVE mg/dL
Hgb urine dipstick: NEGATIVE
Ketones, ur: NEGATIVE mg/dL
Nitrite: POSITIVE — AB
Protein, ur: 30 mg/dL — AB
Specific Gravity, Urine: 1.016 (ref 1.005–1.030)
pH: 5 (ref 5.0–8.0)

## 2017-09-30 ENCOUNTER — Other Ambulatory Visit: Payer: Self-pay | Admitting: Physical Medicine & Rehabilitation

## 2017-09-30 DIAGNOSIS — M47816 Spondylosis without myelopathy or radiculopathy, lumbar region: Secondary | ICD-10-CM

## 2017-09-30 DIAGNOSIS — M542 Cervicalgia: Secondary | ICD-10-CM

## 2017-09-30 DIAGNOSIS — M5416 Radiculopathy, lumbar region: Secondary | ICD-10-CM

## 2017-09-30 DIAGNOSIS — M7061 Trochanteric bursitis, right hip: Secondary | ICD-10-CM

## 2017-09-30 DIAGNOSIS — G894 Chronic pain syndrome: Secondary | ICD-10-CM

## 2017-09-30 LAB — URINE CULTURE: Culture: >=100000 — AB

## 2017-10-01 LAB — STONE ANALYSIS
Ammonium Acid Urate: 45 %
Ca Oxalate,Monohydr.: 5 %
Ca phos cry stone ql IR: 30 %
Magnesium Ammon Phos: 20 %
Stone Weight KSTONE: 349.2 mg

## 2017-10-01 NOTE — Telephone Encounter (Signed)
Refilled soma one month supply.  Needs an appt and I notified Ms Bents and she is scheduling that now.

## 2017-10-02 ENCOUNTER — Other Ambulatory Visit: Payer: Self-pay | Admitting: Urology

## 2017-10-02 DIAGNOSIS — N200 Calculus of kidney: Secondary | ICD-10-CM

## 2017-10-08 ENCOUNTER — Ambulatory Visit (HOSPITAL_COMMUNITY)
Admission: RE | Admit: 2017-10-08 | Discharge: 2017-10-08 | Disposition: A | Discharge status: Home / Self Care | Payer: Medicare HMO | Source: Ambulatory Visit | Attending: Urology | Admitting: Urology

## 2017-10-08 DIAGNOSIS — N200 Calculus of kidney: Secondary | ICD-10-CM | POA: Diagnosis not present

## 2017-10-08 DIAGNOSIS — I700 Atherosclerosis of aorta: Secondary | ICD-10-CM | POA: Insufficient documentation

## 2017-10-08 DIAGNOSIS — K760 Fatty (change of) liver, not elsewhere classified: Secondary | ICD-10-CM | POA: Diagnosis not present

## 2017-10-08 DIAGNOSIS — D259 Leiomyoma of uterus, unspecified: Secondary | ICD-10-CM | POA: Insufficient documentation

## 2017-10-08 DIAGNOSIS — M1288 Other specific arthropathies, not elsewhere classified, other specified site: Secondary | ICD-10-CM | POA: Insufficient documentation

## 2017-10-10 DIAGNOSIS — H5210 Myopia, unspecified eye: Secondary | ICD-10-CM | POA: Diagnosis not present

## 2017-10-10 DIAGNOSIS — E119 Type 2 diabetes mellitus without complications: Secondary | ICD-10-CM | POA: Diagnosis not present

## 2017-10-23 ENCOUNTER — Ambulatory Visit: Payer: Medicare HMO | Admitting: Endocrinology

## 2017-10-23 DIAGNOSIS — Z23 Encounter for immunization: Secondary | ICD-10-CM | POA: Diagnosis not present

## 2017-10-23 DIAGNOSIS — N390 Urinary tract infection, site not specified: Secondary | ICD-10-CM | POA: Diagnosis not present

## 2017-10-23 DIAGNOSIS — Z1389 Encounter for screening for other disorder: Secondary | ICD-10-CM | POA: Diagnosis not present

## 2017-10-23 DIAGNOSIS — Z6841 Body Mass Index (BMI) 40.0 and over, adult: Secondary | ICD-10-CM | POA: Diagnosis not present

## 2017-10-23 DIAGNOSIS — N200 Calculus of kidney: Secondary | ICD-10-CM | POA: Diagnosis not present

## 2017-10-28 ENCOUNTER — Ambulatory Visit (HOSPITAL_COMMUNITY)
Admission: RE | Admit: 2017-10-28 | Discharge: 2017-10-28 | Disposition: A | Discharge status: Home / Self Care | Payer: Medicare HMO | Source: Ambulatory Visit | Attending: Internal Medicine | Admitting: Internal Medicine

## 2017-10-28 ENCOUNTER — Telehealth (HOSPITAL_COMMUNITY): Payer: Self-pay | Admitting: Internal Medicine

## 2017-10-28 VITALS — BP 154/70 | HR 98 | Wt 271.4 lb

## 2017-10-28 DIAGNOSIS — I272 Pulmonary hypertension, unspecified: Secondary | ICD-10-CM | POA: Diagnosis not present

## 2017-10-28 DIAGNOSIS — Z823 Family history of stroke: Secondary | ICD-10-CM | POA: Insufficient documentation

## 2017-10-28 DIAGNOSIS — R079 Chest pain, unspecified: Secondary | ICD-10-CM | POA: Diagnosis not present

## 2017-10-28 DIAGNOSIS — Z79899 Other long term (current) drug therapy: Secondary | ICD-10-CM | POA: Insufficient documentation

## 2017-10-28 DIAGNOSIS — Z87442 Personal history of urinary calculi: Secondary | ICD-10-CM | POA: Insufficient documentation

## 2017-10-28 DIAGNOSIS — Z886 Allergy status to analgesic agent status: Secondary | ICD-10-CM | POA: Insufficient documentation

## 2017-10-28 DIAGNOSIS — Z91011 Allergy to milk products: Secondary | ICD-10-CM | POA: Insufficient documentation

## 2017-10-28 DIAGNOSIS — I4891 Unspecified atrial fibrillation: Secondary | ICD-10-CM | POA: Diagnosis not present

## 2017-10-28 DIAGNOSIS — J449 Chronic obstructive pulmonary disease, unspecified: Secondary | ICD-10-CM | POA: Insufficient documentation

## 2017-10-28 DIAGNOSIS — I110 Hypertensive heart disease with heart failure: Secondary | ICD-10-CM | POA: Diagnosis not present

## 2017-10-28 DIAGNOSIS — Z9884 Bariatric surgery status: Secondary | ICD-10-CM | POA: Insufficient documentation

## 2017-10-28 DIAGNOSIS — Z888 Allergy status to other drugs, medicaments and biological substances status: Secondary | ICD-10-CM | POA: Insufficient documentation

## 2017-10-28 DIAGNOSIS — N179 Acute kidney failure, unspecified: Secondary | ICD-10-CM | POA: Diagnosis not present

## 2017-10-28 DIAGNOSIS — I252 Old myocardial infarction: Secondary | ICD-10-CM | POA: Insufficient documentation

## 2017-10-28 DIAGNOSIS — G4733 Obstructive sleep apnea (adult) (pediatric): Secondary | ICD-10-CM | POA: Diagnosis not present

## 2017-10-28 DIAGNOSIS — Z91040 Latex allergy status: Secondary | ICD-10-CM | POA: Insufficient documentation

## 2017-10-28 DIAGNOSIS — F419 Anxiety disorder, unspecified: Secondary | ICD-10-CM | POA: Insufficient documentation

## 2017-10-28 DIAGNOSIS — I2510 Atherosclerotic heart disease of native coronary artery without angina pectoris: Secondary | ICD-10-CM | POA: Insufficient documentation

## 2017-10-28 DIAGNOSIS — K219 Gastro-esophageal reflux disease without esophagitis: Secondary | ICD-10-CM | POA: Diagnosis not present

## 2017-10-28 DIAGNOSIS — E119 Type 2 diabetes mellitus without complications: Secondary | ICD-10-CM | POA: Insufficient documentation

## 2017-10-28 DIAGNOSIS — Z803 Family history of malignant neoplasm of breast: Secondary | ICD-10-CM | POA: Insufficient documentation

## 2017-10-28 DIAGNOSIS — K648 Other hemorrhoids: Secondary | ICD-10-CM | POA: Insufficient documentation

## 2017-10-28 DIAGNOSIS — Z833 Family history of diabetes mellitus: Secondary | ICD-10-CM | POA: Insufficient documentation

## 2017-10-28 DIAGNOSIS — Z7984 Long term (current) use of oral hypoglycemic drugs: Secondary | ICD-10-CM | POA: Diagnosis not present

## 2017-10-28 DIAGNOSIS — Z882 Allergy status to sulfonamides status: Secondary | ICD-10-CM | POA: Diagnosis not present

## 2017-10-28 DIAGNOSIS — Z881 Allergy status to other antibiotic agents status: Secondary | ICD-10-CM | POA: Insufficient documentation

## 2017-10-28 DIAGNOSIS — R0789 Other chest pain: Secondary | ICD-10-CM | POA: Insufficient documentation

## 2017-10-28 DIAGNOSIS — Z79891 Long term (current) use of opiate analgesic: Secondary | ICD-10-CM | POA: Diagnosis not present

## 2017-10-28 DIAGNOSIS — I5032 Chronic diastolic (congestive) heart failure: Secondary | ICD-10-CM | POA: Diagnosis not present

## 2017-10-28 DIAGNOSIS — E781 Pure hyperglyceridemia: Secondary | ICD-10-CM | POA: Insufficient documentation

## 2017-10-28 DIAGNOSIS — I509 Heart failure, unspecified: Secondary | ICD-10-CM | POA: Diagnosis not present

## 2017-10-28 DIAGNOSIS — G8929 Other chronic pain: Secondary | ICD-10-CM | POA: Insufficient documentation

## 2017-10-28 DIAGNOSIS — K589 Irritable bowel syndrome without diarrhea: Secondary | ICD-10-CM | POA: Diagnosis not present

## 2017-10-28 DIAGNOSIS — Z87891 Personal history of nicotine dependence: Secondary | ICD-10-CM | POA: Insufficient documentation

## 2017-10-28 DIAGNOSIS — Z8249 Family history of ischemic heart disease and other diseases of the circulatory system: Secondary | ICD-10-CM | POA: Insufficient documentation

## 2017-10-28 DIAGNOSIS — Z91030 Bee allergy status: Secondary | ICD-10-CM | POA: Insufficient documentation

## 2017-10-28 LAB — BASIC METABOLIC PANEL
Anion gap: 8 (ref 5–15)
BUN: 17 mg/dL (ref 6–20)
CO2: 28 mmol/L (ref 22–32)
Calcium: 9.9 mg/dL (ref 8.9–10.3)
Chloride: 103 mmol/L (ref 101–111)
Creatinine, Ser: 1.72 mg/dL — ABNORMAL HIGH (ref 0.44–1.00)
GFR calc Af Amer: 36 mL/min — ABNORMAL LOW (ref >60)
GFR calc non Af Amer: 31 mL/min — ABNORMAL LOW (ref >60)
Glucose, Bld: 205 mg/dL — ABNORMAL HIGH (ref 65–99)
Potassium: 4.5 mmol/L (ref 3.5–5.1)
Sodium: 139 mmol/L (ref 135–145)

## 2017-10-28 MED ORDER — LOSARTAN POTASSIUM 50 MG PO TABS: 50.0000 mg | ORAL_TABLET | Freq: Every day | ORAL | 3 refills | Status: DC

## 2017-10-28 NOTE — Patient Instructions (Signed)
Start Losartan 50 mg daily  Your physician has requested that you have an echocardiogram. Echocardiography is a painless test that uses sound waves to create images of your heart. It provides your doctor with information about the size and shape of your heart and how well your heart's chambers and valves are working. This procedure takes approximately one hour. There are no restrictions for this procedure.  Your physician has requested that you have a lexiscan myoview. For further information please visit [email or web address]. Please follow instruction sheet, as given.  Your physician recommends that you schedule a follow-up appointment in: 3 months at [facility] in [place]

## 2017-10-28 NOTE — Progress Notes (Signed)
ADVANCED HF CLINIC CONSULT NOTE   Patient ID: Sara Tran, female   DOB: July 12, 1956, 61 y.o.   MRN: [id number] Referring Physician: Dr. [name] Primary Care: Dr. Hilma Favors Primary Cardiologist: Dr. Haroldine Laws  Nephrologist: Dr. Hinda Lenis  HPI:  Sara Tran is a 61 y.o. African American female with history of mild pulmonary hypertension, diabetes, HF, COPD, sleep apnea with CPAP, elevated triglycerides, renal insufficiency and morbid obesity s/p lap band in 2011. We last saw her in 2013.   She is now being referred back by Dr. [name] for further evaluation of recurrent CP. Dr. [name] for further work up of her dyspnea and pulmonary hypertension.   VQ scan 2008: normal  RHC 2008:  RA pressures 13/10 with a mean of 7 mmHg.  RV pressure 00/71 with end diastolic pressure of 15 mmHg.  PA pressure 49/23 with a mean of 35 mmHg.  Pulmonary capillary wedge 17/50 with a mean of 14 mmHg. The PA saturation was 68%.  RA saturation was 71% and aortic saturation was 90%.  Cardiac output was 6.0 with a cardiac index of 2.80 by Fick.  Normal Coronaries per [facility] 2008.  Dr. [name] performed PFTs on 03/13/12 which were normal. DLCO is minimally reduced, but is corrected to some extent when considerations of volume are made.  FVC 1.91 (81%), FEV1 (88%), FEV1/FVC 87 (107%), FEF 25-75% 2.09 (103%), DLCO 75%  RHC 05/2012 RA = 9  RV = 42/6/10  PA = 40/17 (24)  PCW = 12  Fick cardiac output/index = 6.0/2.8  PVR = 2.0 [place] units  FA sat = 99%  PA sat = 73%, 76%  SVC sat = 81%  Ao Pressure: 151/71 (103)  LV Pressure: 150/8/14  There was no signficant gradient across the aortic valve on pullback.  LHC 05/2012: normal cors  Echo 05/2012: LVEF 55-60%.  Trivial TR. Normal RV.    Echo 12/2014: LVEF 55-60%, Grade 1 DD, Trivial MR, Trivial TR, Mild RVH, normal RV function.  She presents today for consultation regarding recurrent CP. L last saw her in 2013.  Feeling OK overall. Says for past few weeks she has chest  pain throughout the day most days.  She feels like it is a gripping/squeezing sensation and rates it about a 5/10.  It gets worse in the cold weather or drinking something ice cold. She has never had an EGD. She said she tried Protonix in past and it didn't work and feels that this pain is from her heart.  She had hemoptysis in the 1990s and an EGD was recommended but she declined. It is occasionally accompanied by SOB, but more so she feels like she can't take a deep breath when the pain is at its worst. 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  Losartan stopped in August with AKI.   Review of systems complete and found to be negative unless listed in HPI.    Labs 08/11/17 Cr 1.84 K 4.2  Past Medical History:  Diagnosis Date  . Adnexal cyst    Right simple cyst seen on [place]  . Anginal pain ([place])   . Anxiety   . Arthritis   . Asthma   . Atrial fibrillation (HCC)    hx of   . CHF (congestive heart failure) ([place])   . Chronic back pain   . Chronic hip pain   . COPD (chronic obstructive pulmonary disease) ([place])   . Coronary artery  disease    stents placed   . Dyspnea   . GERD (gastroesophageal reflux disease)   . Headache    due to pinched nerve damaage   . Heart murmur    hx of slight murmur   . Herpes simplex virus (HSV) infection   . History of bronchitis   . History of cardiac catheterization    Normal coronaries 2013  . History of kidney stones   . Hot flashes   . Hypertension   . Hypertriglyceridemia   . IBS (irritable bowel syndrome)   . Internal hemorrhoids   . Morbid obesity ([place])    s/p lap band surgery  . Myocardial infarction ([place])    times 3  . Obesity   . Pulmonary HTN ([place])    [place] 2008:  RA pressures 13/10 with a mean of 7 mmHg.  RV pressure 16/10 with end diastolic pressure of 15 mmHg.  PA pressure 49/23 with a mean of 35 mmHg.  Pulmonary capillary wedge 17/50 with a mean of 14 mmHg. The  PA saturation was 68%.  RA saturation was 71% and aortic saturation was 90%.  Cardiac output was 6.0 with a cardiac index of 2.80 by Fick.  Normal coronaries by cath 2008.  Marland Kitchen Renal insufficiency   . Sciatica of right side   . Sleep apnea    CPAP use does not know settings   . Type 2 diabetes mellitus ([place])    type II   . Uterine fibroid     Current Outpatient Prescriptions  Medication Sig Dispense Refill  . albuterol (PROVENTIL HFA;VENTOLIN HFA) 108 (90 BASE) MCG/ACT inhaler Inhale 2 puffs into the lungs every 6 (six) hours as needed. FOR SHORTNESS OF BREATH/WHEEZING    . albuterol (PROVENTIL) (2.5 MG/3ML) 0.083% nebulizer solution Take 2.5 mg by nebulization every 4 (four) hours as needed for wheezing or shortness of breath.     . ALPRAZolam (XANAX) 0.5 MG tablet Take 0.5 mg by mouth 3 (three) times daily as needed. For anxiety/sleep    . amLODipine (NORVASC) 10 MG tablet Take 1 tablet by mouth every evening.     . budesonide (PULMICORT) 180 MCG/ACT inhaler Inhale 2 puffs into the lungs 2 (two) times daily.    . carisoprodol (SOMA) 350 MG tablet TAKE 1 TABLET FOUR TIMES DAILY AS NEEDED  FOR  MUSCLE  SPASM 30 tablet 0  . Cholecalciferol (VITAMIN D3 PO) Take 1 capsule by mouth daily.    . fenofibrate 160 MG tablet Take 160 mg by mouth daily.  0  . fluticasone (FLONASE) 50 MCG/ACT nasal spray Place 1-2 sprays into the nose daily.     Marland Kitchen gabapentin (NEURONTIN) 300 MG capsule Take 300 mg by mouth 3 (three) times daily.    Marland Kitchen HYDROmorphone (DILAUDID) 2 MG tablet Take 1 tablet (2 mg total) by mouth every 4 (four) hours as needed for severe pain. Post-Lithotripsy 15 tablet 0  . ipratropium (ATROVENT) 0.02 % nebulizer solution Take 0.5 mg by nebulization every 6 (six) hours as needed.     . isosorbide mononitrate (IMDUR) 30 MG 24 hr tablet Take 30 mg by mouth every morning.     . loratadine (CLARITIN) 10 MG tablet Take 1 tablet by mouth daily.    . magnesium oxide (MAG-OX) 400 MG tablet Take 400 mg  by mouth daily.    . metFORMIN (GLUCOPHAGE-XR) 500 MG 24 hr tablet Take 1 tablet by mouth daily.    . Multiple Vitamins-Minerals (ONE-A-DAY 50 PLUS PO) Take  1 tablet by mouth daily.    . nitroGLYCERIN (NITROSTAT) 0.4 MG SL tablet Place 0.4 mg under the tongue every 5 (five) minutes as needed. Reported on 06/14/2016    . omega-3 acid ethyl esters (LOVAZA) 1 g capsule Take by mouth 2 (two) times daily.    . ondansetron (ZOFRAN) 4 MG tablet Take 1 tablet (4 mg total) by mouth every 8 (eight) hours as needed for nausea or vomiting. 20 tablet 0  . oxybutynin (DITROPAN) 5 MG tablet Take 1 tablet (5 mg total) by mouth 3 (three) times daily as needed for bladder spasms. 30 tablet 0  . potassium chloride SA (K-DUR,KLOR-CON) 20 MEQ tablet Take 20 mEq by mouth every morning.     Marland Kitchen tiZANidine (ZANAFLEX) 4 MG tablet Take 4 mg by mouth every 8 (eight) hours as needed for muscle spasms.     No current facility-administered medications for this encounter.     Allergies  Allergen Reactions  . Bee Venom Anaphylaxis    As a child  . Ancef [Cefazolin] Other (See Comments)    Patient was told she was allergic but doesn't remember what happens  . Aspirin Other (See Comments)    Rectal bleeding  . Bactrim [Sulfamethoxazole-Trimethoprim] Other (See Comments)    Cramping,bloating,vomiting,fever,headaches  . Ceftin [Cefuroxime Axetil] Swelling    Swollen tongue, "made me crazy"  . Ciprofloxacin Cough    Coughed up blood  . Macrodantin [Nitrofurantoin] Other (See Comments)    Vomited blood and had dark stool.  . Milk-Related Compounds Other (See Comments)    IBS  . Other     Snake venom - not sure of reaction -had problems with feet   . Latex Itching and Rash   Social History   Social History  . Marital status: Single    Spouse name: N/A  . Number of children: 0  . Years of education: N/A   Occupational History  . Volunteers   . 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 Smoking status: Former Smoker    Types: Cigarettes    Quit date: 08/08/1996  . Smokeless tobacco: Never Used  . Alcohol use No  . Drug use: No  . Sexual activity: No   Other Topics Concern  . Not on file   Social History Narrative  . No narrative on file   Family History  Problem Relation Age of Onset  . Diabetes Mother   . Heart failure Mother   . Breast cancer Sister   . CAD Father   . Hypertension Father   . Cancer Paternal Grandfather   . Hypertension Paternal Grandmother   . Stroke Paternal Grandmother   . Other Maternal Grandmother        tonsilitis  . Diabetes Maternal Grandfather   . Hypertension Maternal Grandfather   . Breast cancer Paternal Aunt      PHYSICAL EXAM: Vitals:   10/28/17 1035  BP: (!) 154/70  Pulse: 98  SpO2: 95%  Weight: 271 lb 6 oz (123.1 kg)   Wt Readings from Last 3 Encounters:  10/28/17 271 lb 6 oz (123.1 kg)  08/22/17 266 lb 8 oz (120.9 kg)  08/08/17 275 lb 6 oz (124.9 kg)    General: Obese. NAD  HEENT: Normal Neck: Supple. JVP difficult due to very thick neck. Carotids 2+ bilat; no bruits. No thyromegaly or nodule noted. Cor: PMI nondisplaced. RRR, No M/G/R noted Lungs: CTAB, normal  effort. Abdomen: Soft, non-tender, non-distended, no HSM. No bruits or masses. +BS  Extremities: No cyanosis, clubbing, or rash. R and LLE no edema.  Neuro: Alert & orientedx3, cranial nerves grossly intact. moves all 4 extremities w/o difficulty. Affect pleasant   EKG NSR 92 bpm, LAD, minimal LVH criteria No significant ST-T abnormalities Personally reviewed   ASSESSMENT & PLAN:  1. Chest pain: No coronary disease on cath in 6/13.   - Long history of atypical chest pain that occurs with wheezing. Worse in cold weather and with cold drinks. She has history of ulcers and high stress.  - Pre-test probability at least moderate with age, weight, DM2, and HTN. Will order [employer].  - Could consider EGD for further GI evaluation  - Suspect related to her asthma. - Continue imdur 60 mg daily.  2. Exertional dyspnea:  - Stable. Suspect mostly related to obesity and deconditioning - Difficult to exercise with arthritis in back and hip.  3. Pulmonary hypertension: Patient had mild pulmonary hypertension on RHC in 6/13.  - Echo in 12/2014 with mild RVH and Grade 1 DD, otherwise unremarkable.  - Will repeat Echo.  4. OSA - Encouraged nightly CPAP.  5. Hypertension - Continue amlodipine 10 mg daily - Previously taken off of Losartan for AKI. Will restart at 50 mg daily. BMET today.  6. DM2 - Per PCP.   Labs today. Schedule for Echo and Lexiscan Myoview.  Follow up with [facility]. Return to CHF clinic prn.   Shirley Friar, PA-C  10/28/2017  Patient seen and examined with the above-signed Advanced Practice Provider and/or Housestaff. I personally reviewed laboratory data, imaging studies and relevant notes. I independently examined the patient and formulated the important aspects of the plan. I have edited the note to reflect any of my changes or salient points. I have personally discussed the plan with the patient and/or family.  Although she does have significant RFs for CAD, CP is mostly atypical and previous cath 5 years ago showed normal coronaries. She is unwilling to reattempt a trial of PPI. We discussed further evaluation with cath vs Myoview. I have recommended Myoview and echo. She is agreeable to this. Will also restart Losartan for HTN. Needs ongoing weight loss efforts.   Will refer to [facility] for ongoing cardiology management as needed.   Glori Bickers, MD  2:13 PM

## 2017-10-29 NOTE — Telephone Encounter (Signed)
User: Sara Tran A Date/time: 10/28/17 2:18 PM  Comment: Called pt and lmsg for her to CB to get scheduled for a myoview.   Context:  Outcome: Left Message  Phone number: [phone] Phone Type: Home Phone  Comm. type: Telephone Call type: Outgoing  Contact: Linzie Collin A Relation to patient: Self

## 2017-11-01 ENCOUNTER — Emergency Department (HOSPITAL_COMMUNITY): Payer: Medicare HMO

## 2017-11-01 ENCOUNTER — Emergency Department (HOSPITAL_COMMUNITY)
Admission: EM | Admit: 2017-11-01 | Discharge: 2017-11-01 | Disposition: A | Discharge status: Home / Self Care | Payer: Medicare HMO | Attending: Emergency Medicine | Admitting: Emergency Medicine

## 2017-11-01 ENCOUNTER — Encounter (HOSPITAL_COMMUNITY): Payer: Self-pay | Admitting: Emergency Medicine

## 2017-11-01 DIAGNOSIS — I5032 Chronic diastolic (congestive) heart failure: Secondary | ICD-10-CM | POA: Diagnosis not present

## 2017-11-01 DIAGNOSIS — J449 Chronic obstructive pulmonary disease, unspecified: Secondary | ICD-10-CM | POA: Diagnosis not present

## 2017-11-01 DIAGNOSIS — Z7984 Long term (current) use of oral hypoglycemic drugs: Secondary | ICD-10-CM | POA: Diagnosis not present

## 2017-11-01 DIAGNOSIS — J45909 Unspecified asthma, uncomplicated: Secondary | ICD-10-CM | POA: Diagnosis not present

## 2017-11-01 DIAGNOSIS — I2510 Atherosclerotic heart disease of native coronary artery without angina pectoris: Secondary | ICD-10-CM | POA: Insufficient documentation

## 2017-11-01 DIAGNOSIS — R079 Chest pain, unspecified: Secondary | ICD-10-CM | POA: Diagnosis not present

## 2017-11-01 DIAGNOSIS — Z955 Presence of coronary angioplasty implant and graft: Secondary | ICD-10-CM | POA: Diagnosis not present

## 2017-11-01 DIAGNOSIS — I110 Hypertensive heart disease with heart failure: Secondary | ICD-10-CM | POA: Insufficient documentation

## 2017-11-01 DIAGNOSIS — E119 Type 2 diabetes mellitus without complications: Secondary | ICD-10-CM | POA: Insufficient documentation

## 2017-11-01 DIAGNOSIS — Z79899 Other long term (current) drug therapy: Secondary | ICD-10-CM | POA: Diagnosis not present

## 2017-11-01 DIAGNOSIS — R0789 Other chest pain: Secondary | ICD-10-CM | POA: Diagnosis not present

## 2017-11-01 DIAGNOSIS — R1084 Generalized abdominal pain: Secondary | ICD-10-CM | POA: Diagnosis not present

## 2017-11-01 DIAGNOSIS — R109 Unspecified abdominal pain: Secondary | ICD-10-CM | POA: Insufficient documentation

## 2017-11-01 DIAGNOSIS — Z87891 Personal history of nicotine dependence: Secondary | ICD-10-CM | POA: Insufficient documentation

## 2017-11-01 LAB — BASIC METABOLIC PANEL
Anion gap: 11 (ref 5–15)
BUN: 21 mg/dL — ABNORMAL HIGH (ref 6–20)
CO2: 25 mmol/L (ref 22–32)
Calcium: 9.8 mg/dL (ref 8.9–10.3)
Chloride: 102 mmol/L (ref 101–111)
Creatinine, Ser: 1.67 mg/dL — ABNORMAL HIGH (ref 0.44–1.00)
GFR calc Af Amer: 37 mL/min — ABNORMAL LOW (ref >60)
GFR calc non Af Amer: 32 mL/min — ABNORMAL LOW (ref >60)
Glucose, Bld: 229 mg/dL — ABNORMAL HIGH (ref 65–99)
Potassium: 4.6 mmol/L (ref 3.5–5.1)
Sodium: 138 mmol/L (ref 135–145)

## 2017-11-01 LAB — CBC
HCT: 34.6 % — ABNORMAL LOW (ref 36.0–46.0)
Hemoglobin: 11.3 g/dL — ABNORMAL LOW (ref 12.0–15.0)
MCH: 31 pg (ref 26.0–34.0)
MCHC: 32.7 g/dL (ref 30.0–36.0)
MCV: 94.8 fL (ref 78.0–100.0)
Platelets: 278 10*3/uL (ref 150–400)
RBC: 3.65 MIL/uL — ABNORMAL LOW (ref 3.87–5.11)
RDW: 13.6 % (ref 11.5–15.5)
WBC: 7.5 10*3/uL (ref 4.0–10.5)

## 2017-11-01 LAB — URINALYSIS, ROUTINE W REFLEX MICROSCOPIC
Bilirubin Urine: NEGATIVE
Glucose, UA: NEGATIVE mg/dL
Hgb urine dipstick: NEGATIVE
Ketones, ur: NEGATIVE mg/dL
Leukocytes, UA: NEGATIVE
Nitrite: NEGATIVE
Protein, ur: NEGATIVE mg/dL
Specific Gravity, Urine: 1.013 (ref 1.005–1.030)
pH: 6 (ref 5.0–8.0)

## 2017-11-01 LAB — TROPONIN I: Troponin I: <0.03 ng/mL (ref <0.03)

## 2017-11-01 MED ORDER — TRAMADOL HCL 50 MG PO TABS
50.0000 mg | ORAL_TABLET | Freq: Four times a day (QID) | ORAL | 0 refills | Status: DC | PRN
Start: 2017-11-01 — End: 2017-11-20

## 2017-11-01 MED ORDER — HYDROCODONE-ACETAMINOPHEN 5-325 MG PO TABS
1.0000 | ORAL_TABLET | ORAL | Status: AC
  Administered 2017-11-01: 1 via ORAL
  Filled 2017-11-01: qty 1

## 2017-11-01 NOTE — ED Provider Notes (Signed)
[facility] EMERGENCY DEPARTMENT Provider Note   CSN: [id number] Arrival date & time: 11/01/17  1336     History   Chief Complaint Chief Complaint  Patient presents with  . Chest Pain    HPI DAMETRIA TUZZOLINO is a 61 y.o. female.  HPI Pt states she has been having trouble with abdominal pain, shortness of breath, chest pain, pelvic pain all starting in May.  She gets lightheaded and dizzy.  She feels like her brain freezes up sometimes.  Pt has been diagnosed with kidney stones previously.  She also has been diagnosed with a klebsiella uti and is taking bactrim.  Past Medical History:  Diagnosis Date  . Adnexal cyst    Right simple cyst seen on [place]  . Anginal pain ([place])   . Anxiety   . Arthritis   . Asthma   . Atrial fibrillation (HCC)    hx of   . CHF (congestive heart failure) ([place])   . Chronic back pain   . Chronic hip pain   . COPD (chronic obstructive pulmonary disease) ([place])   . Coronary artery disease    stents placed   . Dyspnea   . GERD (gastroesophageal reflux disease)   . Headache    due to pinched nerve damaage   . Heart murmur    hx of slight murmur   . Herpes simplex virus (HSV) infection   . History of bronchitis   . History of cardiac catheterization    Normal coronaries 2013  . History of kidney stones   . Hot flashes   . Hypertension   . Hypertriglyceridemia   . IBS (irritable bowel syndrome)   . Internal hemorrhoids   . Morbid obesity ([place])    s/p lap band surgery  . Myocardial infarction ([place])    times 3  . Obesity   . Pulmonary HTN ([place])    [place] 2008:  RA pressures 13/10 with a mean of 7 mmHg.  RV pressure 46/50 with end diastolic pressure of 15 mmHg.  PA pressure 49/23 with a mean of 35 mmHg.  Pulmonary capillary wedge 17/50 with a mean of 14 mmHg. The PA saturation was 68%.  RA saturation was 71% and aortic saturation was 90%.  Cardiac output was 6.0 with a cardiac index of 2.80 by Fick.  Normal coronaries by cath 2008.  Marland Kitchen Renal  insufficiency   . Sciatica of right side   . Sleep apnea    CPAP use does not know settings   . Type 2 diabetes mellitus ([place])    type II   . Uterine fibroid     Patient Active Problem List   Diagnosis Date Noted  . [place] kidney stones 08/08/2017  . GI bleed 05/29/2017  . Sepsis due to urinary tract infection ([place]) 05/29/2017  . UTI (urinary tract infection) 05/29/2017  . Cervicalgia 11/21/2016  . Chronic pain syndrome 10/11/2015  . Lumbar radiculopathy 07/25/2015  . Fibroid 10/13/2013  . Adnexal cyst 10/13/2013  . PMB (postmenopausal bleeding) 10/06/2013  . Unspecified symptom associated with female genital organs 10/06/2013  . Hot flashes 10/06/2013  . Internal hemorrhoids with other complication 35/46/5681  . 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 Hip pain 04/09/2013  . Hemorrhoids, internal 03/12/2013  . Back pain 03/11/2013  . Encounter for therapeutic drug monitoring 03/11/2013  . Trochanteric bursitis 03/11/2013  . Lumbar facet arthropathy 03/11/2013  . Unstable angina ([place]) 06/02/2012  . Acute on chronic renal insufficiency 06/02/2012  . Chest pain  04/22/2012  . IBS (irritable bowel syndrome)-DIARRHEA 01/23/2012  . Morbid obesity ([place]) 08/09/2011  . Diabetes mellitus ([place]) 08/09/2011  . DIARRHEA 10/11/2010  . Chronic diastolic heart failure ([place]) 12/16/2007  . Pulmonary hypertension ([place]) 12/12/2007  . OSTEOARTHRITIS 12/11/2007  . SLEEP APNEA 12/11/2007    Past Surgical History:  Procedure Laterality Date  . APPENDECTOMY    . CARDIAC CATHETERIZATION  07/2007, 05/2012   Normal coronary arteries  . COLONOSCOPY  10/18/10   SLF:2-mm sessile sigmoid polyp/diverticula  . CYSTOSCOPY/URETEROSCOPY/HOLMIUM LASER/STENT PLACEMENT Right 08/01/2017   Procedure: CYSTOSCOPY/ RIGHT URETEROSCOPY/ RIGHT RETROGRADE/ HOLMIUM LASER  AND RIGHT STENT PLACEMENT FIRST STAGE;  Surgeon: Irine Seal, MD;  Location: [facility];  Service: Urology;  Laterality: Right;  .  CYSTOSCOPY/URETEROSCOPY/HOLMIUM LASER/STENT PLACEMENT Right 08/08/2017   Procedure: RIGHT URETEROSCOPY WITH HOLMIUM LASER RIGHT STENT PLACEMENT;  Surgeon: Irine Seal, MD;  Location: [facility];  Service: Urology;  Laterality: Right;  . EXTRACORPOREAL SHOCK WAVE LITHOTRIPSY Right 08/22/2017   Procedure: RIGHT EXTRACORPOREAL SHOCK WAVE LITHOTRIPSY (ESWL);  Surgeon: Alexis Frock, MD;  Location: [facility];  Service: Urology;  Laterality: Right;  . HEMORRHOID BANDING  07/2012   Dr. Oneida Alar  . LAPAROSCOPIC CHOLECYSTECTOMY  1985  . LAPAROSCOPIC GASTRIC BANDING  12/12/2010  . LEFT AND RIGHT HEART CATHETERIZATION WITH CORONARY ANGIOGRAM N/A 06/03/2012   Procedure: LEFT AND RIGHT HEART CATHETERIZATION WITH CORONARY ANGIOGRAM;  Surgeon: Jolaine Artist, MD;  Location[facility] CATH LAB;  Service: Cardiovascular;  Laterality: N/A;  . PILONIDAL CYST EXCISION  1974  . 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 TUMOR EXCISION  10/2011   Face    OB History    Gravida Para Term Preterm AB Living   0 0 0 0 0 0   SAB TAB Ectopic Multiple Live Births   0 0 0 0         Home Medications    Prior to Admission medications   Medication Sig Start Date End Date Taking? Authorizing Provider  albuterol (PROVENTIL HFA;VENTOLIN HFA) 108 (90 BASE) MCG/ACT inhaler Inhale 2 puffs into the lungs every 6 (six) hours as needed. FOR SHORTNESS OF BREATH/WHEEZING    [provider]  albuterol (PROVENTIL) (2.5 MG/3ML) 0.083% nebulizer solution Take 2.5 mg by nebulization every 4 (four) hours as needed for wheezing or shortness of breath.  09/19/15   [provider]  ALPRAZolam Duanne Moron) 0.5 MG tablet Take 0.5 mg by mouth 3 (three) times daily as needed. For anxiety/sleep 11/24/14   [provider]  amLODipine (NORVASC) 10 MG tablet Take 1 tablet by mouth every evening.  04/10/13   [provider]  budesonide (PULMICORT) 180 MCG/ACT inhaler Inhale 2 puffs into the lungs 2 (two) times daily.    [provider]  carisoprodol (SOMA) 350 MG tablet TAKE 1 TABLET FOUR TIMES DAILY AS NEEDED  FOR  MUSCLE  SPASM 10/01/17   Meredith Staggers, MD  Cholecalciferol (VITAMIN D3 PO) Take 1 capsule by mouth daily.    [provider]  fenofibrate 160 MG tablet Take 160 mg by mouth daily. 07/21/15   [provider]  fluticasone (FLONASE) 50 MCG/ACT nasal spray Place 1-2 sprays into the nose daily.     [provider]  gabapentin (NEURONTIN) 300 MG capsule Take 300 mg by mouth 3 (three) times daily.    [provider]  HYDROmorphone (DILAUDID) 2 MG tablet Take 1 tablet (2 mg total) by mouth every 4 (four) hours as needed for severe  pain. Post-Lithotripsy 08/22/17 08/22/18  Alexis Frock, MD  ipratropium (ATROVENT) 0.02 % nebulizer solution Take 0.5 mg by nebulization every 6 (six) hours as needed.  09/19/15   [provider]  isosorbide mononitrate (IMDUR) 30 MG 24 hr tablet Take 30 mg by mouth every morning.  09/05/11   [provider]  loratadine (CLARITIN) 10 MG tablet Take 1 tablet by mouth daily. 05/27/17   [provider]  losartan (COZAAR) 50 MG tablet Take 1 tablet (50 mg total) by mouth daily. 10/28/17 01/26/18  Bensimhon, Shaune Pascal, MD  magnesium oxide (MAG-OX) 400 MG tablet Take 400 mg by mouth daily.    [provider]  metFORMIN (GLUCOPHAGE-XR) 500 MG 24 hr tablet Take 1 tablet by mouth daily. 05/20/17   [provider]  Multiple Vitamins-Minerals (ONE-A-DAY 50 PLUS PO) Take 1 tablet by mouth daily.    [provider]  nitroGLYCERIN (NITROSTAT) 0.4 MG SL tablet Place 0.4 mg under the tongue every 5 (five) minutes as needed. Reported on 06/14/2016    [provider]  omega-3 acid ethyl esters (LOVAZA) 1 g capsule Take by mouth 2 (two) times daily.    [provider]  ondansetron (ZOFRAN) 4 MG tablet Take 1 tablet (4 mg total) by mouth every 8 (eight) hours as needed for nausea or vomiting. 08/22/17    Alexis Frock, MD  oxybutynin (DITROPAN) 5 MG tablet Take 1 tablet (5 mg total) by mouth 3 (three) times daily as needed for bladder spasms. 08/01/17   Irine Seal, MD  potassium chloride SA (K-DUR,KLOR-CON) 20 MEQ tablet Take 20 mEq by mouth every morning.     [provider]  tiZANidine (ZANAFLEX) 4 MG tablet Take 4 mg by mouth every 8 (eight) hours as needed for muscle spasms.    [provider]  traMADol (ULTRAM) 50 MG tablet Take 1 tablet (50 mg total) by mouth every 6 (six) hours as needed. 11/01/17   Dorie Rank, MD    Family History Family History  Problem Relation Age of Onset  . Diabetes Mother   . Heart failure Mother   . Breast cancer Sister   . CAD Father   . Hypertension Father   . Cancer Paternal Grandfather   . Hypertension Paternal Grandmother   . Stroke Paternal Grandmother   . Other Maternal Grandmother        tonsilitis  . Diabetes Maternal Grandfather   . Hypertension Maternal Grandfather   . Breast cancer Paternal Aunt     Social History Social History  Substance Use Topics  . Smoking status: Former Smoker    Types: Cigarettes    Quit date: 08/08/1996  . Smokeless tobacco: Never Used  . Alcohol use No     Allergies   Bee venom; Ancef [cefazolin]; Aspirin; Bactrim [sulfamethoxazole-trimethoprim]; Ceftin [cefuroxime axetil]; Ciprofloxacin; Macrodantin [nitrofurantoin]; Milk-related compounds; Other; and Latex   Review of Systems Review of Systems  Constitutional: Negative for fever.  Respiratory: Positive for cough.   Cardiovascular: Positive for chest pain.  Gastrointestinal: Positive for abdominal pain, diarrhea (yesterday) and vomiting (yesterday]).  Genitourinary: Positive for dysuria and flank pain.  Skin: Negative for rash.  Neurological: Positive for light-headedness.  All other systems reviewed and are negative.    Physical Exam Updated Vital Signs BP (!) 143/75 (BP Location: Left Arm)   Pulse 90   Temp 98 F (36.7 C)  (Oral)   Resp 17   Ht 1.473 m (4\' 10" )   Wt 122.9 kg (271 lb)  LMP 06/06/2013   SpO2 98%   BMI 56.64 kg/m   Physical Exam  Constitutional: No distress.  Obese  HENT:  Head: Normocephalic and atraumatic.  Right Ear: External ear normal.  Left Ear: External ear normal.  Eyes: Conjunctivae are normal. Right eye exhibits no discharge. Left eye exhibits no discharge. No scleral icterus.  Neck: Neck supple. No tracheal deviation present.  Cardiovascular: Normal rate, regular rhythm and intact distal pulses.   Pulmonary/Chest: Effort normal and breath sounds normal. No stridor. No respiratory distress. She has no wheezes. She has no rales. She exhibits tenderness.  Abdominal: Soft. Bowel sounds are normal. She exhibits no distension. There is generalized tenderness. There is no rebound and no guarding.  Musculoskeletal: She exhibits no edema or tenderness.  Patient is also tender in several areas in her extremities, no erythema or abdomen exam findings otherwise  Neurological: She is alert. She has normal strength. No cranial nerve deficit (no facial droop, extraocular movements intact, no slurred speech) or sensory deficit. She exhibits normal muscle tone. She displays no seizure activity. Coordination normal.  Skin: Skin is warm and dry. No rash noted.  Psychiatric: She has a normal mood and affect.  Nursing note and vitals reviewed.    ED Treatments / Results  Labs (all labs ordered are listed, but only abnormal results are displayed) Labs Reviewed  BASIC METABOLIC PANEL - Abnormal; Notable for the following:       Result Value   Glucose, Bld 229 (*)    BUN 21 (*)    Creatinine, Ser 1.67 (*)    GFR calc non Af Amer 32 (*)    GFR calc Af Amer 37 (*)    All other components within normal limits  CBC - Abnormal; Notable for the following:    RBC 3.65 (*)    Hemoglobin 11.3 (*)    HCT 34.6 (*)    All other components within normal limits  URINALYSIS, ROUTINE W REFLEX  MICROSCOPIC  TROPONIN I    EKG  EKG Interpretation  Date/Time:  Friday November 01 2017 13:57:53 EDT Ventricular Rate:  93 PR Interval:  130 QRS Duration: 82 QT Interval:  380 QTC Calculation: 472 R Axis:   -28 Text Interpretation:  Normal sinus rhythm Moderate voltage criteria for LVH, may be normal variant Borderline ECG No significant change since last tracing Confirmed by Dorie Rank [phone]) on 11/01/2017 3:14:01 PM Also confirmed by Dorie Rank [phone])  on 11/01/2017 3:14:38 PM       Radiology Dg Chest 2 View  Result Date: 11/01/2017 CLINICAL DATA:  Chest pain beginning yesterday. Personal history of angioma. EXAM: CHEST  2 VIEW COMPARISON:  One-view chest x-ray 08/08/2017 FINDINGS: Heart mildly enlarged. Mild pulmonary vascular congestion is present. There is no frank edema or effusion. Mild left basilar atelectasis is noted. The visualized soft tissues and bony thorax are unremarkable. IMPRESSION: 1. Cardiomegaly and mild pulmonary vascular congestion without frank edema. 2. No focal airspace disease. Electronically Signed   By: San Morelle M.D.   On: 11/01/2017 14:29    Procedures Procedures (including critical care time)  Medications Ordered in ED Medications  HYDROcodone-acetaminophen (NORCO/VICODIN) 5-325 MG per tablet 1 tablet (not administered)     Initial Impression / Assessment and Plan / ED Course  I have reviewed the triage vital signs and the nursing notes.  Pertinent labs & imaging results that were available during my care of the patient were reviewed by me and considered in my medical decision  making (see chart for details).   Patient presented to the emergency room for evaluation of numerous complaints.  These did involve abdominal pain as well as chest pain.  Patient states his symptoms have been going on for a long period of time.  Patient has focal tenderness throughout several areas of her body.  Her laboratory tests fortunately are reassuring.   No signs to suggest cardiac injury.  EKG and chest x-ray are also reassuring.  Renal insufficiency is stable.  Urinalysis does not suggest urinary tract infection.  Considering the chronicity of her symptoms and reassuring laboratory test today I do not think there is any signs of acute emergency medical condition.  I doubt acute abdominal infection, obstruction, acute coronary syndrome or pulmonary embolism.  At this time there does not appear to be any evidence of an acute emergency medical condition and the patient appears stable for discharge with appropriate outpatient follow up.   Final Clinical Impressions(s) / ED Diagnoses   Final diagnoses:  Chest pain, unspecified type  Abdominal pain, unspecified abdominal location    New Prescriptions New Prescriptions   TRAMADOL (ULTRAM) 50 MG TABLET    Take 1 tablet (50 mg total) by mouth every 6 (six) hours as needed.     Dorie Rank, MD 11/01/17 1806

## 2017-11-01 NOTE — ED Notes (Signed)
Pt left with friend and was advised not to drive. Verbalized understanding

## 2017-11-01 NOTE — ED Triage Notes (Addendum)
Patient has multiple complaints. Per patient left side chest pain that radiates into left arm. Patient reports nausea, vomiting, shortness of breath, generalized weakness, and light headedness. Patient also c/o lower abd pain, and mid back pain. Patient states that she has kidney failure and stomach ulcer. Patient states "pin for a while but becoming more severe in past few days." Patient also states "I have been septic before and this is what I felt like." Patient currently being treated for UTI with Bactrim, which is on her allergy test. Per patient Dr Jeffie Pollock and PCP aware of allergy.

## 2017-11-01 NOTE — Discharge Instructions (Signed)
Finish your course of antibiotics that you are previously prescribed.  Take the medications as needed for pain.  Follow-up with your primary care doctor if your symptoms do not resolve over the next few days

## 2017-11-01 NOTE — ED Notes (Signed)
Pt states her chest pain is not really bothering her as long as she doesn't move. Is complaining of back pain

## 2017-11-14 ENCOUNTER — Telehealth (HOSPITAL_COMMUNITY): Payer: Self-pay | Admitting: *Deleted

## 2017-11-14 NOTE — Telephone Encounter (Signed)
Patient given detailed instructions per Myocardial Perfusion Study Information Sheet for the test on 11/18/17 at 12:30. Patient notified to arrive 15 minutes early and that it is imperative to arrive on time for appointment to keep from having the test rescheduled.  If you need to cancel or reschedule your appointment, please call the office within 24 hours of your appointment. . Patient verbalized understanding.Sara Tran

## 2017-11-14 NOTE — Telephone Encounter (Signed)
Left message on voicemail in reference to upcoming appointment scheduled for 11/18/17 Phone number given for a call back so details instructions can be given. Sara Tran

## 2017-11-18 ENCOUNTER — Ambulatory Visit (HOSPITAL_COMMUNITY): Payer: Medicare HMO | Attending: Cardiology

## 2017-11-18 DIAGNOSIS — I501 Left ventricular failure: Secondary | ICD-10-CM | POA: Diagnosis not present

## 2017-11-18 DIAGNOSIS — R9439 Abnormal result of other cardiovascular function study: Secondary | ICD-10-CM | POA: Insufficient documentation

## 2017-11-18 DIAGNOSIS — R079 Chest pain, unspecified: Secondary | ICD-10-CM | POA: Diagnosis not present

## 2017-11-18 MED ORDER — TECHNETIUM TC 99M TETROFOSMIN IV KIT
32.5000 | PACK | Freq: Once | INTRAVENOUS | Status: AC | PRN
  Administered 2017-11-18: 32.5 via INTRAVENOUS
  Filled 2017-11-18: qty 33

## 2017-11-18 MED ORDER — REGADENOSON 0.4 MG/5ML IV SOLN
0.4000 mg | Freq: Once | INTRAVENOUS | Status: AC
  Administered 2017-11-18: 0.4 mg via INTRAVENOUS

## 2017-11-19 ENCOUNTER — Ambulatory Visit (HOSPITAL_COMMUNITY): Payer: Medicare HMO | Attending: Cardiology

## 2017-11-19 MED ORDER — TECHNETIUM TC 99M TETROFOSMIN IV KIT
30.6000 | PACK | Freq: Once | INTRAVENOUS | Status: AC | PRN
  Administered 2017-11-19: 30.6 via INTRAVENOUS
  Filled 2017-11-19: qty 31

## 2017-11-20 ENCOUNTER — Encounter: Payer: Medicare HMO | Attending: Physical Medicine & Rehabilitation | Admitting: Physical Medicine & Rehabilitation

## 2017-11-20 ENCOUNTER — Encounter: Payer: Self-pay | Admitting: Physical Medicine & Rehabilitation

## 2017-11-20 ENCOUNTER — Ambulatory Visit (HOSPITAL_COMMUNITY): Payer: Medicare HMO

## 2017-11-20 ENCOUNTER — Other Ambulatory Visit (HOSPITAL_COMMUNITY): Payer: Medicare HMO

## 2017-11-20 VITALS — BP 171/77 | HR 85

## 2017-11-20 DIAGNOSIS — F419 Anxiety disorder, unspecified: Secondary | ICD-10-CM | POA: Diagnosis not present

## 2017-11-20 DIAGNOSIS — K219 Gastro-esophageal reflux disease without esophagitis: Secondary | ICD-10-CM | POA: Diagnosis not present

## 2017-11-20 DIAGNOSIS — K589 Irritable bowel syndrome without diarrhea: Secondary | ICD-10-CM | POA: Insufficient documentation

## 2017-11-20 DIAGNOSIS — M542 Cervicalgia: Secondary | ICD-10-CM | POA: Insufficient documentation

## 2017-11-20 DIAGNOSIS — M4180 Other forms of scoliosis, site unspecified: Secondary | ICD-10-CM

## 2017-11-20 DIAGNOSIS — M7631 Iliotibial band syndrome, right leg: Secondary | ICD-10-CM | POA: Diagnosis not present

## 2017-11-20 DIAGNOSIS — Z87891 Personal history of nicotine dependence: Secondary | ICD-10-CM | POA: Insufficient documentation

## 2017-11-20 DIAGNOSIS — G4730 Sleep apnea, unspecified: Secondary | ICD-10-CM | POA: Insufficient documentation

## 2017-11-20 DIAGNOSIS — M4186 Other forms of scoliosis, lumbar region: Secondary | ICD-10-CM | POA: Diagnosis not present

## 2017-11-20 DIAGNOSIS — I252 Old myocardial infarction: Secondary | ICD-10-CM | POA: Insufficient documentation

## 2017-11-20 DIAGNOSIS — Z9884 Bariatric surgery status: Secondary | ICD-10-CM | POA: Insufficient documentation

## 2017-11-20 DIAGNOSIS — I509 Heart failure, unspecified: Secondary | ICD-10-CM | POA: Insufficient documentation

## 2017-11-20 DIAGNOSIS — I110 Hypertensive heart disease with heart failure: Secondary | ICD-10-CM | POA: Diagnosis not present

## 2017-11-20 DIAGNOSIS — G8929 Other chronic pain: Secondary | ICD-10-CM | POA: Diagnosis not present

## 2017-11-20 DIAGNOSIS — K648 Other hemorrhoids: Secondary | ICD-10-CM | POA: Insufficient documentation

## 2017-11-20 DIAGNOSIS — Z87440 Personal history of urinary (tract) infections: Secondary | ICD-10-CM | POA: Insufficient documentation

## 2017-11-20 DIAGNOSIS — I4891 Unspecified atrial fibrillation: Secondary | ICD-10-CM | POA: Insufficient documentation

## 2017-11-20 DIAGNOSIS — M7062 Trochanteric bursitis, left hip: Secondary | ICD-10-CM | POA: Diagnosis not present

## 2017-11-20 DIAGNOSIS — M47816 Spondylosis without myelopathy or radiculopathy, lumbar region: Secondary | ICD-10-CM | POA: Insufficient documentation

## 2017-11-20 DIAGNOSIS — J449 Chronic obstructive pulmonary disease, unspecified: Secondary | ICD-10-CM | POA: Diagnosis not present

## 2017-11-20 DIAGNOSIS — Z87442 Personal history of urinary calculi: Secondary | ICD-10-CM | POA: Insufficient documentation

## 2017-11-20 DIAGNOSIS — M7061 Trochanteric bursitis, right hip: Secondary | ICD-10-CM | POA: Insufficient documentation

## 2017-11-20 DIAGNOSIS — E781 Pure hyperglyceridemia: Secondary | ICD-10-CM | POA: Insufficient documentation

## 2017-11-20 DIAGNOSIS — M7060 Trochanteric bursitis, unspecified hip: Secondary | ICD-10-CM | POA: Diagnosis not present

## 2017-11-20 DIAGNOSIS — E119 Type 2 diabetes mellitus without complications: Secondary | ICD-10-CM | POA: Diagnosis not present

## 2017-11-20 DIAGNOSIS — I272 Pulmonary hypertension, unspecified: Secondary | ICD-10-CM | POA: Insufficient documentation

## 2017-11-20 DIAGNOSIS — I2510 Atherosclerotic heart disease of native coronary artery without angina pectoris: Secondary | ICD-10-CM | POA: Diagnosis not present

## 2017-11-20 LAB — MYOCARDIAL PERFUSION IMAGING
LV dias vol: 93 mL (ref 46–106)
LV sys vol: 53 mL
Peak HR: 101 {beats}/min
RATE: 0.32
Rest HR: 84 {beats}/min
SDS: 4
SRS: 8
SSS: 12
TID: 1.09

## 2017-11-20 NOTE — Progress Notes (Signed)
Subjective:    Patient ID: Sara Tran, female    DOB: Jan 25, 1956, 61 y.o.   MRN: [id number]  HPI   Sara Tran is here in follow up of her chronic pain. She has had a busy few months with persistent chest pain/cardiac work up, kidney stones, etc. She just underwent a stress test yesterday.   She is complaining of ongoing low back pain as well as bilateral hip pain. She is also having pain radiating from her knees to her ankles bilaterally.  She tries to stay active somewhat by going to the gym and performing activities around the house but admittedly she is not doing as much as she once did.  She stretches inconsistently.  She states that she is doing a better job with her sugars but they still could be better.  She received oxycodone through her primary physician.  She uses Soma and tizanidine for muscle spasms.  Pain Inventory Average Pain 9 Pain Right Now 8 My pain is constant, burning, dull, tingling and aching  In the last 24 hours, has pain interfered with the following? General activity 8 Relation with others 8 Enjoyment of life 10 What TIME of day is your pain at its worst? morning Sleep (in general) Poor  Pain is worse with: walking, bending and standing Pain improves with: rest, medication and injections Relief from Meds: 8  Mobility walk without assistance walk with assistance use a cane use a walker ability to climb steps?  yes do you drive?  yes  Function disabled: date disabled 2008  Neuro/Psych weakness numbness tremor tingling trouble walking spasms dizziness confusion  Prior Studies Any changes since last visit?  no  Physicians involved in your care Any changes since last visit?  no   Family History  Problem Relation Age of Onset  . Diabetes Mother   . Heart failure Mother   . Breast cancer Sister   . CAD Father   . Hypertension Father   . Cancer Paternal Grandfather   . Hypertension Paternal Grandmother   . Stroke Paternal Grandmother    . Other Maternal Grandmother        tonsilitis  . Diabetes Maternal Grandfather   . Hypertension Maternal Grandfather   . Breast cancer Paternal Aunt    Social History   Socioeconomic History  . Marital status: Single    Spouse name: Not on file  . Number of children: 0  . Years of education: Not on file  . Highest education level: Not on file  Social Needs  . Financial resource strain: Not on file  . Food insecurity - worry: Not on file  . Food insecurity - inability: Not on file  . Transportation needs - medical: Not on file  . Transportation needs - non-medical: Not on file  Occupational History  . Occupation: Volunteers  . 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 Smoking status: Former Smoker    Types: Cigarettes    Last attempt to quit: 08/08/1996    Years since quitting: 21.2  . Smokeless tobacco: Never Used  Substance and Sexual Activity  . Alcohol use: No  . Drug use: No  . Sexual activity: No    Birth control/protection: Post-menopausal  Other Topics Concern  . Not on file  Social History Narrative  . Not on file   Past Surgical History:  Procedure Laterality Date  . APPENDECTOMY    . CARDIAC CATHETERIZATION  07/2007, 05/2012  Normal coronary arteries  . COLONOSCOPY  10/18/10   SLF:2-mm sessile sigmoid polyp/diverticula  . CYSTOSCOPY/URETEROSCOPY/HOLMIUM LASER/STENT PLACEMENT Right 08/01/2017   Procedure: CYSTOSCOPY/ RIGHT URETEROSCOPY/ RIGHT RETROGRADE/ HOLMIUM LASER  AND RIGHT STENT PLACEMENT FIRST STAGE;  Surgeon: Irine Seal, MD;  Location: [facility];  Service: Urology;  Laterality: Right;  . CYSTOSCOPY/URETEROSCOPY/HOLMIUM LASER/STENT PLACEMENT Right 08/08/2017   Procedure: RIGHT URETEROSCOPY WITH HOLMIUM LASER RIGHT STENT PLACEMENT;  Surgeon: Irine Seal, MD;  Location: [facility];  Service: Urology;  Laterality: Right;  . EXTRACORPOREAL SHOCK WAVE LITHOTRIPSY Right 08/22/2017   Procedure: RIGHT EXTRACORPOREAL SHOCK  WAVE LITHOTRIPSY (ESWL);  Surgeon: Alexis Frock, MD;  Location: [facility];  Service: Urology;  Laterality: Right;  . HEMORRHOID BANDING  07/2012   Dr. Oneida Alar  . LAPAROSCOPIC CHOLECYSTECTOMY  1985  . LAPAROSCOPIC GASTRIC BANDING  12/12/2010  . LEFT AND RIGHT HEART CATHETERIZATION WITH CORONARY ANGIOGRAM N/A 06/03/2012   Procedure: LEFT AND RIGHT HEART CATHETERIZATION WITH CORONARY ANGIOGRAM;  Surgeon: Jolaine Artist, MD;  Location: [facility] CATH LAB;  Service: Cardiovascular;  Laterality: N/A;  . PILONIDAL CYST EXCISION  1974  . 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 TUMOR EXCISION  10/2011   Face   Past Medical History:  Diagnosis Date  . Adnexal cyst    Right simple cyst seen on [place]  . Anginal pain ([place])   . Anxiety   . Arthritis   . Asthma   . Atrial fibrillation (HCC)    hx of   . CHF (congestive heart failure) ([place])   . Chronic back pain   . Chronic hip pain   . COPD (chronic obstructive pulmonary disease) ([place])   . Coronary artery disease    stents placed   . Dyspnea   . GERD (gastroesophageal reflux disease)   . Headache    due to pinched nerve damaage   . Heart murmur    hx of slight murmur   . Herpes simplex virus (HSV) infection   . History of bronchitis   . History of cardiac catheterization    Normal coronaries 2013  . History of kidney stones   . Hot flashes   . Hypertension   . Hypertriglyceridemia   . IBS (irritable bowel syndrome)   . Internal hemorrhoids   . Morbid obesity ([place])    s/p lap band surgery  . Myocardial infarction ([place])    times 3  . Obesity   . Pulmonary HTN ([place])    [place] 2008:  RA pressures 13/10 with a mean of 7 mmHg.  RV pressure 31/54 with end diastolic pressure of 15 mmHg.  PA pressure 49/23 with a mean of 35 mmHg.  Pulmonary capillary wedge 17/50 with a mean of 14 mmHg. The PA saturation was 68%.  RA saturation was 71% and aortic saturation was 90%.  Cardiac output was 6.0 with a cardiac index of 2.80 by Fick.  Normal coronaries by cath  2008.  Marland Kitchen Renal insufficiency   . Sciatica of right side   . Sleep apnea    CPAP use does not know settings   . Type 2 diabetes mellitus ([place])    type II   . Uterine fibroid    LMP 06/06/2013   Opioid Risk Score:   Fall Risk Score:  `1  Depression screen PHQ 2/9  Depression screen [facility] 2/9 01/17/2016 07/25/2015 07/25/2015  Decreased Interest 0 0 0  Down, Depressed, Hopeless 0 0 0  PHQ - 2 Score 0 0 0  Altered  sleeping - 2 -  Tired, decreased energy - 2 -  Change in appetite - 1 -  Feeling bad or failure about yourself  - 0 -  Trouble concentrating - 0 -  Moving slowly or fidgety/restless - 0 -  Suicidal thoughts - 0 -  PHQ-9 Score - 5 -     Review of Systems  Constitutional: Negative.   HENT: Negative.   Eyes: Negative.   Respiratory: Positive for apnea, shortness of breath and wheezing.   Cardiovascular: Negative.   Gastrointestinal: Positive for abdominal pain.  Endocrine: Negative.   Genitourinary: Negative.   Musculoskeletal: Positive for joint swelling.  Skin: Negative.   Allergic/Immunologic: Negative.   Neurological: Negative.   Hematological: Negative.   Psychiatric/Behavioral: Negative.   All other systems reviewed and are negative.      Objective:   Physical Exam  Gen: morbidly obese Cardiac: Regular rate Pulmonary: Normal effort r Abd: paniculus unchanged,  Cranial nerves are grossly intact.  Coordination is grossly intact  Reflexes are 2+ in the upper extremities and 1+ to 2+in both lower extremities  Motor strength remains 5/5 upper extremities, 5/5 lower extremities. .  Slow to stand. Gait remains slow, wide-based.  Right greater trochanter tender to palpation.  Left greater trochanter less or so.  She has elevation of the right hemipelvis when she stands she has associated levoscoliosis of lumbar spine as result.  She is tender throughout the lower lumbar spine as well as both PSIS areas.  She is antalgic with gait.  She is able to almost touch  her toes however.  Cervical spine range of motion is stable Neurological: Motor exam is 5 out of 5.  She has some inconsistent sensory findings in both legs.  Cognitively she is intact  .  Greater trochs are tender. She could bend to 50 degrees.  c-spine limited with ROM   Assessment & Plan:  1. Lumbago with known facet arthropathy/L4-5 anterolisthesis, pain with lumbar extension May have some mild root involvement, the some numbness in the right lower extremity without obvious weakness. Dextroscoliosis and maladaptive pelvic posture 2. Bilateral ight trochanteric bursitis/iliotibial band syndrome, right more than left.  3. ?Right L3 radiculopathy by history and recent MRI  4. Morbid obesity  5. ?FMS  6. Cervicalgia 7. Recent admission for urosepsis. Recurrent UTI/kidney stones   Plan:  1. Gabapentin 300mg  TID---needs to take as scheduled.  Cymbalta?  2. Reviewed urinary issues at length. She will be following up with Dr. Jeffie Pollock.  3. After informed consent and preparation of the skin with betadine and isopropyl alcohol, I injected 6mg  (1cc) of celestone and 4cc of 1% lidocaine around  the right greater trochanter via lateral approach. Additionally, aspiration was performed prior to injection. The patient tolerated well, and no complications were encountered. Afterward the area was cleaned and dressed. Post- injection instructions were provided.   can consider other intervention for her lumbar spine as well.  4. She is currently on oxycodone per primary.   5. Soma and tizanidine are helpful particularly at night.   5. Made a referral to outpt PT to address posture/mechanics/HEP. This is her biggest issue at this point.  6. 31minutes of face to face patient care time were spent during this visit. All questions were encouraged and answered. I'll see her back in 2 months with  NP

## 2017-11-20 NOTE — Patient Instructions (Signed)
PLEASE FEEL FREE TO CALL OUR OFFICE WITH ANY PROBLEMS OR QUESTIONS ([phone])      

## 2017-11-26 ENCOUNTER — Other Ambulatory Visit: Payer: Self-pay | Admitting: Urology

## 2017-11-26 DIAGNOSIS — N200 Calculus of kidney: Secondary | ICD-10-CM

## 2017-11-27 ENCOUNTER — Telehealth (HOSPITAL_COMMUNITY): Payer: Self-pay

## 2017-11-27 ENCOUNTER — Ambulatory Visit (INDEPENDENT_AMBULATORY_CARE_PROVIDER_SITE_OTHER): Payer: Medicare HMO | Admitting: Endocrinology

## 2017-11-27 ENCOUNTER — Encounter: Payer: Self-pay | Admitting: Endocrinology

## 2017-11-27 VITALS — BP 174/80 | HR 85 | Wt 274.2 lb

## 2017-11-27 DIAGNOSIS — E119 Type 2 diabetes mellitus without complications: Secondary | ICD-10-CM | POA: Diagnosis not present

## 2017-11-27 DIAGNOSIS — E1165 Type 2 diabetes mellitus with hyperglycemia: Secondary | ICD-10-CM | POA: Diagnosis not present

## 2017-11-27 LAB — POCT GLYCOSYLATED HEMOGLOBIN (HGB A1C): Hemoglobin A1C: 7.5

## 2017-11-27 LAB — VITAMIN B12: Vitamin B-12: 232 pg/mL (ref 211–911)

## 2017-11-27 MED ORDER — DULAGLUTIDE 0.75 MG/0.5ML ~~LOC~~ SOAJ: 0.7500 mg | SUBCUTANEOUS | 11 refills | Status: DC

## 2017-11-27 NOTE — Progress Notes (Signed)
Subjective:    Patient ID: Sara Tran, female    DOB: June 13, 1956, 61 y.o.   MRN: [id number]  HPI pt is referred by for diabetes.  Pt states DM was dx'ed in 2008; he has mild if any neuropathy of the lower extremities, but she has associated CAD and renal failure; she has never been on insulin; pt says his diet and exercise are good; she has never had GDM, pancreatitis, pancreatic surgery, severe hypoglycemia or DKA.  She had gastric banding in 2011.  Past Medical History:  Diagnosis Date  . Adnexal cyst    Right simple cyst seen on [place]  . Anginal pain ([place])   . Anxiety   . Arthritis   . Asthma   . Atrial fibrillation (HCC)    hx of   . CHF (congestive heart failure) ([place])   . Chronic back pain   . Chronic hip pain   . COPD (chronic obstructive pulmonary disease) ([place])   . Coronary artery disease    stents placed   . Dyspnea   . GERD (gastroesophageal reflux disease)   . Headache    due to pinched nerve damaage   . Heart murmur    hx of slight murmur   . Herpes simplex virus (HSV) infection   . History of bronchitis   . History of cardiac catheterization    Normal coronaries 2013  . History of kidney stones   . Hot flashes   . Hypertension   . Hypertriglyceridemia   . IBS (irritable bowel syndrome)   . Internal hemorrhoids   . Morbid obesity ([place])    s/p lap band surgery  . Myocardial infarction ([place])    times 3  . Obesity   . Pulmonary HTN ([place])    [place] 2008:  RA pressures 13/10 with a mean of 7 mmHg.  RV pressure 32/35 with end diastolic pressure of 15 mmHg.  PA pressure 49/23 with a mean of 35 mmHg.  Pulmonary capillary wedge 17/50 with a mean of 14 mmHg. The PA saturation was 68%.  RA saturation was 71% and aortic saturation was 90%.  Cardiac output was 6.0 with a cardiac index of 2.80 by Fick.  Normal coronaries by cath 2008.  Marland Kitchen Renal insufficiency   . Sciatica of right side   . Sleep apnea    CPAP use does not know settings   . Type 2 diabetes mellitus ([place])      type II   . Uterine fibroid     Past Surgical History:  Procedure Laterality Date  . APPENDECTOMY    . CARDIAC CATHETERIZATION  07/2007, 05/2012   Normal coronary arteries  . COLONOSCOPY  10/18/10   SLF:2-mm sessile sigmoid polyp/diverticula  . CYSTOSCOPY/URETEROSCOPY/HOLMIUM LASER/STENT PLACEMENT Right 08/01/2017   Procedure: CYSTOSCOPY/ RIGHT URETEROSCOPY/ RIGHT RETROGRADE/ HOLMIUM LASER  AND RIGHT STENT PLACEMENT FIRST STAGE;  Surgeon: Irine Seal, MD;  [facility];  Service: Urology;  Laterality: Right;  . CYSTOSCOPY/URETEROSCOPY/HOLMIUM LASER/STENT PLACEMENT Right 08/08/2017   Procedure: RIGHT URETEROSCOPY WITH HOLMIUM LASER RIGHT STENT PLACEMENT;  Surgeon: Irine Seal, MD;  [facility];  Service: Urology;  Laterality: Right;  . EXTRACORPOREAL SHOCK WAVE LITHOTRIPSY Right 08/22/2017   Procedure: RIGHT EXTRACORPOREAL SHOCK WAVE LITHOTRIPSY (ESWL);  Surgeon: Alexis Frock, MD;  [facility];  Service: Urology;  Laterality: Right;  . HEMORRHOID BANDING  07/2012   Dr. Oneida Alar  . LAPAROSCOPIC CHOLECYSTECTOMY  1985  . LAPAROSCOPIC GASTRIC BANDING  12/12/2010  . LEFT AND RIGHT HEART  CATHETERIZATION WITH CORONARY ANGIOGRAM N/A 06/03/2012   Procedure: LEFT AND RIGHT HEART CATHETERIZATION WITH CORONARY ANGIOGRAM;  Surgeon: Jolaine Artist, MD;  Location: [facility] CATH LAB;  Service: Cardiovascular;  Laterality: N/A;  . PILONIDAL CYST EXCISION  1974  . 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 TUMOR EXCISION  10/2011   Face    Social History   Socioeconomic History  . Marital status: Single    Spouse name: Not on file  . Number of children: 0  . Years of education: Not on file  . Highest education level: Not on file  Social Needs  . Financial resource strain: Not on file  . Food insecurity - worry: Not on file  . Food insecurity - inability: Not on file  . Transportation needs - medical: Not on file  . Transportation needs - non-medical: Not on file  Occupational History  .  Occupation: Volunteers  . 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 Smoking status: Former Smoker    Types: Cigarettes    Last attempt to quit: 08/08/1996    Years since quitting: 21.3  . Smokeless tobacco: Never Used  Substance and Sexual Activity  . Alcohol use: No  . Drug use: No  . Sexual activity: No    Birth control/protection: Post-menopausal  Other Topics Concern  . Not on file  Social History Narrative  . Not on file    Current Outpatient Medications on File Prior to Visit  Medication Sig Dispense Refill  . albuterol (PROVENTIL HFA;VENTOLIN HFA) 108 (90 BASE) MCG/ACT inhaler Inhale 2 puffs into the lungs every 6 (six) hours as needed. FOR SHORTNESS OF BREATH/WHEEZING    . albuterol (PROVENTIL) (2.5 MG/3ML) 0.083% nebulizer solution Take 2.5 mg by nebulization every 4 (four) hours as needed for wheezing or shortness of breath.     . ALPRAZolam (XANAX) 0.5 MG tablet Take 0.5 mg by mouth 3 (three) times daily as needed. For anxiety/sleep    . amLODipine (NORVASC) 10 MG tablet Take 1 tablet by mouth every evening.     . budesonide (PULMICORT) 180 MCG/ACT inhaler Inhale 2 puffs into the lungs 2 (two) times daily.    . carisoprodol (SOMA) 350 MG tablet TAKE 1 TABLET FOUR TIMES DAILY AS NEEDED  FOR  MUSCLE  SPASM 30 tablet 0  . Cholecalciferol (VITAMIN D3 PO) Take 1 capsule by mouth daily.    . fenofibrate 160 MG tablet Take 160 mg by mouth daily.  0  . fluticasone (FLONASE) 50 MCG/ACT nasal spray Place 1-2 sprays into the nose daily.     Marland Kitchen gabapentin (NEURONTIN) 300 MG capsule Take 300 mg by mouth 3 (three) times daily.    Marland Kitchen ipratropium (ATROVENT) 0.02 % nebulizer solution Take 0.5 mg by nebulization every 6 (six) hours as needed.     . isosorbide mononitrate (IMDUR) 30 MG 24 hr tablet Take 30 mg by mouth every morning.     . loratadine (CLARITIN) 10 MG tablet Take 1 tablet by mouth daily.    Marland Kitchen losartan (COZAAR) 50 MG tablet Take 1  tablet (50 mg total) by mouth daily. 90 tablet 3  . magnesium oxide (MAG-OX) 400 MG tablet Take 400 mg by mouth daily.    . metFORMIN (GLUCOPHAGE-XR) 500 MG 24 hr tablet Take 1 tablet by mouth daily.    . Multiple Vitamins-Minerals (ONE-A-DAY 50 PLUS PO) Take 1 tablet by mouth daily.    Marland Kitchen  nitroGLYCERIN (NITROSTAT) 0.4 MG SL tablet Place 0.4 mg under the tongue every 5 (five) minutes as needed. Reported on 06/14/2016    . omega-3 acid ethyl esters (LOVAZA) 1 g capsule Take by mouth 2 (two) times daily.    . potassium chloride SA (K-DUR,KLOR-CON) 20 MEQ tablet Take 20 mEq by mouth every morning.     Marland Kitchen tiZANidine (ZANAFLEX) 4 MG tablet Take 4 mg by mouth every 8 (eight) hours as needed for muscle spasms.     No current facility-administered medications on file prior to visit.     Allergies  Allergen Reactions  . Bee Venom Anaphylaxis    As a child  . Ancef [Cefazolin] Other (See Comments)    Patient was told she was allergic but doesn't remember what happens  . Aspirin Other (See Comments)    Rectal bleeding  . Ceftin [Cefuroxime Axetil] Swelling    Swollen tongue, "made me crazy"  . Ciprofloxacin Cough    Coughed up blood  . Macrodantin [Nitrofurantoin] Other (See Comments)    Vomited blood and had dark stool.  . Milk-Related Compounds Other (See Comments)    IBS  . Other     Snake venom - not sure of reaction -had problems with feet   . Latex Itching and Rash    Family History  Problem Relation Age of Onset  . Diabetes Mother   . Heart failure Mother   . Breast cancer Sister   . CAD Father   . Hypertension Father   . Cancer Paternal Grandfather   . Hypertension Paternal Grandmother   . Stroke Paternal Grandmother   . Other Maternal Grandmother        tonsilitis  . Diabetes Maternal Grandfather   . Hypertension Maternal Grandfather   . Breast cancer Paternal Aunt     BP (!) 174/80 (BP Location: Left Wrist, Patient Position: Sitting, Cuff Size: Normal)   Pulse 85   Wt  274 lb 3.2 oz (124.4 kg)   LMP 06/06/2013   SpO2 96%   BMI 57.31 kg/m     Review of Systems denies blurry vision, chest pain, n/v, urinary frequency, excessive diaphoresis, memory loss, depression, cold intolerance, rhinorrhea, and easy bruising.  She has fatigue, leg cramps, headache, doe, and weight gain.     Objective:   Physical Exam VS: see vs page GEN: no distress HEAD: head: no deformity eyes: no periorbital swelling, no proptosis external nose and ears are normal mouth: no lesion seen NECK: supple, thyroid is not enlarged CHEST WALL: no deformity LUNGS: clear to auscultation CV: reg rate and rhythm, no murmur ABD: abdomen is soft, nontender.  no hepatosplenomegaly.  not distended.  no hernia MUSCULOSKELETAL: muscle bulk and strength are grossly normal.  no obvious joint swelling.  gait is normal and steady EXTEMITIES: no deformity.  no ulcer on the feet.  feet are of normal color and temp.  no edema PULSES: dorsalis pedis intact bilat.  no carotid bruit NEURO:  cn 2-12 grossly intact.   readily moves all 4's.  sensation is intact to touch on the feet SKIN:  Normal texture and temperature.  No rash or suspicious lesion is visible.   NODES:  None palpable at the neck.  PSYCH: alert, well-oriented.  Does not appear anxious nor depressed.   Lab Results  Component Value Date   HGBA1C 7.5 11/27/2017   Lab Results  Component Value Date   CREATININE 1.67 (H) 11/01/2017   BUN 21 (H) 11/01/2017   NA  138 11/01/2017   K 4.6 11/01/2017   CL 102 11/01/2017   CO2 25 11/01/2017   I have reviewed outside records, and summarized: Pt was noted to have elevated a1c, and referred here.  She was seen for hosp f/u, and labs were ordered.       Assessment & Plan:  Type 2 DM, with CAD: she needs increased rx, if it can be done with a regimen that avoids or minimizes hypoglycemia. Obesity, new to me: we discussed: she declines further surgery. Renal failure: this limits metformin  dosage.   Patient Instructions  good diet and exercise significantly improve the control of your diabetes.  please let me know if you wish to be referred to a dietician.  high blood sugar is very risky to your health.  you should see an eye doctor and dentist every year.  It is very important to get all recommended vaccinations.  Controlling your blood pressure and cholesterol drastically reduces the damage diabetes does to your body.  Those who smoke should quit.  Please discuss these with your doctor.  check your blood sugar 4 times a day.  vary the time of day when you check, between before the 3 meals, and at bedtime.  also check if you have symptoms of your blood sugar being too high or too low.  please keep a record of the readings and bring it to your next appointment here (or you can bring the meter itself).  You can write it on any piece of paper.  please call us sooner if your blood sugar goes below 70, or if you have a lot of readings over 200.   I have sent a prescription to your pharmacy, to add "trulicity."  If and when you have no nausea on this, call us so we can double the dosage.  Please come back for a follow-up appointment in 2 months.

## 2017-11-27 NOTE — Telephone Encounter (Signed)
Pt did not want to schedule until she talked with the MD. We will cx this apptment and wait for her to call us back. NF

## 2017-11-27 NOTE — Patient Instructions (Addendum)
good diet and exercise significantly improve the control of your diabetes.  please let me know if you wish to be referred to a dietician.  high blood sugar is very risky to your health.  you should see an eye doctor and dentist every year.  It is very important to get all recommended vaccinations.  Controlling your blood pressure and cholesterol drastically reduces the damage diabetes does to your body.  Those who smoke should quit.  Please discuss these with your doctor.  check your blood sugar 4 times a day.  vary the time of day when you check, between before the 3 meals, and at bedtime.  also check if you have symptoms of your blood sugar being too high or too low.  please keep a record of the readings and bring it to your next appointment here (or you can bring the meter itself).  You can write it on any piece of paper.  please call us sooner if your blood sugar goes below 70, or if you have a lot of readings over 200.   I have sent a prescription to your pharmacy, to add "trulicity."  If and when you have no nausea on this, call us so we can double the dosage.  Please come back for a follow-up appointment in 2 months.

## 2017-11-28 ENCOUNTER — Ambulatory Visit (HOSPITAL_COMMUNITY): Payer: Medicare HMO | Attending: Internal Medicine

## 2017-11-28 ENCOUNTER — Other Ambulatory Visit: Payer: Self-pay

## 2017-11-28 ENCOUNTER — Ambulatory Visit (HOSPITAL_COMMUNITY): Payer: Medicare HMO

## 2017-11-28 DIAGNOSIS — G4730 Sleep apnea, unspecified: Secondary | ICD-10-CM | POA: Diagnosis not present

## 2017-11-28 DIAGNOSIS — E119 Type 2 diabetes mellitus without complications: Secondary | ICD-10-CM | POA: Diagnosis not present

## 2017-11-28 DIAGNOSIS — E785 Hyperlipidemia, unspecified: Secondary | ICD-10-CM | POA: Insufficient documentation

## 2017-11-28 DIAGNOSIS — J449 Chronic obstructive pulmonary disease, unspecified: Secondary | ICD-10-CM | POA: Insufficient documentation

## 2017-11-28 DIAGNOSIS — I5032 Chronic diastolic (congestive) heart failure: Secondary | ICD-10-CM | POA: Diagnosis not present

## 2017-12-02 ENCOUNTER — Telehealth: Payer: Self-pay | Admitting: Endocrinology

## 2017-12-02 NOTE — Telephone Encounter (Signed)
Patient stated that she need Dr Loanne Drilling email address put on form, trying to get help with trulicity with [facility]  They will faxing over a form.

## 2017-12-04 ENCOUNTER — Telehealth: Payer: Self-pay | Admitting: Endocrinology

## 2017-12-04 NOTE — Telephone Encounter (Signed)
I called patient & left VM with both of our fax numbers. I have not yet seen any paperwork for patient.

## 2017-12-06 NOTE — Telephone Encounter (Signed)
Pt called to ask if we have received a letter from [facility] for this pt for the patient assistance. She cannot afford the trulicity without it

## 2017-12-10 ENCOUNTER — Ambulatory Visit: Payer: Medicare HMO

## 2017-12-17 ENCOUNTER — Telehealth: Payer: Self-pay | Admitting: Endocrinology

## 2017-12-17 NOTE — Telephone Encounter (Signed)
I have called and spoke with patient. Sara Tran is now emailing forms for doctors to fill out, but since we don't have access to do this I have printed off the patient assistance form to be filled out. I stated that I would give the form to Dr. Loanne Drilling to sign & call her when it was done for her to fill out her part of paperwork.

## 2017-12-17 NOTE — Telephone Encounter (Signed)
Pt called about to follow up on medication of Dulaglutide (TRULICITY) 1.24 PY/0.9XI SOPN. Please advise?  Call pt @ [phone]. Thank you!

## 2017-12-18 NOTE — Telephone Encounter (Signed)
[email or web address] fill out form and attach prescription. She will like to get a phone call

## 2017-12-18 NOTE — Telephone Encounter (Signed)
I called patient yesterday & printed off form to be filled out for patient assistance. She stated that now physicians are going to web site & filling our form instead.

## 2017-12-20 ENCOUNTER — Ambulatory Visit (HOSPITAL_COMMUNITY): Payer: Medicare HMO | Attending: Urology

## 2017-12-20 ENCOUNTER — Ambulatory Visit (HOSPITAL_COMMUNITY): Payer: Medicare HMO

## 2017-12-20 ENCOUNTER — Encounter (HOSPITAL_COMMUNITY): Payer: Self-pay

## 2017-12-20 DIAGNOSIS — M170 Bilateral primary osteoarthritis of knee: Secondary | ICD-10-CM | POA: Diagnosis not present

## 2017-12-20 DIAGNOSIS — Z1389 Encounter for screening for other disorder: Secondary | ICD-10-CM | POA: Diagnosis not present

## 2017-12-20 DIAGNOSIS — Z9884 Bariatric surgery status: Secondary | ICD-10-CM | POA: Diagnosis not present

## 2017-12-20 DIAGNOSIS — E119 Type 2 diabetes mellitus without complications: Secondary | ICD-10-CM | POA: Diagnosis not present

## 2017-12-20 DIAGNOSIS — I10 Essential (primary) hypertension: Secondary | ICD-10-CM | POA: Diagnosis not present

## 2017-12-20 DIAGNOSIS — Z6841 Body Mass Index (BMI) 40.0 and over, adult: Secondary | ICD-10-CM | POA: Diagnosis not present

## 2017-12-20 DIAGNOSIS — I200 Unstable angina: Secondary | ICD-10-CM | POA: Diagnosis not present

## 2017-12-20 DIAGNOSIS — Z0001 Encounter for general adult medical examination with abnormal findings: Secondary | ICD-10-CM | POA: Diagnosis not present

## 2017-12-20 DIAGNOSIS — E782 Mixed hyperlipidemia: Secondary | ICD-10-CM | POA: Diagnosis not present

## 2017-12-20 DIAGNOSIS — G4733 Obstructive sleep apnea (adult) (pediatric): Secondary | ICD-10-CM | POA: Diagnosis not present

## 2017-12-25 ENCOUNTER — Ambulatory Visit (HOSPITAL_COMMUNITY)
Admission: RE | Admit: 2017-12-25 | Discharge: 2017-12-25 | Disposition: A | Discharge status: Home / Self Care | Payer: Medicare HMO | Source: Ambulatory Visit | Attending: Urology | Admitting: Urology

## 2017-12-25 DIAGNOSIS — R109 Unspecified abdominal pain: Secondary | ICD-10-CM | POA: Diagnosis not present

## 2017-12-25 DIAGNOSIS — K760 Fatty (change of) liver, not elsewhere classified: Secondary | ICD-10-CM | POA: Diagnosis not present

## 2017-12-25 DIAGNOSIS — D259 Leiomyoma of uterus, unspecified: Secondary | ICD-10-CM | POA: Diagnosis not present

## 2017-12-25 DIAGNOSIS — N200 Calculus of kidney: Secondary | ICD-10-CM | POA: Insufficient documentation

## 2017-12-25 DIAGNOSIS — I700 Atherosclerosis of aorta: Secondary | ICD-10-CM | POA: Insufficient documentation

## 2017-12-26 ENCOUNTER — Encounter: Payer: Self-pay | Admitting: Physical Therapy

## 2017-12-26 ENCOUNTER — Ambulatory Visit: Payer: Medicare HMO | Attending: Physical Medicine & Rehabilitation | Admitting: Physical Therapy

## 2017-12-26 DIAGNOSIS — M6281 Muscle weakness (generalized): Secondary | ICD-10-CM | POA: Diagnosis not present

## 2017-12-26 DIAGNOSIS — R293 Abnormal posture: Secondary | ICD-10-CM | POA: Insufficient documentation

## 2017-12-26 DIAGNOSIS — R262 Difficulty in walking, not elsewhere classified: Secondary | ICD-10-CM | POA: Insufficient documentation

## 2017-12-26 DIAGNOSIS — G8929 Other chronic pain: Secondary | ICD-10-CM | POA: Diagnosis not present

## 2017-12-26 DIAGNOSIS — M545 Low back pain: Secondary | ICD-10-CM | POA: Insufficient documentation

## 2017-12-27 ENCOUNTER — Encounter: Payer: Self-pay | Admitting: Physical Therapy

## 2017-12-27 ENCOUNTER — Ambulatory Visit (INDEPENDENT_AMBULATORY_CARE_PROVIDER_SITE_OTHER): Payer: Medicare HMO | Admitting: Urology

## 2017-12-27 ENCOUNTER — Other Ambulatory Visit (HOSPITAL_COMMUNITY)
Admission: RE | Admit: 2017-12-27 | Discharge: 2017-12-27 | Disposition: A | Discharge status: Home / Self Care | Payer: Medicare HMO | Source: Other Acute Inpatient Hospital | Attending: Urology | Admitting: Urology

## 2017-12-27 DIAGNOSIS — N390 Urinary tract infection, site not specified: Secondary | ICD-10-CM | POA: Diagnosis not present

## 2017-12-27 DIAGNOSIS — N200 Calculus of kidney: Secondary | ICD-10-CM | POA: Diagnosis not present

## 2017-12-27 LAB — URINALYSIS, COMPLETE (UACMP) WITH MICROSCOPIC
Bilirubin Urine: NEGATIVE
Glucose, UA: NEGATIVE mg/dL
Hgb urine dipstick: NEGATIVE
Ketones, ur: NEGATIVE mg/dL
Nitrite: NEGATIVE
Protein, ur: 30 mg/dL — AB
Specific Gravity, Urine: 1.016 (ref 1.005–1.030)
pH: 5 (ref 5.0–8.0)
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
## 2017-12-30 LAB — URINE CULTURE: Culture: >=100000 — AB

## 2018-01-07 ENCOUNTER — Ambulatory Visit: Payer: Medicare HMO | Attending: Physical Medicine & Rehabilitation

## 2018-01-07 DIAGNOSIS — M545 Low back pain: Secondary | ICD-10-CM | POA: Insufficient documentation

## 2018-01-07 DIAGNOSIS — M6281 Muscle weakness (generalized): Secondary | ICD-10-CM | POA: Insufficient documentation

## 2018-01-07 DIAGNOSIS — M542 Cervicalgia: Secondary | ICD-10-CM | POA: Insufficient documentation

## 2018-01-07 DIAGNOSIS — R262 Difficulty in walking, not elsewhere classified: Secondary | ICD-10-CM | POA: Diagnosis not present

## 2018-01-07 DIAGNOSIS — R293 Abnormal posture: Secondary | ICD-10-CM

## 2018-01-07 DIAGNOSIS — M5417 Radiculopathy, lumbosacral region: Secondary | ICD-10-CM | POA: Insufficient documentation

## 2018-01-07 DIAGNOSIS — G8929 Other chronic pain: Secondary | ICD-10-CM | POA: Diagnosis not present

## 2018-01-07 NOTE — Therapy (Signed)
[place], [place], [address] Phone: [phone]   Fax:  [phone]  Physical Therapy Treatment  Patient Details  Name: Sara Tran MRN: [id number] Date of Birth: 04-27-1956 Referring Provider: Dr Boyce Medici    Encounter Date: 01/07/2018  PT End of Session - 01/07/18 1412    Visit Number  2    Number of Visits  16    Date for PT Re-Evaluation  02/21/18    Authorization Type  [employer] MCR HMO     PT Start Time  0215    PT Stop Time  0310    PT Time Calculation (min)  55 min    Activity Tolerance  Patient tolerated treatment well    Behavior During Therapy  [facility] for tasks assessed/performed       Past Medical History:  Diagnosis Date  . Adnexal cyst    Right simple cyst seen on [place]  . Anginal pain ([place])   . Anxiety   . Arthritis   . Asthma   . Atrial fibrillation (HCC)    hx of   . CHF (congestive heart failure) ([place])   . Chronic back pain   . Chronic hip pain   . COPD (chronic obstructive pulmonary disease) ([place])   . Coronary artery disease    stents placed   . Dyspnea   . GERD (gastroesophageal reflux disease)   . Headache    due to pinched nerve damaage   . Heart murmur    hx of slight murmur   . Herpes simplex virus (HSV) infection   . History of bronchitis   . History of cardiac catheterization    Normal coronaries 2013  . History of kidney stones   . Hot flashes   . Hypertension   . Hypertriglyceridemia   . IBS (irritable bowel syndrome)   . Internal hemorrhoids   . Morbid obesity ([place])    s/p lap band surgery  . Myocardial infarction ([place])    times 3  . Obesity   . Pulmonary HTN ([place])    [place] 2008:  RA pressures 13/10 with a mean of 7 mmHg.  RV pressure 19/50 with end diastolic pressure of 15 mmHg.  PA pressure 49/23 with a mean of 35 mmHg.  Pulmonary capillary wedge 17/50 with a mean of 14 mmHg. The PA saturation was 68%.  RA saturation was 71% and aortic saturation was  90%.  Cardiac output was 6.0 with a cardiac index of 2.80 by Fick.  Normal coronaries by cath 2008.  Marland Kitchen Renal insufficiency   . Sciatica of right side   . Sleep apnea    CPAP use does not know settings   . Type 2 diabetes mellitus ([place])    type II   . Uterine fibroid     Past Surgical History:  Procedure Laterality Date  . APPENDECTOMY    . CARDIAC CATHETERIZATION  07/2007, 05/2012   Normal coronary arteries  . COLONOSCOPY  10/18/10   SLF:2-mm sessile sigmoid polyp/diverticula  . CYSTOSCOPY/URETEROSCOPY/HOLMIUM LASER/STENT PLACEMENT Right 08/01/2017   Procedure: CYSTOSCOPY/ RIGHT URETEROSCOPY/ RIGHT RETROGRADE/ HOLMIUM LASER  AND RIGHT STENT PLACEMENT FIRST STAGE;  Surgeon: Irine Seal, MD;  [facility];  Service: Urology;  Laterality: Right;  . CYSTOSCOPY/URETEROSCOPY/HOLMIUM LASER/STENT PLACEMENT Right 08/08/2017   Procedure: RIGHT URETEROSCOPY WITH HOLMIUM LASER RIGHT STENT PLACEMENT;  Surgeon: Irine Seal, MD;  [facility];  Service: Urology;  Laterality: Right;  . EXTRACORPOREAL SHOCK WAVE LITHOTRIPSY Right 08/22/2017  Procedure: RIGHT EXTRACORPOREAL SHOCK WAVE LITHOTRIPSY (ESWL);  Surgeon: Alexis Frock, MD;  [facility]: [facility];  Service: Urology;  Laterality: Right;  . HEMORRHOID BANDING  07/2012   Dr. Oneida Alar  . LAPAROSCOPIC CHOLECYSTECTOMY  1985  . LAPAROSCOPIC GASTRIC BANDING  12/12/2010  . LEFT AND RIGHT HEART CATHETERIZATION WITH CORONARY ANGIOGRAM N/A 06/03/2012   Procedure: LEFT AND RIGHT HEART CATHETERIZATION WITH CORONARY ANGIOGRAM;  Surgeon: Jolaine Artist, MD;  Location: [facility] CATH LAB;  Service: Cardiovascular;  Laterality: N/A;  . PILONIDAL CYST EXCISION  1974  . 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 TUMOR EXCISION  10/2011   Face    There were no vitals filed for this visit.  Subjective Assessment - 01/07/18 1415    Subjective  She reports doing HEP.  HAs spasm intermittantly.   She feels more relaxed after PT episodes of care. Last time stood for 30 min  was this week end during communion. Last time did 30 min housework  she can not remember. She  goes to [facility] 2-3x/week for water exer or machines    Diagnostic tests  CT scan 12/25/17  for kidney stones.     Patient Stated Goals  Walk better and wear  /walk in igh heel shoes.     Currently in Pain?  No/denies    Pain Score  8     Pain Location  Back    Pain Orientation  Mid    Pain Descriptors / Indicators  Aching;Burning    Pain Type  Chronic pain    Pain Onset  More than a month ago    Pain Frequency  Constant    Aggravating Factors   activity with bending /stooping , cooking.     Pain Relieving Factors  lye on lt side                      [facility] Adult PT Treatment/Exercise - 01/07/18 0001      Lumbar Exercises: Stretches   Pelvic Tilt  -- 10 reps 3-5 sec      Lumbar Exercises: Seated   Long Arc Quad on Chair  Right;Left;15 reps;Weights    LAQ on Chair Weights (lbs)  3    Sit to Stand  15 reps    Other Seated Lumbar Exercises  abdominal set x5     Other Seated Lumbar Exercises  clam shell red band with abdomianl breathing x10; ball squeeze with abdonminal breathing 2x10       Knee/Hip Exercises: Stretches   Other Knee/Hip Stretches  Leg in frig ure 4 opp knee bent I+R / ER  active stretch x 10       Knee/Hip Exercises: Seated   Long Arc Quad  Right;Left;15 reps    Long Arc Quad Weight  3 lbs. followed QS for TKE      Knee/Hip Exercises: Supine   Other Supine Knee/Hip Exercises  marching x 12 RT and Lt cued to try to add  abdominals with lift       Modalities   Modalities  Moist Heat      Moist Heat Therapy   Number Minutes Moist Heat  15 Minutes    Moist Heat Location  Lumbar Spine      Manual Therapy   Manual Therapy  Passive ROM;Manual Traction    Passive ROM  hip flexion stretching RT and LT     Manual Traction  to each hip long axis x 75 reps each  PT Short Term Goals - 12/26/17 1629      PT SHORT TERM GOAL #1   Title   Patient will demsotrate a good core contraction with proper breathing     Time  4    Period  Weeks    Status  New      PT SHORT TERM GOAL #2   Title  Patient will improve hip abduction and hip flexion strength to 4/5     Time  4    Period  Weeks    Status  On-going      PT SHORT TERM GOAL #3   Title  Pt to be able to complete 10 mintues of light housework with her back pain not increasing past a 7/10.    Time  4    Period  Weeks    Status  Partially Met      PT SHORT TERM GOAL #4   Title  Pt to be able to stand for ten minutes for social engagement without her back  pain increasing beyond a 7/10     Time  4    Period  Weeks    Status  Not Met        PT Long Term Goals - 12/27/17 5456      PT LONG TERM GOAL #1   Title  Patient will sit in a chair for 1 hour without increased pain in order to rest sitting up     Time  8    Period  Weeks    Status  New      PT LONG TERM GOAL #2   Title  Patient will bend down to pick item off the floor without increased pain     Time  8    Period  Weeks    Status  New      PT LONG TERM GOAL #3   Title  Patient will ambualte 500' without increased trendelenburgh gait    Time  8    Period  Weeks    Status  New      PT LONG TERM GOAL #4   Title  Patient will demsotrate a 59% limitation on FOTO.    Time  8    Period  Weeks    Status  On-going            Plan - 01/07/18 1413    Clinical Impression Statement  No apperent distress and she reported 10/10 pain and after discussion abbout the scale she said 8-9/10 in back.   She wa not specific when asked about time on feet and activity level at home and presented 4 inch high eels as goal to be able to walk in these for 30 min in and out of home. .  She has not done this in years.   She also reports going to gymt being tolerant of any extended home tasks on feet.  but     PT Treatment/Interventions  ADLs/Self Care Home Management;Cryotherapy;Electrical  Stimulation;Ultrasound;Iontophoresis 62m/ml Dexamethasone;Gait training;Stair training;Neuromuscular re-education;Patient/family education;Passive range of motion;Dry needling;Taping;Manual techniques    PT Next Visit Plan  give tennis ball for MFR and reviewroller for the torchanter.  manual therapy.  COnsider posterior hip mobilization; LAD to the bilateral lower extremitys; has some difficulty  with supine positioning.     PT Home Exercise Plan  seated hamstring stretch, LTR, ball squeeze with ab breathing, abdominal breathing, hip abdutcion with abdomnial breathing     Consulted and Agree with Plan  of Care  Patient       Patient will benefit from skilled therapeutic intervention in order to improve the following deficits and impairments:  Abnormal gait, Improper body mechanics, Pain, Postural dysfunction, Decreased mobility, Decreased activity tolerance, Decreased endurance, Decreased range of motion, Difficulty walking, Decreased safety awareness  Visit Diagnosis: Chronic bilateral low back pain without sciatica  Abnormal posture  Muscle weakness (generalized)  Difficulty in walking, not elsewhere classified  Cervicalgia  Radiculopathy, lumbosacral region     Problem List Patient Active Problem List   Diagnosis Date Noted  . Levoscoliosis 11/20/2017  . Staghorn kidney stones 08/08/2017  . GI bleed 05/29/2017  . Sepsis due to urinary tract infection ([place]) 05/29/2017  . UTI (urinary tract infection) 05/29/2017  . Cervicalgia 11/21/2016  . Chronic pain syndrome 10/11/2015  . Lumbar radiculopathy 07/25/2015  . Fibroid 10/13/2013  . Adnexal cyst 10/13/2013  . PMB (postmenopausal bleeding) 10/06/2013  . Unspecified symptom associated with female genital organs 10/06/2013  . Hot flashes 10/06/2013  . Internal hemorrhoids with other complication 36/05/7702  . 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 Hip pain 04/09/2013  . Hemorrhoids, internal 03/12/2013  . Back pain  03/11/2013  . Encounter for therapeutic drug monitoring 03/11/2013  . Trochanteric bursitis 03/11/2013  . Lumbar facet arthropathy 03/11/2013  . Unstable angina ([place]) 06/02/2012  . Acute on chronic renal insufficiency 06/02/2012  . Chest pain 04/22/2012  . IBS (irritable bowel syndrome)-DIARRHEA 01/23/2012  . Morbid obesity ([place]) 08/09/2011  . Diabetes mellitus ([place]) 08/09/2011  . DIARRHEA 10/11/2010  . Chronic diastolic heart failure ([place]) 12/16/2007  . Pulmonary hypertension ([place]) 12/12/2007  . OSTEOARTHRITIS 12/11/2007  . SLEEP APNEA 12/11/2007    Darrel Hoover  PT 01/07/2018, 2:59 PM  [facility] [address] [place], [place], [address] Phone: [phone]   Fax:  [phone]  Name: CHABLIS LOSH MRN: [id number] Date of Birth: [birth year]

## 2018-01-14 ENCOUNTER — Ambulatory Visit: Payer: Medicare HMO | Admitting: Physical Therapy

## 2018-01-14 ENCOUNTER — Encounter: Payer: Self-pay | Admitting: Physical Therapy

## 2018-01-14 DIAGNOSIS — M6281 Muscle weakness (generalized): Secondary | ICD-10-CM | POA: Diagnosis not present

## 2018-01-14 DIAGNOSIS — R262 Difficulty in walking, not elsewhere classified: Secondary | ICD-10-CM | POA: Diagnosis not present

## 2018-01-14 DIAGNOSIS — M545 Low back pain: Secondary | ICD-10-CM | POA: Diagnosis not present

## 2018-01-14 DIAGNOSIS — M542 Cervicalgia: Secondary | ICD-10-CM | POA: Diagnosis not present

## 2018-01-14 DIAGNOSIS — M5417 Radiculopathy, lumbosacral region: Secondary | ICD-10-CM | POA: Diagnosis not present

## 2018-01-14 DIAGNOSIS — G8929 Other chronic pain: Secondary | ICD-10-CM

## 2018-01-14 DIAGNOSIS — R293 Abnormal posture: Secondary | ICD-10-CM | POA: Diagnosis not present

## 2018-01-14 NOTE — Therapy (Signed)
[place], [place], [address] Phone: [phone]   Fax:  [phone]  Physical Therapy Treatment  Patient Details  Name: Sara Tran MRN: [id number] Date of Birth: 1956/03/07 Referring Provider: Dr Boyce Medici    Encounter Date: 01/14/2018  PT End of Session - 01/14/18 1515    Visit Number  3    Number of Visits  16    Date for PT Re-Evaluation  02/21/18    Authorization Type  [employer] M[employer] HMO     PT Start Time  0300    PT Stop Time  0400    PT Time Calculation (min)  60 min       Past Medical History:  Diagnosis Date  . Adnexal cyst    Right simple cyst seen on [place]  . Anginal pain ([place])   . Anxiety   . Arthritis   . Asthma   . Atrial fibrillation (HCC)    hx of   . CHF (congestive heart failure) ([place])   . Chronic back pain   . Chronic hip pain   . COPD (chronic obstructive pulmonary disease) ([place])   . Coronary artery disease    stents placed   . Dyspnea   . GERD (gastroesophageal reflux disease)   . Headache    due to pinched nerve damaage   . Heart murmur    hx of slight murmur   . Herpes simplex virus (HSV) infection   . History of bronchitis   . History of cardiac catheterization    Normal coronaries 2013  . History of kidney stones   . Hot flashes   . Hypertension   . Hypertriglyceridemia   . IBS (irritable bowel syndrome)   . Internal hemorrhoids   . Morbid obesity ([place])    s/p lap band surgery  . Myocardial infarction ([place])    times 3  . Obesity   . Pulmonary HTN ([place])    [place] 2008:  RA pressures 13/10 with a mean of 7 mmHg.  RV pressure 69/62 with end diastolic pressure of 15 mmHg.  PA pressure 49/23 with a mean of 35 mmHg.  Pulmonary capillary wedge 17/50 with a mean of 14 mmHg. The PA saturation was 68%.  RA saturation was 71% and aortic saturation was 90%.  Cardiac output was 6.0 with a cardiac index of 2.80 by Fick.  Normal coronaries by cath 2008.  Marland Kitchen Renal  insufficiency   . Sciatica of right side   . Sleep apnea    CPAP use does not know settings   . Type 2 diabetes mellitus ([place])    type II   . Uterine fibroid     Past Surgical History:  Procedure Laterality Date  . APPENDECTOMY    . CARDIAC CATHETERIZATION  07/2007, 05/2012   Normal coronary arteries  . COLONOSCOPY  10/18/10   SLF:2-mm sessile sigmoid polyp/diverticula  . CYSTOSCOPY/URETEROSCOPY/HOLMIUM LASER/STENT PLACEMENT Right 08/01/2017   Procedure: CYSTOSCOPY/ RIGHT URETEROSCOPY/ RIGHT RETROGRADE/ HOLMIUM LASER  AND RIGHT STENT PLACEMENT FIRST STAGE;  Surgeon: Irine Seal, MD;  Location: [facility];  Service: Urology;  Laterality: Right;  . CYSTOSCOPY/URETEROSCOPY/HOLMIUM LASER/STENT PLACEMENT Right 08/08/2017   Procedure: RIGHT URETEROSCOPY WITH HOLMIUM LASER RIGHT STENT PLACEMENT;  Surgeon: Irine Seal, MD;  Location: [facility];  Service: Urology;  Laterality: Right;  . EXTRACORPOREAL SHOCK WAVE LITHOTRIPSY Right 08/22/2017   Procedure: RIGHT EXTRACORPOREAL SHOCK WAVE LITHOTRIPSY (ESWL);  Surgeon: Alexis Frock, MD;  Location: [facility];  Service: Urology;  Laterality: Right;  . HEMORRHOID BANDING  07/2012   Dr. Oneida Alar  . LAPAROSCOPIC CHOLECYSTECTOMY  1985  . LAPAROSCOPIC GASTRIC BANDING  12/12/2010  . LEFT AND RIGHT HEART CATHETERIZATION WITH CORONARY ANGIOGRAM N/A 06/03/2012   Procedure: LEFT AND RIGHT HEART CATHETERIZATION WITH CORONARY ANGIOGRAM;  Surgeon: Jolaine Artist, MD;  Location: [facility] CATH LAB;  Service: Cardiovascular;  Laterality: N/A;  . PILONIDAL CYST EXCISION  1974  . 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 TUMOR EXCISION  10/2011   Face    There were no vitals filed for this visit.  Subjective Assessment - 01/14/18 1511    Subjective  I am good today. 7/10 pain in lower back.     Currently in Pain?  Yes    Pain Score  7     Pain Location  Back    Pain Orientation  Lower    Pain Descriptors / Indicators  Sharp;Stabbing    Aggravating Factors   sitting                        [facility] Adult PT Treatment/Exercise - 01/14/18 0001      Self-Care   Self-Care  Posture    Posture  Sitting posture, stool under feet for ergonomic set up.       Lumbar Exercises: Seated   Other Seated Lumbar Exercises  abdominal set x5  with alternating LAQ     Other Seated Lumbar Exercises  clam shell red band with abdomianl breathing x10; ball squeeze with abdonminal breathing 2x10       Lumbar Exercises: Supine   AB Set Limitations  abdominal draw in with breathing     Clam  10 reps red band    Other Supine Lumbar Exercises  ball squeeze with abdominal draw in     Other Supine Lumbar Exercises  physioball abdominal set- 5 reps    Pelvic Tilt  -- painful today       Knee/Hip Exercises: Stretches   Other Knee/Hip Stretches  Leg in frig ure 4 opp knee bent I+R / ER  active stretch x 10       Knee/Hip Exercises: Supine   Other Supine Knee/Hip Exercises  marching x 12 RT and Lt cued to try to add  abdominals with lift       Moist Heat Therapy   Number Minutes Moist Heat  15 Minutes    Moist Heat Location  Lumbar Spine seated                PT Short Term Goals - 12/26/17 1629      PT SHORT TERM GOAL #1   Title  Patient will demsotrate a good core contraction with proper breathing     Time  4    Period  Weeks    Status  New      PT SHORT TERM GOAL #2   Title  Patient will improve hip abduction and hip flexion strength to 4/5     Time  4    Period  Weeks    Status  On-going      PT SHORT TERM GOAL #3   Title  Pt to be able to complete 10 mintues of light housework with her back pain not increasing past a 7/10.    Time  4    Period  Weeks    Status  Partially Met      PT SHORT TERM GOAL #4  Title  Pt to be able to stand for ten minutes for social engagement without her back  pain increasing beyond a 7/10     Time  4    Period  Weeks    Status  Not Met        PT Long Term Goals - 12/27/17 9147      PT LONG TERM GOAL #1    Title  Patient will sit in a chair for 1 hour without increased pain in order to rest sitting up     Time  8    Period  Weeks    Status  New      PT LONG TERM GOAL #2   Title  Patient will bend down to pick item off the floor without increased pain     Time  8    Period  Weeks    Status  New      PT LONG TERM GOAL #3   Title  Patient will ambualte 500' without increased trendelenburgh gait    Time  8    Period  Weeks    Status  New      PT LONG TERM GOAL #4   Title  Patient will demsotrate a 59% limitation on FOTO.    Time  8    Period  Weeks    Status  On-going            Plan - 01/14/18 1515    Clinical Impression Statement  Pt rerports less pain today. Discussed seated posture and the need for stool at work. Focused hip stretch and abdominal activation exercises . Pt delighted to see she lost 12 pounds per our weight scale. A little sore at end of session from laying in supine.     PT Next Visit Plan  give tennis ball for MFR and reviewroller for the torchanter.  manual therapy.  COnsider posterior hip mobilization; LAD to the bilateral lower extremitys; has some difficulty  with supine positioning.     PT Home Exercise Plan  seated hamstring stretch, LTR, ball squeeze with ab breathing, abdominal breathing, hip abdutcion with abdomnial breathing     Consulted and Agree with Plan of Care  Patient       Patient will benefit from skilled therapeutic intervention in order to improve the following deficits and impairments:  Abnormal gait, Improper body mechanics, Pain, Postural dysfunction, Decreased mobility, Decreased activity tolerance, Decreased endurance, Decreased range of motion, Difficulty walking, Decreased safety awareness  Visit Diagnosis: Chronic bilateral low back pain without sciatica  Abnormal posture  Muscle weakness (generalized)  Difficulty in walking, not elsewhere classified     Problem List Patient Active Problem List   Diagnosis Date Noted   . Levoscoliosis 11/20/2017  . Staghorn kidney stones 08/08/2017  . GI bleed 05/29/2017  . Sepsis due to urinary tract infection ([place]) 05/29/2017  . UTI (urinary tract infection) 05/29/2017  . Cervicalgia 11/21/2016  . Chronic pain syndrome 10/11/2015  . Lumbar radiculopathy 07/25/2015  . Fibroid 10/13/2013  . Adnexal cyst 10/13/2013  . PMB (postmenopausal bleeding) 10/06/2013  . Unspecified symptom associated with female genital organs 10/06/2013  . Hot flashes 10/06/2013  . Internal hemorrhoids with other complication 82/95/6213  . 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 Hip pain 04/09/2013  . Hemorrhoids, internal 03/12/2013  . Back pain 03/11/2013  . Encounter for therapeutic drug monitoring 03/11/2013  . Trochanteric bursitis 03/11/2013  . Lumbar facet arthropathy 03/11/2013  . Unstable angina ([place]) 06/02/2012  .  Acute on chronic renal insufficiency 06/02/2012  . Chest pain 04/22/2012  . IBS (irritable bowel syndrome)-DIARRHEA 01/23/2012  . Morbid obesity ([place]) 08/09/2011  . Diabetes mellitus ([place]) 08/09/2011  . DIARRHEA 10/11/2010  . Chronic diastolic heart failure ([place]) 12/16/2007  . Pulmonary hypertension ([place]) 12/12/2007  . OSTEOARTHRITIS 12/11/2007  . SLEEP APNEA 12/11/2007    Dorene Ar, PTA 01/14/2018, 4:09 PM  [facility] [address] [place], [place], [address] Phone: [phone]   Fax:  [phone]  Name: Sara Tran MRN: [id number] Date of Birth: 1956-03-24

## 2018-01-17 ENCOUNTER — Other Ambulatory Visit: Payer: Self-pay | Admitting: Endocrinology

## 2018-01-17 MED ORDER — DULAGLUTIDE 0.75 MG/0.5ML ~~LOC~~ SOAJ: 0.7500 mg | SUBCUTANEOUS | 3 refills | Status: DC

## 2018-01-20 ENCOUNTER — Ambulatory Visit: Payer: Medicare HMO | Admitting: Registered Nurse

## 2018-01-21 ENCOUNTER — Encounter: Payer: Medicare HMO | Admitting: Physical Therapy

## 2018-01-24 ENCOUNTER — Ambulatory Visit: Payer: Medicare HMO | Admitting: Urology

## 2018-01-27 ENCOUNTER — Telehealth: Payer: Self-pay | Admitting: Endocrinology

## 2018-01-27 ENCOUNTER — Ambulatory Visit: Payer: Medicare HMO | Admitting: Physical Therapy

## 2018-01-27 NOTE — Telephone Encounter (Signed)
Pt is requesting a call if we hear anything from [employer]illy the patient assistance program please

## 2018-01-28 ENCOUNTER — Ambulatory Visit: Payer: Medicare HMO | Admitting: Endocrinology

## 2018-01-31 NOTE — Telephone Encounter (Signed)
I called & LVM that I had not received anything from [facility] yet. I stated that she may want to call & follow up with them.

## 2018-02-04 ENCOUNTER — Ambulatory Visit: Payer: Medicare HMO | Attending: Physical Medicine & Rehabilitation | Admitting: Physical Therapy

## 2018-02-04 ENCOUNTER — Encounter: Payer: Self-pay | Admitting: Physical Therapy

## 2018-02-04 DIAGNOSIS — M545 Low back pain: Secondary | ICD-10-CM | POA: Insufficient documentation

## 2018-02-04 DIAGNOSIS — R293 Abnormal posture: Secondary | ICD-10-CM | POA: Diagnosis not present

## 2018-02-04 DIAGNOSIS — G8929 Other chronic pain: Secondary | ICD-10-CM | POA: Diagnosis not present

## 2018-02-04 DIAGNOSIS — M6281 Muscle weakness (generalized): Secondary | ICD-10-CM

## 2018-02-04 DIAGNOSIS — R262 Difficulty in walking, not elsewhere classified: Secondary | ICD-10-CM | POA: Diagnosis not present

## 2018-02-04 NOTE — Therapy (Addendum)
[place], [place], [address] Phone: [phone]   Fax:  [phone]  Physical Therapy Treatment/ Discharge   Patient Details  Name: Sara Tran MRN: [id number] Date of Birth: Jun 27, 1956 Referring Provider: Dr Boyce Medici    Encounter Date: 02/04/2018  PT End of Session - 02/04/18 1530    Visit Number  4    Number of Visits  16    Date for PT Re-Evaluation  02/21/18    Authorization Type  [employer] MCR HMO     PT Start Time  0309 9 minutes late    PT Stop Time  0400    PT Time Calculation (min)  51 min       Past Medical History:  Diagnosis Date  . Adnexal cyst    Right simple cyst seen on [place]  . Anginal pain ([place])   . Anxiety   . Arthritis   . Asthma   . Atrial fibrillation (HCC)    hx of   . CHF (congestive heart failure) ([place])   . Chronic back pain   . Chronic hip pain   . COPD (chronic obstructive pulmonary disease) ([place])   . Coronary artery disease    stents placed   . Dyspnea   . GERD (gastroesophageal reflux disease)   . Headache    due to pinched nerve damaage   . Heart murmur    hx of slight murmur   . Herpes simplex virus (HSV) infection   . History of bronchitis   . History of cardiac catheterization    Normal coronaries 2013  . History of kidney stones   . Hot flashes   . Hypertension   . Hypertriglyceridemia   . IBS (irritable bowel syndrome)   . Internal hemorrhoids   . Morbid obesity ([place])    s/p lap band surgery  . Myocardial infarction ([place])    times 3  . Obesity   . Pulmonary HTN ([place])    [place] 2008:  RA pressures 13/10 with a mean of 7 mmHg.  RV pressure 28/31 with end diastolic pressure of 15 mmHg.  PA pressure 49/23 with a mean of 35 mmHg.  Pulmonary capillary wedge 17/50 with a mean of 14 mmHg. The PA saturation was 68%.  RA saturation was 71% and aortic saturation was 90%.  Cardiac output was 6.0 with a cardiac index of 2.80 by Fick.  Normal coronaries by cath  2008.  Marland Kitchen Renal insufficiency   . Sciatica of right side   . Sleep apnea    CPAP use does not know settings   . Type 2 diabetes mellitus ([place])    type II   . Uterine fibroid     Past Surgical History:  Procedure Laterality Date  . APPENDECTOMY    . CARDIAC CATHETERIZATION  07/2007, 05/2012   Normal coronary arteries  . COLONOSCOPY  10/18/10   SLF:2-mm sessile sigmoid polyp/diverticula  . CYSTOSCOPY/URETEROSCOPY/HOLMIUM LASER/STENT PLACEMENT Right 08/01/2017   Procedure: CYSTOSCOPY/ RIGHT URETEROSCOPY/ RIGHT RETROGRADE/ HOLMIUM LASER  AND RIGHT STENT PLACEMENT FIRST STAGE;  Surgeon: Irine Seal, MD;  [facility];  Service: Urology;  Laterality: Right;  . CYSTOSCOPY/URETEROSCOPY/HOLMIUM LASER/STENT PLACEMENT Right 08/08/2017   Procedure: RIGHT URETEROSCOPY WITH HOLMIUM LASER RIGHT STENT PLACEMENT;  Surgeon: Irine Seal, MD;  [facility]: [facility];  Service: Urology;  Laterality: Right;  . EXTRACORPOREAL SHOCK WAVE LITHOTRIPSY Right 08/22/2017   Procedure: RIGHT EXTRACORPOREAL SHOCK WAVE LITHOTRIPSY (ESWL);  Surgeon: Alexis Frock, MD;  Location:  [facility];  Service: Urology;  Laterality: Right;  . HEMORRHOID BANDING  07/2012   Dr. Oneida Alar  . LAPAROSCOPIC CHOLECYSTECTOMY  1985  . LAPAROSCOPIC GASTRIC BANDING  12/12/2010  . LEFT AND RIGHT HEART CATHETERIZATION WITH CORONARY ANGIOGRAM N/A 06/03/2012   Procedure: LEFT AND RIGHT HEART CATHETERIZATION WITH CORONARY ANGIOGRAM;  Surgeon: Jolaine Artist, MD;  Location: [facility] CATH LAB;  Service: Cardiovascular;  Laterality: N/A;  . PILONIDAL CYST EXCISION  1974  . 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 TUMOR EXCISION  10/2011   Face    There were no vitals filed for this visit.  Subjective Assessment - 02/04/18 1513    Subjective  I have not been good. I have been locking up.     Currently in Pain?  Yes    Pain Score  -- between moderate and severe    Pain Location  Back    Pain Radiating Towards  Right hip and          [facility] PT Assessment -  02/04/18 0001      Strength   Right Hip Flexion  4-/5    Right Hip ABduction  4/5    Left Hip Flexion  4/5    Left Hip ABduction  4-/5                  [facility] Adult PT Treatment/Exercise - 02/04/18 0001      Lumbar Exercises: Seated   Sit to Stand  10 reps      Lumbar Exercises: Supine   Clam  10 reps red    Bridge  10 reps    Bridge with clamshell  10 reps yellow      Knee/Hip Exercises: Sidelying   Hip ABduction  Strengthening;10 reps;AROM    Clams  x 15 each              PT Education - 02/04/18 1641    Education provided  Yes    Education Details  HEP    Person(s) Educated  Patient    Methods  Explanation;Handout    Comprehension  Verbalized understanding       PT Short Term Goals - 02/04/18 1523      PT SHORT TERM GOAL #1   Title  Patient will demsotrate a good core contraction with proper breathing     Time  4    Period  Weeks    Status  Achieved      PT SHORT TERM GOAL #2   Title  Patient will improve hip abduction and hip flexion strength to 4/5     Baseline  right hip flexion 4-/5, others 4/10     Time  4    Period  Weeks    Status  Partially Met      PT SHORT TERM GOAL #3   Title  Pt to be able to complete 10 mintues of light housework with her back pain not increasing past a 7/10.    Baseline  Pain above 7/10 with 10 minutes of light housework     Time  4    Period  Weeks    Status  On-going      PT SHORT TERM GOAL #4   Title  Pt to be able to stand for ten minutes for social engagement without her back  pain increasing beyond a 7/10     Baseline  unable     Time  4    Period  Weeks    Status  On-going        PT Long Term Goals - 12/27/17 0953      PT LONG TERM GOAL #1   Title  Patient will sit in a chair for 1 hour without increased pain in order to rest sitting up     Time  8    Period  Weeks    Status  New      PT LONG TERM GOAL #2   Title  Patient will bend down to pick item off the floor without increased pain      Time  8    Period  Weeks    Status  New      PT LONG TERM GOAL #3   Title  Patient will ambualte 500' without increased trendelenburgh gait    Time  8    Period  Weeks    Status  New      PT LONG TERM GOAL #4   Title  Patient will demsotrate a 59% limitation on FOTO.    Time  8    Period  Weeks    Status  On-going            Plan - 02/04/18 1533    Clinical Impression Statement  Went to [employer] and did not have a scooter so had to use cart.  Had increased pain next day and feels right hip has been locking up in the mornings keeping her from standing up from her bed. She feels her pelvis is always shifting, Has not received pelvic floor PT. Her hip strength has improved. She can maintain an abdominal contraction with breathing. STG#1 met and STG# 2 partially met. She is still limitied by pain with most ADLs.     PT Next Visit Plan  give tennis ball for MFR and reviewroller for the torchanter.  manual therapy.  COnsider posterior hip mobilization; LAD to the bilateral lower extremitys; has some difficulty  with supine positioning.     PT Home Exercise Plan  seated hamstring stretch, LTR, ball squeeze with ab breathing, abdominal breathing, hip abdutcion with abdomnial breathing, supine/seated clam, supine bridge with clam    Consulted and Agree with Plan of Care  Patient       Patient will benefit from skilled therapeutic intervention in order to improve the following deficits and impairments:  Abnormal gait, Improper body mechanics, Pain, Postural dysfunction, Decreased mobility, Decreased activity tolerance, Decreased endurance, Decreased range of motion, Difficulty walking, Decreased safety awareness  Visit Diagnosis: Chronic bilateral low back pain without sciatica  Abnormal posture  Muscle weakness (generalized)  Difficulty in walking, not elsewhere classified   PHYSICAL THERAPY DISCHARGE SUMMARY  Visits from Start of Care: 4  Current functional level related to  goals / functional outcomes: Continued pain     Remaining deficits: Unknown patient did not return    Education / Equipment: HEP  Plan: Patient agrees to discharge.  Patient goals were not met. Patient is being discharged due to not returning since the last visit.  ?????      Problem List Patient Active Problem List   Diagnosis Date Noted  . Levoscoliosis 11/20/2017  . Staghorn kidney stones 08/08/2017  . GI bleed 05/29/2017  . Sepsis due to urinary tract infection ([place]) 05/29/2017  . UTI (urinary tract infection) 05/29/2017  . Cervicalgia 11/21/2016  . Chronic pain syndrome 10/11/2015  . Lumbar radiculopathy 07/25/2015  . Fibroid 10/13/2013  . Adnexal cyst 10/13/2013  .  PMB (postmenopausal bleeding) 10/06/2013  . Unspecified symptom associated with female genital organs 10/06/2013  . Hot flashes 10/06/2013  . Internal hemorrhoids with other complication 74/82/7078  . 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 Hip pain 04/09/2013  . Hemorrhoids, internal 03/12/2013  . Back pain 03/11/2013  . Encounter for therapeutic drug monitoring 03/11/2013  . Trochanteric bursitis 03/11/2013  . Lumbar facet arthropathy 03/11/2013  . Unstable angina ([place]) 06/02/2012  . Acute on chronic renal insufficiency 06/02/2012  . Chest pain 04/22/2012  . IBS (irritable bowel syndrome)-DIARRHEA 01/23/2012  . Morbid obesity ([place]) 08/09/2011  . Diabetes mellitus ([place]) 08/09/2011  . DIARRHEA 10/11/2010  . Chronic diastolic heart failure ([place]) 12/16/2007  . Pulmonary hypertension ([place]) 12/12/2007  . OSTEOARTHRITIS 12/11/2007  . SLEEP APNEA 12/11/2007   Carolyne Littles PT DPT  03/04/2018 Dorene Ar, PTA 02/04/2018, 4:52 PM  [facility] [address] [place], [place], [address] Phone: [phone]   Fax:  [phone]  Name: Sara Tran MRN: [id number] Date of Birth: September 16, 1956

## 2018-02-11 ENCOUNTER — Ambulatory Visit: Payer: Medicare HMO | Admitting: Physical Therapy

## 2018-02-14 ENCOUNTER — Other Ambulatory Visit (HOSPITAL_COMMUNITY)
Admission: RE | Admit: 2018-02-14 | Discharge: 2018-02-14 | Disposition: A | Discharge status: Home / Self Care | Payer: Medicare HMO | Source: Other Acute Inpatient Hospital | Attending: Urology | Admitting: Urology

## 2018-02-14 ENCOUNTER — Ambulatory Visit: Payer: Medicare HMO | Admitting: Urology

## 2018-02-14 DIAGNOSIS — N3941 Urge incontinence: Secondary | ICD-10-CM | POA: Diagnosis not present

## 2018-02-14 DIAGNOSIS — N200 Calculus of kidney: Secondary | ICD-10-CM

## 2018-02-14 DIAGNOSIS — N390 Urinary tract infection, site not specified: Secondary | ICD-10-CM

## 2018-02-14 LAB — URINALYSIS, COMPLETE (UACMP) WITH MICROSCOPIC
Bilirubin Urine: NEGATIVE
Glucose, UA: 150 mg/dL — AB
Hgb urine dipstick: NEGATIVE
Ketones, ur: NEGATIVE mg/dL
Leukocytes, UA: NEGATIVE
Nitrite: POSITIVE — AB
Protein, ur: 100 mg/dL — AB
Specific Gravity, Urine: 1.017 (ref 1.005–1.030)
pH: 5 (ref 5.0–8.0)

## 2018-02-17 LAB — URINE CULTURE: Culture: >=100000 — AB

## 2018-02-18 ENCOUNTER — Ambulatory Visit: Payer: Medicare HMO | Admitting: Physical Therapy

## 2018-02-19 ENCOUNTER — Ambulatory Visit: Payer: Medicare HMO | Admitting: Endocrinology

## 2018-02-19 DIAGNOSIS — Z0289 Encounter for other administrative examinations: Secondary | ICD-10-CM

## 2018-02-21 DIAGNOSIS — M1991 Primary osteoarthritis, unspecified site: Secondary | ICD-10-CM | POA: Diagnosis not present

## 2018-02-21 DIAGNOSIS — Z6841 Body Mass Index (BMI) 40.0 and over, adult: Secondary | ICD-10-CM | POA: Diagnosis not present

## 2018-02-21 DIAGNOSIS — M25551 Pain in right hip: Secondary | ICD-10-CM | POA: Diagnosis not present

## 2018-02-21 DIAGNOSIS — E119 Type 2 diabetes mellitus without complications: Secondary | ICD-10-CM | POA: Diagnosis not present

## 2018-03-10 ENCOUNTER — Encounter: Payer: Medicare HMO | Admitting: Physical Medicine & Rehabilitation

## 2018-03-11 DIAGNOSIS — G4733 Obstructive sleep apnea (adult) (pediatric): Secondary | ICD-10-CM | POA: Diagnosis not present

## 2018-03-12 ENCOUNTER — Ambulatory Visit: Payer: Medicare HMO | Admitting: Internal Medicine

## 2018-03-19 ENCOUNTER — Encounter: Payer: Medicare HMO | Attending: Registered Nurse | Admitting: Physical Medicine & Rehabilitation

## 2018-03-20 DIAGNOSIS — N183 Chronic kidney disease, stage 3 (moderate): Secondary | ICD-10-CM | POA: Diagnosis not present

## 2018-03-20 DIAGNOSIS — G894 Chronic pain syndrome: Secondary | ICD-10-CM | POA: Diagnosis not present

## 2018-03-20 DIAGNOSIS — Z6841 Body Mass Index (BMI) 40.0 and over, adult: Secondary | ICD-10-CM | POA: Diagnosis not present

## 2018-03-20 DIAGNOSIS — Z1389 Encounter for screening for other disorder: Secondary | ICD-10-CM | POA: Diagnosis not present

## 2018-03-20 DIAGNOSIS — M1991 Primary osteoarthritis, unspecified site: Secondary | ICD-10-CM | POA: Diagnosis not present

## 2018-03-25 ENCOUNTER — Ambulatory Visit: Payer: Medicare HMO | Admitting: Internal Medicine

## 2018-05-23 ENCOUNTER — Ambulatory Visit: Payer: Medicare HMO | Admitting: Urology

## 2018-06-20 ENCOUNTER — Other Ambulatory Visit: Payer: Self-pay

## 2018-06-20 ENCOUNTER — Emergency Department (HOSPITAL_COMMUNITY)
Admission: EM | Admit: 2018-06-20 | Discharge: 2018-06-20 | Disposition: A | Discharge status: Home / Self Care | Payer: Medicare HMO | Attending: Emergency Medicine | Admitting: Emergency Medicine

## 2018-06-20 ENCOUNTER — Encounter (HOSPITAL_COMMUNITY): Payer: Self-pay | Admitting: Emergency Medicine

## 2018-06-20 ENCOUNTER — Emergency Department (HOSPITAL_COMMUNITY): Payer: Medicare HMO

## 2018-06-20 DIAGNOSIS — Z7984 Long term (current) use of oral hypoglycemic drugs: Secondary | ICD-10-CM | POA: Insufficient documentation

## 2018-06-20 DIAGNOSIS — I509 Heart failure, unspecified: Secondary | ICD-10-CM | POA: Diagnosis not present

## 2018-06-20 DIAGNOSIS — E119 Type 2 diabetes mellitus without complications: Secondary | ICD-10-CM | POA: Insufficient documentation

## 2018-06-20 DIAGNOSIS — Z79899 Other long term (current) drug therapy: Secondary | ICD-10-CM | POA: Insufficient documentation

## 2018-06-20 DIAGNOSIS — R109 Unspecified abdominal pain: Secondary | ICD-10-CM | POA: Diagnosis not present

## 2018-06-20 DIAGNOSIS — R0981 Nasal congestion: Secondary | ICD-10-CM | POA: Diagnosis not present

## 2018-06-20 DIAGNOSIS — J45909 Unspecified asthma, uncomplicated: Secondary | ICD-10-CM | POA: Diagnosis not present

## 2018-06-20 DIAGNOSIS — Z87891 Personal history of nicotine dependence: Secondary | ICD-10-CM | POA: Diagnosis not present

## 2018-06-20 DIAGNOSIS — N390 Urinary tract infection, site not specified: Secondary | ICD-10-CM | POA: Insufficient documentation

## 2018-06-20 DIAGNOSIS — I110 Hypertensive heart disease with heart failure: Secondary | ICD-10-CM | POA: Diagnosis not present

## 2018-06-20 DIAGNOSIS — R1030 Lower abdominal pain, unspecified: Secondary | ICD-10-CM | POA: Diagnosis present

## 2018-06-20 LAB — URINALYSIS, ROUTINE W REFLEX MICROSCOPIC
Bilirubin Urine: NEGATIVE
Glucose, UA: NEGATIVE mg/dL
Hgb urine dipstick: NEGATIVE
Ketones, ur: NEGATIVE mg/dL
Nitrite: NEGATIVE
Protein, ur: 100 mg/dL — AB
Specific Gravity, Urine: 1.025 (ref 1.005–1.030)
pH: 5 (ref 5.0–8.0)

## 2018-06-20 MED ORDER — AMOXICILLIN-POT CLAVULANATE 875-125 MG PO TABS: 1.0000 | ORAL_TABLET | Freq: Two times a day (BID) | ORAL | 0 refills | Status: DC

## 2018-06-20 MED ORDER — ONDANSETRON HCL 4 MG PO TABS
4.0000 mg | ORAL_TABLET | Freq: Once | ORAL | Status: AC
  Administered 2018-06-20: 4 mg via ORAL
  Filled 2018-06-20: qty 1

## 2018-06-20 MED ORDER — AMOXICILLIN-POT CLAVULANATE 875-125 MG PO TABS
1.0000 | ORAL_TABLET | Freq: Once | ORAL | Status: AC
  Administered 2018-06-20: 1 via ORAL
  Filled 2018-06-20: qty 1

## 2018-06-20 NOTE — Discharge Instructions (Addendum)
Your urine test suggest a tract infection.  The CT scan of your abdomen and pelvis is negative for any obstructing stones, or other obstructive problems.  Your vital signs are essentially within normal limits.  Please discuss your symptoms with Dr. Jeffie Pollock, or a member of his team.  Please increase fluids.  Please use Augmentin 2 times daily with food.  A culture of your urine has been sent to the lab.  Please call Dr. Jeffie Pollock on Monday for follow up and management assistance.

## 2018-06-20 NOTE — ED Notes (Signed)
Pt reports she is a pt of Dr Jeffie Pollock She reports UTI sx, denies new romantic partner  Reports incontinence -   HB in to discuss with

## 2018-06-20 NOTE — ED Notes (Signed)
Pt complaint of rash to R side of inner labial lip Complaint of itching Unable to visualize a rash, but noted 2 small areas the size of pin head   Pt reports that the area has been checked for STDs, she has applied "high powered" cream to the area  HB informed of findings

## 2018-06-20 NOTE — ED Triage Notes (Signed)
Pt states history of recurrent of UTI since weekend. Pt states urine has odor.

## 2018-06-20 NOTE — ED Provider Notes (Signed)
[facility] EMERGENCY DEPARTMENT Provider Note   CSN: [id number] Arrival date & time: 06/20/18  1605     History   Chief Complaint Chief Complaint  Patient presents with  . Recurrent UTI    HPI Sara Tran is a 62 y.o. female.  Patient is a 62 year old female who presents to the emergency department with a complaint that she is having urinary tract infection symptoms, as well as upper respiratory infection symptoms.  The patient states that she has recurrent urinary tract infections.  She has had problems with urosepsis, required stents, required lithotripsy, and has had various other procedures involving her renal system the patient states that over the last week she is been having lower abdomen pain, flank area pain.  She has some pain in the bladder area.  She says that she has to go more frequently than usual and when she does go to urinate she only has a small amount.  She says she feels as though she is having some low-grade temperature changes.  She reports her baseline is about 97.5, and she is been up as high as 98.9.  She has not had vomiting, but has had some fatigue and general malaise recently.  The patient also feels as though she is having an upper respiratory infection as she has had some nasal congestion.  She is had some soreness and scratchiness of her throat.  The patient reports a brown to yellow phlegm at times when she coughs or blows her nose.  She is also complaining of some burning type indigestion related to her upper respiratory infection.  No hemoptysis reported.  No unusual chills.  No unusual rash.  She presents now for assistance with these issues.  It is of note that she spoke with Dr. Jeffie Pollock.  He asked her to come to the emergency department for evaluation given her past history.     Past Medical History:  Diagnosis Date  . Adnexal cyst    Right simple cyst seen on [place]  . Anginal pain ([place])   . Anxiety   . Arthritis   . Asthma   . Atrial  fibrillation (HCC)    hx of   . CHF (congestive heart failure) ([place])   . Chronic back pain   . Chronic hip pain   . COPD (chronic obstructive pulmonary disease) ([place])   . Coronary artery disease    stents placed   . Dyspnea   . GERD (gastroesophageal reflux disease)   . Headache    due to pinched nerve damaage   . Heart murmur    hx of slight murmur   . Herpes simplex virus (HSV) infection   . History of bronchitis   . History of cardiac catheterization    Normal coronaries 2013  . History of kidney stones   . Hot flashes   . Hypertension   . Hypertriglyceridemia   . IBS (irritable bowel syndrome)   . Internal hemorrhoids   . Morbid obesity ([place])    s/p lap band surgery  . Myocardial infarction ([place])    times 3  . Obesity   . Pulmonary HTN ([place])    [place] 2008:  RA pressures 13/10 with a mean of 7 mmHg.  RV pressure 08/65 with end diastolic pressure of 15 mmHg.  PA pressure 49/23 with a mean of 35 mmHg.  Pulmonary capillary wedge 17/50 with a mean of 14 mmHg. The PA saturation was 68%.  RA saturation was 71% and aortic saturation was  90%.  Cardiac output was 6.0 with a cardiac index of 2.80 by Fick.  Normal coronaries by cath 2008.  Marland Kitchen Renal insufficiency   . Sciatica of right side   . Sleep apnea    CPAP use does not know settings   . Type 2 diabetes mellitus ([place])    type II   . Uterine fibroid     Patient Active Problem List   Diagnosis Date Noted  . Levoscoliosis 11/20/2017  . Staghorn kidney stones 08/08/2017  . GI bleed 05/29/2017  . Sepsis due to urinary tract infection ([place]) 05/29/2017  . UTI (urinary tract infection) 05/29/2017  . Cervicalgia 11/21/2016  . Chronic pain syndrome 10/11/2015  . Lumbar radiculopathy 07/25/2015  . Fibroid 10/13/2013  . Adnexal cyst 10/13/2013  . PMB (postmenopausal bleeding) 10/06/2013  . Unspecified symptom associated with female genital organs 10/06/2013  . Hot flashes 10/06/2013  . Internal hemorrhoids with other complication  85/02/7740  . 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 Hip pain 04/09/2013  . Hemorrhoids, internal 03/12/2013  . Back pain 03/11/2013  . Encounter for therapeutic drug monitoring 03/11/2013  . Trochanteric bursitis 03/11/2013  . Lumbar facet arthropathy 03/11/2013  . Unstable angina ([place]) 06/02/2012  . Acute on chronic renal insufficiency 06/02/2012  . Chest pain 04/22/2012  . IBS (irritable bowel syndrome)-DIARRHEA 01/23/2012  . Morbid obesity ([place]) 08/09/2011  . Diabetes mellitus ([place]) 08/09/2011  . DIARRHEA 10/11/2010  . Chronic diastolic heart failure ([place]) 12/16/2007  . Pulmonary hypertension ([place]) 12/12/2007  . OSTEOARTHRITIS 12/11/2007  . SLEEP APNEA 12/11/2007    Past Surgical History:  Procedure Laterality Date  . APPENDECTOMY    . CARDIAC CATHETERIZATION  07/2007, 05/2012   Normal coronary arteries  . COLONOSCOPY  10/18/10   SLF:2-mm sessile sigmoid polyp/diverticula  . CYSTOSCOPY/URETEROSCOPY/HOLMIUM LASER/STENT PLACEMENT Right 08/01/2017   Procedure: CYSTOSCOPY/ RIGHT URETEROSCOPY/ RIGHT RETROGRADE/ HOLMIUM LASER  AND RIGHT STENT PLACEMENT FIRST STAGE;  Surgeon: Irine Seal, MD;  Location: [facility];  Service: Urology;  Laterality: Right;  . CYSTOSCOPY/URETEROSCOPY/HOLMIUM LASER/STENT PLACEMENT Right 08/08/2017   Procedure: RIGHT URETEROSCOPY WITH HOLMIUM LASER RIGHT STENT PLACEMENT;  Surgeon: Irine Seal, MD;  Location: [facility];  Service: Urology;  Laterality: Right;  . EXTRACORPOREAL SHOCK WAVE LITHOTRIPSY Right 08/22/2017   Procedure: RIGHT EXTRACORPOREAL SHOCK WAVE LITHOTRIPSY (ESWL);  Surgeon: Alexis Frock, MD;  Location: [facility];  Service: Urology;  Laterality: Right;  . HEMORRHOID BANDING  07/2012   Dr. Oneida Alar  . LAPAROSCOPIC CHOLECYSTECTOMY  1985  . LAPAROSCOPIC GASTRIC BANDING  12/12/2010  . LEFT AND RIGHT HEART CATHETERIZATION WITH CORONARY ANGIOGRAM N/A 06/03/2012   Procedure: LEFT AND RIGHT HEART CATHETERIZATION WITH CORONARY ANGIOGRAM;  Surgeon: Jolaine Artist, MD;  Location: [facility] CATH LAB;  Service: Cardiovascular;  Laterality: N/A;  . PILONIDAL CYST EXCISION  1974  . 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 TUMOR EXCISION  10/2011   Face     OB History    Gravida  0   Para  0   Term  0   Preterm  0   AB  0   Living  0     SAB  0   TAB  0   Ectopic  0   Multiple  0   Live Births               Home Medications    Prior to Admission medications   Medication Sig Start Date End Date Taking? Authorizing Provider  albuterol (PROVENTIL  HFA;VENTOLIN HFA) 108 (90 BASE) MCG/ACT inhaler Inhale 2 puffs into the lungs every 6 (six) hours as needed. FOR SHORTNESS OF BREATH/WHEEZING    [provider]  albuterol (PROVENTIL) (2.5 MG/3ML) 0.083% nebulizer solution Take 2.5 mg by nebulization every 4 (four) hours as needed for wheezing or shortness of breath.  09/19/15   [provider]  ALPRAZolam Duanne Moron) 0.5 MG tablet Take 0.5 mg by mouth 3 (three) times daily as needed. For anxiety/sleep 11/24/14   [provider]  amLODipine (NORVASC) 10 MG tablet Take 1 tablet by mouth every evening.  04/10/13   [provider]  budesonide (PULMICORT) 180 MCG/ACT inhaler Inhale 2 puffs into the lungs 2 (two) times daily.    [provider]  carisoprodol (SOMA) 350 MG tablet TAKE 1 TABLET FOUR TIMES DAILY AS NEEDED  FOR  MUSCLE  SPASM 10/01/17   Meredith Staggers, MD  Cholecalciferol (VITAMIN D3 PO) Take 1 capsule by mouth daily.    [provider]  Dulaglutide (TRULICITY) 1.75 ZW/2.5EN SOPN Inject 0.75 mg into the skin once a week. 01/17/18   Renato Shin, MD  fenofibrate 160 MG tablet Take 160 mg by mouth daily. 07/21/15   [provider]  fluticasone (FLONASE) 50 MCG/ACT nasal spray Place 1-2 sprays into the nose daily.     [provider]  gabapentin (NEURONTIN) 300 MG capsule Take 300 mg by mouth 3 (three) times daily.    [provider]  ipratropium (ATROVENT)  0.02 % nebulizer solution Take 0.5 mg by nebulization every 6 (six) hours as needed.  09/19/15   [provider]  isosorbide mononitrate (IMDUR) 30 MG 24 hr tablet Take 30 mg by mouth every morning.  09/05/11   [provider]  loratadine (CLARITIN) 10 MG tablet Take 1 tablet by mouth daily. 05/27/17   [provider]  losartan (COZAAR) 50 MG tablet Take 1 tablet (50 mg total) by mouth daily. 10/28/17 01/26/18  Bensimhon, Shaune Pascal, MD  magnesium oxide (MAG-OX) 400 MG tablet Take 400 mg by mouth daily.    [provider]  metFORMIN (GLUCOPHAGE-XR) 500 MG 24 hr tablet Take 1 tablet by mouth daily. 05/20/17   [provider]  Multiple Vitamins-Minerals (ONE-A-DAY 50 PLUS PO) Take 1 tablet by mouth daily.    [provider]  nitroGLYCERIN (NITROSTAT) 0.4 MG SL tablet Place 0.4 mg under the tongue every 5 (five) minutes as needed. Reported on 06/14/2016    [provider]  omega-3 acid ethyl esters (LOVAZA) 1 g capsule Take by mouth 2 (two) times daily.    [provider]  potassium chloride SA (K-DUR,KLOR-CON) 20 MEQ tablet Take 20 mEq by mouth every morning.     [provider]  tiZANidine (ZANAFLEX) 4 MG tablet Take 4 mg by mouth every 8 (eight) hours as needed for muscle spasms.    [provider]    Family History Family History  Problem Relation Age of Onset  . Diabetes Mother   . Heart failure Mother   . Breast cancer Sister   . CAD Father   . Hypertension Father   . Cancer Paternal Grandfather   . Hypertension Paternal Grandmother   . Stroke Paternal Grandmother   . Other Maternal Grandmother        tonsilitis  . Diabetes Maternal Grandfather   . Hypertension Maternal Grandfather   . Breast cancer Paternal Aunt     Social History Social History   Tobacco Use  .  Smoking status: Former Smoker    Types: Cigarettes    Last attempt to quit: 08/08/1996    Years since quitting: 21.8  . Smokeless  tobacco: Never Used  Substance Use Topics  . Alcohol use: No  . Drug use: No     Allergies   Bee venom; Ancef [cefazolin]; Aspirin; Ceftin [cefuroxime axetil]; Ciprofloxacin; Macrodantin [nitrofurantoin]; Milk-related compounds; Other; and Latex   Review of Systems Review of Systems  Constitutional: Positive for fatigue. Negative for activity change.       All ROS Neg except as noted in HPI  HENT: Positive for congestion, postnasal drip, sinus pressure and sore throat. Negative for nosebleeds and trouble swallowing.   Eyes: Negative for photophobia and discharge.  Respiratory: Negative for cough, shortness of breath and wheezing.   Cardiovascular: Negative for chest pain and palpitations.  Gastrointestinal: Negative for abdominal pain and blood in stool.  Genitourinary: Positive for dysuria, flank pain and frequency. Negative for hematuria.  Musculoskeletal: Negative for arthralgias, back pain and neck pain.  Skin: Negative.   Neurological: Negative for dizziness, seizures and speech difficulty.  Psychiatric/Behavioral: Negative for confusion and hallucinations.     Physical Exam Updated Vital Signs BP (!) 152/74 (BP Location: Right Arm)   Pulse 90   Temp 98.2 F (36.8 C) (Oral)   LMP 06/06/2013   SpO2 96%   Physical Exam  Constitutional: She is oriented to person, place, and time. She appears well-developed and well-nourished.  Non-toxic appearance.  HENT:  Head: Normocephalic.  Right Ear: Tympanic membrane and external ear normal.  Left Ear: Tympanic membrane and external ear normal.  Mild nasal congestion present. Airway patent.  Eyes: Pupils are equal, round, and reactive to light. EOM and lids are normal.  Neck: Normal range of motion. Neck supple. Carotid bruit is not present.  Cardiovascular: Normal rate, regular rhythm, normal heart sounds, intact distal pulses and normal pulses.  Pulmonary/Chest: Breath sounds normal. No respiratory distress.  Abdominal:  Soft. Bowel sounds are normal. There is tenderness. There is no guarding.  Right flank tenderness to palpation.  Musculoskeletal: Normal range of motion.  Lymphadenopathy:       Head (right side): No submandibular adenopathy present.       Head (left side): No submandibular adenopathy present.    She has no cervical adenopathy.  Neurological: She is alert and oriented to person, place, and time. She has normal strength. No cranial nerve deficit or sensory deficit.  Skin: Skin is warm and dry.  Psychiatric: She has a normal mood and affect. Her speech is normal.  Nursing note and vitals reviewed.    ED Treatments / Results  Labs (all labs ordered are listed, but only abnormal results are displayed) Labs Reviewed  URINALYSIS, ROUTINE W REFLEX MICROSCOPIC - Abnormal; Notable for the following components:      Result Value   APPearance HAZY (*)    Protein, ur 100 (*)    Leukocytes, UA TRACE (*)    Bacteria, UA MANY (*)    All other components within normal limits  URINE CULTURE    EKG None  Radiology No results found.  Procedures Procedures (including critical care time)  Medications Ordered in ED Medications - No data to display   Initial Impression / Assessment and Plan / ED Course  I have reviewed the triage vital signs and the nursing notes.  Pertinent labs & imaging results that were available during my care of the patient were reviewed by me and considered in  my medical decision making (see chart for details).       Final Clinical Impressions(s) / ED Diagnoses MDM Vital signs reviewed.  Pulse oximetry is 96% on room air.  Within normal limits by my interpretation.  Patient has mild nasal congestion present, there is no increase swelling about the tonsils or any unusual exudate.  The airway is patent.  Suspect that the patient has a resolving upper respiratory infection.  The patient has a history of urosepsis, kidney stones, requiring ureteral stents, and  other renal procedures.  The urine reveals evidence of urinary tract infection.  The patient states she is having frequency and urgency.  We will culture the urine.   will obtain a stone study.  The CT stone study shows no acute findings within the abdomen or pelvis.  There is no ureteral stone and no obstructive uropathy noted.  I discussed these findings with the patient in terms which he understands.  Patient urine has been sent to the lab for culture.  Patient placed on antibiotics.  Patient is to follow-up with Dr. Jeffie Pollock as soon as possible.   Final diagnoses:  Urinary tract infection without hematuria, site unspecified    ED Discharge Orders        Ordered    amoxicillin-clavulanate (AUGMENTIN) 875-125 MG tablet  Every 12 hours     06/20/18 2054       Lily Kocher, PA-C 06/22/18 1633    Mesner, Corene Cornea, MD 06/22/18 [phone]

## 2018-06-24 LAB — URINE CULTURE
Culture: >=100000 — AB
Special Requests: NORMAL

## 2018-06-25 ENCOUNTER — Telehealth: Payer: Self-pay | Admitting: *Deleted

## 2018-06-25 NOTE — Telephone Encounter (Signed)
Post ED Visit - Positive Culture Follow-up  Culture report reviewed by antimicrobial stewardship pharmacist:  []  Elenor Quinones, Pharm.D. []  Heide Guile, Pharm.D., BCPS AQ-ID []  Parks Neptune, Pharm.D., BCPS []  Alycia Rossetti, Pharm.D., BCPS []  Sobieski, Pharm.D., BCPS, AAHIVP []  Legrand Como, Pharm.D., BCPS, AAHIVP []  Salome Arnt, PharmD, BCPS []  Wynell Balloon, PharmD []  Vincenza Hews, PharmD, BCPS Angus Seller, PharmD  Positive urine culture Treated with Amoxicillin-Pot Clavulanate, organism sensitive to the same and no further patient follow-up is required at this time.  Harlon Flor [facility] 06/25/2018, 10:31 AM

## 2018-07-01 DIAGNOSIS — R946 Abnormal results of thyroid function studies: Secondary | ICD-10-CM | POA: Diagnosis not present

## 2018-07-01 DIAGNOSIS — E119 Type 2 diabetes mellitus without complications: Secondary | ICD-10-CM | POA: Diagnosis not present

## 2018-07-01 DIAGNOSIS — E782 Mixed hyperlipidemia: Secondary | ICD-10-CM | POA: Diagnosis not present

## 2018-07-01 DIAGNOSIS — Z0000 Encounter for general adult medical examination without abnormal findings: Secondary | ICD-10-CM | POA: Diagnosis not present

## 2018-07-01 DIAGNOSIS — G4733 Obstructive sleep apnea (adult) (pediatric): Secondary | ICD-10-CM | POA: Diagnosis not present

## 2018-07-01 DIAGNOSIS — I200 Unstable angina: Secondary | ICD-10-CM | POA: Diagnosis not present

## 2018-07-01 DIAGNOSIS — Z1389 Encounter for screening for other disorder: Secondary | ICD-10-CM | POA: Diagnosis not present

## 2018-07-01 DIAGNOSIS — N183 Chronic kidney disease, stage 3 (moderate): Secondary | ICD-10-CM | POA: Diagnosis not present

## 2018-07-01 DIAGNOSIS — E785 Hyperlipidemia, unspecified: Secondary | ICD-10-CM | POA: Diagnosis not present

## 2018-07-01 DIAGNOSIS — Z6841 Body Mass Index (BMI) 40.0 and over, adult: Secondary | ICD-10-CM | POA: Diagnosis not present

## 2018-07-01 DIAGNOSIS — D519 Vitamin B12 deficiency anemia, unspecified: Secondary | ICD-10-CM | POA: Diagnosis not present

## 2018-07-01 DIAGNOSIS — Z9884 Bariatric surgery status: Secondary | ICD-10-CM | POA: Diagnosis not present

## 2018-07-11 ENCOUNTER — Ambulatory Visit: Payer: Medicare HMO | Admitting: Urology

## 2018-07-11 DIAGNOSIS — N390 Urinary tract infection, site not specified: Secondary | ICD-10-CM

## 2018-07-11 DIAGNOSIS — N200 Calculus of kidney: Secondary | ICD-10-CM | POA: Diagnosis not present

## 2018-08-01 ENCOUNTER — Telehealth: Payer: Self-pay | Admitting: Endocrinology

## 2018-08-01 NOTE — Telephone Encounter (Signed)
Dulaglutide (TRULICITY) 9.92 TS/0.0SY SOPN  Patient stated that he medication was suppose to be dropped off at the office and patient stated she has not heard from anyone about this, Please advise

## 2018-08-04 NOTE — Telephone Encounter (Signed)
I have called patient but had to LVM. I stated that I had not received anything for patient's Trulicity. The last I saw in chart in regards to [facility] was February. I wanted to make sure she had talked to them & was approved.

## 2018-08-11 DIAGNOSIS — Z1231 Encounter for screening mammogram for malignant neoplasm of breast: Secondary | ICD-10-CM | POA: Diagnosis not present

## 2018-08-13 ENCOUNTER — Encounter: Payer: Medicare HMO | Attending: Registered Nurse | Admitting: Physical Medicine & Rehabilitation

## 2018-08-13 ENCOUNTER — Encounter: Payer: Self-pay | Admitting: Physical Medicine & Rehabilitation

## 2018-08-13 VITALS — BP 160/81 | HR 92 | Ht 59.0 in | Wt 273.0 lb

## 2018-08-13 DIAGNOSIS — I272 Pulmonary hypertension, unspecified: Secondary | ICD-10-CM | POA: Insufficient documentation

## 2018-08-13 DIAGNOSIS — E781 Pure hyperglyceridemia: Secondary | ICD-10-CM | POA: Insufficient documentation

## 2018-08-13 DIAGNOSIS — J449 Chronic obstructive pulmonary disease, unspecified: Secondary | ICD-10-CM | POA: Diagnosis not present

## 2018-08-13 DIAGNOSIS — Z6841 Body Mass Index (BMI) 40.0 and over, adult: Secondary | ICD-10-CM | POA: Insufficient documentation

## 2018-08-13 DIAGNOSIS — Z8249 Family history of ischemic heart disease and other diseases of the circulatory system: Secondary | ICD-10-CM | POA: Insufficient documentation

## 2018-08-13 DIAGNOSIS — Z9049 Acquired absence of other specified parts of digestive tract: Secondary | ICD-10-CM | POA: Insufficient documentation

## 2018-08-13 DIAGNOSIS — I4891 Unspecified atrial fibrillation: Secondary | ICD-10-CM | POA: Insufficient documentation

## 2018-08-13 DIAGNOSIS — M7060 Trochanteric bursitis, unspecified hip: Secondary | ICD-10-CM | POA: Diagnosis not present

## 2018-08-13 DIAGNOSIS — M47816 Spondylosis without myelopathy or radiculopathy, lumbar region: Secondary | ICD-10-CM | POA: Diagnosis not present

## 2018-08-13 DIAGNOSIS — I110 Hypertensive heart disease with heart failure: Secondary | ICD-10-CM | POA: Insufficient documentation

## 2018-08-13 DIAGNOSIS — K589 Irritable bowel syndrome without diarrhea: Secondary | ICD-10-CM | POA: Diagnosis not present

## 2018-08-13 DIAGNOSIS — Z955 Presence of coronary angioplasty implant and graft: Secondary | ICD-10-CM | POA: Insufficient documentation

## 2018-08-13 DIAGNOSIS — Z9884 Bariatric surgery status: Secondary | ICD-10-CM | POA: Insufficient documentation

## 2018-08-13 DIAGNOSIS — I252 Old myocardial infarction: Secondary | ICD-10-CM | POA: Diagnosis not present

## 2018-08-13 DIAGNOSIS — M7631 Iliotibial band syndrome, right leg: Secondary | ICD-10-CM | POA: Insufficient documentation

## 2018-08-13 DIAGNOSIS — I509 Heart failure, unspecified: Secondary | ICD-10-CM | POA: Diagnosis not present

## 2018-08-13 DIAGNOSIS — M419 Scoliosis, unspecified: Secondary | ICD-10-CM | POA: Diagnosis not present

## 2018-08-13 DIAGNOSIS — M7061 Trochanteric bursitis, right hip: Secondary | ICD-10-CM | POA: Diagnosis not present

## 2018-08-13 DIAGNOSIS — N289 Disorder of kidney and ureter, unspecified: Secondary | ICD-10-CM | POA: Insufficient documentation

## 2018-08-13 DIAGNOSIS — E119 Type 2 diabetes mellitus without complications: Secondary | ICD-10-CM | POA: Diagnosis not present

## 2018-08-13 DIAGNOSIS — M4316 Spondylolisthesis, lumbar region: Secondary | ICD-10-CM | POA: Insufficient documentation

## 2018-08-13 DIAGNOSIS — Z87440 Personal history of urinary (tract) infections: Secondary | ICD-10-CM | POA: Insufficient documentation

## 2018-08-13 DIAGNOSIS — M545 Low back pain: Secondary | ICD-10-CM | POA: Diagnosis not present

## 2018-08-13 DIAGNOSIS — M7632 Iliotibial band syndrome, left leg: Secondary | ICD-10-CM | POA: Diagnosis not present

## 2018-08-13 DIAGNOSIS — I2510 Atherosclerotic heart disease of native coronary artery without angina pectoris: Secondary | ICD-10-CM | POA: Insufficient documentation

## 2018-08-13 DIAGNOSIS — K219 Gastro-esophageal reflux disease without esophagitis: Secondary | ICD-10-CM | POA: Diagnosis not present

## 2018-08-13 DIAGNOSIS — Z87891 Personal history of nicotine dependence: Secondary | ICD-10-CM | POA: Insufficient documentation

## 2018-08-13 DIAGNOSIS — M7062 Trochanteric bursitis, left hip: Secondary | ICD-10-CM | POA: Diagnosis not present

## 2018-08-13 DIAGNOSIS — G4730 Sleep apnea, unspecified: Secondary | ICD-10-CM | POA: Diagnosis not present

## 2018-08-13 DIAGNOSIS — F419 Anxiety disorder, unspecified: Secondary | ICD-10-CM | POA: Insufficient documentation

## 2018-08-13 DIAGNOSIS — Z87442 Personal history of urinary calculi: Secondary | ICD-10-CM | POA: Insufficient documentation

## 2018-08-13 DIAGNOSIS — Z803 Family history of malignant neoplasm of breast: Secondary | ICD-10-CM | POA: Insufficient documentation

## 2018-08-13 DIAGNOSIS — M542 Cervicalgia: Secondary | ICD-10-CM | POA: Insufficient documentation

## 2018-08-13 DIAGNOSIS — Z86010 Personal history of colonic polyps: Secondary | ICD-10-CM | POA: Insufficient documentation

## 2018-08-13 DIAGNOSIS — M5416 Radiculopathy, lumbar region: Secondary | ICD-10-CM | POA: Diagnosis not present

## 2018-08-13 DIAGNOSIS — Z833 Family history of diabetes mellitus: Secondary | ICD-10-CM | POA: Insufficient documentation

## 2018-08-13 MED ORDER — BACLOFEN 10 MG PO TABS: 10.0000 mg | ORAL_TABLET | Freq: Three times a day (TID) | ORAL | 3 refills | Status: DC | PRN

## 2018-08-13 NOTE — Progress Notes (Signed)
Subjective:    Patient ID: Sara Tran, female    DOB: 03-06-1956, 62 y.o.   MRN: [id number]  HPI  Sara Tran is here in follow-up of her chronic pain issues.  I last saw her in November 2018. She has had many issues with her kidney stones which have required surgery. At our last visit we injected her right hip and sent her for therapies. The issues and surgeries related to her kidneys sent her backward and decreased her functional mobility.  Her hips continue to bother her. She had + results with the last injection. She tries to stretch daily. She is not getting into a pool in fear of 'bacteria' per her nephrologists recommendations???  She has seen a provider in [place] who is offering stem cell injections as an option. She wanted a referral from me to this provider whom she doesn't know the name of.  Pain Inventory Average Pain 7 Pain Right Now 7 My pain is sharp, burning, stabbing, tingling and aching  In the last 24 hours, has pain interfered with the following? General activity 0 Relation with others 0 Enjoyment of life 0 What TIME of day is your pain at its worst? night Sleep (in general) Fair  Pain is worse with: walking and some activites Pain improves with: rest and medication Relief from Meds: 9  Mobility use a cane use a walker ability to climb steps?  no do you drive?  yes  Function retired I need assistance with the following:  meal prep, household duties and shopping  Neuro/Psych weakness numbness tremor tingling trouble walking spasms dizziness  Prior Studies Any changes since last visit?  no  Physicians involved in your care Any changes since last visit?  no   Family History  Problem Relation Age of Onset  . Diabetes Mother   . Heart failure Mother   . Breast cancer Sister   . CAD Father   . Hypertension Father   . Cancer Paternal Grandfather   . Hypertension Paternal Grandmother   . Stroke Paternal Grandmother   . Other Maternal  Grandmother        tonsilitis  . Diabetes Maternal Grandfather   . Hypertension Maternal Grandfather   . Breast cancer Paternal Aunt    Social History   Socioeconomic History  . Marital status: Single    Spouse name: Not on file  . Number of children: 0  . Years of education: Not on file  . Highest education level: Not on file  Occupational History  . Occupation: Volunteers  . 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 Financial resource strain: Not on file  . Food insecurity:    Worry: Not on file    Inability: Not on file  . Transportation needs:    Medical: Not on file    Non-medical: Not on file  Tobacco Use  . Smoking status: Former Smoker    Types: Cigarettes    Last attempt to quit: 08/08/1996    Years since quitting: 22.0  . Smokeless tobacco: Never Used  Substance and Sexual Activity  . Alcohol use: No  . Drug use: No  . Sexual activity: Never    Birth control/protection: Post-menopausal  Lifestyle  . Physical activity:    Days per week: Not on file    Minutes per session: Not on file  . Stress: Not on file  Relationships  . Social connections:    Talks  on phone: Not on file    Gets together: Not on file    Attends religious service: Not on file    Active member of club or organization: Not on file    Attends meetings of clubs or organizations: Not on file    Relationship status: Not on file  Other Topics Concern  . Not on file  Social History Narrative  . Not on file   Past Surgical History:  Procedure Laterality Date  . APPENDECTOMY    . CARDIAC CATHETERIZATION  07/2007, 05/2012   Normal coronary arteries  . COLONOSCOPY  10/18/10   [facility]:2-mm sessile sigmoid polyp/diverticula  . CYSTOSCOPY/URETEROSCOPY/HOLMIUM LASER/STENT PLACEMENT Right 08/01/2017   Procedure: CYSTOSCOPY/ RIGHT URETEROSCOPY/ RIGHT RETROGRADE/ HOLMIUM LASER  AND RIGHT STENT PLACEMENT FIRST STAGE;  Surgeon: Irine Seal, MD;  Location: [facility]  ORS;  Service: Urology;  Laterality: Right;  . CYSTOSCOPY/URETEROSCOPY/HOLMIUM LASER/STENT PLACEMENT Right 08/08/2017   Procedure: RIGHT URETEROSCOPY WITH HOLMIUM LASER RIGHT STENT PLACEMENT;  Surgeon: Irine Seal, MD;  Location: [facility] ORS;  Service: Urology;  Laterality: Right;  . EXTRACORPOREAL SHOCK WAVE LITHOTRIPSY Right 08/22/2017   Procedure: RIGHT EXTRACORPOREAL SHOCK WAVE LITHOTRIPSY (ESWL);  Surgeon: Alexis Frock, MD;  Location: [facility] ORS;  Service: Urology;  Laterality: Right;  . HEMORRHOID BANDING  07/2012   Dr. Oneida Alar  . LAPAROSCOPIC CHOLECYSTECTOMY  1985  . LAPAROSCOPIC GASTRIC BANDING  12/12/2010  . LEFT AND RIGHT HEART CATHETERIZATION WITH CORONARY ANGIOGRAM N/A 06/03/2012   Procedure: LEFT AND RIGHT HEART CATHETERIZATION WITH CORONARY ANGIOGRAM;  Surgeon: Jolaine Artist, MD;  Location: [facility] CATH LAB;  Service: Cardiovascular;  Laterality: N/A;  . PILONIDAL CYST EXCISION  1974  . 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 TUMOR EXCISION  10/2011   Face   Past Medical History:  Diagnosis Date  . Adnexal cyst    Right simple cyst seen on [place]  . Anginal pain ([place])   . Anxiety   . Arthritis   . Asthma   . Atrial fibrillation (HCC)    hx of   . CHF (congestive heart failure) ([place])   . Chronic back pain   . Chronic hip pain   . COPD (chronic obstructive pulmonary disease) ([place])   . Coronary artery disease    stents placed   . Dyspnea   . GERD (gastroesophageal reflux disease)   . Headache    due to pinched nerve damaage   . Heart murmur    hx of slight murmur   . Herpes simplex virus (HSV) infection   . History of bronchitis   . History of cardiac catheterization    Normal coronaries 2013  . History of kidney stones   . Hot flashes   . Hypertension   . Hypertriglyceridemia   . IBS (irritable bowel syndrome)   . Internal hemorrhoids   . Morbid obesity ([place])    s/p lap band surgery  . Myocardial infarction ([place])    times 3  . Obesity   . Pulmonary HTN ([place])    [place]  2008:  RA pressures 13/10 with a mean of 7 mmHg.  RV pressure 43/32 with end diastolic pressure of 15 mmHg.  PA pressure 49/23 with a mean of 35 mmHg.  Pulmonary capillary wedge 17/50 with a mean of 14 mmHg. The PA saturation was 68%.  RA saturation was 71% and aortic saturation was 90%.  Cardiac output was 6.0 with a cardiac index of 2.80 by Fick.  Normal coronaries by cath 2008.  Marland Kitchen  Renal insufficiency   . Sciatica of right side   . Sleep apnea    CPAP use does not know settings   . Type 2 diabetes mellitus ([place])    type II   . Uterine fibroid    Ht 4\' 11"  (1.499 m)   Wt 273 lb (123.8 kg)   LMP 06/06/2013   BMI 55.14 kg/m   Opioid Risk Score:   Fall Risk Score:  `1  Depression screen PHQ 2/9  Depression screen [facility] 2/9 01/17/2016 07/25/2015 07/25/2015  Decreased Interest 0 0 0  Down, Depressed, Hopeless 0 0 0  PHQ - 2 Score 0 0 0  Altered sleeping - 2 -  Tired, decreased energy - 2 -  Change in appetite - 1 -  Feeling bad or failure about yourself  - 0 -  Trouble concentrating - 0 -  Moving slowly or fidgety/restless - 0 -  Suicidal thoughts - 0 -  PHQ-9 Score - 5 -     Review of Systems  Constitutional: Negative.   HENT: Negative.   Eyes: Negative.   Respiratory: Negative.   Cardiovascular: Negative.   Gastrointestinal: Negative.   Endocrine: Negative.   Genitourinary: Negative.   Musculoskeletal: Positive for arthralgias, back pain, gait problem, myalgias and neck pain.  Skin: Positive for rash.  Allergic/Immunologic: Negative.   Neurological: Positive for dizziness, tremors, weakness and numbness.  Hematological: Negative.   Psychiatric/Behavioral: Negative.   All other systems reviewed and are negative.      Objective:   Physical Exam  General: No acute distress HEENT: EOMI, oral membranes moist Cards: reg rate  Chest: normal effort Abdomen: Soft, NT, ND Skin: dry, intact Extremities: no edema  Cranial nerves are grossly intact.  Musc:greater troch  tender bilaterally. ITB's tight. Wide based gait. Limited low back ROM Neurological: Motor exam is 5 out of 5.  She has some inconsistent sensory findings in both legs.  Cognitively she is intact .    Assessment & Plan:  1. Lumbago with known facet arthropathy/L4-5 anterolisthesis, pain with lumbar extension May have some mild root involvement, the some numbness in the right lower extremity without obvious weakness. Dextroscoliosis and maladaptive pelvic posture 2. Bilateral ight trochanteric bursitis/iliotibial band syndrome, right more than left.  3. ?Right L3 radiculopathy by history and recent MRI  4. Morbid obesity  5. ?FMS  6. Cervicalgia 7. Recent admission for urosepsis. Recurrent UTI/kidney stones   Plan:  1. Gabapentin300mg TID---needs to take as scheduled.Cymbalta? 2.Needs to re-dedicate to HEP. She is very tight in core, low back, legs, ITB exercises provided today 3.After informed consent and preparation of the skin with betadine and isopropyl alcohol, I injected 6mg  (1cc) of celestone and 4cc of 1% lidocaine into the bilateral greater troch via lateral approach. Additionally, aspiration was performed prior to injection. The patient tolerated well, and no complications were encountered. Afterward the area was cleaned and dressed. Post- injection instructions were provided.  .  4. Add baclofen for spasms 10mg  TID prn 5. continue tizanidine also    6. 44minutes of face to face patient care time were spent during this visit. All questions were encouraged and answered. 3 month follow up with NP

## 2018-08-13 NOTE — Patient Instructions (Signed)
PLEASE FEEL FREE TO CALL OUR OFFICE WITH ANY PROBLEMS OR QUESTIONS ([phone])      

## 2018-08-21 IMAGING — DX DG CHEST 2V
2 series · 2 of 2 positions shown · non-contrast
Comparison: February 01, 2017

CLINICAL DATA: Chest pain for 2 weeks

EXAM:
CHEST  2 VIEW

[chest pa]
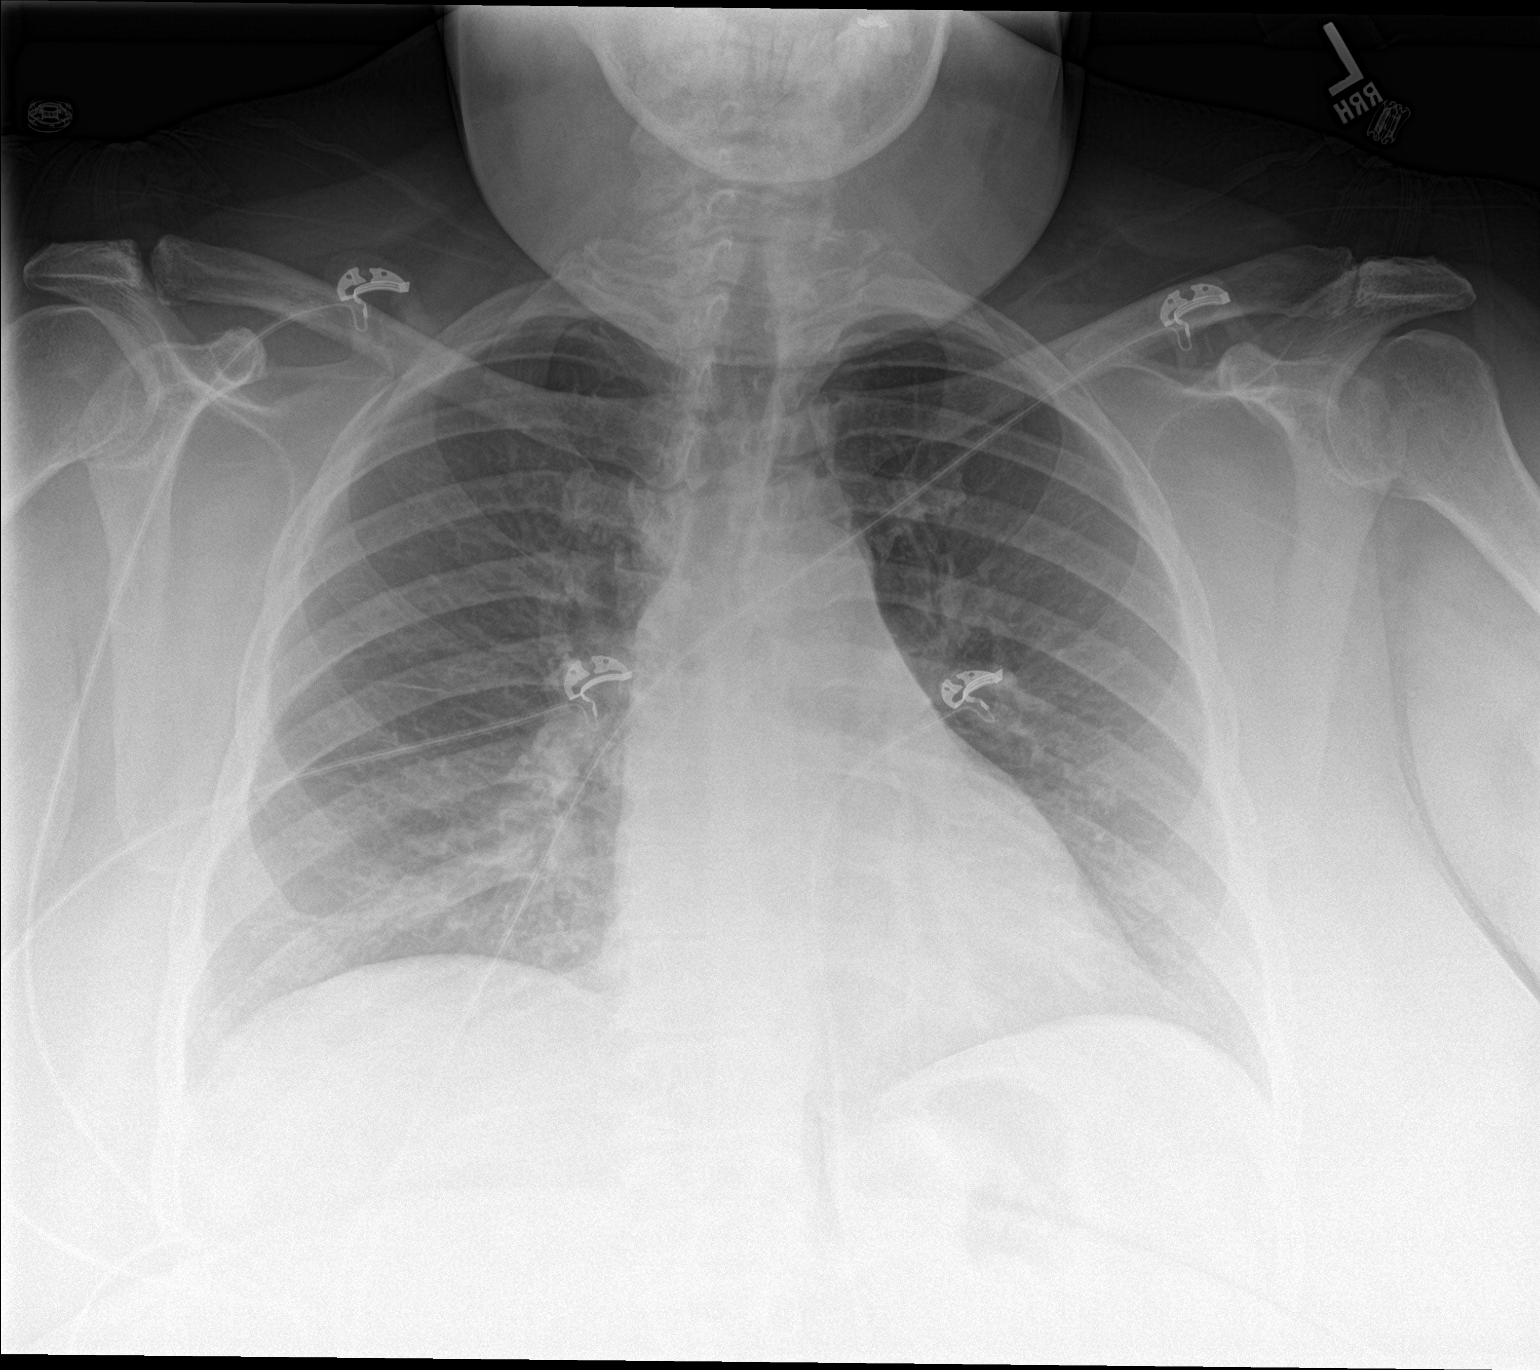

[chest lat]
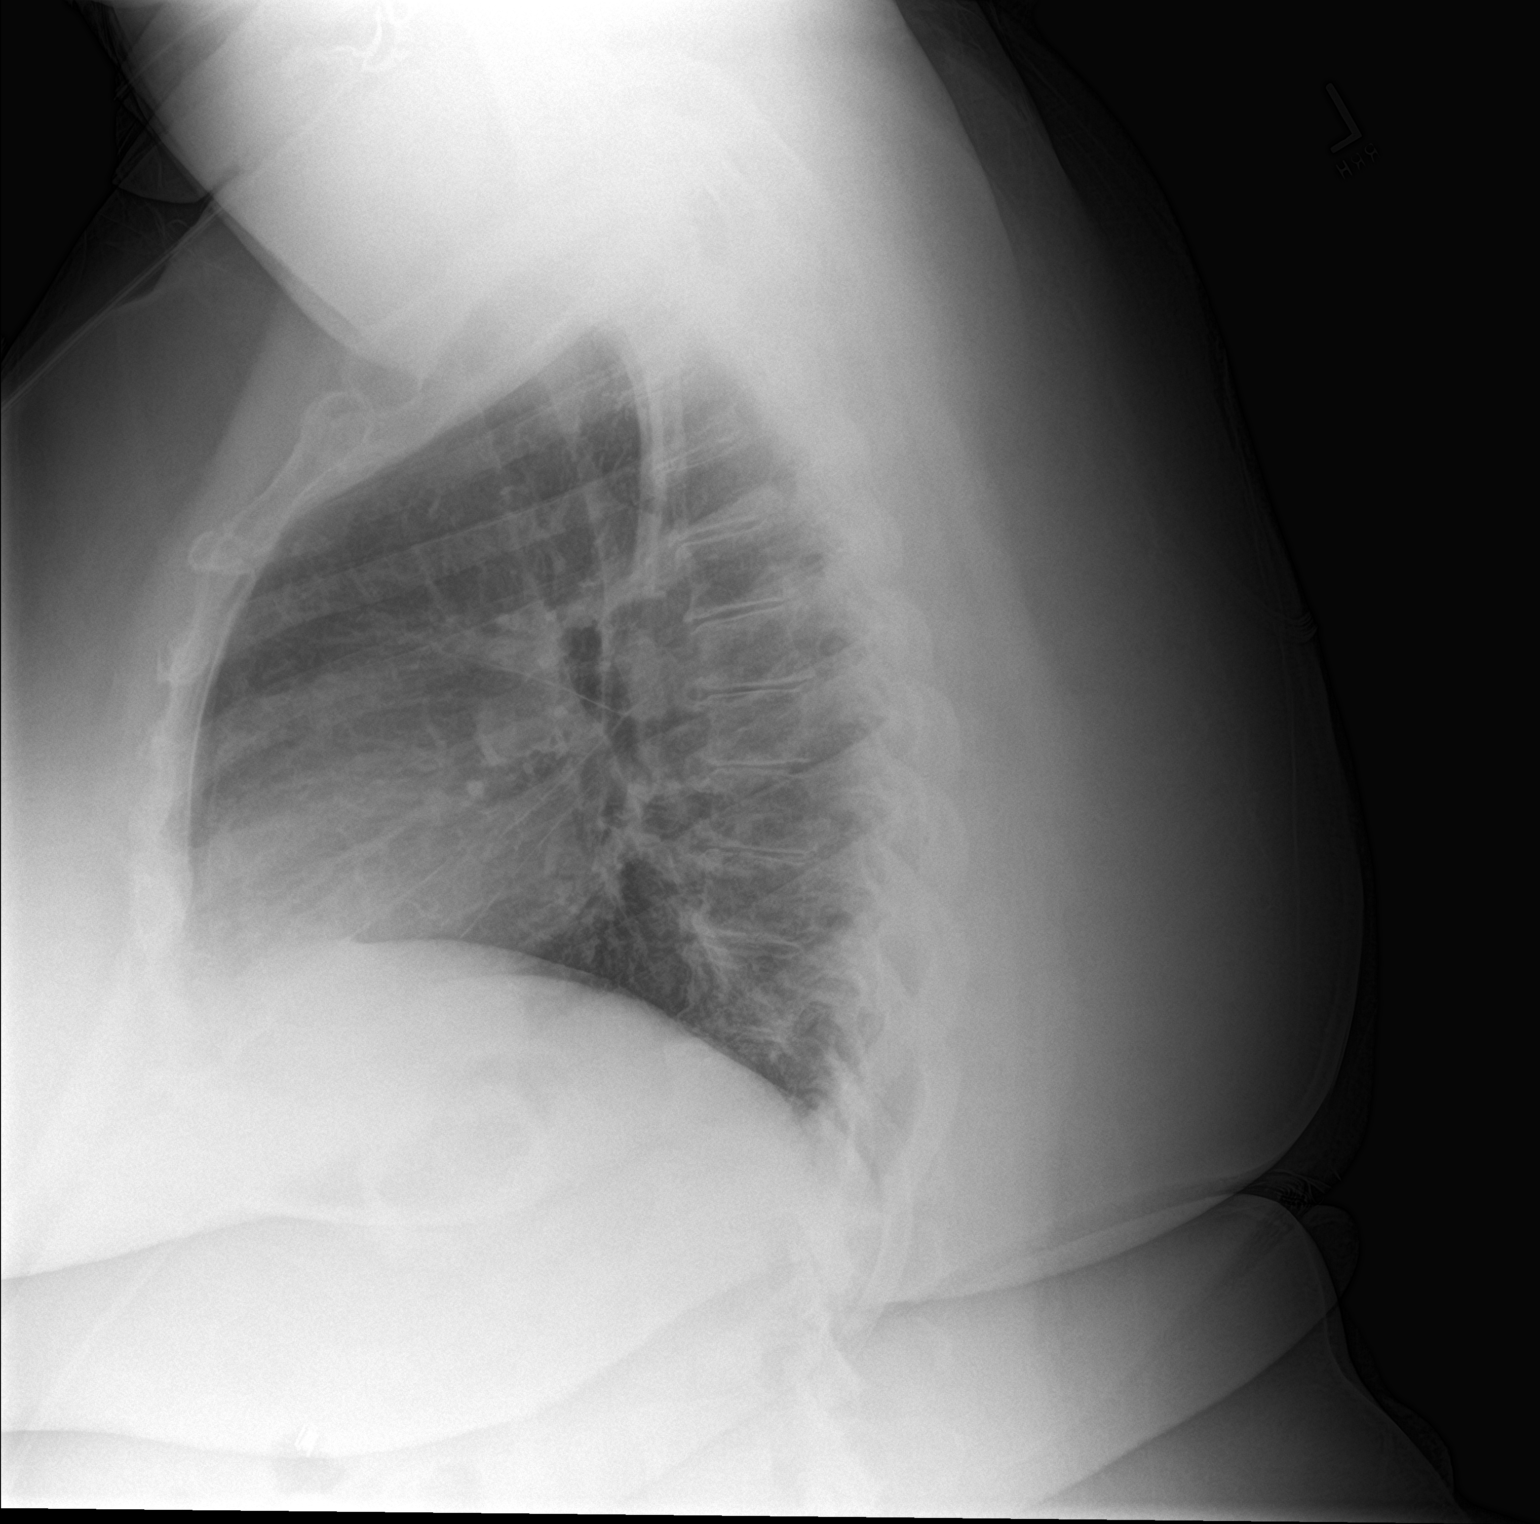

[2 of 2 positions shown; findings below may reference images not displayed]

FINDINGS: Lungs are clear. Heart size and pulmonary vascularity are normal. No
pneumothorax. No adenopathy. There is mild degenerative change in
the thoracic spine. There is a lap band at the gastric cardia level.
IMPRESSION: No edema or consolidation.

## 2018-08-29 DIAGNOSIS — G894 Chronic pain syndrome: Secondary | ICD-10-CM | POA: Diagnosis not present

## 2018-08-29 DIAGNOSIS — Z6841 Body Mass Index (BMI) 40.0 and over, adult: Secondary | ICD-10-CM | POA: Diagnosis not present

## 2018-09-11 ENCOUNTER — Encounter: Payer: Medicare HMO | Admitting: Registered Nurse

## 2018-09-15 DIAGNOSIS — G4733 Obstructive sleep apnea (adult) (pediatric): Secondary | ICD-10-CM | POA: Diagnosis not present

## 2018-09-22 ENCOUNTER — Telehealth: Payer: Self-pay

## 2018-09-22 NOTE — Telephone Encounter (Signed)
Pt called to schedule an appointment with [facility] for a TCS. Please schedule. Pt can be reached at [phone].

## 2018-09-22 NOTE — Telephone Encounter (Signed)
Called and l/m on her cell, patient chart states she is not due for tcs until 2021

## 2018-10-10 ENCOUNTER — Ambulatory Visit: Payer: Medicare HMO | Admitting: Urology

## 2018-10-10 ENCOUNTER — Other Ambulatory Visit (HOSPITAL_COMMUNITY)
Admission: AD | Admit: 2018-10-10 | Discharge: 2018-10-10 | Disposition: A | Discharge status: Home / Self Care | Payer: Medicare HMO | Source: Skilled Nursing Facility | Attending: Urology | Admitting: Urology

## 2018-10-10 DIAGNOSIS — N3941 Urge incontinence: Secondary | ICD-10-CM

## 2018-10-10 DIAGNOSIS — N390 Urinary tract infection, site not specified: Secondary | ICD-10-CM

## 2018-10-10 DIAGNOSIS — N200 Calculus of kidney: Secondary | ICD-10-CM | POA: Diagnosis not present

## 2018-10-12 LAB — URINE CULTURE: Culture: 60000 — AB

## 2018-10-24 DIAGNOSIS — E119 Type 2 diabetes mellitus without complications: Secondary | ICD-10-CM | POA: Diagnosis not present

## 2018-10-24 DIAGNOSIS — G8929 Other chronic pain: Secondary | ICD-10-CM | POA: Diagnosis not present

## 2018-10-24 DIAGNOSIS — I10 Essential (primary) hypertension: Secondary | ICD-10-CM | POA: Diagnosis not present

## 2018-10-24 DIAGNOSIS — Z6841 Body Mass Index (BMI) 40.0 and over, adult: Secondary | ICD-10-CM | POA: Diagnosis not present

## 2018-10-24 DIAGNOSIS — E782 Mixed hyperlipidemia: Secondary | ICD-10-CM | POA: Diagnosis not present

## 2018-10-24 DIAGNOSIS — E559 Vitamin D deficiency, unspecified: Secondary | ICD-10-CM | POA: Diagnosis not present

## 2018-10-24 DIAGNOSIS — M170 Bilateral primary osteoarthritis of knee: Secondary | ICD-10-CM | POA: Diagnosis not present

## 2018-10-24 DIAGNOSIS — Z23 Encounter for immunization: Secondary | ICD-10-CM | POA: Diagnosis not present

## 2018-10-24 DIAGNOSIS — Z1389 Encounter for screening for other disorder: Secondary | ICD-10-CM | POA: Diagnosis not present

## 2018-11-01 IMAGING — DX DG CHEST 1V PORT
1 series · 1 of 1 positions shown · non-contrast
Comparison: 05/02/2017

CLINICAL DATA: Acute lower central chest pains today and
shortness-of-breath.

EXAM:
PORTABLE CHEST 1 VIEW

[chest ap]
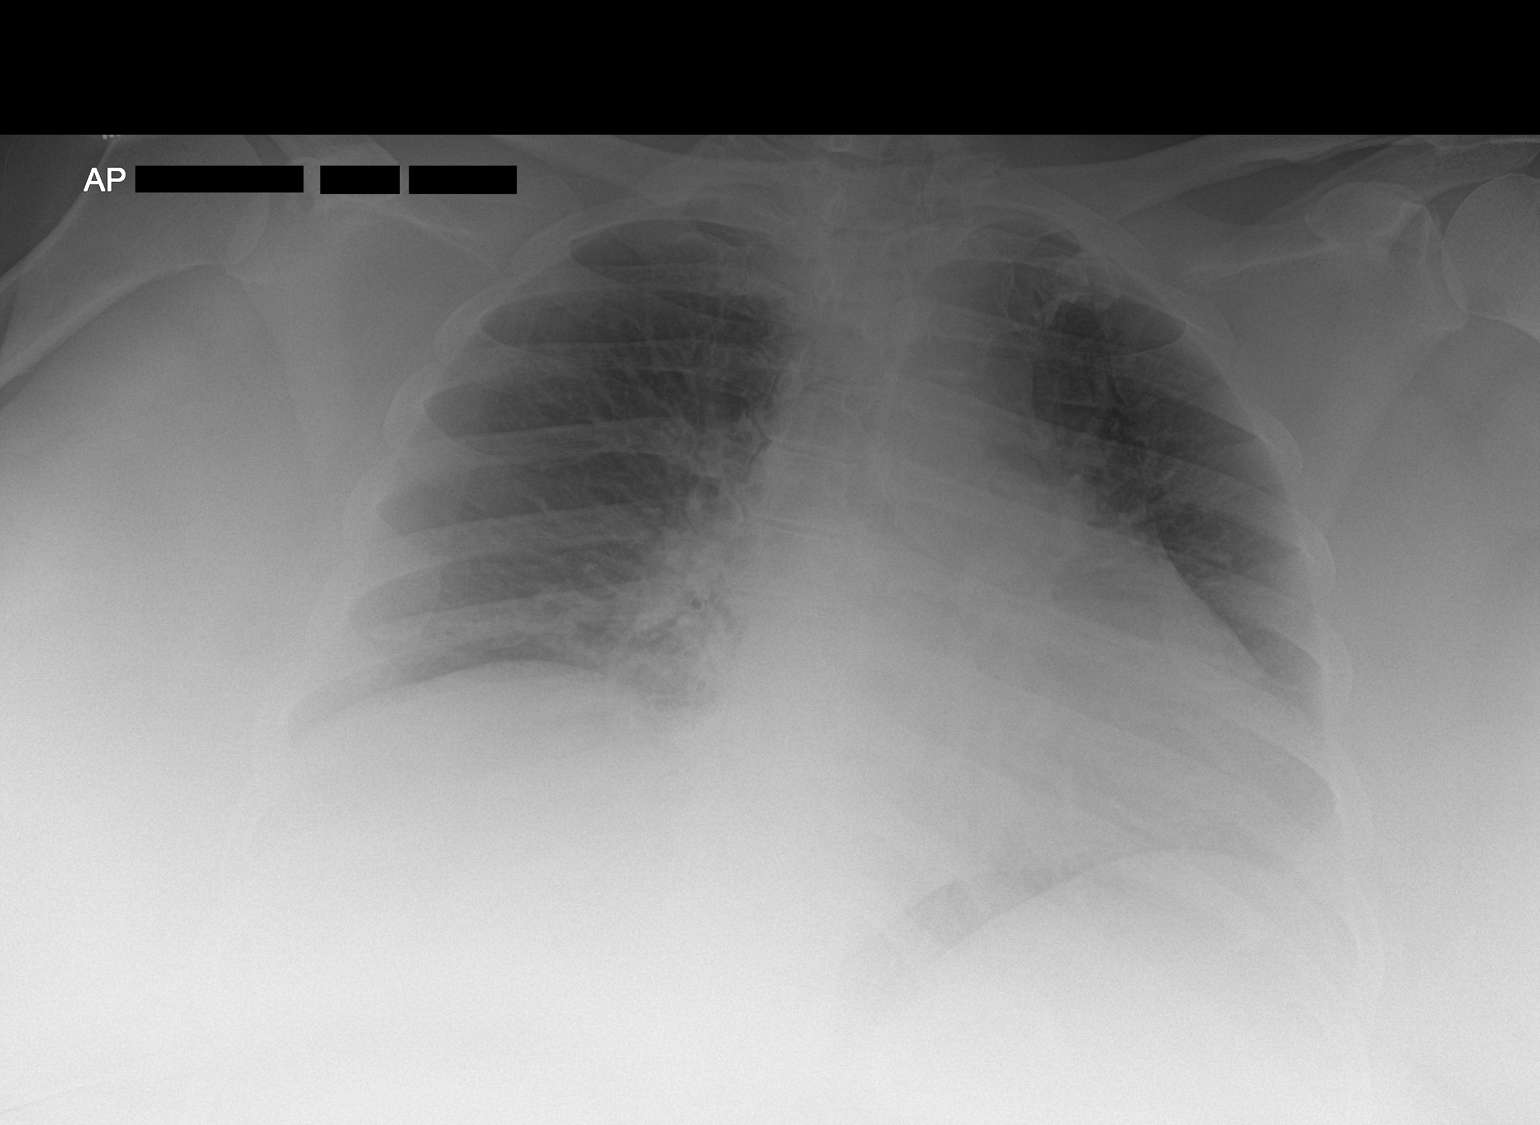

[1 of 1 positions shown; findings below may reference images not displayed]

FINDINGS: Lungs are hypoinflated with minimal prominence of the perihilar
markings suggesting mild vascular congestion. Mild stable
cardiomegaly. Remainder of the exam is unchanged.
IMPRESSION: Stable cardiomegaly with suggestion mild vascular congestion.

## 2018-11-01 IMAGING — XA DG C-ARM 61-120 MIN-NO REPORT
1 series · 1 of 1 positions shown · non-contrast
Comparison: none

[Series 1: uro standard · 1 of 1 slices shown]
[im 1/1]
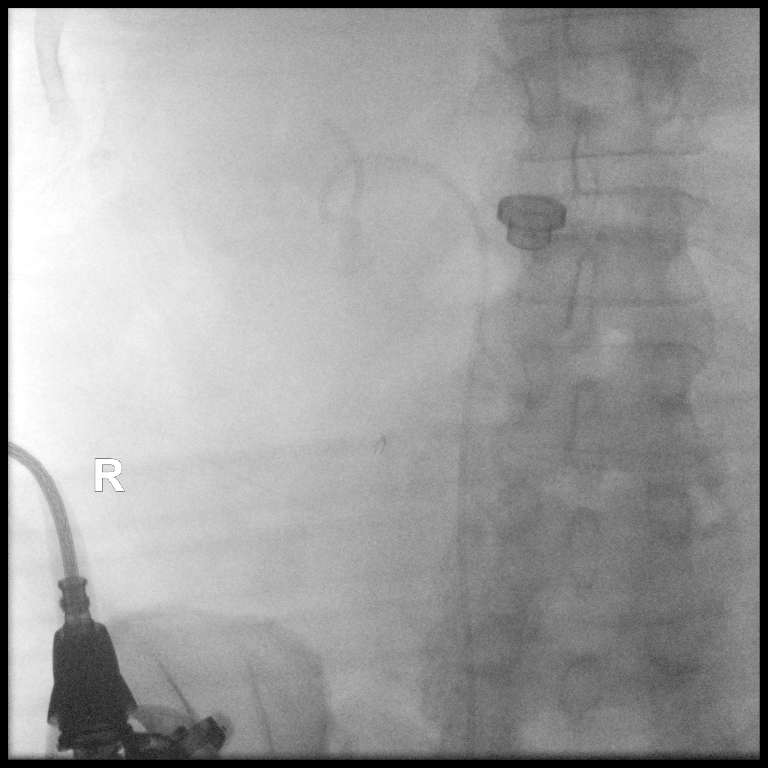

[1 of 1 positions shown; findings below may reference images not displayed]

:
Fluoroscopy was utilized by the requesting physician. No
radiographic interpretation.

## 2018-11-12 ENCOUNTER — Encounter: Payer: Medicare HMO | Admitting: Physical Medicine & Rehabilitation

## 2018-12-03 ENCOUNTER — Ambulatory Visit: Payer: Medicare HMO | Admitting: Gastroenterology

## 2018-12-09 DIAGNOSIS — Z6841 Body Mass Index (BMI) 40.0 and over, adult: Secondary | ICD-10-CM | POA: Diagnosis not present

## 2018-12-09 DIAGNOSIS — E559 Vitamin D deficiency, unspecified: Secondary | ICD-10-CM | POA: Diagnosis not present

## 2018-12-09 DIAGNOSIS — I129 Hypertensive chronic kidney disease with stage 1 through stage 4 chronic kidney disease, or unspecified chronic kidney disease: Secondary | ICD-10-CM | POA: Diagnosis not present

## 2018-12-09 DIAGNOSIS — Z1389 Encounter for screening for other disorder: Secondary | ICD-10-CM | POA: Diagnosis not present

## 2018-12-09 DIAGNOSIS — Z0001 Encounter for general adult medical examination with abnormal findings: Secondary | ICD-10-CM | POA: Diagnosis not present

## 2018-12-09 DIAGNOSIS — N183 Chronic kidney disease, stage 3 (moderate): Secondary | ICD-10-CM | POA: Diagnosis not present

## 2018-12-09 DIAGNOSIS — N342 Other urethritis: Secondary | ICD-10-CM | POA: Diagnosis not present

## 2018-12-09 DIAGNOSIS — E782 Mixed hyperlipidemia: Secondary | ICD-10-CM | POA: Diagnosis not present

## 2018-12-09 DIAGNOSIS — Z9884 Bariatric surgery status: Secondary | ICD-10-CM | POA: Diagnosis not present

## 2018-12-29 ENCOUNTER — Encounter: Payer: Medicare HMO | Attending: Physical Medicine & Rehabilitation | Admitting: Physical Medicine & Rehabilitation

## 2018-12-31 DIAGNOSIS — I219 Acute myocardial infarction, unspecified: Secondary | ICD-10-CM

## 2018-12-31 HISTORY — DX: Acute myocardial infarction, unspecified: I21.9

## 2019-01-02 ENCOUNTER — Other Ambulatory Visit (HOSPITAL_COMMUNITY)
Admission: AD | Admit: 2019-01-02 | Discharge: 2019-01-02 | Disposition: A | Discharge status: Home / Self Care | Payer: Medicare HMO | Source: Other Acute Inpatient Hospital | Attending: Urology | Admitting: Urology

## 2019-01-02 DIAGNOSIS — N390 Urinary tract infection, site not specified: Secondary | ICD-10-CM | POA: Diagnosis not present

## 2019-01-05 LAB — URINE CULTURE: Culture: >=100000 — AB

## 2019-01-16 ENCOUNTER — Other Ambulatory Visit (HOSPITAL_COMMUNITY)
Admission: RE | Admit: 2019-01-16 | Discharge: 2019-01-16 | Disposition: A | Discharge status: Home / Self Care | Payer: Medicare HMO | Source: Ambulatory Visit | Attending: Urology | Admitting: Urology

## 2019-01-16 ENCOUNTER — Ambulatory Visit (INDEPENDENT_AMBULATORY_CARE_PROVIDER_SITE_OTHER): Payer: Medicare HMO | Admitting: Urology

## 2019-01-16 DIAGNOSIS — N200 Calculus of kidney: Secondary | ICD-10-CM

## 2019-01-19 LAB — URINE CULTURE: Culture: >=100000 — AB

## 2019-01-20 DIAGNOSIS — Z6841 Body Mass Index (BMI) 40.0 and over, adult: Secondary | ICD-10-CM | POA: Diagnosis not present

## 2019-01-20 DIAGNOSIS — J111 Influenza due to unidentified influenza virus with other respiratory manifestations: Secondary | ICD-10-CM | POA: Diagnosis not present

## 2019-01-20 DIAGNOSIS — Z1389 Encounter for screening for other disorder: Secondary | ICD-10-CM | POA: Diagnosis not present

## 2019-01-20 DIAGNOSIS — J069 Acute upper respiratory infection, unspecified: Secondary | ICD-10-CM | POA: Diagnosis not present

## 2019-01-22 DIAGNOSIS — N390 Urinary tract infection, site not specified: Secondary | ICD-10-CM | POA: Diagnosis not present

## 2019-01-28 ENCOUNTER — Encounter (HOSPITAL_COMMUNITY): Payer: Self-pay | Admitting: Emergency Medicine

## 2019-01-28 DIAGNOSIS — Z955 Presence of coronary angioplasty implant and graft: Secondary | ICD-10-CM | POA: Diagnosis not present

## 2019-01-28 DIAGNOSIS — I5032 Chronic diastolic (congestive) heart failure: Secondary | ICD-10-CM | POA: Diagnosis present

## 2019-01-28 DIAGNOSIS — Z7951 Long term (current) use of inhaled steroids: Secondary | ICD-10-CM | POA: Diagnosis not present

## 2019-01-28 DIAGNOSIS — J449 Chronic obstructive pulmonary disease, unspecified: Secondary | ICD-10-CM | POA: Diagnosis present

## 2019-01-28 DIAGNOSIS — I2510 Atherosclerotic heart disease of native coronary artery without angina pectoris: Secondary | ICD-10-CM | POA: Diagnosis present

## 2019-01-28 DIAGNOSIS — G894 Chronic pain syndrome: Secondary | ICD-10-CM | POA: Diagnosis present

## 2019-01-28 DIAGNOSIS — Z79899 Other long term (current) drug therapy: Secondary | ICD-10-CM

## 2019-01-28 DIAGNOSIS — Z7984 Long term (current) use of oral hypoglycemic drugs: Secondary | ICD-10-CM

## 2019-01-28 DIAGNOSIS — B9620 Unspecified Escherichia coli [E. coli] as the cause of diseases classified elsewhere: Secondary | ICD-10-CM | POA: Diagnosis present

## 2019-01-28 DIAGNOSIS — K219 Gastro-esophageal reflux disease without esophagitis: Secondary | ICD-10-CM | POA: Diagnosis not present

## 2019-01-28 DIAGNOSIS — I130 Hypertensive heart and chronic kidney disease with heart failure and stage 1 through stage 4 chronic kidney disease, or unspecified chronic kidney disease: Secondary | ICD-10-CM | POA: Diagnosis not present

## 2019-01-28 DIAGNOSIS — I272 Pulmonary hypertension, unspecified: Secondary | ICD-10-CM | POA: Diagnosis not present

## 2019-01-28 DIAGNOSIS — Z87891 Personal history of nicotine dependence: Secondary | ICD-10-CM

## 2019-01-28 DIAGNOSIS — R509 Fever, unspecified: Secondary | ICD-10-CM | POA: Diagnosis present

## 2019-01-28 DIAGNOSIS — E1122 Type 2 diabetes mellitus with diabetic chronic kidney disease: Secondary | ICD-10-CM | POA: Diagnosis present

## 2019-01-28 DIAGNOSIS — N183 Chronic kidney disease, stage 3 (moderate): Secondary | ICD-10-CM | POA: Diagnosis present

## 2019-01-28 DIAGNOSIS — B961 Klebsiella pneumoniae [K. pneumoniae] as the cause of diseases classified elsewhere: Secondary | ICD-10-CM | POA: Diagnosis not present

## 2019-01-28 DIAGNOSIS — I4891 Unspecified atrial fibrillation: Secondary | ICD-10-CM | POA: Diagnosis present

## 2019-01-28 DIAGNOSIS — M5431 Sciatica, right side: Secondary | ICD-10-CM | POA: Diagnosis present

## 2019-01-28 DIAGNOSIS — E781 Pure hyperglyceridemia: Secondary | ICD-10-CM | POA: Diagnosis present

## 2019-01-28 DIAGNOSIS — Z833 Family history of diabetes mellitus: Secondary | ICD-10-CM | POA: Diagnosis not present

## 2019-01-28 DIAGNOSIS — Z6841 Body Mass Index (BMI) 40.0 and over, adult: Secondary | ICD-10-CM | POA: Diagnosis not present

## 2019-01-28 DIAGNOSIS — Z881 Allergy status to other antibiotic agents status: Secondary | ICD-10-CM

## 2019-01-28 DIAGNOSIS — K589 Irritable bowel syndrome without diarrhea: Secondary | ICD-10-CM | POA: Diagnosis present

## 2019-01-28 DIAGNOSIS — N390 Urinary tract infection, site not specified: Secondary | ICD-10-CM | POA: Diagnosis not present

## 2019-01-28 DIAGNOSIS — N3000 Acute cystitis without hematuria: Secondary | ICD-10-CM | POA: Diagnosis not present

## 2019-01-28 DIAGNOSIS — G4730 Sleep apnea, unspecified: Secondary | ICD-10-CM | POA: Diagnosis not present

## 2019-01-28 DIAGNOSIS — E860 Dehydration: Secondary | ICD-10-CM | POA: Diagnosis present

## 2019-01-28 DIAGNOSIS — Z8249 Family history of ischemic heart disease and other diseases of the circulatory system: Secondary | ICD-10-CM | POA: Diagnosis not present

## 2019-01-28 DIAGNOSIS — R109 Unspecified abdominal pain: Secondary | ICD-10-CM | POA: Diagnosis not present

## 2019-01-28 DIAGNOSIS — K760 Fatty (change of) liver, not elsewhere classified: Secondary | ICD-10-CM | POA: Diagnosis not present

## 2019-01-28 DIAGNOSIS — E119 Type 2 diabetes mellitus without complications: Secondary | ICD-10-CM | POA: Diagnosis not present

## 2019-01-28 DIAGNOSIS — N3001 Acute cystitis with hematuria: Secondary | ICD-10-CM | POA: Diagnosis not present

## 2019-01-28 DIAGNOSIS — K625 Hemorrhage of anus and rectum: Secondary | ICD-10-CM | POA: Diagnosis not present

## 2019-01-28 LAB — COMPREHENSIVE METABOLIC PANEL
ALT: 23 U/L (ref 0–44)
AST: 25 U/L (ref 15–41)
Albumin: 4.2 g/dL (ref 3.5–5.0)
Alkaline Phosphatase: 53 U/L (ref 38–126)
Anion gap: 9 (ref 5–15)
BUN: 19 mg/dL (ref 8–23)
CO2: 26 mmol/L (ref 22–32)
Calcium: 9.6 mg/dL (ref 8.9–10.3)
Chloride: 105 mmol/L (ref 98–111)
Creatinine, Ser: 1.43 mg/dL — ABNORMAL HIGH (ref 0.44–1.00)
GFR calc Af Amer: 46 mL/min — ABNORMAL LOW (ref >60)
GFR calc non Af Amer: 39 mL/min — ABNORMAL LOW (ref >60)
Glucose, Bld: 108 mg/dL — ABNORMAL HIGH (ref 70–99)
Potassium: 4.3 mmol/L (ref 3.5–5.1)
Sodium: 140 mmol/L (ref 135–145)
Total Bilirubin: 0.7 mg/dL (ref 0.3–1.2)
Total Protein: 7.7 g/dL (ref 6.5–8.1)

## 2019-01-28 LAB — CBC
HCT: 40.7 % (ref 36.0–46.0)
Hemoglobin: 13 g/dL (ref 12.0–15.0)
MCH: 31.6 pg (ref 26.0–34.0)
MCHC: 31.9 g/dL (ref 30.0–36.0)
MCV: 99 fL (ref 80.0–100.0)
Platelets: 323 10*3/uL (ref 150–400)
RBC: 4.11 MIL/uL (ref 3.87–5.11)
RDW: 12.5 % (ref 11.5–15.5)
WBC: 10.1 10*3/uL (ref 4.0–10.5)
nRBC: 0 % (ref 0.0–0.2)

## 2019-01-28 LAB — TYPE AND SCREEN
ABO/RH(D): A POS
Antibody Screen: POSITIVE

## 2019-01-28 NOTE — ED Notes (Signed)
Pt requesting blood work be drawn with IV

## 2019-01-28 NOTE — ED Triage Notes (Signed)
Pt c/o abd pain that been over month with dysuria. fever, dark stools started today. Adds respiratory problems and was treated for flu recently and finished medications MOnday morning.

## 2019-01-29 ENCOUNTER — Other Ambulatory Visit: Payer: Self-pay

## 2019-01-29 ENCOUNTER — Encounter (HOSPITAL_COMMUNITY): Payer: Self-pay | Admitting: Emergency Medicine

## 2019-01-29 ENCOUNTER — Emergency Department (HOSPITAL_COMMUNITY): Payer: Medicare HMO

## 2019-01-29 ENCOUNTER — Inpatient Hospital Stay (HOSPITAL_COMMUNITY)
Admission: EM | Admit: 2019-01-29 | Discharge: 2019-02-01 | DRG: 690 | Disposition: A | Discharge status: Home / Self Care | Payer: Medicare HMO | Attending: Internal Medicine | Admitting: Internal Medicine

## 2019-01-29 DIAGNOSIS — Z881 Allergy status to other antibiotic agents status: Secondary | ICD-10-CM | POA: Diagnosis not present

## 2019-01-29 DIAGNOSIS — N390 Urinary tract infection, site not specified: Secondary | ICD-10-CM | POA: Diagnosis present

## 2019-01-29 DIAGNOSIS — I5032 Chronic diastolic (congestive) heart failure: Secondary | ICD-10-CM | POA: Diagnosis present

## 2019-01-29 DIAGNOSIS — E119 Type 2 diabetes mellitus without complications: Secondary | ICD-10-CM

## 2019-01-29 DIAGNOSIS — Z955 Presence of coronary angioplasty implant and graft: Secondary | ICD-10-CM | POA: Diagnosis not present

## 2019-01-29 DIAGNOSIS — R509 Fever, unspecified: Secondary | ICD-10-CM | POA: Diagnosis present

## 2019-01-29 DIAGNOSIS — B9620 Unspecified Escherichia coli [E. coli] as the cause of diseases classified elsewhere: Secondary | ICD-10-CM | POA: Diagnosis present

## 2019-01-29 DIAGNOSIS — I5033 Acute on chronic diastolic (congestive) heart failure: Secondary | ICD-10-CM | POA: Diagnosis present

## 2019-01-29 DIAGNOSIS — I130 Hypertensive heart and chronic kidney disease with heart failure and stage 1 through stage 4 chronic kidney disease, or unspecified chronic kidney disease: Secondary | ICD-10-CM | POA: Diagnosis present

## 2019-01-29 DIAGNOSIS — Z6841 Body Mass Index (BMI) 40.0 and over, adult: Secondary | ICD-10-CM | POA: Diagnosis not present

## 2019-01-29 DIAGNOSIS — Z7984 Long term (current) use of oral hypoglycemic drugs: Secondary | ICD-10-CM | POA: Diagnosis not present

## 2019-01-29 DIAGNOSIS — E1122 Type 2 diabetes mellitus with diabetic chronic kidney disease: Secondary | ICD-10-CM | POA: Diagnosis present

## 2019-01-29 DIAGNOSIS — I272 Pulmonary hypertension, unspecified: Secondary | ICD-10-CM | POA: Diagnosis present

## 2019-01-29 DIAGNOSIS — N3000 Acute cystitis without hematuria: Secondary | ICD-10-CM

## 2019-01-29 DIAGNOSIS — M5431 Sciatica, right side: Secondary | ICD-10-CM | POA: Diagnosis present

## 2019-01-29 DIAGNOSIS — G894 Chronic pain syndrome: Secondary | ICD-10-CM | POA: Diagnosis present

## 2019-01-29 DIAGNOSIS — Z8249 Family history of ischemic heart disease and other diseases of the circulatory system: Secondary | ICD-10-CM | POA: Diagnosis not present

## 2019-01-29 DIAGNOSIS — Z833 Family history of diabetes mellitus: Secondary | ICD-10-CM | POA: Diagnosis not present

## 2019-01-29 DIAGNOSIS — E781 Pure hyperglyceridemia: Secondary | ICD-10-CM | POA: Diagnosis present

## 2019-01-29 DIAGNOSIS — E860 Dehydration: Secondary | ICD-10-CM | POA: Diagnosis present

## 2019-01-29 DIAGNOSIS — Z7951 Long term (current) use of inhaled steroids: Secondary | ICD-10-CM | POA: Diagnosis not present

## 2019-01-29 DIAGNOSIS — K219 Gastro-esophageal reflux disease without esophagitis: Secondary | ICD-10-CM | POA: Diagnosis present

## 2019-01-29 DIAGNOSIS — B961 Klebsiella pneumoniae [K. pneumoniae] as the cause of diseases classified elsewhere: Secondary | ICD-10-CM | POA: Diagnosis present

## 2019-01-29 DIAGNOSIS — J150 Pneumonia due to Klebsiella pneumoniae: Secondary | ICD-10-CM | POA: Diagnosis present

## 2019-01-29 DIAGNOSIS — N3001 Acute cystitis with hematuria: Secondary | ICD-10-CM | POA: Diagnosis not present

## 2019-01-29 DIAGNOSIS — Z79899 Other long term (current) drug therapy: Secondary | ICD-10-CM | POA: Diagnosis not present

## 2019-01-29 DIAGNOSIS — I2510 Atherosclerotic heart disease of native coronary artery without angina pectoris: Secondary | ICD-10-CM | POA: Diagnosis present

## 2019-01-29 DIAGNOSIS — K589 Irritable bowel syndrome without diarrhea: Secondary | ICD-10-CM | POA: Diagnosis present

## 2019-01-29 DIAGNOSIS — G4730 Sleep apnea, unspecified: Secondary | ICD-10-CM | POA: Diagnosis present

## 2019-01-29 DIAGNOSIS — Z87891 Personal history of nicotine dependence: Secondary | ICD-10-CM | POA: Diagnosis not present

## 2019-01-29 LAB — GLUCOSE, CAPILLARY
Glucose-Capillary: 96 mg/dL (ref 70–99)
Glucose-Capillary: 140 mg/dL — ABNORMAL HIGH (ref 70–99)
Glucose-Capillary: 144 mg/dL — ABNORMAL HIGH (ref 70–99)
Glucose-Capillary: 124 mg/dL — ABNORMAL HIGH (ref 70–99)

## 2019-01-29 LAB — URINALYSIS, ROUTINE W REFLEX MICROSCOPIC
Bilirubin Urine: NEGATIVE
Glucose, UA: NEGATIVE mg/dL
Hgb urine dipstick: NEGATIVE
Ketones, ur: NEGATIVE mg/dL
Nitrite: POSITIVE — AB
Protein, ur: 100 mg/dL — AB
Specific Gravity, Urine: 1.018 (ref 1.005–1.030)
WBC, UA: >50 WBC/hpf — ABNORMAL HIGH (ref 0–5)
pH: 5 (ref 5.0–8.0)

## 2019-01-29 LAB — POC OCCULT BLOOD, ED: Fecal Occult Bld: NEGATIVE

## 2019-01-29 LAB — HEMOGLOBIN A1C
Hgb A1c MFr Bld: 6.3 % — ABNORMAL HIGH (ref 4.8–5.6)
Mean Plasma Glucose: 134.11 mg/dL

## 2019-01-29 MED ORDER — GENTAMICIN SULFATE 40 MG/ML IJ SOLN
5.0000 mg/kg | INTRAVENOUS | Status: AC
  Administered 2019-01-30 – 2019-01-31 (×2): 400 mg via INTRAVENOUS
  Filled 2019-01-29 (×2): qty 10

## 2019-01-29 MED ORDER — OMEGA-3-ACID ETHYL ESTERS 1 G PO CAPS
1.0000 g | ORAL_CAPSULE | Freq: Two times a day (BID) | ORAL | Status: DC
  Administered 2019-01-29 – 2019-01-31 (×6): 1 g via ORAL
  Filled 2019-01-29 (×6): qty 1

## 2019-01-29 MED ORDER — OXYCODONE-ACETAMINOPHEN 10-325 MG PO TABS: 1.0000 | ORAL_TABLET | Freq: Four times a day (QID) | ORAL | Status: DC | PRN

## 2019-01-29 MED ORDER — ACETAMINOPHEN 325 MG PO TABS
650.0000 mg | ORAL_TABLET | Freq: Four times a day (QID) | ORAL | Status: DC | PRN
  Administered 2019-02-01: 650 mg via ORAL
  Filled 2019-01-29: qty 2

## 2019-01-29 MED ORDER — GENTAMICIN SULFATE 40 MG/ML IJ SOLN: 5.0000 mg/kg | Freq: Once | INTRAVENOUS | Status: DC

## 2019-01-29 MED ORDER — FLUTICASONE PROPIONATE 50 MCG/ACT NA SUSP
1.0000 | Freq: Every day | NASAL | Status: DC
  Administered 2019-01-29: 1 via NASAL
  Administered 2019-01-31: 2 via NASAL
  Filled 2019-01-29: qty 16

## 2019-01-29 MED ORDER — FENOFIBRATE 160 MG PO TABS
160.0000 mg | ORAL_TABLET | Freq: Every day | ORAL | Status: DC
  Administered 2019-01-29 – 2019-01-31 (×3): 160 mg via ORAL
  Filled 2019-01-29 (×4): qty 1

## 2019-01-29 MED ORDER — POLYETHYLENE GLYCOL 3350 17 G PO PACK
17.0000 g | PACK | Freq: Two times a day (BID) | ORAL | Status: DC
  Administered 2019-01-30: 17 g via ORAL
  Filled 2019-01-29 (×3): qty 1

## 2019-01-29 MED ORDER — ENOXAPARIN SODIUM 40 MG/0.4ML ~~LOC~~ SOLN
40.0000 mg | SUBCUTANEOUS | Status: DC
  Administered 2019-01-29 – 2019-01-31 (×3): 40 mg via SUBCUTANEOUS
  Filled 2019-01-29 (×3): qty 0.4

## 2019-01-29 MED ORDER — ONDANSETRON HCL 4 MG PO TABS: 4.0000 mg | ORAL_TABLET | Freq: Four times a day (QID) | ORAL | Status: DC | PRN

## 2019-01-29 MED ORDER — SODIUM CHLORIDE 0.9 % IV SOLN
INTRAVENOUS | Status: AC
  Administered 2019-01-29: 08:00:00 via INTRAVENOUS

## 2019-01-29 MED ORDER — ISOSORBIDE MONONITRATE ER 30 MG PO TB24
30.0000 mg | ORAL_TABLET | Freq: Every day | ORAL | Status: DC
  Administered 2019-01-29 – 2019-01-31 (×3): 30 mg via ORAL
  Filled 2019-01-29 (×3): qty 1

## 2019-01-29 MED ORDER — INSULIN ASPART 100 UNIT/ML ~~LOC~~ SOLN
0.0000 [IU] | Freq: Three times a day (TID) | SUBCUTANEOUS | Status: DC
  Administered 2019-01-29 (×2): 1 [IU] via SUBCUTANEOUS
  Administered 2019-01-30: 2 [IU] via SUBCUTANEOUS
  Administered 2019-01-30 – 2019-01-31 (×2): 1 [IU] via SUBCUTANEOUS

## 2019-01-29 MED ORDER — BACLOFEN 10 MG PO TABS
10.0000 mg | ORAL_TABLET | Freq: Three times a day (TID) | ORAL | Status: DC | PRN
  Administered 2019-01-29 – 2019-02-01 (×2): 10 mg via ORAL
  Filled 2019-01-29 (×2): qty 1

## 2019-01-29 MED ORDER — ALBUTEROL SULFATE (2.5 MG/3ML) 0.083% IN NEBU: 2.5000 mg | INHALATION_SOLUTION | RESPIRATORY_TRACT | Status: DC | PRN

## 2019-01-29 MED ORDER — SENNA 8.6 MG PO TABS
1.0000 | ORAL_TABLET | Freq: Two times a day (BID) | ORAL | Status: DC
  Administered 2019-01-29 – 2019-01-30 (×2): 8.6 mg via ORAL
  Filled 2019-01-29 (×4): qty 1

## 2019-01-29 MED ORDER — ONDANSETRON HCL 4 MG/2ML IJ SOLN: 4.0000 mg | Freq: Four times a day (QID) | INTRAMUSCULAR | Status: DC | PRN

## 2019-01-29 MED ORDER — NITROGLYCERIN 0.4 MG SL SUBL: 0.4000 mg | SUBLINGUAL_TABLET | SUBLINGUAL | Status: DC | PRN

## 2019-01-29 MED ORDER — LORATADINE 10 MG PO TABS: 10.0000 mg | ORAL_TABLET | Freq: Every day | ORAL | Status: DC | PRN

## 2019-01-29 MED ORDER — MAGNESIUM OXIDE 400 (241.3 MG) MG PO TABS
400.0000 mg | ORAL_TABLET | Freq: Two times a day (BID) | ORAL | Status: DC
  Administered 2019-01-29 – 2019-01-31 (×6): 400 mg via ORAL
  Filled 2019-01-29 (×6): qty 1

## 2019-01-29 MED ORDER — GABAPENTIN 300 MG PO CAPS
600.0000 mg | ORAL_CAPSULE | Freq: Three times a day (TID) | ORAL | Status: DC
  Administered 2019-01-29 – 2019-01-31 (×9): 600 mg via ORAL
  Filled 2019-01-29 (×9): qty 2

## 2019-01-29 MED ORDER — ACETAMINOPHEN 650 MG RE SUPP: 650.0000 mg | Freq: Four times a day (QID) | RECTAL | Status: DC | PRN

## 2019-01-29 MED ORDER — OXYCODONE-ACETAMINOPHEN 5-325 MG PO TABS
1.0000 | ORAL_TABLET | Freq: Four times a day (QID) | ORAL | Status: DC | PRN
  Administered 2019-01-29 – 2019-02-01 (×10): 1 via ORAL
  Filled 2019-01-29 (×10): qty 1

## 2019-01-29 MED ORDER — OXYCODONE HCL 5 MG PO TABS
5.0000 mg | ORAL_TABLET | Freq: Four times a day (QID) | ORAL | Status: DC | PRN
  Administered 2019-01-29 – 2019-02-01 (×10): 5 mg via ORAL
  Filled 2019-01-29 (×9): qty 1

## 2019-01-29 MED ORDER — GENTAMICIN SULFATE 40 MG/ML IJ SOLN
340.0000 mg | INTRAVENOUS | Status: DC
  Administered 2019-01-29: 340 mg via INTRAVENOUS
  Filled 2019-01-29: qty 8.5

## 2019-01-29 MED ORDER — BIOTENE DRY MOUTH MT LIQD: 15.0000 mL | OROMUCOSAL | Status: DC | PRN

## 2019-01-29 MED ORDER — AMLODIPINE BESYLATE 10 MG PO TABS
10.0000 mg | ORAL_TABLET | Freq: Every evening | ORAL | Status: DC
  Administered 2019-01-29 – 2019-01-31 (×3): 10 mg via ORAL
  Filled 2019-01-29 (×3): qty 1

## 2019-01-29 NOTE — Progress Notes (Signed)
Patient wanted AD materials.  I shared them with her and she is filling them out. She will likely be ready to complete the AD Friday.  Patient expressed she does not want to be a burden for anyone to have to be responsible for.  She spoke about maybe she wants to be a DNR?  Chaplain offered to have prayer with her as she has these forms to consider and complete. Prayer for peace in her mind and heart and for her medical team caring for her.  She was very kind and receptive to visit. Conard Novak, Chaplain'   01/29/19 1400  Clinical Encounter Type  Visited With Patient  Visit Type Initial;Spiritual support;Other (Comment) (Wanted AD forms)  Referral From Patient  Consult/Referral To Chaplain  Spiritual Encounters  Spiritual Needs Literature  Stress Factors  Patient Stress Factors Health changes  Family Stress Factors Other (Comment) (does not want to be a burden)

## 2019-01-29 NOTE — ED Notes (Signed)
ED TO INPATIENT HANDOFF REPORT  Name/Age/Gender Sara Tran 63 y.o. female  Code Status Code Status History    Date Active Date Inactive Code Status Order ID Comments User Context   08/08/2017 1500 08/11/2017 1812 Full Code [id number]  Irine Seal, MD Inpatient   05/29/2017 2314 06/01/2017 2113 Full Code [id number]  Orvan Falconer, MD Inpatient   04/01/2016 1701 04/02/2016 1649 Full Code [id number]  Truett Mainland, DO Inpatient      Home/SNF/Other Home  Chief Complaint Fever;Abdominal Pain;Dark Stool  Level of Care/Admitting Diagnosis ED Disposition    ED Disposition Condition Comment   Admit  Hospital Area: [facility] [[id number]]  Level of Care: Med-Surg [16]  Diagnosis: Complicated UTI (urinary tract infection) [379024]  Admitting Physician: Rise Patience [phone]  Attending Physician: Rise Patience Lei.Right  PT Class (Do Not Modify): Observation [104]  PT Acc Code (Do Not Modify): Observation [10022]       Medical History Past Medical History:  Diagnosis Date  . Adnexal cyst    Right simple cyst seen on [place]  . Anginal pain ([place])   . Anxiety   . Arthritis   . Asthma   . Atrial fibrillation (HCC)    hx of   . CHF (congestive heart failure) ([place])   . Chronic back pain   . Chronic hip pain   . COPD (chronic obstructive pulmonary disease) ([place])   . Coronary artery disease    stents placed   . Dyspnea   . GERD (gastroesophageal reflux disease)   . Headache    due to pinched nerve damaage   . Heart murmur    hx of slight murmur   . Herpes simplex virus (HSV) infection   . History of bronchitis   . History of cardiac catheterization    Normal coronaries 2013  . History of kidney stones   . Hot flashes   . Hypertension   . Hypertriglyceridemia   . IBS (irritable bowel syndrome)   . Internal hemorrhoids   . Morbid obesity ([place])    s/p lap band surgery  . Myocardial infarction ([place])    times 3  . Obesity   . Pulmonary HTN ([place])    [place]  2008:  RA pressures 13/10 with a mean of 7 mmHg.  RV pressure 53/29 with end diastolic pressure of 15 mmHg.  PA pressure 49/23 with a mean of 35 mmHg.  Pulmonary capillary wedge 17/50 with a mean of 14 mmHg. The PA saturation was 68%.  RA saturation was 71% and aortic saturation was 90%.  Cardiac output was 6.0 with a cardiac index of 2.80 by Fick.  Normal coronaries by cath 2008.  Marland Kitchen Renal insufficiency   . Sciatica of right side   . Sleep apnea    CPAP use does not know settings   . Type 2 diabetes mellitus ([place])    type II   . Uterine fibroid     Allergies Allergies  Allergen Reactions  . Bee Venom Anaphylaxis    As a child  . Ancef [Cefazolin] Other (See Comments)    Patient was told she was allergic but doesn't remember what happens  . Aspirin Other (See Comments)    Rectal bleeding  . Ceftin [Cefuroxime Axetil] Swelling    Swollen tongue, "made me crazy"  . Ciprofloxacin Cough    Coughed up blood  . Macrodantin [Nitrofurantoin] Other (See Comments)    Vomited blood and had dark stool.  . Milk-Related Compounds  Other (See Comments)    IBS  . Other     Snake venom - not sure of reaction -had problems with feet   . Latex Itching and Rash    IV Location/Drains/Wounds Patient Lines/Drains/Airways Status   Active Line/Drains/Airways    Name:   Placement date:   Placement time:   Site:   Days:   Peripheral IV 01/29/19 Right Arm   01/29/19    0550    Arm   less than 1   Ureteral Drain/Stent Right ureter 6 Fr.   08/08/17    0921    Right ureter   539   Incision (Closed) 08/08/17 Perineum   08/08/17    0747     539          Labs/Imaging Results for orders placed or performed during the hospital encounter of 01/29/19 (from the past 48 hour(s))  Comprehensive metabolic panel     Status: Abnormal   Collection Time: 01/28/19  8:21 PM  Result Value Ref Range   Sodium 140 135 - 145 mmol/L   Potassium 4.3 3.5 - 5.1 mmol/L   Chloride 105 98 - 111 mmol/L   CO2 26 22 - 32 mmol/L    Glucose, Bld 108 (H) 70 - 99 mg/dL   BUN 19 8 - 23 mg/dL   Creatinine, Ser 1.43 (H) 0.44 - 1.00 mg/dL   Calcium 9.6 8.9 - 10.3 mg/dL   Total Protein 7.7 6.5 - 8.1 g/dL   Albumin 4.2 3.5 - 5.0 g/dL   AST 25 15 - 41 U/L   ALT 23 0 - 44 U/L   Alkaline Phosphatase 53 38 - 126 U/L   Total Bilirubin 0.7 0.3 - 1.2 mg/dL   GFR calc non Af Amer 39 (L) >60 mL/min   GFR calc Af Amer 46 (L) >60 mL/min   Anion gap 9 5 - 15    Comment: Performed at [facility], [address], [place], [place] [address]  CBC     Status: None   Collection Time: 01/28/19  8:21 PM  Result Value Ref Range   WBC 10.1 4.0 - 10.5 K/uL   RBC 4.11 3.87 - 5.11 MIL/uL   Hemoglobin 13.0 12.0 - 15.0 g/dL   HCT 40.7 36.0 - 46.0 %   MCV 99.0 80.0 - 100.0 fL   MCH 31.6 26.0 - 34.0 pg   MCHC 31.9 30.0 - 36.0 g/dL   RDW 12.5 11.5 - 15.5 %   Platelets 323 150 - 400 K/uL   nRBC 0.0 0.0 - 0.2 %    Comment: Performed at [facility], [place] [address], [place], [place] [address]  Type and screen [place]     Status: None   Collection Time: 01/28/19  8:21 PM  Result Value Ref Range   ABO/RH(D) A POS    Antibody Screen POS    Sample Expiration      01/31/2019 Performed at [facility], [place] [address] [place], [place] [address]   POC occult blood, ED     Status: None   Collection Time: 01/29/19  3:17 AM  Result Value Ref Range   Fecal Occult Bld NEGATIVE NEGATIVE  Urinalysis, Routine w reflex microscopic     Status: Abnormal   Collection Time: 01/29/19  3:53 AM  Result Value Ref Range   Color, Urine YELLOW YELLOW   APPearance CLOUDY (A) CLEAR   Specific Gravity, Urine 1.018 1.005 - 1.030  pH 5.0 5.0 - 8.0   Glucose, UA NEGATIVE NEGATIVE mg/dL   Hgb urine dipstick NEGATIVE NEGATIVE   Bilirubin Urine NEGATIVE NEGATIVE   Ketones, ur NEGATIVE NEGATIVE mg/dL   Protein, ur 100 (A) NEGATIVE mg/dL   Nitrite POSITIVE (A) NEGATIVE   Leukocytes, UA  MODERATE (A) NEGATIVE   RBC / HPF 6-10 0 - 5 RBC/hpf   WBC, UA >50 (H) 0 - 5 WBC/hpf   Bacteria, UA MANY (A) NONE SEEN   Squamous Epithelial / LPF 11-20 0 - 5   WBC Clumps PRESENT    Mucus PRESENT     Comment: Performed at [facility], [place] [address] [place], [place] [address]   Ct Renal Stone Study  Result Date: 01/29/2019 CLINICAL DATA:  Abdominal pain for 1 month EXAM: CT ABDOMEN AND PELVIS WITHOUT CONTRAST TECHNIQUE: Multidetector CT imaging of the abdomen and pelvis was performed following the standard protocol without IV contrast. COMPARISON:  06/20/2018 FINDINGS: Lower chest:  No contributory findings. Hepatobiliary: Hepatic steatosis and hepatic enlargement. No focal abnormality.Cholecystectomy. No bile duct dilatation. Pancreas: Unremarkable. Spleen: Unremarkable. Adrenals/Urinary Tract: Negative adrenals. No hydronephrosis or ureteral stone. 4 mm right lower pole calculus. Unremarkable bladder. Stomach/Bowel: No obstruction. Moderate formed stool without rectal impaction or obstruction. Left colonic diverticulosis. Appendectomy. Lap band in good position. Vascular/Lymphatic: Atherosclerotic calcification of the aorta no mass or adenopathy. Reproductive:Exophytic calcified fibroid.  Negative adnexae. Other: No ascites or pneumoperitoneum. Musculoskeletal: No acute abnormalities. Spondylosis. Lumbar facet spurring and bilateral sacroiliac osteoarthritis. IMPRESSION: 1. No acute finding. 2. Moderate stool retention. 3. Hepatic steatosis and right nephrolithiasis. Electronically Signed   By: Monte Fantasia M.D.   On: 01/29/2019 05:20    Pending Labs Unresulted Labs (From admission, onward)    Start     Ordered   01/29/19 0313  Urine culture  ONCE - STAT,   STAT    Question:  Patient immune status  Answer:  Normal   01/29/19 0312          Vitals/Pain Today's Vitals   01/28/19 2213 01/29/19 0154 01/29/19 0541 01/29/19 0615  BP: (!) 164/72 (!) 160/83  (!) 154/70   Pulse: 99 85  77  Resp: 16 16  17   Temp: 99.1 F (37.3 C) 98.7 F (37.1 C)    TempSrc: Oral Oral    SpO2: 94% 96%  96%  Weight:   122.2 kg   Height:   4\' 11"  (1.499 m)   PainSc:    Asleep    Isolation Precautions No active isolations  Medications Medications  gentamicin (GARAMYCIN) 340 mg in dextrose 5 % 100 mL IVPB ( Intravenous Rate/Dose Verify 01/29/19 9323)    Mobility walks

## 2019-01-29 NOTE — ED Provider Notes (Signed)
[facility] DEPT Provider Note   CSN: [id number] Arrival date & time: 01/28/19  1614     History   Chief Complaint Chief Complaint  Patient presents with  . Fever  . Abdominal Pain  . dark stools    HPI Sara Tran is a 63 y.o. female.  The history is provided by the patient.  Abdominal Pain  Pain location:  Suprapubic Pain quality: aching   Pain radiates to:  Does not radiate Pain severity:  Severe Onset quality:  Gradual Timing:  Constant Progression:  Unchanged Chronicity:  Recurrent Context: not recent travel and not trauma   Relieved by:  Nothing Worsened by:  Nothing Ineffective treatments:  None tried Associated symptoms: dysuria   Associated symptoms: no chest pain, no constipation, no cough, no diarrhea and no vomiting   Risk factors: has not had multiple surgeries   Patient states she was sent in for admission from PMD for UTI as she has had chronic UTI not improving on oral antibiotics and she sees [facility].  States she also had dark stool in am.  No f/c/r.    Past Medical History:  Diagnosis Date  . Adnexal cyst    Right simple cyst seen on [place]  . Anginal pain ([place])   . Anxiety   . Arthritis   . Asthma   . Atrial fibrillation (HCC)    hx of   . CHF (congestive heart failure) ([place])   . Chronic back pain   . Chronic hip pain   . COPD (chronic obstructive pulmonary disease) ([place])   . Coronary artery disease    stents placed   . Dyspnea   . GERD (gastroesophageal reflux disease)   . Headache    due to pinched nerve damaage   . Heart murmur    hx of slight murmur   . Herpes simplex virus (HSV) infection   . History of bronchitis   . History of cardiac catheterization    Normal coronaries 2013  . History of kidney stones   . Hot flashes   . Hypertension   . Hypertriglyceridemia   . IBS (irritable bowel syndrome)   . Internal hemorrhoids   . Morbid obesity ([place])    s/p lap band surgery  . Myocardial  infarction ([place])    times 3  . Obesity   . Pulmonary HTN ([place])    [place] 2008:  RA pressures 13/10 with a mean of 7 mmHg.  RV pressure 54/65 with end diastolic pressure of 15 mmHg.  PA pressure 49/23 with a mean of 35 mmHg.  Pulmonary capillary wedge 17/50 with a mean of 14 mmHg. The PA saturation was 68%.  RA saturation was 71% and aortic saturation was 90%.  Cardiac output was 6.0 with a cardiac index of 2.80 by Fick.  Normal coronaries by cath 2008.  Marland Kitchen Renal insufficiency   . Sciatica of right side   . Sleep apnea    CPAP use does not know settings   . Type 2 diabetes mellitus ([place])    type II   . Uterine fibroid     Patient Active Problem List   Diagnosis Date Noted  . Levoscoliosis 11/20/2017  . [place] kidney stones 08/08/2017  . GI bleed 05/29/2017  . Sepsis due to urinary tract infection ([place]) 05/29/2017  . UTI (urinary tract infection) 05/29/2017  . Cervicalgia 11/21/2016  . Chronic pain syndrome 10/11/2015  . Lumbar radiculopathy 07/25/2015  . Fibroid 10/13/2013  . Adnexal  cyst 10/13/2013  . PMB (postmenopausal bleeding) 10/06/2013  . Unspecified symptom associated with female genital organs 10/06/2013  . Hot flashes 10/06/2013  . Internal hemorrhoids with other complication 83/38/2505  . 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 Hip pain 04/09/2013  . Hemorrhoids, internal 03/12/2013  . Back pain 03/11/2013  . Encounter for therapeutic drug monitoring 03/11/2013  . Trochanteric bursitis 03/11/2013  . Lumbar facet arthropathy 03/11/2013  . Unstable angina ([place]) 06/02/2012  . Acute on chronic renal insufficiency 06/02/2012  . Chest pain 04/22/2012  . IBS (irritable bowel syndrome)-DIARRHEA 01/23/2012  . Morbid obesity ([place]) 08/09/2011  . Diabetes mellitus ([place]) 08/09/2011  . DIARRHEA 10/11/2010  . Chronic diastolic heart failure ([place]) 12/16/2007  . Pulmonary hypertension ([place]) 12/12/2007  . OSTEOARTHRITIS 12/11/2007  . SLEEP APNEA 12/11/2007    Past Surgical  History:  Procedure Laterality Date  . APPENDECTOMY    . CARDIAC CATHETERIZATION  07/2007, 05/2012   Normal coronary arteries  . COLONOSCOPY  10/18/10   SLF:2-mm sessile sigmoid polyp/diverticula  . CYSTOSCOPY/URETEROSCOPY/HOLMIUM LASER/STENT PLACEMENT Right 08/01/2017   Procedure: CYSTOSCOPY/ RIGHT URETEROSCOPY/ RIGHT RETROGRADE/ HOLMIUM LASER  AND RIGHT STENT PLACEMENT FIRST STAGE;  Surgeon: Irine Seal, MD;  Location: [facility];  Service: Urology;  Laterality: Right;  . CYSTOSCOPY/URETEROSCOPY/HOLMIUM LASER/STENT PLACEMENT Right 08/08/2017   Procedure: RIGHT URETEROSCOPY WITH HOLMIUM LASER RIGHT STENT PLACEMENT;  Surgeon: Irine Seal, MD;  Location: [facility];  Service: Urology;  Laterality: Right;  . EXTRACORPOREAL SHOCK WAVE LITHOTRIPSY Right 08/22/2017   Procedure: RIGHT EXTRACORPOREAL SHOCK WAVE LITHOTRIPSY (ESWL);  Surgeon: Alexis Frock, MD;  Location: [facility];  Service: Urology;  Laterality: Right;  . HEMORRHOID BANDING  07/2012   Dr. Oneida Alar  . LAPAROSCOPIC CHOLECYSTECTOMY  1985  . LAPAROSCOPIC GASTRIC BANDING  12/12/2010  . LEFT AND RIGHT HEART CATHETERIZATION WITH CORONARY ANGIOGRAM N/A 06/03/2012   Procedure: LEFT AND RIGHT HEART CATHETERIZATION WITH CORONARY ANGIOGRAM;  Surgeon: Jolaine Artist, MD;  Location: [facility] CATH LAB;  Service: Cardiovascular;  Laterality: N/A;  . PILONIDAL CYST EXCISION  1974  . 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 TUMOR EXCISION  10/2011   Face     OB History    Gravida  0   Para  0   Term  0   Preterm  0   AB  0   Living  0     SAB  0   TAB  0   Ectopic  0   Multiple  0   Live Births               Home Medications    Prior to Admission medications   Medication Sig Start Date End Date Taking? Authorizing Provider  albuterol (PROVENTIL HFA;VENTOLIN HFA) 108 (90 BASE) MCG/ACT inhaler Inhale 2 puffs into the lungs every 6 (six) hours as needed. FOR SHORTNESS OF BREATH/WHEEZING    [provider]  albuterol (PROVENTIL)  (2.5 MG/3ML) 0.083% nebulizer solution Take 2.5 mg by nebulization every 4 (four) hours as needed for wheezing or shortness of breath.  09/19/15   [provider]  ALPRAZolam Duanne Moron) 0.5 MG tablet Take 0.5 mg by mouth 3 (three) times daily as needed. For anxiety/sleep 11/24/14   [provider]  amLODipine (NORVASC) 10 MG tablet Take 1 tablet by mouth every evening.  04/10/13   [provider]  amoxicillin-clavulanate (AUGMENTIN) 875-125 MG tablet Take 1 tablet by mouth every 12 (twelve) hours. 06/20/18   Lily Kocher, PA-C  baclofen (LIORESAL)  10 MG tablet Take 1 tablet (10 mg total) by mouth 3 (three) times daily as needed for muscle spasms. 08/13/18   Meredith Staggers, MD  budesonide (PULMICORT) 180 MCG/ACT inhaler Inhale 2 puffs into the lungs 2 (two) times daily.    [provider]  Cholecalciferol (VITAMIN D3 PO) Take 1 capsule by mouth daily.    [provider]  Dulaglutide (TRULICITY) 4.17 EY/8.1KG SOPN Inject 0.75 mg into the skin once a week. 01/17/18   Renato Shin, MD  fenofibrate 160 MG tablet Take 160 mg by mouth daily. 07/21/15   [provider]  fluticasone (FLONASE) 50 MCG/ACT nasal spray Place 1-2 sprays into the nose daily.     [provider]  gabapentin (NEURONTIN) 300 MG capsule Take 300 mg by mouth 3 (three) times daily.    [provider]  ipratropium (ATROVENT) 0.02 % nebulizer solution Take 0.5 mg by nebulization every 6 (six) hours as needed.  09/19/15   [provider]  isosorbide mononitrate (IMDUR) 30 MG 24 hr tablet Take 30 mg by mouth every morning.  09/05/11   [provider]  loratadine (CLARITIN) 10 MG tablet Take 1 tablet by mouth daily. 05/27/17   [provider]  losartan (COZAAR) 50 MG tablet Take 1 tablet (50 mg total) by mouth daily. 10/28/17 01/26/18  Bensimhon, Shaune Pascal, MD  magnesium oxide (MAG-OX) 400 MG tablet Take 400 mg by mouth daily.    [provider]    metFORMIN (GLUCOPHAGE-XR) 500 MG 24 hr tablet Take 1 tablet by mouth daily. 05/20/17   [provider]  Multiple Vitamins-Minerals (ONE-A-DAY 50 PLUS PO) Take 1 tablet by mouth daily.    [provider]  nitroGLYCERIN (NITROSTAT) 0.4 MG SL tablet Place 0.4 mg under the tongue every 5 (five) minutes as needed. Reported on 06/14/2016    [provider]  omega-3 acid ethyl esters (LOVAZA) 1 g capsule Take by mouth 2 (two) times daily.    [provider]  potassium chloride SA (K-DUR,KLOR-CON) 20 MEQ tablet Take 20 mEq by mouth every morning.     [provider]  tiZANidine (ZANAFLEX) 4 MG tablet Take 4 mg by mouth every 8 (eight) hours as needed for muscle spasms.    [provider]    Family History Family History  Problem Relation Age of Onset  . Diabetes Mother   . Heart failure Mother   . Breast cancer Sister   . CAD Father   . Hypertension Father   . Cancer Paternal Grandfather   . Hypertension Paternal Grandmother   . Stroke Paternal Grandmother   . Other Maternal Grandmother        tonsilitis  . Diabetes Maternal Grandfather   . Hypertension Maternal Grandfather   . Breast cancer Paternal Aunt     Social History Social History   Tobacco Use  . Smoking status: Former Smoker    Types: Cigarettes    Last attempt to quit: 08/08/1996    Years since quitting: 22.4  . Smokeless tobacco: Never Used  Substance Use Topics  . Alcohol use: No  . Drug use: No     Allergies   Bee venom; Ancef [cefazolin]; Aspirin; Ceftin [cefuroxime axetil]; Ciprofloxacin; Macrodantin [nitrofurantoin]; Milk-related compounds; Other; and Latex   Review of Systems Review of Systems  Respiratory: Negative for cough.   Cardiovascular: Negative for chest pain.  Gastrointestinal: Positive for abdominal pain. Negative for constipation, diarrhea and vomiting.  Genitourinary: Positive for dysuria.  Physical Exam Updated Vital Signs BP (!)  160/83 (BP Location: Right Arm)   Pulse 85   Temp 98.7 F (37.1 C) (Oral)   Resp 16   LMP 06/06/2013   SpO2 96%   Physical Exam Vitals signs and nursing note reviewed.  Constitutional:      Appearance: She is well-developed. She is obese.  HENT:     Head: Normocephalic and atraumatic.     Mouth/Throat:     Mouth: Mucous membranes are moist.  Eyes:     Extraocular Movements: Extraocular movements intact.     Pupils: Pupils are equal, round, and reactive to light.  Cardiovascular:     Rate and Rhythm: Normal rate and regular rhythm.     Heart sounds: Normal heart sounds.  Pulmonary:     Effort: Pulmonary effort is normal.     Breath sounds: Normal breath sounds.  Abdominal:     General: Abdomen is protuberant. Bowel sounds are normal. There is no distension.     Palpations: Abdomen is soft. There is no splenomegaly or mass.     Tenderness: There is no abdominal tenderness.     Hernia: No hernia is present.  Genitourinary:    Rectum: Normal. Guaiac result negative.  Skin:    General: Skin is warm and dry.     Capillary Refill: Capillary refill takes less than 2 seconds.  Neurological:     General: No focal deficit present.     Mental Status: She is alert and oriented to person, place, and time.  Psychiatric:        Mood and Affect: Mood normal.        Behavior: Behavior normal.      ED Treatments / Results  Labs (all labs ordered are listed, but only abnormal results are displayed) Results for orders placed or performed during the hospital encounter of 01/29/19  Comprehensive metabolic panel  Result Value Ref Range   Sodium 140 135 - 145 mmol/L   Potassium 4.3 3.5 - 5.1 mmol/L   Chloride 105 98 - 111 mmol/L   CO2 26 22 - 32 mmol/L   Glucose, Bld 108 (H) 70 - 99 mg/dL   BUN 19 8 - 23 mg/dL   Creatinine, Ser 1.43 (H) 0.44 - 1.00 mg/dL   Calcium 9.6 8.9 - 10.3 mg/dL   Total Protein 7.7 6.5 - 8.1 g/dL   Albumin 4.2 3.5 - 5.0 g/dL   AST 25 15 - 41 U/L   ALT 23 0  - 44 U/L   Alkaline Phosphatase 53 38 - 126 U/L   Total Bilirubin 0.7 0.3 - 1.2 mg/dL   GFR calc non Af Amer 39 (L) >60 mL/min   GFR calc Af Amer 46 (L) >60 mL/min   Anion gap 9 5 - 15  CBC  Result Value Ref Range   WBC 10.1 4.0 - 10.5 K/uL   RBC 4.11 3.87 - 5.11 MIL/uL   Hemoglobin 13.0 12.0 - 15.0 g/dL   HCT 40.7 36.0 - 46.0 %   MCV 99.0 80.0 - 100.0 fL   MCH 31.6 26.0 - 34.0 pg   MCHC 31.9 30.0 - 36.0 g/dL   RDW 12.5 11.5 - 15.5 %   Platelets 323 150 - 400 K/uL   nRBC 0.0 0.0 - 0.2 %  Urinalysis, Routine w reflex microscopic  Result Value Ref Range   Color, Urine YELLOW YELLOW   APPearance CLOUDY (A) CLEAR   Specific Gravity, Urine 1.018 1.005 - 1.030  pH 5.0 5.0 - 8.0   Glucose, UA NEGATIVE NEGATIVE mg/dL   Hgb urine dipstick NEGATIVE NEGATIVE   Bilirubin Urine NEGATIVE NEGATIVE   Ketones, ur NEGATIVE NEGATIVE mg/dL   Protein, ur 100 (A) NEGATIVE mg/dL   Nitrite POSITIVE (A) NEGATIVE   Leukocytes, UA MODERATE (A) NEGATIVE   RBC / HPF 6-10 0 - 5 RBC/hpf   WBC, UA >50 (H) 0 - 5 WBC/hpf   Bacteria, UA MANY (A) NONE SEEN   Squamous Epithelial / LPF 11-20 0 - 5   WBC Clumps PRESENT    Mucus PRESENT   POC occult blood, ED  Result Value Ref Range   Fecal Occult Bld NEGATIVE NEGATIVE  Type and screen [facility]  Result Value Ref Range   ABO/RH(D) A POS    Antibody Screen POS    Sample Expiration      01/31/2019 Performed at [facility] [address] [place], [place] [address]    Ct Renal Stone Study  Result Date: 01/29/2019 CLINICAL DATA:  Abdominal pain for 1 month EXAM: CT ABDOMEN AND PELVIS WITHOUT CONTRAST TECHNIQUE: Multidetector CT imaging of the abdomen and pelvis was performed following the standard protocol without IV contrast. COMPARISON:  06/20/2018 FINDINGS: Lower chest:  No contributory findings. Hepatobiliary: Hepatic steatosis and hepatic enlargement. No focal abnormality.Cholecystectomy. No bile duct  dilatation. Pancreas: Unremarkable. Spleen: Unremarkable. Adrenals/Urinary Tract: Negative adrenals. No hydronephrosis or ureteral stone. 4 mm right lower pole calculus. Unremarkable bladder. Stomach/Bowel: No obstruction. Moderate formed stool without rectal impaction or obstruction. Left colonic diverticulosis. Appendectomy. Lap band in good position. Vascular/Lymphatic: Atherosclerotic calcification of the aorta no mass or adenopathy. Reproductive:Exophytic calcified fibroid.  Negative adnexae. Other: No ascites or pneumoperitoneum. Musculoskeletal: No acute abnormalities. Spondylosis. Lumbar facet spurring and bilateral sacroiliac osteoarthritis. IMPRESSION: 1. No acute finding. 2. Moderate stool retention. 3. Hepatic steatosis and right nephrolithiasis. Electronically Signed   By: Monte Fantasia M.D.   On: 01/29/2019 05:20    Radiology No results found.  Procedures Procedures (including critical care time)  Medications Ordered in ED Medications  gentamicin (GARAMYCIN) 5 mg/kg in dextrose 5 % 50 mL IVPB (has no administration in time range)     Final Clinical Impressions(s) / ED Diagnoses   Will admit for UTI with resistance to multiple antibiotics in a patient with multiple allergies.     Kerriann Kamphuis, MD 01/29/19 [phone]

## 2019-01-29 NOTE — ED Notes (Signed)
Pt aware that urine sample is need. Stated that she just went to the bathroom but will inform staff when she needs to go.

## 2019-01-29 NOTE — H&P (Signed)
History and Physical  Sara Tran PFX:[id number] DOB: June 02, 1956 DOA: 01/29/2019  PCP: Sharilyn Sites, MD Patient coming from: Home   I have personally briefly reviewed patient's old medical records in [place]   Chief Complaint: weakness, supra-pubic pain.   HPI: Sara Tran is a 63 y.o. 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  Patient report subjective fevers,  Chills for the last several days.  She also report suprapubic pain cramping in nature, constant for the last 2 to 3 weeks.  She reports that her urine is cloudy, smelled bad.  She is also complaining of dry mouth, generalized weakness.  She vomited last week.  Her last bowel movement was day prior to admission. She was treated for urinary tract infection by  her PCP twice in the last 50-month. She denies chest pain, shortness of breath, diarrhea.  Evaluation in the ED; sodium 140 creatinine 1.4 BUN 19, white blood cell 10 hemoglobin 13, UA with more than 50 white blood cell, positive nitrate.  Fecal occult negative.  Prior urine culture from January 17 grew E. coli and Klebsiella; resistant to amoxicillin, ampicillin, Bactrim.  He was sensitive to ciprofloxacin and ceftriaxone but patient with cephalosporin allergy and allergy to Cipro.  Review of Systems: All systems reviewed and apart from history of presenting illness, are negative.  Past Medical History:  Diagnosis Date  . Adnexal cyst    Right simple cyst seen on [place]  . Anginal pain ([place])   . Anxiety   . Arthritis   . Asthma   . Atrial fibrillation (HCC)    hx of   . CHF (congestive heart failure) ([place])   . Chronic back pain   . Chronic hip pain   . COPD (chronic  obstructive pulmonary disease) ([place])   . Coronary artery disease    stents placed   . Dyspnea   . GERD (gastroesophageal reflux disease)   . Headache    due to pinched nerve damaage   . Heart murmur    hx of slight murmur   . Herpes simplex virus (HSV) infection   . History of bronchitis   . History of cardiac catheterization    Normal coronaries 2013  . History of kidney stones   . Hot flashes   . Hypertension   . Hypertriglyceridemia   . IBS (irritable bowel syndrome)   . Internal hemorrhoids   . Morbid obesity ([place])    s/p lap band surgery  . Myocardial infarction ([place])    times 3  . Obesity   . Pulmonary HTN ([place])    [place] 2008:  RA pressures 13/10 with a mean of 7 mmHg.  RV pressure 32/99 with end diastolic pressure of 15 mmHg.  PA pressure 49/23 with a mean of 35 mmHg.  Pulmonary capillary wedge 17/50 with a mean of 14 mmHg. The PA saturation was 68%.  RA saturation was 71% and aortic saturation was 90%.  Cardiac output was 6.0 with a cardiac index of 2.80 by Fick.  Normal coronaries by cath 2008.  Marland Kitchen Renal insufficiency   . Sciatica of right side   . Sleep apnea    CPAP use does not know settings   . Type 2 diabetes mellitus ([place])  type II   . Uterine fibroid    Past Surgical History:  Procedure Laterality Date  . APPENDECTOMY    . CARDIAC CATHETERIZATION  07/2007, 05/2012   Normal coronary arteries  . COLONOSCOPY  10/18/10   SLF:2-mm sessile sigmoid polyp/diverticula  . CYSTOSCOPY/URETEROSCOPY/HOLMIUM LASER/STENT PLACEMENT Right 08/01/2017   Procedure: CYSTOSCOPY/ RIGHT URETEROSCOPY/ RIGHT RETROGRADE/ HOLMIUM LASER  AND RIGHT STENT PLACEMENT FIRST STAGE;  Surgeon: Irine Seal, MD;  Location: [facility];  Service: Urology;  Laterality: Right;  . CYSTOSCOPY/URETEROSCOPY/HOLMIUM LASER/STENT PLACEMENT Right 08/08/2017   Procedure: RIGHT URETEROSCOPY WITH HOLMIUM LASER RIGHT STENT PLACEMENT;  Surgeon: Irine Seal, MD;  Location: [facility];  Service: Urology;  Laterality: Right;  .  EXTRACORPOREAL SHOCK WAVE LITHOTRIPSY Right 08/22/2017   Procedure: RIGHT EXTRACORPOREAL SHOCK WAVE LITHOTRIPSY (ESWL);  Surgeon: Alexis Frock, MD;  Location: [facility];  Service: Urology;  Laterality: Right;  . HEMORRHOID BANDING  07/2012   Dr. Oneida Alar  . LAPAROSCOPIC CHOLECYSTECTOMY  1985  . LAPAROSCOPIC GASTRIC BANDING  12/12/2010  . LEFT AND RIGHT HEART CATHETERIZATION WITH CORONARY ANGIOGRAM N/A 06/03/2012   Procedure: LEFT AND RIGHT HEART CATHETERIZATION WITH CORONARY ANGIOGRAM;  Surgeon: Jolaine Artist, MD;  Location[facility] C[facility] LAB;  Service: Cardiovascular;  Laterality: N/A;  . PILONIDAL CYST EXCISION  1974  . 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 TUMOR EXCISION  10/2011   Face   Social History:  reports that she quit smoking about 22 years ago. Her smoking use included cigarettes. She has never used smokeless tobacco. She reports that she does not drink alcohol or use drugs.   Allergies  Allergen Reactions  . Bee Venom Anaphylaxis    As a child  . Ancef [Cefazolin] Other (See Comments)    Patient was told she was allergic but doesn't remember what happens  . Aspirin Other (See Comments)    Rectal bleeding  . Ceftin [Cefuroxime Axetil] Swelling    Swollen tongue, "made me crazy"  . Ciprofloxacin Cough    Coughed up blood  . Macrodantin [Nitrofurantoin] Other (See Comments)    Vomited blood and had dark stool.  . Milk-Related Compounds Other (See Comments)    IBS  . Other     Snake venom - not sure of reaction -had problems with feet   . Latex Itching and Rash    Family History  Problem Relation Age of Onset  . Diabetes Mother   . Heart failure Mother   . Breast cancer Sister   . CAD Father   . Hypertension Father   . Cancer Paternal Grandfather   . Hypertension Paternal Grandmother   . Stroke Paternal Grandmother   . Other Maternal Grandmother        tonsilitis  . Diabetes Maternal Grandfather   . Hypertension Maternal Grandfather   . Breast cancer Paternal  Aunt     Prior to Admission medications   Medication Sig Start Date End Date Taking? Authorizing Provider  albuterol (PROVENTIL HFA;VENTOLIN HFA) 108 (90 BASE) MCG/ACT inhaler Inhale 2 puffs into the lungs every 6 (six) hours as needed. FOR SHORTNESS OF BREATH/WHEEZING   Yes [provider]  albuterol (PROVENTIL) (2.5 MG/3ML) 0.083% nebulizer solution Take 2.5 mg by nebulization every 4 (four) hours as needed for wheezing or shortness of breath.  09/19/15  Yes [provider]  amLODipine (NORVASC) 10 MG tablet Take 1 tablet by mouth every evening.  04/10/13  Yes [provider]  baclofen (LIORESAL) 10 MG tablet Take 1 tablet (  10 mg total) by mouth 3 (three) times daily as needed for muscle spasms. 08/13/18  Yes Meredith Staggers, MD  fenofibrate 160 MG tablet Take 160 mg by mouth daily. 07/21/15  Yes [provider]  fluticasone (FLONASE) 50 MCG/ACT nasal spray Place 1-2 sprays into the nose daily.    Yes [provider]  gabapentin (NEURONTIN) 300 MG capsule Take 600 mg by mouth 3 (three) times daily.    Yes [provider]  isosorbide mononitrate (IMDUR) 30 MG 24 hr tablet Take 30 mg by mouth every morning.  09/05/11  Yes [provider]  loratadine (CLARITIN) 10 MG tablet Take 1 tablet by mouth daily as needed for allergies.  05/27/17  Yes [provider]  magnesium oxide (MAG-OX) 400 MG tablet Take 400 mg by mouth 2 (two) times daily.    Yes [provider]  metFORMIN (GLUCOPHAGE-XR) 500 MG 24 hr tablet Take 1 tablet by mouth as needed (for BS).  05/20/17  Yes [provider]  Multiple Vitamins-Minerals (ONE-A-DAY 50 PLUS PO) Take 1 tablet by mouth daily.   Yes [provider]  nitroGLYCERIN (NITROSTAT) 0.4 MG SL tablet Place 0.4 mg under the tongue every 5 (five) minutes as needed. Reported on 06/14/2016   Yes [provider]  omega-3 acid ethyl esters (LOVAZA) 1 g capsule Take 1 g by mouth 2  (two) times daily.    Yes [provider]  oxyCODONE-acetaminophen (PERCOCET) 10-325 MG tablet Take 1 tablet by mouth 4 (four) times daily as needed for pain.  12/06/18  Yes [provider]  potassium chloride SA (K-DUR,KLOR-CON) 20 MEQ tablet Take 20 mEq by mouth every morning.    Yes [provider]  TRULICITY 1.5 YT/0.1SW SOPN Inject 1.5 mg into the skin every 7 (seven) days. 01/03/19  Yes [provider]  amoxicillin-clavulanate (AUGMENTIN) 875-125 MG tablet Take 1 tablet by mouth every 12 (twelve) hours. Patient not taking: Reported on 01/29/2019 06/20/18   Lily Kocher, PA-C  Dulaglutide (TRULICITY) 1.09 NA/3.5TD SOPN Inject 0.75 mg into the skin once a week. Patient not taking: Reported on 01/29/2019 01/17/18   Renato Shin, MD  losartan (COZAAR) 50 MG tablet Take 1 tablet (50 mg total) by mouth daily. Patient not taking: Reported on 01/29/2019 10/28/17 01/26/18  Jolaine Artist, MD   Physical Exam: Vitals:   01/28/19 2213 01/29/19 0154 01/29/19 0541 01/29/19 0615  BP: (!) 164/72 (!) 160/83  (!) 154/70  Pulse: 99 85  77  Resp: 16 16  17   Temp: 99.1 F (37.3 C) 98.7 F (37.1 C)    TempSrc: Oral Oral    SpO2: 94% 96%  96%  Weight:   122.2 kg   Height:   4\' 11"  (1.499 m)      General exam: Moderately built and nourished patient, lying comfortably supine on the gurney in no obvious distress.  Head, eyes and ENT: Nontraumatic and normocephalic. Pupils equally reacting to light and accommodation. Oral mucosa moist.  Neck: Supple. No JVD, carotid bruit or thyromegaly.  Lymphatics: No lymphadenopathy.  Respiratory system: Clear to auscultation. No increased work of breathing.  Cardiovascular system: S1 and S2 heard, RRR. No JVD, murmurs, gallops, clicks or pedal edema.  Gastrointestinal system: Bowel sounds present, soft, obese abdomen, suprapubic tenderness on palpation and positive CVA bilaterally.  Central nervous system: Alert and  oriented. No focal neurological deficits.  Extremities: Symmetric 5 x 5 power. Peripheral pulses symmetrically felt.   Skin: No rashes or acute  findings.  Musculoskeletal system: Negative exam.  Psychiatry: Pleasant and cooperative.   Labs on Admission:  Basic Metabolic Panel: Recent Labs  Lab 01/28/19 2021  NA 140  K 4.3  CL 105  CO2 26  GLUCOSE 108*  BUN 19  CREATININE 1.43*  CALCIUM 9.6   Liver Function Tests: Recent Labs  Lab 01/28/19 2021  AST 25  ALT 23  ALKPHOS 53  BILITOT 0.7  PROT 7.7  ALBUMIN 4.2   No results for input(s): LIPASE, AMYLASE in the last 168 hours. No results for input(s): AMMONIA in the last 168 hours. CBC: Recent Labs  Lab 01/28/19 2021  WBC 10.1  HGB 13.0  HCT 40.7  MCV 99.0  PLT 323   Cardiac Enzymes: No results for input(s): CKTOTAL, CKMB, CKMBINDEX, TROPONINI in the last 168 hours.  BNP (last 3 results) No results for input(s): PROBNP in the last 8760 hours. CBG: No results for input(s): GLUCAP in the last 168 hours.  Radiological Exams on Admission: Ct Renal Stone Study  Result Date: 01/29/2019 CLINICAL DATA:  Abdominal pain for 1 month EXAM: CT ABDOMEN AND PELVIS WITHOUT CONTRAST TECHNIQUE: Multidetector CT imaging of the abdomen and pelvis was performed following the standard protocol without IV contrast. COMPARISON:  06/20/2018 FINDINGS: Lower chest:  No contributory findings. Hepatobiliary: Hepatic steatosis and hepatic enlargement. No focal abnormality.Cholecystectomy. No bile duct dilatation. Pancreas: Unremarkable. Spleen: Unremarkable. Adrenals/Urinary Tract: Negative adrenals. No hydronephrosis or ureteral stone. 4 mm right lower pole calculus. Unremarkable bladder. Stomach/Bowel: No obstruction. Moderate formed stool without rectal impaction or obstruction. Left colonic diverticulosis. Appendectomy. Lap band in good position. Vascular/Lymphatic: Atherosclerotic calcification of the aorta no mass or adenopathy.  Reproductive:Exophytic calcified fibroid.  Negative adnexae. Other: No ascites or pneumoperitoneum. Musculoskeletal: No acute abnormalities. Spondylosis. Lumbar facet spurring and bilateral sacroiliac osteoarthritis. IMPRESSION: 1. No acute finding. 2. Moderate stool retention. 3. Hepatic steatosis and right nephrolithiasis. Electronically Signed   By: Monte Fantasia M.D.   On: 01/29/2019 05:20      Assessment/Plan Active Problems:   Pulmonary hypertension (HCC)   Chronic diastolic heart failure (HCC)   Morbid obesity (HCC)   Diabetes mellitus (HCC)   Chronic pain syndrome   UTI (urinary tract infection)   Complicated UTI (urinary tract infection)   1-Complicated UTI; patient present with suprapubic pain, dehydration, nausea, CVA tenderness.  CT showing small nonobstructive kidney stone 4 mm.  Patient with multiple allergies to antibiotics, and prior urine culture with multiple drug resistant bacteria. Admit to the hospital for IV antibiotics. Follow urine culture results.  We will continue with gentamicin at this time. If no further oral antibiotics  option could  consider trial of ciprofloxacin, allergy reaction was a specified as  Hemoptysis.   2-;Diabetes; Check hemoglobin A1c. Sliding scale insulin. Hold metformin. Patient on weekly Trulicity.  3-Chronic diastolic heart failure; appears compensated.  Appears dehydrated.  IV fluids for 10 hours. Continue with Imdur, PRN nitroglycerin Patient has not been  taking Cozaar, this medication was discontinued.Marland Kitchen  4-Chronic pain; Continue with gabapentin, Percocet, Profen.Marland Kitchen  5-COPD: Continue with nebulizer as needed.  With Claritin, Flonase.  6-Hypertension: Continue with Norvasc, Imdur.  7-history of coronary artery disease; and with nitroglycerin as needed.  Currently denies chest pain.  8-chronic kidney disease a stage III; Creatinine better than previously. Prior creatinine at 1.6.   9-constipation; start Senokot and  MiraLAX.Marland Kitchen   DVT Prophylaxis: Lovenox Code Status: Full code Family Communication: Care discussed with patient Disposition Plan: Admit under observation for treatment  of UTI, in a patient with multiple antibiotics allergy, and multi-resistant bacteria on culture.  Will need to await results of urine culture this admission to tailor antibiotic.  Time spent: 75 minutes.   Elmarie Shiley MD [facility]   If 7PM-7AM, please contact night-coverage [email or web address] Password [facility] [place] [facility]  01/29/2019, 7:49 AM

## 2019-01-29 NOTE — Progress Notes (Signed)
Pt stable with no needs at this time. No s/s of distress. Pt still c/o mild back pain. Pt does have chronic pain. Assessment performed at this time.

## 2019-01-29 NOTE — Progress Notes (Signed)
Call made to ED again to attempt to get report on patient. Spoke with [facility] who gave report on patient. Patient being sent "soon" to unit, per [facility]. Donne Hazel, RN

## 2019-01-29 NOTE — Progress Notes (Signed)
Call made to ED to attempt to get report on patient. RN is not able to speak with me. Will call back soon. Donne Hazel, RN

## 2019-01-30 DIAGNOSIS — J150 Pneumonia due to Klebsiella pneumoniae: Secondary | ICD-10-CM | POA: Diagnosis present

## 2019-01-30 DIAGNOSIS — N390 Urinary tract infection, site not specified: Secondary | ICD-10-CM

## 2019-01-30 DIAGNOSIS — I272 Pulmonary hypertension, unspecified: Secondary | ICD-10-CM

## 2019-01-30 DIAGNOSIS — E119 Type 2 diabetes mellitus without complications: Secondary | ICD-10-CM

## 2019-01-30 DIAGNOSIS — B9620 Unspecified Escherichia coli [E. coli] as the cause of diseases classified elsewhere: Secondary | ICD-10-CM | POA: Diagnosis present

## 2019-01-30 DIAGNOSIS — N3001 Acute cystitis with hematuria: Secondary | ICD-10-CM

## 2019-01-30 LAB — GLUCOSE, CAPILLARY
Glucose-Capillary: 152 mg/dL — ABNORMAL HIGH (ref 70–99)
Glucose-Capillary: 147 mg/dL — ABNORMAL HIGH (ref 70–99)
Glucose-Capillary: 112 mg/dL — ABNORMAL HIGH (ref 70–99)
Glucose-Capillary: 144 mg/dL — ABNORMAL HIGH (ref 70–99)

## 2019-01-30 LAB — HIV ANTIBODY (ROUTINE TESTING W REFLEX): HIV Screen 4th Generation wRfx: NONREACTIVE

## 2019-01-30 LAB — CBC
HCT: 37.2 % (ref 36.0–46.0)
Hemoglobin: 11.8 g/dL — ABNORMAL LOW (ref 12.0–15.0)
MCH: 31.6 pg (ref 26.0–34.0)
MCHC: 31.7 g/dL (ref 30.0–36.0)
MCV: 99.7 fL (ref 80.0–100.0)
Platelets: 253 10*3/uL (ref 150–400)
RBC: 3.73 MIL/uL — ABNORMAL LOW (ref 3.87–5.11)
RDW: 12.5 % (ref 11.5–15.5)
WBC: 6.6 10*3/uL (ref 4.0–10.5)
nRBC: 0 % (ref 0.0–0.2)

## 2019-01-30 LAB — BASIC METABOLIC PANEL
Anion gap: 7 (ref 5–15)
BUN: 19 mg/dL (ref 8–23)
CO2: 24 mmol/L (ref 22–32)
Calcium: 8.9 mg/dL (ref 8.9–10.3)
Chloride: 108 mmol/L (ref 98–111)
Creatinine, Ser: 1.44 mg/dL — ABNORMAL HIGH (ref 0.44–1.00)
GFR calc Af Amer: 45 mL/min — ABNORMAL LOW (ref >60)
GFR calc non Af Amer: 39 mL/min — ABNORMAL LOW (ref >60)
Glucose, Bld: 106 mg/dL — ABNORMAL HIGH (ref 70–99)
Potassium: 4.1 mmol/L (ref 3.5–5.1)
Sodium: 139 mmol/L (ref 135–145)

## 2019-01-30 MED ORDER — CIPROFLOXACIN HCL 500 MG PO TABS
500.0000 mg | ORAL_TABLET | Freq: Two times a day (BID) | ORAL | Status: DC
  Administered 2019-01-30 – 2019-01-31 (×2): 500 mg via ORAL
  Filled 2019-01-30 (×3): qty 1

## 2019-01-30 NOTE — Progress Notes (Signed)
PROGRESS NOTE    Sara Tran  [id number] DOB: 1956/05/23 DOA: 01/29/2019 PCP: Sharilyn Sites, MD    Brief Narrative:   Sara Tran is a 63 y.o. 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  Patient report subjective fevers,  Chills for the last several days.  She also report suprapubic pain cramping in nature, constant for the last 2 to 3 weeks.  She reports that her urine is cloudy, smelled bad.  She is also complaining of dry mouth, generalized weakness.  She vomited last week.  Her last bowel movement was day prior to admission. She was treated for urinary tract infection by  her PCP twice in the last 10-month. She denies chest pain, shortness of breath, diarrhea.  Evaluation in the ED; sodium 140 creatinine 1.4 BUN 19, white blood cell 10 hemoglobin 13, UA with more than 50 white blood cell, positive nitrate.  Fecal occult negative.  Prior urine culture from January 17 grew E. coli and Klebsiella; resistant to amoxicillin, ampicillin, Bactrim.  He was sensitive to ciprofloxacin and ceftriaxone but patient with cephalosporin allergy and allergy to Cipro.   Assessment & Plan:   Active Problems:   Pulmonary hypertension (HCC)   Chronic diastolic heart failure (HCC)   Morbid obesity (HCC)   Diabetes mellitus (HCC)   Chronic pain syndrome   UTI (urinary tract infection)   Complicated UTI (urinary tract infection)   E. coli/Klebsiella pneumonia UTI Patient presenting with weakness, suprapubic pain and dehydration associated with nausea.  CT abdomen/pelvis notable for small nonobstructive kidney stone that is 4 mm.  Patient with multiple allergies to antibiotics and prior urine culture with  multidrug-resistant bacteria.  Admitted to the hospital for IV antibiotics. --Remains afebrile without leukocytosis --Urine culture positive for E. coli/Klebsiella with good susceptibility to ciprofloxacin, review of allergies noted allergy to Cipro reported as coughing up blood, queried patient she is unable to remember this and does not believe a true allergy --Continues on IV gentamicin, will give 1 more dose tomorrow morning --Start Cipro 500 mg p.o. twice daily tonight and evaluate if any side effects from this medication, otherwise we will continue for a 7-day course. --Repeat CBC in a.m.  Type 2 diabetes mellitus Hemoglobin A1c 6.3.  Well controlled. --Continue to hold home metformin while inpatient; also on weekly Trulicity outpatient --Insulin sliding scale for coverage  Chronic compensated diastolic congestive heart failure Essential hypertension Coronary artery disease Initially appeared dehydrated due to UTI as above.  Received gentle IV fluid hydration. --Continue Imdur and Norvasc --Nitro PRN  History of COPD --Nebs as needed  Chronic pain syndrome: Continue gabapentin, Percocet, ibuprofen  CKD stage III --Creatinine stable, 1.44 today --Renally dose all medications --Continue to monitor renal function daily  Sedation: Senokot and MiraLAX    DVT prophylaxis: Lovenox Code Status: Full code Family Communication: None Disposition Plan: Likely home in 1-2 days   Consultants:   None  Procedures:   None  Antimicrobials:  Gentamicin 1/30 - 2/1  Ciprofloxacin 1/31 -    Subjective:  Patient seen and examined at bedside, lying comfortably.  No complaints.  Discussed with patient that she has E. coli and Klebsiella pneumonia in her urine that is susceptible to ciprofloxacin.  Asked regarding side effects, she is  unable to clarify much but may have coughed up a little bit of blood previously but that may have been related to something else.  Discussed with  her transitioning off of IV gentamicin to ciprofloxacin, she seems hesitant at this time and request 1 additional dose of gentamicin before that transition.  Discussed with her that will give her 1 more dose of gentamicin tomorrow morning and start ciprofloxacin this evening to ensure no side effects during her observation while inpatient.  No other complaints at this time.  Denies headache, no fever/chills/night sweats, no chest pain, shortness of breath, no abdominal pain, no weakness, no cough/congestion, no nausea/vomiting/diarrhea.  No acute events overnight per nursing staff.  Objective: Vitals:   01/29/19 1321 01/29/19 2205 01/30/19 0519 01/30/19 1301  BP: (!) 149/78 (!) 161/76 (!) 150/74 (!) 161/71  Pulse: 89 81 71 85  Resp: 15 16 16 16   Temp: 98.1 F (36.7 C) 97.6 F (36.4 C) (!) 97.5 F (36.4 C) (!) 97.4 F (36.3 C)  TempSrc: Oral Oral Oral Oral  SpO2: 95% 99% 98% 100%  Weight:      Height:        Intake/Output Summary (Last 24 hours) at 01/30/2019 1738 Last data filed at 01/30/2019 1736 Gross per 24 hour  Intake 860 ml  Output 1900 ml  Net -1040 ml   Filed Weights   01/29/19 0541  Weight: 122.2 kg    Examination:  General exam: Appears calm and comfortable  Respiratory system: Clear to auscultation. Respiratory effort normal. Cardiovascular system: S1 & S2 heard, RRR. No JVD, murmurs, rubs, gallops or clicks. No pedal edema. Gastrointestinal system: Abdomen is nondistended, soft and nontender. No organomegaly or masses felt. Normal bowel sounds heard. Central nervous system: Alert and oriented. No focal neurological deficits. Extremities: Symmetric 5 x 5 power. Skin: No rashes, lesions or ulcers Psychiatry: Judgement and insight appear normal. Mood & affect appropriate.     Data Reviewed: I have personally reviewed following labs and imaging studies  CBC: Recent Labs  Lab 01/28/19 2021 01/30/19 0440  WBC 10.1 6.6  HGB 13.0 11.8*  HCT 40.7 37.2  MCV 99.0  99.7  PLT 323 185   Basic Metabolic Panel: Recent Labs  Lab 01/28/19 2021 01/30/19 0440  NA 140 139  K 4.3 4.1  CL 105 108  CO2 26 24  GLUCOSE 108* 106*  BUN 19 19  CREATININE 1.43* 1.44*  CALCIUM 9.6 8.9   GFR: Estimated Creatinine Clearance: 48.4 mL/min (A) (by C-G formula based on SCr of 1.44 mg/dL (H)). Liver Function Tests: Recent Labs  Lab 01/28/19 2021  AST 25  ALT 23  ALKPHOS 53  BILITOT 0.7  PROT 7.7  ALBUMIN 4.2   No results for input(s): LIPASE, AMYLASE in the last 168 hours. No results for input(s): AMMONIA in the last 168 hours. Coagulation Profile: No results for input(s): INR, PROTIME in the last 168 hours. Cardiac Enzymes: No results for input(s): CKTOTAL, CKMB, CKMBINDEX, TROPONINI in the last 168 hours. BNP (last 3 results) No results for input(s): PROBNP in the last 8760 hours. HbA1C: Recent Labs    01/28/19 2021  HGBA1C 6.3*   CBG: Recent Labs  Lab 01/29/19 1557 01/29/19 2208 01/30/19 0743 01/30/19 1128 01/30/19 1641  GLUCAP 144* 124* 152* 147* 112*   Lipid Profile: No results for input(s): CHOL, HDL, LDLCALC, TRIG, CHOLHDL, LDLDIRECT in the last 72 hours. Thyroid Function Tests: No results for input(s): TSH, T4TOTAL, FREET4, T3FREE, THYROIDAB in the last 72  hours. Anemia Panel: No results for input(s): VITAMINB12, FOLATE, FERRITIN, TIBC, IRON, RETICCTPCT in the last 72 hours. Sepsis Labs: No results for input(s): PROCALCITON, LATICACIDVEN in the last 168 hours.  Recent Results (from the past 240 hour(s))  Urine culture     Status: Abnormal (Preliminary result)   Collection Time: 01/29/19  3:53 AM  Result Value Ref Range Status   Specimen Description URINE, CLEAN CATCH  Final   Special Requests   Final    Normal Performed at [facility] [address], [place], [place] [address]    Culture >=100,000 COLONIES/mL GRAM NEGATIVE RODS (A)  Final   Report Status PENDING  Incomplete         Radiology  Studies: Ct Renal Stone Study  Result Date: 01/29/2019 CLINICAL DATA:  Abdominal pain for 1 month EXAM: CT ABDOMEN AND PELVIS WITHOUT CONTRAST TECHNIQUE: Multidetector CT imaging of the abdomen and pelvis was performed following the standard protocol without IV contrast. COMPARISON:  06/20/2018 FINDINGS: Lower chest:  No contributory findings. Hepatobiliary: Hepatic steatosis and hepatic enlargement. No focal abnormality.Cholecystectomy. No bile duct dilatation. Pancreas: Unremarkable. Spleen: Unremarkable. Adrenals/Urinary Tract: Negative adrenals. No hydronephrosis or ureteral stone. 4 mm right lower pole calculus. Unremarkable bladder. Stomach/Bowel: No obstruction. Moderate formed stool without rectal impaction or obstruction. Left colonic diverticulosis. Appendectomy. Lap band in good position. Vascular/Lymphatic: Atherosclerotic calcification of the aorta no mass or adenopathy. Reproductive:Exophytic calcified fibroid.  Negative adnexae. Other: No ascites or pneumoperitoneum. Musculoskeletal: No acute abnormalities. Spondylosis. Lumbar facet spurring and bilateral sacroiliac osteoarthritis. IMPRESSION: 1. No acute finding. 2. Moderate stool retention. 3. Hepatic steatosis and right nephrolithiasis. Electronically Signed   By: Monte Fantasia M.D.   On: 01/29/2019 05:20        Scheduled Meds: . amLODipine  10 mg Oral QPM  . ciprofloxacin  500 mg Oral BID  . enoxaparin (LOVENOX) injection  40 mg Subcutaneous Q24H  . fenofibrate  160 mg Oral Daily  . fluticasone  1-2 spray Each Nare Daily  . gabapentin  600 mg Oral TID  . insulin aspart  0-9 Units Subcutaneous TID WC  . isosorbide mononitrate  30 mg Oral Daily  . magnesium oxide  400 mg Oral BID  . omega-3 acid ethyl esters  1 g Oral BID  . polyethylene glycol  17 g Oral BID  . senna  1 tablet Oral BID   Continuous Infusions: . gentamicin 400 mg (01/30/19 0555)     LOS: 1 day    Time spent: 28 min    Ares Tegtmeyer J [place] DO [facility] Pager [phone]  If 7PM-7AM, please contact night-coverage [email or web address] Password [facility] 01/30/2019, 5:38 PM

## 2019-01-30 NOTE — Progress Notes (Signed)
   01/30/19 1059  Clinical Encounter Type  Visited With Patient  Visit Type Initial;Other (Comment) (AD)  Referral From Nurse  Consult/Referral To Chaplain  The chaplain followed up on Pt. progress on completing her AD. At this time the Pt. has completed AD education and has the documents in her room waiting to notarized.  The chaplain will contact the notary for F/U today. The chaplain learned the Pt. has very clear goals for end of life care which she reflected in her AD. The Pt. is aware of how to contact a chaplain if needed.  The Pt. has an interest in discussing DNR status. The chaplain suggested F/U with her physician.  The chaplain and Pt. prayed together before the chaplain exited the room.

## 2019-01-31 DIAGNOSIS — B9620 Unspecified Escherichia coli [E. coli] as the cause of diseases classified elsewhere: Secondary | ICD-10-CM

## 2019-01-31 DIAGNOSIS — N390 Urinary tract infection, site not specified: Principal | ICD-10-CM

## 2019-01-31 LAB — BASIC METABOLIC PANEL
Anion gap: 9 (ref 5–15)
BUN: 19 mg/dL (ref 8–23)
CO2: 26 mmol/L (ref 22–32)
Calcium: 8.9 mg/dL (ref 8.9–10.3)
Chloride: 104 mmol/L (ref 98–111)
Creatinine, Ser: 1.45 mg/dL — ABNORMAL HIGH (ref 0.44–1.00)
GFR calc Af Amer: 45 mL/min — ABNORMAL LOW (ref >60)
GFR calc non Af Amer: 39 mL/min — ABNORMAL LOW (ref >60)
Glucose, Bld: 168 mg/dL — ABNORMAL HIGH (ref 70–99)
Potassium: 3.9 mmol/L (ref 3.5–5.1)
Sodium: 139 mmol/L (ref 135–145)

## 2019-01-31 LAB — GLUCOSE, CAPILLARY
Glucose-Capillary: 121 mg/dL — ABNORMAL HIGH (ref 70–99)
Glucose-Capillary: 129 mg/dL — ABNORMAL HIGH (ref 70–99)
Glucose-Capillary: 117 mg/dL — ABNORMAL HIGH (ref 70–99)
Glucose-Capillary: 159 mg/dL — ABNORMAL HIGH (ref 70–99)

## 2019-01-31 LAB — CBC
HCT: 36.6 % (ref 36.0–46.0)
Hemoglobin: 11.6 g/dL — ABNORMAL LOW (ref 12.0–15.0)
MCH: 31.3 pg (ref 26.0–34.0)
MCHC: 31.7 g/dL (ref 30.0–36.0)
MCV: 98.7 fL (ref 80.0–100.0)
Platelets: 239 10*3/uL (ref 150–400)
RBC: 3.71 MIL/uL — ABNORMAL LOW (ref 3.87–5.11)
RDW: 12.6 % (ref 11.5–15.5)
WBC: 6.3 10*3/uL (ref 4.0–10.5)
nRBC: 0 % (ref 0.0–0.2)

## 2019-01-31 LAB — URINE CULTURE
Culture: >=100000 — AB
Special Requests: NORMAL

## 2019-01-31 NOTE — Progress Notes (Signed)
I have reviewed and concur with this student's documentation.   

## 2019-01-31 NOTE — Progress Notes (Signed)
PROGRESS NOTE    [name]  PYK:[id number] DOB: 21-Mar-1956 DOA: 01/29/2019 PCP: Sharilyn Sites, MD    Brief Narrative:   Sara Tran is a 63 y.o. 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  Patient report subjective fevers,  Chills for the last several days.  She also report suprapubic pain cramping in nature, constant for the last 2 to 3 weeks.  She reports that her urine is cloudy, smelled bad.  She is also complaining of dry mouth, generalized weakness.  She vomited last week.  Her last bowel movement was day prior to admission. She was treated for urinary tract infection by  her PCP twice in the last 70-month. She denies chest pain, shortness of breath, diarrhea.  Evaluation in the ED; sodium 140 creatinine 1.4 BUN 19, white blood cell 10 hemoglobin 13, UA with more than 50 white blood cell, positive nitrate.  Fecal occult negative.  Prior urine culture from January 17 grew E. coli and Klebsiella; resistant to amoxicillin, ampicillin, Bactrim.  He was sensitive to ciprofloxacin and ceftriaxone but patient with cephalosporin allergy and allergy to Cipro.  01/31/2019: Patient seen.  Patient is not keen on being discharged back on.  Patient reports frothy and yellowish cough!  Patient wants to be monitored overnight while on Cipro.  Hopefully, patient will agree to be discharged back on tomorrow morning.   Assessment & Plan:   Active Problems:   Pulmonary hypertension (HCC)   Chronic diastolic heart failure (HCC)   Morbid obesity (HCC)   Diabetes mellitus (HCC)   Chronic pain syndrome   UTI (urinary tract infection)   Complicated UTI (urinary tract infection)   Klebsiella pneumonia (HCC)   E.  coli UTI (urinary tract infection)   E. coli/Klebsiella pneumonia UTI Patient presenting with weakness, suprapubic pain and dehydration associated with nausea.  CT abdomen/pelvis notable for small nonobstructive kidney stone that is 4 mm.  Patient with multiple allergies to antibiotics and prior urine culture with multidrug-resistant bacteria.  Admitted to the hospital for IV antibiotics. --Remains afebrile without leukocytosis --Urine culture positive for E. coli/Klebsiella with good susceptibility to ciprofloxacin, review of allergies noted allergy to Cipro reported as coughing up blood, queried patient she is unable to remember this and does not believe a true allergy --Continues on IV gentamicin, will give 1 more dose tomorrow morning --Start Cipro 500 mg p.o. twice daily tonight and evaluate if any side effects from this medication, otherwise we will continue for a 7-day course. --Repeat CBC in a.m. 01/31/2019: Continue Cipro.  Likely discharge home tomorrow.  Type 2 diabetes mellitus Hemoglobin A1c 6.3.  Well controlled. --Continue to hold home metformin while inpatient; also on weekly Trulicity outpatient --Insulin sliding scale for coverage  Chronic compensated diastolic congestive heart failure Essential hypertension Coronary artery disease Initially appeared dehydrated due to UTI as above.  Received gentle IV fluid hydration. --Continue Imdur and Norvasc --Nitro PRN  History of COPD --Nebs as needed  Chronic pain syndrome: Continue gabapentin, Percocet, ibuprofen  CKD stage III --Creatinine stable, 1.44 today --Renally dose all medications --Continue to monitor renal function daily  Sedation: Senokot and MiraLAX    DVT prophylaxis: Lovenox Code Status: Full code Family Communication: None Disposition Plan: Likely home in 1-2 days  Consultants:   None  Procedures:   None  Antimicrobials:  Gentamicin 1/30 - 2/1  Ciprofloxacin 1/31 -     Subjective: Patient seen.  Reports for cannulation cough.  Wants to be monitored in the hospital overnight.  Objective: Vitals:   01/30/19 2156 01/31/19 0517 01/31/19 1355 01/31/19 1536  BP: (!) 150/72 (!) 154/74 (!) 171/79 (!) 149/71  Pulse: 76 75 80 85  Resp: 16 16    Temp: (!) 97.4 F (36.3 C) 97.7 F (36.5 C) 98 F (36.7 C) 98.1 F (36.7 C)  TempSrc: Oral Oral Oral Oral  SpO2: 97% 98% 99% 99%  Weight:      Height:        Intake/Output Summary (Last 24 hours) at 01/31/2019 1849 Last data filed at 01/31/2019 1800 Gross per 24 hour  Intake 1020 ml  Output 1700 ml  Net -680 ml   Filed Weights   01/29/19 0541  Weight: 122.2 kg    Examination:  General exam: Appears calm and comfortable.  Patient is morbidly obese. Respiratory system: Clear to auscultation.  Cardiovascular system: S1 & S2 heard Gastrointestinal system: Abdomen is morbidly obese, nontender.  Organs are difficult to assess.   Central nervous system: Alert and oriented. No focal neurological deficits.   Data Reviewed: I have personally reviewed following labs and imaging studies  CBC: Recent Labs  Lab 01/28/19 2021 01/30/19 0440 01/31/19 0428  WBC 10.1 6.6 6.3  HGB 13.0 11.8* 11.6*  HCT 40.7 37.2 36.6  MCV 99.0 99.7 98.7  PLT 323 253 283   Basic Metabolic Panel: Recent Labs  Lab 01/28/19 2021 01/30/19 0440 01/31/19 0428  NA 140 139 139  K 4.3 4.1 3.9  CL 105 108 104  CO2 26 24 26   GLUCOSE 108* 106* 168*  BUN 19 19 19   CREATININE 1.43* 1.44* 1.45*  CALCIUM 9.6 8.9 8.9   GFR: Estimated Creatinine Clearance: 48.1 mL/min (A) (by C-G formula based on SCr of 1.45 mg/dL (H)). Liver Function Tests: Recent Labs  Lab 01/28/19 2021  AST 25  ALT 23  ALKPHOS 53  BILITOT 0.7  PROT 7.7  ALBUMIN 4.2   No results for input(s): LIPASE, AMYLASE in the last 168 hours. No results for input(s): AMMONIA in the last 168 hours. Coagulation Profile: No results for input(s): INR, PROTIME in the  last 168 hours. Cardiac Enzymes: No results for input(s): CKTOTAL, CKMB, CKMBINDEX, TROPONINI in the last 168 hours. BNP (last 3 results) No results for input(s): PROBNP in the last 8760 hours. HbA1C: Recent Labs    01/28/19 2021  HGBA1C 6.3*   CBG: Recent Labs  Lab 01/30/19 1128 01/30/19 1641 01/30/19 2157 01/31/19 0750 01/31/19 1226  GLUCAP 147* 112* 144* 121* 129*   Lipid Profile: No results for input(s): CHOL, HDL, LDLCALC, TRIG, CHOLHDL, LDLDIRECT in the last 72 hours. Thyroid Function Tests: No results for input(s): TSH, T4TOTAL, FREET4, T3FREE, THYROIDAB in the last 72 hours. Anemia Panel: No results for input(s): VITAMINB12, FOLATE, FERRITIN, TIBC, IRON, RETICCTPCT in the last 72 hours. Sepsis Labs: No results for input(s): PROCALCITON, LATICACIDVEN in the last 168 hours.  Recent Results (from the past 240 hour(s))  Urine culture     Status: Abnormal   Collection Time: 01/29/19  3:53 AM  Result Value Ref Range Status   Specimen Description   Final    URINE, CLEAN CATCH Performed at [facility], [place] [address] [place], [place] [address]    Special Requests   Final  Normal Performed at [facility], [place] [address] [place], [place] [address]    Culture (A)  Final    >=100,000 COLONIES/mL ESCHERICHIA COLI >=100,000 COLONIES/mL KLEBSIELLA PNEUMONIAE    Report Status 01/31/2019 FINAL  Final   Organism ID, Bacteria ESCHERICHIA COLI (A)  Final   Organism ID, Bacteria KLEBSIELLA PNEUMONIAE (A)  Final      Susceptibility   Escherichia coli - MIC*    AMPICILLIN >=32 RESISTANT Resistant     CEFAZOLIN 8 SENSITIVE Sensitive     CEFTRIAXONE <=1 SENSITIVE Sensitive     CIPROFLOXACIN <=0.25 SENSITIVE Sensitive     GENTAMICIN <=1 SENSITIVE Sensitive     IMIPENEM <=0.25 SENSITIVE Sensitive     NITROFURANTOIN <=16 SENSITIVE Sensitive     TRIMETH/SULFA >=320 RESISTANT Resistant     AMPICILLIN/SULBACTAM >=32 RESISTANT Resistant      PIP/TAZO 64 INTERMEDIATE Intermediate     Extended ESBL NEGATIVE Sensitive     * >=100,000 COLONIES/mL ESCHERICHIA COLI   Klebsiella pneumoniae - MIC*    AMPICILLIN >=32 RESISTANT Resistant     CEFAZOLIN <=4 SENSITIVE Sensitive     CEFTRIAXONE <=1 SENSITIVE Sensitive     CIPROFLOXACIN <=0.25 SENSITIVE Sensitive     GENTAMICIN <=1 SENSITIVE Sensitive     IMIPENEM <=0.25 SENSITIVE Sensitive     NITROFURANTOIN 64 INTERMEDIATE Intermediate     TRIMETH/SULFA <=20 SENSITIVE Sensitive     AMPICILLIN/SULBACTAM 8 SENSITIVE Sensitive     PIP/TAZO <=4 SENSITIVE Sensitive     Extended ESBL NEGATIVE Sensitive     * >=100,000 COLONIES/mL KLEBSIELLA PNEUMONIAE         Radiology Studies: No results found.      Scheduled Meds: . amLODipine  10 mg Oral QPM  . ciprofloxacin  500 mg Oral BID  . enoxaparin (LOVENOX) injection  40 mg Subcutaneous Q24H  . fenofibrate  160 mg Oral Daily  . fluticasone  1-2 spray Each Nare Daily  . gabapentin  600 mg Oral TID  . insulin aspart  0-9 Units Subcutaneous TID WC  . isosorbide mononitrate  30 mg Oral Daily  . magnesium oxide  400 mg Oral BID  . omega-3 acid ethyl esters  1 g Oral BID  . polyethylene glycol  17 g Oral BID  . senna  1 tablet Oral BID   Continuous Infusions:    LOS: 2 days    Time spent: 25 min    Bonnell Public, MD [facility] Pager [phone]  If 7PM-7AM, please contact night-coverage [email or web address] Password [facility] 01/31/2019, 6:49 PM

## 2019-01-31 NOTE — Progress Notes (Signed)
Patient coughed up a VERY minute amount of blood and states she will not take her night time Cipro dose. She states that the last time she had an allergic reaction, she coughed up a lot of blood and does not want it to get to that point. I reassured the patient that it is more than likely that her throat is aggravated from coughing all day (per daytime nurse), and the speck of blood may be from that. Pt insisted that she is allergic and will not take.   1950 Dr Silas [place] was paged and made aware. 2015 Verbal orders to hold dose and wait until morning team comes around to establish new plan for pt.

## 2019-02-01 DIAGNOSIS — G894 Chronic pain syndrome: Secondary | ICD-10-CM

## 2019-02-01 LAB — GLUCOSE, CAPILLARY
Glucose-Capillary: 96 mg/dL (ref 70–99)
Glucose-Capillary: 63 mg/dL — ABNORMAL LOW (ref 70–99)
Glucose-Capillary: 68 mg/dL — ABNORMAL LOW (ref 70–99)

## 2019-02-01 MED ORDER — GENTAMICIN SULFATE 40 MG/ML IJ SOLN
5.0000 mg/kg | Freq: Once | INTRAVENOUS | Status: AC
  Administered 2019-02-01: 400 mg via INTRAVENOUS
  Filled 2019-02-01: qty 10

## 2019-02-01 MED ORDER — ISOSORBIDE MONONITRATE ER 60 MG PO TB24: 60.0000 mg | ORAL_TABLET | Freq: Every day | ORAL | 0 refills | Status: DC

## 2019-02-01 MED ORDER — POLYETHYLENE GLYCOL 3350 17 G PO PACK: 17.0000 g | PACK | Freq: Two times a day (BID) | ORAL | 0 refills | Status: DC

## 2019-02-01 MED ORDER — SENNA 8.6 MG PO TABS: 1.0000 | ORAL_TABLET | Freq: Two times a day (BID) | ORAL | 0 refills | Status: DC

## 2019-02-01 NOTE — Plan of Care (Signed)
Patient sitting up on edge of bed this morning. States she has been up to the bathroom several times this morning. Pain better than yesterday. Does not want Cipro because she feels she is having a reaction to it. Will continue to monitor.

## 2019-02-01 NOTE — Plan of Care (Signed)
Reviewed discharge instructions with patient. IV removed. Patient ready for discharge.

## 2019-02-01 NOTE — Discharge Summary (Signed)
Physician Discharge Summary  Patient ID: Sara Tran MRN: [id number] DOB/AGE: 1956/08/17 63 y.o.  Admit date: 01/29/2019 Discharge date: 02/01/2019  Admission Diagnoses:  Discharge Diagnoses:  Active Problems:   Complicated UTI secondary to E. coli and Klebsiella pneumonia.   Pulmonary hypertension (HCC)   Chronic diastolic heart failure (HCC)   Morbid obesity (HCC)   Diabetes mellitus (HCC)   Chronic pain syndrome     Discharged Condition: stable  Hospital Course: Sara Waldron Tuckeris a 63 year old female, morbidly obese, with past medical history significant for chronic diastolic heart failure, COPD, coronary artery disease status post stent placement,recurrent UTIwithprior history of ureteral stent2018,who was referred by her primary care doctor for treatment of multidrug-resistant UTI andpatient withmultiple allergies to antibiotics.Patient presented with subjective fevers, chills, mild suprapubic pain, cloudy and foul smelling urine. Apparently, patient had been treated for urinary tract infectiontwice by her PCP in thelast 65-months preceding admission. Apparently, urine culture done on January 16, 2019 grew E. coli and Klebsiella.  Patient was admitted for further assessment and management.  Due to multiple drug allergies, patient was initially treated with IV gentamicin.  Patient was initially transitioned to oral ciprofloxacin during the hospital stay, the patient continues to report multi-somatic symptoms which patient attributed to ciprofloxacin.  Patient will be treated with another dose of IV gentamicin prior to discharge.  Patient will follow with a primary care provider and urologist on discharge.  The PCP and the urology team will kindly repeat UA and urine culture to ensure resolution of the UTI.  DM may be a supratentorial component to patient's medication allergy reports.  E. coli/Klebsiella pneumonia UTI -CT abdomen/pelvis notable for small nonobstructive kidney  stone that is 4 mm.   -Patient with multiple allergies to antibiotics and prior urine culture growing multidrug-resistant bacteria.   -Urine culture grew E. coli and Klebsiella pneumonia.   -Patient was initially managed with IV gentamicin, and then transitioned to oral ciprofloxacin during the hospital stay.  Patient reported no intolerance to ciprofloxacin. -Patient will be administered 1 more dose of IV gentamicin prior to discharge. -Patient will follow with the primary care provider and urologist for repeat UA and urine culture to ensure resolution of the complicated UTI.  Type 2 diabetes mellitus Hemoglobin A1c 6.3.   Well controlled. Resume home regimen. Metformin will be discontinued due to renal impairment. PCP will continue to optimize.  Chronic compensated diastolic congestive heart failure: Stable. Continue current management.  Essential hypertension: We will increase Imdur from 30 mg p.o. once daily to 60 mg p.o. once daily. PCP to monitor, and continue to optimize.  Coronary artery disease: -Stable.  No symptoms. --Continue Imdur and Norvasc --Nitro PRN  History of COPD --Nebs as needed  Chronic pain syndrome: Continue gabapentin and Percocet  CKD stage III: -Stable. -Continue to monitor closely.  Consults: None  Significant Diagnostic Studies: microbiology: urine culture: positive for Klebsiella pneumonia and E. coli   Discharge Exam: Blood pressure (!) 156/72, pulse 73, temperature 98.1 F (36.7 C), temperature source Oral, resp. rate 20, height 4\' 11"  (1.499 m), weight 122.2 kg, last menstrual period 06/06/2013, SpO2 97 %.   Disposition: Discharge disposition: 01-Home or Self Care   Discharge Instructions    Diet - low sodium heart healthy   Complete by:  As directed    Increase activity slowly   Complete by:  As directed      Allergies as of 02/01/2019      Reactions   Bee Venom Anaphylaxis  As a child   Ancef [cefazolin] Other (See  Comments)   Patient was told she was allergic but doesn't remember what happens   Aspirin Other (See Comments)   Rectal bleeding   Ceftin [cefuroxime Axetil] Swelling   Swollen tongue, "made me crazy"   Ciprofloxacin Cough   Coughed up blood   Macrodantin [nitrofurantoin] Other (See Comments)   Vomited blood and had dark stool.   Milk-related Compounds Other (See Comments)   IBS   Other    Snake venom - not sure of reaction -had problems with feet    Latex Itching, Rash      Medication List    STOP taking these medications   losartan 50 MG tablet Commonly known as:  COZAAR   metFORMIN 500 MG 24 hr tablet Commonly known as:  GLUCOPHAGE-XR   potassium chloride SA 20 MEQ tablet Commonly known as:  K-DUR,KLOR-CON     TAKE these medications   albuterol 108 (90 Base) MCG/ACT inhaler Commonly known as:  PROVENTIL HFA;VENTOLIN HFA Inhale 2 puffs into the lungs every 6 (six) hours as needed. FOR SHORTNESS OF BREATH/WHEEZING   albuterol (2.5 MG/3ML) 0.083% nebulizer solution Commonly known as:  PROVENTIL Take 2.5 mg by nebulization every 4 (four) hours as needed for wheezing or shortness of breath.   amLODipine 10 MG tablet Commonly known as:  NORVASC Take 1 tablet by mouth every evening.   baclofen 10 MG tablet Commonly known as:  LIORESAL Take 1 tablet (10 mg total) by mouth 3 (three) times daily as needed for muscle spasms.   fenofibrate 160 MG tablet Take 160 mg by mouth daily.   fluticasone 50 MCG/ACT nasal spray Commonly known as:  FLONASE Place 1-2 sprays into the nose daily.   gabapentin 300 MG capsule Commonly known as:  NEURONTIN Take 600 mg by mouth 3 (three) times daily.   isosorbide mononitrate 30 MG 24 hr tablet Commonly known as:  IMDUR Take 30 mg by mouth every morning. What changed:  Another medication with the same name was added. Make sure you understand how and when to take each.   isosorbide mononitrate 60 MG 24 hr tablet Commonly known as:   IMDUR Take 1 tablet (60 mg total) by mouth daily. What changed:  You were already taking a medication with the same name, and this prescription was added. Make sure you understand how and when to take each.   loratadine 10 MG tablet Commonly known as:  CLARITIN Take 1 tablet by mouth daily as needed for allergies.   magnesium oxide 400 MG tablet Commonly known as:  MAG-OX Take 400 mg by mouth 2 (two) times daily.   nitroGLYCERIN 0.4 MG SL tablet Commonly known as:  NITROSTAT Place 0.4 mg under the tongue every 5 (five) minutes as needed. Reported on 06/14/2016   omega-3 acid ethyl esters 1 g capsule Commonly known as:  LOVAZA Take 1 g by mouth 2 (two) times daily.   ONE-A-DAY 50 PLUS PO Take 1 tablet by mouth daily.   oxyCODONE-acetaminophen 10-325 MG tablet Commonly known as:  PERCOCET Take 1 tablet by mouth 4 (four) times daily as needed for pain.   polyethylene glycol packet Commonly known as:  MIRALAX / GLYCOLAX Take 17 g by mouth 2 (two) times daily.   senna 8.6 MG Tabs tablet Commonly known as:  SENOKOT Take 1 tablet (8.6 mg total) by mouth 2 (two) times daily.   TRULICITY 1.5 WG/6.6ZL Sopn Generic drug:  Dulaglutide Inject 1.5 mg  into the skin every 7 (seven) days. What changed:  Another medication with the same name was removed. Continue taking this medication, and follow the directions you see here.        SignedBonnell Public 02/01/2019, 10:32 AM

## 2019-02-06 ENCOUNTER — Ambulatory Visit: Payer: Medicare HMO | Admitting: Urology

## 2019-02-09 DIAGNOSIS — J189 Pneumonia, unspecified organism: Secondary | ICD-10-CM | POA: Diagnosis not present

## 2019-02-09 DIAGNOSIS — Z6841 Body Mass Index (BMI) 40.0 and over, adult: Secondary | ICD-10-CM | POA: Diagnosis not present

## 2019-02-09 DIAGNOSIS — N390 Urinary tract infection, site not specified: Secondary | ICD-10-CM | POA: Diagnosis not present

## 2019-03-04 DIAGNOSIS — J069 Acute upper respiratory infection, unspecified: Secondary | ICD-10-CM | POA: Diagnosis not present

## 2019-03-04 DIAGNOSIS — Z6841 Body Mass Index (BMI) 40.0 and over, adult: Secondary | ICD-10-CM | POA: Diagnosis not present

## 2019-03-04 DIAGNOSIS — N390 Urinary tract infection, site not specified: Secondary | ICD-10-CM | POA: Diagnosis not present

## 2019-03-16 DIAGNOSIS — G4733 Obstructive sleep apnea (adult) (pediatric): Secondary | ICD-10-CM | POA: Diagnosis not present

## 2019-03-19 DIAGNOSIS — J449 Chronic obstructive pulmonary disease, unspecified: Secondary | ICD-10-CM | POA: Diagnosis not present

## 2019-03-19 DIAGNOSIS — E669 Obesity, unspecified: Secondary | ICD-10-CM | POA: Diagnosis not present

## 2019-03-19 DIAGNOSIS — G4733 Obstructive sleep apnea (adult) (pediatric): Secondary | ICD-10-CM | POA: Diagnosis not present

## 2019-03-19 DIAGNOSIS — I10 Essential (primary) hypertension: Secondary | ICD-10-CM | POA: Diagnosis not present

## 2019-03-23 ENCOUNTER — Ambulatory Visit: Payer: Medicare HMO

## 2019-04-15 DIAGNOSIS — G894 Chronic pain syndrome: Secondary | ICD-10-CM | POA: Diagnosis not present

## 2019-04-15 DIAGNOSIS — L989 Disorder of the skin and subcutaneous tissue, unspecified: Secondary | ICD-10-CM | POA: Diagnosis not present

## 2019-04-15 DIAGNOSIS — J069 Acute upper respiratory infection, unspecified: Secondary | ICD-10-CM | POA: Diagnosis not present

## 2019-04-15 DIAGNOSIS — Z6841 Body Mass Index (BMI) 40.0 and over, adult: Secondary | ICD-10-CM | POA: Diagnosis not present

## 2019-05-27 ENCOUNTER — Other Ambulatory Visit: Payer: Self-pay | Admitting: Physical Medicine & Rehabilitation

## 2019-05-27 DIAGNOSIS — M47816 Spondylosis without myelopathy or radiculopathy, lumbar region: Secondary | ICD-10-CM

## 2019-05-27 DIAGNOSIS — E119 Type 2 diabetes mellitus without complications: Secondary | ICD-10-CM | POA: Diagnosis not present

## 2019-05-27 DIAGNOSIS — G894 Chronic pain syndrome: Secondary | ICD-10-CM | POA: Diagnosis not present

## 2019-05-27 DIAGNOSIS — Z0001 Encounter for general adult medical examination with abnormal findings: Secondary | ICD-10-CM | POA: Diagnosis not present

## 2019-05-27 DIAGNOSIS — M1991 Primary osteoarthritis, unspecified site: Secondary | ICD-10-CM | POA: Diagnosis not present

## 2019-05-27 DIAGNOSIS — Z6841 Body Mass Index (BMI) 40.0 and over, adult: Secondary | ICD-10-CM | POA: Diagnosis not present

## 2019-05-27 DIAGNOSIS — I10 Essential (primary) hypertension: Secondary | ICD-10-CM | POA: Diagnosis not present

## 2019-05-27 DIAGNOSIS — I5032 Chronic diastolic (congestive) heart failure: Secondary | ICD-10-CM | POA: Diagnosis not present

## 2019-05-27 DIAGNOSIS — E7849 Other hyperlipidemia: Secondary | ICD-10-CM | POA: Diagnosis not present

## 2019-05-28 DIAGNOSIS — E7849 Other hyperlipidemia: Secondary | ICD-10-CM | POA: Diagnosis not present

## 2019-05-28 DIAGNOSIS — E119 Type 2 diabetes mellitus without complications: Secondary | ICD-10-CM | POA: Diagnosis not present

## 2019-05-28 DIAGNOSIS — E559 Vitamin D deficiency, unspecified: Secondary | ICD-10-CM | POA: Diagnosis not present

## 2019-05-28 DIAGNOSIS — Z6841 Body Mass Index (BMI) 40.0 and over, adult: Secondary | ICD-10-CM | POA: Diagnosis not present

## 2019-05-28 DIAGNOSIS — Z1389 Encounter for screening for other disorder: Secondary | ICD-10-CM | POA: Diagnosis not present

## 2019-05-28 DIAGNOSIS — E782 Mixed hyperlipidemia: Secondary | ICD-10-CM | POA: Diagnosis not present

## 2019-05-28 DIAGNOSIS — Z0001 Encounter for general adult medical examination with abnormal findings: Secondary | ICD-10-CM | POA: Diagnosis not present

## 2019-06-24 DIAGNOSIS — R609 Edema, unspecified: Secondary | ICD-10-CM | POA: Diagnosis not present

## 2019-06-24 DIAGNOSIS — J45901 Unspecified asthma with (acute) exacerbation: Secondary | ICD-10-CM | POA: Diagnosis not present

## 2019-06-24 DIAGNOSIS — G4733 Obstructive sleep apnea (adult) (pediatric): Secondary | ICD-10-CM | POA: Diagnosis not present

## 2019-06-24 DIAGNOSIS — I10 Essential (primary) hypertension: Secondary | ICD-10-CM | POA: Diagnosis not present

## 2019-06-26 DIAGNOSIS — G894 Chronic pain syndrome: Secondary | ICD-10-CM | POA: Diagnosis not present

## 2019-07-30 DIAGNOSIS — E669 Obesity, unspecified: Secondary | ICD-10-CM | POA: Diagnosis not present

## 2019-07-30 DIAGNOSIS — I10 Essential (primary) hypertension: Secondary | ICD-10-CM | POA: Diagnosis not present

## 2019-07-30 DIAGNOSIS — E119 Type 2 diabetes mellitus without complications: Secondary | ICD-10-CM | POA: Diagnosis not present

## 2019-08-13 ENCOUNTER — Telehealth: Payer: Self-pay

## 2019-08-13 ENCOUNTER — Other Ambulatory Visit: Payer: Self-pay

## 2019-08-13 NOTE — Patient Outreach (Signed)
[place] [facility]) Care Management  08/13/2019  Sara Tran 1956-12-01 [id number]   Telephone Screen  Referral Date: 08/13/2019 Referral Source: Join EMMI Call Referral Reason: "patient engagement score 13, DM, HTN, heart disease" Insurance: [employer]   Incoming call from patient returning RN CM call. Spoke with patient and screening completed.She voices that her "MD told her to sign up for [facility]" so she could "fget some help with meds." Patient immediately begins to share how costly her meds(trulicty and Symbicort) are. She voices she is paying about $125/month and $80/month respectively. She reports taking about nine meds total. She is able to manage her meds but is looking for med assistance to help with cost. Patient resides in her home alone. She is independent with ADLs/IADLs. She denies any recent falls. She voices that she drives herself to MD appts normally but currently her car is out of service. She is relying on family/friends to help her get around. Per chart review, patient has PMH of DM, pulmonary HTN, obesity, chronic pain and CHF. Patient voices that she is monitoring her blood sugars about 2x/day. She voices that cbgs range in the mid 100s. Last A1C was 6.0(May 2002). Patient declines needing further education/support in managing chronic conditions. She denies having any RN CM needs or concerns at this time. She voices that she needs several dental procedures done but is unable to afford them. She states that she has bought two supplemental dental plans but still unable to afford going to the dentist. She is asking for free to reduced dental resources. Patient agreeable to [facility] SW referral for possible assistance.     Plan: RN CM will send [facility] referral for possible dental resources. RN CM will send [facility] pharmacy referral for possible med assistance.    Enzo Montgomery, RN,BSN,CCM [place] Management Telephonic Care Management Coordinator Direct Phone:  [phone] Toll Free: [phone] Fax: [phone]

## 2019-08-13 NOTE — Patient Outreach (Signed)
[place] [facility]) Care Management  08/13/2019  Sara Tran Aug 26, 1956 [id number]   Telephone Screen  Referral Date: 08/13/2019 Referral Source: Join EMMI Call Referral Reason: "patient engagement score 13, DM, HTN, heart disease" Insurance: [employer]   Outreach attempt # 1 to patient. No answer. RN CM left HIPAA compliant voicemail message along with contact info.t.      Plan: RN CM will make outreach attempt to patient within 3-4 business days. RN CM will send unsuccessful outreach letter to patient.   Enzo Montgomery, RN,BSN,CCM [place] Management Telephonic Care Management Coordinator Direct Phone: [phone] Toll Free: [phone] Fax: [phone]

## 2019-08-14 ENCOUNTER — Telehealth: Payer: Self-pay | Admitting: Pharmacist

## 2019-08-14 NOTE — Patient Outreach (Signed)
[facility]) Care Management  08/14/2019  Sara Tran [birth year] [id number]   Patient was called regarding medication assistance. Unfortunately, she did not answer her phone. HIPAA compliant message was left on her voicemail.  Plan: Send unsuccessful outreach letter Call patient back in 5-7 business days.   Elayne Guerin, PharmD, [place] Clinical Pharmacist [phone]

## 2019-08-14 NOTE — Telephone Encounter (Signed)
-----   Message from Jiles Harold sent at 08/13/2019  3:04 PM EDT ----- Regarding: Referral: Order for [place]  Referral from Enzo Montgomery, RN  "Please see below request"  Forde Radon ----- Message ----- From: Hayden Pedro, RN Sent: 08/13/2019   2:48 PM EDT To: Thn Cm Communication Orders Subject: Order for CATHLINE, DOWEN                        Patient Name: Sara Tran, Sara Tran U([id number]) Sex: Female DOB: 11/05/1956    PCP: [place]   Types of orders made on 08/13/2019: Nursing  Order Date:08/13/2019 Ordering User:FLORANCE, Shelda Jakes [[id number]] Encounter Provider:Florance, [name], RN [[id number]] Authorizing Provider: Sharilyn Sites, MD [[id number]] Department:[facility][[id number]]  Order Specific Information Order: Comm to Pharmacy [Custom: [id number]]  Order #: [id number] Qty: 1   Priority: Routine  Class: Clinic Performed     Reason for Consult -> Medication Assistance       Priority: Routine  Class: Clinic Performed    Scheduling Instructions:   [place] Referral   Referral from: Join EMMI Call   Medication assistance. Patient unable to afford meds-Trulicity and Symbicort     Reason for Consult -> Medication Assistance

## 2019-08-24 ENCOUNTER — Other Ambulatory Visit: Payer: Self-pay | Admitting: Pharmacist

## 2019-08-24 DIAGNOSIS — Z6841 Body Mass Index (BMI) 40.0 and over, adult: Secondary | ICD-10-CM | POA: Diagnosis not present

## 2019-08-24 DIAGNOSIS — G894 Chronic pain syndrome: Secondary | ICD-10-CM | POA: Diagnosis not present

## 2019-08-24 NOTE — Patient Outreach (Signed)
[place] [facility]) Care Management  08/24/2019  Sara Tran 23-Feb-1956 [id number]   Patient was called regarding medication assistance HIPAA identifiers were obtained. Explained the purpose of my call and reviewed the patient's allergies. She placed me on hold and the call disconnected. I called the patient back but she did not answer.  HIPAA compliant message was left on her voicemail.  Today's call was the 2nd unsuccessful phone call.  Plan: Await a call from the patient. Call patient back in 7-10 business days. If I cannot get her after 7-10 business days, consider closing her case.  Patient was sent an unsuccessful outreach letter on 08/14/2019.   Elayne Guerin, PharmD, [place] Clinical Pharmacist [phone]

## 2019-08-31 DIAGNOSIS — N183 Chronic kidney disease, stage 3 (moderate): Secondary | ICD-10-CM | POA: Diagnosis not present

## 2019-08-31 DIAGNOSIS — I10 Essential (primary) hypertension: Secondary | ICD-10-CM | POA: Diagnosis not present

## 2019-08-31 DIAGNOSIS — E119 Type 2 diabetes mellitus without complications: Secondary | ICD-10-CM | POA: Diagnosis not present

## 2019-09-14 ENCOUNTER — Other Ambulatory Visit: Payer: Self-pay | Admitting: Pharmacist

## 2019-09-15 ENCOUNTER — Other Ambulatory Visit: Payer: Self-pay | Admitting: Pharmacy Technician

## 2019-09-15 NOTE — Patient Outreach (Signed)
[name]) Care Management  09/15/2019  Sara Tran 02-Jan-1956 [id number]                                       Medication Assistance Referral  Referral From: [place]  Medication/Company: Symbicort / AZ&ME Patient application portion:  Mailed Provider application portion: Faxed  to Dr. Sharilyn Sites Provider address/fax verified via: Office website & phone call  Medication/Company: Danelle Berry / Lilly Patient application portion:  Mailed Provider application portion: Faxed  to Dr, Sharilyn Sites Provider address/fax verified via: Office website & phone call     Follow up:  Will follow up with patient in 5-10 business days to confirm application(s) have been received.  Killian Ress P. Iverson Sees, [place] Management [phone]

## 2019-09-15 NOTE — Patient Outreach (Addendum)
[name]) Care Management  [place]   09/15/2019  Sara Tran [birth year] [id number]  Reason for referral: medication assistance Referral source: [facility] RN Referral medication(s): Symbicort and Trulicity Current insurance: [employer]  HPI:  Patient is a 63 year old female with multiple medical conditions including but not limited to:  Type 2 diabetes, chronic back pain, cervicalgia, IBS, osteoarthritis, pulmonary hypertension, unstable angina    Objective: Allergies  Allergen Reactions  . Ancef [Cefazolin] Anaphylaxis and Shortness Of Breath       . Bee Venom Anaphylaxis    As a child  . Aspirin Other (See Comments)    Rectal bleeding  . Ceftin [Cefuroxime Axetil] Swelling    Swollen tongue  . Ciprofloxacin Cough    Coughed up blood  . Macrodantin [Nitrofurantoin] Other (See Comments)    Vomited blood and had dark stool.  . Milk-Related Compounds Other (See Comments)    IBS  . Other     Snake venom - not sure of reaction -had problems with feet   . Latex Itching and Rash    Medications Reviewed Today    Reviewed by Elayne Guerin, [facility] (Pharmacist) on 09/14/19 at 1121  Med List Status: <None>  Medication Order Taking? Sig Documenting Provider Last Dose Status Informant  albuterol (PROVENTIL HFA;VENTOLIN HFA) 108 (90 BASE) MCG/ACT inhaler [id number] Yes Inhale 2 puffs into the lungs every 6 (six) hours as needed. FOR SHORTNESS OF BREATH/WHEEZING [provider] Taking Active Self  albuterol (PROVENTIL) (2.5 MG/3ML) 0.083% nebulizer solution [id number] Yes Take 2.5 mg by nebulization every 4 (four) hours as needed for wheezing or shortness of breath.  [provider] Taking Active Self           Med Note [name], JESSICA R   Tue Jul 23, 2017  2:17 PM)    amLODipine (NORVASC) 10 MG tablet [id number] Yes Take 1 tablet by mouth every evening.  [provider] Taking Active Self  baclofen (LIORESAL) 10 MG tablet [id number] Yes Take 1  tablet (10 mg total) by mouth 3 (three) times daily as needed for muscle spasms. Meredith Staggers, MD Taking Active Self  clobetasol (TEMOVATE) 0.05 % external solution [id number] Yes Apply to scalp daily [provider] Taking Active   EPINEPHrine 0.3 mg/0.3 mL IJ SOAJ injection [id number] Yes Use as needed [provider] Taking Active   fluticasone (FLONASE) 50 MCG/ACT nasal spray [id number] Yes Place 1-2 sprays into the nose daily.  [provider] Taking Active Self  furosemide (LASIX) 40 MG tablet [id number] Yes Take 1 tablet by mouth daily as needed. [provider] Taking Active   gabapentin (NEURONTIN) 600 MG tablet [id number] Yes Take 1 tablet by mouth 3 (three) times daily. [provider] Taking Active   isosorbide mononitrate (IMDUR) 30 MG 24 hr tablet [id number] Yes Take 30 mg by mouth every morning.  [provider] Taking Active Self  loratadine (CLARITIN) 10 MG tablet [id number] Yes Take 1 tablet by mouth daily as needed for allergies.  [provider] Taking Active Self  magnesium oxide (MAG-OX) 400 MG tablet [id number] Yes Take 400 mg by mouth 2 (two) times daily.  [provider] Taking Active Self  Multiple Vitamins-Minerals (ONE-A-DAY 50 PLUS PO) [id number] Yes Take 1 tablet by mouth daily. [provider] Taking Active Self  nitroGLYCERIN (NITROSTAT) 0.4 MG SL tablet [id number] Yes Place 0.4 mg under the tongue every 5 (five) minutes as needed. Reported on 06/14/2016 [provider] Taking Active Self  omega-3 acid ethyl esters (LOVAZA) 1 g capsule [id number] Yes Take 1 g by mouth 2 (two) times daily.  [provider] Taking Active Self  oxyCODONE-acetaminophen (PERCOCET) 10-325 MG tablet [id number] No Take 1 tablet by mouth 4 (four) times daily as needed for pain.  [provider] Not Taking Active Self  polyethylene glycol (MIRALAX / GLYCOLAX) packet [id number] Yes Take 17 g by mouth 2  (two) times daily. Bon[place] Public, MD Taking Active   potassium chloride SA (K-DUR) 20 MEQ tablet [id number] Yes Take 1 tablet by mouth daily. [provider] Taking Active   pravastatin (PRAVACHOL) 20 MG tablet [id number] Yes Take 1 tablet by mouth daily. [provider] Taking Active   SYMBICORT 160-4.5 MCG/ACT inhaler [id number] Yes Take 2 puffs by mouth 2 (two) times daily. [provider] Taking Active   TRULICITY 1.5 TW/6.5KC SOPN [id number] Yes Inject 1.5 mg into the skin every 7 (seven) days. [provider] Taking Active Self           Med Note Eulas Post, CRYSTAL D   Thu Jan 29, 2019  5:16 AM) On fridays           Assessment:  Drugs sorted by system:  Neurologic/Psychologic: Gabapentin,   Cardiovascular: Amlodipine, Pravastatin, Furosemide, Isosorbide Mononitrate, Nitroglycerin  Pulmonary/Allergy: Albuterol HFA, Albuterol Nebulizer Solution, Symbicort, Epinephrine, Fluticasone Nasal Spray, Loratadine,   Gastrointestinal: Metamucil  Endocrine: Trulicity,   Topical: Clobetasol Solution  Pain: Baclofen, Oxycodone/Acetaminophen  Genitourinary: Psyllium  Vitamins/Minerals/Supplements: Potassium Chloride, Magnesium Oxide, Multiple Vitamin, Omega 3 Fatty Acides    Medication Review Findings:  . HgA1c-6% 05/27/19, 6.3%  01/28/2019, ^ o On statin- Pravastatin filled 3/27 & 6/12 for 90 day supply LDL 70  Medication Assistance Findings:  Medication assistance needs identified: Trulicity and Symbicort Patient also expressed concern with EpiPen. Unfortunately, the programs that are available do not allow patients who have Medicare.  Patient will be sent applications for both Trulicity and Symbicort   She communicated understanding about providing proof of income and documentation of her pharmacy spending from the [employer] EOB.  Additional medication assistance options reviewed with patient as warranted:  No other options  identified  Plan: I will route patient assistance letter to [place] technician who will coordinate patient assistance program application process for medications listed above.  [facility] pharmacy technician will assist with obtaining all required documents from both patient and provider(s) and submit application(s) once completed.     Elayne Guerin, PharmD, [place] Clinical Pharmacist [phone]

## 2019-09-16 DIAGNOSIS — G4733 Obstructive sleep apnea (adult) (pediatric): Secondary | ICD-10-CM | POA: Diagnosis not present

## 2019-09-23 ENCOUNTER — Other Ambulatory Visit: Payer: Self-pay | Admitting: Pharmacy Technician

## 2019-09-23 NOTE — Patient Outreach (Signed)
[facility]) Care Management  09/23/2019  Sara Tran 04-02-1956 [id number]  Successful outreach call placed to patient in regards to [place] application for Trulicity and AZ&ME application for Symbicort.  Spoke to patient, HIPAA identifiers verified.  Patient informed she "thinks" she has received them and will get them fill out and sent back as soon as possible. She informed she has no questions about the applications.  Will followup with patient in 10-15 business days if applications have not been received back.  [name] P. Alvin Rubano, [place] Management [phone]

## 2019-09-24 DIAGNOSIS — G4733 Obstructive sleep apnea (adult) (pediatric): Secondary | ICD-10-CM | POA: Diagnosis not present

## 2019-09-24 DIAGNOSIS — J301 Allergic rhinitis due to pollen: Secondary | ICD-10-CM | POA: Diagnosis not present

## 2019-09-24 DIAGNOSIS — E669 Obesity, unspecified: Secondary | ICD-10-CM | POA: Diagnosis not present

## 2019-09-24 DIAGNOSIS — J441 Chronic obstructive pulmonary disease with (acute) exacerbation: Secondary | ICD-10-CM | POA: Diagnosis not present

## 2019-10-09 DIAGNOSIS — G894 Chronic pain syndrome: Secondary | ICD-10-CM | POA: Diagnosis not present

## 2019-10-09 DIAGNOSIS — Z6841 Body Mass Index (BMI) 40.0 and over, adult: Secondary | ICD-10-CM | POA: Diagnosis not present

## 2019-10-13 ENCOUNTER — Other Ambulatory Visit: Payer: Self-pay | Admitting: Pharmacy Technician

## 2019-10-13 NOTE — Patient Outreach (Signed)
[facility] [facility]) Care Management  10/13/2019  KAYDINCE TOWLES [birth year] [id number]    Unsuccessful outreach call placed to patient in regards to AZ&ME application for Symbicort and [employer] application for Trulicity.  Unfortunately patient did not answer the phone, HIPAA compliant voicemail left.  Was calling patient to inquire if she had mailed the supporting documentation that is required of the PAP in a separate envelope as we received the applications but no supporting documents as requested.  Will followup with another phone call in 5-10 business days if call is not returned or if information is not received.  Carilyn Woolston P. Maston Wight, [place] Management [phone]

## 2019-10-14 ENCOUNTER — Other Ambulatory Visit: Payer: Self-pay | Admitting: Pharmacy Technician

## 2019-10-14 NOTE — Patient Outreach (Signed)
[facility]) Care Management  10/14/2019  Sara Tran 02-23-1956 [id number]   Unsuccessful outreach call placed to patient in regards to AZ&ME application for Symbicort and [employer] application for Trulicity.  Was returning patient's voicemail that she left for me. Unfortunately she did not answer the phone. HIPAA compliant voicemail left.  Was calling patient to inquire if she had mailed the supporting documentation that is required of the PAP in a separate envelope as the applications were the only thing received back.  Will followup in 5-10 business days if call is not returned or if information is not received.  Pria Klosinski P. Sarajane Fambrough, [place] Management [phone]

## 2019-10-14 NOTE — Patient Outreach (Signed)
[name] [facility]) Care Management  10/14/2019  Sara Tran 06/05/1956 [id number]  ADDENDUM  Incoming call from patient in regards to AZ&ME application for Symbicort and [employer] application for Trulicity.  Spoke to patient, HIPAA identifiers verified.  Patient informed she had sent in the supporting documentation. Informed patient once I received it, then I will submit the applications to the respective companies.  Muaaz Brau P. Audia Amick, [place] Management [phone] s

## 2019-10-15 ENCOUNTER — Other Ambulatory Visit: Payer: Self-pay | Admitting: Pharmacy Technician

## 2019-10-15 ENCOUNTER — Ambulatory Visit: Payer: Self-pay | Admitting: Pharmacist

## 2019-10-15 NOTE — Patient Outreach (Signed)
[facility] [facility]) Care Management  10/15/2019  Sara Tran December 07, 1956 [id number]    Received all necessary information from both patient and provider for AZ&ME patient assistance for Symbicort.  Submitted completed application via fax.  Will followup with AZ&ME in 5-7 business days to inquire on status of application.  [name] P. Alyssamarie Mounsey, [place] Management [phone]

## 2019-10-16 ENCOUNTER — Other Ambulatory Visit: Payer: Self-pay | Admitting: Pharmacy Technician

## 2019-10-16 NOTE — Patient Outreach (Signed)
[facility]) Care Management  10/16/2019  Sara Tran 1956-07-09 [id number]   Incoming call received from patient in regards to AZ&ME application for Symbicort and Lilly application for [employer].  Spoke to patient, HIPAA identifiers verified.  Patient called to inform that she did not send in the financials on Wednesday as she said she would but that she is sending them today.  Once I receive them, then I will submit them to the PAP.  Sara Tran, [place] Management [phone]

## 2019-10-20 ENCOUNTER — Other Ambulatory Visit: Payer: Self-pay | Admitting: Pharmacy Technician

## 2019-10-20 NOTE — Patient Outreach (Signed)
[facility]) Care Management  10/20/2019  BALBINA DEPACE 1956-07-24 [id number]    ADDENDUM  Care coordination call placed to AZ&ME in regards to patient's application for Symbicort.  Spoke to [place] who informed patient had been APPROVED 10/20/2019-12/31/2019. Lisabeth Devoid informed patient should receive the medication in the next 7-10 business days. She informed she would received 3 inhalers for 90 days supply.  Will followup with patient in 10-14 business days to inquire if medication was received.  Fredi Geiler P. Arali Somera, [place] Management [phone]

## 2019-10-20 NOTE — Patient Outreach (Signed)
[facility]) Care Management  10/20/2019  Sara Tran [birth year] [id number]    Received all necessary documents and signatures from both patient and provider for [facility] application for Trulicity.  Submitted completed application to [employer].  Will followup with Lilly in 5-10 business days to inquire on status of application.  Sara Tran P. Tajay Muzzy, [place] Management [phone]

## 2019-10-22 ENCOUNTER — Other Ambulatory Visit: Payer: Self-pay | Admitting: Pharmacy Technician

## 2019-10-22 NOTE — Patient Outreach (Signed)
[facility] [facility]) Care Management  10/22/2019  Sara Tran 09-27-1956 [id number]   Care coordination call placed to [place] in regards to patient's Trulicity application.  Spoke to [place] who informed they were unable to read the patient's financial information that was submitted. Tillie Rung informed to fax that information only again and to followup in a few days.  Faxed Lilly the proof of income.  Will followup in 2-3 business days to inquire if a determination has been made.  Maddex Garlitz P. Shasha Buchbinder, [place] Management [phone]

## 2019-10-27 ENCOUNTER — Other Ambulatory Visit: Payer: Self-pay | Admitting: Pharmacy Technician

## 2019-10-27 NOTE — Patient Outreach (Signed)
[facility] [facility]) Care Management  10/27/2019  Sara Tran 21-Dec-1956 [id number]  Care coordination call placed to [place] in regards to patient's Trulicity application.  Spoke to [place] who informed patient was APPROVED 10/23/2019-12/31/2019. She informed an order was placed on 10/23/2019 with their pharmacy.  Care coordination call placed to [place]. Spoke to [place] who informed they have tried to reach out to patient a few times to set up delivery but have been unable to reach patient.  Will outreach patient with this information.  Enijah Furr P. Johannes Everage, [place] Management [phone]

## 2019-10-27 NOTE — Patient Outreach (Signed)
[facility]) Care Management  10/27/2019  Sara Tran 1956-06-09 [id number]    ADDENDUM  Unsuccessful outreach call placed to patient in regards to [place] application for Trulicity.  Unfortunately patient did not answer the phone, HIPAA compliant voicemail left.  Was calling patient to provider her with the phone number to set up her medication delivery.  Will try and outreach patient again in 3-5 business days.  Sara Tran, [place] Management [phone]

## 2019-10-28 ENCOUNTER — Other Ambulatory Visit: Payer: Self-pay | Admitting: Pharmacy Technician

## 2019-10-28 DIAGNOSIS — R82991 Hypocitraturia: Secondary | ICD-10-CM | POA: Diagnosis not present

## 2019-10-28 NOTE — Patient Outreach (Signed)
[place] [facility]) Care Management  10/28/2019  Sara Tran Sep 16, 1956 [id number]  Incoming call received from patient in regards to voicemail that was left for her regarding her [place] application for Symbicort and [place] application for [employer].  Spoke to patient, HIPAA identifiers verified.  Patient informed she received her inhalers form the [place] patient assistance program.  Informed patient that she would need to call into [place] to set up delivery of her Trulicity. Provided the patient the number to call which is [phone]. Patient verbalized understanding and informed she would try and call today.  Patient inquired if she would have to re enroll. Informed patient she would have to re enroll for both programs but with AZ&ME she would have to meet the OOP requirement again. Patient verbalized understanding.  Will followup with patient in 5-7 business days to confirm receipt of medication and to discuss refill procedure if patient were to need refills before program expires at the end of the year.  Teriann Livingood P. Chlora Mcbain, [place] Management [phone]

## 2019-10-30 DIAGNOSIS — R82991 Hypocitraturia: Secondary | ICD-10-CM | POA: Diagnosis not present

## 2019-10-30 DIAGNOSIS — N183 Chronic kidney disease, stage 3 unspecified: Secondary | ICD-10-CM | POA: Diagnosis not present

## 2019-10-30 DIAGNOSIS — E119 Type 2 diabetes mellitus without complications: Secondary | ICD-10-CM | POA: Diagnosis not present

## 2019-10-30 DIAGNOSIS — E7849 Other hyperlipidemia: Secondary | ICD-10-CM | POA: Diagnosis not present

## 2019-10-30 DIAGNOSIS — I10 Essential (primary) hypertension: Secondary | ICD-10-CM | POA: Diagnosis not present

## 2019-11-05 ENCOUNTER — Other Ambulatory Visit: Payer: Self-pay | Admitting: Pharmacy Technician

## 2019-11-05 NOTE — Patient Outreach (Signed)
[facility]) Care Management  11/05/2019  Sara Tran [birth year] [id number]    Incoming call received from patient in regards to [place] application for Trulicity.  Spoke to patient, HIPAA identifiers verified.  Patient informed that although she was home yesterday she was not able to get to the door in time to receive her Trulicity delivery. She was calling to inquire if I had the phone number for the driver.  Informed patient while I do not have the phone number for the delivery drive, I could provide her with the phone number for [facility] so that she could call them and have the medicaiton re scheduled for delivery. Provided patient the phone number. Patient verbalized understanding.  Will followup with patient in 5-7 business days to verify receipt of medication.  Kenisha Lynds P. Calan Doren, [place] Management [phone]

## 2019-11-11 ENCOUNTER — Other Ambulatory Visit: Payer: Self-pay | Admitting: Pharmacy Technician

## 2019-11-11 NOTE — Patient Outreach (Signed)
[place] [facility]) Care Management  11/11/2019  Sara Tran 12-09-1956 [id number]   Unsuccessful outreach call placed to patient in regards to [place] application for Trulicity.  Unfortunately patient did not answer the phone, HIPAA compliant voicemail left.  Was calling patient to inquire if she has scheduled her Trulicity shipment or if she has received it.  Will attempt another outreach in 3-5 business days.  [name] P. [place], [place] Management [phone]

## 2019-11-13 ENCOUNTER — Other Ambulatory Visit: Payer: Self-pay

## 2019-11-13 ENCOUNTER — Other Ambulatory Visit (HOSPITAL_COMMUNITY): Payer: Self-pay | Admitting: Family Medicine

## 2019-11-13 ENCOUNTER — Ambulatory Visit (HOSPITAL_COMMUNITY)
Admission: RE | Admit: 2019-11-13 | Discharge: 2019-11-13 | Disposition: A | Discharge status: Home / Self Care | Payer: Medicare HMO | Source: Ambulatory Visit | Attending: Family Medicine | Admitting: Family Medicine

## 2019-11-13 DIAGNOSIS — S2341XA Sprain of ribs, initial encounter: Secondary | ICD-10-CM

## 2019-11-13 DIAGNOSIS — M79641 Pain in right hand: Secondary | ICD-10-CM | POA: Diagnosis not present

## 2019-11-13 DIAGNOSIS — R6889 Other general symptoms and signs: Secondary | ICD-10-CM | POA: Diagnosis not present

## 2019-11-13 DIAGNOSIS — S299XXA Unspecified injury of thorax, initial encounter: Secondary | ICD-10-CM | POA: Diagnosis not present

## 2019-11-13 DIAGNOSIS — Z6841 Body Mass Index (BMI) 40.0 and over, adult: Secondary | ICD-10-CM | POA: Diagnosis not present

## 2019-11-18 ENCOUNTER — Other Ambulatory Visit: Payer: Self-pay | Admitting: Pharmacy Technician

## 2019-11-18 DIAGNOSIS — E119 Type 2 diabetes mellitus without complications: Secondary | ICD-10-CM | POA: Diagnosis not present

## 2019-11-18 NOTE — Patient Outreach (Signed)
[place] [facility]) Care Management  11/18/2019  Sara Tran 10/28/1956 [id number]   Successful outreach call placed to patient in regards to [place] application for Trulicity.  Spoke to patient, HIPAA identifiers verified.  Patient informed she received 4 boxes of Trulicity. She denies having any questions about it. Informed patient she would have to reapply for the program in 2021. Informed patient that we would try and outreach concerning re enrollment but that if she has not heard from [place] by mid to late January to call us.   Informed patient that with her AZ&ME application for Symbicort, she would have to meet the [facility] requirements before re applying. Patient verbalized understanding.  Confirmed patient had name and number.  Will route note to [place] that patient assistance has been completed. Will remove myself form care team.  Luiz Ochoa. Hazen Brumett, [place] Management [phone]

## 2019-11-23 ENCOUNTER — Other Ambulatory Visit: Payer: Self-pay

## 2019-11-23 NOTE — Patient Outreach (Signed)
[facility]) Care Management  11/23/2019  Sara Tran [birth year] [id number]   Telephone Screen  Referral Date: 11/20/2019 Referral Source: eye screening done on 11/18/2019 at [facility] Referral Reason: "ongoing disease mgmt" Insurance: [employer]   Outreach attempt #1 to patient. No answer. RN CM left HIPAA compliant voicemail message along with contact info.      Plan: RN CM will make outreach attempt to patient within 3-4 business days. RN CM will send unsuccessful outreach letter to patient.   Enzo Montgomery, RN,BSN,CCM [place] Management Telephonic Care Management Coordinator Direct Phone: [phone] Toll Free: [phone] Fax: [phone]

## 2019-11-24 ENCOUNTER — Ambulatory Visit: Payer: Self-pay | Admitting: Pharmacist

## 2019-11-24 ENCOUNTER — Other Ambulatory Visit: Payer: Self-pay | Admitting: Pharmacist

## 2019-11-24 NOTE — Patient Outreach (Addendum)
[facility]) Care Management  11/24/2019  Sara Tran [birth year] [id number]   Patient was called regarding medicaiton assistance. Unfortunately, she did not answer her phone.  HIPAA compliant message was left on her voicemail.   Patient confirmed receiving 4 boxes of Trulicity from [employer] and her inhalers from [facility].  Plan: Await a call back from the patient. Call patient back in 3-4 months for 2021 med assistance eval.  Elayne Guerin, PharmD, [place] Clinical Pharmacist [phone]  ADDENDUM  Patient called back. HIPAA identifiers were obtained.  Patient reported feeling well and very appreciative for the help with her medications.  Patient is a 63 year old female with multiple medical conditions including but not limited to:  Chronic back pain, CHF, type 2 diabetes, IBS, morbid obesity, sleep apnea, and pulmonary hypertension.  Patient received Symbicort and Trulicity from patient assistance programs.   She can be sent a new application for Trulicity for 0254 but has to spend at least 2% of her income to receive Symbicort from [place].    Hemoglobin A1c- 6.3% from 12/2018 6% 05/27/2019 [facility])  On pravastatin last filled: 08/19/2019  Patient's medications were reviewed:  Medications Reviewed Today    Reviewed by Elayne Guerin, [facility] (Pharmacist) on 11/24/19 at [place] List Status: <None>  Medication Order Taking? Sig Documenting Provider Last Dose Status Informant  albuterol (PROVENTIL HFA;VENTOLIN HFA) 108 (90 BASE) MCG/ACT inhaler [id number] Yes Inhale 2 puffs into the lungs every 6 (six) hours as needed. FOR SHORTNESS OF BREATH/WHEEZING [provider] Taking Active Self  albuterol (PROVENTIL) (2.5 MG/3ML) 0.083% nebulizer solution [id number] Yes Take 2.5 mg by nebulization every 4 (four) hours as needed for wheezing or shortness of breath.  [provider] Taking Active Self           Med Note [name], JESSICA R   Tue Jul 23, 2017  2:17 PM)    amLODipine (NORVASC) 10 MG tablet [id number] Yes Take 1 tablet by mouth every evening.  [provider] Taking Active Self  baclofen (LIORESAL) 10 MG tablet [id number] Yes Take 1 tablet (10 mg total) by mouth 3 (three) times daily as needed for muscle spasms. Meredith Staggers, MD Taking Active Self  clobetasol (TEMOVATE) 0.05 % external solution [id number] Yes Apply to scalp daily [provider] Taking Active   EPINEPHrine 0.3 mg/0.3 mL IJ SOAJ injection [id number] Yes Use as needed [provider] Taking Active   fluticasone (FLONASE) 50 MCG/ACT nasal spray [id number] Yes Place 1-2 sprays into the nose daily.  [provider] Taking Active Self  furosemide (LASIX) 40 MG tablet [id number] Yes Take 1 tablet by mouth daily as needed. [provider] Taking Active   gabapentin (NEURONTIN) 600 MG tablet [id number] Yes Take 1 tablet by mouth 3 (three) times daily. [provider] Taking Active   isosorbide mononitrate (IMDUR) 30 MG 24 hr tablet [id number] Yes Take 30 mg by mouth every morning.  [provider] Taking Active Self  loratadine (CLARITIN) 10 MG tablet [id number] Yes Take 1 tablet by mouth daily as needed for allergies.  [provider] Taking Active Self  magnesium oxide (MAG-OX) 400 MG tablet [id number] Yes Take 400 mg by mouth 2 (two) times daily.  [provider] Taking Active Self  Multiple Vitamins-Minerals (ONE-A-DAY 50 PLUS PO) [id number] Yes Take 1 tablet by mouth daily. [provider] Taking Active Self  nitroGLYCERIN (NITROSTAT) 0.4 MG SL tablet [id number] Yes Place 0.4 mg  under the tongue every 5 (five) minutes as needed. Reported on 06/14/2016 [provider] Taking Active Self  omega-3 acid ethyl esters (LOVAZA) 1 g capsule [id number] Yes Take 1 g by mouth 2 (two) times daily.  [provider] Taking Active Self  ondansetron (ZOFRAN) 4 MG tablet [id number] Yes Take 4 mg  by mouth 4 (four) times daily. [provider] Taking Active   oxyCODONE-acetaminophen (PERCOCET) 10-325 MG tablet [id number]  Take 1 tablet by mouth 4 (four) times daily as needed for pain.  [provider]  Active Self  polyethylene glycol Merril Abbe / GLYCOLAX) packet [id number] Yes Take 17 g by mouth 2 (two) times daily. Bonnell Public, MD Taking Active   potassium chloride SA (K-DUR) 20 MEQ tablet [id number] Yes Take 1 tablet by mouth daily. [provider] Taking Active   pravastatin (PRAVACHOL) 20 MG tablet [id number] Yes Take 1 tablet by mouth daily. [provider] Taking Active   psyllium (METAMUCIL) 58.6 % packet [id number] Yes Take 1 packet by mouth daily. [provider] Taking Active   SYMBICORT 160-4.5 MCG/ACT inhaler [id number] Yes Take 2 puffs by mouth 2 (two) times daily. [provider] Taking Active   TRULICITY 1.5 FG/1.8EX SOPN [id number] Yes Inject 1.5 mg into the skin every 7 (seven) days. [provider] Taking Active Self           Med Note Eulas Post, CRYSTAL D   Thu Jan 29, 2019  5:16 AM) On fridays          Plan: Send patient a new application for Trulicity for 9381. Call patient back in 3-4 months for 2021 Patient Assistance follow up.  Elayne Guerin, PharmD, [place] Clinical Pharmacist [phone]

## 2019-11-30 ENCOUNTER — Other Ambulatory Visit: Payer: Self-pay

## 2019-11-30 NOTE — Patient Outreach (Signed)
[facility]) Care Management  11/30/2019   Sara Tran [birth year] [id number]  Initial Assessment Telephone Screen  Referral Date: 11/20/2019 Referral Source: eye screening done on 11/18/2019 at [facility] Associates Referral Reason: "ongoing disease mgmt" Insurance: [employer]   Outreach attempt #2 to patient. Spoke with patient. Discussed disease mgmt program for continued education and support of chronic conditions. Patient gave verbal consent  Social: She resides in her home alone. She reports that she is independent with ADLs/IADLs. She reports she had a fall about a a few weeks ago and she injured her hand. She is seeing sports medicine MD for mgmt of this. She drives herself to MD appts. patient states that she is the main caregiver for her father Silverio Decamp has medical issues and lives a few minutes away from her.  Conditions: Per chart review, patient has PMH that includes but not limited to Diabetes(A1C 6.0-May 2020), COPD, OSA and obesity. Patient reports that she is checking her cbgs about once a day but admits she is supposed to be checking them about 3-4x/day. She is unable to provide rationale for not doing except that she can tell "when her blood sugars are off." She reports good appetite. She voices some off and on issues with constipation and is taking meds for it.    Medications: Med review competed with patient. She denies any issues managing and/or affording her meds at present. She uses [employer] order for some of her meds.   Current Medications:  Current Outpatient Medications  Medication Sig Dispense Refill  . albuterol (PROVENTIL HFA;VENTOLIN HFA) 108 (90 BASE) MCG/ACT inhaler Inhale 2 puffs into the lungs every 6 (six) hours as needed. FOR SHORTNESS OF BREATH/WHEEZING    . albuterol (PROVENTIL) (2.5 MG/3ML) 0.083% nebulizer solution Take 2.5 mg by nebulization every 4 (four) hours as needed for wheezing or shortness of breath.     Marland Kitchen  amLODipine (NORVASC) 10 MG tablet Take 1 tablet by mouth every evening.     . baclofen (LIORESAL) 10 MG tablet Take 1 tablet (10 mg total) by mouth 3 (three) times daily as needed for muscle spasms. 45 tablet 3  . clobetasol (TEMOVATE) 0.05 % external solution Apply to scalp daily    . EPINEPHrine 0.3 mg/0.3 mL IJ SOAJ injection Use as needed    . fluticasone (FLONASE) 50 MCG/ACT nasal spray Place 1-2 sprays into the nose daily.     . furosemide (LASIX) 40 MG tablet Take 1 tablet by mouth daily as needed.    . gabapentin (NEURONTIN) 600 MG tablet Take 1 tablet by mouth 3 (three) times daily.    . isosorbide mononitrate (IMDUR) 30 MG 24 hr tablet Take 30 mg by mouth every morning.     . loratadine (CLARITIN) 10 MG tablet Take 1 tablet by mouth daily as needed for allergies.     . magnesium oxide (MAG-OX) 400 MG tablet Take 400 mg by mouth 2 (two) times daily.     . Multiple Vitamins-Minerals (ONE-A-DAY 50 PLUS PO) Take 1 tablet by mouth daily.    . nitroGLYCERIN (NITROSTAT) 0.4 MG SL tablet Place 0.4 mg under the tongue every 5 (five) minutes as needed. Reported on 06/14/2016    . omega-3 acid ethyl esters (LOVAZA) 1 g capsule Take 1 g by mouth 2 (two) times daily.     . ondansetron (ZOFRAN) 4 MG tablet Take 4 mg by mouth 4 (four) times daily. prn    . oxyCODONE-acetaminophen (PERCOCET) 10-325 MG tablet  Take 1 tablet by mouth 4 (four) times daily as needed for pain.     . polyethylene glycol (MIRALAX / GLYCOLAX) packet Take 17 g by mouth 2 (two) times daily. (Patient not taking: Reported on 11/30/2019) 14 each 0  . potassium chloride SA (K-DUR) 20 MEQ tablet Take 1 tablet by mouth daily.    . pravastatin (PRAVACHOL) 20 MG tablet Take 1 tablet by mouth daily.    . psyllium (METAMUCIL) 58.6 % packet Take 1 packet by mouth daily.    . SYMBICORT 160-4.5 MCG/ACT inhaler Take 2 puffs by mouth 2 (two) times daily.    . TRULICITY 1.5 ZW/2.5EN SOPN Inject 1.5 mg into the skin every 7 (seven) days.     No  current facility-administered medications for this visit.     Functional Status:  In your present state of health, do you have any difficulty performing the following activities: 11/30/2019 01/29/2019  Hearing? N N  Vision? N N  Difficulty concentrating or making decisions? N N  Walking or climbing stairs? N N  Dressing or bathing? N N  Doing errands, shopping? N N  Preparing Food and eating ? N -  Using the Toilet? N -  In the past six months, have you accidently leaked urine? N -  Do you have problems with loss of bowel control? N -  Managing your Medications? N -  Managing your Finances? N -  Some recent data might be hidden    Fall/Depression Screening: Fall Risk  11/30/2019 08/13/2018 11/20/2017  Falls in the past year? 1 Yes No  Comment - - -  Number falls in past yr: 0 1 -  Injury with Fall? 1 No -  Risk for fall due to : History of fall(s);Medication side effect Impaired mobility;Impaired balance/gait;Other (Comment);History of fall(s) -  Risk for fall due to: Comment - - -  Follow up Falls evaluation completed;Education provided - -   PHQ 2/9 Scores 11/30/2019 01/17/2016 07/25/2015 07/25/2015  PHQ - 2 Score 0 0 0 0  PHQ- 9 Score - - 5 -    Assessment:  THN CM Care Plan Problem One     Most Recent Value  Care Plan Problem One  Knowledge deficit related to disease process and mgmt of Diabetes.  Role Documenting the Problem One  Care Management Telephonic Coordinator  Care Plan for Problem One  Active  [facility] Long Term Goal   Patient will maintain and report a A1C level of 6.0 or less over the next 90 days.  THN Long Term Goal Start Date  11/30/19  Interventions for Problem One Long Term Goal  RN CM discussed diabetes education with pt. RN CM reviewed with pt. importance of cbg monitoring. RN CM reviewed diabetic diet restricitons.   THN CM Short Term Goal #1   Patient will report consistently checking cbgs as ordered at least 3-4/day over the next 30 days.  THN CM Short  Term Goal #1 Start Date  11/30/19  Interventions for Short Term Goal #1  RN CM dsicussed with pt. importance of cbg monitoring. RN CM reviewed s/s of hypo/hyperglycemia. RN CM assessed med adherence. RN CM confirmed pt has device in the home and knows how to check cbgs    [facility] CM Care Plan Problem Two     Most Recent Value  Care Plan Problem Two  Patient with hand injury and decreased range of motion  Role Documenting the Problem Two  Care Management Telephonic Coordinator  Care Plan for  Problem Two  Active  Interventions for Problem Two Long Term Goal   RN CM assesesed for pain and other sxs. RN CM instructed pt on s/s of worsening condition and when to seek medical attention.   THN Long Term Goal  Patient will report complete healing of hand injury oevr the next 90 days.  THN Long Term Goal Start Date  11/30/19  [facility] CM Short Term Goal #1   Patient will complete MD follow up appt for hand eval within the next 30 days.  THN CM Short Term Goal #1 Start Date  11/30/19  Interventions for Short Term Goal #2   RN CM confirmed appt. RNCM assessed for any barriers to getting to appt.  THN CM Short Term Goal #2   Patient will report an acceptable level of pain(score of 3 or less) over the next 30 days.  THN CM Short Term Goal #2 Start Date  11/30/19  Interventions for Short Term Goal #2  Pain assessment complted. RN CM reviewed pharmacological and non-pharmacological ways to manage pain.     Plan:  RN CM discussed with patient next outreach within the month of December.. Patient gave verbal consent and in agreement with RN CM follow up timeframe. Patient aware that they may contact RN CM sooner for any issues or concerns. RN CM will send welcome letter to patient. RN CM will send barriers letter and route encounter to PCP.  Enzo Montgomery, RN,BSN,CCM [place] Management Telephonic Care Management Coordinator Direct Phone: [phone] Toll Free: [phone] Fax: [phone]

## 2019-12-08 DIAGNOSIS — Z9884 Bariatric surgery status: Secondary | ICD-10-CM | POA: Diagnosis not present

## 2019-12-08 DIAGNOSIS — N390 Urinary tract infection, site not specified: Secondary | ICD-10-CM | POA: Diagnosis not present

## 2019-12-08 DIAGNOSIS — I10 Essential (primary) hypertension: Secondary | ICD-10-CM | POA: Diagnosis not present

## 2019-12-08 DIAGNOSIS — E119 Type 2 diabetes mellitus without complications: Secondary | ICD-10-CM | POA: Diagnosis not present

## 2019-12-08 DIAGNOSIS — Z6841 Body Mass Index (BMI) 40.0 and over, adult: Secondary | ICD-10-CM | POA: Diagnosis not present

## 2019-12-08 DIAGNOSIS — E7849 Other hyperlipidemia: Secondary | ICD-10-CM | POA: Diagnosis not present

## 2019-12-08 DIAGNOSIS — E559 Vitamin D deficiency, unspecified: Secondary | ICD-10-CM | POA: Diagnosis not present

## 2019-12-08 DIAGNOSIS — G894 Chronic pain syndrome: Secondary | ICD-10-CM | POA: Diagnosis not present

## 2019-12-09 ENCOUNTER — Ambulatory Visit (INDEPENDENT_AMBULATORY_CARE_PROVIDER_SITE_OTHER): Payer: Medicare HMO | Admitting: Orthopedic Surgery

## 2019-12-09 ENCOUNTER — Encounter: Payer: Self-pay | Admitting: Orthopedic Surgery

## 2019-12-09 ENCOUNTER — Other Ambulatory Visit: Payer: Self-pay

## 2019-12-09 VITALS — BP 114/68 | HR 104 | Temp 96.4°F | Ht 60.0 in | Wt 280.0 lb

## 2019-12-09 DIAGNOSIS — M79641 Pain in right hand: Secondary | ICD-10-CM

## 2019-12-09 NOTE — Progress Notes (Signed)
Sara Tran  12/09/2019  Body mass index is 54.68 kg/m.   HISTORY SECTION :  Chief Complaint  Patient presents with  . Hand Pain    right hand injury 11/05/2019 at [facility] done    HPI The patient presents for evaluation of  (mild/moderate/severe/ ) pain and paresthesias right hand after hand was crushed between a truck and a cart at [place] as she was leaving the store.  She complains of 1 month history of numbness and tingling in the small ring and long finger and a tremor in her right dominant hand  Review of Systems  Eyes: Positive for redness.  Respiratory: Positive for shortness of breath and wheezing.   Cardiovascular: Positive for leg swelling.  Genitourinary: Positive for hematuria.  Musculoskeletal: Positive for back pain, falls, joint pain, myalgias and neck pain.  Neurological: Positive for weakness.  Endo/Heme/Allergies: Positive for environmental allergies. Bruises/bleeds easily.  All other systems reviewed and are negative.    has a past medical history of Adnexal cyst, Anginal pain ([place]), Anxiety, Arthritis, Asthma, Atrial fibrillation (HCC), CHF (congestive heart failure) (HCC), Chronic back pain, Chronic hip pain, COPD (chronic obstructive pulmonary disease) ([place]), Coronary artery disease, Dyspnea, GERD (gastroesophageal reflux disease), Headache, Heart murmur, Herpes simplex virus (HSV) infection, History of bronchitis, History of cardiac catheterization, History of kidney stones, Hot flashes, Hypertension, Hypertriglyceridemia, IBS (irritable bowel syndrome), Internal hemorrhoids, Morbid obesity ([place]), Myocardial infarction ([place]), Obesity, Pulmonary HTN ([place]), Renal insufficiency, Sciatica of right side, Sleep apnea, Type 2 diabetes mellitus ([place]), and Uterine fibroid.   Past Surgical History:  Procedure Laterality Date  . APPENDECTOMY    . CARDIAC CATHETERIZATION  07/2007, 05/2012   Normal coronary arteries  . COLONOSCOPY  10/18/10   SLF:2-mm sessile  sigmoid polyp/diverticula  . CYSTOSCOPY/URETEROSCOPY/HOLMIUM LASER/STENT PLACEMENT Right 08/01/2017   Procedure: CYSTOSCOPY/ RIGHT URETEROSCOPY/ RIGHT RETROGRADE/ HOLMIUM LASER  AND RIGHT STENT PLACEMENT FIRST STAGE;  Surgeon: Irine Seal, MD;  Location: [facility];  Service: Urology;  Laterality: Right;  . CYSTOSCOPY/URETEROSCOPY/HOLMIUM LASER/STENT PLACEMENT Right 08/08/2017   Procedure: RIGHT URETEROSCOPY WITH HOLMIUM LASER RIGHT STENT PLACEMENT;  Surgeon: Irine Seal, MD;  Location: [facility];  Service: Urology;  Laterality: Right;  . EXTRACORPOREAL SHOCK WAVE LITHOTRIPSY Right 08/22/2017   Procedure: RIGHT EXTRACORPOREAL SHOCK WAVE LITHOTRIPSY (ESWL);  Surgeon: Alexis Frock, MD;  Location: [facility];  Service: Urology;  Laterality: Right;  . HEMORRHOID BANDING  07/2012   Dr. Oneida Alar  . LAPAROSCOPIC CHOLECYSTECTOMY  1985  . LAPAROSCOPIC GASTRIC BANDING  12/12/2010  . LEFT AND RIGHT HEART CATHETERIZATION WITH CORONARY ANGIOGRAM N/A 06/03/2012   Procedure: LEFT AND RIGHT HEART CATHETERIZATION WITH CORONARY ANGIOGRAM;  Surgeon: Jolaine Artist, MD;  Location: [facility] CATH LAB;  Service: Cardiovascular;  Laterality: N/A;  . PILONIDAL CYST EXCISION  1974  . 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 TUMOR EXCISION  10/2011   Face    Body mass index is 54.68 kg/m.   Allergies  Allergen Reactions  . Ancef [Cefazolin] Anaphylaxis and Shortness Of Breath       . Bee Venom Anaphylaxis    As a child  . Aspirin Other (See Comments)    Rectal bleeding  . Ceftin [Cefuroxime Axetil] Swelling    Swollen tongue  . Ciprofloxacin Cough    Coughed up blood  . Macrodantin [Nitrofurantoin] Other (See Comments)    Vomited blood and had dark stool.  . Milk-Related Compounds Other (See Comments)    IBS  . Other  Snake venom - not sure of reaction -had problems with feet   . Latex Itching and Rash     Current Outpatient Medications:  .  albuterol (PROVENTIL HFA;VENTOLIN HFA) 108 (90 BASE) MCG/ACT inhaler,  Inhale 2 puffs into the lungs every 6 (six) hours as needed. FOR SHORTNESS OF BREATH/WHEEZING, Disp: , Rfl:  .  albuterol (PROVENTIL) (2.5 MG/3ML) 0.083% nebulizer solution, Take 2.5 mg by nebulization every 4 (four) hours as needed for wheezing or shortness of breath. , Disp: , Rfl:  .  amLODipine (NORVASC) 10 MG tablet, Take 1 tablet by mouth every evening. , Disp: , Rfl:  .  baclofen (LIORESAL) 10 MG tablet, Take 1 tablet (10 mg total) by mouth 3 (three) times daily as needed for muscle spasms., Disp: 45 tablet, Rfl: 3 .  clobetasol (TEMOVATE) 0.05 % external solution, Apply to scalp daily, Disp: , Rfl:  .  EPINEPHrine 0.3 mg/0.3 mL IJ SOAJ injection, Use as needed, Disp: , Rfl:  .  fluticasone (FLONASE) 50 MCG/ACT nasal spray, Place 1-2 sprays into the nose daily. , Disp: , Rfl:  .  furosemide (LASIX) 40 MG tablet, Take 1 tablet by mouth daily as needed., Disp: , Rfl:  .  gabapentin (NEURONTIN) 600 MG tablet, Take 1 tablet by mouth 3 (three) times daily., Disp: , Rfl:  .  isosorbide mononitrate (IMDUR) 30 MG 24 hr tablet, Take 30 mg by mouth every morning. , Disp: , Rfl:  .  loratadine (CLARITIN) 10 MG tablet, Take 1 tablet by mouth daily as needed for allergies. , Disp: , Rfl:  .  magnesium oxide (MAG-OX) 400 MG tablet, Take 400 mg by mouth 2 (two) times daily. , Disp: , Rfl:  .  Multiple Vitamins-Minerals (ONE-A-DAY 50 PLUS PO), Take 1 tablet by mouth daily., Disp: , Rfl:  .  nitroGLYCERIN (NITROSTAT) 0.4 MG SL tablet, Place 0.4 mg under the tongue every 5 (five) minutes as needed. Reported on 06/14/2016, Disp: , Rfl:  .  ondansetron (ZOFRAN) 4 MG tablet, Take 4 mg by mouth 4 (four) times daily. prn, Disp: , Rfl:  .  oxyCODONE-acetaminophen (PERCOCET) 10-325 MG tablet, Take 1 tablet by mouth 4 (four) times daily as needed for pain. , Disp: , Rfl:  .  potassium chloride SA (K-DUR) 20 MEQ tablet, Take 1 tablet by mouth daily., Disp: , Rfl:  .  pravastatin (PRAVACHOL) 20 MG tablet, Take 1 tablet  by mouth daily., Disp: , Rfl:  .  psyllium (METAMUCIL) 58.6 % packet, Take 1 packet by mouth daily., Disp: , Rfl:  .  SYMBICORT 160-4.5 MCG/ACT inhaler, Take 2 puffs by mouth 2 (two) times daily., Disp: , Rfl:  .  omega-3 acid ethyl esters (LOVAZA) 1 g capsule, Take 1 g by mouth 2 (two) times daily. , Disp: , Rfl:  .  TRULICITY 1.5 ZW/2.5EN SOPN, Inject 1.5 mg into the skin every 7 (seven) days., Disp: , Rfl:    PHYSICAL EXAM SECTION: 1) BP 114/68   Pulse (!) 104   Temp (!) 96.4 F (35.8 C)   Ht 5' (1.524 m)   Wt 280 lb (127 kg)   LMP 06/06/2013   BMI 54.68 kg/m   Body mass index is 54.68 kg/m. General appearance: Well-developed well-nourished no gross deformities  2) Cardiovascular normal pulse and perfusion in the upper  extremities normal color without edema  3) Neurologically deep tendon reflexes are equal and normal, no sensation loss or deficits no pathologic reflexes  4) Psychological: Awake alert and  oriented x3 mood and affect normal  5) Skin no lacerations or ulcerations no nodularity no palpable masses, no erythema or nodularity  6) Musculoskeletal:   The left hand looks normal.  There is no tenderness the range of motion is good the ligaments are stable throughout the hand and the tendon function is normal  The right hand on the dorsum has a area of darkness on the top of the hand which she says came from another incident when she was a child.  She has tremor at rest she can open and close the hand normal tendon function is intact there appears to be some kind of sensory changes in the small ring and long finger but the color is normal   MEDICAL DECISION SECTION:  Encounter Diagnosis  Name Primary?  . Pain in right hand Yes    Imaging An x-ray was done at the hospital I read this as normal alignment of the hand with no arthritis  Plan:  (Rx., Inj., surg., Frx, MRI/CT, XR:2)  Recommend nerve conduction study 6 weeks post injury to see if there is any nerve  damage if not then observation would be in order.  3:43 PM Amy Littrell, RT  12/09/2019

## 2019-12-09 NOTE — Patient Instructions (Addendum)
You will get a call from Dr Ernestina Patches for the nerve study he is in [place] at [facility] [phone] is the phone number / his address is [address] [place]   Dr Aline Brochure will call you with the results

## 2019-12-28 ENCOUNTER — Other Ambulatory Visit: Payer: Self-pay

## 2019-12-28 NOTE — Patient Outreach (Signed)
[facility] [facility]) Care Management  12/28/2019  Sara Tran 1956-05-07 [id number]   Telephone Assessment    Outreach attempt #1 to patient.Spoke with patient who denies any acute issues or concerns at this time. She reports that she had a quiet and good holiday recently. She went to see specialist regarding her hand injury. They have referred her to a "nerve specialist" and she has an appt on 01/09/2020. Patient states that MD discussed with her possible hand surgery. Patient is adamant that she does not want to have any type of surgery as she knows she is high risk given her multiple medical conditions. She reports that she is doing some hand exercises which seems to help at times. Appetite remains good. Blood sugars have been WNL for patient. She reports that she mailed Trulicity patient assistance application back and is awaiting to hear if she gets approved. She denies any RN CM needs or concerns at this time.    THN CM Care Plan Problem One     Most Recent Value  Care Plan Problem One  Knowledge deficit related to disease process and mgmt of Diabetes.  Role Documenting the Problem One  Care Management Telephonic Coordinator  Care Plan for Problem One  Active  [facility] Long Term Goal   Patient will maintain and report a A1C level of 6.0 or less over the next 90 days.  [facility]N Long Term Goal Start Date  11/30/19  Interventions for Problem One Long Term Goal  RN CM assessed cbg monitoring and values. RN CM reviewed diabetic diet.  THN CM Short Term Goal #1   Patient will report consistently checking cbgs as ordered at least 3-4/day over the next 30 days.  THN CM Short Term Goal #1 Start Date  11/30/19  THN CM Short Term Goal #1 Met Date  12/28/19    [facility] CM Care Plan Problem Two     Most Recent Value  Care Plan Problem Two  Patient with hand injury and decreased range of motion  Role Documenting the Problem Two  Care Management Telephonic Coordinator  Care Plan for Problem Two   Active  THN Long Term Goal  Patient will report complete healing of hand injury oevr the next 90 days.  THN Long Term Goal Start Date  11/30/19  [facility] CM Short Term Goal #1   Patient will complete MD follow up appt for hand eval within the next 30 days.  THN CM Short Term Goal #1 Start Date  11/30/19  Interventions for Short Term Goal #2   RN CM assessed for hand healing progress and discussed recent MD appt and recommendations.   THN CM Short Term Goal #2   Patient will report an acceptable level of pain(score of 3 or less) over the next 30 days.  THN CM Short Term Goal #2 Start Date  11/30/19  [facility] CM Short Term Goal #2 Met Date  12/28/19       Plan: RN CM discussed with patient next outreach within the month of January. Patient gave verbal consent and in agreement with RN CM follow up and timeframe. Patient aware that they may contact RN CM sooner for any issues or concerns.   Enzo Montgomery, RN,BSN,CCM [place] Management Telephonic Care Management Coordinator Direct Phone: [phone] Toll Free: [phone] Fax: [phone]

## 2019-12-29 ENCOUNTER — Ambulatory Visit: Payer: Self-pay

## 2019-12-30 DIAGNOSIS — E119 Type 2 diabetes mellitus without complications: Secondary | ICD-10-CM | POA: Diagnosis not present

## 2019-12-30 DIAGNOSIS — M1991 Primary osteoarthritis, unspecified site: Secondary | ICD-10-CM | POA: Diagnosis not present

## 2019-12-30 DIAGNOSIS — I10 Essential (primary) hypertension: Secondary | ICD-10-CM | POA: Diagnosis not present

## 2019-12-30 DIAGNOSIS — E782 Mixed hyperlipidemia: Secondary | ICD-10-CM | POA: Diagnosis not present

## 2020-01-13 ENCOUNTER — Encounter: Payer: Medicare HMO | Admitting: Physical Medicine and Rehabilitation

## 2020-01-20 ENCOUNTER — Other Ambulatory Visit: Payer: Self-pay | Admitting: Pharmacy Technician

## 2020-01-20 NOTE — Patient Outreach (Signed)
[place] [facility]) Care Management  01/20/2020  Sara Tran [birth year] [id number]   Successful outreach call placed to patient in regards to a voicemail she left for me inquiring if we had heard anything about her renewal [employer] application [place] for Trulicity.  Informed patient that Ralph Leyden will send her a determination letter in the mail. They will also fax a determination letter to her provider's office. Inquired if patient submitted an updated prescription with her renewal application and patient informed "I submitted the renewal application. Informed patient that she may want to call her provider's office and ask them to submit a new updated prescription to [place]. Inquired if patient had the phone/fax numbers to [place] and patient informed she believed she had them at her home but she was not there presently and did not have a way to write down the information at the time of the call. Confirmed patient had name and number if she wanted to call back for the phone and fax numbers for [employer].  Aryaan Persichetti P. Dainelle Hun, [place] Management [phone]

## 2020-01-25 ENCOUNTER — Other Ambulatory Visit: Payer: Self-pay

## 2020-01-25 NOTE — Patient Outreach (Signed)
[place] [facility]) Care Management  01/25/2020  Sara Tran 1956-04-15 [id number]   Telephone Assessment    Outreach attempt to patient. No answer. RN CM left HIPAA compliant voicemail message along with contact info.     Plan: RN CM will make outreach attempt to patient within the month of February.   Enzo Montgomery, RN,BSN,CCM [place] Management Telephonic Care Management Coordinator Direct Phone: [phone] Toll Free: [phone] Fax: [phone]

## 2020-01-26 ENCOUNTER — Ambulatory Visit: Payer: Self-pay

## 2020-02-01 DIAGNOSIS — Z1389 Encounter for screening for other disorder: Secondary | ICD-10-CM | POA: Diagnosis not present

## 2020-02-01 DIAGNOSIS — Z6841 Body Mass Index (BMI) 40.0 and over, adult: Secondary | ICD-10-CM | POA: Diagnosis not present

## 2020-02-01 DIAGNOSIS — R8290 Unspecified abnormal findings in urine: Secondary | ICD-10-CM | POA: Diagnosis not present

## 2020-02-01 DIAGNOSIS — I10 Essential (primary) hypertension: Secondary | ICD-10-CM | POA: Diagnosis not present

## 2020-02-01 DIAGNOSIS — Z0001 Encounter for general adult medical examination with abnormal findings: Secondary | ICD-10-CM | POA: Diagnosis not present

## 2020-02-01 DIAGNOSIS — E7849 Other hyperlipidemia: Secondary | ICD-10-CM | POA: Diagnosis not present

## 2020-02-01 DIAGNOSIS — M170 Bilateral primary osteoarthritis of knee: Secondary | ICD-10-CM | POA: Diagnosis not present

## 2020-02-02 ENCOUNTER — Ambulatory Visit (INDEPENDENT_AMBULATORY_CARE_PROVIDER_SITE_OTHER): Payer: Medicare HMO | Admitting: Physical Medicine and Rehabilitation

## 2020-02-02 ENCOUNTER — Other Ambulatory Visit: Payer: Self-pay

## 2020-02-02 DIAGNOSIS — R202 Paresthesia of skin: Secondary | ICD-10-CM | POA: Diagnosis not present

## 2020-02-02 NOTE — Progress Notes (Signed)
Numeric Pain Rating Scale and Functional Assessment Average Pain 9   In the last MONTH (on 0-10 scale) has pain interfered with the following?  1. General activity like being  able to carry out your everyday physical activities such as walking, climbing stairs, carrying groceries, or moving a chair?  Rating(9)

## 2020-02-02 NOTE — Procedures (Signed)
EMG & NCV Findings: Evaluation of the right median motor nerve showed prolonged distal onset latency (4.5 ms) and decreased conduction velocity (Elbow-Wrist, 49 m/s).  The right median (across palm) sensory nerve showed no response (Palm) and prolonged distal peak latency (4.4 ms).  All remaining nerves (as indicated in the following tables) were within normal limits.    All examined muscles (as indicated in the following table) showed no evidence of electrical instability.    Impression: The above electrodiagnostic study is ABNORMAL and reveals evidence of a moderate right median nerve entrapment at the wrist affecting sensory and motor components.   There is no significant electrodiagnostic evidence of any other focal nerve entrapment, brachial plexopathy or cervical radiculopathy.   Recommendations: 1.  Follow-up with referring physician. 2.  Continue current management of symptoms.  ___________________________ Laurence Spates FAAPMR Board Certified, American Board of Physical Medicine and Rehabilitation    Nerve Conduction Studies Anti Sensory Summary Table   Stim Site NR Peak (ms) Norm Peak (ms) P-T Amp (V) Norm P-T Amp Site1 Site2 Delta-P (ms) Dist (cm) Vel (m/s) Norm Vel (m/s)  Right Median Acr Palm Anti Sensory (2nd Digit)  28C  Wrist    *4.4 <3.6 22.3 >10 Wrist Palm  0.0    Palm *NR  <2.0          Right Radial Anti Sensory (Base 1st Digit)  29.2C  Wrist    2.1 <3.1 20.9  Wrist Base 1st Digit 2.1 0.0    Right Ulnar Anti Sensory (5th Digit)  29.2C  Wrist    3.1 <3.7 16.5 >15.0 Wrist 5th Digit 3.1 14.0 45 >38   Motor Summary Table   Stim Site NR Onset (ms) Norm Onset (ms) O-P Amp (mV) Norm O-P Amp Site1 Site2 Delta-0 (ms) Dist (cm) Vel (m/s) Norm Vel (m/s)  Right Median Motor (Abd Poll Brev)  29.6C  Wrist    *4.5 <4.2 9.6 >5 Elbow Wrist 4.0 19.5 *49 >50  Elbow    8.5  9.6         Right Ulnar Motor (Abd Dig Min)  29.9C  Wrist    2.9 <4.2 8.6 >3 B Elbow Wrist 3.2 19.0 59  >53  B Elbow    6.1  8.8  A Elbow B Elbow 1.6 10.0 62 >53  A Elbow    7.7  4.2          EMG   Side Muscle Nerve Root Ins Act Fibs Psw Amp Dur Poly Recrt Int Fraser Din Comment  Right Abd Poll Brev Median C8-T1 Nml Nml Nml Nml Nml 0 Nml Nml   Right 1stDorInt Ulnar C8-T1 Nml Nml Nml Nml Nml 0 Nml Nml   Right PronatorTeres Median C6-7 Nml Nml Nml Nml Nml 0 Nml Nml   Right Biceps Musculocut C5-6 Nml Nml Nml Nml Nml 0 Nml Nml   Right Deltoid Axillary C5-6 Nml Nml Nml Nml Nml 0 Nml Nml     Nerve Conduction Studies Anti Sensory Left/Right Comparison   Stim Site L Lat (ms) R Lat (ms) L-R Lat (ms) L Amp (V) R Amp (V) L-R Amp (%) Site1 Site2 L Vel (m/s) R Vel (m/s) L-R Vel (m/s)  Median Acr Palm Anti Sensory (2nd Digit)  28C  Wrist  *4.4   22.3  Wrist Palm     Palm             Radial Anti Sensory (Base 1st Digit)  29.2C  Wrist  2.1   20.9  Wrist Base 1st Digit     Ulnar Anti Sensory (5th Digit)  29.2C  Wrist  3.1   16.5  Wrist 5th Digit  45    Motor Left/Right Comparison   Stim Site L Lat (ms) R Lat (ms) L-R Lat (ms) L Amp (mV) R Amp (mV) L-R Amp (%) Site1 Site2 L Vel (m/s) R Vel (m/s) L-R Vel (m/s)  Median Motor (Abd Poll Brev)  29.6C  Wrist  *4.5   9.6  Elbow Wrist  *49   Elbow  8.5   9.6        Ulnar Motor (Abd Dig Min)  29.9C  Wrist  2.9   8.6  B Elbow Wrist  59   B Elbow  6.1   8.8  A Elbow B Elbow  62   A Elbow  7.7   4.2           Waveforms:

## 2020-02-02 NOTE — Progress Notes (Signed)
Sara Tran - 64 y.o. female MRN [id number]  Date of birth: 03/13/1956  Office Visit Note: Visit Date: 02/02/2020 PCP: Sharilyn Sites, MD Referred by: Sharilyn Sites, MD  Subjective: Chief Complaint  Patient presents with  . Right Arm - Pain   HPI: Sara Tran is a 64 y.o. female who comes in today At the request of Dr. Arther Abbott for electrodiagnostic study of the right upper limb.  Patient is right-hand dominant with a history of trauma to the hand approximately 2 months ago.  Since that time she has had pain and numbness with tingling and stiffness.  The numbness is not particularly the third fourth and fifth digit and more fifth digit than the other 2.  She reports her hand was caught between a van in a basket.  She says symptoms are getting worse and nothing really has helped at this point.  She denies left-sided symptoms.  She says she has a history of convulsions and every so often in the office today but she does get a visible jolt or jerk.  I do not see any specific hand tremor although this was noted in Dr. Ruthe Mannan note.  She has not had prior carpal tunnel surgery.  She has multiple medical issues and multiple allergies.  Her case is complicated by morbid obesity and diabetes.  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  I did not see those listed in the chart or any imaging.  ROS Otherwise per HPI.  Assessment & Plan: Visit Diagnoses:  1. Paresthesia of skin     Plan: Impression: The above electrodiagnostic study is ABNORMAL and reveals evidence of a moderate right median nerve entrapment at the wrist affecting sensory and motor components.   There is no significant electrodiagnostic evidence of any other focal nerve entrapment, brachial plexopathy or cervical radiculopathy.   Recommendations: 1.  Follow-up with referring physician. 2.  Continue current management of symptoms.   Meds & Orders: No orders of the  defined types were placed in this encounter.   Orders Placed This Encounter  Procedures  . NCV with EMG (electromyography)    Follow-up: Return for Arther Abbott, MD.   Procedures: No procedures performed  EMG & NCV Findings: Evaluation of the right median motor nerve showed prolonged distal onset latency (4.5 ms) and decreased conduction velocity (Elbow-Wrist, 49 m/s).  The right median (across palm) sensory nerve showed no response (Palm) and prolonged distal peak latency (4.4 ms).  All remaining nerves (as indicated in the following tables) were within normal limits.    All examined muscles (as indicated in the following table) showed no evidence of electrical instability.    Impression: The above electrodiagnostic study is ABNORMAL and reveals evidence of a moderate right median nerve entrapment at the wrist affecting sensory and motor components.   There is no significant electrodiagnostic evidence of any other focal nerve entrapment, brachial plexopathy or cervical radiculopathy.   Recommendations: 1.  Follow-up with referring physician. 2.  Continue current management of symptoms.  ___________________________ Laurence Spates FAAPMR Board Certified, American Board of Physical Medicine and Rehabilitation    Nerve Conduction Studies Anti Sensory Summary Table   Stim Site NR Peak (ms) Norm Peak (ms) P-T Amp (V) Norm P-T Amp Site1 Site2 Delta-P (ms) Dist (cm) Vel (m/s) Norm Vel (m/s)  Right Median Acr Palm Anti Sensory (2nd Digit)  28C  Wrist    *4.4 <3.6 22.3 >10 Wrist Palm  0.0    Palm *NR  <2.0          Right Radial Anti Sensory (Base 1st Digit)  29.2C  Wrist    2.1 <3.1 20.9  Wrist Base 1st Digit 2.1 0.0    Right Ulnar Anti Sensory (5th Digit)  29.2C  Wrist    3.1 <3.7 16.5 >15.0 Wrist 5th Digit 3.1 14.0 45 >38   Motor Summary Table   Stim Site NR Onset (ms) Norm Onset (ms) O-P Amp (mV) Norm O-P Amp Site1 Site2 Delta-0 (ms) Dist (cm) Vel (m/s) Norm Vel (m/s)    Right Median Motor (Abd Poll Brev)  29.6C  Wrist    *4.5 <4.2 9.6 >5 Elbow Wrist 4.0 19.5 *49 >50  Elbow    8.5  9.6         Right Ulnar Motor (Abd Dig Min)  29.9C  Wrist    2.9 <4.2 8.6 >3 B Elbow Wrist 3.2 19.0 59 >53  B Elbow    6.1  8.8  A Elbow B Elbow 1.6 10.0 62 >53  A Elbow    7.7  4.2          EMG   Side Muscle Nerve Root Ins Act Fibs Psw Amp Dur Poly Recrt Int Fraser Din Comment  Right Abd Poll Brev Median C8-T1 Nml Nml Nml Nml Nml 0 Nml Nml   Right 1stDorInt Ulnar C8-T1 Nml Nml Nml Nml Nml 0 Nml Nml   Right PronatorTeres Median C6-7 Nml Nml Nml Nml Nml 0 Nml Nml   Right Biceps Musculocut C5-6 Nml Nml Nml Nml Nml 0 Nml Nml   Right Deltoid Axillary C5-6 Nml Nml Nml Nml Nml 0 Nml Nml     Nerve Conduction Studies Anti Sensory Left/Right Comparison   Stim Site L Lat (ms) R Lat (ms) L-R Lat (ms) L Amp (V) R Amp (V) L-R Amp (%) Site1 Site2 L Vel (m/s) R Vel (m/s) L-R Vel (m/s)  Median Acr Palm Anti Sensory (2nd Digit)  28C  Wrist  *4.4   22.3  Wrist Palm     Palm             Radial Anti Sensory (Base 1st Digit)  29.2C  Wrist  2.1   20.9  Wrist Base 1st Digit     Ulnar Anti Sensory (5th Digit)  29.2C  Wrist  3.1   16.5  Wrist 5th Digit  45    Motor Left/Right Comparison   Stim Site L Lat (ms) R Lat (ms) L-R Lat (ms) L Amp (mV) R Amp (mV) L-R Amp (%) Site1 Site2 L Vel (m/s) R Vel (m/s) L-R Vel (m/s)  Median Motor (Abd Poll Brev)  29.6C  Wrist  *4.5   9.6  Elbow Wrist  *49   Elbow  8.5   9.6        Ulnar Motor (Abd Dig Min)  29.9C  Wrist  2.9   8.6  B Elbow Wrist  59   B Elbow  6.1   8.8  A Elbow B Elbow  62   A Elbow  7.7   4.2           Waveforms:             Clinical History: No specialty comments available.   She reports that she quit smoking about 23 years ago. Her smoking use included cigarettes. She has never used smokeless tobacco. No results for input(s): HGBA1C, LABURIC in the last 8760 hours.  Objective:  VS:  HT:    WT:   BMI:     BP:   HR:  bpm  TEMP: ( )  RESP:  Physical Exam Musculoskeletal:        General: No swelling, tenderness or deformity.     Comments: Inspection reveals darkened area on the dorsum of the hand with skin intact and no induration or swelling and no atrophy of the bilateral APB or FDI or hand intrinsics. There is no swelling, color changes or dystrophic changes. There is 5 out of 5 strength in the bilateral wrist extension, finger abduction and long finger flexion.  Sensation seems to be intact in all dermatomes but she does have a dysesthesia with a rubbing the fifth digit. This could be some allodynia. There is a negative Hoffmann's test bilaterally.  Skin:    General: Skin is warm and dry.     Findings: No erythema or rash.  Neurological:     General: No focal deficit present.     Mental Status: She is alert and oriented to person, place, and time.     Motor: No weakness or abnormal muscle tone.     Coordination: Coordination normal.  Psychiatric:        Mood and Affect: Mood normal.        Behavior: Behavior normal.     Ortho Exam Imaging: No results found.  Past Medical/Family/Surgical/Social History: Medications & Allergies reviewed per EMR, new medications updated. Patient Active Problem List   Diagnosis Date Noted  . Klebsiella pneumonia ([place]) 01/30/2019  . E. coli UTI (urinary tract infection) 01/30/2019  . Complicated UTI (urinary tract infection) 01/29/2019  . Levoscoliosis 11/20/2017  . Staghorn kidney stones 08/08/2017  . GI bleed 05/29/2017  . Sepsis due to urinary tract infection ([place]) 05/29/2017  . UTI (urinary tract infection) 05/29/2017  . Cervicalgia 11/21/2016  . Chronic pain syndrome 10/11/2015  . Lumbar radiculopathy 07/25/2015  . Fibroid 10/13/2013  . Adnexal cyst 10/13/2013  . PMB (postmenopausal bleeding) 10/06/2013  . Unspecified symptom associated with female genital organs 10/06/2013  . Hot flashes 10/06/2013  . Internal hemorrhoids with other complication  53/61/4431  . 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 Hip pain 04/09/2013  . Hemorrhoids, internal 03/12/2013  . Back pain 03/11/2013  . Encounter for therapeutic drug monitoring 03/11/2013  . Trochanteric bursitis 03/11/2013  . Lumbar facet arthropathy 03/11/2013  . Unstable angina ([place]) 06/02/2012  . Acute on chronic renal insufficiency 06/02/2012  . Chest pain 04/22/2012  . IBS (irritable bowel syndrome)-DIARRHEA 01/23/2012  . Morbid obesity ([place]) 08/09/2011  . Diabetes mellitus ([place]) 08/09/2011  . DIARRHEA 10/11/2010  . Chronic diastolic heart failure ([place]) 12/16/2007  . Pulmonary hypertension ([place]) 12/12/2007  . OSTEOARTHRITIS 12/11/2007  . SLEEP APNEA 12/11/2007   Past Medical History:  Diagnosis Date  . Adnexal cyst    Right simple cyst seen on [place]  . Anginal pain ([place])   . Anxiety   . Arthritis   . Asthma   . Atrial fibrillation (H[facility])    hx of   . CHF (congestive heart failure) ([place])   . Chronic back pain   . Chronic hip pain   . COPD (chronic obstructive pulmonary disease) ([place])   . Coronary artery disease    stents placed   . Dyspnea   . GERD (gastroesophageal reflux disease)   . Headache    due to pinched nerve damaage   . Heart murmur    hx of slight murmur   .  Herpes simplex virus (HSV) infection   . History of bronchitis   . History of cardiac catheterization    Normal coronaries 2013  . History of kidney stones   . Hot flashes   . Hypertension   . Hypertriglyceridemia   . IBS (irritable bowel syndrome)   . Internal hemorrhoids   . Morbid obesity ([place])    s/p lap band surgery  . Myocardial infarction ([place])    times 3  . Obesity   . Pulmonary HTN ([place])    [place] 2008:  RA pressures 13/10 with a mean of 7 mmHg.  RV pressure 62/13 with end diastolic pressure of 15 mmHg.  PA pressure 49/23 with a mean of 35 mmHg.  Pulmonary capillary wedge 17/50 with a mean of 14 mmHg. The PA saturation was 68%.  RA saturation was 71% and aortic saturation was 90%.   Cardiac output was 6.0 with a cardiac index of 2.80 by Fick.  Normal coronaries by cath 2008.  [place] Kitchen Renal insufficiency   . Sciatica of right side   . Sleep apnea    CPAP use does not know settings   . Type 2 diabetes mellitus ([place])    type II   . Uterine fibroid    Family History  Problem Relation Age of Onset  . Diabetes Mother   . Heart failure Mother   . Breast cancer Sister   . CAD Father   . Hypertension Father   . Cancer Paternal Grandfather   . Hypertension Paternal Grandmother   . Stroke Paternal Grandmother   . Other Maternal Grandmother        tonsilitis  . Diabetes Maternal Grandfather   . Hypertension Maternal Grandfather   . Breast cancer Paternal Aunt    Past Surgical History:  Procedure Laterality Date  . APPENDECTOMY    . CARDIAC CATHETERIZATION  07/2007, 05/2012   Normal coronary arteries  . COLONOSCOPY  10/18/10   SLF:2-mm sessile sigmoid polyp/diverticula  . CYSTOSCOPY/URETEROSCOPY/HOLMIUM LASER/STENT PLACEMENT Right 08/01/2017   Procedure: CYSTOSCOPY/ RIGHT URETEROSCOPY/ RIGHT RETROGRADE/ HOLMIUM LASER  AND RIGHT STENT PLACEMENT FIRST STAGE;  Surgeon: Irine Seal, MD;  Location: [facility];  Service: Urology;  Laterality: Right;  . CYSTOSCOPY/URETEROSCOPY/HOLMIUM LASER/STENT PLACEMENT Right 08/08/2017   Procedure: RIGHT URETEROSCOPY WITH HOLMIUM LASER RIGHT STENT PLACEMENT;  Surgeon: Irine Seal, MD;  Location: [facility];  Service: Urology;  Laterality: Right;  . EXTRACORPOREAL SHOCK WAVE LITHOTRIPSY Right 08/22/2017   Procedure: RIGHT EXTRACORPOREAL SHOCK WAVE LITHOTRIPSY (ESWL);  Surgeon: Alexis Frock, MD;  Location: [facility];  Service: Urology;  Laterality: Right;  . HEMORRHOID BANDING  07/2012   Dr. Oneida Alar  . LAPAROSCOPIC CHOLECYSTECTOMY  1985  . LAPAROSCOPIC GASTRIC BANDING  12/12/2010  . LEFT AND RIGHT HEART CATHETERIZATION WITH CORONARY ANGIOGRAM N/A 06/03/2012   Procedure: LEFT AND RIGHT HEART CATHETERIZATION WITH CORONARY ANGIOGRAM;  Surgeon: Jolaine Artist,  MD;  Location: [facility] CATH LAB;  Service: Cardiovascular;  Laterality: N/A;  . PILONIDAL CYST EXCISION  1974  . 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 TUMOR EXCISION  10/2011   Face   Social History   Occupational History  . Occupation: Volunteers  . 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 Smoking status: Former Smoker    Types: Cigarettes    Quit date: 08/08/1996    Years since quitting: 23.5  . Smokeless tobacco: Never Used  Substance and Sexual Activity  . Alcohol use:  No  . Drug use: No  . Sexual activity: Never    Birth control/protection: Post-menopausal

## 2020-02-03 ENCOUNTER — Telehealth: Payer: Self-pay | Admitting: Orthopedic Surgery

## 2020-02-03 NOTE — Telephone Encounter (Signed)
Patient called for results of nerve conduction study by Dr Ernestina Patches. Sara Tran thought she was to return to office; office note of 12/09/19 indicates Dr will call with results. Please advise. Ph# [phone]

## 2020-02-09 ENCOUNTER — Telehealth: Payer: Self-pay | Admitting: Radiology

## 2020-02-09 ENCOUNTER — Telehealth: Payer: Self-pay | Admitting: Orthopedic Surgery

## 2020-02-09 ENCOUNTER — Other Ambulatory Visit: Payer: Self-pay | Admitting: Pharmacy Technician

## 2020-02-09 NOTE — Telephone Encounter (Signed)
  Sara Tran fit for carpal tunnel bracing right hand    Impression: The above electrodiagnostic study is ABNORMAL and reveals evidence of a moderate right median nerve entrapment at the wrist affecting sensory and motor components.    There is no significant electrodiagnostic evidence of any other focal nerve entrapment, brachial plexopathy or cervical radiculopathy.

## 2020-02-09 NOTE — Telephone Encounter (Signed)
FYI, patient will stop by for carpal tunnel brace per Dr Aline Brochure

## 2020-02-09 NOTE — Patient Outreach (Signed)
[facility]) Care Management  02/09/2020  ANNELISA RYBACK 27-Mar-1956 [id number]    Return call placed to patient regarding voicemail in which she requested the  phone number for [facility], HIPAA identifiers verified.   Spoke to patient and provided her the phone number to [place] which is [phone].   Deja Pisarski P. Myles Tavella, [place] Management [phone]

## 2020-02-09 NOTE — Telephone Encounter (Signed)
-----   Message from Carole Civil, MD sent at 02/09/2020  2:07 PM EST ----- Fit for carpal tunnel they will come by the office

## 2020-02-10 DIAGNOSIS — E119 Type 2 diabetes mellitus without complications: Secondary | ICD-10-CM | POA: Diagnosis not present

## 2020-02-10 DIAGNOSIS — Z0100 Encounter for examination of eyes and vision without abnormal findings: Secondary | ICD-10-CM | POA: Diagnosis not present

## 2020-02-10 DIAGNOSIS — H5210 Myopia, unspecified eye: Secondary | ICD-10-CM | POA: Diagnosis not present

## 2020-02-11 ENCOUNTER — Telehealth: Payer: Self-pay | Admitting: Radiology

## 2020-02-11 DIAGNOSIS — G5601 Carpal tunnel syndrome, right upper limb: Secondary | ICD-10-CM | POA: Diagnosis not present

## 2020-02-11 NOTE — Telephone Encounter (Signed)
-----   Message from Carole Civil, MD sent at 02/09/2020  2:07 PM EST ----- Fit for carpal tunnel they will come by the office

## 2020-02-11 NOTE — Telephone Encounter (Signed)
She came by got the splint, and was asking about meds, she states you told her NSAIDs would be sent to [facility].

## 2020-02-12 ENCOUNTER — Other Ambulatory Visit: Payer: Self-pay | Admitting: Orthopedic Surgery

## 2020-02-12 MED ORDER — MELOXICAM 7.5 MG PO TABS: 7.5000 mg | ORAL_TABLET | Freq: Every day | ORAL | 5 refills | Status: DC

## 2020-02-22 ENCOUNTER — Other Ambulatory Visit: Payer: Self-pay

## 2020-02-22 NOTE — Patient Outreach (Signed)
[facility]) Care Management  02/22/2020  Sara Tran 1956-10-17 [id number]   Telephone Assessment    Outreach attempt # 2 to patient. No answer at present.      Plan: RN CM will make outreach attempt to patient within the month of April if no return call. RN CM will send unsuccessful outreach letter to patient.     Enzo Montgomery, RN,BSN,CCM [place] Management Telephonic Care Management Coordinator Direct Phone: [phone] Toll Free: [phone] Fax: [phone]

## 2020-02-23 ENCOUNTER — Ambulatory Visit: Payer: Self-pay

## 2020-02-25 ENCOUNTER — Telehealth: Payer: Self-pay | Admitting: Physical Medicine & Rehabilitation

## 2020-02-25 ENCOUNTER — Ambulatory Visit: Payer: Self-pay | Admitting: Pharmacist

## 2020-02-25 ENCOUNTER — Other Ambulatory Visit: Payer: Self-pay | Admitting: Pharmacist

## 2020-02-25 ENCOUNTER — Telehealth: Payer: Self-pay | Admitting: Orthopedic Surgery

## 2020-02-25 DIAGNOSIS — Z6841 Body Mass Index (BMI) 40.0 and over, adult: Secondary | ICD-10-CM | POA: Diagnosis not present

## 2020-02-25 DIAGNOSIS — S6991XS Unspecified injury of right wrist, hand and finger(s), sequela: Secondary | ICD-10-CM | POA: Diagnosis not present

## 2020-02-25 DIAGNOSIS — M79641 Pain in right hand: Secondary | ICD-10-CM | POA: Diagnosis not present

## 2020-02-25 NOTE — Telephone Encounter (Signed)
Patient called today stating that she has worn the splint out and wants to get another one.  She wants you to call her to see if you think that her insurance will pay for it.  Please call her at [phone]

## 2020-02-25 NOTE — Telephone Encounter (Signed)
Ms. Glodowski is having pain and spasms again, she would like to come back in to see you. She was last seen August of 2019, with multiple cancellations and no shows in history.  Please advise if we should schedule.

## 2020-02-25 NOTE — Patient Outreach (Signed)
[facility]) Care Management  02/25/2020  LYNDI HOLBEIN May 10, 1956 [id number]   Patient was called regarding medication assistance. HIPAA compliant message was left on her voicemail as she did not answer her phone.  Plan: Send unsuccessful outreach letter. Call patient back in 14-21 business days.  Elayne Guerin, PharmD, [place] Clinical Pharmacist [phone]

## 2020-02-26 NOTE — Telephone Encounter (Signed)
Did we discharge her from the clinic?

## 2020-02-26 NOTE — Telephone Encounter (Signed)
She was never discharged from the clinic.

## 2020-02-28 DIAGNOSIS — E119 Type 2 diabetes mellitus without complications: Secondary | ICD-10-CM | POA: Diagnosis not present

## 2020-02-28 DIAGNOSIS — E782 Mixed hyperlipidemia: Secondary | ICD-10-CM | POA: Diagnosis not present

## 2020-02-28 DIAGNOSIS — I10 Essential (primary) hypertension: Secondary | ICD-10-CM | POA: Diagnosis not present

## 2020-02-29 DIAGNOSIS — Z1231 Encounter for screening mammogram for malignant neoplasm of breast: Secondary | ICD-10-CM | POA: Diagnosis not present

## 2020-02-29 NOTE — Telephone Encounter (Signed)
Do we want to call and schedule?

## 2020-02-29 NOTE — Telephone Encounter (Signed)
No, it probably will not, She can get another, but will be billed, she states that is fine, will come by and get another.

## 2020-03-10 ENCOUNTER — Telehealth: Payer: Self-pay | Admitting: Radiology

## 2020-03-10 DIAGNOSIS — G5601 Carpal tunnel syndrome, right upper limb: Secondary | ICD-10-CM | POA: Diagnosis not present

## 2020-03-10 NOTE — Telephone Encounter (Signed)
Patient came by for another carpal tunnel brace She has carpal tunnel syndrome She states she has chosen to have acupuncture for the carpal tunnel instead of surgery  I advised it may not help much, but I would let Dr Aline Brochure know  To you FYI

## 2020-03-14 ENCOUNTER — Ambulatory Visit: Payer: Self-pay | Admitting: Pharmacist

## 2020-03-14 ENCOUNTER — Other Ambulatory Visit: Payer: Self-pay | Admitting: Pharmacist

## 2020-03-14 NOTE — Patient Outreach (Signed)
[facility]) Care Management  03/14/2020  FLORELLA MCNEESE 1956/10/23 [id number]   Patient was called regarding medication assistance with Symbicort and Trulicity. Unfortunately, she did not answer her phone. HIPAA compliant message was left on her voicemail.  Plan: Call patient back in 5-7 business days. Close case if I cannot get in touch with her.  Elayne Guerin, PharmD, [place] Clinical Pharmacist [phone]

## 2020-03-16 ENCOUNTER — Ambulatory Visit (HOSPITAL_COMMUNITY)
Admission: RE | Admit: 2020-03-16 | Discharge: 2020-03-16 | Disposition: A | Discharge status: Home / Self Care | Payer: Medicare HMO | Source: Ambulatory Visit | Attending: Internal Medicine | Admitting: Internal Medicine

## 2020-03-16 ENCOUNTER — Other Ambulatory Visit: Payer: Self-pay

## 2020-03-16 ENCOUNTER — Encounter (HOSPITAL_COMMUNITY): Payer: Self-pay | Admitting: Internal Medicine

## 2020-03-16 ENCOUNTER — Other Ambulatory Visit (HOSPITAL_COMMUNITY): Payer: Self-pay

## 2020-03-16 VITALS — BP 150/74 | HR 84 | Wt 291.4 lb

## 2020-03-16 DIAGNOSIS — Z87442 Personal history of urinary calculi: Secondary | ICD-10-CM | POA: Diagnosis not present

## 2020-03-16 DIAGNOSIS — I272 Pulmonary hypertension, unspecified: Secondary | ICD-10-CM | POA: Insufficient documentation

## 2020-03-16 DIAGNOSIS — G4733 Obstructive sleep apnea (adult) (pediatric): Secondary | ICD-10-CM | POA: Insufficient documentation

## 2020-03-16 DIAGNOSIS — Z87891 Personal history of nicotine dependence: Secondary | ICD-10-CM | POA: Insufficient documentation

## 2020-03-16 DIAGNOSIS — I2510 Atherosclerotic heart disease of native coronary artery without angina pectoris: Secondary | ICD-10-CM | POA: Insufficient documentation

## 2020-03-16 DIAGNOSIS — Z803 Family history of malignant neoplasm of breast: Secondary | ICD-10-CM | POA: Insufficient documentation

## 2020-03-16 DIAGNOSIS — Z7951 Long term (current) use of inhaled steroids: Secondary | ICD-10-CM | POA: Diagnosis not present

## 2020-03-16 DIAGNOSIS — Z955 Presence of coronary angioplasty implant and graft: Secondary | ICD-10-CM | POA: Insufficient documentation

## 2020-03-16 DIAGNOSIS — E781 Pure hyperglyceridemia: Secondary | ICD-10-CM | POA: Diagnosis not present

## 2020-03-16 DIAGNOSIS — Z9884 Bariatric surgery status: Secondary | ICD-10-CM | POA: Diagnosis not present

## 2020-03-16 DIAGNOSIS — R0789 Other chest pain: Secondary | ICD-10-CM

## 2020-03-16 DIAGNOSIS — Z833 Family history of diabetes mellitus: Secondary | ICD-10-CM | POA: Diagnosis not present

## 2020-03-16 DIAGNOSIS — I5032 Chronic diastolic (congestive) heart failure: Secondary | ICD-10-CM

## 2020-03-16 DIAGNOSIS — Z886 Allergy status to analgesic agent status: Secondary | ICD-10-CM | POA: Diagnosis not present

## 2020-03-16 DIAGNOSIS — I110 Hypertensive heart disease with heart failure: Secondary | ICD-10-CM | POA: Diagnosis not present

## 2020-03-16 DIAGNOSIS — Z91040 Latex allergy status: Secondary | ICD-10-CM | POA: Insufficient documentation

## 2020-03-16 DIAGNOSIS — Z8249 Family history of ischemic heart disease and other diseases of the circulatory system: Secondary | ICD-10-CM | POA: Insufficient documentation

## 2020-03-16 DIAGNOSIS — R079 Chest pain, unspecified: Secondary | ICD-10-CM | POA: Diagnosis not present

## 2020-03-16 DIAGNOSIS — K219 Gastro-esophageal reflux disease without esophagitis: Secondary | ICD-10-CM | POA: Insufficient documentation

## 2020-03-16 DIAGNOSIS — Z79899 Other long term (current) drug therapy: Secondary | ICD-10-CM | POA: Insufficient documentation

## 2020-03-16 DIAGNOSIS — Z6841 Body Mass Index (BMI) 40.0 and over, adult: Secondary | ICD-10-CM | POA: Insufficient documentation

## 2020-03-16 DIAGNOSIS — Z881 Allergy status to other antibiotic agents status: Secondary | ICD-10-CM | POA: Diagnosis not present

## 2020-03-16 DIAGNOSIS — K589 Irritable bowel syndrome without diarrhea: Secondary | ICD-10-CM | POA: Insufficient documentation

## 2020-03-16 DIAGNOSIS — I252 Old myocardial infarction: Secondary | ICD-10-CM | POA: Insufficient documentation

## 2020-03-16 DIAGNOSIS — E119 Type 2 diabetes mellitus without complications: Secondary | ICD-10-CM | POA: Insufficient documentation

## 2020-03-16 DIAGNOSIS — R0600 Dyspnea, unspecified: Secondary | ICD-10-CM

## 2020-03-16 DIAGNOSIS — R0609 Other forms of dyspnea: Secondary | ICD-10-CM

## 2020-03-16 DIAGNOSIS — F419 Anxiety disorder, unspecified: Secondary | ICD-10-CM | POA: Insufficient documentation

## 2020-03-16 DIAGNOSIS — J449 Chronic obstructive pulmonary disease, unspecified: Secondary | ICD-10-CM | POA: Insufficient documentation

## 2020-03-16 LAB — CBC
HCT: 43.2 % (ref 36.0–46.0)
Hemoglobin: 13.9 g/dL (ref 12.0–15.0)
MCH: 32 pg (ref 26.0–34.0)
MCHC: 32.2 g/dL (ref 30.0–36.0)
MCV: 99.5 fL (ref 80.0–100.0)
Platelets: 292 10*3/uL (ref 150–400)
RBC: 4.34 MIL/uL (ref 3.87–5.11)
RDW: 12.9 % (ref 11.5–15.5)
WBC: 12.1 10*3/uL — ABNORMAL HIGH (ref 4.0–10.5)
nRBC: 0 % (ref 0.0–0.2)

## 2020-03-16 LAB — PROTIME-INR
INR: 1 (ref 0.8–1.2)
Prothrombin Time: 12.9 seconds (ref 11.4–15.2)

## 2020-03-16 LAB — BASIC METABOLIC PANEL
Anion gap: 10 (ref 5–15)
BUN: 18 mg/dL (ref 8–23)
CO2: 25 mmol/L (ref 22–32)
Calcium: 9.1 mg/dL (ref 8.9–10.3)
Chloride: 102 mmol/L (ref 98–111)
Creatinine, Ser: 1.19 mg/dL — ABNORMAL HIGH (ref 0.44–1.00)
GFR calc Af Amer: 57 mL/min — ABNORMAL LOW (ref >60)
GFR calc non Af Amer: 49 mL/min — ABNORMAL LOW (ref >60)
Glucose, Bld: 134 mg/dL — ABNORMAL HIGH (ref 70–99)
Potassium: 4.7 mmol/L (ref 3.5–5.1)
Sodium: 137 mmol/L (ref 135–145)

## 2020-03-16 NOTE — Patient Instructions (Signed)
You are scheduled for a Heart Catheterization. See below for instructions  COVID Screen: You will need a pre procedure COVID test    WHEN: Friday, March 26th, 2021 anytime between 9am-3pm WHERE: [facility]       [place] [id number]  This is a drive thru testing site, you will remain in your car. Be sure to get in the line FOR PROCEDURES  Once you have been swabbed you will need to remain home in quarantine until you return for your procedure.    [facility] [place] [id number] [place] [id number] Dept: [phone] Loc: [phone]  ALEEN MARSTON  03/16/2020  You are scheduled for a Cardiac Catheterization on Monday, March 29 with Dr. Glori Bickers.  1. Please arrive at the [facility] (Main Entrance A) at [facility]: [address] [place], [place] [address] at 7:00 AM (This time is two hours before your procedure to ensure your preparation). Free valet parking service is available.   Special note: Every effort is made to have your procedure done on time. Please understand that emergencies sometimes delay scheduled procedures.  2. Diet: Do not eat or drink after midnight  3. Labs: Done today in office  4. Medication instructions in preparation for your procedure:   Contrast Allergy: No   HOLD ALL medications on the morning of procedure  HOLD Trulicity on the morning of your procedure   5. Plan for one night stay--bring personal belongings. 6. Bring a current list of your medications and current insurance cards. 7. You MUST have a responsible person to drive you home. 8. Someone MUST be with you the first 24 hours after you arrive home or your discharge will be delayed. 9. Please wear clothes that are easy to get on and off and wear slip-on shoes.  Thank you for allowing [place] to care for you!   -- [place] [facility]  [facility]
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
## 2020-03-17 ENCOUNTER — Telehealth (HOSPITAL_COMMUNITY): Payer: Self-pay | Admitting: *Deleted

## 2020-03-17 NOTE — Telephone Encounter (Signed)
R/L heart cath auth in clinical review   Case #[id number]

## 2020-03-22 DIAGNOSIS — G894 Chronic pain syndrome: Secondary | ICD-10-CM | POA: Diagnosis not present

## 2020-03-22 DIAGNOSIS — Z6841 Body Mass Index (BMI) 40.0 and over, adult: Secondary | ICD-10-CM | POA: Diagnosis not present

## 2020-03-22 DIAGNOSIS — S86912A Strain of unspecified muscle(s) and tendon(s) at lower leg level, left leg, initial encounter: Secondary | ICD-10-CM | POA: Diagnosis not present

## 2020-03-25 ENCOUNTER — Other Ambulatory Visit (HOSPITAL_COMMUNITY)
Admission: RE | Admit: 2020-03-25 | Discharge: 2020-03-25 | Disposition: A | Discharge status: Home / Self Care | Payer: Medicare HMO | Source: Ambulatory Visit | Attending: Internal Medicine | Admitting: Internal Medicine

## 2020-03-25 DIAGNOSIS — Z20822 Contact with and (suspected) exposure to covid-19: Secondary | ICD-10-CM | POA: Insufficient documentation

## 2020-03-25 DIAGNOSIS — Z01812 Encounter for preprocedural laboratory examination: Secondary | ICD-10-CM | POA: Diagnosis not present

## 2020-03-25 LAB — SARS CORONAVIRUS 2 (TAT 6-24 HRS): SARS Coronavirus 2: NEGATIVE

## 2020-03-28 ENCOUNTER — Other Ambulatory Visit: Payer: Self-pay | Admitting: Pharmacist

## 2020-03-28 ENCOUNTER — Other Ambulatory Visit: Payer: Self-pay

## 2020-03-28 ENCOUNTER — Ambulatory Visit (HOSPITAL_COMMUNITY)
Admission: RE | Admit: 2020-03-28 | Discharge: 2020-03-28 | Disposition: A | Discharge status: Home / Self Care | Payer: Medicare HMO | Attending: Internal Medicine | Admitting: Internal Medicine

## 2020-03-28 ENCOUNTER — Encounter (HOSPITAL_COMMUNITY)
Admission: RE | Disposition: A | Discharge status: Home / Self Care | Payer: Self-pay | Source: Home / Self Care | Attending: Internal Medicine

## 2020-03-28 DIAGNOSIS — I4891 Unspecified atrial fibrillation: Secondary | ICD-10-CM | POA: Diagnosis not present

## 2020-03-28 DIAGNOSIS — Z9884 Bariatric surgery status: Secondary | ICD-10-CM | POA: Diagnosis not present

## 2020-03-28 DIAGNOSIS — Z955 Presence of coronary angioplasty implant and graft: Secondary | ICD-10-CM | POA: Insufficient documentation

## 2020-03-28 DIAGNOSIS — F419 Anxiety disorder, unspecified: Secondary | ICD-10-CM | POA: Diagnosis not present

## 2020-03-28 DIAGNOSIS — Z886 Allergy status to analgesic agent status: Secondary | ICD-10-CM | POA: Diagnosis not present

## 2020-03-28 DIAGNOSIS — J449 Chronic obstructive pulmonary disease, unspecified: Secondary | ICD-10-CM | POA: Insufficient documentation

## 2020-03-28 DIAGNOSIS — I509 Heart failure, unspecified: Secondary | ICD-10-CM | POA: Insufficient documentation

## 2020-03-28 DIAGNOSIS — I110 Hypertensive heart disease with heart failure: Secondary | ICD-10-CM | POA: Insufficient documentation

## 2020-03-28 DIAGNOSIS — Z7951 Long term (current) use of inhaled steroids: Secondary | ICD-10-CM | POA: Diagnosis not present

## 2020-03-28 DIAGNOSIS — Z881 Allergy status to other antibiotic agents status: Secondary | ICD-10-CM | POA: Diagnosis not present

## 2020-03-28 DIAGNOSIS — Z79899 Other long term (current) drug therapy: Secondary | ICD-10-CM | POA: Diagnosis not present

## 2020-03-28 DIAGNOSIS — E781 Pure hyperglyceridemia: Secondary | ICD-10-CM | POA: Insufficient documentation

## 2020-03-28 DIAGNOSIS — I2721 Secondary pulmonary arterial hypertension: Secondary | ICD-10-CM | POA: Diagnosis not present

## 2020-03-28 DIAGNOSIS — I2510 Atherosclerotic heart disease of native coronary artery without angina pectoris: Secondary | ICD-10-CM | POA: Diagnosis not present

## 2020-03-28 DIAGNOSIS — G4733 Obstructive sleep apnea (adult) (pediatric): Secondary | ICD-10-CM | POA: Insufficient documentation

## 2020-03-28 DIAGNOSIS — I252 Old myocardial infarction: Secondary | ICD-10-CM | POA: Insufficient documentation

## 2020-03-28 DIAGNOSIS — K589 Irritable bowel syndrome without diarrhea: Secondary | ICD-10-CM | POA: Insufficient documentation

## 2020-03-28 DIAGNOSIS — K219 Gastro-esophageal reflux disease without esophagitis: Secondary | ICD-10-CM | POA: Insufficient documentation

## 2020-03-28 DIAGNOSIS — Z6841 Body Mass Index (BMI) 40.0 and over, adult: Secondary | ICD-10-CM | POA: Diagnosis not present

## 2020-03-28 DIAGNOSIS — E119 Type 2 diabetes mellitus without complications: Secondary | ICD-10-CM | POA: Diagnosis not present

## 2020-03-28 DIAGNOSIS — Z87891 Personal history of nicotine dependence: Secondary | ICD-10-CM | POA: Diagnosis not present

## 2020-03-28 DIAGNOSIS — I5032 Chronic diastolic (congestive) heart failure: Secondary | ICD-10-CM

## 2020-03-28 HISTORY — PX: RIGHT/LEFT HEART CATH AND CORONARY ANGIOGRAPHY: CATH118266

## 2020-03-28 LAB — POCT I-STAT EG7
Acid-Base Excess: 1 mmol/L (ref 0.0–2.0)
Acid-base deficit: 3 mmol/L — ABNORMAL HIGH (ref 0.0–2.0)
Bicarbonate: 25.2 mmol/L (ref 20.0–28.0)
Bicarbonate: 28.3 mmol/L — ABNORMAL HIGH (ref 20.0–28.0)
Calcium, Ion: 1.16 mmol/L (ref 1.15–1.40)
Calcium, Ion: 1.27 mmol/L (ref 1.15–1.40)
HCT: 38 % (ref 36.0–46.0)
HCT: 38 % (ref 36.0–46.0)
Hemoglobin: 12.9 g/dL (ref 12.0–15.0)
Hemoglobin: 12.9 g/dL (ref 12.0–15.0)
O2 Saturation: 69 %
O2 Saturation: 71 %
Potassium: 3.7 mmol/L (ref 3.5–5.1)
Potassium: 4.1 mmol/L (ref 3.5–5.1)
Sodium: 128 mmol/L — ABNORMAL LOW (ref 135–145)
Sodium: 139 mmol/L (ref 135–145)
TCO2: 27 mmol/L (ref 22–32)
TCO2: 30 mmol/L (ref 22–32)
pCO2, Ven: 55.3 mmHg (ref 44.0–60.0)
pCO2, Ven: 58.1 mmHg (ref 44.0–60.0)
pH, Ven: 7.245 — ABNORMAL LOW (ref 7.250–7.430)
pH, Ven: 7.316 (ref 7.250–7.430)
pO2, Ven: 41 mmHg (ref 32.0–45.0)
pO2, Ven: 43 mmHg (ref 32.0–45.0)

## 2020-03-28 LAB — POCT I-STAT 7, (LYTES, BLD GAS, ICA,H+H)
Acid-Base Excess: 2 mmol/L (ref 0.0–2.0)
Bicarbonate: 28.9 mmol/L — ABNORMAL HIGH (ref 20.0–28.0)
Calcium, Ion: 1.22 mmol/L (ref 1.15–1.40)
HCT: 36 % (ref 36.0–46.0)
Hemoglobin: 12.2 g/dL (ref 12.0–15.0)
O2 Saturation: 99 %
Potassium: 4 mmol/L (ref 3.5–5.1)
Sodium: 141 mmol/L (ref 135–145)
TCO2: 30 mmol/L (ref 22–32)
pCO2 arterial: 52 mmHg — ABNORMAL HIGH (ref 32.0–48.0)
pH, Arterial: 7.353 (ref 7.350–7.450)
pO2, Arterial: 135 mmHg — ABNORMAL HIGH (ref 83.0–108.0)

## 2020-03-28 LAB — GLUCOSE, CAPILLARY: Glucose-Capillary: 142 mg/dL — ABNORMAL HIGH (ref 70–99)

## 2020-03-28 SURGERY — RIGHT/LEFT HEART CATH AND CORONARY ANGIOGRAPHY
Anesthesia: LOCAL

## 2020-03-28 MED ORDER — ASPIRIN 81 MG PO CHEW
81.0000 mg | CHEWABLE_TABLET | ORAL | Status: AC
  Administered 2020-03-28: 81 mg via ORAL
  Filled 2020-03-28: qty 1

## 2020-03-28 MED ORDER — HEPARIN (PORCINE) IN NACL 1000-0.9 UT/500ML-% IV SOLN
INTRAVENOUS | Status: DC | PRN
  Administered 2020-03-28: 500 mL

## 2020-03-28 MED ORDER — HEPARIN (PORCINE) IN NACL 1000-0.9 UT/500ML-% IV SOLN
INTRAVENOUS | Status: AC
  Filled 2020-03-28: qty 1000

## 2020-03-28 MED ORDER — ACETAMINOPHEN 325 MG PO TABS: 650.0000 mg | ORAL_TABLET | ORAL | Status: DC | PRN

## 2020-03-28 MED ORDER — SODIUM CHLORIDE 0.9 % IV SOLN: 250.0000 mL | INTRAVENOUS | Status: DC | PRN

## 2020-03-28 MED ORDER — HYDRALAZINE HCL 20 MG/ML IJ SOLN: 10.0000 mg | INTRAMUSCULAR | Status: DC | PRN

## 2020-03-28 MED ORDER — SODIUM CHLORIDE 0.9% FLUSH: 3.0000 mL | Freq: Two times a day (BID) | INTRAVENOUS | Status: DC

## 2020-03-28 MED ORDER — MIDAZOLAM HCL 2 MG/2ML IJ SOLN
INTRAMUSCULAR | Status: AC
  Filled 2020-03-28: qty 2

## 2020-03-28 MED ORDER — SODIUM CHLORIDE 0.9% FLUSH: 3.0000 mL | INTRAVENOUS | Status: DC | PRN

## 2020-03-28 MED ORDER — ONDANSETRON HCL 4 MG/2ML IJ SOLN
INTRAMUSCULAR | Status: DC | PRN
  Administered 2020-03-28: 4 mg via INTRAVENOUS

## 2020-03-28 MED ORDER — MIDAZOLAM HCL 2 MG/2ML IJ SOLN
INTRAMUSCULAR | Status: DC | PRN
  Administered 2020-03-28 (×3): 1 mg via INTRAVENOUS

## 2020-03-28 MED ORDER — HEPARIN SODIUM (PORCINE) 1000 UNIT/ML IJ SOLN
INTRAMUSCULAR | Status: AC
  Filled 2020-03-28: qty 1

## 2020-03-28 MED ORDER — VERAPAMIL HCL 2.5 MG/ML IV SOLN
INTRAVENOUS | Status: DC | PRN
  Administered 2020-03-28: 10 mL via INTRA_ARTERIAL

## 2020-03-28 MED ORDER — LABETALOL HCL 5 MG/ML IV SOLN: 10.0000 mg | INTRAVENOUS | Status: DC | PRN

## 2020-03-28 MED ORDER — SODIUM CHLORIDE 0.9 % IV SOLN: INTRAVENOUS | Status: DC

## 2020-03-28 MED ORDER — LIDOCAINE HCL (PF) 1 % IJ SOLN
INTRAMUSCULAR | Status: DC | PRN
  Administered 2020-03-28: 2 mL
  Administered 2020-03-28: 1 mL

## 2020-03-28 MED ORDER — IOHEXOL 350 MG/ML SOLN
INTRAVENOUS | Status: DC | PRN
  Administered 2020-03-28: 60 mL

## 2020-03-28 MED ORDER — LIDOCAINE HCL (PF) 1 % IJ SOLN
INTRAMUSCULAR | Status: AC
  Filled 2020-03-28: qty 30

## 2020-03-28 MED ORDER — SODIUM CHLORIDE 0.9 % IV SOLN: INTRAVENOUS | Status: AC

## 2020-03-28 MED ORDER — FENTANYL CITRATE (PF) 100 MCG/2ML IJ SOLN
INTRAMUSCULAR | Status: DC | PRN
  Administered 2020-03-28 (×3): 25 ug via INTRAVENOUS

## 2020-03-28 MED ORDER — ONDANSETRON HCL 4 MG/2ML IJ SOLN
INTRAMUSCULAR | Status: AC
  Filled 2020-03-28: qty 2

## 2020-03-28 MED ORDER — VERAPAMIL HCL 2.5 MG/ML IV SOLN
INTRAVENOUS | Status: AC
  Filled 2020-03-28: qty 2

## 2020-03-28 MED ORDER — HEPARIN SODIUM (PORCINE) 1000 UNIT/ML IJ SOLN
INTRAMUSCULAR | Status: DC | PRN
  Administered 2020-03-28: 6000 [IU] via INTRAVENOUS

## 2020-03-28 MED ORDER — ONDANSETRON HCL 4 MG/2ML IJ SOLN: 4.0000 mg | Freq: Four times a day (QID) | INTRAMUSCULAR | Status: DC | PRN

## 2020-03-28 MED ORDER — FENTANYL CITRATE (PF) 100 MCG/2ML IJ SOLN
INTRAMUSCULAR | Status: AC
  Filled 2020-03-28: qty 2

## 2020-03-28 SURGICAL SUPPLY — 16 items
CATH 5FR JL3.5 JR4 ANG PIG MP (CATHETERS) ×2 IMPLANT
CATH BALLN WEDGE 5F 110CM (CATHETERS) ×2 IMPLANT
CATH INFINITI 5 FR 3DRC (CATHETERS) ×2 IMPLANT
CATH INFINITI 5 FR AR1 MOD (CATHETERS) ×2 IMPLANT
CATH INFINITI 5FR AL1 (CATHETERS) ×2 IMPLANT
CATH INFINITI 5FR JL4 (CATHETERS) ×2 IMPLANT
DEVICE RAD COMP TR BAND LRG (VASCULAR PRODUCTS) ×2 IMPLANT
GLIDESHEATH SLEND SS 6F .021 (SHEATH) ×2 IMPLANT
GUIDEWIRE .025 260CM (WIRE) ×2 IMPLANT
GUIDEWIRE INQWIRE 1.5J.035X260 (WIRE) ×1 IMPLANT
INQWIRE 1.5J .035X260CM (WIRE) ×2
PACK CARDIAC CATHETERIZATION (CUSTOM PROCEDURE TRAY) ×2 IMPLANT
SHEATH GLIDE SLENDER 4/5FR (SHEATH) ×2 IMPLANT
TRANSDUCER W/STOPCOCK (MISCELLANEOUS) ×2 IMPLANT
TUBING ART PRESS 72  MALE/FEM (TUBING) ×2
TUBING ART PRESS 72 MALE/FEM (TUBING) ×1 IMPLANT

## 2020-03-28 NOTE — Discharge Instructions (Signed)
Radial Site Care  This sheet gives you information about how to care for yourself after your procedure. Your health care provider may also give you more specific instructions. If you have problems or questions, contact your health care provider. What can I expect after the procedure? After the procedure, it is common to have:  Bruising and tenderness at the catheter insertion area. Follow these instructions at home: Medicines  Take over-the-counter and prescription medicines only as told by your health care provider. Insertion site care  Follow instructions from your health care provider about how to take care of your insertion site. Make sure you: ? Wash your hands with soap and water before you change your bandage (dressing). If soap and water are not available, use hand sanitizer. ? Change your dressing as told by your health care provider. ? Leave stitches (sutures), skin glue, or adhesive strips in place. These skin closures may need to stay in place for 2 weeks or longer. If adhesive strip edges start to loosen and curl up, you may trim the loose edges. Do not remove adhesive strips completely unless your health care provider tells you to do that.  Check your insertion site every day for signs of infection. Check for: ? Redness, swelling, or pain. ? Fluid or blood. ? Pus or a bad smell. ? Warmth.  Do not take baths, swim, or use a hot tub until your health care provider approves.  You may shower 24-48 hours after the procedure, or as directed by your health care provider. ? Remove the dressing and gently wash the site with plain soap and water. ? Pat the area dry with a clean towel. ? Do not rub the site. That could cause bleeding.  Do not apply powder or lotion to the site. Activity   For 24 hours after the procedure, or as directed by your health care provider: ? Do not flex or bend the affected arm. ? Do not push or pull heavy objects with the affected arm. ? Do not  drive yourself home from the hospital or clinic. You may drive 24 hours after the procedure unless your health care provider tells you not to. ? Do not operate machinery or power tools.  Do not lift anything that is heavier than 10 lb (4.5 kg), or the limit that you are told, until your health care provider says that it is safe.  Ask your health care provider when it is okay to: ? Return to work or school. ? Resume usual physical activities or sports. ? Resume sexual activity. General instructions  If the catheter site starts to bleed, raise your arm and put firm pressure on the site. If the bleeding does not stop, get help right away. This is a medical emergency.  If you went home on the same day as your procedure, a responsible adult should be with you for the first 24 hours after you arrive home.  Keep all follow-up visits as told by your health care provider. This is important. Contact a health care provider if:  You have a fever.  You have redness, swelling, or yellow drainage around your insertion site. Get help right away if:  You have unusual pain at the radial site.  The catheter insertion area swells very fast.  The insertion area is bleeding, and the bleeding does not stop when you hold steady pressure on the area.  Your arm or hand becomes pale, cool, tingly, or numb. These symptoms may represent a serious problem   that is an emergency. Do not wait to see if the symptoms will go away. Get medical help right away. Call your local emergency services (911 in the [place] Do not drive yourself to the hospital. Summary  After the procedure, it is common to have bruising and tenderness at the site.  Follow instructions from your health care provider about how to take care of your radial site wound. Check the wound every day for signs of infection.  Do not lift anything that is heavier than 10 lb (4.5 kg), or the limit that you are told, until your health care provider says  that it is safe. This information is not intended to replace advice given to you by your health care provider. Make sure you discuss any questions you have with your health care provider. Document Revised: 01/22/2018 Document Reviewed: 01/22/2018 Elsevier Patient Education  2020 Elsevier Inc[employer]  

## 2020-03-28 NOTE — Patient Outreach (Signed)
[place] [facility]) Care Management  03/28/2020  Sara Tran 13-Oct-1956 [id number]  Review of patient's chart showed that she had been admitted at the time of my call. Patient will be called back in 5-7 business days.  Elayne Guerin, PharmD, [place] Clinical Pharmacist [phone]

## 2020-03-28 NOTE — Interval H&P Note (Signed)
History and Physical Interval Note:  03/28/2020 9:17 AM  Sara Tran  has presented today for surgery, with the diagnosis of heart failure, PAH and chest pressure.  The various methods of treatment have been discussed with the patient and family. After consideration of risks, benefits and other options for treatment, the patient has consented to  Procedure(s): RIGHT/LEFT HEART CATH AND CORONARY ANGIOGRAPHY (N/A) and possible coronary angioplasty as a surgical intervention.  The patient's history has been reviewed, patient examined, no change in status, stable for surgery.  I have reviewed the patient's chart and labs.  Questions were answered to the patient's satisfaction.     Braylie Badami

## 2020-03-29 ENCOUNTER — Encounter: Payer: Self-pay | Admitting: Cardiology

## 2020-03-29 ENCOUNTER — Ambulatory Visit: Payer: Self-pay | Admitting: Pharmacist

## 2020-04-05 ENCOUNTER — Ambulatory Visit: Payer: Self-pay | Admitting: Pharmacist

## 2020-04-05 ENCOUNTER — Other Ambulatory Visit: Payer: Self-pay | Admitting: Pharmacist

## 2020-04-05 NOTE — Patient Outreach (Signed)
[place] [facility]) Care Management  04/05/2020  Sara Tran 03/06/1956 [id number]   Patient was contacted regarding medication assistance. Unfortunately, she did not answer her phone. HIPAA compliant message was left on her voicemail.  Patient has been called multiple times unsuccessfully.   Plan:  Patient's case is being closed as the [place] Team has transitioned to the Quality Department and will no longer document in [facility]. A HIPAA compliant message was left on the patient's voicemail, she can reach back out for med assistance or medication questions in the future if necessary.   Elayne Guerin, PharmD, [place] Clinical Pharmacist [phone]

## 2020-04-11 ENCOUNTER — Other Ambulatory Visit: Payer: Self-pay

## 2020-04-11 NOTE — Patient Outreach (Signed)
[place] [facility]) Care Management  04/11/2020  NONI STONESIFER 08/02/1956 [id number]   Telephone Assessment    Outreach attempt to patient. No answer at present and HIPAA compliant voicemail left.      Plan: RN CM will make outreach attempt to patient within the month of July if no return call from patient.    Enzo Montgomery, RN,BSN,CCM [place] Management Telephonic Care Management Coordinator Direct Phone: [phone] Toll Free: [phone] Fax: [phone]

## 2020-04-12 ENCOUNTER — Other Ambulatory Visit: Payer: Self-pay

## 2020-04-12 NOTE — Patient Outreach (Signed)
[place] [facility]) Care Management  04/12/2020  Sara Tran July 13, 1956 [id number]   Telephone Assessment    Voicemail message received from patient. Return call placed to patient. Spoke with patient and discussed that [facility] has been trying for several months to reach her. She states that she has been doing well-denies any acute issues. She states that her diabetes has been improving. Last A1C on file 5.5(Dec 2020). She reports that she was recently taken off of Trulicity for a "tiral run". Patient reports that her blood sugars have been ranging in the 150s. Appetite remains good. She is adhering to diabetic diet. She denies any recent falls. Patient reports she needs assistance affording Epi-Pens. She voices she primarily uses them for bee stings and has had a few episodes this year already. She is agreeable to [facility] services to see if any assistance available. She denies any other RN CM needs or concerns at this time.    Medications Reviewed Today    Reviewed by Ala Dach, RN (Registered Nurse) on 03/28/20 at [facility] Status: Complete  Medication Order Taking? Sig Documenting Provider Last Dose Status Informant  albuterol (PROVENTIL HFA;VENTOLIN HFA) 108 (90 BASE) MCG/ACT inhaler [id number] Yes Inhale 2 puffs into the lungs every 6 (six) hours as needed for wheezing or shortness of breath.  [provider] Past Week Unknown time Active Self  albuterol (PROVENTIL) (2.5 MG/3ML) 0.083% nebulizer solution [id number] No Take 2.5 mg by nebulization every 4 (four) hours as needed for wheezing or shortness of breath.  [provider] More than a month Unknown time Active Self           Med Note (JOBE, JESSICA R   Tue Jul 23, 2017  2:17 PM)    ALPRAZolam Duanne Moron) 1 MG tablet [id number] Yes Take 0.5 mg by mouth daily as needed for anxiety.  [provider] Past Week Unknown time Active Self  amLODipine (NORVASC) 10 MG tablet [id number] Yes Take 10 mg by mouth  every evening.  [provider] 03/27/2020 Unknown time Active Self  Ascorbic Acid (VITAMIN C) 1000 MG tablet [id number] Yes Take 1,000 mg by mouth daily. [provider] 03/28/2020 Unknown time Active Self  baclofen (LIORESAL) 10 MG tablet [id number] Yes Take 1 tablet (10 mg total) by mouth 3 (three) times daily as needed for muscle spasms. Meredith Staggers, MD 03/27/2020 Unknown time Active Self  clobetasol (TEMOVATE) 0.05 % external solution [id number] Yes Apply 1 application topically daily as needed (scalp irritation).  [provider] 03/28/2020 Unknown time Active Self  EPINEPHrine 0.3 mg/0.3 mL IJ SOAJ injection [id number] Yes Inject 0.3 mg into the muscle as needed for anaphylaxis.  [provider]  Active Self           Med Note Delrae Alfred Mar 28, 2020  8:08 AM) Never used as of 03/28/20.  furosemide (LASIX) 40 MG tablet [id number] Yes Take 40 mg by mouth daily.  [provider] 03/27/2020 Unknown time Active Self  gabapentin (NEURONTIN) 600 MG tablet [id number] Yes Take 600 mg by mouth 3 (three) times daily.  [provider] 03/27/2020 Unknown time Active Self  isosorbide mononitrate (IMDUR) 30 MG 24 hr tablet [id number] Yes Take 30 mg by mouth daily.  [provider] 03/28/2020 0430 Active Self  loratadine (CLARITIN) 10 MG tablet [id number] Yes Take 10 mg by mouth daily.  [provider] 03/28/2020 Unknown time Active Self  magnesium oxide (MAG-OX) 400 MG  tablet [id number] Yes Take 400 mg by mouth 2 (two) times daily.  [provider] 03/28/2020 Unknown time Active Self  nitroGLYCERIN (NITROSTAT) 0.4 MG SL tablet [id number] Yes Place 0.4 mg under the tongue every 5 (five) minutes as needed for chest pain.  [provider] Past Month Unknown time Active Self  ondansetron (ZOFRAN) 4 MG tablet [id number] No Take 4 mg by mouth every 8 (eight) hours as needed for nausea or vomiting.  [provider] More  than a month Unknown time Active Self  oxyCODONE-acetaminophen (PERCOCET) 10-325 MG tablet [id number] Yes Take 1 tablet by mouth 4 (four) times daily as needed for pain.  [provider] 03/28/2020 0430 Active Self  potassium chloride SA (K-DUR) 20 MEQ tablet [id number] Yes Take 20 mEq by mouth daily.  [provider] 03/28/2020 Unknown time Active Self  senna-docusate (SENOKOT-S) 8.6-50 MG tablet [id number] No Take 1 tablet by mouth daily. [provider] More than a month Unknown time Active Self  [facility] JOH[facility] WORT PO [id number] Yes Take 160 mg by mouth daily. [provider] Past Month Unknown time Active Self  SYMBICORT 160-4.5 MCG/ACT inhaler [id number] Yes Take 2 puffs by mouth 2 (two) times daily. [provider] 03/28/2020 Unknown time Active Self  TRULICITY 1.5 ER/1.5QM SOPN [id number] Yes Inject 1.5 mg into the skin every 7 (seven) days.  [provider] Past Week Unknown time Active Self           Med Note [facility] [place], [name]   Wed Mar 16, 2020  2:10 PM)           [facility] CM Care Plan Problem One     Most Recent Value  Care Plan Problem One  Knowledge deficit related to disease process and mgmt of Diabetes.  Role Documenting the Problem One  Care Management Telephonic Coordinator  Care Plan for Problem One  Active  [facility] Long Term Goal   Patient will maintain and report a A1C level of 6.0 or less over the next 90 days.  [facility] Long Term Goal Start Date  11/30/19  Interventions for Problem One Long Term Goal  RN CM assessed for cbg monitoring and values. RN CM confirmed med adherence. RN CM reviewed most recent A1C level    [facility] CM Care Plan Problem Two     Most Recent Value  Care Plan Problem Two  Patient with hand injury and decreased range of motion  Role Documenting the Problem Two  Care Management Telephonic Coordinator  Care Plan for Problem Two  Active  [facility] Long Term Goal  Patient will report complete healing of hand injury oevr the next 90  days.  [facility] Long Term Goal Start Date  11/30/19  [facility] Long Term Goal Met Date  04/12/20  [facility] CM Short Term Goal #1   Patient will complete MD follow up appt for hand eval within the next 30 days.  [facility] CM Short Term Goal #1 Start Date  11/30/19  [facility] CM Short Term Goal #1 Met Date   04/12/20     Plan: RN CM will send [place] referral for possible med assistance. RN CM discussed with patient next outreach within the month of July. Patient gave verbal consent and in agreement with RN CM follow up and timeframe. Patient aware that they may contact RN CM sooner for any issues or concerns. RN CM will send quarterly update to PCP.  Enzo Montgomery, RN,BSN,CCM [place] Management Telephonic Care Management Coordinator Direct Phone: [phone]  Toll Free: [phone] Fax: [phone]

## 2020-04-13 ENCOUNTER — Ambulatory Visit: Payer: Self-pay

## 2020-04-13 DIAGNOSIS — L820 Inflamed seborrheic keratosis: Secondary | ICD-10-CM | POA: Diagnosis not present

## 2020-04-13 DIAGNOSIS — L218 Other seborrheic dermatitis: Secondary | ICD-10-CM | POA: Diagnosis not present

## 2020-04-18 ENCOUNTER — Other Ambulatory Visit (HOSPITAL_COMMUNITY)
Admission: RE | Admit: 2020-04-18 | Discharge: 2020-04-18 | Disposition: A | Discharge status: Home / Self Care | Payer: Medicare HMO | Source: Ambulatory Visit | Attending: Adult Health | Admitting: Adult Health

## 2020-04-18 ENCOUNTER — Ambulatory Visit (INDEPENDENT_AMBULATORY_CARE_PROVIDER_SITE_OTHER): Payer: Medicare HMO | Admitting: Adult Health

## 2020-04-18 ENCOUNTER — Encounter: Payer: Self-pay | Admitting: Adult Health

## 2020-04-18 ENCOUNTER — Other Ambulatory Visit: Payer: Self-pay

## 2020-04-18 VITALS — BP 157/82 | HR 92 | Ht 60.0 in | Wt 293.0 lb

## 2020-04-18 DIAGNOSIS — Z01419 Encounter for gynecological examination (general) (routine) without abnormal findings: Secondary | ICD-10-CM

## 2020-04-18 DIAGNOSIS — Z1211 Encounter for screening for malignant neoplasm of colon: Secondary | ICD-10-CM | POA: Diagnosis not present

## 2020-04-18 DIAGNOSIS — Z780 Asymptomatic menopausal state: Secondary | ICD-10-CM | POA: Insufficient documentation

## 2020-04-18 DIAGNOSIS — Z1212 Encounter for screening for malignant neoplasm of rectum: Secondary | ICD-10-CM | POA: Diagnosis not present

## 2020-04-18 DIAGNOSIS — R8290 Unspecified abnormal findings in urine: Secondary | ICD-10-CM | POA: Diagnosis not present

## 2020-04-18 DIAGNOSIS — Z1151 Encounter for screening for human papillomavirus (HPV): Secondary | ICD-10-CM | POA: Diagnosis not present

## 2020-04-18 DIAGNOSIS — R809 Proteinuria, unspecified: Secondary | ICD-10-CM

## 2020-04-18 DIAGNOSIS — R102 Pelvic and perineal pain: Secondary | ICD-10-CM

## 2020-04-18 DIAGNOSIS — Z124 Encounter for screening for malignant neoplasm of cervix: Secondary | ICD-10-CM | POA: Diagnosis not present

## 2020-04-18 DIAGNOSIS — Z1272 Encounter for screening for malignant neoplasm of vagina: Secondary | ICD-10-CM | POA: Diagnosis not present

## 2020-04-18 DIAGNOSIS — G8929 Other chronic pain: Secondary | ICD-10-CM

## 2020-04-18 LAB — HEMOCCULT GUIAC POC 1CARD (OFFICE): Fecal Occult Blood, POC: NEGATIVE

## 2020-04-18 LAB — POCT URINALYSIS DIPSTICK
Blood, UA: NEGATIVE
Glucose, UA: NEGATIVE
Ketones, UA: NEGATIVE
Leukocytes, UA: NEGATIVE
Nitrite, UA: NEGATIVE
Protein, UA: POSITIVE — AB

## 2020-04-18 NOTE — Progress Notes (Signed)
Patient ID: Sara Tran, female   DOB: [birth year], 64 y.o.   MRN: [id number] History of Present Illness: Sara Tran is a 64 year old black female,single,PM in for a well woman gyn exam and pap. PCP is Dr Hilma Favors.    Current Medications, Allergies, Past Medical History, Past Surgical History, Family History and Social History were reviewed in [employer] record.     Review of Systems: Patient denies any unusual  Headaches,(but odes have, ?allergies), hearing loss, fatigue, blurred vision, shortness of breath, chest pain(has at times, just had cardiac cath), has chronic pelvic pain, >4 years, has know fibroid, problems with bowel movements(constipated at times, on meds), urination(urine as bad odor), or intercourse(not active). 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 No mood swings. Has had kidney stones  Has back doctor     Physical Exam:BP (!) 157/82 (BP Location: Right Arm, Patient Position: Sitting, Cuff Size: Large)   Pulse 92   Ht 5' (1.524 m)   Wt 293 lb (132.9 kg)   LMP 06/06/2013   BMI 57.22 kg/m  Urine dipstick 4+protein General:  Well developed, well nourished, no acute distress Skin:  Warm and dry Neck:  Midline trachea, normal thyroid, good ROM, no lymphadenopathy,no carotid bruits heard  Lungs; Clear to auscultation bilaterally Breast:  No dominant palpable mass, retraction, or nipple discharge,large  Cardiovascular: Regular rate and rhythm Abdomen:  Soft, non tender, no hepatosplenomegaly,obese Pelvic:  External genitalia is normal in appearance, no lesions.  The vagina is normal in appearance for age with loss of color, moisture and rugae. Urethra has no lesions or masses. The cervix is smooth with stenotic os, pap with high risk HPV 16/18 genotyping performed.   Uterus is not felt due to abdominal girth, but she is tender in this area.  No adnexal masses felt, +tenderness noted.Bladder is non tender, no masses felt. Rectal: Good  sphincter tone, no polyps, or hemorrhoids felt.  Hemoccult negative. Extremities/musculoskeletal:  No swelling or varicosities noted, no clubbing or cyanosis Psych:  No mood changes, alert and cooperative,seems happy AA is 0 Fall risk is moderate PHQ 9 score is 6, no SI, is on meds  Examination chaperoned by Levy Pupa LPN, we assisted pt off the exam table.  Impression and Plan:  1. Routine cervical smear Pap sent  2. Encounter for gynecological examination with Papanicolaou smear of cervix Pap sent Pap in 3 years if normal Physical with PCP Labs with PCP Mammogram yearly,gets in [place]  3. Encounter for colorectal cancer screening  4. Chronic female pelvic pain Could be back related   5. Bad odor of urine Will get UA C&S   6. Proteinuria, unspecified type Will get UA C&S

## 2020-04-18 NOTE — Addendum Note (Signed)
Addended by: Linton Rump on: 04/18/2020 04:05 PM   Modules accepted: Orders

## 2020-04-19 LAB — MICROSCOPIC EXAMINATION
Bacteria, UA: NONE SEEN
Casts: NONE SEEN /lpf
Epithelial Cells (non renal): >10 /hpf — AB (ref 0–10)

## 2020-04-19 LAB — URINALYSIS, ROUTINE W REFLEX MICROSCOPIC
Bilirubin, UA: NEGATIVE
Glucose, UA: NEGATIVE
Leukocytes,UA: NEGATIVE
Nitrite, UA: NEGATIVE
RBC, UA: NEGATIVE
Specific Gravity, UA: 1.028 (ref 1.005–1.030)
Urobilinogen, Ur: 0.2 mg/dL (ref 0.2–1.0)
pH, UA: 5 (ref 5.0–7.5)

## 2020-04-20 ENCOUNTER — Ambulatory Visit
Admission: RE | Admit: 2020-04-20 | Discharge: 2020-04-20 | Disposition: A | Discharge status: Home / Self Care | Payer: Medicare HMO | Source: Ambulatory Visit | Attending: Physical Medicine & Rehabilitation | Admitting: Physical Medicine & Rehabilitation

## 2020-04-20 ENCOUNTER — Encounter: Payer: Medicare HMO | Attending: Physical Medicine & Rehabilitation | Admitting: Physical Medicine & Rehabilitation

## 2020-04-20 ENCOUNTER — Other Ambulatory Visit: Payer: Self-pay

## 2020-04-20 ENCOUNTER — Encounter: Payer: Self-pay | Admitting: Physical Medicine & Rehabilitation

## 2020-04-20 VITALS — BP 159/88 | HR 83 | Temp 98.9°F | Ht 60.0 in | Wt 292.0 lb

## 2020-04-20 DIAGNOSIS — K581 Irritable bowel syndrome with constipation: Secondary | ICD-10-CM

## 2020-04-20 DIAGNOSIS — G894 Chronic pain syndrome: Secondary | ICD-10-CM

## 2020-04-20 DIAGNOSIS — M47816 Spondylosis without myelopathy or radiculopathy, lumbar region: Secondary | ICD-10-CM | POA: Diagnosis not present

## 2020-04-20 DIAGNOSIS — M797 Fibromyalgia: Secondary | ICD-10-CM | POA: Diagnosis not present

## 2020-04-20 DIAGNOSIS — R109 Unspecified abdominal pain: Secondary | ICD-10-CM | POA: Diagnosis not present

## 2020-04-20 DIAGNOSIS — M5416 Radiculopathy, lumbar region: Secondary | ICD-10-CM

## 2020-04-20 DIAGNOSIS — R140 Abdominal distension (gaseous): Secondary | ICD-10-CM | POA: Diagnosis not present

## 2020-04-20 DIAGNOSIS — M545 Low back pain: Secondary | ICD-10-CM | POA: Diagnosis not present

## 2020-04-20 MED ORDER — NORTRIPTYLINE HCL 10 MG PO CAPS: 10.0000 mg | ORAL_CAPSULE | Freq: Every day | ORAL | 2 refills | Status: DC
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
## 2020-04-20 NOTE — Patient Instructions (Signed)
WORK ON YOUR POSTURE AND STRETCHING DAILY

## 2020-04-21 ENCOUNTER — Telehealth: Payer: Self-pay | Admitting: *Deleted

## 2020-04-21 LAB — CYTOLOGY - PAP
Adequacy: ABSENT
Comment: NEGATIVE
Diagnosis: NEGATIVE
High risk HPV: NEGATIVE

## 2020-04-21 NOTE — Telephone Encounter (Signed)
Sara Tran called and is requesting who you would recommend for acupuncture treatment.  Please advise.

## 2020-04-22 NOTE — Telephone Encounter (Signed)
I would recommend that she check out integrative therapies off of [address] on [address]

## 2020-04-22 NOTE — Telephone Encounter (Signed)
Left message for patient to call back  

## 2020-04-25 NOTE — Telephone Encounter (Signed)
Patient notified

## 2020-04-29 DIAGNOSIS — M1991 Primary osteoarthritis, unspecified site: Secondary | ICD-10-CM | POA: Diagnosis not present

## 2020-04-29 DIAGNOSIS — I10 Essential (primary) hypertension: Secondary | ICD-10-CM | POA: Diagnosis not present

## 2020-04-29 DIAGNOSIS — E119 Type 2 diabetes mellitus without complications: Secondary | ICD-10-CM | POA: Diagnosis not present

## 2020-05-03 ENCOUNTER — Ambulatory Visit (HOSPITAL_COMMUNITY): Payer: Medicare HMO | Attending: Physical Medicine & Rehabilitation | Admitting: Physical Therapy

## 2020-05-03 ENCOUNTER — Other Ambulatory Visit: Payer: Self-pay

## 2020-05-03 ENCOUNTER — Encounter (HOSPITAL_COMMUNITY): Payer: Self-pay | Admitting: Physical Therapy

## 2020-05-03 DIAGNOSIS — R29898 Other symptoms and signs involving the musculoskeletal system: Secondary | ICD-10-CM | POA: Insufficient documentation

## 2020-05-03 DIAGNOSIS — M6281 Muscle weakness (generalized): Secondary | ICD-10-CM

## 2020-05-03 DIAGNOSIS — R2689 Other abnormalities of gait and mobility: Secondary | ICD-10-CM | POA: Insufficient documentation

## 2020-05-03 DIAGNOSIS — M545 Low back pain, unspecified: Secondary | ICD-10-CM
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
## 2020-05-04 ENCOUNTER — Other Ambulatory Visit: Payer: Self-pay

## 2020-05-04 ENCOUNTER — Ambulatory Visit (HOSPITAL_COMMUNITY): Payer: Medicare HMO | Admitting: Physical Therapy

## 2020-05-04 ENCOUNTER — Encounter (HOSPITAL_COMMUNITY): Payer: Self-pay | Admitting: Physical Therapy

## 2020-05-04 DIAGNOSIS — M6281 Muscle weakness (generalized): Secondary | ICD-10-CM | POA: Diagnosis not present

## 2020-05-04 DIAGNOSIS — M545 Low back pain, unspecified: Secondary | ICD-10-CM

## 2020-05-04 DIAGNOSIS — R2689 Other abnormalities of gait and mobility: Secondary | ICD-10-CM | POA: Diagnosis not present

## 2020-05-04 DIAGNOSIS — R29898 Other symptoms and signs involving the musculoskeletal system: Secondary | ICD-10-CM | POA: Diagnosis not present

## 2020-05-04 DIAGNOSIS — G894 Chronic pain syndrome: Secondary | ICD-10-CM | POA: Diagnosis not present

## 2020-05-04 NOTE — Therapy (Signed)
[place], [place], [address] Phone: [phone]   Fax:  [phone]  Physical Therapy Treatment  Patient Details  Name: Sara Tran MRN: [id number] Date of Birth: Jan 06, 1956 Referring Provider (PT): Alger Simons MD   Encounter Date: 05/04/2020  PT End of Session - 05/04/20 0952    Visit Number  2    Number of Visits  8    Date for PT Re-Evaluation  05/31/20    Authorization Type  [employer]    Authorization Time Period  05/01/20-05/31/20    Authorization - Visit Number  2    Authorization - Number of Visits  8    PT Start Time  0946    PT Stop Time  1025    PT Time Calculation (min)  39 min    Activity Tolerance  Patient tolerated treatment well;Patient limited by fatigue    Behavior During Therapy  [facility] for tasks assessed/performed       Past Medical History:  Diagnosis Date  . Adnexal cyst    Right simple cyst seen on [place]  . Anginal pain ([place])   . Anxiety   . Arthritis   . Asthma   . Atrial fibrillation (HCC)    hx of   . CHF (congestive heart failure) ([place])   . Chronic back pain   . Chronic hip pain   . COPD (chronic obstructive pulmonary disease) ([place])   . Coronary artery disease    stents placed   . Dyspnea   . GERD (gastroesophageal reflux disease)   . Headache    due to pinched nerve damaage   . Heart murmur    hx of slight murmur   . History of bronchitis   . History of cardiac catheterization    Normal coronaries 2013  . History of kidney stones   . Hot flashes   . Hypertension   . Hypertriglyceridemia   . IBS (irritable bowel syndrome)   . Internal hemorrhoids   . Morbid obesity ([place])    s/p lap band surgery  . Myocardial infarction ([place])    times 3  . Obesity   . Pulmonary HTN ([place])    [place] 2008:  RA pressures 13/10 with a mean of 7 mmHg.  RV pressure 69/62 with end diastolic pressure of 15 mmHg.  PA pressure 49/23 with a mean of 35 mmHg.  Pulmonary capillary wedge 17/50  with a mean of 14 mmHg. The PA saturation was 68%.  RA saturation was 71% and aortic saturation was 90%.  Cardiac output was 6.0 with a cardiac index of 2.80 by Fick.  Normal coronaries by cath 2008.  [place] Kitchen Renal insufficiency   . Sciatica of right side   . Sleep apnea    CPAP use does not know settings   . Trauma   . Type 2 diabetes mellitus ([place])    type II   . Uterine fibroid     Past Surgical History:  Procedure Laterality Date  . APPENDECTOMY    . CARDIAC CATHETERIZATION  07/2007, 05/2012   Normal coronary arteries  . COLONOSCOPY  10/18/10   SLF:2-mm sessile sigmoid polyp/diverticula  . CYSTOSCOPY/URETEROSCOPY/HOLMIUM LASER/STENT PLACEMENT Right 08/01/2017   Procedure: CYSTOSCOPY/ RIGHT URETEROSCOPY/ RIGHT RETROGRADE/ HOLMIUM LASER  AND RIGHT STENT PLACEMENT FIRST STAGE;  Surgeon: Irine Seal, MD;  Location: [facility];  Service: Urology;  Laterality: Right;  . CYSTOSCOPY/URETEROSCOPY/HOLMIUM LASER/STENT PLACEMENT Right 08/08/2017   Procedure: RIGHT URETEROSCOPY WITH HOLMIUM LASER RIGHT  STENT PLACEMENT;  Surgeon: Irine Seal, MD;  Location: [facility];  Service: Urology;  Laterality: Right;  . EXTRACORPOREAL SHOCK WAVE LITHOTRIPSY Right 08/22/2017   Procedure: RIGHT EXTRACORPOREAL SHOCK WAVE LITHOTRIPSY (ESWL);  Surgeon: Alexis Frock, MD;  Location: [facility];  Service: Urology;  Laterality: Right;  . HEMORRHOID BANDING  07/2012   Dr. Oneida Alar  . LAPAROSCOPIC CHOLECYSTECTOMY  1985  . LAPAROSCOPIC GASTRIC BANDING  12/12/2010  . LEFT AND RIGHT HEART CATHETERIZATION WITH CORONARY ANGIOGRAM N/A 06/03/2012   Procedure: LEFT AND RIGHT HEART CATHETERIZATION WITH CORONARY ANGIOGRAM;  Surgeon: Jolaine Artist, MD;  Location: [facility] C[facility] LAB;  Service: Cardiovascular;  Laterality: N/A;  . PILONIDAL CYST EXCISION  1974  . RIGHT/LEFT HEART CATH AND CORONARY ANGIOGRAPHY N/A 03/28/2020   Procedure: RIGHT/LEFT HEART CATH AND CORONARY ANGIOGRAPHY;  Surgeon: Jolaine Artist, MD;  Location: [facility];   Service: Cardiovascular;  Laterality: N/A;  . 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 TUMOR EXCISION  10/2011   Face    There were no vitals filed for this visit.  Subjective Assessment - 05/04/20 0951    Subjective  Patient says her back is hurting and spasming today. Says she had some pain and trouble putting her shoes on this morning.    Pertinent History  chronic pain, lumbar radic bilateral, chronic low back pain, Fibromyalgia, obesity, CHF    Limitations  Sitting;Lifting;Standing;Walking;House hold activities    How long can you stand comfortably?  30 seconds    How long can you walk comfortably?  1/8th mile    Patient Stated Goals  walk better    Currently in Pain?  Yes    Pain Score  7     Pain Location  Back    Pain Orientation  Right    Pain Descriptors / Indicators  Sharp;Shooting    Pain Type  Chronic pain                       [facility] Adult PT Treatment/Exercise - 05/04/20 0001      Exercises   Exercises  Lumbar      Lumbar Exercises: Stretches   Lower Trunk Rotation  5 reps;10 seconds      Lumbar Exercises: Supine   Ab Set  10 reps;5 seconds    Pelvic Tilt  10 reps;5 seconds    Glut Set  10 reps;5 seconds    Bent Knee Raise  20 reps    Bridge  10 reps;5 seconds             PT Education - 05/04/20 1022    Education Details  On exercise technique and HEP    Person(s) Educated  Patient    Methods  Explanation;Handout    Comprehension  Verbalized understanding       PT Short Term Goals - 05/03/20 1616      PT SHORT TERM GOAL #1   Title  Patient will be independent with HEP in order to improve functional outcomes.    Time  2    Period  Weeks    Status  New    Target Date  05/17/20      PT SHORT TERM GOAL #2   Title  Patient will report at least 25% improvement in symptoms for improved quality of life.    Time  2    Period  Weeks    Status  New    Target Date  05/17/20  PT SHORT TERM GOAL #3   Title  Pt to be able to  complete 10 mintues of light housework with her back pain not increasing past a 7/10.    Time  2    Period  Weeks    Status  New    Target Date  05/17/20        PT Long Term Goals - 05/03/20 1617      PT LONG TERM GOAL #1   Title  Patient will report at least 75% improvement in symptoms for improved quality of life.    Time  4    Period  Weeks    Status  New    Target Date  05/31/20      PT LONG TERM GOAL #2   Title  Patient will demonstrate at least 25% improvement in lumbar ROM in all planes for improved ability to move while at home.    Time  4    Period  Weeks    Status  New    Target Date  05/31/20      PT LONG TERM GOAL #3   Title  Patient will be able to ambulate at least 1/4 mile without back pain greater than 8/10 for improved community ambulation.    Time  4    Period  Weeks    Status  New    Target Date  05/31/20            Plan - 05/04/20 1022    Clinical Impression Statement  Reviewed goals and issued HEP today. Activity graded per patient tolerance. Patient educated on proper form and function of all added exercise. Patient limited by fatigue with table strengthening for core and hip muscles. Patient noted intermittent cramping sensation in both LEs which subsided with rest. Patient with noted fatigue in LLE during ab marching, patient required several breaks for rest during today's session due to increased muscle fatigue. Patient issued HEP handout. Patient will continue to benefit form skilled therapy services to address ongoing weakness, decreased activity tolerance and lumbar/ hip pain.    Personal Factors and Comorbidities  Comorbidity 3+;Education;Past/Current Experience;Time since onset of injury/illness/exacerbation    Comorbidities  chronic pain, lumbar radic bilateral, chronic low back pain, Fibromyalgia, obesity, CHF, diabetes    Examination-Activity Limitations  Bed Mobility;Bend;Carry;Locomotion Level;Sit;Squat;Stairs;Stand;Transfers     Examination-Participation Restrictions  Cleaning;Shop;Volunteer;Yard Work;Meal Prep    Stability/Clinical Decision Making  Evolving/Moderate complexity    Rehab Potential  Fair    PT Frequency  2x / week    PT Duration  4 weeks    PT Treatment/Interventions  ADLs/Self Care Home Management;Aquatic Therapy;Biofeedback;Electrical Stimulation;Iontophoresis 4mg /ml Dexamethasone;Moist Heat;Traction;Ultrasound;Parrafin;Fluidtherapy;Cryotherapy;Contrast Bath;DME Instruction;Gait training;Neuromuscular re-education;Stair training;Functional mobility training;Therapeutic activities;Therapeutic exercise;Balance training;Patient/family education;Orthotic Fit/Training;Manual techniques;Manual lymph drainage;Compression bandaging;Scar mobilization;Passive range of motion;Dry needling;Energy conservation;Splinting;Taping;Vasopneumatic Device;Joint Manipulations;Spinal Manipulations    PT Next Visit Plan  Assess reponse to HEP.  begin LE strengtheing as tolerated and balance/gait training    PT Home Exercise Plan  05/04/20: ab set, ab march, glute set, bridge, LTR    Consulted and Agree with Plan of Care  Patient       Patient will benefit from skilled therapeutic intervention in order to improve the following deficits and impairments:  Abnormal gait, Decreased activity tolerance, Decreased balance, Decreased mobility, Decreased range of motion, Decreased endurance, Decreased strength, Difficulty walking, Impaired flexibility, Postural dysfunction, Pain, Obesity  Visit Diagnosis: Low back pain, unspecified back pain laterality, unspecified chronicity, unspecified whether sciatica present  Muscle weakness (generalized)  Other abnormalities of gait and mobility  Other symptoms and signs involving the musculoskeletal system     Problem List Patient Active Problem List   Diagnosis Date Noted  . Fibromyalgia 04/20/2020  . Routine cervical smear 04/18/2020  . Encounter for gynecological examination with  Papanicolaou smear of cervix 04/18/2020  . Encounter for colorectal cancer screening 04/18/2020  . Chronic female pelvic pain 04/18/2020  . Proteinuria 04/18/2020  . Bad odor of urine 04/18/2020  . Klebsiella pneumonia ([place]) 01/30/2019  . E. coli UTI (urinary tract infection) 01/30/2019  . Complicated UTI (urinary tract infection) 01/29/2019  . Levoscoliosis 11/20/2017  . Staghorn kidney stones 08/08/2017  . GI bleed 05/29/2017  . Sepsis due to urinary tract infection ([place]) 05/29/2017  . UTI (urinary tract infection) 05/29/2017  . Cervicalgia 11/21/2016  . Chronic pain syndrome 10/11/2015  . Lumbar radiculopathy 07/25/2015  . Fibroid 10/13/2013  . Adnexal cyst 10/13/2013  . PMB (postmenopausal bleeding) 10/06/2013  . Unspecified symptom associated with female genital organs 10/06/2013  . Hot flashes 10/06/2013  . Internal hemorrhoids with other complication 69/48/5462  . 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 Hip pain 04/09/2013  . Hemorrhoids, internal 03/12/2013  . Back pain 03/11/2013  . Encounter for therapeutic drug monitoring 03/11/2013  . Trochanteric bursitis 03/11/2013  . Lumbar facet arthropathy 03/11/2013  . Unstable angina ([place]) 06/02/2012  . Acute on chronic renal insufficiency 06/02/2012  . Chest pain 04/22/2012  . IBS (irritable bowel syndrome)-DIARRHEA 01/23/2012  . Morbid obesity ([place]) 08/09/2011  . Diabetes mellitus ([place]) 08/09/2011  . DIARRHEA 10/11/2010  . Chronic diastolic heart failure ([place]) 12/16/2007  . Pulmonary hypertension ([place]) 12/12/2007  . OSTEOARTHRITIS 12/11/2007  . SLEEP APNEA 12/11/2007   10:27 AM, 05/04/20 Josue Hector PT DPT  Physical Therapist with [facility]  [phone] [place], [place], [address] Phone: [phone]   Fax:  [phone]  Name: LAVIDA PATCH MRN: [id number] Date of Birth: 07-Dec-1956

## 2020-05-04 NOTE — Patient Instructions (Signed)
Access Code: [email or web address] URL: [email or web address] Date: 05/04/2020 Prepared by: Josue Hector  Exercises Supine Transversus Abdominis Bracing - Hands on Stomach - 2 x daily - 7 x weekly - 2 sets - 10 reps - 5 hold Supine Gluteal Sets - 2 x daily - 7 x weekly - 2 sets - 10 reps - 5 hold Supine Lower Trunk Rotation - 2 x daily - 7 x weekly - 2 sets - 10 reps - 5 hold Supine Bridge - 2 x daily - 7 x weekly - 2 sets - 10 reps - 5 hold Supine March - 2 x daily - 7 x weekly - 2 sets - 10 reps

## 2020-05-10 ENCOUNTER — Encounter (HOSPITAL_COMMUNITY): Payer: Self-pay

## 2020-05-10 ENCOUNTER — Other Ambulatory Visit: Payer: Self-pay

## 2020-05-10 ENCOUNTER — Ambulatory Visit (HOSPITAL_COMMUNITY): Payer: Medicare HMO

## 2020-05-10 DIAGNOSIS — R29898 Other symptoms and signs involving the musculoskeletal system: Secondary | ICD-10-CM

## 2020-05-10 DIAGNOSIS — M545 Low back pain, unspecified: Secondary | ICD-10-CM

## 2020-05-10 DIAGNOSIS — M6281 Muscle weakness (generalized): Secondary | ICD-10-CM | POA: Diagnosis not present

## 2020-05-10 DIAGNOSIS — R2689 Other abnormalities of gait and mobility: Secondary | ICD-10-CM | POA: Diagnosis not present

## 2020-05-10 NOTE — Therapy (Addendum)
[place], [place], [address] Phone: [phone]   Fax:  [phone]  Physical Therapy Treatment  Patient Details  Name: Sara Tran MRN: [id number] Date of Birth: 1956-08-18 Referring Provider (PT): Alger Simons MD   Encounter Date: 05/10/2020  PT End of Session - 05/10/20 1625    Visit Number  3    Number of Visits  8    Date for PT Re-Evaluation  05/31/20    Authorization Type  [employer]    Authorization Time Period  05/01/20-05/31/20    Authorization - Visit Number  3    Authorization - Number of Visits  8    PT Start Time  3536    PT Stop Time  1443    PT Time Calculation (min)  39 min    Activity Tolerance  Patient tolerated treatment well;Patient limited by fatigue;No increased pain    Behavior During Therapy  WFL for tasks assessed/performed       Past Medical History:  Diagnosis Date  . Adnexal cyst    Right simple cyst seen on [place]  . Anginal pain ([place])   . Anxiety   . Arthritis   . Asthma   . Atrial fibrillation (HCC)    hx of   . CHF (congestive heart failure) ([place])   . Chronic back pain   . Chronic hip pain   . COPD (chronic obstructive pulmonary disease) ([place])   . Coronary artery disease    stents placed   . Dyspnea   . GERD (gastroesophageal reflux disease)   . Headache    due to pinched nerve damaage   . Heart murmur    hx of slight murmur   . History of bronchitis   . History of cardiac catheterization    Normal coronaries 2013  . History of kidney stones   . Hot flashes   . Hypertension   . Hypertriglyceridemia   . IBS (irritable bowel syndrome)   . Internal hemorrhoids   . Morbid obesity ([place])    s/p lap band surgery  . Myocardial infarction ([place])    times 3  . Obesity   . Pulmonary HTN ([place])    [place] 2008:  RA pressures 13/10 with a mean of 7 mmHg.  RV pressure 15/40 with end diastolic pressure of 15 mmHg.  PA pressure 49/23 with a mean of 35 mmHg.  Pulmonary  capillary wedge 17/50 with a mean of 14 mmHg. The PA saturation was 68%.  RA saturation was 71% and aortic saturation was 90%.  Cardiac output was 6.0 with a cardiac index of 2.80 by Fick.  Normal coronaries by cath 2008.  [place] Kitchen Renal insufficiency   . Sciatica of right side   . Sleep apnea    CPAP use does not know settings   . Trauma   . Type 2 diabetes mellitus ([place])    type II   . Uterine fibroid     Past Surgical History:  Procedure Laterality Date  . APPENDECTOMY    . CARDIAC CATHETERIZATION  07/2007, 05/2012   Normal coronary arteries  . COLONOSCOPY  10/18/10   SLF:2-mm sessile sigmoid polyp/diverticula  . CYSTOSCOPY/URETEROSCOPY/HOLMIUM LASER/STENT PLACEMENT Right 08/01/2017   Procedure: CYSTOSCOPY/ RIGHT URETEROSCOPY/ RIGHT RETROGRADE/ HOLMIUM LASER  AND RIGHT STENT PLACEMENT FIRST STAGE;  Surgeon: Irine Seal, MD;  Location: [facility];  Service: Urology;  Laterality: Right;  . CYSTOSCOPY/URETEROSCOPY/HOLMIUM LASER/STENT PLACEMENT Right 08/08/2017   Procedure: RIGHT URETEROSCOPY WITH HOLMIUM  LASER RIGHT STENT PLACEMENT;  Surgeon: Irine Seal, MD;  Location: [facility];  Service: Urology;  Laterality: Right;  . EXTRACORPOREAL SHOCK WAVE LITHOTRIPSY Right 08/22/2017   Procedure: RIGHT EXTRACORPOREAL SHOCK WAVE LITHOTRIPSY (ESWL);  Surgeon: Alexis Frock, MD;  Location: [facility];  Service: Urology;  Laterality: Right;  . HEMORRHOID BANDING  07/2012   Dr. Oneida Alar  . LAPAROSCOPIC CHOLECYSTECTOMY  1985  . LAPAROSCOPIC GASTRIC BANDING  12/12/2010  . LEFT AND RIGHT HEART CATHETERIZATION WITH CORONARY ANGIOGRAM N/A 06/03/2012   Procedure: LEFT AND RIGHT HEART CATHETERIZATION WITH CORONARY ANGIOGRAM;  Surgeon: Jolaine Artist, MD;  Location: [facility] CATH LAB;  Service: Cardiovascular;  Laterality: N/A;  . PILONIDAL CYST EXCISION  1974  . RIGHT/LEFT HEART CATH AND CORONARY ANGIOGRAPHY N/A 03/28/2020   Procedure: RIGHT/LEFT HEART CATH AND CORONARY ANGIOGRAPHY;  Surgeon: Jolaine Artist, MD;  Location: [facility];  Service: Cardiovascular;  Laterality: N/A;  . 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 TUMOR EXCISION  10/2011   Face    There were no vitals filed for this visit.  Subjective Assessment - 05/10/20 1622    Subjective  Pt stated her back is hurting and spasming today, pain scale 8/10.  Reports complaince with HEP daily.  Reports legs swell when sitting    Pertinent History  chronic pain, lumbar radic bilateral, chronic low back pain, Fibromyalgia, obesity, CHF    Patient Stated Goals  walk better    Currently in Pain?  Yes    Pain Score  8     Pain Location  Back    Pain Orientation  Lower    Pain Descriptors / Indicators  Aching;Sore    Pain Type  Chronic pain    Pain Onset  More than a month ago    Pain Frequency  Constant                       [facility] Adult PT Treatment/Exercise - 05/10/20 0001      Exercises   Exercises  Lumbar      Lumbar Exercises: Stretches   Lower Trunk Rotation  5 reps;10 seconds    Lower Trunk Rotation Limitations  able to recall and demonstrate appropraite mechanics with HEP      Lumbar Exercises: Standing   Other Standing Lumbar Exercises  hip abd 10x BLE (cueing to reduce sidebend)      Lumbar Exercises: Seated   Sit to Stand  5 reps    Sit to Stand Limitations  eccentric control      Lumbar Exercises: Supine   Ab Set  5 reps    AB Set Limitations  able to recall and demonstrate appropraite mechanics with HEP    Pelvic Tilt  10 reps;5 seconds    Pelvic Tilt Limitations  able to recall and demonstrate appropraite mechanics with HEP    Glut Set  10 reps;5 seconds    Glut Set Limitations  able to recall and demonstrate appropraite mechanics with HEP    Bent Knee Raise  20 reps;3 seconds    Bent Knee Raise Limitations  able to recall and demonstrate appropraite mechanics with HEP    Bridge  10 reps;5 seconds    Bridge Limitations  able to recall and demonstrate appropraite mechanics with HEP      Lumbar  Exercises: Sidelying   Clam  Both;10 reps;3 seconds    Clam Limitations  cueing for stability  PT Education - 05/10/20 1635    Education Details  Reviewed HEP, pt able to demonstrate and verbalize all exercises wihtout cueing.  Pt educated on benefits of compression hose to assist with edema control, measurements taken and paperwork given to pt.    Person(s) Educated  Patient    Methods  Explanation;Demonstration;Handout    Comprehension  Verbalized understanding       PT Short Term Goals - 05/03/20 1616      PT SHORT TERM GOAL #1   Title  Patient will be independent with HEP in order to improve functional outcomes.    Time  2    Period  Weeks    Status  New    Target Date  05/17/20      PT SHORT TERM GOAL #2   Title  Patient will report at least 25% improvement in symptoms for improved quality of life.    Time  2    Period  Weeks    Status  New    Target Date  05/17/20      PT SHORT TERM GOAL #3   Title  Pt to be able to complete 10 mintues of light housework with her back pain not increasing past a 7/10.    Time  2    Period  Weeks    Status  New    Target Date  05/17/20        PT Long Term Goals - 05/03/20 1617      PT LONG TERM GOAL #1   Title  Patient will report at least 75% improvement in symptoms for improved quality of life.    Time  4    Period  Weeks    Status  New    Target Date  05/31/20      PT LONG TERM GOAL #2   Title  Patient will demonstrate at least 25% improvement in lumbar ROM in all planes for improved ability to move while at home.    Time  4    Period  Weeks    Status  New    Target Date  05/31/20      PT LONG TERM GOAL #3   Title  Patient will be able to ambulate at least 1/4 mile without back pain greater than 8/10 for improved community ambulation.    Time  4    Period  Weeks    Status  New    Target Date  05/31/20            Plan - 05/10/20 1641    Clinical Impression Statement  Reviewed compliance  wiht HEP, pt able to demonstrate and verbalize all exercises correctly wihtout cueing reuqired.  Pt educated on benefits of compression hose to address edema BLE when gravity dependent.  Measurements taken and pt given handout for [place] focus on LE strengthening with additional exercises.  Pt did c/o intermittnet cramping in hip musculature that was subsided wiht rest rebreaks.  EOS pt reports pain reduced, requested additional printout of exercises today- added clam and STS to HEP.    Personal Factors and Comorbidities  Comorbidity 3+;Education;Past/Current Experience;Time since onset of injury/illness/exacerbation    Comorbidities  chronic pain, lumbar radic bilateral, chronic low back pain, Fibromyalgia, obesity, CHF, diabetes    Examination-Activity Limitations  Bed Mobility;Bend;Carry;Locomotion Level;Sit;Squat;Stairs;Stand;Transfers    Examination-Participation Restrictions  Cleaning;Shop;Volunteer;Yard Work;Meal Prep    Stability/Clinical Decision Making  Evolving/Moderate complexity    Clinical Decision Making  Moderate  Rehab Potential  Fair    PT Frequency  2x / week    PT Duration  4 weeks    PT Treatment/Interventions  ADLs/Self Care Home Management;Aquatic Therapy;Biofeedback;Electrical Stimulation;Iontophoresis 4mg /ml Dexamethasone;Moist Heat;Traction;Ultrasound;Parrafin;Fluidtherapy;Cryotherapy;Contrast Bath;DME Instruction;Gait training;Neuromuscular re-education;Stair training;Functional mobility training;Therapeutic activities;Therapeutic exercise;Balance training;Patient/family education;Orthotic Fit/Training;Manual techniques;Manual lymph drainage;Compression bandaging;Scar mobilization;Passive range of motion;Dry needling;Energy conservation;Splinting;Taping;Vasopneumatic Device;Joint Manipulations;Spinal Manipulations    PT Next Visit Plan  Progress LE strengtheing as tolerated and balance/gait training    PT Home Exercise Plan  05/04/20: ab set, ab march,  glute set, bridge, LTR; 5/11: STS and clam.       Patient will benefit from skilled therapeutic intervention in order to improve the following deficits and impairments:  Abnormal gait, Decreased activity tolerance, Decreased balance, Decreased mobility, Decreased range of motion, Decreased endurance, Decreased strength, Difficulty walking, Impaired flexibility, Postural dysfunction, Pain, Obesity  Visit Diagnosis: Low back pain, unspecified back pain laterality, unspecified chronicity, unspecified whether sciatica present  Muscle weakness (generalized)  Other abnormalities of gait and mobility  Other symptoms and signs involving the musculoskeletal system     Problem List Patient Active Problem List   Diagnosis Date Noted  . Fibromyalgia 04/20/2020  . Routine cervical smear 04/18/2020  . Encounter for gynecological examination with Papanicolaou smear of cervix 04/18/2020  . Encounter for colorectal cancer screening 04/18/2020  . Chronic female pelvic pain 04/18/2020  . Proteinuria 04/18/2020  . Bad odor of urine 04/18/2020  . Klebsiella pneumonia ([place]) 01/30/2019  . E. coli UTI (urinary tract infection) 01/30/2019  . Complicated UTI (urinary tract infection) 01/29/2019  . Levoscoliosis 11/20/2017  . [place] kidney stones 08/08/2017  . GI bleed 05/29/2017  . Sepsis due to urinary tract infection ([place]) 05/29/2017  . UTI (urinary tract infection) 05/29/2017  . Cervicalgia 11/21/2016  . Chronic pain syndrome 10/11/2015  . Lumbar radiculopathy 07/25/2015  . Fibroid 10/13/2013  . Adnexal cyst 10/13/2013  . PMB (postmenopausal bleeding) 10/06/2013  . Unspecified symptom associated with female genital organs 10/06/2013  . Hot flashes 10/06/2013  . Internal hemorrhoids with other complication 05/69/7948  . 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 Hip pain 04/09/2013  . Hemorrhoids, internal 03/12/2013  . Back pain 03/11/2013  . Encounter for therapeutic drug monitoring  03/11/2013  . Trochanteric bursitis 03/11/2013  . Lumbar facet arthropathy 03/11/2013  . Unstable angina ([place]) 06/02/2012  . Acute on chronic renal insufficiency 06/02/2012  . Chest pain 04/22/2012  . IBS (irritable bowel syndrome)-DIARRHEA 01/23/2012  . Morbid obesity ([place]) 08/09/2011  . Diabetes mellitus ([place]) 08/09/2011  . DIARRHEA 10/11/2010  . Chronic diastolic heart failure ([place]) 12/16/2007  . Pulmonary hypertension ([place]) 12/12/2007  . OSTEOARTHRITIS 12/11/2007  . SLEEP APNEA 12/11/2007   Ihor Austin, LPTA/CLT; CBIS [phone]  [name] 05/10/2020, 6:03 PM  [facility] [address] [place], [place], [address] Phone: [phone]   Fax:  [phone]  Name: CELE MOTE MRN: [id number] Date of Birth: [birth year]

## 2020-05-10 NOTE — Patient Instructions (Signed)
Functional Quadriceps: Sit to Stand    Sit on edge of chair, feet flat on floor. Stand upright, extending knees fully. Repeat 5 times per set. Do 2 sets per session.   http://orth.exer.us/735   Copyright  VHI. All rights reserved.   Abduction: Clam (Eccentric) - Side-Lying    Lie on side with knees bent. Lift top knee, keeping feet together. Keep trunk steady.  Slowly lower for 3-5 seconds. 10 reps per set, 1 sets per day, 4 days per week.  http://ecce.exer.us/65   Copyright  VHI. All rights reserved.

## 2020-05-12 ENCOUNTER — Ambulatory Visit (HOSPITAL_COMMUNITY): Payer: Medicare HMO

## 2020-05-12 ENCOUNTER — Encounter (HOSPITAL_COMMUNITY): Payer: Self-pay

## 2020-05-12 ENCOUNTER — Other Ambulatory Visit: Payer: Self-pay

## 2020-05-12 DIAGNOSIS — R29898 Other symptoms and signs involving the musculoskeletal system: Secondary | ICD-10-CM | POA: Diagnosis not present

## 2020-05-12 DIAGNOSIS — M545 Low back pain, unspecified: Secondary | ICD-10-CM

## 2020-05-12 DIAGNOSIS — R2689 Other abnormalities of gait and mobility: Secondary | ICD-10-CM | POA: Diagnosis not present

## 2020-05-12 DIAGNOSIS — M6281 Muscle weakness (generalized): Secondary | ICD-10-CM | POA: Diagnosis not present

## 2020-05-12 NOTE — Therapy (Addendum)
[place], [place], [address] Phone: [phone]   Fax:  [phone]  Physical Therapy Treatment  Patient Details  Name: Sara Tran MRN: [id number] Date of Birth: Feb 08, 1956 Referring Provider (PT): Alger Simons MD   Encounter Date: 05/12/2020  PT End of Session - 05/12/20 1533    Visit Number  4    Number of Visits  8    Date for PT Re-Evaluation  05/31/20    Authorization Type  [employer]    Authorization Time Period  05/01/20-05/31/20    Authorization - Visit Number  4    Authorization - Number of Visits  8   Progress Note Due on Visit  --    PT Start Time  1450    PT Stop Time  1530    PT Time Calculation (min)  40 min    Activity Tolerance  Patient tolerated treatment well;Patient limited by fatigue;No increased pain    Behavior During Therapy  WFL for tasks assessed/performed       Past Medical History:  Diagnosis Date  . Adnexal cyst    Right simple cyst seen on [place]  . Anginal pain ([place])   . Anxiety   . Arthritis   . Asthma   . Atrial fibrillation (HCC)    hx of   . CHF (congestive heart failure) ([place])   . Chronic back pain   . Chronic hip pain   . COPD (chronic obstructive pulmonary disease) ([place])   . Coronary artery disease    stents placed   . Dyspnea   . GERD (gastroesophageal reflux disease)   . Headache    due to pinched nerve damaage   . Heart murmur    hx of slight murmur   . History of bronchitis   . History of cardiac catheterization    Normal coronaries 2013  . History of kidney stones   . Hot flashes   . Hypertension   . Hypertriglyceridemia   . IBS (irritable bowel syndrome)   . Internal hemorrhoids   . Morbid obesity ([place])    s/p lap band surgery  . Myocardial infarction ([place])    times 3  . Obesity   . Pulmonary HTN ([place])    [place] 2008:  RA pressures 13/10 with a mean of 7 mmHg.  RV pressure 81/01 with end diastolic pressure of 15 mmHg.  PA pressure 49/23 with  a mean of 35 mmHg.  Pulmonary capillary wedge 17/50 with a mean of 14 mmHg. The PA saturation was 68%.  RA saturation was 71% and aortic saturation was 90%.  Cardiac output was 6.0 with a cardiac index of 2.80 by Fick.  Normal coronaries by cath 2008.  Marland Kitchen Renal insufficiency   . Sciatica of right side   . Sleep apnea    CPAP use does not know settings   . Trauma   . Type 2 diabetes mellitus ([place])    type II   . Uterine fibroid     Past Surgical History:  Procedure Laterality Date  . APPENDECTOMY    . CARDIAC CATHETERIZATION  07/2007, 05/2012   Normal coronary arteries  . COLONOSCOPY  10/18/10   SLF:2-mm sessile sigmoid polyp/diverticula  . CYSTOSCOPY/URETEROSCOPY/HOLMIUM LASER/STENT PLACEMENT Right 08/01/2017   Procedure: CYSTOSCOPY/ RIGHT URETEROSCOPY/ RIGHT RETROGRADE/ HOLMIUM LASER  AND RIGHT STENT PLACEMENT FIRST STAGE;  Surgeon: Irine Seal, MD;  Location[facility];  Service: Urology;  Laterality: Right;  . CYSTOSCOPY/URETEROSCOPY/HOLMIUM LASER/STENT PLACEMENT  Right 08/08/2017   Procedure: RIGHT URETEROSCOPY WITH HOLMIUM LASER RIGHT STENT PLACEMENT;  Surgeon: Irine Seal, MD;  [facility];  Service: Urology;  Laterality: Right;  . EXTRACORPOREAL SHOCK WAVE LITHOTRIPSY Right 08/22/2017   Procedure: RIGHT EXTRACORPOREAL SHOCK WAVE LITHOTRIPSY (ESWL);  Surgeon: Alexis Frock, MD;  Location: [facility];  Service: Urology;  Laterality: Right;  . HEMORRHOID BANDING  07/2012   Dr. Oneida Alar  . LAPAROSCOPIC CHOLECYSTECTOMY  1985  . LAPAROSCOPIC GASTRIC BANDING  12/12/2010  . LEFT AND RIGHT HEART CATHETERIZATION WITH CORONARY ANGIOGRAM N/A 06/03/2012   Procedure: LEFT AND RIGHT HEART CATHETERIZATION WITH CORONARY ANGIOGRAM;  Surgeon: Jolaine Artist, MD;  Location: [facility] CATH LAB;  Service: Cardiovascular;  Laterality: N/A;  . PILONIDAL CYST EXCISION  1974  . RIGHT/LEFT HEART CATH AND CORONARY ANGIOGRAPHY N/A 03/28/2020   Procedure: RIGHT/LEFT HEART CATH AND CORONARY ANGIOGRAPHY;  Surgeon: Jolaine Artist, MD;  Location: [facility] CV LAB;  Service: Cardiovascular;  Laterality: N/A;  . 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 TUMOR EXCISION  10/2011   Face    There were no vitals filed for this visit.  Subjective Assessment - 05/12/20 1455    Subjective  Pt stated she had increased pain following sitting in meeting at church for an hour and half, pain scale 6/10.    Pertinent History  chronic pain, lumbar radic bilateral, chronic low back pain, Fibromyalgia, obesity, CHF    Patient Stated Goals  walk better    Currently in Pain?  Yes    Pain Score  6     Pain Location  Back    Pain Orientation  Lower;Left    Pain Descriptors / Indicators  Aching;Sore    Pain Type  Chronic pain    Pain Onset  More than a month ago    Pain Frequency  Constant                        [facility] Adult PT Treatment/Exercise - 05/12/20 0001      Exercises   Exercises  Lumbar      Lumbar Exercises: Standing   Forward Lunge  10 reps    Forward Lunge Limitations  6in step    Other Standing Lumbar Exercises  hip abd 10x BLE (cueing to reduce sidebend)    Other Standing Lumbar Exercises  Rotation 10x each direction      Lumbar Exercises: Seated   Hip Flexion on Ball  5 reps;Limitations    Hip Flexion on Ball Limitations  Thoracic sidebend with UE movement    Sit to Stand  5 reps    Sit to Stand Limitations  eccentric control    Other Seated Lumbar Exercises  Seated posture educatin    Other Seated Lumbar Exercises  RTB rows 2x10; ab sets 5x 5" (counting outloud with contraction             PT Education - 05/12/20 1501    Education Details  Educated on importance of seated posture for pain control       PT Short Term Goals - 05/03/20 1616      PT SHORT TERM GOAL #1   Title  Patient will be independent with HEP in order to improve functional outcomes.    Time  2    Period  Weeks    Status  New    Target Date  05/17/20      PT SHORT TERM GOAL #2  Title  Patient  will report at least 25% improvement in symptoms for improved quality of life.    Time  2    Period  Weeks    Status  New    Target Date  05/17/20      PT SHORT TERM GOAL #3   Title  Pt to be able to complete 10 mintues of light housework with her back pain not increasing past a 7/10.    Time  2    Period  Weeks    Status  New    Target Date  05/17/20        PT Long Term Goals - 05/03/20 1617      PT LONG TERM GOAL #1   Title  Patient will report at least 75% improvement in symptoms for improved quality of life.    Time  4    Period  Weeks    Status  New    Target Date  05/31/20      PT LONG TERM GOAL #2   Title  Patient will demonstrate at least 25% improvement in lumbar ROM in all planes for improved ability to move while at home.    Time  4    Period  Weeks    Status  New    Target Date  05/31/20      PT LONG TERM GOAL #3   Title  Patient will be able to ambulate at least 1/4 mile without back pain greater than 8/10 for improved community ambulation.    Time  4    Period  Weeks    Status  New    Target Date  05/31/20            Plan - 05/12/20 1534    Clinical Impression Statement  Pt educated on importance of seated posture for pain control.  Added lumbar mobility stretches for pain control and functional strengthening exercises.  Pt able to complete all exercises with verbal and tactile cueing for proper form and mechanics wiht new exercises.  EOS pt was limited by fatiuge, no reports of increased pain.    Personal Factors and Comorbidities  Comorbidity 3+;Education;Past/Current Experience;Time since onset of injury/illness/exacerbation    Comorbidities  chronic pain, lumbar radic bilateral, chronic low back pain, Fibromyalgia, obesity, CHF, diabetes    Examination-Activity Limitations  Bed Mobility;Bend;Carry;Locomotion Level;Sit;Squat;Stairs;Stand;Transfers    Examination-Participation Restrictions  Cleaning;Shop;Volunteer;Yard Work;Meal Prep     Stability/Clinical Decision Making  Evolving/Moderate complexity    Clinical Decision Making  Moderate    Rehab Potential  Fair    PT Frequency  2x / week    PT Duration  4 weeks    PT Treatment/Interventions  ADLs/Self Care Home Management;Aquatic Therapy;Biofeedback;Electrical Stimulation;Iontophoresis 4mg /ml Dexamethasone;Moist Heat;Traction;Ultrasound;Parrafin;Fluidtherapy;Cryotherapy;Contrast Bath;DME Instruction;Gait training;Neuromuscular re-education;Stair training;Functional mobility training;Therapeutic activities;Therapeutic exercise;Balance training;Patient/family education;Orthotic Fit/Training;Manual techniques;Manual lymph drainage;Compression bandaging;Scar mobilization;Passive range of motion;Dry needling;Energy conservation;Splinting;Taping;Vasopneumatic Device;Joint Manipulations;Spinal Manipulations    PT Next Visit Plan  Progress LE strengtheing as tolerated and balance/gait training    PT Home Exercise Plan  05/04/20: ab set, ab march, glute set, bridge, LTR; 5/11: STS and clam.       Patient will benefit from skilled therapeutic intervention in order to improve the following deficits and impairments:  Abnormal gait, Decreased activity tolerance, Decreased balance, Decreased mobility, Decreased range of motion, Decreased endurance, Decreased strength, Difficulty walking, Impaired flexibility, Postural dysfunction, Pain, Obesity  Visit Diagnosis: Low back pain, unspecified back pain laterality, unspecified chronicity, unspecified whether sciatica present  Muscle weakness (  generalized)  Other abnormalities of gait and mobility  Other symptoms and signs involving the musculoskeletal system     Problem List Patient Active Problem List   Diagnosis Date Noted  . Fibromyalgia 04/20/2020  . Routine cervical smear 04/18/2020  . Encounter for gynecological examination with Papanicolaou smear of cervix 04/18/2020  . Encounter for colorectal cancer screening 04/18/2020  .  Chronic female pelvic pain 04/18/2020  . Proteinuria 04/18/2020  . Bad odor of urine 04/18/2020  . Klebsiella pneumonia ([place]) 01/30/2019  . E. coli UTI (urinary tract infection) 01/30/2019  . Complicated UTI (urinary tract infection) 01/29/2019  . Levoscoliosis 11/20/2017  . Staghorn kidney stones 08/08/2017  . GI bleed 05/29/2017  . Sepsis due to urinary tract infection ([place]) 05/29/2017  . UTI (urinary tract infection) 05/29/2017  . Cervicalgia 11/21/2016  . Chronic pain syndrome 10/11/2015  . Lumbar radiculopathy 07/25/2015  . Fibroid 10/13/2013  . Adnexal cyst 10/13/2013  . PMB (postmenopausal bleeding) 10/06/2013  . Unspecified symptom associated with female genital organs 10/06/2013  . Hot flashes 10/06/2013  . Internal hemorrhoids with other complication 62/83/1517  . 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 Hip pain 04/09/2013  . Hemorrhoids, internal 03/12/2013  . Back pain 03/11/2013  . Encounter for therapeutic drug monitoring 03/11/2013  . Trochanteric bursitis 03/11/2013  . Lumbar facet arthropathy 03/11/2013  . Unstable angina ([place]) 06/02/2012  . Acute on chronic renal insufficiency 06/02/2012  . Chest pain 04/22/2012  . IBS (irritable bowel syndrome)-DIARRHEA 01/23/2012  . Morbid obesity ([place]) 08/09/2011  . Diabetes mellitus ([place]) 08/09/2011  . DIARRHEA 10/11/2010  . Chronic diastolic heart failure ([place]) 12/16/2007  . Pulmonary hypertension ([place]) 12/12/2007  . OSTEOARTHRITIS 12/11/2007  . SLEEP APNEA 12/11/2007   Ihor Austin, LPTA/CLT; CBIS [phone]  [name] 05/12/2020, 3:41 PM  [facility] [address] [place], [place], [address] Phone: [phone]   Fax:  [phone]  Name: Sara Tran MRN: [id number] Date of Birth: 1956/12/07

## 2020-05-17 ENCOUNTER — Other Ambulatory Visit: Payer: Self-pay

## 2020-05-17 ENCOUNTER — Ambulatory Visit (HOSPITAL_COMMUNITY): Payer: Medicare HMO | Admitting: Physical Therapy

## 2020-05-17 DIAGNOSIS — M6281 Muscle weakness (generalized): Secondary | ICD-10-CM | POA: Diagnosis not present

## 2020-05-17 DIAGNOSIS — R2689 Other abnormalities of gait and mobility: Secondary | ICD-10-CM | POA: Diagnosis not present

## 2020-05-17 DIAGNOSIS — M545 Low back pain, unspecified: Secondary | ICD-10-CM

## 2020-05-17 DIAGNOSIS — R29898 Other symptoms and signs involving the musculoskeletal system: Secondary | ICD-10-CM | POA: Diagnosis not present

## 2020-05-17 NOTE — Therapy (Signed)
[place], [place], [address] Phone: [phone]   Fax:  [phone]  Physical Therapy Treatment  Patient Details  Name: Sara Tran MRN: [id number] Date of Birth: 10/23/1956 Referring Provider (PT): Alger Simons MD   Encounter Date: 05/17/2020  PT End of Session - 05/17/20 1536    Visit Number  5    Number of Visits  8    Date for PT Re-Evaluation  05/31/20    Authorization Type  [employer] Medicare; FOTO was not initiated    Authorization Time Period  05/01/20-05/31/20    Authorization - Visit Number  5    Authorization - Number of Visits  7    PT Start Time  0263    PT Stop Time  7858    PT Time Calculation (min)  43 min       Past Medical History:  Diagnosis Date  . Adnexal cyst    Right simple cyst seen on [place]  . Anginal pain ([place])   . Anxiety   . Arthritis   . Asthma   . Atrial fibrillation (HCC)    hx of   . CHF (congestive heart failure) ([place])   . Chronic back pain   . Chronic hip pain   . COPD (chronic obstructive pulmonary disease) ([place])   . Coronary artery disease    stents placed   . Dyspnea   . GERD (gastroesophageal reflux disease)   . Headache    due to pinched nerve damaage   . Heart murmur    hx of slight murmur   . History of bronchitis   . History of cardiac catheterization    Normal coronaries 2013  . History of kidney stones   . Hot flashes   . Hypertension   . Hypertriglyceridemia   . IBS (irritable bowel syndrome)   . Internal hemorrhoids   . Morbid obesity ([place])    s/p lap band surgery  . Myocardial infarction ([place])    times 3  . Obesity   . Pulmonary HTN ([place])    [place] 2008:  RA pressures 13/10 with a mean of 7 mmHg.  RV pressure 85/02 with end diastolic pressure of 15 mmHg.  PA pressure 49/23 with a mean of 35 mmHg.  Pulmonary capillary wedge 17/50 with a mean of 14 mmHg. The PA saturation was 68%.  RA saturation was 71% and aortic saturation was 90%.  Cardiac output  was 6.0 with a cardiac index of 2.80 by Fick.  Normal coronaries by cath 2008.  Marland Kitchen Renal insufficiency   . Sciatica of right side   . Sleep apnea    CPAP use does not know settings   . Trauma   . Type 2 diabetes mellitus ([place])    type II   . Uterine fibroid     Past Surgical History:  Procedure Laterality Date  . APPENDECTOMY    . CARDIAC CATHETERIZATION  07/2007, 05/2012   Normal coronary arteries  . COLONOSCOPY  10/18/10   SLF:2-mm sessile sigmoid polyp/diverticula  . CYSTOSCOPY/URETEROSCOPY/HOLMIUM LASER/STENT PLACEMENT Right 08/01/2017   Procedure: CYSTOSCOPY/ RIGHT URETEROSCOPY/ RIGHT RETROGRADE/ HOLMIUM LASER  AND RIGHT STENT PLACEMENT FIRST STAGE;  Surgeon: Irine Seal, MD;  [facility];  Service: Urology;  Laterality: Right;  . CYSTOSCOPY/URETEROSCOPY/HOLMIUM LASER/STENT PLACEMENT Right 08/08/2017   Procedure: RIGHT URETEROSCOPY WITH HOLMIUM LASER RIGHT STENT PLACEMENT;  Surgeon: Irine Seal, MD;  [facility];  Service: Urology;  Laterality: Right;  . EXTRACORPOREAL  SHOCK WAVE LITHOTRIPSY Right 08/22/2017   Procedure: RIGHT EXTRACORPOREAL SHOCK WAVE LITHOTRIPSY (ESWL);  Surgeon: Alexis Frock, MD;  [facility];  Service: Urology;  Laterality: Right;  . HEMORRHOID BANDING  07/2012   Dr. Oneida Alar  . LAPAROSCOPIC CHOLECYSTECTOMY  1985  . LAPAROSCOPIC GASTRIC BANDING  12/12/2010  . LEFT AND RIGHT HEART CATHETERIZATION WITH CORONARY ANGIOGRAM N/A 06/03/2012   Procedure: LEFT AND RIGHT HEART CATHETERIZATION WITH CORONARY ANGIOGRAM;  Surgeon: Jolaine Artist, MD;  Location: [facility] CATH LAB;  Service: Cardiovascular;  Laterality: N/A;  . PILONIDAL CYST EXCISION  1974  . RIGHT/LEFT HEART CATH AND CORONARY ANGIOGRAPHY N/A 03/28/2020   Procedure: RIGHT/LEFT HEART CATH AND CORONARY ANGIOGRAPHY;  Surgeon: Jolaine Artist, MD;  Location: [facility] CV LAB;  Service: Cardiovascular;  Laterality: N/A;  . 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 TUMOR EXCISION  10/2011   Face     There were no vitals filed for this visit.  Subjective Assessment - 05/17/20 1454    Subjective  pt states her knee pain is mostly a 6/10 but shoots up to 9/10 at times.  States it just wont get better.    Currently in Pain?  Yes    Pain Score  6     Pain Location  Knee    Pain Orientation  Left    Pain Descriptors / Indicators  Aching;Sore    Pain Type  Chronic pain    Pain Radiating Towards  mostly in Lt thigh and knee but also hurts in back and sometimes ratiates into Rt hip and around left knee.                        [facility] Adult PT Treatment/Exercise - 05/17/20 0001      Lumbar Exercises: Standing   Forward Lunge  10 reps    Forward Lunge Limitations  6in step    Row  Both;10 reps;Theraband    Theraband Level (Row)  Level 2 (Red)    Other Standing Lumbar Exercises  hip abd 10x BLE (cueing to reduce sidebend)    Other Standing Lumbar Exercises  lumbar extension and Rotation 10x each direction      Lumbar Exercises: Seated   Sit to Stand  10 reps    Sit to Stand Limitations  eccentric control               PT Short Term Goals - 05/03/20 1616      PT SHORT TERM GOAL #1   Title  Patient will be independent with HEP in order to improve functional outcomes.    Time  2    Period  Weeks    Status  New    Target Date  05/17/20      PT SHORT TERM GOAL #2   Title  Patient will report at least 25% improvement in symptoms for improved quality of life.    Time  2    Period  Weeks    Status  New    Target Date  05/17/20      PT SHORT TERM GOAL #3   Title  Pt to be able to complete 10 mintues of light housework with her back pain not increasing past a 7/10.    Time  2    Period  Weeks    Status  New    Target Date  05/17/20        PT Long Term Goals -  05/03/20 1617      PT LONG TERM GOAL #1   Title  Patient will report at least 75% improvement in symptoms for improved quality of life.    Time  4    Period  Weeks    Status  New     Target Date  05/31/20      PT LONG TERM GOAL #2   Title  Patient will demonstrate at least 25% improvement in lumbar ROM in all planes for improved ability to move while at home.    Time  4    Period  Weeks    Status  New    Target Date  05/31/20      PT LONG TERM GOAL #3   Title  Patient will be able to ambulate at least 1/4 mile without back pain greater than 8/10 for improved community ambulation.    Time  4    Period  Weeks    Status  New    Target Date  05/31/20            Plan - 05/17/20 1536    Clinical Impression Statement  Continued with established therex.  PT able to complete with intermittent c/o pain/spasm in Lt LE and sometimes in Rt LE.  Progressed rows with theraband to standing to work on stability and core strength.  PT with noted fatigue at end of session.    Personal Factors and Comorbidities  Comorbidity 3+;Education;Past/Current Experience;Time since onset of injury/illness/exacerbation    Comorbidities  chronic pain, lumbar radic bilateral, chronic low back pain, Fibromyalgia, obesity, CHF, diabetes    Examination-Activity Limitations  Bed Mobility;Bend;Carry;Locomotion Level;Sit;Squat;Stairs;Stand;Transfers    Examination-Participation Restrictions  Cleaning;Shop;Volunteer;Yard Work;Meal Prep    Stability/Clinical Decision Making  Evolving/Moderate complexity    Rehab Potential  Fair    PT Frequency  2x / week    PT Duration  4 weeks    PT Treatment/Interventions  ADLs/Self Care Home Management;Aquatic Therapy;Biofeedback;Electrical Stimulation;Iontophoresis 4mg /ml Dexamethasone;Moist Heat;Traction;Ultrasound;Parrafin;Fluidtherapy;Cryotherapy;Contrast Bath;DME Instruction;Gait training;Neuromuscular re-education;Stair training;Functional mobility training;Therapeutic activities;Therapeutic exercise;Balance training;Patient/family education;Orthotic Fit/Training;Manual techniques;Manual lymph drainage;Compression bandaging;Scar mobilization;Passive range of  motion;Dry needling;Energy conservation;Splinting;Taping;Vasopneumatic Device;Joint Manipulations;Spinal Manipulations    PT Next Visit Plan  Progress LE strengtheing as tolerated and balance/gait training    PT Home Exercise Plan  05/04/20: ab set, ab march, glute set, bridge, LTR; 5/11: STS and clam.       Patient will benefit from skilled therapeutic intervention in order to improve the following deficits and impairments:  Abnormal gait, Decreased activity tolerance, Decreased balance, Decreased mobility, Decreased range of motion, Decreased endurance, Decreased strength, Difficulty walking, Impaired flexibility, Postural dysfunction, Pain, Obesity  Visit Diagnosis: Low back pain, unspecified back pain laterality, unspecified chronicity, unspecified whether sciatica present  Muscle weakness (generalized)  Other abnormalities of gait and mobility  Other symptoms and signs involving the musculoskeletal system     Problem List Patient Active Problem List   Diagnosis Date Noted  . Fibromyalgia 04/20/2020  . Routine cervical smear 04/18/2020  . Encounter for gynecological examination with Papanicolaou smear of cervix 04/18/2020  . Encounter for colorectal cancer screening 04/18/2020  . Chronic female pelvic pain 04/18/2020  . Proteinuria 04/18/2020  . Bad odor of urine 04/18/2020  . Klebsiella pneumonia ([place]) 01/30/2019  . E. coli UTI (urinary tract infection) 01/30/2019  . Complicated UTI (urinary tract infection) 01/29/2019  . Levoscoliosis 11/20/2017  . Staghorn kidney stones 08/08/2017  . GI bleed 05/29/2017  . Sepsis due to urinary tract infection ([place]) 05/29/2017  .  UTI (urinary tract infection) 05/29/2017  . Cervicalgia 11/21/2016  . Chronic pain syndrome 10/11/2015  . Lumbar radiculopathy 07/25/2015  . Fibroid 10/13/2013  . Adnexal cyst 10/13/2013  . PMB (postmenopausal bleeding) 10/06/2013  . Unspecified symptom associated with female genital organs 10/06/2013  . Hot  flashes 10/06/2013  . Internal hemorrhoids with other complication 40/98/1191  . 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 Hip pain 04/09/2013  . Hemorrhoids, internal 03/12/2013  . Back pain 03/11/2013  . Encounter for therapeutic drug monitoring 03/11/2013  . Trochanteric bursitis 03/11/2013  . Lumbar facet arthropathy 03/11/2013  . Unstable angina ([place]) 06/02/2012  . Acute on chronic renal insufficiency 06/02/2012  . Chest pain 04/22/2012  . IBS (irritable bowel syndrome)-DIARRHEA 01/23/2012  . Morbid obesity ([place]) 08/09/2011  . Diabetes mellitus ([place]) 08/09/2011  . DIARRHEA 10/11/2010  . Chronic diastolic heart failure ([place]) 12/16/2007  . Pulmonary hypertension ([place]) 12/12/2007  . OSTEOARTHRITIS 12/11/2007  . SLEEP APNEA 12/11/2007   Teena Irani, PTA/CLT [phone]  Teena Irani 05/17/2020, 3:39 PM  [facility] [address] [place], [place], [address] Phone: [phone]   Fax:  [phone]  Name: Sara Tran MRN: [id number] Date of Birth: 07-26-1956

## 2020-05-20 ENCOUNTER — Ambulatory Visit (HOSPITAL_COMMUNITY): Payer: Medicare HMO | Admitting: Physical Therapy

## 2020-05-20 ENCOUNTER — Other Ambulatory Visit: Payer: Self-pay

## 2020-05-20 ENCOUNTER — Encounter (HOSPITAL_COMMUNITY): Payer: Self-pay | Admitting: Physical Therapy

## 2020-05-20 DIAGNOSIS — R2689 Other abnormalities of gait and mobility: Secondary | ICD-10-CM | POA: Diagnosis not present

## 2020-05-20 DIAGNOSIS — M545 Low back pain, unspecified: Secondary | ICD-10-CM

## 2020-05-20 DIAGNOSIS — M6281 Muscle weakness (generalized): Secondary | ICD-10-CM | POA: Diagnosis not present

## 2020-05-20 DIAGNOSIS — R29898 Other symptoms and signs involving the musculoskeletal system: Secondary | ICD-10-CM

## 2020-05-20 NOTE — Therapy (Signed)
[place], [place], [address] Phone: [phone]   Fax:  [phone]  Physical Therapy Treatment  Patient Details  Name: Sara Tran MRN: [id number] Date of Birth: Jan 27, 1956 Referring Provider (PT): Alger Simons MD   Encounter Date: 05/20/2020  PT End of Session - 05/20/20 1358    Visit Number  6    Number of Visits  8    Date for PT Re-Evaluation  05/31/20    Authorization Type  [employer]; FOTO was not initiated    Authorization Time Period  05/01/20-05/31/20    Authorization - Visit Number  6    Authorization - Number of Visits  8    PT Start Time  1400    PT Stop Time  1440    PT Time Calculation (min)  40 min       Past Medical History:  Diagnosis Date  . Adnexal cyst    Right simple cyst seen on [place]  . Anginal pain ([place])   . Anxiety   . Arthritis   . Asthma   . Atrial fibrillation (HCC)    hx of   . CHF (congestive heart failure) ([place])   . Chronic back pain   . Chronic hip pain   . COPD (chronic obstructive pulmonary disease) ([place])   . Coronary artery disease    stents placed   . Dyspnea   . GERD (gastroesophageal reflux disease)   . Headache    due to pinched nerve damaage   . Heart murmur    hx of slight murmur   . History of bronchitis   . History of cardiac catheterization    Normal coronaries 2013  . History of kidney stones   . Hot flashes   . Hypertension   . Hypertriglyceridemia   . IBS (irritable bowel syndrome)   . Internal hemorrhoids   . Morbid obesity ([place])    s/p lap band surgery  . Myocardial infarction ([place])    times 3  . Obesity   . Pulmonary HTN ([place])    [place] 2008:  RA pressures 13/10 with a mean of 7 mmHg.  RV pressure 67/59 with end diastolic pressure of 15 mmHg.  PA pressure 49/23 with a mean of 35 mmHg.  Pulmonary capillary wedge 17/50 with a mean of 14 mmHg. The PA saturation was 68%.  RA saturation was 71% and aortic saturation was 90%.  Cardiac output  was 6.0 with a cardiac index of 2.80 by Fick.  Normal coronaries by cath 2008.  [place] Kitchen Renal insufficiency   . Sciatica of right side   . Sleep apnea    CPAP use does not know settings   . Trauma   . Type 2 diabetes mellitus ([place])    type II   . Uterine fibroid     Past Surgical History:  Procedure Laterality Date  . APPENDECTOMY    . CARDIAC CATHETERIZATION  07/2007, 05/2012   Normal coronary arteries  . COLONOSCOPY  10/18/10   SLF:2-mm sessile sigmoid polyp/diverticula  . CYSTOSCOPY/URETEROSCOPY/HOLMIUM LASER/STENT PLACEMENT Right 08/01/2017   Procedure: CYSTOSCOPY/ RIGHT URETEROSCOPY/ RIGHT RETROGRADE/ HOLMIUM LASER  AND RIGHT STENT PLACEMENT FIRST STAGE;  Surgeon: Irine Seal, MD;  Location: [facility];  Service: Urology;  Laterality: Right;  . CYSTOSCOPY/URETEROSCOPY/HOLMIUM LASER/STENT PLACEMENT Right 08/08/2017   Procedure: RIGHT URETEROSCOPY WITH HOLMIUM LASER RIGHT STENT PLACEMENT;  Surgeon: Irine Seal, MD;  Location: [facility];  Service: Urology;  Laterality: Right;  . EXTRACORPOREAL  SHOCK WAVE LITHOTRIPSY Right 08/22/2017   Procedure: RIGHT EXTRACORPOREAL SHOCK WAVE LITHOTRIPSY (ESWL);  Surgeon: Alexis Frock, MD;  Location: [facility];  Service: Urology;  Laterality: Right;  . HEMORRHOID BANDING  07/2012   Dr. Oneida Alar  . LAPAROSCOPIC CHOLECYSTECTOMY  1985  . LAPAROSCOPIC GASTRIC BANDING  12/12/2010  . LEFT AND RIGHT HEART CATHETERIZATION WITH CORONARY ANGIOGRAM N/A 06/03/2012   Procedure: LEFT AND RIGHT HEART CATHETERIZATION WITH CORONARY ANGIOGRAM;  Surgeon: Jolaine Artist, MD;  Location: [facility] CATH LAB;  Service: Cardiovascular;  Laterality: N/A;  . PILONIDAL CYST EXCISION  1974  . RIGHT/LEFT HEART CATH AND CORONARY ANGIOGRAPHY N/A 03/28/2020   Procedure: RIGHT/LEFT HEART CATH AND CORONARY ANGIOGRAPHY;  Surgeon: Jolaine Artist, MD;  Location: [facility] CV LAB;  Service: Cardiovascular;  Laterality: N/A;  . 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 TUMOR EXCISION  10/2011   Face     There were no vitals filed for this visit.  Subjective Assessment - 05/20/20 1406    Subjective  States that her pain is still the same in her left leg. States she gets relief but she can't seem to pinpoint what exercise helps her to get relief there. States that last night her hamstrings she worked it bit. States she is getting tone and feels like she is getting more control and she is eating less. States she has a lot of exercises and she just picks and chooses what she can do but she likes that she doesn't get bored.    Currently in Pain?  Yes    Pain Score  8     Pain Location  Knee    Pain Orientation  Left    Pain Descriptors / Indicators  Aching;Sore         [facility] PT Assessment - 05/20/20 0001      Assessment   Medical Diagnosis  Chronic Pain, LBP, Weakness    Referring Provider (PT)  Alger Simons MD    Onset Date/Surgical Date  05/03/12    Next MD Visit  june 2nd 2021                    [facility] Adult PT Treatment/Exercise - 05/20/20 0001      Lumbar Exercises: Seated   Long Arc Quad on Chair  AROM;Left;10 reps;3 sets   pain - stopped---> able to return after supine exercise   Sit to Stand  20 reps    Other Seated Lumbar Exercises  self mobilization with rolling stick      Lumbar Exercises: Supine   Heel Slides  10 reps   left   Straight Leg Raise  15 reps   left      Manual Therapy   Manual Therapy  Soft tissue mobilization;Joint mobilization    Manual therapy comments  all manual interventins performed independently of other interventions    Joint Mobilization  L inferior patella mobilization - seated    Soft tissue mobilization  STM and TPR to left distal quad, adductors and hamstrings  - seated             PT Education - 05/20/20 1423    Education Details  educated patient in self mobilization and how it can help with muscle tension/pain. Educated patient on elevation and how it will help with left LE swelling.    Person(s) Educated   Patient    Methods  Explanation    Comprehension  Verbalized understanding  PT Short Term Goals - 05/03/20 1616      PT SHORT TERM GOAL #1   Title  Patient will be independent with HEP in order to improve functional outcomes.    Time  2    Period  Weeks    Status  New    Target Date  05/17/20      PT SHORT TERM GOAL #2   Title  Patient will report at least 25% improvement in symptoms for improved quality of life.    Time  2    Period  Weeks    Status  New    Target Date  05/17/20      PT SHORT TERM GOAL #3   Title  Pt to be able to complete 10 mintues of light housework with her back pain not increasing past a 7/10.    Time  2    Period  Weeks    Status  New    Target Date  05/17/20        PT Long Term Goals - 05/03/20 1617      PT LONG TERM GOAL #1   Title  Patient will report at least 75% improvement in symptoms for improved quality of life.    Time  4    Period  Weeks    Status  New    Target Date  05/31/20      PT LONG TERM GOAL #2   Title  Patient will demonstrate at least 25% improvement in lumbar ROM in all planes for improved ability to move while at home.    Time  4    Period  Weeks    Status  New    Target Date  05/31/20      PT LONG TERM GOAL #3   Title  Patient will be able to ambulate at least 1/4 mile without back pain greater than 8/10 for improved community ambulation.    Time  4    Period  Weeks    Status  New    Target Date  05/31/20            Plan - 05/20/20 1359    Clinical Impression Statement  Focused on left knee today secondary to patient complaints and swelling. Educated patient on benefit of self mobilization, elevation and compression stockings. Patient initially did not tolerate LAQ or Thomas stretch position but did tolerate LAQ after manual work. Improved gait speed noted end of session. Will follow up with compliance of elevation next session.    Personal Factors and Comorbidities  Comorbidity  3+;Education;Past/Current Experience;Time since onset of injury/illness/exacerbation    Comorbidities  chronic pain, lumbar radic bilateral, chronic low back pain, Fibromyalgia, obesity, CHF, diabetes    Examination-Activity Limitations  Bed Mobility;Bend;Carry;Locomotion Level;Sit;Squat;Stairs;Stand;Transfers    Examination-Participation Restrictions  Cleaning;Shop;Volunteer;Yard Work;Meal Prep    Stability/Clinical Decision Making  Evolving/Moderate complexity    Rehab Potential  Fair    PT Frequency  2x / week    PT Duration  4 weeks    PT Treatment/Interventions  ADLs/Self Care Home Management;Aquatic Therapy;Biofeedback;Electrical Stimulation;Iontophoresis 4mg /ml Dexamethasone;Moist Heat;Traction;Ultrasound;Parrafin;Fluidtherapy;Cryotherapy;Contrast Bath;DME Instruction;Gait training;Neuromuscular re-education;Stair training;Functional mobility training;Therapeutic activities;Therapeutic exercise;Balance training;Patient/family education;Orthotic Fit/Training;Manual techniques;Manual lymph drainage;Compression bandaging;Scar mobilization;Passive range of motion;Dry needling;Energy conservation;Splinting;Taping;Vasopneumatic Device;Joint Manipulations;Spinal Manipulations    PT Next Visit Plan  Progress LE strengtheing as tolerated and balance/gait training    PT Home Exercise Plan  05/04/20: ab set, ab march, glute set, bridge, LTR; 5/11: STS and clam.; 5/21 self mobilization with rolling  stick    Consulted and Agree with Plan of Care  Patient       Patient will benefit from skilled therapeutic intervention in order to improve the following deficits and impairments:  Abnormal gait, Decreased activity tolerance, Decreased balance, Decreased mobility, Decreased range of motion, Decreased endurance, Decreased strength, Difficulty walking, Impaired flexibility, Postural dysfunction, Pain, Obesity  Visit Diagnosis: Low back pain, unspecified back pain laterality, unspecified chronicity, unspecified  whether sciatica present  Muscle weakness (generalized)  Other abnormalities of gait and mobility  Other symptoms and signs involving the musculoskeletal system     Problem List Patient Active Problem List   Diagnosis Date Noted  . Fibromyalgia 04/20/2020  . Routine cervical smear 04/18/2020  . Encounter for gynecological examination with Papanicolaou smear of cervix 04/18/2020  . Encounter for colorectal cancer screening 04/18/2020  . Chronic female pelvic pain 04/18/2020  . Proteinuria 04/18/2020  . Bad odor of urine 04/18/2020  . Klebsiella pneumonia ([place]) 01/30/2019  . E. coli UTI (urinary tract infection) 01/30/2019  . Complicated UTI (urinary tract infection) 01/29/2019  . Levoscoliosis 11/20/2017  . Staghorn kidney stones 08/08/2017  . GI bleed 05/29/2017  . Sepsis due to urinary tract infection ([place]) 05/29/2017  . UTI (urinary tract infection) 05/29/2017  . Cervicalgia 11/21/2016  . Chronic pain syndrome 10/11/2015  . Lumbar radiculopathy 07/25/2015  . Fibroid 10/13/2013  . Adnexal cyst 10/13/2013  . PMB (postmenopausal bleeding) 10/06/2013  . Unspecified symptom associated with female genital organs 10/06/2013  . Hot flashes 10/06/2013  . Internal hemorrhoids with other complication 02/28/3142  . 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 Hip pain 04/09/2013  . Hemorrhoids, internal 03/12/2013  . Back pain 03/11/2013  . Encounter for therapeutic drug monitoring 03/11/2013  . Trochanteric bursitis 03/11/2013  . Lumbar facet arthropathy 03/11/2013  . Unstable angina ([place]) 06/02/2012  . Acute on chronic renal insufficiency 06/02/2012  . Chest pain 04/22/2012  . IBS (irritable bowel syndrome)-DIARRHEA 01/23/2012  . Morbid obesity ([place]) 08/09/2011  . Diabetes mellitus ([place]) 08/09/2011  . DIARRHEA 10/11/2010  . Chronic diastolic heart failure ([place]) 12/16/2007  . Pulmonary hypertension ([place]) 12/12/2007  . OSTEOARTHRITIS 12/11/2007  . SLEEP APNEA 12/11/2007    2:42 PM, 05/20/20 Jerene Pitch, DPT Physical Therapy with [facility]  [phone] office   [place] [address] [place], [place], [address] Phone: [phone]   Fax:  [phone]  Name: Sara Tran MRN: [id number] Date of Birth: 24-Feb-1956

## 2020-05-23 ENCOUNTER — Telehealth (HOSPITAL_COMMUNITY): Payer: Self-pay | Admitting: Physical Therapy

## 2020-05-23 ENCOUNTER — Ambulatory Visit (HOSPITAL_COMMUNITY): Payer: Medicare HMO | Admitting: Physical Therapy

## 2020-05-23 NOTE — Telephone Encounter (Signed)
pt cancelled appt for today because she said she was just not up for it.

## 2020-05-25 ENCOUNTER — Telehealth (HOSPITAL_COMMUNITY): Payer: Self-pay | Admitting: Physical Therapy

## 2020-05-25 ENCOUNTER — Ambulatory Visit (HOSPITAL_COMMUNITY): Payer: Medicare HMO | Admitting: Physical Therapy

## 2020-05-25 NOTE — Telephone Encounter (Signed)
pt called to cx her appt for today due to she stated that something was wrong with her eye.

## 2020-05-26 DIAGNOSIS — H1130 Conjunctival hemorrhage, unspecified eye: Secondary | ICD-10-CM | POA: Diagnosis not present

## 2020-05-26 DIAGNOSIS — I10 Essential (primary) hypertension: Secondary | ICD-10-CM | POA: Diagnosis not present

## 2020-05-26 DIAGNOSIS — Z681 Body mass index (BMI) 19 or less, adult: Secondary | ICD-10-CM | POA: Diagnosis not present

## 2020-05-31 ENCOUNTER — Other Ambulatory Visit: Payer: Self-pay

## 2020-05-31 ENCOUNTER — Ambulatory Visit (HOSPITAL_COMMUNITY): Payer: Medicare HMO | Attending: Physical Medicine & Rehabilitation | Admitting: Physical Therapy

## 2020-05-31 DIAGNOSIS — M6281 Muscle weakness (generalized): Secondary | ICD-10-CM | POA: Diagnosis not present

## 2020-05-31 DIAGNOSIS — M545 Low back pain, unspecified: Secondary | ICD-10-CM

## 2020-05-31 DIAGNOSIS — R29898 Other symptoms and signs involving the musculoskeletal system: Secondary | ICD-10-CM

## 2020-05-31 DIAGNOSIS — R2689 Other abnormalities of gait and mobility: Secondary | ICD-10-CM | POA: Diagnosis not present

## 2020-05-31 NOTE — Therapy (Signed)
[facility] [address] [place], [place], [address] Phone: [phone]   Fax:  [phone]  Physical Therapy Treatment  Patient Details  Name: Sara Tran MRN: [id number] Date of Birth: Dec 15, 1956 Referring Provider (PT): Alger Simons MD  PHYSICAL THERAPY DISCHARGE SUMMARY  Visits from Start of Care: 7  Current functional level related to goals / functional outcomes: See below    Remaining deficits: Strength and endurance    Education / Equipment: HEP  Plan: Patient agrees to discharge.  Patient goals were partially met. Patient is being discharged due to being pleased with the current functional level.  ?????      Encounter Date: 05/31/2020  PT End of Session - 05/31/20 1451    Number of Visits  7    Date for PT Re-Evaluation  05/31/20    Authorization Type  [employer]; FOTO was not initiated    Authorization Time Period  05/01/20-05/31/20    PT Start Time  1410    PT Stop Time  1448    PT Time Calculation (min)  38 min    Activity Tolerance  Patient limited by fatigue    Behavior During Therapy  [facility] for tasks assessed/performed       Past Medical History:  Diagnosis Date  . Adnexal cyst    Right simple cyst seen on [place]  . Anginal pain ([place])   . Anxiety   . Arthritis   . Asthma   . Atrial fibrillation (HCC)    hx of   . CHF (congestive heart failure) ([place])   . Chronic back pain   . Chronic hip pain   . COPD (chronic obstructive pulmonary disease) ([place])   . Coronary artery disease    stents placed   . Dyspnea   . GERD (gastroesophageal reflux disease)   . Headache    due to pinched nerve damaage   . Heart murmur    hx of slight murmur   . History of bronchitis   . History of cardiac catheterization    Normal coronaries 2013  . History of kidney stones   . Hot flashes   . Hypertension   . Hypertriglyceridemia   . IBS (irritable bowel syndrome)   . Internal hemorrhoids   . Morbid obesity ([place])    s/p lap band surgery  . Myocardial infarction ([place])    times 3  . Obesity   . Pulmonary HTN ([place])    [place] 2008:  RA pressures 13/10 with a mean of 7 mmHg.  RV pressure 83/38 with end diastolic pressure of 15 mmHg.  PA pressure 49/23 with a mean of 35 mmHg.  Pulmonary capillary wedge 17/50 with a mean of 14 mmHg. The PA saturation was 68%.  RA saturation was 71% and aortic saturation was 90%.  Cardiac output was 6.0 with a cardiac index of 2.80 by F[place].  Normal coronaries by cath 2008.  [place] Kitchen Renal insufficiency   . Sciatica of right side   . Sleep apnea    CPAP use does not know settings   . Trauma   . Type 2 diabetes mellitus ([place])    type II   . Uterine fibroid     Past Surgical History:  Procedure Laterality Date  . APPENDECTOMY    . CARDIAC CATHETERIZATION  07/2007, 05/2012   Normal coronary arteries  . COLONOSCOPY  10/18/10   SLF:2-mm sessile sigmoid polyp/diverticula  . CYSTOSCOPY/URETEROSCOPY/HOLMIUM LASER/STENT PLACEMENT Right 08/01/2017   Procedure: CYSTOSCOPY/ RIGHT  URETEROSCOPY/ RIGHT RETROGRADE/ HOLMIUM LASER  AND RIGHT STENT PLACEMENT FIRST STAGE;  Surgeon: Irine Seal, MD;  Location: [facility];  Service: Urology;  Laterality: Right;  . CYSTOSCOPY/URETEROSCOPY/HOLMIUM LASER/STENT PLACEMENT Right 08/08/2017   Procedure: RIGHT URETEROSCOPY WITH HOLMIUM LASER RIGHT STENT PLACEMENT;  Surgeon: Irine Seal, MD;  Location: [facility];  Service: Urology;  Laterality: Right;  . EXTRACORPOREAL SHOCK WAVE LITHOTRIPSY Right 08/22/2017   Procedure: RIGHT EXTRACORPOREAL SHOCK WAVE LITHOTRIPSY (ESWL);  Surgeon: Alexis Frock, MD;  Location: [facility];  Service: Urology;  Laterality: Right;  . HEMORRHOID BANDING  07/2012   Dr. Oneida Alar  . LAPAROSCOPIC CHOLECYSTECTOMY  1985  . LAPAROSCOPIC GASTRIC BANDING  12/12/2010  . LEFT AND RIGHT HEART CATHETERIZATION WITH CORONARY ANGIOGRAM N/A 06/03/2012   Procedure: LEFT AND RIGHT HEART CATHETERIZATION WITH CORONARY ANGIOGRAM;  Surgeon: Jolaine Artist, MD;   Location: [facility] CATH LAB;  Service: Cardiovascular;  Laterality: N/A;  . PILONIDAL CYST EXCISION  1974  . RIGHT/LEFT HEART CATH AND CORONARY ANGIOGRAPHY N/A 03/28/2020   Procedure: RIGHT/LEFT HEART CATH AND CORONARY ANGIOGRAPHY;  Surgeon: Jolaine Artist, MD;  Location: [facility] CV LAB;  Service: Cardiovascular;  Laterality: N/A;  . 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 TUMOR EXCISION  10/2011   Face    There were no vitals filed for this visit.  Subjective Assessment - 05/31/20 1408    Subjective  PT states that she feels that therapy has improved 100%    Pertinent History  chronic pain, lumbar radic bilateral, chronic low back pain, Fibromyalgia, obesity, CHF    Limitations  Sitting;Lifting;Standing;Walking;House hold activities    How long can you stand comfortably?  able to stand for about 20 minutes with pain but was 30 seconds    How long can you walk comfortably?  Able to walk for 1/8th mile    Patient Stated Goals  walk better    Currently in Pain?  Yes    Pain Score  3     Pain Location  Back    Pain Orientation  Left    Pain Descriptors / Indicators  Aching    Pain Type  Chronic pain    Pain Radiating Towards  more on the Right ;    Pain Onset  More than a month ago    Pain Frequency  Constant    Aggravating Factors   motion    Pain Relieving Factors  medication    Effect of Pain on Daily Activities  limits         [facility] PT Assessment - 05/31/20 0001      Assessment   Medical Diagnosis  Chronic Pain, LBP, Weakness    Referring Provider (PT)  Alger Simons MD    Onset Date/Surgical Date  05/03/12    Next MD Visit  June    Prior Therapy  Yes      Precautions   Precautions  None      Restrictions   Weight Bearing Restrictions  No      Prior Function   Level of Independence  Independent    Vocation  Retired    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, quantity, 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      Cognition   Overall Cognitive Status  Within Functional Limits  for tasks assessed      Observation/Other Assessments   Observations  Ambulates with slow, labored cadence    Focus on Therapeutic Outcomes (FOTO)   not completed  AROM   Overall AROM   Deficits;Due to pain    Overall AROM Comments  c/o low back pain or L knee pain with AROM of low back and movement of knee    Lumbar Flexion  10% limited "feels good"   was 25% limited    Lumbar Extension  50% limited    continues to be limited 50%    Lumbar - Right Side Bend  25% limited    was 50% limited   Lumbar - Left Side Bend  30% limited    was 50% limited   Lumbar - Right Rotation  10 % limited    was 50% limited   Lumbar - Left Rotation  20% limited   was 50% limited     Strength   Overall Strength  Deficits    Right Hip Flexion  3+/5   was 3+   Left Hip Flexion  3+/5   was 3+   Right Knee Flexion  3+/5   was 3+   Right Knee Extension  4+/5   was 3+   Left Knee Flexion  3+/5   was  3+   Left Knee Extension  3+/5   was 3+ demonstrates a give way to resistance    Right Ankle Dorsiflexion  5/5   was 5/5   Left Ankle Dorsiflexion  5/5   was 5/5     Palpation   Palpation comment  unable to assess due to time constraints      Transfers   Comments  slow, labored requires use of hands      Ambulation/Gait   Ambulation/Gait  Yes    Ambulation/Gait Assistance  6: Modified independent (Device/Increase time)    Ambulation Distance (Feet)  226 Feet    Assistive device  None    Gait Pattern  Decreased hip/knee flexion - right;Decreased hip/knee flexion - left;Poor foot clearance - left;Poor foot clearance - right    Gait velocity  decreased    Gait Comments  2:36 unable to recover by 3:00                    [facility] Adult PT Treatment/Exercise - 05/31/20 0001      Exercises   Exercises  Lumbar      Lumbar Exercises: Aerobic   Nustep  x10mnutes      Lumbar Exercises: Standing   Heel Raises  10 reps    Functional Squats  5 reps    Other Standing Lumbar  Exercises  marching      Lumbar Exercises: Supine   Dead Bug  10 reps    Bridge  10 reps;5 seconds    Straight Leg Raise  10 reps             PT Education - 05/31/20 1450    Education Details  HEP       PT Short Term Goals - 05/31/20 1428      PT SHORT TERM GOAL #1   Title  Patient will be independent with HEP in order to improve functional outcomes.    Time  2    Period  Weeks    Status  Achieved    Target Date  05/17/20      PT SHORT TERM GOAL #2   Title  Patient will report at least 25% improvement in symptoms for improved quality of life.    Time  2    Period  Weeks    Status  Achieved  Target Date  05/17/20      PT SHORT TERM GOAL #3   Title  Pt to be able to complete 10 mintues of light housework with her back pain not increasing past a 7/10.    Time  2    Period  Weeks    Status  Achieved    Target Date  05/17/20      PT SHORT TERM GOAL #4   Status  Achieved        PT Long Term Goals - 05/31/20 1430      PT LONG TERM GOAL #1   Title  Patient will report at least 75% improvement in symptoms for improved quality of life.    Time  4    Period  Weeks    Status  Achieved      PT LONG TERM GOAL #2   Title  Patient will demonstrate at least 25% improvement in lumbar ROM in all planes for improved ability to move while at home.    Time  4    Period  Weeks    Status  Partially Met      PT LONG TERM GOAL #3   Title  Patient will be able to ambulate at least 1/4 mile without back pain greater than 8/10 for improved community ambulation.    Time  4    Period  Weeks    Status  On-going      PT LONG TERM GOAL #4   Status  Deferred      PT LONG TERM GOAL #5   Title  Patient's back and hip ROM to improve to allow patient to bend and pick an item off the floor with ease.     Status  Achieved            Plan - 05/31/20 1440    Clinical Impression Statement  Pt reassessed today.  PT has improved in ROM, pain and functional ability although she  continues to have significant limitations.  PT feels that she can continue on her own at home as this is her third time in therapy.  Pt was encouraged to increase her walking.    Personal Factors and Comorbidities  Comorbidity 3+;Education;Past/Current Experience;Time since onset of injury/illness/exacerbation    Comorbidities  chronic pain, lumbar radic bilateral, chronic low back pain, Fibromyalgia, obesity, CHF, diabetes    Examination-Activity Limitations  Bed Mobility;Bend;Carry;Locomotion Level;Sit;Squat;Stairs;Stand;Transfers    Examination-Participation Restrictions  Cleaning;Shop;Volunteer;Yard Work;Meal Prep    Stability/Clinical Decision Making  Evolving/Moderate complexity    Rehab Potential  Fair    PT Frequency  2x / week    PT Duration  4 weeks    PT Treatment/Interventions  ADLs/Self Care Home Management;Aquatic Therapy;Biofeedback;Electrical Stimulation;Iontophoresis '4mg'$ /ml Dexamethasone;Moist Heat;Traction;Ultrasound;Parrafin;Fluidtherapy;Cryotherapy;Contrast Bath;DME Instruction;Gait training;Neuromuscular re-education;Stair training;Functional mobility training;Therapeutic activities;Therapeutic exercise;Balance training;Patient/family education;Orthotic Fit/Training;Manual techniques;Manual lymph drainage;Compression bandaging;Scar mobilization;Passive range of motion;Dry needling;Energy conservation;Splinting;Taping;Vasopneumatic Device;Joint Manipulations;Spinal Manipulations    PT Next Visit Plan  Discharge    PT Home Exercise Plan  05/04/20: ab set, ab march, glute set, bridge, LTR; 5/11: STS and clam.; 5/21 self mobilization with rolling stick; 6/1;  Heel raise, functional squat and marching    Consulted and Agree with Plan of Care  Patient       Patient will benefit from skilled therapeutic intervention in order to improve the following deficits and impairments:  Abnormal gait, Decreased activity tolerance, Decreased balance, Decreased mobility, Decreased range of motion,  Decreased endurance, Decreased strength, Difficulty walking, Impaired flexibility, Postural dysfunction, Pain,  Obesity  Visit Diagnosis: Low back pain, unspecified back pain laterality, unspecified chronicity, unspecified whether sciatica present  Muscle weakness (generalized)  Other abnormalities of gait and mobility  Other symptoms and signs involving the musculoskeletal system     Problem List Patient Active Problem List   Diagnosis Date Noted  . Fibromyalgia 04/20/2020  . Routine cervical smear 04/18/2020  . Encounter for gynecological examination with Papanicolaou smear of cervix 04/18/2020  . Encounter for colorectal cancer screening 04/18/2020  . Chronic female pelvic pain 04/18/2020  . Proteinuria 04/18/2020  . Bad odor of urine 04/18/2020  . Klebsiella pneumonia ([place]) 01/30/2019  . E. coli UTI (urinary tract infection) 01/30/2019  . Complicated UTI (urinary tract infection) 01/29/2019  . Levoscoliosis 11/20/2017  . [place] kidney stones 08/08/2017  . GI bleed 05/29/2017  . Sepsis due to urinary tract infection ([place]) 05/29/2017  . UTI (urinary tract infection) 05/29/2017  . Cervicalgia 11/21/2016  . Chronic pain syndrome 10/11/2015  . Lumbar radiculopathy 07/25/2015  . Fibroid 10/13/2013  . Adnexal cyst 10/13/2013  . PMB (postmenopausal bleeding) 10/06/2013  . Unspecified symptom associated with female genital organs 10/06/2013  . Hot flashes 10/06/2013  . Internal hemorrhoids with other complication 91/91/6606  . 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 Hip pain 04/09/2013  . Hemorrhoids, internal 03/12/2013  . Back pain 03/11/2013  . Encounter for therapeutic drug monitoring 03/11/2013  . Trochanteric bursitis 03/11/2013  . Lumbar facet arthropathy 03/11/2013  . Unstable angina ([place]) 06/02/2012  . Acute on chronic renal insufficiency 06/02/2012  . Chest pain 04/22/2012  . IBS (irritable bowel syndrome)-DIARRHEA 01/23/2012  . Morbid obesity ([place])  08/09/2011  . Diabetes mellitus ([place]) 08/09/2011  . DIARRHEA 10/11/2010  . Chronic diastolic heart failure ([place]) 12/16/2007  . Pulmonary hypertension ([place]) 12/12/2007  . OSTEOARTHRITIS 12/11/2007  . SLEEP APNEA 12/11/2007    Rayetta Humphrey, PT CLT [phone] 05/31/2020, 2:53 PM  [facility] [address] [place], [place], [address] Phone: [phone]   Fax:  [phone]  Name: DENNYS TRAUGHBER MRN: [id number] Date of Birth: 06-26-1956

## 2020-06-01 ENCOUNTER — Other Ambulatory Visit: Payer: Self-pay

## 2020-06-01 ENCOUNTER — Encounter: Payer: Self-pay | Admitting: Physical Medicine & Rehabilitation

## 2020-06-01 ENCOUNTER — Encounter: Payer: Medicare HMO | Attending: Physical Medicine & Rehabilitation | Admitting: Physical Medicine & Rehabilitation

## 2020-06-01 VITALS — BP 180/68 | HR 89 | Ht 60.0 in | Wt 283.0 lb

## 2020-06-01 DIAGNOSIS — M5416 Radiculopathy, lumbar region: Secondary | ICD-10-CM | POA: Diagnosis not present

## 2020-06-01 DIAGNOSIS — M797 Fibromyalgia: Secondary | ICD-10-CM | POA: Diagnosis not present

## 2020-06-01 DIAGNOSIS — M47816 Spondylosis without myelopathy or radiculopathy, lumbar region: Secondary | ICD-10-CM | POA: Insufficient documentation

## 2020-06-01 DIAGNOSIS — K581 Irritable bowel syndrome with constipation: Secondary | ICD-10-CM | POA: Insufficient documentation

## 2020-06-01 DIAGNOSIS — G894 Chronic pain syndrome: Secondary | ICD-10-CM | POA: Diagnosis not present

## 2020-06-01 DIAGNOSIS — M4180 Other forms of scoliosis, site unspecified: Secondary | ICD-10-CM | POA: Diagnosis not present

## 2020-06-01 NOTE — Progress Notes (Signed)
Subjective:    Patient ID: Sara Tran, female    DOB: [birth year], 64 y.o.   MRN: [id number]  HPI   Darris is here in follow up of her pain issues. She completed outpt physical therapy yesterday. She made nice progress and has increased her walking distance and now walk 1/8 mile with her walker! She is working on a HEP.  They did some work on her knee/quads which helped her knee pain.   Her BP has been elevated. Her PCP put her back on losartan and norvasc to help control. The losartan was started last week.   We reviewed her lumbar x-rays which were performed last month.  Results were as follows: Mild levocurvature. Unchanged mild anterolisthesis at L3-L4 and L4-L5. Unchanged mild to moderate disc height loss at L3-L4 and L4-L5. Unchanged severe right greater than left facet arthropathy from L3-L4 through L5-S1.  Prominent degenerative sclerosis of the right sacroiliac joint. Calcified fibroids in pelvis again noted. Gastric lap band noted.  Alicea has interest in starting back to acupuncture which she found helpful for pain control as well as range of motion.    Pain Inventory Average Pain 7 Pain Right Now 7 My pain is constant  In the last 24 hours, has pain interfered with the following? General activity 7 Relation with others 7 Enjoyment of life 7 What TIME of day is your pain at its worst? morning, night  Sleep (in general) Poor  Pain is worse with: walking, bending, sitting and standing Pain improves with: rest, therapy/exercise, medication and injections Relief from Meds: none  Mobility walk without assistance walk with assistance use a cane use a walker ability to climb steps?  no do you drive?  yes  Function Do you have any goals in this area?  no  Neuro/Psych trouble walking  Prior Studies Any changes since last visit?  no  Physicians involved in your care Any changes since last visit?  no   Family History  Problem Relation Age of Onset  .  Diabetes Mother   . Heart failure Mother   . Breast cancer Sister   . CAD Father   . Hypertension Father   . Heart failure Father   . Cancer Paternal Grandfather   . Hypertension Paternal Grandmother   . Stroke Paternal Grandmother   . Other Maternal Grandmother        tonsilitis  . Diabetes Maternal Grandfather   . Hypertension Maternal Grandfather   . Breast cancer Paternal Aunt    Social History   Socioeconomic History  . Marital status: Single    Spouse name: Not on file  . Number of children: 0  . Years of education: Not on file  . Highest education level: Not on file  Occupational History  . Occupation: Volunteers  . 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 Smoking status: Former Smoker    Types: Cigarettes    Quit date: 08/08/1996    Years since quitting: 23.8  . Smokeless tobacco: Never Used  Substance and Sexual Activity  . Alcohol use: No  . Drug use: No  . Sexual activity: Not Currently    Birth control/protection: Post-menopausal  Other Topics Concern  . Not on file  Social History Narrative  . Not on file   Social Determinants of Health   Financial Resource Strain: Unknown  . Difficulty of Paying Living Expenses: Patient refused  Food Insecurity: Food Insecurity Present  .  Worried About Charity fundraiser in the Last Year: Often true  . Ran Out of Food in the Last Year: Sometimes true  Transportation Needs: Unknown  . Lack of Transportation (Medical): Patient refused  . Lack of Transportation (Non-Medical): Patient refused  Physical Activity: Insufficiently Active  . Days of Exercise per Week: 1 day  . Minutes of Exercise per Session: 10 min  Stress: Unknown  . Feeling of Stress : Patient refused  Social Connections: Unknown  . Frequency of Communication with Friends and Family: More than three times a week  . Frequency of Social Gatherings with Friends and Family: Patient refused  . Attends  Religious Services: Patient refused  . Active Member of Clubs or Organizations: Yes  . Attends Archivist Meetings: Patient refused  . Marital Status: Patient refused   Past Surgical History:  Procedure Laterality Date  . APPENDECTOMY    . CARDIAC CATHETERIZATION  07/2007, 05/2012   Normal coronary arteries  . COLONOSCOPY  10/18/10   SLF:2-mm sessile sigmoid polyp/diverticula  . CYSTOSCOPY/URETEROSCOPY/HOLMIUM LASER/STENT PLACEMENT Right 08/01/2017   Procedure: CYSTOSCOPY/ RIGHT URETEROSCOPY/ RIGHT RETROGRADE/ HOLMIUM LASER  AND RIGHT STENT PLACEMENT FIRST STAGE;  Surgeon: Irine Seal, MD;  Location: [facility];  Service: Urology;  Laterality: Right;  . CYSTOSCOPY/URETEROSCOPY/HOLMIUM LASER/STENT PLACEMENT Right 08/08/2017   Procedure: RIGHT URETEROSCOPY WITH HOLMIUM LASER RIGHT STENT PLACEMENT;  Surgeon: Irine Seal, MD;  Location: [facility];  Service: Urology;  Laterality: Right;  . EXTRACORPOREAL SHOCK WAVE LITHOTRIPSY Right 08/22/2017   Procedure: RIGHT EXTRACORPOREAL SHOCK WAVE LITHOTRIPSY (ESWL);  Surgeon: Alexis Frock, MD;  Location: [facility];  Service: Urology;  Laterality: Right;  . HEMORRHOID BANDING  07/2012   Dr. Oneida Alar  . LAPAROSCOPIC CHOLECYSTECTOMY  1985  . LAPAROSCOPIC GASTRIC BANDING  12/12/2010  . LEFT AND RIGHT HEART CATHETERIZATION WITH CORONARY ANGIOGRAM N/A 06/03/2012   Procedure: LEFT AND RIGHT HEART CATHETERIZATION WITH CORONARY ANGIOGRAM;  Surgeon: Jolaine Artist, MD;  Location[facility] CATH LAB;  Service: Cardiovascular;  Laterality: N/A;  . PILONIDAL CYST EXCISION  1974  . RIGHT/LEFT HEART CATH AND CORONARY ANGIOGRAPHY N/A 03/28/2020   Procedure: RIGHT/LEFT HEART CATH AND CORONARY ANGIOGRAPHY;  Surgeon: Jolaine Artist, MD;  Location: [place] CV LAB;  Service: Cardiovascular;  Laterality: N/A;  . 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 TUMOR EXCISION  10/2011   Face   Past Medical History:  Diagnosis Date  . Adnexal cyst    Right simple cyst seen on [place]    . Anginal pain ([place])   . Anxiety   . Arthritis   . Asthma   . Atrial fibrillation (HCC)    hx of   . CHF (congestive heart failure) ([place])   . Chronic back pain   . Chronic hip pain   . COPD (chronic obstructive pulmonary disease) ([place])   . Coronary artery disease    stents placed   . Dyspnea   . GERD (gastroesophageal reflux disease)   . Headache    due to pinched nerve damaage   . Heart murmur    hx of slight murmur   . History of bronchitis   . History of cardiac catheterization    Normal coronaries 2013  . History of kidney stones   . Hot flashes   . Hypertension   . Hypertriglyceridemia   . IBS (irritable bowel syndrome)   . Internal hemorrhoids   . Morbid obesity ([place])    s/p lap band surgery  . Myocardial  infarction ([place])    times 3  . Obesity   . Pulmonary HTN ([place])    [place] 2008:  RA pressures 13/10 with a mean of 7 mmHg.  RV pressure 76/16 with end diastolic pressure of 15 mmHg.  PA pressure 49/23 with a mean of 35 mmHg.  Pulmonary capillary wedge 17/50 with a mean of 14 mmHg. The PA saturation was 68%.  RA saturation was 71% and aortic saturation was 90%.  Cardiac output was 6.0 with a cardiac index of 2.80 by Fick.  Normal coronaries by cath 2008.  Marland Kitchen Renal insufficiency   . Sciatica of right side   . Sleep apnea    CPAP use does not know settings   . Trauma   . Type 2 diabetes mellitus ([place])    type II   . Uterine fibroid    LMP 06/06/2013   Opioid Risk Score:   [place] Risk Score:  `1  Depression screen PHQ 2/9  Depression screen [facility] 2/9 04/18/2020 11/30/2019 01/17/2016 07/25/2015 07/25/2015  Decreased Interest 0 0 0 0 0  Down, Depressed, Hopeless 0 0 0 0 0  PHQ - 2 Score 0 0 0 0 0  Altered sleeping 0 - - 2 -  Tired, decreased energy 3 - - 2 -  Change in appetite 3 - - 1 -  Feeling bad or failure about yourself  0 - - 0 -  Trouble concentrating 0 - - 0 -  Moving slowly or fidgety/restless 0 - - 0 -  Suicidal thoughts 0 - - 0 -  PHQ-9 Score 6 - - 5 -   Difficult doing work/chores Not difficult at all - - - -  Some recent data might be hidden    Review of Systems  Constitutional: Negative.   HENT: Negative.   Eyes: Negative.   Respiratory: Negative.   Cardiovascular: Negative.   Gastrointestinal: Negative.   Endocrine: Negative.   Genitourinary: Negative.   Musculoskeletal: Positive for arthralgias and gait problem.  Skin: Negative.   Allergic/Immunologic: Negative.   Psychiatric/Behavioral: Negative.   All other systems reviewed and are negative.      Objective:   Physical Exam  General: No acute distress.  Morbidly obese HEENT: EOMI, oral membranes moist Cards: reg rate  Chest: normal effort Abdomen: Soft, NT, ND Skin: dry, intact Extremities: no edema Neuro: Pt is cognitively appropriate with normal insight, memory, and awareness. Cranial nerves 2-12 are intact. Sensory exam is normal. Reflexes are 2+ in all 4's. Fine motor coordination is intact. No tremors. Motor function is grossly 5/5.  Musculoskeletal: LB tender. Pain along right PSIS, mild left knee tenderness with passive and active range of motion today.  Mild crepitus. Psych: Pt's affect is appropriate. Pt is cooperative             Assessment & Plan:  1.  Chronic low back pain with known facet arthropathy and L4-L5 anterolisthesis.  Has had some intermittent radicular signs as well.  Known dextroscoliosis and maladaptive pelvic positioning also. 2.  Bilateral greater trochanteric bursitis with iliotibial band syndrome right greater than left  3.  Morbid obesity.   4.  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  Cervicalgia 6.  History of kidney stones   Plan:  1.  Continue Gabapentin 600mg  TID 2.  continue with weight loss efforts. Happy that she's involved with weight watchers.  She is expecting immediate results.  I cautioned that she needs to be persistent with this plan for her to bear any fruit  3. oxycodone per primary 4. Baclofen 10mg  tid prn may  continue 5.  bowels are moving.  Continue with current regimen 6. Pt is interested in acupuncture. Will check with Dr. Letta Pate to see if he is interested in seeing the patient  7. Lumbar XR reviewed.  Discussed the importance of maintenance exercises for her back as outlined by physical therapy.  Needs to be regular with these.  We also discussed low impact exercise such as a stationary bike and the pool.  She has a membership to [employer] so the opportunities there  Fifteen minutes of face to face patient care time were spent during this visit. All questions were encouraged and answered.  Follow up with me in 3 mos .

## 2020-06-01 NOTE — Patient Instructions (Signed)
PLEASE FEEL FREE TO CALL OUR OFFICE WITH ANY PROBLEMS OR QUESTIONS ([phone])     WORK ON GOOD POSTURE, STAND TALL, WALK TALL!

## 2020-06-02 ENCOUNTER — Encounter (HOSPITAL_COMMUNITY): Payer: Medicare HMO | Admitting: Physical Therapy

## 2020-06-03 ENCOUNTER — Other Ambulatory Visit (HOSPITAL_COMMUNITY)
Admission: RE | Admit: 2020-06-03 | Discharge: 2020-06-03 | Disposition: A | Discharge status: Home / Self Care | Payer: Medicare HMO | Source: Other Acute Inpatient Hospital | Attending: Urology | Admitting: Urology

## 2020-06-03 ENCOUNTER — Ambulatory Visit: Payer: Medicare HMO | Admitting: Urology

## 2020-06-03 ENCOUNTER — Encounter: Payer: Self-pay | Admitting: Urology

## 2020-06-03 ENCOUNTER — Other Ambulatory Visit: Payer: Self-pay

## 2020-06-03 VITALS — BP 127/68 | HR 86 | Temp 94.1°F | Ht 60.0 in | Wt 283.0 lb

## 2020-06-03 DIAGNOSIS — N200 Calculus of kidney: Secondary | ICD-10-CM

## 2020-06-03 DIAGNOSIS — Z87440 Personal history of urinary (tract) infections: Secondary | ICD-10-CM | POA: Insufficient documentation

## 2020-06-03 LAB — URINALYSIS, ROUTINE W REFLEX MICROSCOPIC
Bacteria, UA: NONE SEEN
Bilirubin Urine: NEGATIVE
Glucose, UA: NEGATIVE mg/dL
Ketones, ur: NEGATIVE mg/dL
Nitrite: NEGATIVE
Protein, ur: >=300 mg/dL — AB
Specific Gravity, Urine: 1.023 (ref 1.005–1.030)
Squamous Epithelial / HPF: >50 — ABNORMAL HIGH (ref 0–5)
pH: 7 (ref 5.0–8.0)

## 2020-06-03 LAB — POCT URINALYSIS DIPSTICK
Bilirubin, UA: NEGATIVE
Glucose, UA: NEGATIVE
Ketones, UA: NEGATIVE
Nitrite, UA: NEGATIVE
Protein, UA: POSITIVE — AB
Spec Grav, UA: <=1.005 — AB (ref 1.010–1.025)
Urobilinogen, UA: 0.2 E.U./dL
pH, UA: 8 (ref 5.0–8.0)

## 2020-06-03 NOTE — Progress Notes (Signed)
Urological Symptom Review  Patient is experiencing the following symptoms: Burning/pain with urination Get up at night to urinate Urinary tract infection  Kidney stones Review of Systems  Gastrointestinal (upper)  : Negative for upper GI symptoms  Gastrointestinal (lower) : Constipation  Constitutional : Negative for symptoms  Skin: Negative for skin symptoms  Eyes: Negative for eye symptoms  Ear/Nose/Throat : Sinus problems  Hematologic/Lymphatic: Easy bruising  Cardiovascular : Leg swelling Chest pain  Respiratory : Negative for respiratory symptoms  Endocrine: Negative for endocrine symptoms  Musculoskeletal: Back pain Joint pain  Neurological: Headaches Dizziness  Psychologic: Negative for psychiatric symptoms

## 2020-06-03 NOTE — Progress Notes (Signed)
Subjective:  1. Personal history of urinary infection   2. Renal stones      Elisama returns today in f/u for her history of stones and UTI's. Her UA today has trace heme and 1+ LE.  She has malodorous urine today.  She has no recent UUI and has not been on the oxybutynin.   She has had no gross hematuria or flank pain.  She had microhematuria with her Gyn.   She had abdominal films in April and has a stable 3-21mm RLP stone when compared with a CT from 1/20.  No ureteral stones were seen.      GU Hx: She was treated with ureteroscopy for a right renal stone. She got septic after the last ureteroscopy and her stent came out the day after surgery. She had a 54mm RLP fragment that I couldn't reach with the ureteroscope so she was treated with ESWL on 08-24-2017. She has passed a few more fragments and her last CT prior to her last visit showed a reduced stone burden with small bilateral stones. the largest being 19mm in the RLP. Her stone was struvite, Ca Ox, Ca Phos and Ammonium acid urate. Her last culture in October grew Klebsiella and she was last treated with bactrim before going on the augmentin. She has had the GI bug prior to this visit.       ROS:  ROS:  A complete review of systems was performed.  All systems are negative except for pertinent findings as noted.   ROS  Allergies  Allergen Reactions  . Ancef [Cefazolin] Anaphylaxis and Shortness Of Breath       . Bee Venom Anaphylaxis    As a child  . Meloxicam     Gi upset   . Ceftin [Cefuroxime Axetil] Swelling    Swollen tongue  . Chicken Meat (Diagnostic) Nausea And Vomiting  . Ciprofloxacin Cough    Coughed up blood  . Macrodantin [Nitrofurantoin] Other (See Comments)    Vomited blood and had dark stool.  . Meat [Alpha-Gal] Nausea And Vomiting  . Milk-Related Compounds Other (See Comments)    IBS  . Nsaids Other (See Comments)    Rectal bleeding   . Other     Snake venom - not sure of reaction -had problems with feet    . Latex Itching and Rash    Outpatient Encounter Medications as of 06/03/2020  Medication Sig Note  . albuterol (PROVENTIL HFA;VENTOLIN HFA) 108 (90 BASE) MCG/ACT inhaler Inhale 2 puffs into the lungs every 6 (six) hours as needed for wheezing or shortness of breath.    Marland Kitchen albuterol (PROVENTIL) (2.5 MG/3ML) 0.083% nebulizer solution Take 2.5 mg by nebulization every 4 (four) hours as needed for wheezing or shortness of breath.    . ALPRAZolam (XANAX) 1 MG tablet Take 0.5 mg by mouth daily as needed for anxiety.    Marland Kitchen amLODipine (NORVASC) 10 MG tablet Take 10 mg by mouth every evening.    . Ascorbic Acid (VITAMIN C) 1000 MG tablet Take 1,000 mg by mouth daily.   . baclofen (LIORESAL) 10 MG tablet Take 1 tablet (10 mg total) by mouth 3 (three) times daily as needed for muscle spasms.   . clobetasol (TEMOVATE) 0.05 % external solution Apply 1 application topically daily as needed (scalp irritation).    Marland Kitchen EPINEPHrine 0.3 mg/0.3 mL IJ SOAJ injection Inject 0.3 mg into the muscle as needed for anaphylaxis.  03/28/2020: Never used as of 03/28/20.  . furosemide (LASIX)  40 MG tablet Take 40 mg by mouth daily.    Marland Kitchen gabapentin (NEURONTIN) 600 MG tablet Take 600 mg by mouth 3 (three) times daily.    . isosorbide mononitrate (IMDUR) 30 MG 24 hr tablet Take 30 mg by mouth daily.    Marland Kitchen loratadine (CLARITIN) 10 MG tablet Take 10 mg by mouth daily.    Marland Kitchen losartan-hydrochlorothiazide (HYZAAR) 100-25 MG tablet Take 1 tablet by mouth daily.   . magnesium oxide (MAG-OX) 400 MG tablet Take 400 mg by mouth 2 (two) times daily.    . nitroGLYCERIN (NITROSTAT) 0.4 MG SL tablet Place 0.4 mg under the tongue every 5 (five) minutes as needed for chest pain.    . nortriptyline (PAMELOR) 10 MG capsule Take 1-2 capsules (10-20 mg total) by mouth at bedtime.   . ondansetron (ZOFRAN) 4 MG tablet Take 4 mg by mouth every 8 (eight) hours as needed for nausea or vomiting.    Marland Kitchen oxyCODONE-acetaminophen (PERCOCET) 10-325 MG tablet Take 1  tablet by mouth 4 (four) times daily as needed for pain.    . potassium chloride SA (K-DUR) 20 MEQ tablet Take 20 mEq by mouth daily.    Marland Kitchen senna-docusate (SENOKOT-S) 8.6-50 MG tablet Take 1 tablet by mouth daily.   . SYMBICORT 160-4.5 MCG/ACT inhaler Take 2 puffs by mouth 2 (two) times daily.   . TRULICITY 1.5 YY/5.0PT SOPN Inject 1.5 mg into the skin every 7 (seven) days.    . DERMA-SMOOTHE/FS SCALP 0.01 % OIL APPLY TO AFFECTED AREASFOF SCALP AT BEDTIME.N   . desonide (DESOWEN) 0.05 % cream APPLY TO AFFECTED AREASOOF EYEBROWS TWICE DAILYAAS NEEDED.   . ST JOHNS WORT PO Take 160 mg by mouth as needed.     No facility-administered encounter medications on file as of 06/03/2020.    Past Medical History:  Diagnosis Date  . Adnexal cyst    Right simple cyst seen on [place]  . Anginal pain ([place])   . Anxiety   . Arthritis   . Asthma   . Atrial fibrillation (HCC)    hx of   . CHF (congestive heart failure) ([place])   . Chronic back pain   . Chronic hip pain   . COPD (chronic obstructive pulmonary disease) ([place])   . Coronary artery disease    stents placed   . Dyspnea   . GERD (gastroesophageal reflux disease)   . Headache    due to pinched nerve damaage   . Heart murmur    hx of slight murmur   . History of bronchitis   . History of cardiac catheterization    Normal coronaries 2013  . History of kidney stones   . Hot flashes   . Hypertension   . Hypertriglyceridemia   . IBS (irritable bowel syndrome)   . Internal hemorrhoids   . Morbid obesity ([place])    s/p lap band surgery  . Myocardial infarction ([place])    times 3  . Obesity   . Pulmonary HTN ([place])    [place] 2008:  RA pressures 13/10 with a mean of 7 mmHg.  RV pressure 46/56 with end diastolic pressure of 15 mmHg.  PA pressure 49/23 with a mean of 35 mmHg.  Pulmonary capillary wedge 17/50 with a mean of 14 mmHg. The PA saturation was 68%.  RA saturation was 71% and aortic saturation was 90%.  Cardiac output was 6.0 with a cardiac index  of 2.80 by Fick.  Normal coronaries by cath 2008.  Marland Kitchen Renal insufficiency   .  Sciatica of right side   . Sleep apnea    CPAP use does not know settings   . Trauma   . Type 2 diabetes mellitus ([place])    type II   . Uterine fibroid     Past Surgical History:  Procedure Laterality Date  . APPENDECTOMY    . CARDIAC CATHETERIZATION  07/2007, 05/2012   Normal coronary arteries  . COLONOSCOPY  10/18/10   SLF:2-mm sessile sigmoid polyp/diverticula  . CYSTOSCOPY/URETEROSCOPY/HOLMIUM LASER/STENT PLACEMENT Right 08/01/2017   Procedure: CYSTOSCOPY/ RIGHT URETEROSCOPY/ RIGHT RETROGRADE/ HOLMIUM LASER  AND RIGHT STENT PLACEMENT FIRST STAGE;  Surgeon: Irine Seal, MD;  [facility];  Service: Urology;  Laterality: Right;  . CYSTOSCOPY/URETEROSCOPY/HOLMIUM LASER/STENT PLACEMENT Right 08/08/2017   Procedure: RIGHT URETEROSCOPY WITH HOLMIUM LASER RIGHT STENT PLACEMENT;  Surgeon: Irine Seal, MD;  [facility];  Service: Urology;  Laterality: Right;  . EXTRACORPOREAL SHOCK WAVE LITHOTRIPSY Right 08/22/2017   Procedure: RIGHT EXTRACORPOREAL SHOCK WAVE LITHOTRIPSY (ESWL);  Surgeon: Alexis Frock, MD;  Location[facility];  Service: Urology;  Laterality: Right;  . HEMORRHOID BANDING  07/2012   Dr. Oneida Alar  . LAPAROSCOPIC CHOLECYSTECTOMY  1985  . LAPAROSCOPIC GASTRIC BANDING  12/12/2010  . LEFT AND RIGHT HEART CATHETERIZATION WITH CORONARY ANGIOGRAM N/A 06/03/2012   Procedure: LEFT AND RIGHT HEART CATHETERIZATION WITH CORONARY ANGIOGRAM;  Surgeon: Jolaine Artist, MD;  [facility] C[facility] LAB;  Service: Cardiovascular;  Laterality: N/A;  . PILONIDAL CYST EXCISION  1974  . RIGHT/LEFT HEART CATH AND CORONARY ANGIOGRAPHY N/A 03/28/2020   Procedure: RIGHT/LEFT HEART CATH AND CORONARY ANGIOGRAPHY;  Surgeon: Jolaine Artist, MD;  Location: [place] CV LAB;  Service: Cardiovascular;  Laterality: N/A;  . 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 TUMOR EXCISION  10/2011   Face    Social History    Socioeconomic History  . Marital status: Single    Spouse name: Not on file  . Number of children: 0  . Years of education: Not on file  . Highest education level: Not on file  Occupational History  . Occupation: Volunteers  . 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 Smoking status: Former Smoker    Types: Cigarettes    Quit date: 08/08/1996    Years since quitting: 23.8  . Smokeless tobacco: Never Used  Substance and Sexual Activity  . Alcohol use: No  . Drug use: No  . Sexual activity: Not Currently    Birth control/protection: Post-menopausal  Other Topics Concern  . Not on file  Social History Narrative  . Not on file   Social Determinants of Health   Financial Resource Strain: Unknown  . Difficulty of Paying Living Expenses: Patient refused  Food Insecurity: Food Insecurity Present  . Worried About Charity fundraiser in the Last Year: Often true  . Ran Out of Food in the Last Year: Sometimes true  Transportation Needs: Unknown  . Lack of Transportation (Medical): Patient refused  . Lack of Transportation (Non-Medical): Patient refused  Physical Activity: Insufficiently Active  . Days of Exercise per Week: 1 day  . Minutes of Exercise per Session: 10 min  Stress: Unknown  . Feeling of Stress : Patient refused  Social Connections: Unknown  . Frequency of Communication with Friends and Family: More than three times a week  . Frequency of Social Gatherings with Friends and Family: Patient refused  . Attends Religious Services: Patient refused  .  Active Member of Clubs or Organizations: Yes  . Attends Archivist Meetings: Patient refused  . Marital Status: Patient refused  Intimate Partner Violence: Unknown  . Fear of Current or Ex-Partner: Patient refused  . Emotionally Abused: Patient refused  . Physically Abused: Patient refused  . Sexually Abused: Patient refused    Family History  Problem Relation  Age of Onset  . Diabetes Mother   . Heart failure Mother   . Breast cancer Sister   . CAD Father   . Hypertension Father   . Heart failure Father   . Cancer Paternal Grandfather   . Hypertension Paternal Grandmother   . Stroke Paternal Grandmother   . Other Maternal Grandmother        tonsilitis  . Diabetes Maternal Grandfather   . Hypertension Maternal Grandfather   . Breast cancer Paternal Aunt        Objective: Vitals:   06/03/20 1149  BP: 127/68  Pulse: 86  Temp: (!) 94.1 F (34.5 C)     Physical Exam Vitals reviewed.  Constitutional:      Appearance: Normal appearance. She is obese.  Neurological:     Mental Status: She is alert.     Lab Results:  Results for orders placed or performed in visit on 06/03/20 (from the past 24 hour(s))  POCT urinalysis dipstick     Status: Abnormal   Collection Time: 06/03/20 11:50 AM  Result Value Ref Range   Color, UA yellow    Clarity, UA     Glucose, UA Negative Negative   Bilirubin, UA neg    Ketones, UA neg    Spec Grav, UA <=1.005 (A) 1.010 - 1.025   Blood, UA trace intact    pH, UA 8.0 5.0 - 8.0   Protein, UA Positive (A) Negative   Urobilinogen, UA 0.2 0.2 or 1.0 E.U./dL   Nitrite, UA neg    Leukocytes, UA Small (1+) (A) Negative   Appearance cloudy    Odor      BMET No results for input(s): NA, K, CL, CO2, GLUCOSE, BUN, CREATININE, CALCIUM in the last 72 hours. PSA No results found for: PSA No results found for: TESTOSTERONE    Studies/Results: KUB from 4/21 reviewed along with the CT from 1/20.      Assessment & Plan: Right renal stone.  The stone is stable and she will return in a year with a KUB.  History of UTI's.  She has LE and trace heme on her UA and I will send for micro and culture.  UUI.  This problem has resolved.    No orders of the defined types were placed in this encounter.    Orders Placed This Encounter  Procedures  . POCT urinalysis dipstick      No follow-ups on  file.   CC: Sharilyn Sites, MD      Irine Seal 06/03/2020

## 2020-06-05 LAB — URINE CULTURE

## 2020-06-06 ENCOUNTER — Encounter (HOSPITAL_COMMUNITY): Payer: Medicare HMO | Admitting: Physical Therapy

## 2020-06-08 ENCOUNTER — Encounter (HOSPITAL_COMMUNITY): Payer: Medicare HMO | Admitting: Physical Therapy

## 2020-06-08 DIAGNOSIS — I10 Essential (primary) hypertension: Secondary | ICD-10-CM | POA: Diagnosis not present

## 2020-06-08 DIAGNOSIS — N183 Chronic kidney disease, stage 3 unspecified: Secondary | ICD-10-CM | POA: Diagnosis not present

## 2020-06-08 DIAGNOSIS — E559 Vitamin D deficiency, unspecified: Secondary | ICD-10-CM | POA: Diagnosis not present

## 2020-06-08 DIAGNOSIS — Z6841 Body Mass Index (BMI) 40.0 and over, adult: Secondary | ICD-10-CM | POA: Diagnosis not present

## 2020-06-08 DIAGNOSIS — J302 Other seasonal allergic rhinitis: Secondary | ICD-10-CM | POA: Diagnosis not present

## 2020-06-08 DIAGNOSIS — E119 Type 2 diabetes mellitus without complications: Secondary | ICD-10-CM | POA: Diagnosis not present

## 2020-06-08 DIAGNOSIS — E7849 Other hyperlipidemia: Secondary | ICD-10-CM | POA: Diagnosis not present

## 2020-06-13 ENCOUNTER — Encounter (HOSPITAL_COMMUNITY): Payer: Medicare HMO | Admitting: Physical Therapy

## 2020-06-13 ENCOUNTER — Other Ambulatory Visit (HOSPITAL_COMMUNITY): Payer: Self-pay | Admitting: *Deleted

## 2020-06-14 ENCOUNTER — Encounter: Payer: Self-pay | Admitting: Physical Medicine & Rehabilitation

## 2020-06-14 ENCOUNTER — Other Ambulatory Visit: Payer: Self-pay

## 2020-06-14 ENCOUNTER — Telehealth: Payer: Self-pay

## 2020-06-14 ENCOUNTER — Encounter: Payer: Medicare HMO | Admitting: Physical Medicine & Rehabilitation

## 2020-06-14 VITALS — BP 163/74 | HR 92 | Temp 96.0°F | Ht 60.0 in | Wt 278.4 lb

## 2020-06-14 DIAGNOSIS — G5601 Carpal tunnel syndrome, right upper limb: Secondary | ICD-10-CM

## 2020-06-14 DIAGNOSIS — G894 Chronic pain syndrome: Secondary | ICD-10-CM | POA: Diagnosis not present

## 2020-06-14 DIAGNOSIS — K581 Irritable bowel syndrome with constipation: Secondary | ICD-10-CM | POA: Diagnosis not present

## 2020-06-14 DIAGNOSIS — M5416 Radiculopathy, lumbar region: Secondary | ICD-10-CM | POA: Diagnosis not present

## 2020-06-14 DIAGNOSIS — M47816 Spondylosis without myelopathy or radiculopathy, lumbar region: Secondary | ICD-10-CM | POA: Diagnosis not present

## 2020-06-14 DIAGNOSIS — M797 Fibromyalgia: Secondary | ICD-10-CM | POA: Diagnosis not present

## 2020-06-14 NOTE — Telephone Encounter (Signed)
Patient asked if she can play golf due to her condition of her back.
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
## 2020-06-14 NOTE — Patient Instructions (Addendum)
Acupuncture treatment for carpal tunnel syndrome was performed today without electrical stimulation. As discussed acupuncture will not regenerate muscle that is lost due to nerve entrapment. Surgical release of nerve entrapment could help with that. Acupuncture can help with pain, usually requires 4 treatments to see whether it is beneficial. If it is effective for pain relief, additional visits may be needed.  We will need to see whether there are insurance benefits for acupuncture and whether a licensed acupuncturist LAc needs to do treatments or MD

## 2020-06-15 ENCOUNTER — Encounter (HOSPITAL_COMMUNITY): Payer: Medicare HMO | Admitting: Physical Therapy

## 2020-06-15 ENCOUNTER — Other Ambulatory Visit (HOSPITAL_COMMUNITY): Payer: Self-pay | Admitting: Internal Medicine

## 2020-06-15 NOTE — Telephone Encounter (Signed)
Honestly, with her back the way it is, it's not a good choice of sports. At the very least, I would not advise that she play golf until her back is in better strength and her core is stronger.  Try things like walking, swmming, working out at gym, etc

## 2020-06-15 NOTE — Telephone Encounter (Signed)
Patient notified. Left detailed voicemail.

## 2020-06-23 DIAGNOSIS — G894 Chronic pain syndrome: Secondary | ICD-10-CM | POA: Diagnosis not present

## 2020-06-24 ENCOUNTER — Encounter: Payer: Medicare HMO | Admitting: Physical Medicine & Rehabilitation

## 2020-06-24 ENCOUNTER — Encounter: Payer: Self-pay | Admitting: Physical Medicine & Rehabilitation

## 2020-06-24 ENCOUNTER — Other Ambulatory Visit: Payer: Self-pay

## 2020-06-24 VITALS — BP 156/78 | HR 90 | Temp 97.3°F | Wt 283.0 lb

## 2020-06-24 DIAGNOSIS — G5601 Carpal tunnel syndrome, right upper limb: Secondary | ICD-10-CM

## 2020-06-24 DIAGNOSIS — G894 Chronic pain syndrome: Secondary | ICD-10-CM | POA: Diagnosis not present

## 2020-06-24 DIAGNOSIS — K581 Irritable bowel syndrome with constipation: Secondary | ICD-10-CM | POA: Diagnosis not present

## 2020-06-24 DIAGNOSIS — M797 Fibromyalgia: Secondary | ICD-10-CM | POA: Diagnosis not present

## 2020-06-24 DIAGNOSIS — M5416 Radiculopathy, lumbar region: Secondary | ICD-10-CM | POA: Diagnosis not present

## 2020-06-24 DIAGNOSIS — M47816 Spondylosis without myelopathy or radiculopathy, lumbar region: Secondary | ICD-10-CM | POA: Diagnosis not present

## 2020-06-24 NOTE — Progress Notes (Signed)
Acupuncture treatment #2 Indication right hand paresthesias and wrist pain Needles placed at Th4-Th5 T8 8 as well as MH 6 and MH 7 electrical stimulation at 100 Hz between TH 4 and MH6 as well as between Th5 and MH 7 Patient tolerated procedure well Post procedure instructions given Total treatment time 25 minutes

## 2020-06-24 NOTE — Patient Instructions (Signed)
Acupuncture Acupuncture is a type of treatment that involves stimulating specific points on your body by inserting thin needles through your skin. Acupuncture is often used to treat pain, but it may also be used to help relieve other types of symptoms. Your health care provider may recommend acupuncture to help treat various conditions, such as:  Migraine headaches.  Tension headaches.  Arthritis pain.  Addiction.  Chronic pain.  Nausea and vomiting after a surgery.  High blood pressure (hypertension).  Chronic obstructive pulmonary disease (COPD).  Nausea caused by cancer treatment.  Sudden or severe (acute) pain. Acupuncture is based on traditional [place] medicine, which recognizes more than 2,000 points on the body that connect energy pathways (meridians) through the body. The goal in stimulating these points is to balance the physical, emotional, and mental energy in your body. Acupuncture is done by a health care provider who has specialized training (licensed acupuncture practitioner). Treatment often requires several acupuncture sessions. You may have acupuncture along with other medical treatments. Tell a health care provider about:  Any allergies you have.  All medicines you are taking, including vitamins, herbs, eye drops, creams, and over-the-counter medicines.  Any blood disorders you have.  Any surgeries you have had.  Any medical conditions you have.  Whether you are pregnant or may be pregnant. What are the risks? Generally, this is a safe treatment. However, problems may occur, including:  Skin infection.  Damage to organs or structures that are under the skin where a needle is placed. What happens before the treatment?  Your acupuncture practitioner will ask about your medical history and your symptoms.  You may have a physical exam. What happens during the treatment? The exact procedure will depend on your condition and how your acupuncture provider  treats it. In general:  Your skin will be cleaned with a germ-killing (antiseptic) solution.  Your acupuncture practitioner will open a new set of germ-free (sterile) needles.  The needles will be gently inserted into your skin. They will be left in place for a certain amount of time. You may feel slight pain or a tingling sensation.  Your acupuncture practitioner may: ? Apply electrical energy to the needles. ? Adjust the needles in certain ways.  After your procedure, the acupuncture practitioner will remove the needles, throw them away, and clean your skin. The procedure may vary among health care providers. What can I expect after the treatment? People react differently to acupuncture. Make sure you ask your acupuncture provider what to expect after your treatment. It is common to have:  Minor bruising.  Mild pain.  A small amount of bleeding. Follow these instructions at home:  Follow any instructions given by your provider after the treatment.  Keep all follow-up visits as told by your health care provider. This is important. Contact a health care provider if:  You have questions about your reaction to the treatment.  You have soreness.  You have skin irritation or redness.  You have a fever. Summary  Acupuncture is a type of treatment that involves stimulating specific points on your body by inserting thin needles through your skin.  This treatment is often used to treat pain, but it may also be used to help relieve other types of symptoms.  The exact procedure will depend on your condition and how your acupuncture provider treats it. This information is not intended to replace advice given to you by your health care provider. Make sure you discuss any questions you have with your health  care provider. Document Revised: 11/29/2017 Document Reviewed: 09/13/2017 Elsevier Patient Education  [place].

## 2020-07-05 ENCOUNTER — Other Ambulatory Visit: Payer: Self-pay

## 2020-07-05 ENCOUNTER — Telehealth (HOSPITAL_COMMUNITY): Payer: Self-pay

## 2020-07-05 DIAGNOSIS — E781 Pure hyperglyceridemia: Secondary | ICD-10-CM

## 2020-07-05 NOTE — Telephone Encounter (Signed)
Patient called and left a message on the triage vm wanting to know what needs to be done about her increasing triglyceride levels. After reviewing patients chart I see we do not have any lipid level results on her. I called patient back to get more info but patient did not answer. Left a message to return call to office.

## 2020-07-05 NOTE — Telephone Encounter (Signed)
Pt returned call stating her triglycerides were over 600 when she had labs drawn last month with [facility] and they want Dr.Bensimhon to manage.   Labs printed and given to [place] for review and advice.

## 2020-07-05 NOTE — Patient Outreach (Signed)
[facility]) Care Management  07/05/2020  Sara Tran July 17, 1956 [id number]   Telephone Assessment    Outreach attempt to patient. No answer after multiple rings.     Plan: RN CM will make outreach attempt to patient within the month of Sept if no return call.  Enzo Montgomery, RN,BSN,CCM [facility] Telephonic Care Management Coordinator Direct Phone: [phone] Toll Free: [phone] Fax: [phone]

## 2020-07-06 ENCOUNTER — Encounter: Payer: Self-pay | Admitting: Gastroenterology

## 2020-07-06 ENCOUNTER — Ambulatory Visit (INDEPENDENT_AMBULATORY_CARE_PROVIDER_SITE_OTHER): Payer: Medicare HMO | Admitting: Gastroenterology

## 2020-07-06 ENCOUNTER — Other Ambulatory Visit: Payer: Self-pay

## 2020-07-06 ENCOUNTER — Ambulatory Visit: Payer: Medicare HMO | Admitting: Gastroenterology

## 2020-07-06 VITALS — BP 153/75 | HR 91 | Temp 97.0°F | Ht 60.0 in | Wt 281.2 lb

## 2020-07-06 DIAGNOSIS — Z1211 Encounter for screening for malignant neoplasm of colon: Secondary | ICD-10-CM | POA: Diagnosis not present

## 2020-07-06 DIAGNOSIS — K5900 Constipation, unspecified: Secondary | ICD-10-CM

## 2020-07-06 DIAGNOSIS — Z1212 Encounter for screening for malignant neoplasm of rectum: Secondary | ICD-10-CM | POA: Diagnosis not present

## 2020-07-06 MED ORDER — LINACLOTIDE 145 MCG PO CAPS: 145.0000 ug | ORAL_CAPSULE | Freq: Every day | ORAL | 5 refills | Status: DC

## 2020-07-06 NOTE — Patient Instructions (Signed)
1. Colonoscopy as scheduled.  Please see separate instructions. 2. Start Linzess 145 mcg once daily on an empty stomach for constipation.  Samples provided.  Prescription sent to pharmacy.  If medication is too strong or not strong enough, please let us know as we can adjust the dosage.

## 2020-07-06 NOTE — Progress Notes (Deleted)
Primary Care Physician:  Sharilyn Sites, MD  Primary Gastroenterologist:  Formerly Barney Drain, MD   No chief complaint on file.   HPI:  Sara Tran is a 64 y.o. female here to schedule 10-year follow-up colonoscopy.  She also has abdominal pain.  Last colonoscopy in 2011, she had a tortuous colon, diverticulosis, sigmoid polyp removed which was benign.  Random colon biopsies negative for microscopic colitis.  Based on conscious sedation needs, Dr. Oneida Alar recommended that she undergo propofol this procedure.  Current Outpatient Medications  Medication Sig Dispense Refill  . albuterol (PROVENTIL HFA;VENTOLIN HFA) 108 (90 BASE) MCG/ACT inhaler Inhale 2 puffs into the lungs every 6 (six) hours as needed for wheezing or shortness of breath.     Marland Kitchen albuterol (PROVENTIL) (2.5 MG/3ML) 0.083% nebulizer solution Take 2.5 mg by nebulization every 4 (four) hours as needed for wheezing or shortness of breath.     . ALPRAZolam (XANAX) 0.5 MG tablet Take 0.5 mg by mouth daily as needed.    . ALPRAZolam (XANAX) 1 MG tablet Take 0.5 mg by mouth daily as needed for anxiety.     Marland Kitchen amLODipine (NORVASC) 10 MG tablet Take 10 mg by mouth every evening.     . Ascorbic Acid (VITAMIN C) 1000 MG tablet Take 1,000 mg by mouth daily.    . baclofen (LIORESAL) 10 MG tablet Take 1 tablet (10 mg total) by mouth 3 (three) times daily as needed for muscle spasms. 45 tablet 3  . clobetasol (TEMOVATE) 0.05 % external solution Apply 1 application topically daily as needed (scalp irritation).     . DERMA-SMOOTHE/FS SCALP 0.01 % OIL APPLY TO AFFECTED AREASFOF SCALP AT BEDTIME.N    . desonide (DESOWEN) 0.05 % cream APPLY TO AFFECTED AREASOOF EYEBROWS TWICE DAILYAAS NEEDED.    Marland Kitchen EPINEPHrine 0.3 mg/0.3 mL IJ SOAJ injection Inject 0.3 mg into the muscle as needed for anaphylaxis.     . fluticasone (FLONASE) 50 MCG/ACT nasal spray     . furosemide (LASIX) 40 MG tablet Take 40 mg by mouth daily.     Marland Kitchen gabapentin (NEURONTIN) 600 MG  tablet Take 600 mg by mouth 3 (three) times daily.     . isosorbide mononitrate (IMDUR) 30 MG 24 hr tablet Take 30 mg by mouth daily.     Marland Kitchen loratadine (CLARITIN) 10 MG tablet Take 10 mg by mouth daily.     Marland Kitchen losartan-hydrochlorothiazide (HYZAAR) 100-25 MG tablet Take 1 tablet by mouth daily.    . magnesium oxide (MAG-OX) 400 MG tablet Take 400 mg by mouth 2 (two) times daily.     . nitroGLYCERIN (NITROSTAT) 0.4 MG SL tablet Place 0.4 mg under the tongue every 5 (five) minutes as needed for chest pain.     . nortriptyline (PAMELOR) 10 MG capsule Take 1-2 capsules (10-20 mg total) by mouth at bedtime. 60 capsule 2  . ondansetron (ZOFRAN) 4 MG tablet Take 4 mg by mouth every 8 (eight) hours as needed for nausea or vomiting.     Marland Kitchen oxyCODONE-acetaminophen (PERCOCET) 10-325 MG tablet Take 1 tablet by mouth 4 (four) times daily as needed for pain.     . potassium chloride SA (K-DUR) 20 MEQ tablet Take 20 mEq by mouth daily.     Marland Kitchen senna-docusate (SENOKOT-S) 8.6-50 MG tablet Take 1 tablet by mouth daily.    . [place] JOHNS WORT PO Take 160 mg by mouth as needed.     . SYMBICORT 160-4.5 MCG/ACT inhaler Take 2 puffs by mouth  2 (two) times daily.    . TRULICITY 1.5 GU/5.4YH SOPN Inject 1.5 mg into the skin every 7 (seven) days.     . Vitamin D, Ergocalciferol, (DRISDOL) 1.25 MG (50000 UNIT) CAPS capsule TAKE 1 CAPSULE BY MOUTHAONCE A WEEK FOR 12 WEEKS, THEN TAKE 2000 UNITS OTC AFTER FINISHING 12TH DOSE.     No current facility-administered medications for this visit.    Allergies as of 07/06/2020 - Review Complete 06/24/2020  Allergen Reaction Noted  . Ancef [cefazolin] Anaphylaxis and Shortness Of Breath 07/23/2017  . Bee venom Anaphylaxis 05/04/2017  . Meloxicam  03/16/2020  . Ceftin [cefuroxime axetil] Swelling 10/06/2013  . Chicken meat (diagnostic) Nausea And Vomiting 03/16/2020  . Ciprofloxacin Cough 09/27/2011  . Macrodantin [nitrofurantoin] Other (See Comments) 07/23/2017  . Meat [alpha-gal]  Nausea And Vomiting 03/16/2020  . Milk-related compounds Other (See Comments) 11/21/2016  . Nsaids Other (See Comments) 03/16/2020  . Other  07/23/2017  . Latex Itching and Rash 04/22/2012    Past Medical History:  Diagnosis Date  . Adnexal cyst    Right simple cyst seen on [place]  . Anginal pain ([place])   . Anxiety   . Arthritis   . Asthma   . Atrial fibrillation (HCC)    hx of   . CHF (congestive heart failure) ([place])   . Chronic back pain   . Chronic hip pain   . COPD (chronic obstructive pulmonary disease) ([place])   . Coronary artery disease    stents placed   . Dyspnea   . GERD (gastroesophageal reflux disease)   . Headache    due to pinched nerve damaage   . Heart murmur    hx of slight murmur   . History of bronchitis   . History of cardiac catheterization    Normal coronaries 2013  . History of kidney stones   . Hot flashes   . Hypertension   . Hypertriglyceridemia   . IBS (irritable bowel syndrome)   . Internal hemorrhoids   . Morbid obesity ([place])    s/p lap band surgery  . Myocardial infarction ([place])    times 3  . Obesity   . Pulmonary HTN ([place])    [facility] 2008:  RA pressures 13/10 with a mean of 7 mmHg.  RV pressure 06/23 with end diastolic pressure of 15 mmHg.  PA pressure 49/23 with a mean of 35 mmHg.  Pulmonary capillary wedge 17/50 with a mean of 14 mmHg. The PA saturation was 68%.  RA saturation was 71% and aortic saturation was 90%.  Cardiac output was 6.0 with a cardiac index of 2.80 by Fick.  Normal coronaries by cath 2008.  Marland Kitchen Renal insufficiency   . Sciatica of right side   . Sleep apnea    CPAP use does not know settings   . Trauma   . Type 2 diabetes mellitus ([place])    type II   . Uterine fibroid     Past Surgical History:  Procedure Laterality Date  . APPENDECTOMY    . CARDIAC CATHETERIZATION  07/2007, 05/2012   Normal coronary arteries  . COLONOSCOPY  10/18/2010   SLF:2-mm sessile sigmoid polyp/diverticula, tortuous colon.  Biopsies of sigmoid  colon polyp and random colon biopsies benign  . CYSTOSCOPY/URETEROSCOPY/HOLMIUM LASER/STENT PLACEMENT Right 08/01/2017   Procedure: CYSTOSCOPY/ RIGHT URETEROSCOPY/ RIGHT RETROGRADE/ HOLMIUM LASER  AND RIGHT STENT PLACEMENT FIRST STAGE;  Surgeon: Irine Seal, MD;  Location: [facility];  Service: Urology;  Laterality: Right;  . CYSTOSCOPY/URETEROSCOPY/HOLMIUM LASER/STENT PLACEMENT  Right 08/08/2017   Procedure: RIGHT URETEROSCOPY WITH HOLMIUM LASER RIGHT STENT PLACEMENT;  Surgeon: Irine Seal, MD;  Location: [facility];  Service: Urology;  Laterality: Right;  . EXTRACORPOREAL SHOCK WAVE LITHOTRIPSY Right 08/22/2017   Procedure: RIGHT EXTRACORPOREAL SHOCK WAVE LITHOTRIPSY (ESWL);  Surgeon: Alexis Frock, MD;  Location: [facility];  Service: Urology;  Laterality: Right;  . HEMORRHOID BANDING  07/2012   Dr. Oneida Alar  . LAPAROSCOPIC CHOLECYSTECTOMY  1985  . LAPAROSCOPIC GASTRIC BANDING  12/12/2010  . LEFT AND RIGHT HEART CATHETERIZATION WITH CORONARY ANGIOGRAM N/A 06/03/2012   Procedure: LEFT AND RIGHT HEART CATHETERIZATION WITH CORONARY ANGIOGRAM;  Surgeon: Jolaine Artist, MD;  Location: [facility] CATH LAB;  Service: Cardiovascular;  Laterality: N/A;  . PILONIDAL CYST EXCISION  1974  . RIGHT/LEFT HEART CATH AND CORONARY ANGIOGRAPHY N/A 03/28/2020   Procedure: RIGHT/LEFT HEART CATH AND CORONARY ANGIOGRAPHY;  Surgeon: Jolaine Artist, MD;  Location: [facility] CV LAB;  Service: Cardiovascular;  Laterality: N/A;  . 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 TUMOR EXCISION  10/2011   Face    Family History  Problem Relation Age of Onset  . Diabetes Mother   . Heart failure Mother   . Breast cancer Sister   . CAD Father   . Hypertension Father   . Heart failure Father   . Cancer Paternal Grandfather   . Hypertension Paternal Grandmother   . Stroke Paternal Grandmother   . Other Maternal Grandmother        tonsilitis  . Diabetes Maternal Grandfather   . Hypertension Maternal Grandfather   . Breast cancer Paternal  Aunt     Social History   Socioeconomic History  . Marital status: Single    Spouse name: Not on file  . Number of children: 0  . Years of education: Not on file  . Highest education level: Not on file  Occupational History  . Occupation: Volunteers  . 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 Smoking status: Former Smoker    Types: Cigarettes    Quit date: 08/08/1996    Years since quitting: 23.9  . Smokeless tobacco: Never Used  Vaping Use  . Vaping Use: Never used  Substance and Sexual Activity  . Alcohol use: No  . Drug use: No  . Sexual activity: Not Currently    Birth control/protection: Post-menopausal  Other Topics Concern  . Not on file  Social History Narrative  . Not on file   Social Determinants of Health   Financial Resource Strain: Unknown  . Difficulty of Paying Living Expenses: Patient refused  Food Insecurity: Food Insecurity Present  . Worried About Charity fundraiser in the Last Year: Often true  . Ran Out of Food in the Last Year: Sometimes true  Transportation Needs: Unknown  . Lack of Transportation (Medical): Patient refused  . Lack of Transportation (Non-Medical): Patient refused  Physical Activity: Insufficiently Active  . Days of Exercise per Week: 1 day  . Minutes of Exercise per Session: 10 min  Stress: Unknown  . Feeling of Stress : Patient refused  Social Connections: Unknown  . Frequency of Communication with Friends and Family: More than three times a week  . Frequency of Social Gatherings with Friends and Family: Patient refused  . Attends Religious Services: Patient refused  . Active Member of Clubs or Organizations: Yes  . Attends Archivist Meetings: Patient refused  . Marital  Status: Patient refused  Intimate Partner Violence: Unknown  . Fear of Current or Ex-Partner: Patient refused  . Emotionally Abused: Patient refused  . Physically Abused: Patient refused  .  Sexually Abused: Patient refused      ROS:  General: Negative for anorexia, weight loss, fever, chills, fatigue, weakness. Eyes: Negative for vision changes.  ENT: Negative for hoarseness, difficulty swallowing , nasal congestion. CV: Negative for chest pain, angina, palpitations, dyspnea on exertion, peripheral edema.  Respiratory: Negative for dyspnea at rest, dyspnea on exertion, cough, sputum, wheezing.  GI: See history of present illness. GU:  Negative for dysuria, hematuria, urinary incontinence, urinary frequency, nocturnal urination.  MS: Negative for joint pain, low back pain.  Derm: Negative for rash or itching.  Neuro: Negative for weakness, abnormal sensation, seizure, frequent headaches, memory loss, confusion.  Psych: Negative for anxiety, depression, suicidal ideation, hallucinations.  Endo: Negative for unusual weight change.  Heme: Negative for bruising or bleeding. Allergy: Negative for rash or hives.    Physical Examination:  LMP 06/06/2013    General: Well-nourished, well-developed in no acute distress.  Head: Normocephalic, atraumatic.   Eyes: Conjunctiva pink, no icterus. Mouth: Oropharyngeal mucosa moist and pink , no lesions erythema or exudate. Neck: Supple without thyromegaly, masses, or lymphadenopathy.  Lungs: Clear to auscultation bilaterally.  Heart: Regular rate and rhythm, no murmurs rubs or gallops.  Abdomen: Bowel sounds are normal, nontender, nondistended, no hepatosplenomegaly or masses, no abdominal bruits or    hernia , no rebound or guarding.   Rectal: *** Extremities: No lower extremity edema. No clubbing or deformities.  Neuro: Alert and oriented x 4 , grossly normal neurologically.  Skin: Warm and dry, no rash or jaundice.   Psych: Alert and cooperative, normal mood and affect.  Labs: ***  Imaging Studies: No results found.

## 2020-07-06 NOTE — Progress Notes (Signed)
Primary Care Physician:  Sharilyn Sites, MD  Primary Gastroenterologist:  Formerly Barney Drain, MD   Chief Complaint  Patient presents with  . Consult    TCS due for 10 yr repeat  . Abdominal Pain    lower abd, comes/goes throughout the day but is daily and mostlt after eating, unable to eat anything heavy    HPI:  Sara Tran is a 64 y.o. female here to schedule 10-year follow-up colonoscopy.  She also has abdominal bloating after meals, constipation.  Last colonoscopy in 2011, she had a tortuous colon, diverticulosis, sigmoid polyp removed which was benign.  Random colon biopsies negative for microscopic colitis.  Based on conscious sedation needs, Dr. Oneida Alar recommended that she undergo propofol this procedure.  Patient reports a lot postprandial abdominal bloating, lower abdominal discomfort.  Feels like she needs to have a bowel movement but does not.  Symptoms present for the past several months.   May go a couple of days without a bowel movement.  She uses Senokot-S and/or magnesium citrate as needed.  Denies diarrhea.  Denies any melena or rectal bleeding.  Denies any heartburn.  Rarely has to take Zofran.  No nausea or vomiting.  She takes Percocet every day for pain.  Diet is fairly limited.  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  Cannot tolerate any type of red meat, chicken, [place], pork, dairy in any form.    Current Outpatient Medications  Medication Sig Dispense Refill  . albuterol (PROVENTIL HFA;VENTOLIN HFA) 108 (90 BASE) MCG/ACT inhaler Inhale 2 puffs into the lungs every 6 (six) hours as needed for wheezing or shortness of breath.     Marland Kitchen albuterol (PROVENTIL) (2.5 MG/3ML) 0.083% nebulizer solution Take 2.5 mg by nebulization every 4 (four) hours as needed for wheezing or shortness of breath.     . ALPRAZolam (XANAX) 0.5 MG tablet Take 0.5 mg by mouth daily as needed.    Marland Kitchen amLODipine (NORVASC) 10 MG tablet Take 10 mg by mouth every evening.     .  Ascorbic Acid (VITAMIN C) 1000 MG tablet Take 1,000 mg by mouth daily.    . baclofen (LIORESAL) 10 MG tablet Take 1 tablet (10 mg total) by mouth 3 (three) times daily as needed for muscle spasms. 45 tablet 3  . clobetasol (TEMOVATE) 0.05 % external solution Apply 1 application topically daily as needed (scalp irritation).     . DERMA-SMOOTHE/FS SCALP 0.01 % OIL APPLY TO AFFECTED AREASFOF SCALP AT BEDTIME.N    . desonide (DESOWEN) 0.05 % cream APPLY TO AFFECTED AREASOOF EYEBROWS TWICE DAILYAAS NEEDED.    Marland Kitchen EPINEPHrine 0.3 mg/0.3 mL IJ SOAJ injection Inject 0.3 mg into the muscle as needed for anaphylaxis.     . fluticasone (FLONASE) 50 MCG/ACT nasal spray Place 1 spray into both nostrils daily.     . furosemide (LASIX) 40 MG tablet Take 40 mg by mouth daily.     Marland Kitchen gabapentin (NEURONTIN) 600 MG tablet Take 600 mg by mouth 3 (three) times daily.     . isosorbide mononitrate (IMDUR) 30 MG 24 hr tablet Take 30 mg by mouth daily.     Marland Kitchen loratadine (CLARITIN) 10 MG tablet Take 10 mg by mouth daily.     Marland Kitchen losartan-hydrochlorothiazide (HYZAAR) 100-25 MG tablet Take 1 tablet by mouth daily.    . magnesium oxide (MAG-OX) 400 MG tablet Take 400 mg by mouth 2 (two) times daily.     . nitroGLYCERIN (NITROSTAT) 0.4 MG SL  tablet Place 0.4 mg under the tongue every 5 (five) minutes as needed for chest pain.     Marland Kitchen ondansetron (ZOFRAN) 4 MG tablet Take 4 mg by mouth every 8 (eight) hours as needed for nausea or vomiting.     Marland Kitchen oxyCODONE-acetaminophen (PERCOCET) 10-325 MG tablet Take 1 tablet by mouth 4 (four) times daily as needed for pain.     . potassium chloride SA (K-DUR) 20 MEQ tablet Take 20 mEq by mouth daily.     Marland Kitchen senna-docusate (SENOKOT-S) 8.6-50 MG tablet Take 1 tablet by mouth at bedtime as needed.     . SYMBICORT 160-4.5 MCG/ACT inhaler Take 2 puffs by mouth 2 (two) times daily.    . TRULICITY 1.5 WU/9.8JX SOPN Inject 1.5 mg into the skin every 7 (seven) days.     . Vitamin D, Ergocalciferol, (DRISDOL)  1.25 MG (50000 UNIT) CAPS capsule TAKE 1 CAPSULE BY MOUTHAONCE A WEEK FOR 12 WEEKS, THEN TAKE 2000 UNITS OTC AFTER FINISHING 12TH DOSE.    Marland Kitchen        No current facility-administered medications for this visit.    Allergies as of 07/06/2020 - Review Complete 07/06/2020  Allergen Reaction Noted  . Ancef [cefazolin] Anaphylaxis and Shortness Of Breath 07/23/2017  . Bee venom Anaphylaxis 05/04/2017  . Meloxicam  03/16/2020  . Ceftin [cefuroxime axetil] Swelling 10/06/2013  . Chicken meat (diagnostic) Nausea And Vomiting 03/16/2020  . Ciprofloxacin Cough 09/27/2011  . Macrodantin [nitrofurantoin] Other (See Comments) 07/23/2017  . Meat [alpha-gal] Nausea And Vomiting 03/16/2020  . Milk-related compounds Other (See Comments) 11/21/2016  . Nsaids Other (See Comments) 03/16/2020  . Other  07/23/2017  . Latex Itching and Rash 04/22/2012    Past Medical History:  Diagnosis Date  . Adnexal cyst    Right simple cyst seen on [place]  . Anginal pain ([place])   . Anxiety   . Arthritis   . Asthma   . Atrial fibrillation (HCC)    hx of   . CHF (congestive heart failure) ([place])   . Chronic back pain   . Chronic hip pain   . COPD (chronic obstructive pulmonary disease) ([place])   . Coronary artery disease    stents placed   . Dyspnea   . GERD (gastroesophageal reflux disease)   . Headache    due to pinched nerve damaage   . Heart murmur    hx of slight murmur   . History of bronchitis   . History of cardiac catheterization    Normal coronaries 2013  . History of kidney stones   . Hot flashes   . Hypertension   . Hypertriglyceridemia   . IBS (irritable bowel syndrome)   . Internal hemorrhoids   . Morbid obesity ([place])    s/p lap band surgery  . Myocardial infarction ([place])    times 3  . Obesity   . Pulmonary HTN ([place])    [place] 2008:  RA pressures 13/10 with a mean of 7 mmHg.  RV pressure 91/47 with end diastolic pressure of 15 mmHg.  PA pressure 49/23 with a mean of 35 mmHg.  Pulmonary  capillary wedge 17/50 with a mean of 14 mmHg. The PA saturation was 68%.  RA saturation was 71% and aortic saturation was 90%.  Cardiac output was 6.0 with a cardiac index of 2.80 by Fick.  Normal coronaries by cath 2008.  Marland Kitchen Renal insufficiency   . Sciatica of right side   . Sleep apnea    CPAP use  does not know settings   . Trauma   . Type 2 diabetes mellitus ([place])    type II   . Uterine fibroid     Past Surgical History:  Procedure Laterality Date  . APPENDECTOMY    . CARDIAC CATHETERIZATION  07/2007, 05/2012   Normal coronary arteries  . COLONOSCOPY  10/18/2010   SLF:2-mm sessile sigmoid polyp/diverticula, tortuous colon.  Biopsies of sigmoid colon polyp and random colon biopsies benign  . CYSTOSCOPY/URETEROSCOPY/HOLMIUM LASER/STENT PLACEMENT Right 08/01/2017   Procedure: CYSTOSCOPY/ RIGHT URETEROSCOPY/ RIGHT RETROGRADE/ HOLMIUM LASER  AND RIGHT STENT PLACEMENT FIRST STAGE;  Surgeon: Irine Seal, MD;  Location: [facility];  Service: Urology;  Laterality: Right;  . CYSTOSCOPY/URETEROSCOPY/HOLMIUM LASER/STENT PLACEMENT Right 08/08/2017   Procedure: RIGHT URETEROSCOPY WITH HOLMIUM LASER RIGHT STENT PLACEMENT;  Surgeon: Irine Seal, MD;  Location: [facility];  Service: Urology;  Laterality: Right;  . EXTRACORPOREAL SHOCK WAVE LITHOTRIPSY Right 08/22/2017   Procedure: RIGHT EXTRACORPOREAL SHOCK WAVE LITHOTRIPSY (ESWL);  Surgeon: Alexis Frock, MD;  Location: [facility];  Service: Urology;  Laterality: Right;  . HEMORRHOID BANDING  07/2012   Dr. Oneida Alar  . LAPAROSCOPIC CHOLECYSTECTOMY  1985  . LAPAROSCOPIC GASTRIC BANDING  12/12/2010  . LEFT AND RIGHT HEART CATHETERIZATION WITH CORONARY ANGIOGRAM N/A 06/03/2012   Procedure: LEFT AND RIGHT HEART CATHETERIZATION WITH CORONARY ANGIOGRAM;  Surgeon: Jolaine Artist, MD;  Location: [facility] CATH LAB;  Service: Cardiovascular;  Laterality: N/A;  . PILONIDAL CYST EXCISION  1974  . RIGHT/LEFT HEART CATH AND CORONARY ANGIOGRAPHY N/A 03/28/2020   Procedure: RIGHT/LEFT  HEART CATH AND CORONARY ANGIOGRAPHY;  Surgeon: Jolaine Artist, MD;  Location: [facility] CV LAB;  Service: Cardiovascular;  Laterality: N/A;  . 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 TUMOR EXCISION  10/2011   Face    Family History  Problem Relation Age of Onset  . Diabetes Mother   . Heart failure Mother   . Breast cancer Sister   . CAD Father   . Hypertension Father   . Heart failure Father   . Cancer Paternal Grandfather   . Hypertension Paternal Grandmother   . Stroke Paternal Grandmother   . Other Maternal Grandmother        tonsilitis  . Diabetes Maternal Grandfather   . Hypertension Maternal Grandfather   . Breast cancer Paternal Aunt     Social History   Socioeconomic History  . Marital status: Single    Spouse name: Not on file  . Number of children: 0  . Years of education: Not on file  . Highest education level: Not on file  Occupational History  . Occupation: Volunteers  . 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 Smoking status: Former Smoker    Types: Cigarettes    Quit date: 08/08/1996    Years since quitting: 23.9  . Smokeless tobacco: Never Used  Vaping Use  . Vaping Use: Never used  Substance and Sexual Activity  . Alcohol use: No  . Drug use: No  . Sexual activity: Not Currently    Birth control/protection: Post-menopausal  Other Topics Concern  . Not on file  Social History Narrative  . Not on file   Social Determinants of Health   Financial Resource Strain: Unknown  . Difficulty of Paying Living Expenses: Patient refused  Food Insecurity: Food Insecurity Present  . Worried About Charity fundraiser in the Last Year: Often true  . Ran  Out of Food in the Last Year: Sometimes true  Transportation Needs: Unknown  . Lack of Transportation (Medical): Patient refused  . Lack of Transportation (Non-Medical): Patient refused  Physical Activity: Insufficiently Active  . Days of Exercise per  Week: 1 day  . Minutes of Exercise per Session: 10 min  Stress: Unknown  . Feeling of Stress : Patient refused  Social Connections: Unknown  . Frequency of Communication with Friends and Family: More than three times a week  . Frequency of Social Gatherings with Friends and Family: Patient refused  . Attends Religious Services: Patient refused  . Active Member of Clubs or Organizations: Yes  . Attends Archivist Meetings: Patient refused  . Marital Status: Patient refused  Intimate Partner Violence: Unknown  . Fear of Current or Ex-Partner: Patient refused  . Emotionally Abused: Patient refused  . Physically Abused: Patient refused  . Sexually Abused: Patient refused      ROS:  General: Negative for anorexia, weight loss, fever, chills, fatigue, weakness. Eyes: Negative for vision changes.  ENT: Negative for hoarseness, difficulty swallowing , nasal congestion. CV: Negative for chest pain, angina, palpitations, dyspnea on exertion, peripheral edema.  Respiratory: Negative for dyspnea at rest, dyspnea on exertion, cough, sputum, wheezing.  GI: See history of present illness. GU:  Negative for dysuria, hematuria, urinary incontinence, urinary frequency, nocturnal urination.  MS: Positive joint pain, low back pain.  Derm: Negative for rash or itching.  Neuro: Negative for weakness, abnormal sensation, seizure, frequent headaches, memory loss, confusion.  Psych: Negative for anxiety, depression, suicidal ideation, hallucinations.  Endo: Negative for unusual weight change.  Heme: Negative for bruising or bleeding. Allergy: Negative for rash or hives.    Physical Examination:  BP (!) 153/75   Pulse 91   Temp (!) 97 F (36.1 C)   Ht 5' (1.524 m)   Wt 281 lb 3.2 oz (127.6 kg)   LMP 06/06/2013   BMI 54.92 kg/m    General: Well-nourished, well-developed in no acute distress.  Obese. Head: Normocephalic, atraumatic.   Eyes: Conjunctiva pink, no icterus. Mouth:  masked Neck: Supple without thyromegaly, masses, or lymphadenopathy.  Lungs: Clear to auscultation bilaterally.  Heart: Regular rate and rhythm, no murmurs rubs or gallops.  Abdomen: Bowel sounds are normal, nontender, nondistended, no hepatosplenomegaly or masses, no abdominal bruits or    hernia , no rebound or guarding.  Exam limited by body habitus. Rectal: Not performed Extremities: No lower extremity edema. No clubbing or deformities.  Neuro: Alert and oriented x 4 , grossly normal neurologically.  Skin: Warm and dry, no rash or jaundice.   Psych: Alert and cooperative, normal mood and affect.  Labs: Labs from June 2021: BUN 25, creatinine 1.3, hemoglobin 14.3, hematocrit 44.1, MCV 96, albumin 4.4, alk phos 87, AST 33, ALT 27, heme-negative in April 2021.  Imaging Studies: No results found.  Impression/plan:  Pleasant 64 year old female with history of morbid obesity, numerous comorbidities as outlined above, presenting for further evaluation of abdominal bloating and discomfort, constipation, desires 10-year follow-up colonoscopy.  Patient has noted increased issues with constipation in the setting of opioid use.  She currently uses Senokot-S, magnesium citrate as needed.  She has increased abdominal bloating and discomfort the longer she goes without a bowel movement.  Diet is fairly limited given multiple food allergies.  Recommend trial of Linzess 145 mcg daily on empty stomach for constipation.  Would stop Senokot-S and magnesium citrate.  She will call if constipation and abdominal discomfort  are not improved on Linzess.  In the interim we will schedule her for colonoscopy.  Plan for deep sedation given previous conscious sedation needs, multiple comorbidities, polypharmacy.  ASA III.  I have discussed the risks, alternatives, benefits with regards to but not limited to the risk of reaction to medication, bleeding, infection, perforation and the patient is agreeable to proceed.  Written consent to be obtained.

## 2020-07-07 ENCOUNTER — Ambulatory Visit: Payer: Self-pay

## 2020-07-07 ENCOUNTER — Telehealth (HOSPITAL_COMMUNITY): Payer: Self-pay | Admitting: Cardiology

## 2020-07-07 NOTE — Telephone Encounter (Signed)
Opened in error

## 2020-07-07 NOTE — Addendum Note (Signed)
Addended by: Christyana Corwin, Sharlot Gowda on: 07/07/2020 11:30 AM   Modules accepted: Orders

## 2020-07-07 NOTE — Telephone Encounter (Signed)
Abnormal labs received from PCP Total Chol 270 HDL 60 LDL 106 Trig 606   Per Dr Haroldine Laws- send to lipid clinic  Order placed Results to be scanned into chart and left message for patient

## 2020-07-11 ENCOUNTER — Telehealth: Payer: Self-pay

## 2020-07-11 NOTE — Telephone Encounter (Signed)
Called pt, TCS w/Prop w/Dr. Abbey Chatters scheduled for 08/09/20 at 11:00am. Orders entered.  PA for TCS submitted via [employer] website. To be contacted with PA#. Procedure tracking# [id number].  Sara Tran, pt wants to know if Linzess can be decreased. She took 1 dose of Linzess 135mcg and had diarrhea and cramping for 3-4 days.

## 2020-07-12 ENCOUNTER — Other Ambulatory Visit (HOSPITAL_COMMUNITY): Payer: Self-pay | Admitting: Internal Medicine

## 2020-07-12 ENCOUNTER — Other Ambulatory Visit: Payer: Self-pay

## 2020-07-12 NOTE — Telephone Encounter (Signed)
TCS approved. [employer]# [id number], valid 08/09/20-09/08/20.

## 2020-07-12 NOTE — Telephone Encounter (Signed)
Patient requesting to try lower dose of Linzess.   Offer her samples of Linzess 12mcg daily. #10. The first RX sent was for 161mcg so if she has not picked it up yet then let's cancel the rX. We can send in new one for 13mcg daily, #30 with 5 refills.

## 2020-07-12 NOTE — Telephone Encounter (Signed)
Pt called office, she will pickup Linzess 69mcg samples tomorrow. She didn't pick up Linzess 189mcg. She is aware new rx will be called into [facility] for Linzess 47mcg.  [employer], spoke to Leona, informed him to cancel Linzess 189mcg rx. New rx for Linzess 26mcg daily, #30 with 5 refills.

## 2020-07-12 NOTE — Telephone Encounter (Signed)
Tried to call pt, no answer, LMOVM for return call.  

## 2020-07-12 NOTE — Telephone Encounter (Signed)
Pre-op/covid test 08/05/20 at 9:30am. Letter mailed with procedure instructions.

## 2020-07-19 ENCOUNTER — Encounter: Payer: Self-pay | Admitting: Physical Medicine & Rehabilitation

## 2020-07-19 ENCOUNTER — Encounter: Payer: Medicare HMO | Attending: Physical Medicine & Rehabilitation | Admitting: Physical Medicine & Rehabilitation

## 2020-07-19 ENCOUNTER — Other Ambulatory Visit (HOSPITAL_COMMUNITY): Payer: Self-pay | Admitting: *Deleted

## 2020-07-19 ENCOUNTER — Other Ambulatory Visit: Payer: Self-pay

## 2020-07-19 VITALS — BP 126/70 | HR 77 | Temp 98.3°F | Ht 60.0 in | Wt 281.0 lb

## 2020-07-19 DIAGNOSIS — M47816 Spondylosis without myelopathy or radiculopathy, lumbar region: Secondary | ICD-10-CM | POA: Insufficient documentation

## 2020-07-19 DIAGNOSIS — M25531 Pain in right wrist: Secondary | ICD-10-CM

## 2020-07-19 DIAGNOSIS — M797 Fibromyalgia: Secondary | ICD-10-CM | POA: Insufficient documentation

## 2020-07-19 DIAGNOSIS — K581 Irritable bowel syndrome with constipation: Secondary | ICD-10-CM | POA: Insufficient documentation

## 2020-07-19 DIAGNOSIS — R202 Paresthesia of skin: Secondary | ICD-10-CM

## 2020-07-19 DIAGNOSIS — M5416 Radiculopathy, lumbar region: Secondary | ICD-10-CM | POA: Diagnosis not present

## 2020-07-19 DIAGNOSIS — G894 Chronic pain syndrome: Secondary | ICD-10-CM | POA: Insufficient documentation

## 2020-07-19 DIAGNOSIS — G5601 Carpal tunnel syndrome, right upper limb: Secondary | ICD-10-CM

## 2020-07-19 NOTE — Progress Notes (Signed)
Acupuncture treatment #3 Indication right hand paresthesias and wrist pain Needles placed at Th4-Th5 T8 8 as well as MH 6 and MH 7 electrical stimulation at 100 Hz between TH 4 and MH6 as well as between Th5 and MH 7 Patient tolerated procedure well Post procedure instructions given Total treatment time 25 minutes  Will do another treatment in 1 to 2 weeks and then reassess efficacy.

## 2020-07-22 ENCOUNTER — Ambulatory Visit: Payer: Medicare HMO | Admitting: Obstetrics and Gynecology

## 2020-07-26 ENCOUNTER — Encounter: Payer: Self-pay | Admitting: Physical Medicine & Rehabilitation

## 2020-07-26 ENCOUNTER — Encounter: Payer: Self-pay | Admitting: Obstetrics and Gynecology

## 2020-07-26 ENCOUNTER — Ambulatory Visit (INDEPENDENT_AMBULATORY_CARE_PROVIDER_SITE_OTHER): Payer: Medicare HMO | Admitting: Obstetrics and Gynecology

## 2020-07-26 ENCOUNTER — Other Ambulatory Visit: Payer: Self-pay

## 2020-07-26 ENCOUNTER — Other Ambulatory Visit (HOSPITAL_COMMUNITY)
Admission: RE | Admit: 2020-07-26 | Discharge: 2020-07-26 | Disposition: A | Discharge status: Home / Self Care | Payer: Medicare HMO | Source: Ambulatory Visit | Attending: Obstetrics and Gynecology | Admitting: Obstetrics and Gynecology

## 2020-07-26 ENCOUNTER — Encounter: Payer: Medicare HMO | Admitting: Physical Medicine & Rehabilitation

## 2020-07-26 VITALS — BP 113/63 | HR 94 | Temp 98.2°F | Ht 60.0 in | Wt 280.2 lb

## 2020-07-26 VITALS — BP 155/74 | HR 92 | Ht 60.0 in | Wt 279.4 lb

## 2020-07-26 DIAGNOSIS — Z113 Encounter for screening for infections with a predominantly sexual mode of transmission: Secondary | ICD-10-CM

## 2020-07-26 DIAGNOSIS — K581 Irritable bowel syndrome with constipation: Secondary | ICD-10-CM | POA: Diagnosis not present

## 2020-07-26 DIAGNOSIS — G5601 Carpal tunnel syndrome, right upper limb: Secondary | ICD-10-CM

## 2020-07-26 DIAGNOSIS — G894 Chronic pain syndrome: Secondary | ICD-10-CM | POA: Diagnosis not present

## 2020-07-26 DIAGNOSIS — M5416 Radiculopathy, lumbar region: Secondary | ICD-10-CM | POA: Diagnosis not present

## 2020-07-26 DIAGNOSIS — M797 Fibromyalgia: Secondary | ICD-10-CM | POA: Diagnosis not present

## 2020-07-26 DIAGNOSIS — M47816 Spondylosis without myelopathy or radiculopathy, lumbar region: Secondary | ICD-10-CM | POA: Diagnosis not present

## 2020-07-26 NOTE — Patient Instructions (Signed)
You will need no more than 12 visits for this protocol

## 2020-07-26 NOTE — Progress Notes (Signed)
Acupuncture treatment #4 Indication right hand paresthesias and wrist pain Needles placed at Th4-Th5 T8 8 as well as MH 6 and MH 7 electrical stimulation at 100 Hz between TH 4 and MH6 as well as between Th5 and MH 7 Patient tolerated procedure well Post procedure instructions given Total treatment time 25 minutes  You will need up to 12 visits with this protocol.

## 2020-07-26 NOTE — Progress Notes (Signed)
PATIENT ID: Sara Tran, female     DOB: 08-29-1956, 64 y.o.     MRN: [id number]   [facility] Visit  07/26/20     PATIENT NAME: Sara Tran     MRN [id number]     DOB: 04/14/1956  CC & HPI:  No chief complaint on file.  [name] is a 64 y.o. female presenting today for STI testing recent new contact  ROS:  Review of Systems  Constitutional: Negative.   HENT: Negative.   Eyes: Negative.   Respiratory: Negative.   Cardiovascular: Negative.   Gastrointestinal: Negative.   Genitourinary: Negative.   Musculoskeletal: Negative.   Skin: Negative.   Neurological: Negative.   Endo/Heme/Allergies: Negative.   Psychiatric/Behavioral: Negative.   All other systems reviewed and are negative. All to complete testing for HIV RPR   Pertinent History Reviewed:  Reviewed: Significant for CHF obesity recent 5 pound weight loss Medical         Past Medical History:  Diagnosis Date  . Adnexal cyst    Right simple cyst seen on [place]  . Anginal pain ([place])   . Anxiety   . Arthritis   . Asthma   . Atrial fibrillation (HCC)    hx of   . CHF (congestive heart failure) ([name])   . Chronic back pain   . Chronic hip pain   . COPD (chronic obstructive pulmonary disease) ([place])   . Coronary artery disease    stents placed   . Dyspnea   . GERD (gastroesophageal reflux disease)   . Headache    due to pinched nerve damaage   . Heart murmur    hx of slight murmur   . History of bronchitis   . History of cardiac catheterization    Normal coronaries 2013  . History of kidney stones   . Hot flashes   . Hypertension   . Hypertriglyceridemia   . IBS (irritable bowel syndrome)   . Internal hemorrhoids   . Morbid obesity ([place])    s/p lap band surgery  . Myocardial infarction ([name])    times 3  . Obesity   . Pulmonary HTN ([place])    [place] 2008:  RA pressures 13/10 with a mean of 7 mmHg.  RV pressure 20/25 with end diastolic pressure of 15 mmHg.  PA pressure 49/23 with a mean of 35  mmHg.  Pulmonary capillary wedge 17/50 with a mean of 14 mmHg. The PA saturation was 68%.  RA saturation was 71% and aortic saturation was 90%.  Cardiac output was 6.0 with a cardiac index of 2.80 by [place].  Normal coronaries by cath 2008.  [place] Kitchen Renal insufficiency   . Sciatica of right side   . Sleep apnea    CPAP use does not know settings   . Trauma   . Type 2 diabetes mellitus ([place])    type II   . Uterine fibroid                               Surgical Hx:    Past Surgical History:  Procedure Laterality Date  . APPENDECTOMY    . CARDIAC CATHETERIZATION  07/2007, 05/2012   Normal coronary arteries  . COLONOSCOPY  10/18/2010   SLF:2-mm sessile sigmoid polyp/diverticula, tortuous colon.  Biopsies of sigmoid colon polyp and random colon biopsies benign  . CYSTOSCOPY/URETEROSCOPY/HOLMIUM LASER/STENT PLACEMENT Right 08/01/2017   Procedure: CYSTOSCOPY/ RIGHT URETEROSCOPY/  RIGHT RETROGRADE/ HOLMIUM LASER  AND RIGHT STENT PLACEMENT FIRST STAGE;  Surgeon: Irine Seal, MD;  Location: [facility];  Service: Urology;  Laterality: Right;  . CYSTOSCOPY/URETEROSCOPY/HOLMIUM LASER/STENT PLACEMENT Right 08/08/2017   Procedure: RIGHT URETEROSCOPY WITH HOLMIUM LASER RIGHT STENT PLACEMENT;  Surgeon: Irine Seal, MD;  Location: [facility];  Service: Urology;  Laterality: Right;  . EXTRACORPOREAL SHOCK WAVE LITHOTRIPSY Right 08/22/2017   Procedure: RIGHT EXTRACORPOREAL SHOCK WAVE LITHOTRIPSY (ESWL);  Surgeon: Alexis Frock, MD;  Location: [facility];  Service: Urology;  Laterality: Right;  . HEMORRHOID BANDING  07/2012   Dr. Oneida Alar  . LAPAROSCOPIC CHOLECYSTECTOMY  1985  . LAPAROSCOPIC GASTRIC BANDING  12/12/2010  . LEFT AND RIGHT HEART CATHETERIZATION WITH CORONARY ANGIOGRAM N/A 06/03/2012   Procedure: LEFT AND RIGHT HEART CATHETERIZATION WITH CORONARY ANGIOGRAM;  Surgeon: Jolaine Artist, MD;  Location: [facility] CATH LAB;  Service: Cardiovascular;  Laterality: N/A;  . PILONIDAL CYST EXCISION  1974  . RIGHT/LEFT HEART CATH AND  CORONARY ANGIOGRAPHY N/A 03/28/2020   Procedure: RIGHT/LEFT HEART CATH AND CORONARY ANGIOGRAPHY;  Surgeon: Jolaine Artist, MD;  Location: [facility] CV LAB;  Service: Cardiovascular;  Laterality: N/A;  . 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 TUMOR EXCISION  10/2011   Face   Medications: Reviewed & Updated - see associated section                       Current Outpatient Medications:  .  albuterol (PROVENTIL HFA;VENTOLIN HFA) 108 (90 BASE) MCG/ACT inhaler, Inhale 2 puffs into the lungs every 6 (six) hours as needed for wheezing or shortness of breath. , Disp: , Rfl:  .  albuterol (PROVENTIL) (2.5 MG/3ML) 0.083% nebulizer solution, Take 2.5 mg by nebulization every 4 (four) hours as needed for wheezing or shortness of breath. , Disp: , Rfl:  .  ALPRAZolam (XANAX) 0.5 MG tablet, Take 0.5 mg by mouth daily as needed., Disp: , Rfl:  .  amLODipine (NORVASC) 10 MG tablet, Take 10 mg by mouth every evening. , Disp: , Rfl:  .  Ascorbic Acid (VITAMIN C) 1000 MG tablet, Take 1,000 mg by mouth daily., Disp: , Rfl:  .  baclofen (LIORESAL) 10 MG tablet, Take 1 tablet (10 mg total) by mouth 3 (three) times daily as needed for muscle spasms., Disp: 45 tablet, Rfl: 3 .  clobetasol (TEMOVATE) 0.05 % external solution, Apply 1 application topically daily as needed (scalp irritation). , Disp: , Rfl:  .  DERMA-SMOOTHE/FS SCALP 0.01 % OIL, APPLY TO AFFECTED AREASFOF SCALP AT BEDTIME.N, Disp: , Rfl:  .  desonide (DESOWEN) 0.05 % cream, APPLY TO AFFECTED AREASOOF EYEBROWS TWICE DAILYAAS NEEDED., Disp: , Rfl:  .  EPINEPHrine 0.3 mg/0.3 mL IJ SOAJ injection, Inject 0.3 mg into the muscle as needed for anaphylaxis. , Disp: , Rfl:  .  fluticasone (FLONASE) 50 MCG/ACT nasal spray, Place 1 spray into both nostrils daily. , Disp: , Rfl:  .  furosemide (LASIX) 40 MG tablet, Take 40 mg by mouth daily. , Disp: , Rfl:  .  gabapentin (NEURONTIN) 600 MG tablet, Take 600 mg by mouth 3 (three) times daily. , Disp: , Rfl:  .   isosorbide mononitrate (IMDUR) 30 MG 24 hr tablet, Take 30 mg by mouth daily. , Disp: , Rfl:  .  linaclotide (LINZESS) 145 MCG CAPS capsule, Take 1 capsule (145 mcg total) by mouth daily before breakfast., Disp: 30 capsule, Rfl: 5 .  loratadine (CLARITIN) 10 MG tablet,  Take 10 mg by mouth daily. , Disp: , Rfl:  .  losartan-hydrochlorothiazide (HYZAAR) 100-25 MG tablet, Take 1 tablet by mouth daily., Disp: , Rfl:  .  magnesium oxide (MAG-OX) 400 MG tablet, Take 400 mg by mouth 2 (two) times daily. , Disp: , Rfl:  .  nitroGLYCERIN (NITROSTAT) 0.4 MG SL tablet, Place 0.4 mg under the tongue every 5 (five) minutes as needed for chest pain. , Disp: , Rfl:  .  ondansetron (ZOFRAN) 4 MG tablet, Take 4 mg by mouth every 8 (eight) hours as needed for nausea or vomiting. , Disp: , Rfl:  .  oxyCODONE-acetaminophen (PERCOCET) 10-325 MG tablet, Take 1 tablet by mouth 4 (four) times daily as needed for pain. , Disp: , Rfl:  .  potassium chloride SA (K-DUR) 20 MEQ tablet, Take 20 mEq by mouth daily. , Disp: , Rfl:  .  senna-docusate (SENOKOT-S) 8.6-50 MG tablet, Take 1 tablet by mouth at bedtime as needed. , Disp: , Rfl:  .  SYMBICORT 160-4.5 MCG/ACT inhaler, Take 2 puffs by mouth 2 (two) times daily., Disp: , Rfl:  .  TRULICITY 1.5 BJ/4.7WG SOPN, Inject 1.5 mg into the skin every 7 (seven) days. , Disp: , Rfl:  .  Vitamin D, Ergocalciferol, (DRISDOL) 1.25 MG (50000 UNIT) CAPS capsule, TAKE 1 CAPSULE BY MOUTHAONCE A WEEK FOR 12 WEEKS, THEN TAKE 2000 UNITS OTC AFTER FINISHING 12TH DOSE., Disp: , Rfl:    Social History: Reviewed -  reports that she quit smoking about 23 years ago. Her smoking use included cigarettes. She has never used smokeless tobacco.  Objective Findings:  Vitals: Last menstrual period 06/06/2013.  PHYSICAL EXAMINATION General appearance - alert, well appearing, and in no distress, oriented to person, place, and time and overweight Mental status - alert, oriented to person, place, and time,  normal mood, behavior, speech, dress, motor activity, and thought processes Chest - clear to auscultation, no wheezes, rales or rhonchi, symmetric air entry Heart - normal rate and regular rhythm Abdomen - soft, nontender, nondistended, no masses or organomegaly Exam limited by body habitus Breasts -  Skin - normal coloration and turgor, no rashes, no suspicious skin lesions noted  PELVIC External genitalia -normal for age Vulva -normal for age and weight Vagina -clear secretions Cervix -small nontender.   Uterus -cannot feel  Adnexa -nontender speculum exam [place] -KOH wet prep GC chlamydia pending Rectal - rectal exam not indicated  Assessment & Plan:   A:   sti test  P:  1. gc chl trich hiv rpr 1.     By signing my name below, I, [employer], attest that this documentation has been prepared under the direction and in the presence of Jonnie Kind, MD. Electronically Signed: [place]. 07/26/20. 10:15 AM.  I personally performed the services described in this documentation, which was SCRIBED in my presence. The recorded information has been reviewed and considered accurate. It has been edited as necessary during review. Jonnie Kind, MD

## 2020-07-27 LAB — HIV ANTIBODY (ROUTINE TESTING W REFLEX): HIV Screen 4th Generation wRfx: NONREACTIVE

## 2020-07-28 DIAGNOSIS — E7849 Other hyperlipidemia: Secondary | ICD-10-CM | POA: Diagnosis not present

## 2020-07-28 LAB — CERVICOVAGINAL ANCILLARY ONLY
Chlamydia: NEGATIVE
Comment: NEGATIVE
Comment: NEGATIVE
Comment: NORMAL
Neisseria Gonorrhea: NEGATIVE
Trichomonas: NEGATIVE

## 2020-08-03 NOTE — Patient Instructions (Addendum)
Sara Tran  08/03/2020     @PREFPERIOPPHARMACY @   Your procedure is scheduled on  08/09/2020.  Report to [facility] at  0930  A.M.  Call this number if you have problems the morning of surgery:  [phone]   Remember:  Follow the diet and prep instructions given to you by Dr Ave Filter office.                      Take these medicines the morning of surgery with A SIP OF WATER  Xanax(if needed), amlodipine, baclofen, gabapentin, isosorbide, zofran(if needed), oxycodone(if needed).    Do not wear jewelry, make-up or nail polish.  Do not wear lotions, powders, or perfumes. Please wear deodorant and brush your teeth.  Do not shave 48 hours prior to surgery.  Men may shave face and neck.  Do not bring valuables to the hospital.  [facility] is not responsible for any belongings or valuables.  Contacts, dentures or bridgework may not be worn into surgery.  Leave your suitcase in the car.  After surgery it may be brought to your room.  For patients admitted to the hospital, discharge time will be determined by your treatment team.  Patients discharged the day of surgery will not be allowed to drive home.   Name and phone number of your driver:   family Special instructions:  DO NOT smoke the morning of your procedure.  Please read over the following fact sheets that you were given. Anesthesia Post-op Instructions and Care and Recovery After Surgery       Colonoscopy, Adult, Care After This sheet gives you information about how to care for yourself after your procedure. Your health care provider may also give you more specific instructions. If you have problems or questions, contact your health care provider. What can I expect after the procedure? After the procedure, it is common to have:  A small amount of blood in your stool for 24 hours after the procedure.  Some gas.  Mild cramping or bloating of your abdomen. Follow these instructions at home: Eating and  drinking   Drink enough fluid to keep your urine pale yellow.  Follow instructions from your health care provider about eating or drinking restrictions.  Resume your normal diet as instructed by your health care provider. Avoid heavy or fried foods that are hard to digest. Activity  Rest as told by your health care provider.  Avoid sitting for a long time without moving. Get up to take short walks every 1-2 hours. This is important to improve blood flow and breathing. Ask for help if you feel weak or unsteady.  Return to your normal activities as told by your health care provider. Ask your health care provider what activities are safe for you. Managing cramping and bloating   Try walking around when you have cramps or feel bloated.  Apply heat to your abdomen as told by your health care provider. Use the heat source that your health care provider recommends, such as a moist heat pack or a heating pad. ? Place a towel between your skin and the heat source. ? Leave the heat on for 20-30 minutes. ? Remove the heat if your skin turns bright red. This is especially important if you are unable to feel pain, heat, or cold. You may have a greater risk of getting burned. General instructions  For the first 24 hours after the procedure: ? Do not  drive or use machinery. ? Do not sign important documents. ? Do not drink alcohol. ? Do your regular daily activities at a slower pace than normal. ? Eat soft foods that are easy to digest.  Take over-the-counter and prescription medicines only as told by your health care provider.  Keep all follow-up visits as told by your health care provider. This is important. Contact a health care provider if:  You have blood in your stool 2-3 days after the procedure. Get help right away if you have:  More than a small spotting of blood in your stool.  Large blood clots in your stool.  Swelling of your abdomen.  Nausea or vomiting.  A  fever.  Increasing pain in your abdomen that is not relieved with medicine. Summary  After the procedure, it is common to have a small amount of blood in your stool. You may also have mild cramping and bloating of your abdomen.  For the first 24 hours after the procedure, do not drive or use machinery, sign important documents, or drink alcohol.  Get help right away if you have a lot of blood in your stool, nausea or vomiting, a fever, or increased pain in your abdomen. This information is not intended to replace advice given to you by your health care provider. Make sure you discuss any questions you have with your health care provider. Document Revised: 07/13/2019 Document Reviewed: 07/13/2019 Elsevier Patient Education  [place] After These instructions provide you with information about caring for yourself after your procedure. Your health care provider may also give you more specific instructions. Your treatment has been planned according to current medical practices, but problems sometimes occur. Call your health care provider if you have any problems or questions after your procedure. What can I expect after the procedure? After your procedure, you may:  Feel sleepy for several hours.  Feel clumsy and have poor balance for several hours.  Feel forgetful about what happened after the procedure.  Have poor judgment for several hours.  Feel nauseous or vomit.  Have a sore throat if you had a breathing tube during the procedure. Follow these instructions at home: For at least 24 hours after the procedure:      Have a responsible adult stay with you. It is important to have someone help care for you until you are awake and alert.  Rest as needed.  Do not: ? Participate in activities in which you could fall or become injured. ? Drive. ? Use heavy machinery. ? Drink alcohol. ? Take sleeping pills or medicines that cause  drowsiness. ? Make important decisions or sign legal documents. ? Take care of children on your own. Eating and drinking  Follow the diet that is recommended by your health care provider.  If you vomit, drink water, juice, or soup when you can drink without vomiting.  Make sure you have little or no nausea before eating solid foods. General instructions  Take over-the-counter and prescription medicines only as told by your health care provider.  If you have sleep apnea, surgery and certain medicines can increase your risk for breathing problems. Follow instructions from your health care provider about wearing your sleep device: ? Anytime you are sleeping, including during daytime naps. ? While taking prescription pain medicines, sleeping medicines, or medicines that make you drowsy.  If you smoke, do not smoke without supervision.  Keep all follow-up visits as told by your health care  provider. This is important. Contact a health care provider if:  You keep feeling nauseous or you keep vomiting.  You feel light-headed.  You develop a rash.  You have a fever. Get help right away if:  You have trouble breathing. Summary  For several hours after your procedure, you may feel sleepy and have poor judgment.  Have a responsible adult stay with you for at least 24 hours or until you are awake and alert. This information is not intended to replace advice given to you by your health care provider. Make sure you discuss any questions you have with your health care provider. Document Revised: 03/17/2018 Document Reviewed: 04/08/2016 Elsevier Patient Education  [place].

## 2020-08-05 ENCOUNTER — Encounter (HOSPITAL_COMMUNITY): Payer: Self-pay

## 2020-08-05 ENCOUNTER — Other Ambulatory Visit (HOSPITAL_COMMUNITY)
Admission: RE | Admit: 2020-08-05 | Discharge: 2020-08-05 | Disposition: A | Discharge status: Home / Self Care | Payer: Medicare HMO | Source: Ambulatory Visit | Attending: Internal Medicine | Admitting: Internal Medicine

## 2020-08-05 ENCOUNTER — Other Ambulatory Visit: Payer: Self-pay

## 2020-08-05 ENCOUNTER — Encounter (HOSPITAL_COMMUNITY)
Admission: RE | Admit: 2020-08-05 | Discharge: 2020-08-05 | Disposition: A | Discharge status: Home / Self Care | Payer: Medicare HMO | Source: Ambulatory Visit | Attending: Internal Medicine | Admitting: Internal Medicine

## 2020-08-05 DIAGNOSIS — Z20822 Contact with and (suspected) exposure to covid-19: Secondary | ICD-10-CM | POA: Insufficient documentation

## 2020-08-05 DIAGNOSIS — Z01812 Encounter for preprocedural laboratory examination: Secondary | ICD-10-CM | POA: Diagnosis not present

## 2020-08-05 LAB — SARS CORONAVIRUS 2 (TAT 6-24 HRS): SARS Coronavirus 2: NEGATIVE

## 2020-08-05 LAB — BASIC METABOLIC PANEL
Anion gap: 12 (ref 5–15)
BUN: 29 mg/dL — ABNORMAL HIGH (ref 8–23)
CO2: 30 mmol/L (ref 22–32)
Calcium: 9.6 mg/dL (ref 8.9–10.3)
Chloride: 102 mmol/L (ref 98–111)
Creatinine, Ser: 1.61 mg/dL — ABNORMAL HIGH (ref 0.44–1.00)
GFR calc Af Amer: 39 mL/min — ABNORMAL LOW (ref >60)
GFR calc non Af Amer: 34 mL/min — ABNORMAL LOW (ref >60)
Glucose, Bld: 111 mg/dL — ABNORMAL HIGH (ref 70–99)
Potassium: 3.8 mmol/L (ref 3.5–5.1)
Sodium: 144 mmol/L (ref 135–145)

## 2020-08-09 ENCOUNTER — Encounter (HOSPITAL_COMMUNITY)
Admission: RE | Disposition: A | Discharge status: Home / Self Care | Payer: Self-pay | Source: Home / Self Care | Attending: Internal Medicine

## 2020-08-09 ENCOUNTER — Other Ambulatory Visit: Payer: Self-pay

## 2020-08-09 ENCOUNTER — Encounter (HOSPITAL_COMMUNITY): Payer: Self-pay | Admitting: *Deleted

## 2020-08-09 ENCOUNTER — Ambulatory Visit (HOSPITAL_COMMUNITY)
Admission: RE | Admit: 2020-08-09 | Discharge: 2020-08-09 | Disposition: A | Discharge status: Home / Self Care | Payer: Medicare HMO | Attending: Internal Medicine | Admitting: Internal Medicine

## 2020-08-09 ENCOUNTER — Ambulatory Visit (HOSPITAL_COMMUNITY): Payer: Medicare HMO | Admitting: Anesthesiology

## 2020-08-09 DIAGNOSIS — K5900 Constipation, unspecified: Secondary | ICD-10-CM | POA: Diagnosis not present

## 2020-08-09 DIAGNOSIS — I509 Heart failure, unspecified: Secondary | ICD-10-CM | POA: Diagnosis not present

## 2020-08-09 DIAGNOSIS — E119 Type 2 diabetes mellitus without complications: Secondary | ICD-10-CM | POA: Insufficient documentation

## 2020-08-09 DIAGNOSIS — G4730 Sleep apnea, unspecified: Secondary | ICD-10-CM | POA: Diagnosis not present

## 2020-08-09 DIAGNOSIS — E781 Pure hyperglyceridemia: Secondary | ICD-10-CM | POA: Diagnosis not present

## 2020-08-09 DIAGNOSIS — I4891 Unspecified atrial fibrillation: Secondary | ICD-10-CM | POA: Diagnosis not present

## 2020-08-09 DIAGNOSIS — K219 Gastro-esophageal reflux disease without esophagitis: Secondary | ICD-10-CM | POA: Insufficient documentation

## 2020-08-09 DIAGNOSIS — Z79899 Other long term (current) drug therapy: Secondary | ICD-10-CM | POA: Diagnosis not present

## 2020-08-09 DIAGNOSIS — K589 Irritable bowel syndrome without diarrhea: Secondary | ICD-10-CM | POA: Diagnosis not present

## 2020-08-09 DIAGNOSIS — Z538 Procedure and treatment not carried out for other reasons: Secondary | ICD-10-CM | POA: Diagnosis not present

## 2020-08-09 DIAGNOSIS — Z87891 Personal history of nicotine dependence: Secondary | ICD-10-CM | POA: Diagnosis not present

## 2020-08-09 DIAGNOSIS — I252 Old myocardial infarction: Secondary | ICD-10-CM | POA: Diagnosis not present

## 2020-08-09 DIAGNOSIS — R1030 Lower abdominal pain, unspecified: Secondary | ICD-10-CM | POA: Insufficient documentation

## 2020-08-09 DIAGNOSIS — I110 Hypertensive heart disease with heart failure: Secondary | ICD-10-CM | POA: Diagnosis not present

## 2020-08-09 DIAGNOSIS — Z955 Presence of coronary angioplasty implant and graft: Secondary | ICD-10-CM | POA: Insufficient documentation

## 2020-08-09 DIAGNOSIS — Z7951 Long term (current) use of inhaled steroids: Secondary | ICD-10-CM | POA: Insufficient documentation

## 2020-08-09 DIAGNOSIS — Z1211 Encounter for screening for malignant neoplasm of colon: Secondary | ICD-10-CM | POA: Diagnosis not present

## 2020-08-09 DIAGNOSIS — I2510 Atherosclerotic heart disease of native coronary artery without angina pectoris: Secondary | ICD-10-CM | POA: Insufficient documentation

## 2020-08-09 DIAGNOSIS — J449 Chronic obstructive pulmonary disease, unspecified: Secondary | ICD-10-CM | POA: Diagnosis not present

## 2020-08-09 HISTORY — PX: COLONOSCOPY WITH PROPOFOL: SHX5780

## 2020-08-09 LAB — GLUCOSE, CAPILLARY
Glucose-Capillary: 92 mg/dL (ref 70–99)
Glucose-Capillary: 96 mg/dL (ref 70–99)

## 2020-08-09 SURGERY — COLONOSCOPY WITH PROPOFOL
Anesthesia: General

## 2020-08-09 MED ORDER — CHLORHEXIDINE GLUCONATE CLOTH 2 % EX PADS: 6.0000 | MEDICATED_PAD | Freq: Once | CUTANEOUS | Status: DC

## 2020-08-09 MED ORDER — PROPOFOL 10 MG/ML IV BOLUS
INTRAVENOUS | Status: AC
  Filled 2020-08-09: qty 20

## 2020-08-09 MED ORDER — LACTATED RINGERS IV SOLN: INTRAVENOUS | Status: DC

## 2020-08-09 MED ORDER — KETAMINE HCL 50 MG/5ML IJ SOSY
PREFILLED_SYRINGE | INTRAMUSCULAR | Status: AC
  Filled 2020-08-09: qty 5

## 2020-08-09 MED ORDER — PROPOFOL 10 MG/ML IV BOLUS
INTRAVENOUS | Status: DC | PRN
  Administered 2020-08-09 (×3): 20 mg via INTRAVENOUS

## 2020-08-09 MED ORDER — STERILE WATER FOR IRRIGATION IR SOLN
Status: DC | PRN
  Administered 2020-08-09: 1.5 mL

## 2020-08-09 MED ORDER — KETAMINE HCL 10 MG/ML IJ SOLN
INTRAMUSCULAR | Status: DC | PRN
  Administered 2020-08-09: 20 mg via INTRAVENOUS

## 2020-08-09 NOTE — Anesthesia Postprocedure Evaluation (Signed)
Anesthesia Post Note  Patient: Sara Tran  Procedure(s) Performed: COLONOSCOPY WITH PROPOFOL (N/A )  Patient location during evaluation: PACU Anesthesia Type: General Level of consciousness: awake and alert Pain management: pain level controlled Vital Signs Assessment: post-procedure vital signs reviewed and stable Respiratory status: spontaneous breathing, nonlabored ventilation, respiratory function stable and patient connected to nasal cannula oxygen Cardiovascular status: stable and blood pressure returned to baseline Postop Assessment: no apparent nausea or vomiting Anesthetic complications: no   No complications documented.   Last Vitals:  Vitals:   08/09/20 1144 08/09/20 1145  BP:  132/80  Pulse: 74   Resp: 14   Temp: 37.1 C   SpO2: 98%     Last Pain:  Vitals:   08/09/20 1144  TempSrc: Oral  PainSc:                  Amiliana Foutz

## 2020-08-09 NOTE — Discharge Instructions (Addendum)
Colonoscopy, Adult, Care After This sheet gives you information about how to care for yourself after your procedure. Your health care provider may also give you more specific instructions. If you have problems or questions, contact your health care provider. What can I expect after the procedure? After the procedure, it is common to have:  A small amount of blood in your stool for 24 hours after the procedure.  Some gas.  Mild cramping or bloating of your abdomen. Follow these instructions at home: Eating and drinking   Drink enough fluid to keep your urine pale yellow.  Follow instructions from your health care provider about eating or drinking restrictions.  Resume your normal diet as instructed by your health care provider. Avoid heavy or fried foods that are hard to digest. Activity  Rest as told by your health care provider.  Avoid sitting for a long time without moving. Get up to take short walks every 1-2 hours. This is important to improve blood flow and breathing. Ask for help if you feel weak or unsteady.  Return to your normal activities as told by your health care provider. Ask your health care provider what activities are safe for you. Managing cramping and bloating   Try walking around when you have cramps or feel bloated.  Apply heat to your abdomen as told by your health care provider. Use the heat source that your health care provider recommends, such as a moist heat pack or a heating pad. ? Place a towel between your skin and the heat source. ? Leave the heat on for 20-30 minutes. ? Remove the heat if your skin turns bright red. This is especially important if you are unable to feel pain, heat, or cold. You may have a greater risk of getting burned. General instructions  For the first 24 hours after the procedure: ? Do not drive or use machinery. ? Do not sign important documents. ? Do not drink alcohol. ? Do your regular daily activities at a slower pace  than normal. ? Eat soft foods that are easy to digest.  Take over-the-counter and prescription medicines only as told by your health care provider.  Keep all follow-up visits as told by your health care provider. This is important. Contact a health care provider if:  You have blood in your stool 2-3 days after the procedure. Get help right away if you have:  More than a small spotting of blood in your stool.  Large blood clots in your stool.  Swelling of your abdomen.  Nausea or vomiting.  A fever.  Increasing pain in your abdomen that is not relieved with medicine. Summary  After the procedure, it is common to have a small amount of blood in your stool. You may also have mild cramping and bloating of your abdomen.  For the first 24 hours after the procedure, do not drive or use machinery, sign important documents, or drink alcohol.  Get help right away if you have a lot of blood in your stool, nausea or vomiting, a fever, or increased pain in your abdomen. This information is not intended to replace advice given to you by your health care provider. Make sure you discuss any questions you have with your health care provider. Document Revised: 07/13/2019 Document Reviewed: 07/13/2019 Elsevier Patient Education  [place] post op instructions for colonoscopy  Colon was not prepped. Repeat colonoscopy at earliest convenience with 2 day extended prep.  Follow up with GI after repeat colonoscopy

## 2020-08-09 NOTE — Op Note (Signed)
[facility] Patient Name: Sara Tran Procedure Date: 08/09/2020 10:00 AM MRN: [id number] Date of Birth: [birth year] Attending MD: Elon Alas. Edgar Frisk CSN: [id number] Age: 64 Admit Type: Outpatient Procedure:                Colonoscopy Indications:              Lower abdominal pain, Constipation Providers:                Elon Alas. Deandra Goering, DO, [place] B. Sharon Seller, RN,                            Caprice Kluver, Aram Candela Referring MD:              Medicines:                See the Anesthesia note for documentation of the                            administered medications Complications:            No immediate complications. Estimated Blood Loss:     Estimated blood loss: none. Procedure:                Pre-Anesthesia Assessment:                           - The anesthesia plan was to use monitored                            anesthesia care (MAC).                           After obtaining informed consent, the colonoscope                            was passed under direct vision. Throughout the                            procedure, the patient's blood pressure, pulse, and                            oxygen saturations were monitored continuously. The                            PCF-[id number] ([id number]) was introduced through the                            anus with the intention of advancing to the cecum.                            The scope was advanced to the ascending colon                            before the procedure was aborted. Medications were                            given. The colonoscopy  was performed without                            difficulty. The patient tolerated the procedure                            well. The quality of the bowel preparation was                            evaluated using the BBPS [facility] Bowel Preparation                            Scale) with scores of: Right Colon = 1 (portion of                            mucosa seen, but other areas not well  seen due to                            staining, residual stool and/or opaque liquid),                            Transverse Colon = 1 (portion of mucosa seen, but                            other areas not well seen due to staining, residual                            stool and/or opaque liquid) and Left Colon = 1                            (portion of mucosa seen, but other areas not well                            seen due to staining, residual stool and/or opaque                            liquid). The total BBPS score equals 3. The quality                            of the bowel preparation was inadequate. Scope In: 10:26:24 AM Scope Out: 10:30:35 AM Scope Withdrawal Time: 0 hours 0 minutes 14 seconds  Total Procedure Duration: 0 hours 4 minutes 11 seconds  Findings:      The perianal and digital rectal examinations were normal.      Extensive amounts of stool was found in the entire colon, precluding       visualization. Impression:               - Preparation of the colon was inadequate.                           - Stool in the entire examined colon.                           -  No specimens collected. Moderate Sedation:      Per Anesthesia Care Recommendation:           - Patient has a contact number available for                            emergencies. The signs and symptoms of potential                            delayed complications were discussed with the                            patient. Return to normal activities tomorrow.                            Written discharge instructions were provided to the                            patient.                           - Resume previous diet.                           - Continue present medications.                           - Repeat colonoscopy at next available appointment                            (within 3 months) because the bowel preparation was                            poor. Procedure Code(s):        ---  Professional ---                           [phone], 67, Colonoscopy, flexible; diagnostic,                            including collection of specimen(s) by brushing or                            washing, when performed (separate procedure) Diagnosis Code(s):        --- Professional ---                           R10.30, Lower abdominal pain, unspecified                           K59.00, Constipation, unspecified CPT copyright 2019 American Medical Association. All rights reserved. The codes documented in this report are preliminary and upon coder review may  be revised to meet current compliance requirements. Elon Alas. Abbey Chatters, [place] Admir Candelas, DO 08/09/2020 10:34:15 AM This report has been signed electronically. Number of Addenda: 0

## 2020-08-09 NOTE — Transfer of Care (Signed)
Immediate Anesthesia Transfer of Care Note  Patient: Sara Tran  Procedure(s) Performed: COLONOSCOPY WITH PROPOFOL (N/A )  Patient Location: PACU  Anesthesia Type:MAC  Level of Consciousness: awake, alert , oriented and patient cooperative  Airway & Oxygen Therapy: Patient Spontanous Breathing and Patient connected to nasal cannula oxygen  Post-op Assessment: Report given to RN, Post -op Vital signs reviewed and stable and Patient moving all extremities X 4  Post vital signs: Reviewed and stable  Last Vitals:  Vitals Value Taken Time  BP    Temp    Pulse 80 08/09/20 1037  Resp 11 08/09/20 1037  SpO2 100 % 08/09/20 1037  Vitals shown include unvalidated device data.  Last Pain:  Vitals:   08/09/20 1021  TempSrc:   PainSc: 0-No pain      Patients Stated Pain Goal: 8 (47/20/72 1828)  Complications: No complications documented.

## 2020-08-09 NOTE — Anesthesia Preprocedure Evaluation (Signed)
Anesthesia Evaluation  Patient identified by MRN, date of birth, ID band Patient awake    Reviewed: Allergy & Precautions, H&P , NPO status , Patient's Chart, lab work & pertinent test results, reviewed documented beta blocker date and time   Airway Mallampati: III  TM Distance: >3 FB Neck ROM: full    Dental no notable dental hx.    Pulmonary shortness of breath, asthma , sleep apnea , pneumonia, resolved, COPD, former smoker,    Pulmonary exam normal breath sounds clear to auscultation       Cardiovascular Exercise Tolerance: Good hypertension, + angina + CAD, + Past MI and +CHF  + Valvular Problems/Murmurs  Rhythm:regular Rate:Normal     Neuro/Psych  Headaches, Anxiety  Neuromuscular disease negative psych ROS   GI/Hepatic Neg liver ROS, GERD  Medicated,  Endo/Other  negative endocrine ROSdiabetes  Renal/GU negative Renal ROS  negative genitourinary   Musculoskeletal   Abdominal   Peds  Hematology negative hematology ROS (+)   Anesthesia Other Findings   Reproductive/Obstetrics negative OB ROS                             Anesthesia Physical Anesthesia Plan  ASA: III  Anesthesia Plan: General   Post-op Pain Management:    Induction:   PONV Risk Score and Plan: 3  Airway Management Planned:   Additional Equipment:   Intra-op Plan:   Post-operative Plan:   Informed Consent: I have reviewed the patients History and Physical, chart, labs and discussed the procedure including the risks, benefits and alternatives for the proposed anesthesia with the patient or authorized representative who has indicated his/her understanding and acceptance.     Dental Advisory Given  Plan Discussed with: CRNA  Anesthesia Plan Comments:         Anesthesia Quick Evaluation

## 2020-08-09 NOTE — H&P (Signed)
Referring Provider: No ref. provider found Primary Care Physician:  Sharilyn Sites, MD  HPI:  Sara Tran is a 64 y.o. female who presents for outpatient colonoscopy due to history of Lower abdominal pain Bloating Constipation  Past Medical History:  Diagnosis Date  . Adnexal cyst    Right simple cyst seen on [place]  . Anginal pain ([place])   . Anxiety   . Arthritis   . Asthma   . Atrial fibrillation (HCC)    hx of   . CHF (congestive heart failure) ([place])   . Chronic back pain   . Chronic hip pain   . COPD (chronic obstructive pulmonary disease) ([place])   . Coronary artery disease    stents placed   . Dyspnea   . GERD (gastroesophageal reflux disease)   . Headache    due to pinched nerve damaage   . Heart murmur    hx of slight murmur   . History of bronchitis   . History of cardiac catheterization    Normal coronaries 2013  . History of kidney stones   . Hot flashes   . Hypertension   . Hypertriglyceridemia   . IBS (irritable bowel syndrome)   . Internal hemorrhoids   . Morbid obesity ([place])    s/p lap band surgery  . Myocardial infarction ([place]) 12/2018   times 3  . Obesity   . Pulmonary HTN ([place])    [place] 2008:  RA pressures 13/10 with a mean of 7 mmHg.  RV pressure 34/19 with end diastolic pressure of 15 mmHg.  PA pressure 49/23 with a mean of 35 mmHg.  Pulmonary capillary wedge 17/50 with a mean of 14 mmHg. The PA saturation was 68%.  RA saturation was 71% and aortic saturation was 90%.  Cardiac output was 6.0 with a cardiac index of 2.80 by Fick.  Normal coronaries by cath 2008.  Marland Kitchen Renal insufficiency   . Sciatica of right side   . Sleep apnea    CPAP use does not know settings   . Trauma   . Type 2 diabetes mellitus ([place])    type II   . Uterine fibroid     Past Surgical History:  Procedure Laterality Date  . accupuncture    . APPENDECTOMY    . CARDIAC CATHETERIZATION  07/2007, 05/2012   Normal coronary arteries  . COLONOSCOPY  10/18/2010   SLF:2-mm sessile  sigmoid polyp/diverticula, tortuous colon.  Biopsies of sigmoid colon polyp and random colon biopsies benign  . CYSTOSCOPY/URETEROSCOPY/HOLMIUM LASER/STENT PLACEMENT Right 08/01/2017   Procedure: CYSTOSCOPY/ RIGHT URETEROSCOPY/ RIGHT RETROGRADE/ HOLMIUM LASER  AND RIGHT STENT PLACEMENT FIRST STAGE;  Surgeon: Irine Seal, MD;  Location: [facility];  Service: Urology;  Laterality: Right;  . CYSTOSCOPY/URETEROSCOPY/HOLMIUM LASER/STENT PLACEMENT Right 08/08/2017   Procedure: RIGHT URETEROSCOPY WITH HOLMIUM LASER RIGHT STENT PLACEMENT;  Surgeon: Irine Seal, MD;  Location: [facility];  Service: Urology;  Laterality: Right;  . EXTRACORPOREAL SHOCK WAVE LITHOTRIPSY Right 08/22/2017   Procedure: RIGHT EXTRACORPOREAL SHOCK WAVE LITHOTRIPSY (ESWL);  Surgeon: Alexis Frock, MD;  Location: [facility];  Service: Urology;  Laterality: Right;  . HEMORRHOID BANDING  07/2012   Dr. Oneida Alar  . LAPAROSCOPIC CHOLECYSTECTOMY  1985  . LAPAROSCOPIC GASTRIC BANDING  12/12/2010  . LEFT AND RIGHT HEART CATHETERIZATION WITH CORONARY ANGIOGRAM N/A 06/03/2012   Procedure: LEFT AND RIGHT HEART CATHETERIZATION WITH CORONARY ANGIOGRAM;  Surgeon: Jolaine Artist, MD;  Location: [facility] CATH LAB;  Service: Cardiovascular;  Laterality: N/A;  . PILONIDAL CYST  EXCISION  1974  . RIGHT/LEFT HEART CATH AND CORONARY ANGIOGRAPHY N/A 03/28/2020   Procedure: RIGHT/LEFT HEART CATH AND CORONARY ANGIOGRAPHY;  Surgeon: Jolaine Artist, MD;  Location: [facility] CV LAB;  Service: Cardiovascular;  Laterality: N/A;  . 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 TUMOR EXCISION  10/2011   Face    Prior to Admission medications   Medication Sig Start Date End Date Taking? Authorizing Provider  albuterol (PROVENTIL HFA;VENTOLIN HFA) 108 (90 BASE) MCG/ACT inhaler Inhale 2 puffs into the lungs every 6 (six) hours as needed for wheezing or shortness of breath.    Yes [provider]  albuterol (PROVENTIL) (2.5 MG/3ML) 0.083% nebulizer solution Take 2.5 mg by  nebulization every 4 (four) hours as needed for wheezing or shortness of breath.  09/19/15  Yes [provider]  ALPRAZolam Duanne Moron) 0.5 MG tablet Take 0.5 mg by mouth daily as needed for anxiety.  06/08/20  Yes [provider]  amLODipine (NORVASC) 10 MG tablet Take 10 mg by mouth every evening.  04/10/13  Yes [provider]  Ascorbic Acid (VITAMIN C) 500 MG CAPS Take 500 mg by mouth daily.    Yes [provider]  baclofen (LIORESAL) 10 MG tablet Take 1 tablet (10 mg total) by mouth 3 (three) times daily as needed for muscle spasms. 08/13/18  Yes Meredith Staggers, MD  clobetasol (TEMOVATE) 0.05 % external solution Apply 1 application topically daily as needed (scalp irritation).  04/15/19  Yes [provider]  DERMA-SMOOTHE/FS SCALP 0.01 % OIL Apply 1 application topically at bedtime.  04/20/20  Yes [provider]  desonide (DESOWEN) 0.05 % cream Apply 1 application topically 2 (two) times daily as needed (irritation).  04/20/20  Yes [provider]  EPINEPHrine 0.3 mg/0.3 mL IJ SOAJ injection Inject 0.3 mg into the muscle as needed for anaphylaxis.  05/27/19  Yes [provider]  fluticasone (FLONASE) 50 MCG/ACT nasal spray Place 1 spray into both nostrils daily.  06/07/20  Yes [provider]  furosemide (LASIX) 40 MG tablet Take 40 mg by mouth daily.  06/24/19  Yes [provider]  gabapentin (NEURONTIN) 600 MG tablet Take 600 mg by mouth 3 (three) times daily.  06/12/19  Yes [provider]  isosorbide mononitrate (IMDUR) 30 MG 24 hr tablet Take 30 mg by mouth daily.  09/05/11  Yes [provider]  linaclotide (LINZESS) 72 MCG capsule Take 72 mcg by mouth daily before breakfast.   Yes [provider]  loratadine (CLARITIN) 10 MG tablet Take 10 mg by mouth daily.  05/27/17  Yes [provider]  losartan-hydrochlorothiazide (HYZAAR) 100-25 MG tablet Take 1 tablet by mouth daily.   Yes  [provider]  magnesium oxide (MAG-OX) 400 MG tablet Take 400 mg by mouth daily.    Yes [provider]  Multiple Vitamin (MULTIVITAMIN WITH MINERALS) TABS tablet Take 1 tablet by mouth daily.   Yes [provider]  nitroGLYCERIN (NITROSTAT) 0.4 MG SL tablet Place 0.4 mg under the tongue every 5 (five) minutes as needed for chest pain.    Yes [provider]  ondansetron (ZOFRAN) 4 MG tablet Take 4 mg by mouth every 8 (eight) hours as needed for nausea or vomiting.  11/05/19  Yes [provider]  oxyCODONE-acetaminophen (PERCOCET) 10-325 MG tablet Take 1 tablet by mouth 4 (four) times daily as needed for pain.  12/06/18  Yes [provider]  potassium chloride SA (K-DUR)  20 MEQ tablet Take 20 mEq by mouth daily.  06/12/19  Yes [provider]  SYMBICORT 160-4.5 MCG/ACT inhaler Take 2 puffs by mouth 2 (two) times daily. 06/17/19  Yes [provider]  TRULICITY 1.5 FM/3.8GY SOPN Inject 1.5 mg into the skin every 7 (seven) days.  01/03/19  Yes [provider]  vitamin B-12 (CYANOCOBALAMIN) 1000 MCG tablet Take 1,000 mcg by mouth daily.   Yes [provider]  Vitamin D, Ergocalciferol, (DRISDOL) 1.25 MG (50000 UNIT) CAPS capsule Take 50,000 Units by mouth every 7 (seven) days.  06/09/20  Yes [provider]  linaclotide Rolan Lipa) 145 MCG CAPS capsule Take 1 capsule (145 mcg total) by mouth daily before breakfast. Patient not taking: Reported on 07/27/2020 07/06/20   Mahala Menghini, PA-C    No current facility-administered medications for this encounter.    Allergies as of 07/11/2020 - Review Complete 07/06/2020  Allergen Reaction Noted  . Ancef [cefazolin] Anaphylaxis and Shortness Of Breath 07/23/2017  . Bee venom Anaphylaxis 05/04/2017  . Meloxicam  03/16/2020  . Ceftin [cefuroxime axetil] Swelling 10/06/2013  . Chicken meat (diagnostic) Nausea And Vomiting 03/16/2020  . Ciprofloxacin Cough 09/27/2011   . Macrodantin [nitrofurantoin] Other (See Comments) 07/23/2017  . Meat [alpha-gal] Nausea And Vomiting 03/16/2020  . Milk-related compounds Other (See Comments) 11/21/2016  . Nsaids Other (See Comments) 03/16/2020  . Other  07/23/2017  . Latex Itching and Rash 04/22/2012    Family History  Problem Relation Age of Onset  . Diabetes Mother   . Heart failure Mother   . Breast cancer Sister   . CAD Father   . Hypertension Father   . Heart failure Father   . Cancer Paternal Grandfather   . Hypertension Paternal Grandmother   . Stroke Paternal Grandmother   . Other Maternal Grandmother        tonsilitis  . Diabetes Maternal Grandfather   . Hypertension Maternal Grandfather   . Breast cancer Paternal Aunt     Social History   Socioeconomic History  . Marital status: Single    Spouse name: Not on file  . Number of children: 0  . Years of education: Not on file  . Highest education level: Not on file  Occupational History  . Occupation: Volunteers  . 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 Smoking status: Former Smoker    Types: Cigarettes    Quit date: 08/08/1996    Years since quitting: 24.0  . Smokeless tobacco: Never Used  Vaping Use  . Vaping Use: Never used  Substance and Sexual Activity  . Alcohol use: No  . Drug use: No  . Sexual activity: Yes    Birth control/protection: Post-menopausal  Other Topics Concern  . Not on file  Social History Narrative  . Not on file   Social Determinants of Health   Financial Resource Strain: Unknown  . Difficulty of Paying Living Expenses: Patient refused  Food Insecurity: Food Insecurity Present  . Worried About Charity fundraiser in the Last Year: Often true  . Ran Out of Food in the Last Year: Sometimes true  Transportation Needs: Unknown  . Lack of Transportation (Medical): Patient refused  . Lack of Transportation (Non-Medical): Patient refused  Physical Activity:  Insufficiently Active  . Days of Exercise per Week: 1 day  . Minutes of Exercise per Session: 10 min  Stress: Unknown  . Feeling of Stress : Patient refused  Social Connections: Unknown  . Frequency of Communication with Friends and Family: More than three times a week  . Frequency of Social Gatherings with Friends and Family: Patient refused  . Attends Religious Services: Patient refused  . Active Member of Clubs or Organizations: Yes  . Attends Archivist Meetings: Patient refused  . Marital Status: Patient refused  Intimate Partner Violence: Unknown  . Fear of Current or Ex-Partner: Patient refused  . Emotionally Abused: Patient refused  . Physically Abused: Patient refused  . Sexually Abused: Patient refused    Review of Systems: General: Negative for anorexia, weight loss, fever, chills, fatigue, weakness. Eyes: Negative for vision changes.  ENT: Negative for hoarseness, difficulty swallowing , nasal congestion. CV: Negative for chest pain, angina, palpitations, dyspnea on exertion, peripheral edema.  Respiratory: Negative for dyspnea at rest, dyspnea on exertion, cough, sputum, wheezing.  GI: See history of present illness. GU:  Negative for dysuria, hematuria, urinary incontinence, urinary frequency, nocturnal urination.  MS: Negative for joint pain, low back pain.  Derm: Negative for rash or itching.  Neuro: Negative for weakness, abnormal sensation, seizure, frequent headaches, memory loss, confusion.  Psych: Negative for anxiety, depression, suicidal ideation, hallucinations.  Endo: Negative for unusual weight change.  Heme: Negative for bruising or bleeding. Allergy: Negative for rash or hives.  Physical Exam: Vital signs in last 24 hours:     General:   Alert,  Well-developed, well-nourished, pleasant and cooperative in NAD Head:  Normocephalic and atraumatic. Eyes:  Sclera clear, no icterus.   Conjunctiva pink. Ears:  Normal auditory acuity. Nose:  No  deformity, discharge,  or lesions. Mouth:  No deformity or lesions, dentition normal. Neck:  Supple; no masses or thyromegaly. Lungs:  Clear throughout to auscultation.   No wheezes, crackles, or rhonchi. No acute distress. Heart:  Regular rate and rhythm; no murmurs, clicks, rubs,  or gallops. Abdomen:  Soft, nontender and nondistended. No masses, hepatosplenomegaly or hernias noted. Normal bowel sounds, without guarding, and without rebound.   Rectal:  Deferred until time of colonoscopy.   Msk:  Symmetrical without gross deformities. Normal posture. Pulses:  Normal pulses noted. Extremities:  Without clubbing or edema. Neurologic:  Alert and  oriented x4;  grossly normal neurologically. Skin:  Intact without significant lesions or rashes. Cervical Nodes:  No significant cervical adenopathy. Psych:  Alert and cooperative. Normal mood and affect.  Intake/Output from previous day: No intake/output data recorded. Intake/Output this shift: No intake/output data recorded.  Lab Results: No results for input(s): WBC, HGB, HCT, PLT in the last 72 hours. BMET No results for input(s): NA, K, CL, CO2, GLUCOSE, BUN, CREATININE, CALCIUM in the last 72 hours. LFT No results for input(s): PROT, ALBUMIN, AST, ALT, ALKPHOS, BILITOT, BILIDIR, IBILI in the last 72 hours. PT/INR No results for input(s): LABPROT, INR in the last 72 hours. Hepatitis Panel No results for input(s): HEPBSAG, HCVAB, HEPAIGM, HEPBIGM in the last 72 hours. C-Diff No results for input(s): CDIFFTOX in the last 72 hours.  Studies/Results: No results found.  Impression: Lower abdominal pain Bloating Constipation  Plan: Proceed with colonoscopy    LOS: 0 days     08/09/2020, 9:28 AM

## 2020-08-10 ENCOUNTER — Telehealth: Payer: Self-pay

## 2020-08-10 NOTE — Telephone Encounter (Signed)
Sara Tran, please schedule OV prior to rescheduling TCS.

## 2020-08-10 NOTE — Telephone Encounter (Signed)
Sara Tran at endo left message on VM yesterday. Pt wasn't cleaned out for TCS. She will need repeat TCS. No upcoming appt. [name], does pt need another OV to get TCS rescheduled?

## 2020-08-10 NOTE — Telephone Encounter (Signed)
Yes she needs OV because we need to find out why she failed prep and provide appropriate changes in her bowel regimen. She is also ASA III with numerous comorbidites.

## 2020-08-12 ENCOUNTER — Encounter (HOSPITAL_COMMUNITY): Payer: Self-pay | Admitting: Internal Medicine

## 2020-08-17 ENCOUNTER — Telehealth: Payer: Self-pay | Admitting: Internal Medicine

## 2020-08-17 ENCOUNTER — Encounter: Payer: Self-pay | Admitting: Internal Medicine

## 2020-08-17 NOTE — Telephone Encounter (Signed)
OV made and letter mailed °

## 2020-08-18 ENCOUNTER — Encounter: Payer: Self-pay | Admitting: Physical Medicine & Rehabilitation

## 2020-08-18 ENCOUNTER — Encounter: Payer: Medicare HMO | Attending: Physical Medicine & Rehabilitation | Admitting: Physical Medicine & Rehabilitation

## 2020-08-18 ENCOUNTER — Other Ambulatory Visit: Payer: Self-pay

## 2020-08-18 VITALS — BP 111/70 | HR 87 | Temp 98.4°F | Ht 60.0 in | Wt 277.0 lb

## 2020-08-18 DIAGNOSIS — E669 Obesity, unspecified: Secondary | ICD-10-CM | POA: Diagnosis not present

## 2020-08-18 DIAGNOSIS — M797 Fibromyalgia: Secondary | ICD-10-CM | POA: Diagnosis not present

## 2020-08-18 DIAGNOSIS — K581 Irritable bowel syndrome with constipation: Secondary | ICD-10-CM | POA: Diagnosis not present

## 2020-08-18 DIAGNOSIS — M25531 Pain in right wrist: Secondary | ICD-10-CM | POA: Diagnosis not present

## 2020-08-18 DIAGNOSIS — R202 Paresthesia of skin: Secondary | ICD-10-CM

## 2020-08-18 DIAGNOSIS — G5601 Carpal tunnel syndrome, right upper limb: Secondary | ICD-10-CM

## 2020-08-18 DIAGNOSIS — M5416 Radiculopathy, lumbar region: Secondary | ICD-10-CM | POA: Insufficient documentation

## 2020-08-18 DIAGNOSIS — M47816 Spondylosis without myelopathy or radiculopathy, lumbar region: Secondary | ICD-10-CM | POA: Insufficient documentation

## 2020-08-18 DIAGNOSIS — G894 Chronic pain syndrome: Secondary | ICD-10-CM | POA: Insufficient documentation

## 2020-08-18 DIAGNOSIS — E1129 Type 2 diabetes mellitus with other diabetic kidney complication: Secondary | ICD-10-CM | POA: Diagnosis not present

## 2020-08-18 NOTE — Progress Notes (Signed)
Acupuncture treatment #5 Indication right hand paresthesias and wrist pain Needles placed at Th4-Th5 T8 8 as well as MH 6 and MH 7 electrical stimulation at 100 Hz between TH 4 and MH6 as well as between Th5 and MH 7 Patient tolerated procedure well Post procedure instructions given Total treatment time 25 minutes  You will need up to 12 visits with this protocol.

## 2020-08-18 NOTE — Patient Instructions (Signed)
Acupuncture Acupuncture is a type of treatment that involves stimulating specific points on your body by inserting thin needles through your skin. Acupuncture is often used to treat pain, but it may also be used to help relieve other types of symptoms. Your health care provider may recommend acupuncture to help treat various conditions, such as:  Migraine headaches.  Tension headaches.  Arthritis pain.  Addiction.  Chronic pain.  Nausea and vomiting after a surgery.  High blood pressure (hypertension).  Chronic obstructive pulmonary disease (COPD).  Nausea caused by cancer treatment.  Sudden or severe (acute) pain. Acupuncture is based on traditional [place] medicine, which recognizes more than 2,000 points on the body that connect energy pathways (meridians) through the body. The goal in stimulating these points is to balance the physical, emotional, and mental energy in your body. Acupuncture is done by a health care provider who has specialized training (licensed acupuncture practitioner). Treatment often requires several acupuncture sessions. You may have acupuncture along with other medical treatments. Tell a health care provider about:  Any allergies you have.  All medicines you are taking, including vitamins, herbs, eye drops, creams, and over-the-counter medicines.  Any blood disorders you have.  Any surgeries you have had.  Any medical conditions you have.  Whether you are pregnant or may be pregnant. What are the risks? Generally, this is a safe treatment. However, problems may occur, including:  Skin infection.  Damage to organs or structures that are under the skin where a needle is placed. What happens before the treatment?  Your acupuncture practitioner will ask about your medical history and your symptoms.  You may have a physical exam. What happens during the treatment? The exact procedure will depend on your condition and how your acupuncture provider  treats it. In general:  Your skin will be cleaned with a germ-killing (antiseptic) solution.  Your acupuncture practitioner will open a new set of germ-free (sterile) needles.  The needles will be gently inserted into your skin. They will be left in place for a certain amount of time. You may feel slight pain or a tingling sensation.  Your acupuncture practitioner may: ? Apply electrical energy to the needles. ? Adjust the needles in certain ways.  After your procedure, the acupuncture practitioner will remove the needles, throw them away, and clean your skin. The procedure may vary among health care providers. What can I expect after the treatment? People react differently to acupuncture. Make sure you ask your acupuncture provider what to expect after your treatment. It is common to have:  Minor bruising.  Mild pain.  A small amount of bleeding. Follow these instructions at home:  Follow any instructions given by your provider after the treatment.  Keep all follow-up visits as told by your health care provider. This is important. Contact a health care provider if:  You have questions about your reaction to the treatment.  You have soreness.  You have skin irritation or redness.  You have a fever. Summary  Acupuncture is a type of treatment that involves stimulating specific points on your body by inserting thin needles through your skin.  This treatment is often used to treat pain, but it may also be used to help relieve other types of symptoms.  The exact procedure will depend on your condition and how your acupuncture provider treats it. This information is not intended to replace advice given to you by your health care provider. Make sure you discuss any questions you have with your health  care provider. Document Revised: 11/29/2017 Document Reviewed: 09/13/2017 Elsevier Patient Education  [place].

## 2020-09-07 ENCOUNTER — Other Ambulatory Visit: Payer: Self-pay

## 2020-09-07 NOTE — Patient Outreach (Signed)
[place] [facility]) Care Management  09/07/2020  Sara Tran [birth year] [id number]   Telephone Assessment Quarterly Call   Outreach attempt to patient. No answer after multiple rings and unable to leave message.      Plan: RN CM will make quarterly outreach attempt within the month of Dec.  Sara Tran Sara Tran [place] Management Telephonic Care Management Coordinator Direct Phone: [phone] Toll Free: [phone] Fax: [phone]

## 2020-09-08 ENCOUNTER — Other Ambulatory Visit: Payer: Self-pay

## 2020-09-08 NOTE — Patient Outreach (Signed)
[place] [facility]) Care Management  09/08/2020  Sara Tran April 13, 1956 [id number]   Telephone Assessment Annual Assessment  Voicemail message received from patient. Return call placed to patient. Spoke with patient who denies any acute issues or concerns at present. She reports that she is getting ready to go to dentist appt today. She states she has seen eye MD earlier this year and got new pair of glasses. She reports she goes back to see PCP around the end of the year. Patient voices that she has been staying with her father to help provide care for him. She voices some caregiver frustration/fatigue and support given. She states that she went for a colonoscopy last month but was not cleaned out good enough to have it done and will have to repeat test. Appetite remains good. Patient states she is trying to eat better in order to lose wgt and has lost about five pounds. She continues to monitor cbgs in the home. Last cbg reported as 166. Per EMR most recent A1C was 6.2(June 2021). She remains independent with ADLs/IADLs. She denies any recent falls. She denies any RN CM needs or concerns at this time.    Medications Reviewed Today    Reviewed by Hayden Pedro, RN (Registered Nurse) on 09/08/20 at 1051  Med List Status: <None>  Medication Order Taking? Sig Documenting Provider Last Dose Status Informant  albuterol (PROVENTIL HFA;VENTOLIN HFA) 108 (90 BASE) MCG/ACT inhaler [id number] No Inhale 2 puffs into the lungs every 6 (six) hours as needed for wheezing or shortness of breath.  [provider] Taking Active Self  albuterol (PROVENTIL) (2.5 MG/3ML) 0.083% nebulizer solution [id number] No Take 2.5 mg by nebulization every 4 (four) hours as needed for wheezing or shortness of breath.  [provider] Taking Active Self           Med Note [name], JESSICA R   Tue Jul 23, 2017  2:17 PM)    ALPRAZolam (XANAX) 0.5 MG tablet [id number] No Take 0.5 mg by mouth  daily as needed for anxiety.  [provider] Taking Active Self  amLODipine (NORVASC) 10 MG tablet [id number] No Take 10 mg by mouth every evening.  [provider] Taking Active Self  Ascorbic Acid (VITAMIN C) 500 MG CAPS [id number] No Take 500 mg by mouth daily.  [provider] Taking Active Self  baclofen (LIORESAL) 10 MG tablet [id number] No Take 1 tablet (10 mg total) by mouth 3 (three) times daily as needed for muscle spasms. Meredith Staggers, MD Taking Active Self  clobetasol (TEMOVATE) 0.05 % external solution [id number] No Apply 1 application topically daily as needed (scalp irritation).  [provider] Taking Active Self  DERMA-SMOOTHE/FS SCALP 0.01 % OIL [id number] No Apply 1 application topically at bedtime.  [provider] Taking Active Self  desonide (DESOWEN) 0.05 % cream [id number] No Apply 1 application topically 2 (two) times daily as needed (irritation).  [provider] Taking Active Self  EPINEPHrine 0.3 mg/0.3 mL IJ SOAJ injection [id number] No Inject 0.3 mg into the muscle as needed for anaphylaxis.  [provider] Taking Active Self           Med Note [name], [name]   Wed Jul 06, 2020  1:23 PM)    fluticasone (FLONASE) 50 MCG/ACT nasal spray [id number] No Place 1 spray into both nostrils daily.  [provider] Taking Active Self  furosemide (LASIX) 40 MG tablet [id number] No Take 40 mg by mouth daily.  [provider] Taking Active Self  gabapentin (NEURONTIN) 600 MG tablet [id number] No Take 600 mg by mouth 3 (three) times daily.  [provider] Taking Active Self  isosorbide mononitrate (IMDUR) 30 MG 24 hr tablet [id number] No Take 30 mg by mouth daily.  [provider] Taking Active Self  linaclotide Rolan Lipa) 72 MCG capsule [id number] No Take 72 mcg by mouth daily before breakfast. [provider] Taking Active Self  loratadine (CLARITIN) 10 MG tablet [id number] No Take  10 mg by mouth daily.  [provider] Taking Active Self  losartan-hydrochlorothiazide (HYZAAR) 100-25 MG tablet [id number] No Take 1 tablet by mouth daily. [provider] Taking Active Self  magnesium oxide (MAG-OX) 400 MG tablet [id number] No Take 400 mg by mouth daily.  [provider] Taking Active Self  Multiple Vitamin (MULTIVITAMIN WITH MINERALS) TABS tablet [id number] No Take 1 tablet by mouth daily. [provider] Taking Active Self  nitroGLYCERIN (NITROSTAT) 0.4 MG SL tablet [id number] No Place 0.4 mg under the tongue every 5 (five) minutes as needed for chest pain.  [provider] Taking Active Self  ondansetron (ZOFRAN) 4 MG tablet [id number] No Take 4 mg by mouth every 8 (eight) hours as needed for nausea or vomiting.  [provider] Taking Active Self  oxyCODONE-acetaminophen (PERCOCET) 10-325 MG tablet [id number] No Take 1 tablet by mouth 4 (four) times daily as needed for pain.  [provider] Taking Active Self  potassium chloride SA (K-DUR) 20 MEQ tablet [id number] No Take 20 mEq by mouth daily.  [provider] Taking Active Self  SYMBICORT 160-4.5 MCG/ACT inhaler [id number] No Take 2 puffs by mouth 2 (two) times daily. [provider] Taking Active Self  TRULICITY 1.5 WN/4.6EV SOPN [id number] No Inject 1.5 mg into the skin every 7 (seven) days.  [provider] Taking Active Self           Med Note [facility], [name]   Wed Mar 16, 2020  2:10 PM)    vitamin B-12 (CYANOCOBALAMIN) 1000 MCG tablet [id number] No Take 1,000 mcg by mouth daily. [provider] Taking Active Self  Vitamin D, Ergocalciferol, (DRISDOL) 1.25 MG (50000 UNIT) CAPS capsule [id number] No Take 50,000 Units by mouth every 7 (seven) days.  [provider] Taking Active Self           Fall Risk  09/08/2020 08/18/2020 07/26/2020 07/26/2020 07/19/2020  Falls in the past year? 0 0 1 0 0  Comment - - - - -  Number  falls in past yr: 0 0 0 0 -  Injury with Fall? 0 - 0 0 -  Risk for fall due to : Medication side effect;Impaired vision History of fall(s) - - -  Risk for fall due to: Comment - - - - -  Follow up Education provided;Falls evaluation completed - - - -    Depression screen [facility] 2/9 09/08/2020 08/18/2020 07/26/2020 06/14/2020 04/18/2020  Decreased Interest 0 0 0 0 0  Down, Depressed, Hopeless 0 0 0 0 0  PHQ - 2 Score 0 0 0 0 0  Altered sleeping - - - - 0  Tired, decreased energy - - - - 3  Change in appetite - - - - 3  Feeling bad or failure about yourself  - - - - 0  Trouble concentrating - - - - 0  Moving slowly or fidgety/restless - - - - 0  Suicidal thoughts - - - - 0  PHQ-9 Score - - - -  6  Difficult doing work/chores - - - - Not difficult at all  Some recent data might be hidden    SDOH Screenings   Alcohol Screen: Low Risk   . Last Alcohol Screening Score (AUDIT): 0  Depression (PHQ2-9): Low Risk   . PHQ-2 Score: 0  Financial Resource Strain: Unknown  . Difficulty of Paying Living Expenses: Patient refused  Food Insecurity: Food Insecurity Present  . Worried About Charity fundraiser in the Last Year: Sometimes true  . Ran Out of Food in the Last Year: Sometimes true  Housing: Low Risk   . Last Housing Risk Score: 0  Physical Activity: Insufficiently Active  . Days of Exercise per Week: 1 day  . Minutes of Exercise per Session: 10 min  Social Connections: Unknown  . Frequency of Communication with Friends and Family: More than three times a week  . Frequency of Social Gatherings with Friends and Family: Patient refused  . Attends Religious Services: Patient refused  . Active Member of Clubs or Organizations: Yes  . Attends Archivist Meetings: Patient refused  . Marital Status: Patient refused  Stress: Unknown  . Feeling of Stress : Patient refused  Tobacco Use: Medium Risk  . Smoking Tobacco Use: Former Smoker  . Smokeless Tobacco Use: Never Used   Transportation Needs: No Transportation Needs  . Lack of Transportation (Medical): No  . Lack of Transportation (Non-Medical): No    THN CM Care Plan Problem One     Most Recent Value  Care Plan Problem One Knowledge deficit related to disease process and mgmt of Diabetes.  Role Documenting the Problem One Care Management Telephonic Coordinator  Care Plan for Problem One Active  THN CM Short Term Goal #1  Patient will report maintianing A1C level of less than 7 over the next 90 days.  THN CM Short Term Goal #1 Start Date 09/08/20  Interventions for Short Term Goal #1 RNCM assessed and reviewd cbg monitoring and values, discussed most recent A1c level and need for repeat testing per MD order, assessed for diet & med adherence    [facility] CM Care Plan Problem Two     Most Recent Value  Care Plan Problem Two Patient due for health maintenance  Role Documenting the Problem Two Care Management Telephonic Coordinator  Care Plan for Problem Two Active  Interventions for Problem Two Long Term Goal  RNCM discussed importance of testing and proper prep  THN Long Term Goal Patient will report successful completion of colonscopy over the next 90 days  THN Long Term Goal Start Date 09/08/20      Plan: RN CM discussed with patient next outreach within the month of Dec. Patient gave verbal consent and in agreement with RN CM follow up and timeframe. Patient aware that they may contact RN CM sooner for any issues or concerns. RN CM will send quarterly update to PCP.  Enzo Montgomery, RN,BSN,CCM [place] Management Telephonic Care Management Coordinator Direct Phone: [phone] Toll Free: [phone] Fax: [phone]

## 2020-09-09 ENCOUNTER — Encounter: Payer: Self-pay | Admitting: Physical Medicine & Rehabilitation

## 2020-09-09 ENCOUNTER — Other Ambulatory Visit: Payer: Self-pay

## 2020-09-09 ENCOUNTER — Encounter: Payer: Medicare HMO | Attending: Physical Medicine & Rehabilitation | Admitting: Physical Medicine & Rehabilitation

## 2020-09-09 VITALS — BP 169/75 | HR 86 | Temp 98.2°F | Ht 60.0 in | Wt 273.6 lb

## 2020-09-09 DIAGNOSIS — M5416 Radiculopathy, lumbar region: Secondary | ICD-10-CM | POA: Insufficient documentation

## 2020-09-09 DIAGNOSIS — K581 Irritable bowel syndrome with constipation: Secondary | ICD-10-CM | POA: Diagnosis not present

## 2020-09-09 DIAGNOSIS — M797 Fibromyalgia: Secondary | ICD-10-CM | POA: Diagnosis not present

## 2020-09-09 DIAGNOSIS — G894 Chronic pain syndrome: Secondary | ICD-10-CM | POA: Diagnosis not present

## 2020-09-09 DIAGNOSIS — G5601 Carpal tunnel syndrome, right upper limb: Secondary | ICD-10-CM | POA: Diagnosis not present

## 2020-09-09 DIAGNOSIS — M47816 Spondylosis without myelopathy or radiculopathy, lumbar region: Secondary | ICD-10-CM | POA: Diagnosis not present

## 2020-09-09 NOTE — Patient Instructions (Signed)
Acupuncture Acupuncture is a type of treatment that involves stimulating specific points on your body by inserting thin needles through your skin. Acupuncture is often used to treat pain, but it may also be used to help relieve other types of symptoms. Your health care provider may recommend acupuncture to help treat various conditions, such as:  Migraine headaches.  Tension headaches.  Arthritis pain.  Addiction.  Chronic pain.  Nausea and vomiting after a surgery.  High blood pressure (hypertension).  Chronic obstructive pulmonary disease (COPD).  Nausea caused by cancer treatment.  Sudden or severe (acute) pain. Acupuncture is based on traditional [place] medicine, which recognizes more than 2,000 points on the body that connect energy pathways (meridians) through the body. The goal in stimulating these points is to balance the physical, emotional, and mental energy in your body. Acupuncture is done by a health care provider who has specialized training (licensed acupuncture practitioner). Treatment often requires several acupuncture sessions. You may have acupuncture along with other medical treatments. Tell a health care provider about:  Any allergies you have.  All medicines you are taking, including vitamins, herbs, eye drops, creams, and over-the-counter medicines.  Any blood disorders you have.  Any surgeries you have had.  Any medical conditions you have.  Whether you are pregnant or may be pregnant. What are the risks? Generally, this is a safe treatment. However, problems may occur, including:  Skin infection.  Damage to organs or structures that are under the skin where a needle is placed. What happens before the treatment?  Your acupuncture practitioner will ask about your medical history and your symptoms.  You may have a physical exam. What happens during the treatment? The exact procedure will depend on your condition and how your acupuncture provider  treats it. In general:  Your skin will be cleaned with a germ-killing (antiseptic) solution.  Your acupuncture practitioner will open a new set of germ-free (sterile) needles.  The needles will be gently inserted into your skin. They will be left in place for a certain amount of time. You may feel slight pain or a tingling sensation.  Your acupuncture practitioner may: ? Apply electrical energy to the needles. ? Adjust the needles in certain ways.  After your procedure, the acupuncture practitioner will remove the needles, throw them away, and clean your skin. The procedure may vary among health care providers. What can I expect after the treatment? People react differently to acupuncture. Make sure you ask your acupuncture provider what to expect after your treatment. It is common to have:  Minor bruising.  Mild pain.  A small amount of bleeding. Follow these instructions at home:  Follow any instructions given by your provider after the treatment.  Keep all follow-up visits as told by your health care provider. This is important. Contact a health care provider if:  You have questions about your reaction to the treatment.  You have soreness.  You have skin irritation or redness.  You have a fever. Summary  Acupuncture is a type of treatment that involves stimulating specific points on your body by inserting thin needles through your skin.  This treatment is often used to treat pain, but it may also be used to help relieve other types of symptoms.  The exact procedure will depend on your condition and how your acupuncture provider treats it. This information is not intended to replace advice given to you by your health care provider. Make sure you discuss any questions you have with your health  care provider. Document Revised: 11/29/2017 Document Reviewed: 09/13/2017 Elsevier Patient Education  [place].

## 2020-09-09 NOTE — Progress Notes (Signed)
Acupuncture treatment #6 Indication right hand paresthesias and wrist pain Needles placed at Th4-Th5 T8 8 as well as MH 6 and MH 7 electrical stimulation at 100 Hz between TH 4 and MH6 as well as between Th5 and MH 7 Patient tolerated procedure well Post procedure instructions given Total treatment time 25 minutes  You will need up to 12 visits with this protocol.

## 2020-09-15 DIAGNOSIS — G4733 Obstructive sleep apnea (adult) (pediatric): Secondary | ICD-10-CM | POA: Diagnosis not present

## 2020-09-16 ENCOUNTER — Other Ambulatory Visit: Payer: Self-pay

## 2020-09-16 ENCOUNTER — Encounter: Payer: Medicare HMO | Admitting: Physical Medicine & Rehabilitation

## 2020-09-16 ENCOUNTER — Ambulatory Visit: Payer: Self-pay

## 2020-09-16 ENCOUNTER — Encounter: Payer: Self-pay | Admitting: Physical Medicine & Rehabilitation

## 2020-09-16 VITALS — BP 123/75 | HR 92 | Temp 97.8°F | Ht 60.0 in | Wt 272.0 lb

## 2020-09-16 DIAGNOSIS — M5416 Radiculopathy, lumbar region: Secondary | ICD-10-CM | POA: Diagnosis not present

## 2020-09-16 DIAGNOSIS — K581 Irritable bowel syndrome with constipation: Secondary | ICD-10-CM | POA: Diagnosis not present

## 2020-09-16 DIAGNOSIS — M797 Fibromyalgia: Secondary | ICD-10-CM | POA: Diagnosis not present

## 2020-09-16 DIAGNOSIS — G894 Chronic pain syndrome: Secondary | ICD-10-CM | POA: Diagnosis not present

## 2020-09-16 DIAGNOSIS — G5601 Carpal tunnel syndrome, right upper limb: Secondary | ICD-10-CM | POA: Diagnosis not present

## 2020-09-16 DIAGNOSIS — M47816 Spondylosis without myelopathy or radiculopathy, lumbar region: Secondary | ICD-10-CM | POA: Diagnosis not present

## 2020-09-16 NOTE — Progress Notes (Signed)
Acupuncture treatment #7 Indication right hand paresthesias and wrist pain Needles placed at Th4-Th5 T8 8 as well as MH 6 and MH 7 electrical stimulation at 100 Hz between TH 4 and MH6 as well as between Th5 and MH 7 Patient tolerated procedure well Post procedure instructions given Total treatment time 25 minutes  You will need up to 12 visits with this protocol.

## 2020-09-16 NOTE — Patient Instructions (Signed)
Acupuncture Acupuncture is a type of treatment that involves stimulating specific points on your body by inserting thin needles through your skin. Acupuncture is often used to treat pain, but it may also be used to help relieve other types of symptoms. Your health care provider may recommend acupuncture to help treat various conditions, such as:  Migraine headaches.  Tension headaches.  Arthritis pain.  Addiction.  Chronic pain.  Nausea and vomiting after a surgery.  High blood pressure (hypertension).  Chronic obstructive pulmonary disease (COPD).  Nausea caused by cancer treatment.  Sudden or severe (acute) pain. Acupuncture is based on traditional [place] medicine, which recognizes more than 2,000 points on the body that connect energy pathways (meridians) through the body. The goal in stimulating these points is to balance the physical, emotional, and mental energy in your body. Acupuncture is done by a health care provider who has specialized training (licensed acupuncture practitioner). Treatment often requires several acupuncture sessions. You may have acupuncture along with other medical treatments. Tell a health care provider about:  Any allergies you have.  All medicines you are taking, including vitamins, herbs, eye drops, creams, and over-the-counter medicines.  Any blood disorders you have.  Any surgeries you have had.  Any medical conditions you have.  Whether you are pregnant or may be pregnant. What are the risks? Generally, this is a safe treatment. However, problems may occur, including:  Skin infection.  Damage to organs or structures that are under the skin where a needle is placed. What happens before the treatment?  Your acupuncture practitioner will ask about your medical history and your symptoms.  You may have a physical exam. What happens during the treatment? The exact procedure will depend on your condition and how your acupuncture provider  treats it. In general:  Your skin will be cleaned with a germ-killing (antiseptic) solution.  Your acupuncture practitioner will open a new set of germ-free (sterile) needles.  The needles will be gently inserted into your skin. They will be left in place for a certain amount of time. You may feel slight pain or a tingling sensation.  Your acupuncture practitioner may: ? Apply electrical energy to the needles. ? Adjust the needles in certain ways.  After your procedure, the acupuncture practitioner will remove the needles, throw them away, and clean your skin. The procedure may vary among health care providers. What can I expect after the treatment? People react differently to acupuncture. Make sure you ask your acupuncture provider what to expect after your treatment. It is common to have:  Minor bruising.  Mild pain.  A small amount of bleeding. Follow these instructions at home:  Follow any instructions given by your provider after the treatment.  Keep all follow-up visits as told by your health care provider. This is important. Contact a health care provider if:  You have questions about your reaction to the treatment.  You have soreness.  You have skin irritation or redness.  You have a fever. Summary  Acupuncture is a type of treatment that involves stimulating specific points on your body by inserting thin needles through your skin.  This treatment is often used to treat pain, but it may also be used to help relieve other types of symptoms.  The exact procedure will depend on your condition and how your acupuncture provider treats it. This information is not intended to replace advice given to you by your health care provider. Make sure you discuss any questions you have with your health  care provider. Document Revised: 11/29/2017 Document Reviewed: 09/13/2017 Elsevier Patient Education  [place].

## 2020-09-22 ENCOUNTER — Other Ambulatory Visit: Payer: Self-pay

## 2020-09-22 ENCOUNTER — Encounter: Payer: Self-pay | Admitting: Internal Medicine

## 2020-09-22 ENCOUNTER — Ambulatory Visit: Payer: Medicare HMO | Admitting: Internal Medicine

## 2020-09-22 DIAGNOSIS — R140 Abdominal distension (gaseous): Secondary | ICD-10-CM

## 2020-09-22 DIAGNOSIS — K5901 Slow transit constipation: Secondary | ICD-10-CM | POA: Diagnosis not present

## 2020-09-22 NOTE — Progress Notes (Signed)
Referring Provider: Sharilyn Sites, MD Primary Care Physician:  Sharilyn Sites, MD Primary GI:  Dr. Abbey Chatters  Chief Complaint  Patient presents with  . Colonoscopy    poor prep with last tcs in August   . Constipation    HPI:   Sara Tran is a 64 y.o. female who presents to the clinic today for follow-up visit.  She has longstanding history of constipation.  Patient is chronically on opioids.  She was scheduled to undergo colonoscopy last month but unfortunately her colon was not prepped properly so procedure was aborted.  Last colonoscopy in 2011 showed tortuous colon, diverticulosis, one sigmoid polyp which was benign.  Random colon biopsies were negative for microscopic colitis at that time.  Patient continues to have issues with constipation.  She states she is on Linzess 72 mcg daily.  She states sometimes she wishes she had an extra dose on days she feels backed up.  She is previously on Linzess 145 mcg daily which she states was too much.  Notes trying to eat healthier lately by including more fruits and vegetables in her diet.  Patient notes many psychosocial issues at home try to take care of her father.  She believes the roof is about to cave in for what sounds like water damage.  Past Medical History:  Diagnosis Date  . Adnexal cyst    Right simple cyst seen on [place]  . Anginal pain ([place])   . Anxiety   . Arthritis   . Asthma   . Atrial fibrillation (HCC)    hx of   . CHF (congestive heart failure) ([place])   . Chronic back pain   . Chronic hip pain   . COPD (chronic obstructive pulmonary disease) ([place])   . Coronary artery disease    stents placed   . Dyspnea   . GERD (gastroesophageal reflux disease)   . Headache    due to pinched nerve damaage   . Heart murmur    hx of slight murmur   . History of bronchitis   . History of cardiac catheterization    Normal coronaries 2013  . History of kidney stones   . Hot flashes   . Hypertension   . Hypertriglyceridemia   .  IBS (irritable bowel syndrome)   . Internal hemorrhoids   . Morbid obesity ([place])    s/p lap band surgery  . Myocardial infarction ([place]) 12/2018   times 3  . Obesity   . Pulmonary HTN ([place])    [place] 2008:  RA pressures 13/10 with a mean of 7 mmHg.  RV pressure 62/26 with end diastolic pressure of 15 mmHg.  PA pressure 49/23 with a mean of 35 mmHg.  Pulmonary capillary wedge 17/50 with a mean of 14 mmHg. The PA saturation was 68%.  RA saturation was 71% and aortic saturation was 90%.  Cardiac output was 6.0 with a cardiac index of 2.80 by Fick.  Normal coronaries by cath 2008.  Marland Kitchen Renal insufficiency   . Sciatica of right side   . Sleep apnea    CPAP use does not know settings   . Trauma   . Type 2 diabetes mellitus ([place])    type II   . Uterine fibroid     Past Surgical History:  Procedure Laterality Date  . accupuncture    . APPENDECTOMY    . CARDIAC CATHETERIZATION  07/2007, 05/2012   Normal coronary arteries  . COLONOSCOPY  10/18/2010   SLF:2-mm sessile  sigmoid polyp/diverticula, tortuous colon.  Biopsies of sigmoid colon polyp and random colon biopsies benign  . COLONOSCOPY WITH PROPOFOL N/A 08/09/2020   Procedure: COLONOSCOPY WITH PROPOFOL;  Surgeon: Eloise Harman, DO;  [facility];  Service: Endoscopy;  Laterality: N/A;  11:00am  . CYSTOSCOPY/URETEROSCOPY/HOLMIUM LASER/STENT PLACEMENT Right 08/01/2017   Procedure: CYSTOSCOPY/ RIGHT URETEROSCOPY/ RIGHT RETROGRADE/ HOLMIUM LASER  AND RIGHT STENT PLACEMENT FIRST STAGE;  Surgeon: Irine Seal, MD;  Location: [facility];  Service: Urology;  Laterality: Right;  . CYSTOSCOPY/URETEROSCOPY/HOLMIUM LASER/STENT PLACEMENT Right 08/08/2017   Procedure: RIGHT URETEROSCOPY WITH HOLMIUM LASER RIGHT STENT PLACEMENT;  Surgeon: Irine Seal, MD;  Location: [facility];  Service: Urology;  Laterality: Right;  . EXTRACORPOREAL SHOCK WAVE LITHOTRIPSY Right 08/22/2017   Procedure: RIGHT EXTRACORPOREAL SHOCK WAVE LITHOTRIPSY (ESWL);  Surgeon: Alexis Frock, MD;  Location: [facility];  Service: Urology;  Laterality: Right;  . HEMORRHOID BANDING  07/2012   Dr. Oneida Alar  . LAPAROSCOPIC CHOLECYSTECTOMY  1985  . LAPAROSCOPIC GASTRIC BANDING  12/12/2010  . LEFT AND RIGHT HEART CATHETERIZATION WITH CORONARY ANGIOGRAM N/A 06/03/2012   Procedure: LEFT AND RIGHT HEART CATHETERIZATION WITH CORONARY ANGIOGRAM;  Surgeon: Jolaine Artist, MD;  Location: [facility] CATH LAB;  Service: Cardiovascular;  Laterality: N/A;  . PILONIDAL CYST EXCISION  1974  . RIGHT/LEFT HEART CATH AND CORONARY ANGIOGRAPHY N/A 03/28/2020   Procedure: RIGHT/LEFT HEART CATH AND CORONARY ANGIOGRAPHY;  Surgeon: Jolaine Artist, MD;  Location: [facility] CV LAB;  Service: Cardiovascular;  Laterality: N/A;  . 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 TUMOR EXCISION  10/2011   Face    Current Outpatient Medications  Medication Sig Dispense Refill  . albuterol (PROVENTIL HFA;VENTOLIN HFA) 108 (90 BASE) MCG/ACT inhaler Inhale 2 puffs into the lungs every 6 (six) hours as needed for wheezing or shortness of breath.     Marland Kitchen albuterol (PROVENTIL) (2.5 MG/3ML) 0.083% nebulizer solution Take 2.5 mg by nebulization every 4 (four) hours as needed for wheezing or shortness of breath.     . ALPRAZolam (XANAX) 0.5 MG tablet Take 0.5 mg by mouth daily as needed for anxiety.     Marland Kitchen amLODipine (NORVASC) 10 MG tablet Take 10 mg by mouth every evening.     . baclofen (LIORESAL) 10 MG tablet Take 1 tablet (10 mg total) by mouth 3 (three) times daily as needed for muscle spasms. 45 tablet 3  . clobetasol (TEMOVATE) 0.05 % external solution Apply 1 application topically daily as needed (scalp irritation).     . DERMA-SMOOTHE/FS SCALP 0.01 % OIL Apply 1 application topically at bedtime.     Marland Kitchen desonide (DESOWEN) 0.05 % cream Apply 1 application topically 2 (two) times daily as needed (irritation).     Marland Kitchen EPINEPHrine 0.3 mg/0.3 mL IJ SOAJ injection Inject 0.3 mg into the muscle as needed for anaphylaxis.     .  fluticasone (FLONASE) 50 MCG/ACT nasal spray Place 1 spray into both nostrils daily.     . furosemide (LASIX) 40 MG tablet Take 40 mg by mouth daily.     Marland Kitchen gabapentin (NEURONTIN) 600 MG tablet Take 600 mg by mouth 3 (three) times daily.     . isosorbide mononitrate (IMDUR) 30 MG 24 hr tablet Take 30 mg by mouth daily.     Marland Kitchen linaclotide (LINZESS) 72 MCG capsule Take 72 mcg by mouth daily before breakfast.    . loratadine (CLARITIN) 10 MG tablet Take 10 mg by mouth daily.     Marland Kitchen  losartan-hydrochlorothiazide (HYZAAR) 100-25 MG tablet Take 1 tablet by mouth daily.    . magnesium oxide (MAG-OX) 400 MG tablet Take 400 mg by mouth daily.     . Multiple Vitamin (MULTIVITAMIN WITH MINERALS) TABS tablet Take 1 tablet by mouth daily.    . nitroGLYCERIN (NITROSTAT) 0.4 MG SL tablet Place 0.4 mg under the tongue every 5 (five) minutes as needed for chest pain.     Marland Kitchen ondansetron (ZOFRAN) 4 MG tablet Take 4 mg by mouth every 8 (eight) hours as needed for nausea or vomiting.     Marland Kitchen oxyCODONE-acetaminophen (PERCOCET) 10-325 MG tablet Take 1 tablet by mouth 4 (four) times daily as needed for pain.     . potassium chloride SA (K-DUR) 20 MEQ tablet Take 20 mEq by mouth daily.     . SYMBICORT 160-4.5 MCG/ACT inhaler Take 2 puffs by mouth 2 (two) times daily.    . TRULICITY 1.5 PO/2.4MP SOPN Inject 1.5 mg into the skin every 7 (seven) days.     . vitamin B-12 (CYANOCOBALAMIN) 1000 MCG tablet Take 1,000 mcg by mouth daily.    . Vitamin D, Ergocalciferol, (DRISDOL) 1.25 MG (50000 UNIT) CAPS capsule Take 50,000 Units by mouth every 7 (seven) days.     . Ascorbic Acid (VITAMIN C) 500 MG CAPS Take 500 mg by mouth daily.      No current facility-administered medications for this visit.    Allergies as of 09/22/2020 - Review Complete 09/22/2020  Allergen Reaction Noted  . Ancef [cefazolin] Anaphylaxis and Shortness Of Breath 07/23/2017  . Bee venom Anaphylaxis 05/04/2017  . Meloxicam  03/16/2020  . Ceftin [cefuroxime  axetil] Swelling 10/06/2013  . Chicken meat (diagnostic) Nausea And Vomiting 03/16/2020  . Ciprofloxacin Cough 09/27/2011  . Macrodantin [nitrofurantoin] Other (See Comments) 07/23/2017  . Meat [alpha-gal] Nausea And Vomiting 03/16/2020  . Milk-related compounds Other (See Comments) 11/21/2016  . Nsaids Other (See Comments) 03/16/2020  . Other  07/23/2017  . Latex Itching and Rash 04/22/2012    Family History  Problem Relation Age of Onset  . Diabetes Mother   . Heart failure Mother   . Breast cancer Sister   . CAD Father   . Hypertension Father   . Heart failure Father   . Cancer Paternal Grandfather   . Hypertension Paternal Grandmother   . Stroke Paternal Grandmother   . Other Maternal Grandmother        tonsilitis  . Diabetes Maternal Grandfather   . Hypertension Maternal Grandfather   . Breast cancer Paternal Aunt     Social History   Socioeconomic History  . Marital status: Single    Spouse name: Not on file  . Number of children: 0  . Years of education: Not on file  . Highest education level: Not on file  Occupational History  . Occupation: Volunteers  . 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 Smoking status: Former Smoker    Types: Cigarettes    Quit date: 08/08/1996    Years since quitting: 24.1  . Smokeless tobacco: Never Used  Vaping Use  . Vaping Use: Never used  Substance and Sexual Activity  . Alcohol use: No  . Drug use: No  . Sexual activity: Yes    Birth control/protection: Post-menopausal  Other Topics Concern  . Not on file  Social History Narrative  . Not on file   Social Determinants of Health   Financial Resource Strain:  Unknown  . Difficulty of Paying Living Expenses: Patient refused  Food Insecurity: Food Insecurity Present  . Worried About Charity fundraiser in the Last Year: Sometimes true  . Ran Out of Food in the Last Year: Sometimes true  Transportation Needs: No  Transportation Needs  . Lack of Transportation (Medical): No  . Lack of Transportation (Non-Medical): No  Physical Activity: Insufficiently Active  . Days of Exercise per Week: 1 day  . Minutes of Exercise per Session: 10 min  Stress: Unknown  . Feeling of Stress : Patient refused  Social Connections: Unknown  . Frequency of Communication with Friends and Family: More than three times a week  . Frequency of Social Gatherings with Friends and Family: Patient refused  . Attends Religious Services: Patient refused  . Active Member of Clubs or Organizations: Yes  . Attends Archivist Meetings: Patient refused  . Marital Status: Patient refused    Subjective: Review of Systems  Constitutional: Negative for chills and fever.  HENT: Negative for congestion and hearing loss.   Eyes: Negative for blurred vision and double vision.  Respiratory: Negative for cough and shortness of breath.   Cardiovascular: Negative for chest pain and palpitations.  Gastrointestinal: Positive for constipation. Negative for abdominal pain, blood in stool, diarrhea, heartburn, melena and vomiting.  Genitourinary: Negative for dysuria and urgency.  Musculoskeletal: Negative for joint pain and myalgias.  Skin: Negative for itching and rash.  Neurological: Negative for dizziness and headaches.  Psychiatric/Behavioral: Negative for depression. The patient is not nervous/anxious.      Objective: BP 133/61   Pulse 78   Temp 97.7 F (36.5 C) (Temporal)   Ht 5' (1.524 m)   Wt 273 lb (123.8 kg)   LMP 06/06/2013   BMI 53.32 kg/m  Physical Exam Constitutional:      Appearance: Normal appearance. She is obese.  HENT:     Head: Normocephalic and atraumatic.  Eyes:     Extraocular Movements: Extraocular movements intact.     Conjunctiva/sclera: Conjunctivae normal.  Cardiovascular:     Rate and Rhythm: Normal rate and regular rhythm.  Pulmonary:     Effort: Pulmonary effort is normal.     Breath  sounds: Normal breath sounds.  Abdominal:     General: Bowel sounds are normal.     Palpations: Abdomen is soft.  Musculoskeletal:        General: No swelling. Normal range of motion.     Cervical back: Normal range of motion and neck supple.  Skin:    General: Skin is warm and dry.     Coloration: Skin is not jaundiced.  Neurological:     General: No focal deficit present.     Mental Status: She is alert and oriented to person, place, and time.  Psychiatric:        Mood and Affect: Mood normal.        Behavior: Behavior normal.      Assessment: *Constipation *Morbid obesity *Abdominal bloating *Chronic opioid use  Plan: Patient certainly warrants repeat colonoscopy though she would like to work on getting her constipation under better control at this time.  I recommended that she take 2 of her Linzess tablets on days she feels backed up.  She is against the idea of taking 145 mg daily as she states this was too much.  Also counseled she could add MiraLAX to her regimen for breakthrough symptoms as well.  Counseled on adequate fiber in her diet.  She may add Benefiber or Metamucil over-the-counter as well.  Also counseled on importance of drinking at least 4 to 6 glasses of water daily.  She states she will work on this.  Patient to follow-up in 3 months or sooner if needed  09/22/2020 11:45 AM   Disclaimer: This note was dictated with voice recognition software. Similar sounding words can inadvertently be transcribed and may not be corrected upon review.

## 2020-09-22 NOTE — Patient Instructions (Addendum)
We will provide you with samples of Linzess 145 mcg today.  You may add MiraLAX over-the-counter as needed for breakthrough symptoms as well.    Be sure to drink at least 4 to 6 glasses of water daily.  I would recommend increasing fiber in your diet or adding over-the-counter Benefiber or Metamucil.  We will need to reschedule your colonoscopy at some point in the near future.  Follow-up with GI in 6 to 8 weeks or sooner if needed  At [facility] we value your feedback. You may receive a survey about your visit today. Please share your experience as we strive to create trusting relationships with our patients to provide genuine, compassionate, quality care.  We appreciate your understanding and patience as we review any laboratory studies, imaging, and other diagnostic tests that are ordered as we care for you. Our office policy is 5 business days for review of these results, and any emergent or urgent results are addressed in a timely manner for your best interest. If you do not hear from our office in 1 week, please contact us.   We also encourage the use of MyChart, which contains your medical information for your review as well. If you are not enrolled in this feature, an access code is on this after visit summary for your convenience. Thank you for allowing [place] to be involved in your care.  It was great to see you today!  I hope you have a great rest of your summer!!    Elon Alas. Abbey Chatters, D.O. Gastroenterology and Hepatology [facility]

## 2020-09-23 ENCOUNTER — Encounter: Payer: Self-pay | Admitting: Physical Medicine & Rehabilitation

## 2020-09-23 ENCOUNTER — Encounter: Payer: Medicare HMO | Admitting: Physical Medicine & Rehabilitation

## 2020-09-23 VITALS — BP 131/72 | HR 81 | Temp 97.9°F | Ht 60.0 in | Wt 272.6 lb

## 2020-09-23 DIAGNOSIS — M797 Fibromyalgia: Secondary | ICD-10-CM | POA: Diagnosis not present

## 2020-09-23 DIAGNOSIS — G894 Chronic pain syndrome: Secondary | ICD-10-CM | POA: Diagnosis not present

## 2020-09-23 DIAGNOSIS — M25531 Pain in right wrist: Secondary | ICD-10-CM | POA: Diagnosis not present

## 2020-09-23 DIAGNOSIS — G5601 Carpal tunnel syndrome, right upper limb: Secondary | ICD-10-CM

## 2020-09-23 DIAGNOSIS — K581 Irritable bowel syndrome with constipation: Secondary | ICD-10-CM | POA: Diagnosis not present

## 2020-09-23 DIAGNOSIS — R202 Paresthesia of skin: Secondary | ICD-10-CM | POA: Diagnosis not present

## 2020-09-23 DIAGNOSIS — M47816 Spondylosis without myelopathy or radiculopathy, lumbar region: Secondary | ICD-10-CM | POA: Diagnosis not present

## 2020-09-23 DIAGNOSIS — M5416 Radiculopathy, lumbar region: Secondary | ICD-10-CM | POA: Diagnosis not present

## 2020-09-23 NOTE — Patient Instructions (Signed)
Acupuncture Acupuncture is a type of treatment that involves stimulating specific points on your body by inserting thin needles through your skin. Acupuncture is often used to treat pain, but it may also be used to help relieve other types of symptoms. Your health care provider may recommend acupuncture to help treat various conditions, such as:  Migraine headaches.  Tension headaches.  Arthritis pain.  Addiction.  Chronic pain.  Nausea and vomiting after a surgery.  High blood pressure (hypertension).  Chronic obstructive pulmonary disease (COPD).  Nausea caused by cancer treatment.  Sudden or severe (acute) pain. Acupuncture is based on traditional [place] medicine, which recognizes more than 2,000 points on the body that connect energy pathways (meridians) through the body. The goal in stimulating these points is to balance the physical, emotional, and mental energy in your body. Acupuncture is done by a health care provider who has specialized training (licensed acupuncture practitioner). Treatment often requires several acupuncture sessions. You may have acupuncture along with other medical treatments. Tell a health care provider about:  Any allergies you have.  All medicines you are taking, including vitamins, herbs, eye drops, creams, and over-the-counter medicines.  Any blood disorders you have.  Any surgeries you have had.  Any medical conditions you have.  Whether you are pregnant or may be pregnant. What are the risks? Generally, this is a safe treatment. However, problems may occur, including:  Skin infection.  Damage to organs or structures that are under the skin where a needle is placed. What happens before the treatment?  Your acupuncture practitioner will ask about your medical history and your symptoms.  You may have a physical exam. What happens during the treatment? The exact procedure will depend on your condition and how your acupuncture provider  treats it. In general:  Your skin will be cleaned with a germ-killing (antiseptic) solution.  Your acupuncture practitioner will open a new set of germ-free (sterile) needles.  The needles will be gently inserted into your skin. They will be left in place for a certain amount of time. You may feel slight pain or a tingling sensation.  Your acupuncture practitioner may: ? Apply electrical energy to the needles. ? Adjust the needles in certain ways.  After your procedure, the acupuncture practitioner will remove the needles, throw them away, and clean your skin. The procedure may vary among health care providers. What can I expect after the treatment? People react differently to acupuncture. Make sure you ask your acupuncture provider what to expect after your treatment. It is common to have:  Minor bruising.  Mild pain.  A small amount of bleeding. Follow these instructions at home:  Follow any instructions given by your provider after the treatment.  Keep all follow-up visits as told by your health care provider. This is important. Contact a health care provider if:  You have questions about your reaction to the treatment.  You have soreness.  You have skin irritation or redness.  You have a fever. Summary  Acupuncture is a type of treatment that involves stimulating specific points on your body by inserting thin needles through your skin.  This treatment is often used to treat pain, but it may also be used to help relieve other types of symptoms.  The exact procedure will depend on your condition and how your acupuncture provider treats it. This information is not intended to replace advice given to you by your health care provider. Make sure you discuss any questions you have with your health  care provider. Document Revised: 11/29/2017 Document Reviewed: 09/13/2017 Elsevier Patient Education  [place].

## 2020-09-23 NOTE — Progress Notes (Signed)
Acupuncture treatment #8 Indication right hand paresthesias and wrist pain Needles placed at Th4-Th5 T8 8 as well as MH 6 and MH 7 electrical stimulation at 100 Hz between TH 4 and MH6 as well as between Th5 and MH 7 Patient tolerated procedure well Post procedure instructions given Total treatment time 25 minutes  You will need up to 12 visits with this protocol.

## 2020-09-29 DIAGNOSIS — G894 Chronic pain syndrome: Secondary | ICD-10-CM | POA: Diagnosis not present

## 2020-09-29 DIAGNOSIS — J449 Chronic obstructive pulmonary disease, unspecified: Secondary | ICD-10-CM | POA: Diagnosis not present

## 2020-09-29 DIAGNOSIS — Z6841 Body Mass Index (BMI) 40.0 and over, adult: Secondary | ICD-10-CM | POA: Diagnosis not present

## 2020-09-29 DIAGNOSIS — E1129 Type 2 diabetes mellitus with other diabetic kidney complication: Secondary | ICD-10-CM | POA: Diagnosis not present

## 2020-09-30 ENCOUNTER — Ambulatory Visit: Payer: Medicare HMO | Admitting: Physical Medicine & Rehabilitation

## 2020-10-03 DIAGNOSIS — E1129 Type 2 diabetes mellitus with other diabetic kidney complication: Secondary | ICD-10-CM | POA: Diagnosis not present

## 2020-10-03 DIAGNOSIS — Z6841 Body Mass Index (BMI) 40.0 and over, adult: Secondary | ICD-10-CM | POA: Diagnosis not present

## 2020-10-03 DIAGNOSIS — R0789 Other chest pain: Secondary | ICD-10-CM | POA: Diagnosis not present

## 2020-10-03 DIAGNOSIS — M1991 Primary osteoarthritis, unspecified site: Secondary | ICD-10-CM | POA: Diagnosis not present

## 2020-10-03 DIAGNOSIS — I272 Pulmonary hypertension, unspecified: Secondary | ICD-10-CM | POA: Diagnosis not present

## 2020-10-03 DIAGNOSIS — R079 Chest pain, unspecified: Secondary | ICD-10-CM | POA: Diagnosis not present

## 2020-10-03 DIAGNOSIS — I209 Angina pectoris, unspecified: Secondary | ICD-10-CM | POA: Diagnosis not present

## 2020-10-05 ENCOUNTER — Emergency Department (HOSPITAL_COMMUNITY): Payer: Medicare HMO

## 2020-10-05 ENCOUNTER — Other Ambulatory Visit: Payer: Self-pay

## 2020-10-05 ENCOUNTER — Encounter (HOSPITAL_COMMUNITY): Payer: Self-pay

## 2020-10-05 ENCOUNTER — Emergency Department (HOSPITAL_COMMUNITY)
Admission: EM | Admit: 2020-10-05 | Discharge: 2020-10-05 | Disposition: A | Discharge status: Home / Self Care | Payer: Medicare HMO | Attending: Emergency Medicine | Admitting: Emergency Medicine

## 2020-10-05 DIAGNOSIS — Z951 Presence of aortocoronary bypass graft: Secondary | ICD-10-CM | POA: Insufficient documentation

## 2020-10-05 DIAGNOSIS — I5032 Chronic diastolic (congestive) heart failure: Secondary | ICD-10-CM | POA: Insufficient documentation

## 2020-10-05 DIAGNOSIS — Z7951 Long term (current) use of inhaled steroids: Secondary | ICD-10-CM | POA: Diagnosis not present

## 2020-10-05 DIAGNOSIS — R072 Precordial pain: Secondary | ICD-10-CM | POA: Diagnosis not present

## 2020-10-05 DIAGNOSIS — J449 Chronic obstructive pulmonary disease, unspecified: Secondary | ICD-10-CM | POA: Diagnosis not present

## 2020-10-05 DIAGNOSIS — E119 Type 2 diabetes mellitus without complications: Secondary | ICD-10-CM | POA: Insufficient documentation

## 2020-10-05 DIAGNOSIS — Z91040 Latex allergy status: Secondary | ICD-10-CM | POA: Diagnosis not present

## 2020-10-05 DIAGNOSIS — I110 Hypertensive heart disease with heart failure: Secondary | ICD-10-CM | POA: Insufficient documentation

## 2020-10-05 DIAGNOSIS — Z87891 Personal history of nicotine dependence: Secondary | ICD-10-CM | POA: Insufficient documentation

## 2020-10-05 DIAGNOSIS — Z79899 Other long term (current) drug therapy: Secondary | ICD-10-CM | POA: Insufficient documentation

## 2020-10-05 DIAGNOSIS — J45909 Unspecified asthma, uncomplicated: Secondary | ICD-10-CM | POA: Diagnosis not present

## 2020-10-05 DIAGNOSIS — I2510 Atherosclerotic heart disease of native coronary artery without angina pectoris: Secondary | ICD-10-CM | POA: Diagnosis not present

## 2020-10-05 DIAGNOSIS — R079 Chest pain, unspecified: Secondary | ICD-10-CM | POA: Diagnosis not present

## 2020-10-05 DIAGNOSIS — R0602 Shortness of breath: Secondary | ICD-10-CM | POA: Insufficient documentation

## 2020-10-05 LAB — CBC
HCT: 36.5 % (ref 36.0–46.0)
Hemoglobin: 11.8 g/dL — ABNORMAL LOW (ref 12.0–15.0)
MCH: 32.2 pg (ref 26.0–34.0)
MCHC: 32.3 g/dL (ref 30.0–36.0)
MCV: 99.5 fL (ref 80.0–100.0)
Platelets: 353 10*3/uL (ref 150–400)
RBC: 3.67 MIL/uL — ABNORMAL LOW (ref 3.87–5.11)
RDW: 12.2 % (ref 11.5–15.5)
WBC: 10.6 10*3/uL — ABNORMAL HIGH (ref 4.0–10.5)
nRBC: 0 % (ref 0.0–0.2)

## 2020-10-05 LAB — BASIC METABOLIC PANEL
Anion gap: 16 — ABNORMAL HIGH (ref 5–15)
BUN: 42 mg/dL — ABNORMAL HIGH (ref 8–23)
CO2: 26 mmol/L (ref 22–32)
Calcium: 10 mg/dL (ref 8.9–10.3)
Chloride: 99 mmol/L (ref 98–111)
Creatinine, Ser: 1.87 mg/dL — ABNORMAL HIGH (ref 0.44–1.00)
GFR calc non Af Amer: 28 mL/min — ABNORMAL LOW (ref >60)
Glucose, Bld: 115 mg/dL — ABNORMAL HIGH (ref 70–99)
Potassium: 4.2 mmol/L (ref 3.5–5.1)
Sodium: 141 mmol/L (ref 135–145)

## 2020-10-05 LAB — TROPONIN I (HIGH SENSITIVITY)
Troponin I (High Sensitivity): 5 ng/L (ref <18)
Troponin I (High Sensitivity): 5 ng/L (ref <18)

## 2020-10-05 NOTE — ED Provider Notes (Signed)
[facility] EMERGENCY DEPARTMENT Provider Note   CSN: [id number] Arrival date & time: 10/05/20  1907     History Chief Complaint  Patient presents with  . Chest Pain    Sara NEALIS is a 64 y.o. female.  Patient referred in for a troponin that was 30 something.  Patient seen 2 days ago by her primary care doctor.  And also may have been seen yesterday.  Little bit of confusion on that.  Patient's had some chest discomfort pressure and some intermittent shortness of breath.  Chest discomforts been intermittent to.  And patient also has a history of COPD and some CHF.  No significant shortness of breath.  Oxygen saturations on room air 95%.  Pain reason was for coming in was that she was called about the elevated troponin.  Patient overall feeling a bit better today.  But did have some chest discomfort earlier today but none now        Past Medical History:  Diagnosis Date  . Adnexal cyst    Right simple cyst seen on [place]  . Anginal pain ([place])   . Anxiety   . Arthritis   . Asthma   . Atrial fibrillation (HCC)    hx of   . CHF (congestive heart failure) ([place])   . Chronic back pain   . Chronic hip pain   . COPD (chronic obstructive pulmonary disease) ([place])   . Coronary artery disease    stents placed   . Dyspnea   . GERD (gastroesophageal reflux disease)   . Headache    due to pinched nerve damaage   . Heart murmur    hx of slight murmur   . History of bronchitis   . History of cardiac catheterization    Normal coronaries 2013  . History of kidney stones   . Hot flashes   . Hypertension   . Hypertriglyceridemia   . IBS (irritable bowel syndrome)   . Internal hemorrhoids   . Morbid obesity ([place])    s/p lap band surgery  . Myocardial infarction ([place]) 12/2018   times 3  . Obesity   . Pulmonary HTN ([place])    [place] 2008:  RA pressures 13/10 with a mean of 7 mmHg.  RV pressure 01/74 with end diastolic pressure of 15 mmHg.  PA pressure 49/23 with a mean of 35 mmHg.   Pulmonary capillary wedge 17/50 with a mean of 14 mmHg. The PA saturation was 68%.  RA saturation was 71% and aortic saturation was 90%.  Cardiac output was 6.0 with a cardiac index of 2.80 by Fick.  Normal coronaries by cath 2008.  Marland Kitchen Renal insufficiency   . Sciatica of right side   . Sleep apnea    CPAP use does not know settings   . Trauma   . Type 2 diabetes mellitus ([place])    type II   . Uterine fibroid     Patient Active Problem List   Diagnosis Date Noted  . Abdominal bloating 09/22/2020  . Constipation 07/06/2020  . Fibromyalgia 04/20/2020  . Routine cervical smear 04/18/2020  . Encounter for gynecological examination with Papanicolaou smear of cervix 04/18/2020  . Encounter for colorectal cancer screening 04/18/2020  . Chronic female pelvic pain 04/18/2020  . Proteinuria 04/18/2020  . Bad odor of urine 04/18/2020  . Klebsiella pneumonia ([place]) 01/30/2019  . E. coli UTI (urinary tract infection) 01/30/2019  . Complicated UTI (urinary tract infection) 01/29/2019  . Levoscoliosis 11/20/2017  . Staghorn  kidney stones 08/08/2017  . GI bleed 05/29/2017  . Sepsis due to urinary tract infection ([place]) 05/29/2017  . UTI (urinary tract infection) 05/29/2017  . Cervicalgia 11/21/2016  . Chronic pain syndrome 10/11/2015  . Lumbar radiculopathy 07/25/2015  . Fibroid 10/13/2013  . Adnexal cyst 10/13/2013  . PMB (postmenopausal bleeding) 10/06/2013  . Unspecified symptom associated with female genital organs 10/06/2013  . Hot flashes 10/06/2013  . Internal hemorrhoids with other complication 14/48/1856  . 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 Hip pain 04/09/2013  . Hemorrhoids, internal 03/12/2013  . Back pain 03/11/2013  . Encounter for therapeutic drug monitoring 03/11/2013  . Trochanteric bursitis 03/11/2013  . Lumbar facet arthropathy 03/11/2013  . Unstable angina ([place]) 06/02/2012  . Acute on chronic renal insufficiency 06/02/2012  . Chest pain 04/22/2012  . IBS  (irritable bowel syndrome)-DIARRHEA 01/23/2012  . Morbid obesity ([place]) 08/09/2011  . Diabetes mellitus ([place]) 08/09/2011  . DIARRHEA 10/11/2010  . Chronic diastolic heart failure ([place]) 12/16/2007  . Pulmonary hypertension ([place]) 12/12/2007  . OSTEOARTHRITIS 12/11/2007  . SLEEP APNEA 12/11/2007    Past Surgical History:  Procedure Laterality Date  . accupuncture    . APPENDECTOMY    . CARDIAC CATHETERIZATION  07/2007, 05/2012   Normal coronary arteries  . COLONOSCOPY  10/18/2010   SLF:2-mm sessile sigmoid polyp/diverticula, tortuous colon.  Biopsies of sigmoid colon polyp and random colon biopsies benign  . COLONOSCOPY WITH PROPOFOL N/A 08/09/2020   Procedure: COLONOSCOPY WITH PROPOFOL;  Surgeon: Eloise Harman, DO;  Location: [facility] SUITE;  Service: Endoscopy;  Laterality: N/A;  11:00am  . CYSTOSCOPY/URETEROSCOPY/HOLMIUM LASER/STENT PLACEMENT Right 08/01/2017   Procedure: CYSTOSCOPY/ RIGHT URETEROSCOPY/ RIGHT RETROGRADE/ HOLMIUM LASER  AND RIGHT STENT PLACEMENT FIRST STAGE;  Surgeon: Irine Seal, MD;  Location: [facility] ORS;  Service: Urology;  Laterality: Right;  . CYSTOSCOPY/URETEROSCOPY/HOLMIUM LASER/STENT PLACEMENT Right 08/08/2017   Procedure: RIGHT URETEROSCOPY WITH HOLMIUM LASER RIGHT STENT PLACEMENT;  Surgeon: Irine Seal, MD;  Location: [facility] ORS;  Service: Urology;  Laterality: Right;  . EXTRACORPOREAL SHOCK WAVE LITHOTRIPSY Right 08/22/2017   Procedure: RIGHT EXTRACORPOREAL SHOCK WAVE LITHOTRIPSY (ESWL);  Surgeon: Alexis Frock, MD;  Location: [facility] ORS;  Service: Urology;  Laterality: Right;  . HEMORRHOID BANDING  07/2012   Dr. Oneida Alar  . LAPAROSCOPIC CHOLECYSTECTOMY  1985  . LAPAROSCOPIC GASTRIC BANDING  12/12/2010  . LEFT AND RIGHT HEART CATHETERIZATION WITH CORONARY ANGIOGRAM N/A 06/03/2012   Procedure: LEFT AND RIGHT HEART CATHETERIZATION WITH CORONARY ANGIOGRAM;  Surgeon: Jolaine Artist, MD;  Location: [facility] CATH LAB;  Service: Cardiovascular;  Laterality: N/A;  . PILONIDAL CYST  EXCISION  1974  . RIGHT/LEFT HEART CATH AND CORONARY ANGIOGRAPHY N/A 03/28/2020   Procedure: RIGHT/LEFT HEART CATH AND CORONARY ANGIOGRAPHY;  Surgeon: Jolaine Artist, MD;  Location: [facility] CV LAB;  Service: Cardiovascular;  Laterality: N/A;  . 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 TUMOR EXCISION  10/2011   Face     OB History    Gravida  0   Para  0   Term  0   Preterm  0   AB  0   Living  0     SAB  0   TAB  0   Ectopic  0   Multiple  0   Live Births              Family History  Problem Relation Age of Onset  . Diabetes Mother   . Heart failure Mother   .  Breast cancer Sister   . CAD Father   . Hypertension Father   . Heart failure Father   . Cancer Paternal Grandfather   . Hypertension Paternal Grandmother   . Stroke Paternal Grandmother   . Other Maternal Grandmother        tonsilitis  . Diabetes Maternal Grandfather   . Hypertension Maternal Grandfather   . Breast cancer Paternal Aunt     Social History   Tobacco Use  . Smoking status: Former Smoker    Types: Cigarettes    Quit date: 08/08/1996    Years since quitting: 24.1  . Smokeless tobacco: Never Used  Vaping Use  . Vaping Use: Never used  Substance Use Topics  . Alcohol use: No  . Drug use: No    Home Medications Prior to Admission medications   Medication Sig Start Date End Date Taking? Authorizing Provider  albuterol (PROVENTIL HFA;VENTOLIN HFA) 108 (90 BASE) MCG/ACT inhaler Inhale 2 puffs into the lungs every 6 (six) hours as needed for wheezing or shortness of breath.    Yes [provider]  albuterol (PROVENTIL) (2.5 MG/3ML) 0.083% nebulizer solution Take 2.5 mg by nebulization every 4 (four) hours as needed for wheezing or shortness of breath.  09/19/15  Yes [provider]  ALPRAZolam Duanne Moron) 0.5 MG tablet Take 0.5 mg by mouth daily as needed for anxiety.  06/08/20  Yes [provider]  amLODipine (NORVASC) 10 MG tablet Take 10 mg by mouth  every evening.  04/10/13  Yes [provider]  Ascorbic Acid (VITAMIN C) 500 MG CAPS Take 500 mg by mouth daily.    Yes [provider]  baclofen (LIORESAL) 10 MG tablet Take 1 tablet (10 mg total) by mouth 3 (three) times daily as needed for muscle spasms. 08/13/18  Yes Meredith Staggers, MD  cholecalciferol (VITAMIN D3) 25 MCG (1000 UNIT) tablet Take 1,000 Units by mouth daily.   Yes [provider]  clobetasol (TEMOVATE) 0.05 % external solution Apply 1 application topically daily as needed (scalp irritation).  04/15/19  Yes [provider]  DERMA-SMOOTHE/FS SCALP 0.01 % OIL Apply 1 application topically at bedtime.  04/20/20  Yes [provider]  desonide (DESOWEN) 0.05 % cream Apply 1 application topically 2 (two) times daily as needed (irritation).  04/20/20  Yes [provider]  EPINEPHrine 0.3 mg/0.3 mL IJ SOAJ injection Inject 0.3 mg into the muscle as needed for anaphylaxis.  05/27/19  Yes [provider]  fluticasone (FLONASE) 50 MCG/ACT nasal spray Place 1 spray into both nostrils daily.  06/07/20  Yes [provider]  furosemide (LASIX) 40 MG tablet Take 40 mg by mouth daily.  06/24/19  Yes [provider]  gabapentin (NEURONTIN) 600 MG tablet Take 600 mg by mouth 3 (three) times daily.  06/12/19  Yes [provider]  isosorbide mononitrate (IMDUR) 30 MG 24 hr tablet Take 30 mg by mouth daily.  09/05/11  Yes [provider]  linaclotide (LINZESS) 72 MCG capsule Take 72 mcg by mouth daily before breakfast.   Yes [provider]  loratadine (CLARITIN) 10 MG tablet Take 10 mg by mouth daily.  05/27/17  Yes [provider]  losartan-hydrochlorothiazide (HYZAAR) 100-25 MG tablet Take 1 tablet by mouth daily.   Yes [provider]  magnesium oxide (MAG-OX) 400 MG tablet Take 400 mg by mouth daily.    Yes [provider]  Multiple Vitamin (MULTIVITAMIN WITH MINERALS) TABS  tablet Take 1 tablet by  mouth daily.   Yes [provider]  nitroGLYCERIN (NITROSTAT) 0.4 MG SL tablet Place 0.4 mg under the tongue every 5 (five) minutes as needed for chest pain.    Yes [provider]  ondansetron (ZOFRAN) 4 MG tablet Take 4 mg by mouth every 8 (eight) hours as needed for nausea or vomiting.  11/05/19  Yes [provider]  oxyCODONE-acetaminophen (PERCOCET) 10-325 MG tablet Take 1 tablet by mouth 4 (four) times daily as needed for pain.  12/06/18  Yes [provider]  potassium chloride SA (K-DUR) 20 MEQ tablet Take 20 mEq by mouth daily.  06/12/19  Yes [provider]  SYMBICORT 160-4.5 MCG/ACT inhaler Take 2 puffs by mouth 2 (two) times daily. 06/17/19  Yes [provider]  TRULICITY 1.5 VZ/8.5YI SOPN Inject 1.5 mg into the skin every 7 (seven) days.  01/03/19  Yes [provider]  vitamin B-12 (CYANOCOBALAMIN) 1000 MCG tablet Take 1,000 mcg by mouth daily.   Yes [provider]  Vitamin D, Ergocalciferol, (DRISDOL) 1.25 MG (50000 UNIT) CAPS capsule Take 50,000 Units by mouth every 7 (seven) days.  06/09/20   [provider]    Allergies    Ancef [cefazolin], Bee venom, Meloxicam, Ceftin [cefuroxime axetil], Chicken meat (diagnostic), Ciprofloxacin, Macrodantin [nitrofurantoin], Meat [alpha-gal], Milk-related compounds, Nsaids, Other, and Latex  Review of Systems   Review of Systems  Constitutional: Negative for chills and fever.  HENT: Positive for congestion. Negative for rhinorrhea and sore throat.   Eyes: Negative for visual disturbance.  Respiratory: Positive for shortness of breath. Negative for cough.   Cardiovascular: Positive for chest pain. Negative for leg swelling.  Gastrointestinal: Negative for abdominal pain, diarrhea, nausea and vomiting.  Genitourinary: Negative for dysuria.  Musculoskeletal: Negative for back pain and neck pain.  Skin: Negative for rash.  Neurological: Negative  for dizziness, light-headedness and headaches.  Hematological: Does not bruise/bleed easily.  Psychiatric/Behavioral: Negative for confusion.    Physical Exam Updated Vital Signs BP (!) 123/93   Pulse 66   Temp 99.3 F (37.4 C) (Oral)   Resp 17   Ht 1.524 m (5')   Wt 120.2 kg   LMP 06/06/2013   SpO2 98%   BMI 51.75 kg/m   Physical Exam Vitals and nursing note reviewed.  Constitutional:      General: She is not in acute distress.    Appearance: Normal appearance. She is well-developed.  HENT:     Head: Normocephalic and atraumatic.  Eyes:     Extraocular Movements: Extraocular movements intact.     Conjunctiva/sclera: Conjunctivae normal.     Pupils: Pupils are equal, round, and reactive to light.  Cardiovascular:     Rate and Rhythm: Normal rate and regular rhythm.     Heart sounds: No murmur heard.   Pulmonary:     Effort: Pulmonary effort is normal. No respiratory distress.     Breath sounds: Normal breath sounds. No wheezing.  Chest:     Chest wall: No tenderness.  Abdominal:     Palpations: Abdomen is soft.     Tenderness: There is no abdominal tenderness.  Musculoskeletal:        General: Normal range of motion.     Cervical back: Normal range of motion and neck supple.  Skin:    General: Skin is warm and dry.  Neurological:     General: No focal deficit present.     Mental Status: She is alert and oriented to person, place, and  time.     Cranial Nerves: No cranial nerve deficit.     Sensory: No sensory deficit.     Motor: No weakness.     Coordination: Coordination normal.     ED Results / Procedures / Treatments   Labs (all labs ordered are listed, but only abnormal results are displayed) Labs Reviewed  BASIC METABOLIC PANEL - Abnormal; Notable for the following components:      Result Value   Glucose, Bld 115 (*)    BUN 42 (*)    Creatinine, Ser 1.87 (*)    GFR calc non Af Amer 28 (*)    Anion gap 16 (*)    All other components within normal  limits  CBC - Abnormal; Notable for the following components:   WBC 10.6 (*)    RBC 3.67 (*)    Hemoglobin 11.8 (*)    All other components within normal limits  TROPONIN I (HIGH SENSITIVITY)  TROPONIN I (HIGH SENSITIVITY)   Results for orders placed or performed during the hospital encounter of 54/27/06  Basic metabolic panel  Result Value Ref Range   Sodium 141 135 - 145 mmol/L   Potassium 4.2 3.5 - 5.1 mmol/L   Chloride 99 98 - 111 mmol/L   CO2 26 22 - 32 mmol/L   Glucose, Bld 115 (H) 70 - 99 mg/dL   BUN 42 (H) 8 - 23 mg/dL   Creatinine, Ser 1.87 (H) 0.44 - 1.00 mg/dL   Calcium 10.0 8.9 - 10.3 mg/dL   GFR calc non Af Amer 28 (L) >60 mL/min   Anion gap 16 (H) 5 - 15  CBC  Result Value Ref Range   WBC 10.6 (H) 4.0 - 10.5 K/uL   RBC 3.67 (L) 3.87 - 5.11 MIL/uL   Hemoglobin 11.8 (L) 12.0 - 15.0 g/dL   HCT 36.5 36 - 46 %   MCV 99.5 80.0 - 100.0 fL   MCH 32.2 26.0 - 34.0 pg   MCHC 32.3 30.0 - 36.0 g/dL   RDW 12.2 11.5 - 15.5 %   Platelets 353 150 - 400 K/uL   nRBC 0.0 0.0 - 0.2 %  Troponin I (High Sensitivity)  Result Value Ref Range   Troponin I (High Sensitivity) 5 <18 ng/L  Troponin I (High Sensitivity)  Result Value Ref Range   Troponin I (High Sensitivity) 5 <18 ng/L     EKG None   ED ECG REPORT   Date: 10/05/2020  Rate: 71  Rhythm: normal sinus rhythm  QRS Axis: left  Intervals: normal  ST/T Wave abnormalities: nonspecific T wave changes  Conduction Disutrbances:none  Narrative Interpretation:   Old EKG Reviewed: unchanged Borderline left axis deviation borderline T wave abnormalities.  No significant change from previous EKG. I have personally reviewed the EKG tracing and agree with the computerized printout as noted.   Radiology DG Chest 2 View  Result Date: 10/05/2020 CLINICAL DATA:  Chest pain EXAM: CHEST - 2 VIEW COMPARISON:  11/13/2019 FINDINGS: The heart size and mediastinal contours are within normal limits. Both lungs are clear. The  visualized skeletal structures are unremarkable. IMPRESSION: No active cardiopulmonary disease. Electronically Signed   By: Donavan Foil M.D.   On: 10/05/2020 20:18    Procedures Procedures (including critical care time)  Medications Ordered in ED Medications - No data to display  ED Course  I have reviewed the triage vital signs and the nursing notes.  Pertinent labs & imaging results that were available during my care  of the patient were reviewed by me and considered in my medical decision making (see chart for details).    MDM Rules/Calculators/A&P                          Patient's EKG without any acute changes.  Patient's chest x-ray without evidence of any acute abnormalities.  Troponins x2 here today were 5.  No significant delta change.  Patient followed by Ronna Polio from cardiology.  Patient's BUN and creatinine a little bit worse compared to August 6.  Will need close follow-up for the worsening kidney function.  But not significant for admission.  Patient nontoxic no acute distress.  Patient stable for discharge home.  Final Clinical Impression(s) / ED Diagnoses Final diagnoses:  Precordial pain    Rx / DC Orders ED Discharge Orders    None       Fredia Sorrow, MD 10/05/20 2325

## 2020-10-05 NOTE — Discharge Instructions (Addendum)
Work-up here today both troponins very normal.  No evidence of acute cardiac event.  Make an appointment to follow-up with your cardiologist in the next few days.  Return for any new or worse symptoms.

## 2020-10-05 NOTE — ED Notes (Signed)
Patient with no complaints at this time. Respirations even and unlabored. Skin warm/dry. Discharge instructions reviewed with patient at this time. Patient given opportunity to voice concerns/ask questions. IV removed per policy and band-aid applied to site. Patient discharged at this time and left Emergency Department with steady gait.  

## 2020-10-05 NOTE — ED Triage Notes (Signed)
Pt to er, pt states that she has some chest pain and shortness of breath, states that her md called her and told her that her trop was 30 something.

## 2020-10-13 ENCOUNTER — Other Ambulatory Visit (HOSPITAL_COMMUNITY): Payer: Self-pay | Admitting: *Deleted

## 2020-10-14 ENCOUNTER — Encounter: Payer: Medicare HMO | Attending: Physical Medicine & Rehabilitation | Admitting: Physical Medicine & Rehabilitation

## 2020-10-14 ENCOUNTER — Other Ambulatory Visit: Payer: Self-pay

## 2020-10-14 ENCOUNTER — Encounter: Payer: Self-pay | Admitting: Physical Medicine & Rehabilitation

## 2020-10-14 VITALS — BP 131/83 | HR 81 | Temp 97.9°F | Ht 60.0 in | Wt 264.6 lb

## 2020-10-14 DIAGNOSIS — R200 Anesthesia of skin: Secondary | ICD-10-CM | POA: Insufficient documentation

## 2020-10-14 DIAGNOSIS — G5601 Carpal tunnel syndrome, right upper limb: Secondary | ICD-10-CM

## 2020-10-14 DIAGNOSIS — R202 Paresthesia of skin: Secondary | ICD-10-CM | POA: Diagnosis not present

## 2020-10-14 NOTE — Progress Notes (Signed)
Acupuncture treatment #9 Indication right hand paresthesias and wrist pain Needles placed at Th4-Th5 T8 8 as well as MH 6 and MH 7 electrical stimulation at 100 Hz between TH 4 and MH6 as well as between Th5 and MH 7 Patient tolerated procedure well Post procedure instructions given Total treatment time 25 minutes  You will need up to 12 visits with this protocol.  If no resolution after 12 visits would rec f/u with Dr Naaman Plummer to consider othr treatments such as carpal tunnel injection

## 2020-10-14 NOTE — Patient Instructions (Addendum)
You have had 9 acupuncture  visits You will need up to 12 visits with this protocol.  If no resolution after 12 visits would rec f/u with Dr Naaman Plummer to consider othr treatments such as carpal tunnel injection

## 2020-11-04 ENCOUNTER — Encounter: Payer: Medicare HMO | Attending: Physical Medicine & Rehabilitation | Admitting: Physical Medicine & Rehabilitation

## 2020-11-04 DIAGNOSIS — G5601 Carpal tunnel syndrome, right upper limb: Secondary | ICD-10-CM | POA: Insufficient documentation

## 2020-11-04 DIAGNOSIS — R202 Paresthesia of skin: Secondary | ICD-10-CM | POA: Insufficient documentation

## 2020-11-04 DIAGNOSIS — R200 Anesthesia of skin: Secondary | ICD-10-CM | POA: Insufficient documentation

## 2020-11-10 ENCOUNTER — Ambulatory Visit: Payer: Medicare HMO | Admitting: Internal Medicine

## 2020-11-11 ENCOUNTER — Institutional Professional Consult (permissible substitution): Payer: Medicare HMO | Admitting: Pulmonary Disease

## 2020-11-22 ENCOUNTER — Ambulatory Visit: Payer: Medicare HMO | Admitting: Gastroenterology

## 2020-11-22 ENCOUNTER — Encounter: Payer: Self-pay | Admitting: Internal Medicine

## 2020-11-29 DIAGNOSIS — E1129 Type 2 diabetes mellitus with other diabetic kidney complication: Secondary | ICD-10-CM | POA: Diagnosis not present

## 2020-11-29 DIAGNOSIS — J449 Chronic obstructive pulmonary disease, unspecified: Secondary | ICD-10-CM | POA: Diagnosis not present

## 2020-11-29 DIAGNOSIS — I129 Hypertensive chronic kidney disease with stage 1 through stage 4 chronic kidney disease, or unspecified chronic kidney disease: Secondary | ICD-10-CM | POA: Diagnosis not present

## 2020-11-29 DIAGNOSIS — E559 Vitamin D deficiency, unspecified: Secondary | ICD-10-CM | POA: Diagnosis not present

## 2020-11-29 DIAGNOSIS — I10 Essential (primary) hypertension: Secondary | ICD-10-CM | POA: Diagnosis not present

## 2020-11-29 DIAGNOSIS — E7849 Other hyperlipidemia: Secondary | ICD-10-CM | POA: Diagnosis not present

## 2020-11-29 DIAGNOSIS — Z23 Encounter for immunization: Secondary | ICD-10-CM | POA: Diagnosis not present

## 2020-11-29 DIAGNOSIS — Z6841 Body Mass Index (BMI) 40.0 and over, adult: Secondary | ICD-10-CM | POA: Diagnosis not present

## 2020-12-08 ENCOUNTER — Other Ambulatory Visit: Payer: Self-pay

## 2020-12-08 ENCOUNTER — Encounter: Payer: Self-pay | Admitting: Physical Medicine & Rehabilitation

## 2020-12-08 ENCOUNTER — Encounter: Payer: Medicare HMO | Attending: Physical Medicine & Rehabilitation | Admitting: Physical Medicine & Rehabilitation

## 2020-12-08 VITALS — BP 122/78 | HR 97 | Temp 98.0°F | Ht 60.0 in | Wt 252.8 lb

## 2020-12-08 DIAGNOSIS — M25531 Pain in right wrist: Secondary | ICD-10-CM

## 2020-12-08 DIAGNOSIS — G5601 Carpal tunnel syndrome, right upper limb: Secondary | ICD-10-CM | POA: Diagnosis not present

## 2020-12-08 DIAGNOSIS — R202 Paresthesia of skin: Secondary | ICD-10-CM

## 2020-12-08 DIAGNOSIS — G8921 Chronic pain due to trauma: Secondary | ICD-10-CM | POA: Insufficient documentation

## 2020-12-08 DIAGNOSIS — W230XXA Caught, crushed, jammed, or pinched between moving objects, initial encounter: Secondary | ICD-10-CM | POA: Diagnosis not present

## 2020-12-08 NOTE — Progress Notes (Signed)
Acupuncture treatment #10 Indication right hand paresthesias and wrist pain Needles placed at Th4-Th5 T8  as well as MH 6 and MH 7 electrical stimulation at 100 Hz between TH 4 and MH6 as well as between Th5 and MH 7 Patient tolerated procedure well Post procedure instructions given Total treatment time 25 minutes  You will need up to 12 visits with this protocol.  If no resolution after 12 visits would rec f/u with Dr Naaman Plummer to consider othr treatments such as carpal tunnel injection

## 2020-12-08 NOTE — Patient Outreach (Signed)
[facility]) Care Management  12/08/2020  Sara Tran 06-12-1956 [id number]   Telephone Assessment Quarterly Call   Outreach attempt # 1 to patient. Spoke briefly with patient who reported she was busy and requested call back at another time.       Plan: RN CM will make outreach attempt to patient within the month of Jan if no return call from patient.    Enzo Montgomery, RN,BSN,CCM [facility] Telephonic Care Management Coordinator Direct Phone: [phone] Toll Free: [phone] Fax: [phone]

## 2020-12-13 ENCOUNTER — Ambulatory Visit (INDEPENDENT_AMBULATORY_CARE_PROVIDER_SITE_OTHER): Payer: Medicare HMO | Admitting: Internal Medicine

## 2020-12-13 ENCOUNTER — Other Ambulatory Visit: Payer: Self-pay

## 2020-12-13 ENCOUNTER — Encounter: Payer: Self-pay | Admitting: Internal Medicine

## 2020-12-13 DIAGNOSIS — R0600 Dyspnea, unspecified: Secondary | ICD-10-CM

## 2020-12-13 DIAGNOSIS — G478 Other sleep disorders: Secondary | ICD-10-CM | POA: Diagnosis not present

## 2020-12-13 DIAGNOSIS — Z9989 Dependence on other enabling machines and devices: Secondary | ICD-10-CM

## 2020-12-13 DIAGNOSIS — G4733 Obstructive sleep apnea (adult) (pediatric): Secondary | ICD-10-CM

## 2020-12-13 DIAGNOSIS — I2729 Other secondary pulmonary hypertension: Secondary | ICD-10-CM

## 2020-12-13 DIAGNOSIS — R0609 Other forms of dyspnea: Secondary | ICD-10-CM | POA: Insufficient documentation

## 2020-12-13 NOTE — Assessment & Plan Note (Addendum)
Onset around 2013 - PFT's  03/22/12  FEV1  1.66 (88 % ) ratio 0.87  p 0 prior to study with DLCO  14.27 (75%) corrects to 5.45 (128%)  for alv volume and FV curve nl exp/ somewhat truncated insp portion s true plateau - [facility]  03/28/20  PA  42/17 with mean 29 and wedge 13 and PRV  3.0 wu  - 12/13/2020  After extensive coaching inhaler device,  effectiveness =    25% from a basline of nearly 0 -  12/13/2020   Walked RA  approx   300 ft  @ slow/moderate pace  stopped due to  Sob with sats still 94%     Symptoms are markedly disproportionate to objective findings and not clear to what extent this is actually a pulmonary  problem but pt does appear to have difficult to sort out respiratory symptoms of unknown origin for which  DDX  = almost all start with A and  include Adherence, Ace Inhibitors, Acid Reflux, Active Sinus Disease, Alpha 1 Antitripsin deficiency, Anxiety masquerading as Airways dz,  ABPA,  Allergy(esp in young), Aspiration (esp in elderly), Adverse effects of meds,  Active smoking or Vaping, A bunch of PE's/clot burden (a few small clots can't cause this syndrome unless there is already severe underlying pulm or vascular dz with poor reserve),  Anemia or thyroid disorder, plus two Bs  = Bronchiectasis and Beta blocker use..and one C= CHF     Adherence is always the initial "prime suspect" and is a multilayered concern that requires a "trust but verify" approach in every patient - starting with knowing how to use medications, especially inhalers, correctly, keeping up with refills and understanding the fundamental difference between maintenance and prns vs those medications only taken for a very short course and then stopped and not refilled.  - see hfa teaching - if not improving with better hfa, return with all meds in hand using a trust but verify approach to confirm accurate Medication  Reconciliation The principal here is that until we are certain that the  patients are doing what we've asked,  it makes no sense to ask them to do more.    If not improving with rx of asthma next consider: ? Acid (or non-acid) GERD > always difficult to exclude as up to 75% of pts in some series report no assoc GI/ Heartburn symptoms> rec max (24h)  acid suppression/ diet /wt loss  ? Asthma/ allergy >  symbicort 160 best choice for now Re saba: I spent extra time with pt today reviewing appropriate use of albuterol for prn use on exertion with the following points: 1) saba is for relief of sob that does not improve by walking a slower pace or resting but rather if the pt does not improve after trying this first. 2) If the pt is convinced, as many are, that saba helps recover from activity faster then it's easy to tell if this is the case by re-challenging : ie stop, take the inhaler, then p 5 minutes try the exact same activity (intensity of workload) that just caused the symptoms and see if they are substantially diminished or not after saba 3) if there is an activity that reproducibly causes the symptoms, try the saba 15 min before the activity on alternate days   If in fact the saba really does help, then fine to continue to use it prn but advised may need to look closer at the maintenance regimen being used to  achieve better control of airways disease with exertion.    ? Anemia/ thryoid dz   Lab Results  Component Value Date   HGB 11.8 (L) 10/05/2020   HGB 12.9 03/28/2020   HGB 12.9 03/28/2020    Lab Results  Component Value Date   TSH 1.875 05/29/2017      ? A bunch of PEs  > needs Q scan to complete the w/u  ? chf > note  pcwp nl , see PH

## 2020-12-13 NOTE — Progress Notes (Signed)
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      History of Present Illness  12/13/2020  Pulmonary/ 1st office eval/ Onita Pfluger / [place] [facility]  Chief Complaint  Patient presents with  . Pulmonary Consult    Referred by Dr Hilma Favors for eval of pulmonary hypertension. She is a former pt of Dr [name]. Breathing has ben worse since weather started turning colder. She states she gets winded walking approx 10 ft.   Dyspnea: 10 ft flat even on symbicort  Cough: some yellow /assoc nasal congestion already on rx for flare by PCP  Sleep: on cpap by [facility] - no recent downloads  SABA use: albuterol seems to help > symbiocort but hfa very poor for both  02 : none "but I need some"   No obvious day to day or daytime variability or assoc   mucus plugs or hemoptysis or cp or chest tightness, subjective wheeze or overt sinus or hb symptoms.   Also denies any obvious fluctuation of symptoms with weather or environmental changes or other aggravating or alleviating factors except as outlined above   No unusual exposure hx or h/o childhood pna  or knowledge of premature birth.  Current Allergies, Complete Past Medical History, Past Surgical History, Family History, and Social History were reviewed in [employer] record.  ROS  The following are not active complaints unless bolded Hoarseness, sore throat, dysphagia, dental problems, itching, sneezing,  nasal congestion or discharge of excess mucus or purulent secretions, ear ache,   fever, chills, sweats, unintended wt loss or wt gain, classically pleuritic or exertional cp,   orthopnea pnd or arm/hand swelling  or leg swelling, presyncope, palpitations, abdominal pain, anorexia, nausea, vomiting, diarrhea  or change in bowel habits or change in bladder habits, change in stools or change in urine, dysuria, hematuria,  rash, arthralgias, visual complaints, headache, numbness, weakness or ataxia or problems with walking or coordination,  change in mood or  memory.            Past Medical History:  Diagnosis Date  . Adnexal cyst    Right simple cyst seen on [place]  . Anginal pain ([place])   . Anxiety   . Arthritis   . Asthma   . Atrial fibrillation (HCC)    hx of   . CHF (congestive heart failure) ([place])   . Chronic back pain   . Chronic hip pain   . COPD (chronic obstructive pulmonary disease) ([place])   . Coronary artery disease    stents placed   . Dyspnea   . GERD (gastroesophageal reflux disease)   . Headache    due to pinched nerve damaage   . Heart murmur    hx of slight murmur   . History of bronchitis   . History of cardiac catheterization    Normal coronaries 2013  . History of kidney stones   . Hot flashes   . Hypertension   . Hypertriglyceridemia   . IBS (irritable bowel syndrome)   .  Internal hemorrhoids   . Morbid obesity ([place])    s/p lap band surgery  . Myocardial infarction ([place]) 12/2018   times 3  . Obesity   . Pulmonary HTN ([place])    [place] 2008:  RA pressures 13/10 with a mean of 7 mmHg.  RV pressure 59/56 with end diastolic pressure of 15 mmHg.  PA pressure 49/23 with a mean of 35 mmHg.  Pulmonary capillary wedge 17/50 with a mean of 14 mmHg. The PA saturation was 68%.  RA saturation was 71% and aortic saturation was 90%.  Cardiac output was 6.0 with a cardiac index of 2.80 by [place]ick.  Normal coronaries by cath 2008.  Marland Kitchen Renal insufficiency   . Sciatica of right side   . Sleep apnea    CPAP use does not know settings   . Trauma   . Type 2 diabetes mellitus ([place])    type II   . Uterine fibroid     Outpatient Medications Prior to Visit   Medication Sig Dispense Refill  . albuterol (PROVENTIL HFA;VENTOLIN HFA) 108 (90 BASE) MCG/ACT inhaler Inhale 2 puffs into the lungs every 6 (six) hours as needed for wheezing or shortness of breath.     Marland Kitchen albuterol (PROVENTIL) (2.5 MG/3ML) 0.083% nebulizer solution Take 2.5 mg by nebulization every 4 (four) hours as needed for wheezing or shortness of breath.     . ALPRAZolam (XANAX) 0.5 MG tablet Take 0.5 mg by mouth daily as needed for anxiety.     Marland Kitchen amLODipine (NORVASC) 10 MG tablet Take 10 mg by mouth every evening.     . Ascorbic Acid (VITAMIN C) 500 MG CAPS Take 500 mg by mouth daily.     . baclofen (LIORESAL) 10 MG tablet Take 1 tablet (10 mg total) by mouth 3 (three) times daily as needed for muscle spasms. 45 tablet 3  . cholecalciferol (VITAMIN D3) 25 MCG (1000 UNIT) tablet Take 1,000 Units by mouth daily.    . clobetasol (TEMOVATE) 0.05 % external solution Apply 1 application topically daily as needed (scalp irritation).     . DERMA-SMOOTHE/FS SCALP 0.01 % OIL Apply 1 application topically at bedtime.     Marland Kitchen desonide (DESOWEN) 0.05 % cream Apply 1 application topically 2 (two) times daily as needed (irritation).     Marland Kitchen EPINEPHrine 0.3 mg/0.3 mL IJ SOAJ injection Inject 0.3 mg into the muscle as needed for anaphylaxis.     . fluticasone (FLONASE) 50 MCG/ACT nasal spray Place 1 spray into both nostrils daily.     . furosemide (LASIX) 40 MG tablet Take 40 mg by mouth daily as needed.    . gabapentin (NEURONTIN) 600 MG tablet Take 600 mg by mouth 3 (three) times daily.     . isosorbide mononitrate (IMDUR) 30 MG 24 hr tablet Take 30 mg by mouth daily.     Marland Kitchen linaclotide (LINZESS) 72 MCG capsule Take 72 mcg by mouth daily before breakfast.    . loratadine (CLARITIN) 10 MG tablet Take 10 mg by mouth daily.    Marland Kitchen losartan-hydrochlorothiazide (HYZAAR) 100-25 MG tablet Take 1 tablet by mouth daily.    . magnesium oxide (MAG-OX) 400 MG tablet Take 400 mg by mouth daily.     . methylPREDNISolone  (MEDROL DOSEPAK) 4 MG TBPK tablet Take by mouth as directed.    . Multiple Vitamin (MULTIVITAMIN WITH MINERALS) TABS tablet Take 1 tablet by mouth daily.    . nitroGLYCERIN (NITROSTAT) 0.4 MG SL tablet Place 0.4 mg under  the tongue every 5 (five) minutes as needed for chest pain.     Marland Kitchen ondansetron (ZOFRAN) 4 MG tablet Take 4 mg by mouth every 8 (eight) hours as needed for nausea or vomiting.     Marland Kitchen oxyCODONE-acetaminophen (PERCOCET) 10-325 MG tablet Take 1 tablet by mouth 4 (four) times daily as needed for pain.     . potassium chloride SA (K-DUR) 20 MEQ tablet Take 20 mEq by mouth daily.     . SYMBICORT 160-4.5 MCG/ACT inhaler Take 2 puffs by mouth 2 (two) times daily.    . TRULICITY 1.5 IZ/1.2WP SOPN Inject 1.5 mg into the skin every 7 (seven) days.     . vitamin B-12 (CYANOCOBALAMIN) 1000 MCG tablet Take 1,000 mcg by mouth daily.    . Vitamin D, Ergocalciferol, (DRISDOL) 1.25 MG (50000 UNIT) CAPS capsule Take 50,000 Units by mouth every 7 (seven) days.      No facility-administered medications prior to visit.     Objective:     BP 128/66 (BP Location: Left Arm, Cuff Size: Large)   Pulse 81   Temp 97.6 F (36.4 C) (Temporal)   Ht 5' (1.524 m)   Wt 253 lb (114.8 kg)   LMP 06/06/2013   SpO2 97% Comment: on RA  BMI 49.41 kg/m   SpO2: 97 % (on RA)   Obese bf who had trouble answering questions in a straightforward manner, tending to go off on tangents or answer questions with ambiguous medical terms or diagnoses and seemed perplexexed but not aggravated  when asked the same question more than once for clarification.eg Q Are you sob walking in house  A Well my 02 levels drop in the 70s    HEENT : pt wearing mask not removed for exam due to covid -19 concerns.    NECK :  without JVD/Nodes/TM/ nl carotid upstrokes bilaterally   LUNGS: no acc muscle use,  Nl contour chest which is clear to A and P bilaterally without cough on insp or exp maneuvers   CV:  RRR  no s3 or murmur  - no  def  increase in P2, and no edema   ABD:  Obese soft and nontender with nl inspiratory excursion in the supine position. No bruits or organomegaly appreciated, bowel sounds nl  MS:  Nl gait/ ext warm without deformities, calf tenderness, cyanosis or clubbing No obvious joint restrictions   SKIN: warm and dry without lesions    NEURO:  alert, approp, nl sensorium with  no motor or cerebellar deficits apparent.     I personally reviewed images and agree with radiology impression as follows:  CXR:   Pa and lat  10/05/20 No active cardiopulmonary disease.        Assessment   DOE (dyspnea on exertion) Onset around 2013 - PFT's  03/22/12  FEV1  1.66 (88 % ) ratio 0.87  p 0 prior to study with DLCO  14.27 (75%) corrects to 5.45 (128%)  for alv volume and FV curve nl exp/ somewhat truncated insp portion s true plateau - [facility]  03/28/20  PA  42/17 with mean 29 and wedge 13 and PRV  3.0 wu  - 12/13/2020  After extensive coaching inhaler device,  effectiveness =    25% from a basline of nearly 0 -  12/13/2020   Walked RA  approx   300 ft  @ slow/moderate pace  stopped due to  Sob with sats still 94%     Symptoms are markedly disproportionate  to objective findings and not clear to what extent this is actually a pulmonary  problem but pt does appear to have difficult to sort out respiratory symptoms of unknown origin for which  DDX  = almost all start with A and  include Adherence, Ace Inhibitors, Acid Reflux, Active Sinus Disease, Alpha 1 Antitripsin deficiency, Anxiety masquerading as Airways dz,  ABPA,  Allergy(esp in young), Aspiration (esp in elderly), Adverse effects of meds,  Active smoking or Vaping, A bunch of PE's/clot burden (a few small clots can't cause this syndrome unless there is already severe underlying pulm or vascular dz with poor reserve),  Anemia or thyroid disorder, plus two Bs  = Bronchiectasis and Beta blocker use..and one C= CHF     Adherence is always the initial "prime  suspect" and is a multilayered concern that requires a "trust but verify" approach in every patient - starting with knowing how to use medications, especially inhalers, correctly, keeping up with refills and understanding the fundamental difference between maintenance and prns vs those medications only taken for a very short course and then stopped and not refilled.  - see hfa teaching - if not improving with better hfa, return with all meds in hand using a trust but verify approach to confirm accurate Medication  Reconciliation The principal here is that until we are certain that the  patients are doing what we've asked, it makes no sense to ask them to do more.    If not improving with rx of asthma next consider: ? Acid (or non-acid) GERD > always difficult to exclude as up to 75% of pts in some series report no assoc GI/ Heartburn symptoms> rec max (24h)  acid suppression/ diet /wt loss  ? Asthma/ allergy >  symbicort 160 best choice for now Re saba: I spent extra time with pt today reviewing appropriate use of albuterol for prn use on exertion with the following points: 1) saba is for relief of sob that does not improve by walking a slower pace or resting but rather if the pt does not improve after trying this first. 2) If the pt is convinced, as many are, that saba helps recover from activity faster then it's easy to tell if this is the case by re-challenging : ie stop, take the inhaler, then p 5 minutes try the exact same activity (intensity of workload) that just caused the symptoms and see if they are substantially diminished or not after saba 3) if there is an activity that reproducibly causes the symptoms, try the saba 15 min before the activity on alternate days   If in fact the saba really does help, then fine to continue to use it prn but advised may need to look closer at the maintenance regimen being used to achieve better control of airways disease with exertion.    ? Anemia/ thryoid  dz   Lab Results  Component Value Date   HGB 11.8 (L) 10/05/2020   HGB 12.9 03/28/2020   HGB 12.9 03/28/2020    Lab Results  Component Value Date   TSH 1.875 05/29/2017      ? A bunch of PEs  > needs Q scan to complete the w/u  ? 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    Lab Results  Component Value Date   TSH 1.875 05/29/2017     Contributing to doe for sure plus risk of  gerd risk/ dvt/pe /reviewed the need and the process to achieve and maintain neg calorie balance > defer f/u primary care including intermittently monitoring thyroid status          Each maintenance medication was reviewed in detail including emphasizing most importantly the difference between maintenance and prns and under what circumstances the prns are to be triggered using an action plan format where appropriate.  Total time for H and P, chart review, counseling, teaching device  directly observing portions of ambulatory 02 saturation study/  and generating customized AVS unique to this office visit / charting =  74 min           Christinia Gully, MD 12/13/2020

## 2020-12-13 NOTE — Assessment & Plan Note (Addendum)
See [place]  03/28/20  Mild with PRV 3.0 woods units  Since she does not have copd by prior pfts but does carry dx of osa with MO the only other concern would be occult TEPAH in this setting and technically needs to complete the w/u with a perfusion scan then focus on wt loss/ optimal noct 02/ cpap settings

## 2020-12-13 NOTE — Assessment & Plan Note (Addendum)
Original rx by Clance 2009 > referred to sleep medicine 12/13/2020   >>>  Advised this needs to be corrected/ optimized before study for noct 02 need

## 2020-12-13 NOTE — Patient Instructions (Addendum)
We will walk you today to see if you qualify for oxygen.  We will set you up to see one of our sleep doctors who can also see if you need night time 02 but first be sure the cpap is set up correctly.  You not have copd and probably have just a mild asthma you just have not learned how to use your medication correctly.  Plan A = Automatic = Always=    Symbicort 160 Take 2 puffs first thing in am and then another 2 puffs about 12 hours later.   Work on inhaler technique:  relax and gently blow all the way out then take a nice smooth deep breath back in, triggering the inhaler at same time you start breathing in.  Hold for up to 5 seconds if you can. Blow out thru nose. Rinse and gargle with water when done      Plan B = Backup (to supplement plan A, not to replace it) Only use your albuterol inhaler as a rescue medication to be used if you can't catch your breath by resting or doing a relaxed purse lip breathing pattern.  - The less you use it, the better it will work when you need it. - Ok to use the inhaler up to 2 puffs  every 4 hours if you must but call for appointment if use goes up over your usual need - Don't leave home without it !!  (think of it like the spare tire for your car)   Plan C = Crisis (instead of Plan B but only if Plan B stops working) - only use your albuterol nebulizer if you first try Plan B and it fails to help > ok to use the nebulizer up to every 4 hours but if start needing it regularly call for immediate appointment  Late add: vq scan to complete the w/u for doe/PH

## 2020-12-13 NOTE — Assessment & Plan Note (Signed)
Body mass index is 49.41 kg/m.    Lab Results  Component Value Date   TSH 1.875 05/29/2017     Contributing to doe for sure plus risk of  gerd risk/ dvt/pe /reviewed the need and the process to achieve and maintain neg calorie balance > defer f/u primary care including intermittently monitoring thyroid status            Each maintenance medication was reviewed in detail including emphasizing most importantly the difference between maintenance and prns and under what circumstances the prns are to be triggered using an action plan format where appropriate.  Total time for H and P, chart review, counseling, teaching device  directly observing portions of ambulatory 02 saturation study/  and generating customized AVS unique to this office visit / charting =  74 min

## 2020-12-14 ENCOUNTER — Telehealth: Payer: Self-pay | Admitting: *Deleted

## 2020-12-14 DIAGNOSIS — R0602 Shortness of breath: Secondary | ICD-10-CM

## 2020-12-14 NOTE — Telephone Encounter (Signed)
-----   Message from Tanda Rockers, MD sent at 12/13/2020  1:09 PM EST ----- After review of records needs VQ scan at her convenience to r/o occult PE

## 2020-12-14 NOTE — Telephone Encounter (Signed)
Pt aware of recs per Dr Melvyn Novas and states okay to proceed with scan and I have placed the order.

## 2020-12-21 ENCOUNTER — Telehealth: Payer: Self-pay | Admitting: Physical Medicine and Rehabilitation

## 2020-12-21 ENCOUNTER — Encounter (HOSPITAL_COMMUNITY)
Admission: RE | Admit: 2020-12-21 | Discharge: 2020-12-21 | Disposition: A | Discharge status: Home / Self Care | Payer: Medicare HMO | Source: Ambulatory Visit | Attending: Internal Medicine | Admitting: Internal Medicine

## 2020-12-21 ENCOUNTER — Other Ambulatory Visit: Payer: Self-pay

## 2020-12-21 ENCOUNTER — Ambulatory Visit (HOSPITAL_COMMUNITY)
Admission: RE | Admit: 2020-12-21 | Discharge: 2020-12-21 | Disposition: A | Discharge status: Home / Self Care | Payer: Medicare HMO | Source: Ambulatory Visit | Attending: Internal Medicine | Admitting: Internal Medicine

## 2020-12-21 DIAGNOSIS — R0602 Shortness of breath: Secondary | ICD-10-CM

## 2020-12-21 MED ORDER — TECHNETIUM TO 99M ALBUMIN AGGREGATED
4.2000 | Freq: Once | INTRAVENOUS | Status: AC | PRN
  Administered 2020-12-21: 4.2 via INTRAVENOUS

## 2020-12-21 NOTE — Progress Notes (Signed)
LMTCB

## 2020-12-21 NOTE — Telephone Encounter (Signed)
Spoke with rep w/ [employer], advised records& bills have been sent twice now.most recently 11/15. She was able to pull that up and see they are in receipt of them. [phone]/[id number]#[id number]

## 2020-12-22 ENCOUNTER — Ambulatory Visit: Payer: Medicare HMO | Admitting: Pulmonary Disease

## 2021-01-17 ENCOUNTER — Ambulatory Visit: Payer: Medicare HMO | Admitting: Physical Medicine & Rehabilitation

## 2021-01-18 ENCOUNTER — Other Ambulatory Visit: Payer: Self-pay

## 2021-01-18 DIAGNOSIS — G894 Chronic pain syndrome: Secondary | ICD-10-CM | POA: Diagnosis not present

## 2021-01-18 DIAGNOSIS — I10 Essential (primary) hypertension: Secondary | ICD-10-CM | POA: Diagnosis not present

## 2021-01-18 NOTE — Patient Outreach (Signed)
[facility]) Care Management  01/18/2021  EMALIE MCWETHY Jun 17, 1956 [id number]   Telephone Assessment   Unsuccessful outreach attempt to patient.    Plan: RN CM will make outreach attempt to patient within the month of March.  Enzo Montgomery, RN,BSN,CCM [place] Management Telephonic Care Management Coordinator Direct Phone: [phone] Toll Free: [phone] Fax: [phone]

## 2021-01-19 ENCOUNTER — Ambulatory Visit: Payer: Medicare HMO | Admitting: Pulmonary Disease

## 2021-01-25 ENCOUNTER — Encounter: Payer: Medicare HMO | Admitting: Physical Medicine & Rehabilitation

## 2021-02-09 DIAGNOSIS — N183 Chronic kidney disease, stage 3 unspecified: Secondary | ICD-10-CM | POA: Diagnosis not present

## 2021-02-09 DIAGNOSIS — D72828 Other elevated white blood cell count: Secondary | ICD-10-CM | POA: Diagnosis not present

## 2021-02-17 ENCOUNTER — Ambulatory Visit: Payer: Medicare HMO | Admitting: Pulmonary Disease

## 2021-03-07 DIAGNOSIS — G894 Chronic pain syndrome: Secondary | ICD-10-CM | POA: Diagnosis not present

## 2021-03-20 ENCOUNTER — Other Ambulatory Visit: Payer: Self-pay

## 2021-03-20 NOTE — Patient Outreach (Signed)
[facility]) Care Management  03/20/2021  Sara Tran December 16, 1956 [id number]   Telephone Assessment Quarterly Call Annual Assessment    Successful outreach to patient. 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 RN CM assessed for caregiver fatigue/burnout. Patient is getting some services/assistnce in the home to help care for him. She statesshe saw PCP earlier this year. She got a good report. She remains independent with ADLs/IADLs. No recent falls. She denies any RN CM needs or concerns at this time.   Medications Reviewed Today    Reviewed by Hayden Pedro, RN (Registered Nurse) on 03/20/21 at 1034  Med List Status: <None>  Medication Order Taking? Sig Documenting Provider Last Dose Status Informant  albuterol (PROVENTIL HFA;VENTOLIN HFA) 108 (90 BASE) MCG/ACT inhaler [id number] No Inhale 2 puffs into the lungs every 6 (six) hours as needed for wheezing or shortness of breath.  [provider] Taking Active Self  albuterol (PROVENTIL) (2.5 MG/3ML) 0.083% nebulizer solution [id number] No Take 2.5 mg by nebulization every 4 (four) hours as needed for wheezing or shortness of breath.  [provider] Taking Active Self           Med Note [name], JESSICA R   Tue Jul 23, 2017  2:17 PM)    ALPRAZolam (XANAX) 0.5 MG tablet [id number] No Take 0.5 mg by mouth daily as needed for anxiety.  [provider] Taking Active Self  amLODipine (NORVASC) 10 MG tablet [id number] No Take 10 mg by mouth every evening.  [provider] Taking Active Self  Ascorbic Acid (VITAMIN C) 500 MG CAPS [id number] No Take 500 mg by mouth daily.  [provider] Taking Active Self  baclofen (LIORESAL) 10 MG tablet [id number] No Take 1 tablet (10 mg total) by mouth 3 (three) times daily as needed for muscle spasms. Meredith Staggers, MD Taking Active  Self  cholecalciferol (VITAMIN D3) 25 MCG (1000 UNIT) tablet [id number] No Take 1,000 Units by mouth daily. [provider] Taking Active Self  clobetasol (TEMOVATE) 0.05 % external solution [id number] No Apply 1 application topically daily as needed (scalp irritation).  [provider] Taking Active Self  DERMA-SMOOTHE/FS SCALP 0.01 % OIL [id number] No Apply 1 application topically at bedtime.  [provider] Taking Active Self  desonide (DESOWEN) 0.05 % cream [id number] No Apply 1 application topically 2 (two) times daily as needed (irritation).  [provider] Taking Active Self  EPINEPHrine 0.3 mg/0.3 mL IJ SOAJ injection [id number] No Inject 0.3 mg into the muscle as needed for anaphylaxis.  [provider] Taking Active Self           Med Note [name], [name]   Wed Jul 06, 2020  1:23 PM)    fluticasone (FLONASE) 50 MCG/ACT nasal spray [id number] No Place 1 spray into both nostrils daily.  [provider] Taking Active Self  furosemide (LASIX) 40 MG tablet [id number] No Take 40 mg by mouth daily as needed. [provider] Taking Active Self  gabapentin (NEURONTIN) 600 MG tablet [id number] No Take 600 mg by mouth 3 (three) times daily.  [provider] Taking Active Self  isosorbide mononitrate (IMDUR) 30 MG 24 hr tablet [id number] No Take 30 mg by mouth daily.  [provider] Taking Active Self  linaclotide (LINZESS) 72 MCG capsule [id number] No Take 72 mcg by mouth daily before breakfast. [provider] Taking Active Self  loratadine (CLARITIN) 10 MG tablet [id number] No Take 10 mg by mouth daily. [provider] Taking Active Self  losartan-hydrochlorothiazide (HYZAAR) 100-25 MG tablet [id number] No Take 1 tablet by mouth daily. [provider] Taking Active Self  magnesium oxide (MAG-OX) 400 MG tablet [id number] No Take 400 mg by mouth daily.  [provider] Taking Active Self   methylPREDNISolone (MEDROL DOSEPAK) 4 MG TBPK tablet [id number] No Take by mouth as directed. [provider] Taking Active   Multiple Vitamin (MULTIVITAMIN WITH MINERALS) TABS tablet [id number] No Take 1 tablet by mouth daily. [provider] Taking Active Self  nitroGLYCERIN (NITROSTAT) 0.4 MG SL tablet [id number] No Place 0.4 mg under the tongue every 5 (five) minutes as needed for chest pain.  [provider] Taking Active Self  ondansetron (ZOFRAN) 4 MG tablet [id number] No Take 4 mg by mouth every 8 (eight) hours as needed for nausea or vomiting.  [provider] Taking Active Self  oxyCODONE-acetaminophen (PERCOCET) 10-325 MG tablet [id number] No Take 1 tablet by mouth 4 (four) times daily as needed for pain.  [provider] Taking Active Self           Med Note Jerline Pain, Dorian Furnace   Fri Oct 14, 2020  2:16 PM) Not present for counting.  potassium chloride SA (K-DUR) 20 MEQ tablet [id number] No Take 20 mEq by mouth daily.  [provider] Taking Active Self  SYMBICORT 160-4.5 MCG/ACT inhaler [id number] No Take 2 puffs by mouth 2 (two) times daily. [provider] Taking Active Self  TRULICITY 1.5 ME/2.6ST SOPN [id number] No Inject 1.5 mg into the skin every 7 (seven) days.  [provider] Taking Active Self           Med Note [facility], [name]   Wed Mar 16, 2020  2:10 PM)    vitamin B-12 (CYANOCOBALAMIN) 1000 MCG tablet [id number] No Take 1,000 mcg by mouth daily. [provider] Taking Active Self  Vitamin D, Ergocalciferol, (DRISDOL) 1.25 MG (50000 UNIT) CAPS capsule [id number] No Take 50,000 Units by mouth every 7 (seven) days.  [provider] Taking Active Self          Fall Risk  03/20/2021 12/08/2020 10/14/2020 09/23/2020 09/16/2020  Falls in the past year? 0 1 0 0 0  Comment - fell at home in Nov 2021 - - -  Number falls in past yr: 0 0 0 0 -  Injury with Fall? 0 0 0 - -  Risk for fall due to :  Medication side effect - - - -  Risk for fall due to: Comment - - - - -  Follow up Education provided;Falls evaluation completed - - - -    Depression screen [facility] 2/9 03/20/2021 12/08/2020 10/14/2020 09/23/2020 09/16/2020  Decreased Interest 0 0 0 0 0  Down, Depressed, Hopeless 0 0 0 0 0  PHQ - 2 Score 0 0 0 0 0  Altered sleeping - - - - -  Tired, decreased energy - - - - -  Change in appetite - - - - -  Feeling bad or failure about yourself  - - - - -  Trouble concentrating - - - - -  Moving slowly or fidgety/restless - - - - -  Suicidal thoughts - - - - -  PHQ-9 Score - - - - -  Difficult doing work/chores - - - - -  Some recent data might be hidden  SDOH Screenings   Alcohol Screen: Low Risk   . Last Alcohol Screening Score (AUDIT): 0  Depression (PHQ2-9): Low Risk   . PHQ-2 Score: 0  Financial Resource Strain: Unknown  . Difficulty of Paying Living Expenses: Patient refused  Food Insecurity: No Food Insecurity  . Worried About Charity fundraiser in the Last Year: Never true  . Ran Out of Food in the Last Year: Never true  Housing: Low Risk   . Last Housing Risk Score: 0  Physical Activity: Insufficiently Active  . Days of Exercise per Week: 1 day  . Minutes of Exercise per Session: 10 min  Social Connections: Unknown  . Frequency of Communication with Friends and Family: More than three times a week  . Frequency of Social Gatherings with Friends and Family: Patient refused  . Attends Religious Services: Patient refused  . Active Member of Clubs or Organizations: Yes  . Attends Archivist Meetings: Patient refused  . Marital Status: Patient refused  Stress: Unknown  . Feeling of Stress : Patient refused  Tobacco Use: Medium Risk  . Smoking Tobacco Use: Former Smoker  . Smokeless Tobacco Use: Never Used  Transportation Needs: No Transportation Needs  . Lack of Transportation (Medical): No  . Lack of Transportation (Non-Medical): No       Goals  Addressed              This Visit's Progress   .  (THN)Monitor and Manage My Blood Sugar-Diabetes Type 2 (pt-stated)        Timeframe:  Long-Range Goal Priority:  High Start Date: 03/20/2021                            Expected End Date:   June 2022                   Follow Up Date June 2022   - check blood sugar at prescribed times - check blood sugar if I feel it is too high or too low - take the blood sugar meter to all doctor visits    Why is this important?    Checking your blood sugar at home helps to keep it from getting very high or very low.   Writing the results in a diary or log helps the doctor know how to care for you.   Your blood sugar log should have the time, date and the results.   Also, write down the amount of insulin or other medicine that you take.   Other information, like what you ate, exercise done and how you were feeling, will also be helpful.     Notes:  03/20/21-Patient reports good glycemic control. She is monitoring cbgs in he home and adhering to diabetic diet.She reports med adherence.     .  (THN)Set My Target A1C-Diabetes Type 2 (pt-stated)        Timeframe:  Long-Range Goal Priority:  High Start Date: 03/20/2021                            Expected End Date: June 2022                      Follow Up Date June 2022   - set target A1C -maintain A1C level of 7.0 or less   Why is this important?    Your target A1C is  decided together by you and your doctor.   It is based on several things like your age and other health issues.    Notes:  03/20/21-Most recent A1C level 5.6(Nov 2021). Patient states due for labwork within the next 1-2 months.        Plan: RN CM discussed with patient next outreach within the month of June . Patient gave verbal consent and in agreement with RN CM follow up and timeframe. Patient aware that they may contact RN CM sooner for any issues or concerns. RN CM reviewed goals and plan of care with  patient. Patient agrees to care plan and follow up. RN CM will send quarterly update to PCP.  Enzo Montgomery, RN,BSN,CCM [facility] Telephonic Care Management Coordinator Direct Phone: [phone] Toll Free: [phone] Fax: [phone]

## 2021-03-29 ENCOUNTER — Encounter: Payer: Medicare HMO | Admitting: Physical Medicine & Rehabilitation

## 2021-04-19 DIAGNOSIS — L989 Disorder of the skin and subcutaneous tissue, unspecified: Secondary | ICD-10-CM | POA: Diagnosis not present

## 2021-04-19 DIAGNOSIS — Z6841 Body Mass Index (BMI) 40.0 and over, adult: Secondary | ICD-10-CM | POA: Diagnosis not present

## 2021-04-19 DIAGNOSIS — J449 Chronic obstructive pulmonary disease, unspecified: Secondary | ICD-10-CM | POA: Diagnosis not present

## 2021-04-19 DIAGNOSIS — Z0000 Encounter for general adult medical examination without abnormal findings: Secondary | ICD-10-CM | POA: Diagnosis not present

## 2021-04-19 DIAGNOSIS — G894 Chronic pain syndrome: Secondary | ICD-10-CM | POA: Diagnosis not present

## 2021-04-19 DIAGNOSIS — J302 Other seasonal allergic rhinitis: Secondary | ICD-10-CM | POA: Diagnosis not present

## 2021-04-19 DIAGNOSIS — Z1331 Encounter for screening for depression: Secondary | ICD-10-CM | POA: Diagnosis not present

## 2021-04-19 DIAGNOSIS — E1129 Type 2 diabetes mellitus with other diabetic kidney complication: Secondary | ICD-10-CM | POA: Diagnosis not present

## 2021-05-10 ENCOUNTER — Encounter: Payer: Medicare HMO | Admitting: Physical Medicine & Rehabilitation

## 2021-06-06 ENCOUNTER — Other Ambulatory Visit: Payer: Self-pay

## 2021-06-06 DIAGNOSIS — N200 Calculus of kidney: Secondary | ICD-10-CM

## 2021-06-06 NOTE — Addendum Note (Signed)
Addended by: Iris Pert on: 06/06/2021 01:26 PM   Modules accepted: Orders

## 2021-06-07 ENCOUNTER — Other Ambulatory Visit: Payer: Self-pay

## 2021-06-07 ENCOUNTER — Ambulatory Visit (HOSPITAL_COMMUNITY)
Admission: RE | Admit: 2021-06-07 | Discharge: 2021-06-07 | Disposition: A | Discharge status: Home / Self Care | Payer: Medicare HMO | Source: Ambulatory Visit | Attending: Urology | Admitting: Urology

## 2021-06-07 DIAGNOSIS — N200 Calculus of kidney: Secondary | ICD-10-CM | POA: Diagnosis not present

## 2021-06-07 DIAGNOSIS — R109 Unspecified abdominal pain: Secondary | ICD-10-CM | POA: Diagnosis not present

## 2021-06-08 ENCOUNTER — Encounter: Payer: Self-pay | Admitting: Urology

## 2021-06-08 ENCOUNTER — Ambulatory Visit (INDEPENDENT_AMBULATORY_CARE_PROVIDER_SITE_OTHER): Payer: Medicare HMO | Admitting: Urology

## 2021-06-08 VITALS — BP 131/73 | HR 73 | Ht 60.0 in

## 2021-06-08 DIAGNOSIS — N200 Calculus of kidney: Secondary | ICD-10-CM

## 2021-06-08 DIAGNOSIS — Z87440 Personal history of urinary (tract) infections: Secondary | ICD-10-CM | POA: Diagnosis not present

## 2021-06-08 DIAGNOSIS — N3941 Urge incontinence: Secondary | ICD-10-CM | POA: Diagnosis not present

## 2021-06-08 LAB — URINALYSIS, ROUTINE W REFLEX MICROSCOPIC
Bilirubin, UA: NEGATIVE
Glucose, UA: NEGATIVE
Ketones, UA: NEGATIVE
Leukocytes,UA: NEGATIVE
Nitrite, UA: NEGATIVE
RBC, UA: NEGATIVE
Specific Gravity, UA: 1.025 (ref 1.005–1.030)
Urobilinogen, Ur: 0.2 mg/dL (ref 0.2–1.0)
pH, UA: 5.5 (ref 5.0–7.5)

## 2021-06-08 LAB — MICROSCOPIC EXAMINATION
Epithelial Cells (non renal): >10 /hpf — AB (ref 0–10)
RBC, Urine: NONE SEEN /hpf (ref 0–2)
Renal Epithel, UA: NONE SEEN /hpf

## 2021-06-08 MED ORDER — OXYBUTYNIN CHLORIDE ER 10 MG PO TB24: 10.0000 mg | ORAL_TABLET | Freq: Every day | ORAL | 11 refills | Status: DC

## 2021-06-08 NOTE — Progress Notes (Signed)
Subjective:  1. Renal stones   2. Personal history of urinary infection   3. Urge incontinence      Jameshia returns today in f/u for her history of stones and UTI's. Her UA today is unremarkable.  She has had some increased incontinence with urgency.   She woke wet a few times recently.  She had been on oxybutynin in the past but not currently.  She has some right flank pain back pain.   She has malodorous urine today.  She has no recent UUI and has not been on the oxybutynin.   A KUB on 6/8 shows no new renal or ureteral stones.  The right lower pole is obscured by bowel and that was the location of a stone on the last film in Jul 12, 2023.  She has passed some stones over the last year.     GU Hx: She was treated with ureteroscopy for a right renal stone. She got septic after the last ureteroscopy and her stent came out the day after surgery. She had a 14mm RLP fragment that I couldn't reach with the ureteroscope so she was treated with ESWL on 2017/09/11. She has passed a few more fragments and her last CT prior to her last visit showed a reduced stone burden with small bilateral stones. the largest being 18mm in the RLP. Her stone was struvite, Ca Ox, Ca Phos and Ammonium acid urate. Her last culture in October grew Klebsiella and she was last treated with bactrim before going on the augmentin. She has had the GI bug prior to this visit.       ROS:  ROS:  A complete review of systems was performed.  All systems are negative except for pertinent findings as noted.   ROS  Allergies  Allergen Reactions   Ancef [Cefazolin] Anaphylaxis and Shortness Of Breath        Bee Venom Anaphylaxis    As a child   Meloxicam     Gi upset    Ceftin [Cefuroxime Axetil] Swelling    Swollen tongue   Chicken Meat (Diagnostic) Nausea And Vomiting   Ciprofloxacin Cough    Coughed up blood   Macrodantin [Nitrofurantoin] Other (See Comments)    Vomited blood and had dark stool.   Meat [Alpha-Gal] Nausea And  Vomiting   Nsaids Other (See Comments)    Rectal bleeding    Other Other (See Comments)    Snake venom - Scaly Scalp, SOB, Night Terrors   Latex Itching and Rash   Milk-Related Compounds Other (See Comments)    IBS    Outpatient Encounter Medications as of 06/08/2021  Medication Sig Note   albuterol (PROVENTIL HFA;VENTOLIN HFA) 108 (90 BASE) MCG/ACT inhaler Inhale 2 puffs into the lungs every 6 (six) hours as needed for wheezing or shortness of breath.     albuterol (PROVENTIL) (2.5 MG/3ML) 0.083% nebulizer solution Take 2.5 mg by nebulization every 4 (four) hours as needed for wheezing or shortness of breath.     ALPRAZolam (XANAX) 0.5 MG tablet Take 0.5 mg by mouth daily as needed for anxiety.     amLODipine (NORVASC) 10 MG tablet Take 10 mg by mouth every evening.     Ascorbic Acid (VITAMIN C) 500 MG CAPS Take 500 mg by mouth daily.     baclofen (LIORESAL) 10 MG tablet Take 1 tablet (10 mg total) by mouth 3 (three) times daily as needed for muscle spasms.    cholecalciferol (VITAMIN D3) 25 MCG (1000 UNIT) tablet  Take 1,000 Units by mouth daily.    clobetasol (TEMOVATE) 0.05 % external solution Apply 1 application topically daily as needed (scalp irritation).     EPINEPHrine 0.3 mg/0.3 mL IJ SOAJ injection Inject 0.3 mg into the muscle as needed for anaphylaxis.     fluticasone (FLONASE) 50 MCG/ACT nasal spray Place 1 spray into both nostrils daily.     furosemide (LASIX) 40 MG tablet Take 40 mg by mouth daily as needed.    gabapentin (NEURONTIN) 600 MG tablet Take 600 mg by mouth 3 (three) times daily.     isosorbide mononitrate (IMDUR) 30 MG 24 hr tablet Take 30 mg by mouth daily.     linaclotide (LINZESS) 72 MCG capsule Take 72 mcg by mouth daily before breakfast.    loratadine (CLARITIN) 10 MG tablet Take 10 mg by mouth daily.    losartan-hydrochlorothiazide (HYZAAR) 100-25 MG tablet Take 1 tablet by mouth daily.    magnesium oxide (MAG-OX) 400 MG tablet Take 400 mg by mouth daily.      Multiple Vitamin (MULTIVITAMIN WITH MINERALS) TABS tablet Take 1 tablet by mouth daily.    nitroGLYCERIN (NITROSTAT) 0.4 MG SL tablet Place 0.4 mg under the tongue every 5 (five) minutes as needed for chest pain.     ondansetron (ZOFRAN) 4 MG tablet Take 4 mg by mouth every 8 (eight) hours as needed for nausea or vomiting.     oxybutynin (DITROPAN-XL) 10 MG 24 hr tablet Take 1 tablet (10 mg total) by mouth daily.    oxyCODONE-acetaminophen (PERCOCET) 10-325 MG tablet Take 1 tablet by mouth 4 (four) times daily as needed for pain.  10/14/2020: Not present for counting.   potassium chloride SA (K-DUR) 20 MEQ tablet Take 20 mEq by mouth daily.     SYMBICORT 160-4.5 MCG/ACT inhaler Take 2 puffs by mouth 2 (two) times daily.    TRULICITY 1.5 HK/7.4QV SOPN Inject 1.5 mg into the skin every 7 (seven) days.     vitamin B-12 (CYANOCOBALAMIN) 1000 MCG tablet Take 1,000 mcg by mouth daily.    Vitamin D, Ergocalciferol, (DRISDOL) 1.25 MG (50000 UNIT) CAPS capsule Take 50,000 Units by mouth every 7 (seven) days.     [DISCONTINUED] DERMA-SMOOTHE/FS SCALP 0.01 % OIL Apply 1 application topically at bedtime.  (Patient not taking: Reported on 06/08/2021)    [DISCONTINUED] desonide (DESOWEN) 0.05 % cream Apply 1 application topically 2 (two) times daily as needed (irritation).  (Patient not taking: Reported on 06/08/2021)    [DISCONTINUED] methylPREDNISolone (MEDROL DOSEPAK) 4 MG TBPK tablet Take by mouth as directed. (Patient not taking: Reported on 06/08/2021)    No facility-administered encounter medications on file as of 06/08/2021.    Past Medical History:  Diagnosis Date   Adnexal cyst    Right simple cyst seen on [place]   Anginal pain (HCC)    Anxiety    Arthritis    Asthma    Atrial fibrillation (HCC)    hx of    CHF (congestive heart failure) (HCC)    Chronic back pain    Chronic hip pain    COPD (chronic obstructive pulmonary disease) (HCC)    Coronary artery disease    stents placed    Dyspnea     GERD (gastroesophageal reflux disease)    Headache    due to pinched nerve damaage    Heart murmur    hx of slight murmur    History of bronchitis    History of cardiac catheterization  Normal coronaries 2013   History of kidney stones    Hot flashes    Hypertension    Hypertriglyceridemia    IBS (irritable bowel syndrome)    Internal hemorrhoids    Morbid obesity ([place])    s/p lap band surgery   Myocardial infarction ([place]) 12/2018   times 3   Obesity    Pulmonary HTN ([place])    [place] 2008:  RA pressures 13/10 with a mean of 7 mmHg.  RV pressure 01/75 with end diastolic pressure of 15 mmHg.  PA pressure 49/23 with a mean of 35 mmHg.  Pulmonary capillary wedge 17/50 with a mean of 14 mmHg. The PA saturation was 68%.  RA saturation was 71% and aortic saturation was 90%.  Cardiac output was 6.0 with a cardiac index of 2.80 by Fick.  Normal coronaries by cath 2008.   Renal insufficiency    Sciatica of right side    Sleep apnea    CPAP use does not know settings    Trauma    Type 2 diabetes mellitus ([place])    type II    Uterine fibroid     Past Surgical History:  Procedure Laterality Date   accupuncture     APPENDECTOMY     CARDIAC CATHETERIZATION  07/2007, 05/2012   Normal coronary arteries   COLONOSCOPY  10/18/2010   SLF:2-mm sessile sigmoid polyp/diverticula, tortuous colon.  Biopsies of sigmoid colon polyp and random colon biopsies benign   COLONOSCOPY WITH PROPOFOL N/A 08/09/2020   Procedure: COLONOSCOPY WITH PROPOFOL;  Surgeon: Eloise Harman, DO;  Location: [facility];  Service: Endoscopy;  Laterality: N/A;  11:00am   CYSTOSCOPY/URETEROSCOPY/HOLMIUM LASER/STENT PLACEMENT Right 08/01/2017   Procedure: CYSTOSCOPY/ RIGHT URETEROSCOPY/ RIGHT RETROGRADE/ HOLMIUM LASER  AND RIGHT STENT PLACEMENT FIRST STAGE;  Surgeon: Irine Seal, MD;  Location: [facility];  Service: Urology;  Laterality: Right;   CYSTOSCOPY/URETEROSCOPY/HOLMIUM LASER/STENT PLACEMENT Right 08/08/2017   Procedure: RIGHT  URETEROSCOPY WITH HOLMIUM LASER RIGHT STENT PLACEMENT;  Surgeon: Irine Seal, MD;  Location: [facility];  Service: Urology;  Laterality: Right;   EXTRACORPOREAL SHOCK WAVE LITHOTRIPSY Right 08/22/2017   Procedure: RIGHT EXTRACORPOREAL SHOCK WAVE LITHOTRIPSY (ESWL);  Surgeon: Alexis Frock, MD;  Location: [facility];  Service: Urology;  Laterality: Right;   HEMORRHOID BANDING  07/2012   Dr. Oneida Alar   LAPAROSCOPIC CHOLECYSTECTOMY  1985   LAPAROSCOPIC GASTRIC BANDING  12/12/2010   LEFT AND RIGHT HEART CATHETERIZATION WITH CORONARY ANGIOGRAM N/A 06/03/2012   Procedure: LEFT AND RIGHT HEART CATHETERIZATION WITH CORONARY ANGIOGRAM;  Surgeon: Jolaine Artist, MD;  Location: [facility] CATH LAB;  Service: Cardiovascular;  Laterality: N/A;   PILONIDAL CYST EXCISION  1974   RIGHT/LEFT HEART CATH AND CORONARY ANGIOGRAPHY N/A 03/28/2020   Procedure: RIGHT/LEFT HEART CATH AND CORONARY ANGIOGRAPHY;  Surgeon: Jolaine Artist, MD;  Location: [facility] CV LAB;  Service: Cardiovascular;  Laterality: N/A;   TUMOR EXCISION  10/2011   Left thigh   TUMOR EXCISION  10/2011   Face    Social History   Socioeconomic History   Marital status: Single    Spouse name: Not on file   Number of children: 0   Years of education: Not on file   Highest education level: Not on file  Occupational History   Occupation: Volunteers   Occupation: Midwife    Comment: Retired from [employer] home in [place] Use   Smoking status: Former    Pack years: 0.00    Types: Cigarettes  Quit date: 08/08/1996    Years since quitting: 24.8   Smokeless tobacco: Never  Vaping Use   Vaping Use: Never used  Substance and Sexual Activity   Alcohol use: No   Drug use: No   Sexual activity: Yes    Birth control/protection: Post-menopausal  Other Topics Concern   Not on file  Social History Narrative   Not on file   Social Determinants of Health   Financial Resource Strain: Not on file  Food Insecurity: No Food Insecurity    Worried About Running Out of Food in the Last Year: Never true   Ran Out of Food in the Last Year: Never true  Transportation Needs: No Transportation Needs   Lack of Transportation (Medical): No   Lack of Transportation (Non-Medical): No  Physical Activity: Not on file  Stress: Not on file  Social Connections: Not on file  Intimate Partner Violence: Not on file    Family History  Problem Relation Age of Onset   Diabetes Mother    Heart failure Mother    Breast cancer Sister    CAD Father    Hypertension Father    Heart failure Father    Cancer Paternal Grandfather    Hypertension Paternal Grandmother    Stroke Paternal Grandmother    Other Maternal Grandmother        tonsilitis   Diabetes Maternal Grandfather    Hypertension Maternal Grandfather    Breast cancer Paternal Aunt        Objective: Vitals:   06/08/21 1044  BP: 131/73  Pulse: 73     Physical Exam Vitals reviewed.  Constitutional:      Appearance: Normal appearance. She is obese.  Neurological:     Mental Status: She is alert.    Lab Results:  Results for orders placed or performed in visit on 06/08/21 (from the past 24 hour(s))  Urinalysis, Routine w reflex microscopic     Status: Abnormal   Collection Time: 06/08/21 10:43 AM  Result Value Ref Range   Specific Gravity, UA 1.025 1.005 - 1.030   pH, UA 5.5 5.0 - 7.5   Color, UA Yellow Yellow   Appearance Ur Clear Clear   Leukocytes,UA Negative Negative   Protein,UA 1+ (A) Negative/Trace   Glucose, UA Negative Negative   Ketones, UA Negative Negative   RBC, UA Negative Negative   Bilirubin, UA Negative Negative   Urobilinogen, Ur 0.2 0.2 - 1.0 mg/dL   Nitrite, UA Negative Negative   Microscopic Examination See below:    Narrative   Performed at:  [facility] [address] [place], [place]  [id number] Lab Director: Mina Marble MT, Phone:  [phone]  Microscopic Examination     Status: Abnormal   Collection Time:  06/08/21 10:43 AM   Urine  Result Value Ref Range   WBC, UA 0-5 0 - 5 /hpf   RBC None seen 0 - 2 /hpf   Epithelial Cells (non renal) >10 (A) 0 - 10 /hpf   Renal Epithel, UA None seen None seen /hpf   Mucus, UA Present Not Estab.   Bacteria, UA Moderate (A) None seen/Few   Narrative   Performed at:  [facility] [address] [place], [place]  [id number] Lab Director: [place], Phone:  [phone]    BMET No results for input(s): NA, K, CL, CO2, GLUCOSE, BUN, CREATININE, CALCIUM in the last 72 hours. PSA No results found for: PSA No results found  for: TESTOSTERONE    Studies/Results: KUB from 6/8 reviewed with results noted above.      Assessment & Plan: Right renal stone.  The stone is not clearly seen today and there are no new stones visible.  KUB in a year.   History of UTI's.  UA is clear today.   UUI.  This problem has recurred.  I will have her resume oxybutynin ER 10mg .   Meds ordered this encounter  Medications   oxybutynin (DITROPAN-XL) 10 MG 24 hr tablet    Sig: Take 1 tablet (10 mg total) by mouth daily.    Dispense:  30 tablet    Refill:  11     Orders Placed This Encounter  Procedures   Microscopic Examination   DG Abd 1 View    Standing Status:   Future    Standing Expiration Date:   06/08/2022    Order Specific Question:   Reason for Exam (SYMPTOM  OR DIAGNOSIS REQUIRED)    Answer:   renal stones    Order Specific Question:   Preferred imaging location?    Answer:   [facility]    Order Specific Question:   Radiology Contrast Protocol - do NOT remove file path    Answer:   [email or web address]   Urinalysis, Routine w reflex microscopic      Return in about 1 year (around 06/08/2022) for with KUB.   CC: Sharilyn Sites, MD      Irine Seal 06/08/2021

## 2021-06-08 NOTE — Progress Notes (Signed)
Urological Symptom Review  Patient is experiencing the following symptoms: Hard to postpone urination Get up at night to urinate Leakage of urine Painful intercourse Kidney Stones  Review of Systems  Gastrointestinal (upper)  : Negative for upper GI symptoms  Gastrointestinal (lower) : Constipation  Constitutional : Weight loss Fatigue  Skin: Itching  Eyes: Negative for eye symptoms  Ear/Nose/Throat : Sinus problems  Hematologic/Lymphatic: Negative for Hematologic/Lymphatic symptoms  Cardiovascular : Leg swelling Chest pain  Respiratory : Shortness of breath  Endocrine: Negative for endocrine symptoms  Musculoskeletal: Back pain Joint pain  Neurological: Headaches Dizziness  Psychologic: Negative for psychiatric symptoms

## 2021-06-09 ENCOUNTER — Ambulatory Visit: Payer: Medicare HMO | Admitting: Urology

## 2021-06-12 NOTE — Progress Notes (Signed)
Sent via mychart

## 2021-06-14 ENCOUNTER — Other Ambulatory Visit: Payer: Self-pay

## 2021-06-14 ENCOUNTER — Encounter: Payer: Medicare HMO | Attending: Physical Medicine & Rehabilitation | Admitting: Physical Medicine & Rehabilitation

## 2021-06-14 ENCOUNTER — Encounter: Payer: Self-pay | Admitting: Physical Medicine & Rehabilitation

## 2021-06-14 VITALS — BP 125/76 | HR 71 | Temp 98.4°F | Ht 60.0 in | Wt 249.0 lb

## 2021-06-14 DIAGNOSIS — M5416 Radiculopathy, lumbar region: Secondary | ICD-10-CM | POA: Diagnosis not present

## 2021-06-14 DIAGNOSIS — M7060 Trochanteric bursitis, unspecified hip: Secondary | ICD-10-CM | POA: Insufficient documentation

## 2021-06-14 DIAGNOSIS — G5601 Carpal tunnel syndrome, right upper limb: Secondary | ICD-10-CM

## 2021-06-14 DIAGNOSIS — M47816 Spondylosis without myelopathy or radiculopathy, lumbar region: Secondary | ICD-10-CM | POA: Insufficient documentation

## 2021-06-14 DIAGNOSIS — R6889 Other general symptoms and signs: Secondary | ICD-10-CM | POA: Diagnosis not present

## 2021-06-14 MED ORDER — OXYCODONE-ACETAMINOPHEN 10-325 MG PO TABS: 1.0000 | ORAL_TABLET | Freq: Four times a day (QID) | ORAL | 0 refills | Status: DC | PRN

## 2021-06-14 NOTE — Patient Instructions (Addendum)
NIGHT TIME WRIST SPLINT WEAR TO START FOR 2 WEEKS. IF NO IMPROVEMENT, YOU NEED TO WEAR RIGHT WRIST SPLINT DURING THE DAY ALSO.   IF YOUR WRIST AND FINGERS CONTINUE TO BOTHER YOU, WE CAN CONSIDER A WRIST INJECTION

## 2021-06-14 NOTE — Progress Notes (Signed)
Subjective:    Patient ID: Sara Tran, female    DOB: 1956-01-28, 65 y.o.   MRN: [id number]  HPI  Sara Tran is here in follow-up of her chronic pain syndrome.  She has been seeing Dr. Letta Pate over this past year for multiple acupuncture sessions. She received 10 treatments focusing on her right arm. She felt that these helped. She has a wrist splint but it's worn and she hasn't been using consistently. She uses OTC creams and patches as well for her pain.  She still deals with some chronic low back pain as well.  Some of her pain is centered around the care that she has to provide for her father who is now on hospice.  She had to do some physical things to assist with his hygiene, toileting, etc.  Her primary prescribes her oxycodone which she takes 4 times daily.  She does not have an appointment with him for 2 weeks and tells me that she is almost out medication today.  Pain Inventory Average Pain 8 Pain Right Now 8 My pain is constant, sharp, burning, stabbing, tingling, and aching  In the last 24 hours, has pain interfered with the following? General activity 7 Relation with others 8 Enjoyment of life 9 What TIME of day is your pain at its worst? morning  and night Sleep (in general) Poor  Pain is worse with: walking, bending, standing, and some activites Pain improves with: rest, therapy/exercise, medication, and injections Relief from Meds: 9  Family History  Problem Relation Age of Onset   Diabetes Mother    Heart failure Mother    Breast cancer Sister    CAD Father    Hypertension Father    Heart failure Father    Cancer Paternal Grandfather    Hypertension Paternal Grandmother    Stroke Paternal Grandmother    Other Maternal Grandmother        tonsilitis   Diabetes Maternal Grandfather    Hypertension Maternal Grandfather    Breast cancer Paternal Aunt    Social History   Socioeconomic History   Marital status: Single    Spouse name: Not on file   Number of  children: 0   Years of education: Not on file   Highest education level: Not on file  Occupational History   Occupation: Volunteers   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 [place]  Tobacco Use   Smoking status: Former    Pack years: 0.00    Types: Cigarettes    Quit date: 08/08/1996    Years since quitting: 24.8   Smokeless tobacco: Never  Vaping Use   Vaping Use: Never used  Substance and Sexual Activity   Alcohol use: No   Drug use: No   Sexual activity: Yes    Birth control/protection: Post-menopausal  Other Topics Concern   Not on file  Social History Narrative   Not on file   Social Determinants of Health   Financial Resource Strain: Not on file  Food Insecurity: No Food Insecurity   Worried About Running Out of Food in the Last Year: Never true   [place] in the Last Year: Never true  Transportation Needs: No Transportation Needs   Lack of Transportation (Medical): No   Lack of Transportation (Non-Medical): No  Physical Activity: Not on file  Stress: Not on file  Social Connections: Not on file   Past Surgical History:  Procedure Laterality Date   accupuncture  APPENDECTOMY     CARDIAC CATHETERIZATION  07/2007, 05/2012   Normal coronary arteries   COLONOSCOPY  10/18/2010   SLF:2-mm sessile sigmoid polyp/diverticula, tortuous colon.  Biopsies of sigmoid colon polyp and random colon biopsies benign   COLONOSCOPY WITH PROPOFOL N/A 08/09/2020   Procedure: COLONOSCOPY WITH PROPOFOL;  Surgeon: Eloise Harman, DO;  [facility];  Service: Endoscopy;  Laterality: N/A;  11:00am   CYSTOSCOPY/URETEROSCOPY/HOLMIUM LASER/STENT PLACEMENT Right 08/01/2017   Procedure: CYSTOSCOPY/ RIGHT URETEROSCOPY/ RIGHT RETROGRADE/ HOLMIUM LASER  AND RIGHT STENT PLACEMENT FIRST STAGE;  Surgeon: Irine Seal, MD;  Location: [facility];  Service: Urology;  Laterality: Right;   CYSTOSCOPY/URETEROSCOPY/HOLMIUM LASER/STENT PLACEMENT Right 08/08/2017    Procedure: RIGHT URETEROSCOPY WITH HOLMIUM LASER RIGHT STENT PLACEMENT;  Surgeon: Irine Seal, MD;  [facility]: [facility];  Service: Urology;  Laterality: Right;   EXTRACORPOREAL SHOCK WAVE LITHOTRIPSY Right 08/22/2017   Procedure: RIGHT EXTRACORPOREAL SHOCK WAVE LITHOTRIPSY (ESWL);  Surgeon: Alexis Frock, MD;  [facility]: [facility];  Service: Urology;  Laterality: Right;   HEMORRHOID BANDING  07/2012   Dr. Oneida Alar   LAPAROSCOPIC CHOLECYSTECTOMY  1985   LAPAROSCOPIC GASTRIC BANDING  12/12/2010   LEFT AND RIGHT HEART CATHETERIZATION WITH CORONARY ANGIOGRAM N/A 06/03/2012   Procedure: LEFT AND RIGHT HEART CATHETERIZATION WITH CORONARY ANGIOGRAM;  Surgeon: Jolaine Artist, MD;  Location: [facility] CATH LAB;  Service: Cardiovascular;  Laterality: N/A;   PILONIDAL CYST EXCISION  1974   RIGHT/LEFT HEART CATH AND CORONARY ANGIOGRAPHY N/A 03/28/2020   Procedure: RIGHT/LEFT HEART CATH AND CORONARY ANGIOGRAPHY;  Surgeon: Jolaine Artist, MD;  Location: [place] CV LAB;  Service: Cardiovascular;  Laterality: N/A;   TUMOR EXCISION  10/2011   Left thigh   TUMOR EXCISION  10/2011   Face   Past Surgical History:  Procedure Laterality Date   accupuncture     APPENDECTOMY     CARDIAC CATHETERIZATION  07/2007, 05/2012   Normal coronary arteries   COLONOSCOPY  10/18/2010   SLF:2-mm sessile sigmoid polyp/diverticula, tortuous colon.  Biopsies of sigmoid colon polyp and random colon biopsies benign   COLONOSCOPY WITH PROPOFOL N/A 08/09/2020   Procedure: COLONOSCOPY WITH PROPOFOL;  Surgeon: Eloise Harman, DO;  [facility];  Service: Endoscopy;  Laterality: N/A;  11:00am   CYSTOSCOPY/URETEROSCOPY/HOLMIUM LASER/STENT PLACEMENT Right 08/01/2017   Procedure: CYSTOSCOPY/ RIGHT URETEROSCOPY/ RIGHT RETROGRADE/ HOLMIUM LASER  AND RIGHT STENT PLACEMENT FIRST STAGE;  Surgeon: Irine Seal, MD;  [facility];  Service: Urology;  Laterality: Right;   CYSTOSCOPY/URETEROSCOPY/HOLMIUM LASER/STENT PLACEMENT Right  08/08/2017   Procedure: RIGHT URETEROSCOPY WITH HOLMIUM LASER RIGHT STENT PLACEMENT;  Surgeon: Irine Seal, MD;  [facility];  Service: Urology;  Laterality: Right;   EXTRACORPOREAL SHOCK WAVE LITHOTRIPSY Right 08/22/2017   Procedure: RIGHT EXTRACORPOREAL SHOCK WAVE LITHOTRIPSY (ESWL);  Surgeon: Alexis Frock, MD;  [facility];  Service: Urology;  Laterality: Right;   HEMORRHOID BANDING  07/2012   Dr. Oneida Alar   LAPAROSCOPIC CHOLECYSTECTOMY  1985   LAPAROSCOPIC GASTRIC BANDING  12/12/2010   LEFT AND RIGHT HEART CATHETERIZATION WITH CORONARY ANGIOGRAM N/A 06/03/2012   Procedure: LEFT AND RIGHT HEART CATHETERIZATION WITH CORONARY ANGIOGRAM;  Surgeon: Jolaine Artist, MD;  [facility] CATH LAB;  Service: Cardiovascular;  Laterality: N/A;   PILONIDAL CYST EXCISION  1974   RIGHT/LEFT HEART CATH AND CORONARY ANGIOGRAPHY N/A 03/28/2020   Procedure: RIGHT/LEFT HEART CATH AND CORONARY ANGIOGRAPHY;  Surgeon: Jolaine Artist, MD;  Location: [place] CV LAB;  Service: Cardiovascular;  Laterality:  N/A;   TUMOR EXCISION  10/2011   Left thigh   TUMOR EXCISION  10/2011   Face   Past Medical History:  Diagnosis Date   Adnexal cyst    Right simple cyst seen on [place]   Anginal pain (HCC)    Anxiety    Arthritis    Asthma    Atrial fibrillation (HCC)    hx of    CHF (congestive heart failure) (HCC)    Chronic back pain    Chronic hip pain    COPD (chronic obstructive pulmonary disease) ([place])    Coronary artery disease    stents placed    Dyspnea    GERD (gastroesophageal reflux disease)    Headache    due to pinched nerve damaage    Heart murmur    hx of slight murmur    History of bronchitis    History of cardiac catheterization    Normal coronaries 2013   History of kidney stones    Hot flashes    Hypertension    Hypertriglyceridemia    IBS (irritable bowel syndrome)    Internal hemorrhoids    Morbid obesity ([place])    s/p lap band surgery   Myocardial infarction ([place])  12/2018   times 3   Obesity    Pulmonary HTN ([place])    [place] 2008:  RA pressures 13/10 with a mean of 7 mmHg.  RV pressure 54/27 with end diastolic pressure of 15 mmHg.  PA pressure 49/23 with a mean of 35 mmHg.  Pulmonary capillary wedge 17/50 with a mean of 14 mmHg. The PA saturation was 68%.  RA saturation was 71% and aortic saturation was 90%.  Cardiac output was 6.0 with a cardiac index of 2.80 by Fick.  Normal coronaries by cath 2008.   Renal insufficiency    Sciatica of right side    Sleep apnea    CPAP use does not know settings    Trauma    Type 2 diabetes mellitus (HCC)    type II    Uterine fibroid    BP 125/76 (BP Location: Right Arm, Patient Position: Sitting, Cuff Size: Large)   Pulse 71   Temp 98.4 F (36.9 C)   Ht 5' (1.524 m)   Wt 249 lb (112.9 kg)   LMP 06/06/2013   SpO2 96%   BMI 48.63 kg/m   Opioid Risk Score:   Fall Risk Score:  `1  Depression screen PHQ 2/9  Depression screen [facility] 2/9 03/20/2021 12/08/2020 10/14/2020 09/23/2020 09/16/2020 09/09/2020 09/08/2020  Decreased Interest 0 0 0 0 0 0 0  Down, Depressed, Hopeless 0 0 0 0 0 0 0  PHQ - 2 Score 0 0 0 0 0 0 0  Altered sleeping - - - - - - -  Tired, decreased energy - - - - - - -  Change in appetite - - - - - - -  Feeling bad or failure about yourself  - - - - - - -  Trouble concentrating - - - - - - -  Moving slowly or fidgety/restless - - - - - - -  Suicidal thoughts - - - - - - -  PHQ-9 Score - - - - - - -  Difficult doing work/chores - - - - - - -  Some recent data might be hidden     Review of Systems  Musculoskeletal:        Joint pain  All other systems reviewed and  are negative.     Objective:   Physical Exam General: No acute distress HEENT: EOMI, oral membranes moist Cards: reg rate  Chest: normal effort Abdomen: Soft, NT, ND Skin: dry, intact Extremities: no edema Psych: pleasant and appropriate  Neuro: Pt is cognitively appropriate with normal insight, memory, and awareness.  Cranial nerves 2-12 are intact. Sensory exam is normal. Reflexes are 2+ in all 4's. Fine motor coordination is intact. No tremors. Motor function is grossly 5/5. Musculoskeletal: Mild low back tenderness with extension and flexion.  Positive Tinel's sign at the right wrist.  Reasonable grip strength no muscle atrophy.  No obvious sensory loss in the hand noted today.  She does have some tenderness along the dorsal aspect of the wrist and forearm.  No joint swelling appreciated.               Assessment & Plan:  1.  Chronic low back pain with known facet arthropathy and L4-L5 anterolisthesis.  Has had some intermittent radicular signs as well.  Known dextroscoliosis and maladaptive pelvic positioning also. 2.  Bilateral greater trochanteric bursitis with iliotibial band syndrome right greater than left 3.  Morbid obesity.   4.  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  Cervicalgia 6.  History of kidney stones 7. Right wrist pain, moderate CTS by [facility] 02/02/20 (Dr. Ernestina Patches) 8. ?FMS   Plan: 1.  Continue Gabapentin 600mg  TID 2.  again reasonable activity was discussed.  3. oxycodone per primary. Will give her a bridge rx to make her appt with primary. 4. Baclofen 10mg  tid prn may continue 5. continue with bowel regimen 6. Will refer back to Dr. Letta Pate for further acupuncture which she found helpful for RUE pain. 7. Advised night time and pontentially day time splint wear. If pain increases, can consider wrist injection.    15 minutes of face to face patient care time were spent during this visit. All questions were encouraged and answered.  Follow up with me in 6 mos, Dr. Raliegh Ip sooner.

## 2021-06-16 ENCOUNTER — Other Ambulatory Visit: Payer: Self-pay

## 2021-06-16 NOTE — Patient Outreach (Signed)
[facility]) Care Management  06/16/2021  DEREK HUNEYCUTT [birth year] [id number]   Telephone Assessment    Unsuccessful outreach attempt to patient. No answer after multiple rings and unable to leave message.    Plan: RN CM will make outreach attempt to patient within the month of Aug if no return call.   Enzo Montgomery, RN,BSN,CCM [facility] Telephonic Care Management Coordinator Direct Phone: [phone] Toll Free: [phone] Fax: [phone]

## 2021-06-21 DIAGNOSIS — E1129 Type 2 diabetes mellitus with other diabetic kidney complication: Secondary | ICD-10-CM | POA: Diagnosis not present

## 2021-06-21 DIAGNOSIS — I10 Essential (primary) hypertension: Secondary | ICD-10-CM | POA: Diagnosis not present

## 2021-06-21 DIAGNOSIS — E1122 Type 2 diabetes mellitus with diabetic chronic kidney disease: Secondary | ICD-10-CM | POA: Diagnosis not present

## 2021-06-21 DIAGNOSIS — L989 Disorder of the skin and subcutaneous tissue, unspecified: Secondary | ICD-10-CM | POA: Diagnosis not present

## 2021-06-21 DIAGNOSIS — N183 Chronic kidney disease, stage 3 unspecified: Secondary | ICD-10-CM | POA: Diagnosis not present

## 2021-06-21 DIAGNOSIS — Z9884 Bariatric surgery status: Secondary | ICD-10-CM | POA: Diagnosis not present

## 2021-06-21 DIAGNOSIS — E559 Vitamin D deficiency, unspecified: Secondary | ICD-10-CM | POA: Diagnosis not present

## 2021-06-21 DIAGNOSIS — Z6841 Body Mass Index (BMI) 40.0 and over, adult: Secondary | ICD-10-CM | POA: Diagnosis not present

## 2021-06-21 DIAGNOSIS — G894 Chronic pain syndrome: Secondary | ICD-10-CM | POA: Diagnosis not present

## 2021-06-21 DIAGNOSIS — J449 Chronic obstructive pulmonary disease, unspecified: Secondary | ICD-10-CM | POA: Diagnosis not present

## 2021-07-14 ENCOUNTER — Ambulatory Visit (HOSPITAL_COMMUNITY)
Admission: RE | Admit: 2021-07-14 | Discharge: 2021-07-14 | Disposition: A | Discharge status: Home / Self Care | Payer: Medicare HMO | Source: Ambulatory Visit | Attending: Internal Medicine | Admitting: Internal Medicine

## 2021-07-14 ENCOUNTER — Other Ambulatory Visit: Payer: Self-pay

## 2021-07-14 VITALS — BP 154/60 | HR 72 | Ht 60.0 in | Wt 243.8 lb

## 2021-07-14 DIAGNOSIS — I252 Old myocardial infarction: Secondary | ICD-10-CM | POA: Diagnosis not present

## 2021-07-14 DIAGNOSIS — Z7951 Long term (current) use of inhaled steroids: Secondary | ICD-10-CM | POA: Diagnosis not present

## 2021-07-14 DIAGNOSIS — I110 Hypertensive heart disease with heart failure: Secondary | ICD-10-CM | POA: Diagnosis not present

## 2021-07-14 DIAGNOSIS — I2510 Atherosclerotic heart disease of native coronary artery without angina pectoris: Secondary | ICD-10-CM | POA: Insufficient documentation

## 2021-07-14 DIAGNOSIS — I272 Pulmonary hypertension, unspecified: Secondary | ICD-10-CM | POA: Diagnosis not present

## 2021-07-14 DIAGNOSIS — I2721 Secondary pulmonary arterial hypertension: Secondary | ICD-10-CM

## 2021-07-14 DIAGNOSIS — I10 Essential (primary) hypertension: Secondary | ICD-10-CM | POA: Diagnosis not present

## 2021-07-14 DIAGNOSIS — R6889 Other general symptoms and signs: Secondary | ICD-10-CM | POA: Diagnosis not present

## 2021-07-14 DIAGNOSIS — E119 Type 2 diabetes mellitus without complications: Secondary | ICD-10-CM | POA: Diagnosis not present

## 2021-07-14 DIAGNOSIS — J449 Chronic obstructive pulmonary disease, unspecified: Secondary | ICD-10-CM | POA: Insufficient documentation

## 2021-07-14 DIAGNOSIS — R079 Chest pain, unspecified: Secondary | ICD-10-CM | POA: Insufficient documentation

## 2021-07-14 DIAGNOSIS — I4891 Unspecified atrial fibrillation: Secondary | ICD-10-CM | POA: Diagnosis not present

## 2021-07-14 DIAGNOSIS — E781 Pure hyperglyceridemia: Secondary | ICD-10-CM | POA: Insufficient documentation

## 2021-07-14 DIAGNOSIS — Z9884 Bariatric surgery status: Secondary | ICD-10-CM | POA: Insufficient documentation

## 2021-07-14 DIAGNOSIS — I509 Heart failure, unspecified: Secondary | ICD-10-CM | POA: Insufficient documentation

## 2021-07-14 DIAGNOSIS — Z79899 Other long term (current) drug therapy: Secondary | ICD-10-CM | POA: Insufficient documentation

## 2021-07-14 DIAGNOSIS — Z6841 Body Mass Index (BMI) 40.0 and over, adult: Secondary | ICD-10-CM | POA: Insufficient documentation

## 2021-07-14 DIAGNOSIS — G4733 Obstructive sleep apnea (adult) (pediatric): Secondary | ICD-10-CM | POA: Diagnosis not present

## 2021-07-14 DIAGNOSIS — Z8249 Family history of ischemic heart disease and other diseases of the circulatory system: Secondary | ICD-10-CM | POA: Diagnosis not present

## 2021-07-14 DIAGNOSIS — R0600 Dyspnea, unspecified: Secondary | ICD-10-CM | POA: Insufficient documentation

## 2021-07-14 DIAGNOSIS — R0789 Other chest pain: Secondary | ICD-10-CM

## 2021-07-14 NOTE — Addendum Note (Signed)
Encounter addended by: Enid Derry, CMA on: 07/14/2021 4:02 PM  Actions taken: Vitals modified, Home Medications modified, Medication taking status modified, Order Reconciliation Section accessed

## 2021-07-14 NOTE — Progress Notes (Signed)
ADVANCED HF CLINIC NOTE   Patient ID: Sara Tran, female   DOB: Jan 05, 1956, 65 y.o.   MRN: [id number] Primary Care: Dr. Hilma Favors Primary Cardiologist: Dr. Haroldine Laws  Nephrologist: Dr. Hinda Lenis  HPI:  Sara Tran is a 65 y.o. African American female with history of mild pulmonary hypertension, diabetes, HF, COPD, sleep apnea with CPAP, elevated triglycerides, renal insufficiency and morbid obesity s/p lap band in 2011. We last saw her in 2018 for evaluation of CP.   Myoview 10/2017:  Nuclear stress EF: 43%. There was no ST segment deviation noted during stress. This is a low risk study. The left ventricular ejection fraction is mildly decreased (45-54%)  Echo 10/2017: EF 55-60% RV normal Personally reviewed  Last visit complaining of chest pain and concerned about worsening PAH and/or ischemic heart disease.  Had a repeat R/LHC with normal coronaries and stable mild PAH.    Here for FU.  Lately she is busy taking care of her father who is end stage dementia.  Breathing is doing okay but still has dyspnea with exertion.  Scared of dying, feels like she's targeted.  Taking lasix as needed had to use it once last week doesn't use it often. Following BP at home. SBPs ~130.    Cardiac studies:   VQ scan 2008: normal  RHC 2008:  RA pressures 13/10 with a mean of 7 mmHg.  RV pressure 39/76 with end diastolic   Dr. [name] performed PFTs on 03/13/12 which were normal. DLCO is minimally reduced, but is corrected to some extent when considerations of volume are made.  FVC 1.91 (81%), FEV1 (88%), FEV1/FVC 87 (107%), FEF 25-75% 2.09 (103%), DLCO 75%  L/RHC 02/2020:  Ao = 178/79 (119) LV  = 168/16 RA = 9   RV = 41/11 PA = 42/17 (29) PCW = 13 Fick cardiac output/index = 5.3/2.4 PVR = 3.0 WU FA sat = 99% PA sat = 69%, 70%   Assessment: 1. Normal coronary arteries with left dominant system 2. Normal LV function EF 55% 3. Mild PAH 4. Severe arterial HTN   RHC 05/2012 RA = 9  RV =  42/6/10  PA = 40/17 (24)  PCW = 12  Fick cardiac output/index = 6.0/2.8  PVR = 2.0 [place] units  FA sat = 99%  PA sat = 73%, 76%  SVC sat = 81%  Ao Pressure: 151/71 (103)  LV Pressure: 150/8/14  There was no signficant gradient across the aortic valve on pullback.  LHC 05/2012: normal cors  Echo 05/2012: LVEF 55-60%.  Trivial TR. Normal RV.    Echo 12/2014: LVEF 55-60%, Grade 1 DD, Trivial MR, Trivial TR, Mild RVH, normal RV function.   Review of systems complete and found to be negative unless listed in HPI.    Labs 08/11/17 Cr 1.84 K 4.2  Past Medical History:  Diagnosis Date   Adnexal cyst    Right simple cyst seen on [place]   Anginal pain (HCC)    Anxiety    Arthritis    Asthma    Atrial fibrillation (HCC)    hx of    CHF (congestive heart failure) (HCC)    Chronic back pain    Chronic hip pain    COPD (chronic obstructive pulmonary disease) ([place])    Coronary artery disease    stents placed    Dyspnea    GERD (gastroesophageal reflux disease)    Headache    due to pinched nerve damaage    Heart murmur  hx of slight murmur    History of bronchitis    History of cardiac catheterization    Normal coronaries 2013   History of kidney stones    Hot flashes    Hypertension    Hypertriglyceridemia    IBS (irritable bowel syndrome)    Internal hemorrhoids    Morbid obesity ([place])    s/p lap band surgery   Myocardial infarction ([place]) 12/2018   times 3   Obesity    Pulmonary HTN ([place])    [place] 2008:  RA pressures 13/10 with a mean of 7 mmHg.  RV pressure 09/23 with end diastolic pressure of 15 mmHg.  PA pressure 49/23 with a mean of 35 mmHg.  Pulmonary capillary wedge 17/50 with a mean of 14 mmHg. The PA saturation was 68%.  RA saturation was 71% and aortic saturation was 90%.  Cardiac output was 6.0 with a cardiac index of 2.80 by Fick.  Normal coronaries by cath 2008.   Renal insufficiency    Sciatica of right side    Sleep apnea    CPAP use does not know settings     Trauma    Type 2 diabetes mellitus (HCC)    type II    Uterine fibroid     Current Outpatient Medications  Medication Sig Dispense Refill   albuterol (PROVENTIL HFA;VENTOLIN HFA) 108 (90 BASE) MCG/ACT inhaler Inhale 2 puffs into the lungs every 6 (six) hours as needed for wheezing or shortness of breath.      albuterol (PROVENTIL) (2.5 MG/3ML) 0.083% nebulizer solution Take 2.5 mg by nebulization every 4 (four) hours as needed for wheezing or shortness of breath.      Alcohol Swabs (B-D SINGLE USE SWABS REGULAR) PADS      ALPRAZolam (XANAX) 0.5 MG tablet Take 0.5 mg by mouth daily as needed for anxiety.      amLODipine (NORVASC) 10 MG tablet Take 10 mg by mouth every evening.      Ascorbic Acid (VITAMIN C) 500 MG CAPS Take 500 mg by mouth daily.      baclofen (LIORESAL) 10 MG tablet Take 1 tablet (10 mg total) by mouth 3 (three) times daily as needed for muscle spasms. 45 tablet 3   Blood Glucose Calibration (TRUE METRIX LEVEL 2) Normal SOLN      cholecalciferol (VITAMIN D3) 25 MCG (1000 UNIT) tablet Take 1,000 Units by mouth daily.     clobetasol (TEMOVATE) 0.05 % external solution Apply 1 application topically daily as needed (scalp irritation).      EPINEPHrine 0.3 mg/0.3 mL IJ SOAJ injection Inject 0.3 mg into the muscle as needed for anaphylaxis.      fluticasone (FLONASE) 50 MCG/ACT nasal spray Place 1 spray into both nostrils daily.      furosemide (LASIX) 40 MG tablet Take 40 mg by mouth daily as needed.     gabapentin (NEURONTIN) 600 MG tablet Take 600 mg by mouth 3 (three) times daily.      isosorbide mononitrate (IMDUR) 30 MG 24 hr tablet Take 30 mg by mouth daily.      linaclotide (LINZESS) 72 MCG capsule Take 72 mcg by mouth daily before breakfast.     losartan-hydrochlorothiazide (HYZAAR) 100-25 MG tablet Take 1 tablet by mouth daily.     magnesium oxide (MAG-OX) 400 MG tablet Take 400 mg by mouth daily.      Multiple Vitamin (MULTIVITAMIN WITH MINERALS) TABS tablet Take 1  tablet by mouth daily.     nitroGLYCERIN (  NITROSTAT) 0.4 MG SL tablet Place 0.4 mg under the tongue every 5 (five) minutes as needed for chest pain.      ondansetron (ZOFRAN) 4 MG tablet Take 4 mg by mouth every 8 (eight) hours as needed for nausea or vomiting.      oxybutynin (DITROPAN-XL) 10 MG 24 hr tablet Take 1 tablet (10 mg total) by mouth daily. 30 tablet 11   oxyCODONE-acetaminophen (PERCOCET) 10-325 MG tablet Take 1 tablet by mouth 4 (four) times daily as needed for pain. 30 tablet 0   potassium chloride SA (K-DUR) 20 MEQ tablet Take 20 mEq by mouth daily.      SYMBICORT 160-4.5 MCG/ACT inhaler Take 2 puffs by mouth 2 (two) times daily.     TRUE METRIX BLOOD GLUCOSE TEST test strip      TRUEplus Lancets 70J MISC      TRULICITY 1.5 GG/8.3MO SOPN Inject 1.5 mg into the skin every 7 (seven) days.      vitamin B-12 (CYANOCOBALAMIN) 1000 MCG tablet Take 1,000 mcg by mouth daily.     Vitamin D, Ergocalciferol, (DRISDOL) 1.25 MG (50000 UNIT) CAPS capsule Take 50,000 Units by mouth every 7 (seven) days.      fenofibrate 160 MG tablet      No current facility-administered medications for this encounter.    Allergies  Allergen Reactions   Ancef [Cefazolin] Anaphylaxis and Shortness Of Breath        Bee Venom Anaphylaxis    As a child   Meloxicam     Gi upset    Ceftin [Cefuroxime Axetil] Swelling    Swollen tongue   Chicken Meat (Diagnostic) Nausea And Vomiting   Ciprofloxacin Cough    Coughed up blood   Macrodantin [Nitrofurantoin] Other (See Comments)    Vomited blood and had dark stool.   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[place] Use   Smoking status: Former    Types: Cigarettes    Quit date: 08/08/1996    Years since quitting: 24.9   Smokeless tobacco: Never  Vaping Use   Vaping Use: Never used  Substance and Sexual Activity   Alcohol use: No   Drug use: No   Sexual activity: Yes    Birth control/protection: Post-menopausal  Other Topics Concern   Not on file  Social History Narrative   Not on file   Social Determinants of Health   Financial Resource Strain: Not on file  Food Insecurity: No Food Insecurity   Worried About Running Out of Food in the Last Year: Never true   [place] in the Last Year: Never true  Transportation Needs: No Transportation Needs   Lack of Transportation (Medical): No   Lack of Transportation (Non-Medical): No  Physical Activity: Not on file  Stress: Not on file  Social Connections: Not on file  Family History  Problem Relation Age of Onset   Diabetes Mother    Heart failure Mother    Breast cancer Sister    CAD Father    Hypertension Father    Heart failure Father    Cancer Paternal Grandfather    Hypertension Paternal Grandmother    Stroke Paternal Grandmother    Other Maternal Grandmother        tonsilitis   Diabetes Maternal Grandfather    Hypertension Maternal Grandfather    Breast cancer Paternal Aunt      PHYSICAL EXAM: Vitals:   07/14/21 1433  BP: (!) 154/60  Pulse: 72  SpO2: 96%  Weight: 110.6 kg (243 lb 12.8 oz)  Height: 5' (1.524 m)   Wt Readings from Last 3 Encounters:  07/14/21 110.6 kg (243 lb 12.8 oz)  06/14/21 112.9 kg (249 lb)  12/13/20 114.8 kg (253 lb)    General:  Obese woman. No resp difficulty HEENT: normal Neck: supple. no JVD. Carotids 2+ bilat; no bruits. No lymphadenopathy or thryomegaly appreciated. Cor: PMI nondisplaced. Regular  rate & rhythm. No rubs, gallops or murmurs. Lungs: clear Abdomen: obese soft, nontender, nondistended. No hepatosplenomegaly. No bruits or masses. Good bowel sounds. Extremities: no cyanosis, clubbing, rash, edema Neuro: alert & orientedx3, cranial nerves grossly intact. moves all 4 extremities w/o difficulty. Affect pleasant   ASSESSMENT & PLAN:  1.Chest pain and dyspnea - cath 2013 without CAD - f/u cath 3/21 no CAD. Mild pulmonary HTN  2. Pulmonary hypertension: Patient had mild pulmonary hypertension on RHC in 6/13.  - Echo in 12/2014 with mild RVH and Grade 1 DD, otherwise unremarkable.  - suspect WHO group 2&3 - Cath 3/21 with mild PAH. Suggest weight loss   3. Morbid obesity - Body mass index is 47.61 kg/m.  - encourage weight loss   4. OSA - continue CPAP  5. Hypertension - BP high here but well controlled at home. No change  6. DM2 - Per PCP.   Can follow-up PRN.   Glori Bickers, MD  3:29 PM

## 2021-07-14 NOTE — Patient Instructions (Signed)
Nice  to see you today  No medication changes today  Follow up with the clinic as needed  If you have any questions or concerns before your next appointment please send [place] a message through [place] or call our office at [phone].    TO LEAVE A MESSAGE FOR THE NURSE SELECT OPTION 2, PLEASE LEAVE A MESSAGE INCLUDING: YOUR NAME DATE OF BIRTH CALL BACK NUMBER REASON FOR CALL**this is important as we prioritize the call backs  YOU WILL RECEIVE A CALL BACK THE SAME DAY AS LONG AS YOU CALL BEFORE 4:00 PM  milAt the [facility], you and your health needs are our priority. As part of our continuing mission to provide you with exceptional heart care, we have created designated Provider Care Teams. These Care Teams include your primary Cardiologist (physician) and Advanced Practice Providers (APPs- Physician Assistants and Nurse Practitioners) who all work together to provide you with the care you need, when you need it.   You may see any of the following providers on your designated Care Team at your next follow up: Dr Glori Bickers Dr Loralie Champagne Dr Patrice Paradise, NP Lyda Jester, [place] Ginnie Smart Audry Riles, PharmD   Please be sure to bring in all your medications bottles to every appointment.

## 2021-07-24 ENCOUNTER — Encounter: Payer: Self-pay | Admitting: Pulmonary Disease

## 2021-07-24 ENCOUNTER — Other Ambulatory Visit: Payer: Self-pay

## 2021-07-24 ENCOUNTER — Ambulatory Visit: Payer: Medicare HMO | Admitting: Pulmonary Disease

## 2021-07-24 VITALS — BP 130/82 | HR 73 | Temp 97.1°F | Ht 60.0 in | Wt 249.0 lb

## 2021-07-24 DIAGNOSIS — Z9989 Dependence on other enabling machines and devices: Secondary | ICD-10-CM | POA: Diagnosis not present

## 2021-07-24 DIAGNOSIS — I272 Pulmonary hypertension, unspecified: Secondary | ICD-10-CM | POA: Diagnosis not present

## 2021-07-24 DIAGNOSIS — J45998 Other asthma: Secondary | ICD-10-CM | POA: Diagnosis not present

## 2021-07-24 DIAGNOSIS — G4733 Obstructive sleep apnea (adult) (pediatric): Secondary | ICD-10-CM | POA: Diagnosis not present

## 2021-07-24 MED ORDER — SYMBICORT 160-4.5 MCG/ACT IN AERO
2.0000 | INHALATION_SPRAY | Freq: Two times a day (BID) | RESPIRATORY_TRACT | 3 refills | Status: AC
Start: 2021-07-24 — End: ?

## 2021-07-24 NOTE — Patient Instructions (Addendum)
Trial of airfit F30 small full face mask to [facility] We discussed mask liners Try to use CPAP at least 6h every night  Refills on symbicort

## 2021-07-24 NOTE — Assessment & Plan Note (Signed)
Continue Symbicort as maintenance with albuterol for rescue

## 2021-07-24 NOTE — Progress Notes (Signed)
Subjective:    Patient ID: Sara Tran, female    DOB: 22-Sep-1956, 65 y.o.   MRN: [id number]  HPI  65 year old remote ex-smoker presents to establish care for  OSA ,asthma and mild pulm hypertension  She has seen my partner Dr. Melvyn Novas, last 11/2020 She reports asthma as a child, PFTs have not shown any evidence of airway obstruction she is maintained on a regimen of Symbicort with albuterol for rescue.  She reports shortness of breath chronically since 2013  She has mild pulmonary hypertension with PVR 3.0 Wood units, categorizes WHO 2/3 with NYHA II symptoms  OSA was diagnosed in 2009 by Dr. Gwenette Greet.  She is maintained on auto CPAP, last machine about 3 years ago with full facemask She reports sporadic compliance but admits to daytime somnolence and fatigue  PMH - HFpEF ([place]), DM-2  Significant tests/ events reviewed   PFT's  03/22/12  FEV1  1.66 (88 % ) ratio 0.87  with DLCO  14.27 (75%) corrects to 5.45 (128%)  for alv volume   - RHC  03/28/20  PA  42/17 with mean 29 and wedge 13 and PRV  3.0 wu  -  12/13/2020   Walked RA  approx   300 ft  @ slow/moderate pace  stopped due to  Sob with sats still 94%     NPSG 01/2013 mild, AHI 10/hour, predominantly REM related events, early REM latency 03/2013 CPAP titration >> 9 cm   Past Medical History:  Diagnosis Date   Adnexal cyst    Right simple cyst seen on [place]   Anginal pain (HCC)    Anxiety    Arthritis    Asthma    Atrial fibrillation (HCC)    hx of    CHF (congestive heart failure) (HCC)    Chronic back pain    Chronic hip pain    COPD (chronic obstructive pulmonary disease) ([place])    Coronary artery disease    stents placed    Dyspnea    GERD (gastroesophageal reflux disease)    Headache    due to pinched nerve damaage    Heart murmur    hx of slight murmur    History of bronchitis    History of cardiac catheterization    Normal coronaries 2013   History of kidney stones    Hot flashes    Hypertension     Hypertriglyceridemia    IBS (irritable bowel syndrome)    Internal hemorrhoids    Morbid obesity ([place])    s/p lap band surgery   Myocardial infarction ([place]) 12/2018   times 3   Obesity    Pulmonary HTN ([place])    [place] 2008:  RA pressures 13/10 with a mean of 7 mmHg.  RV pressure 16/94 with end diastolic pressure of 15 mmHg.  PA pressure 49/23 with a mean of 35 mmHg.  Pulmonary capillary wedge 17/50 with a mean of 14 mmHg. The PA saturation was 68%.  RA saturation was 71% and aortic saturation was 90%.  Cardiac output was 6.0 with a cardiac index of 2.80 by Fick.  Normal coronaries by cath 2008.   Renal insufficiency    Sciatica of right side    Sleep apnea    CPAP use does not know settings    Trauma    Type 2 diabetes mellitus ([place])    type II    Uterine fibroid     Past Surgical History:  Procedure Laterality Date   accupuncture  APPENDECTOMY     CARDIAC CATHETERIZATION  07/2007, 05/2012   Normal coronary arteries   COLONOSCOPY  10/18/2010   SLF:2-mm sessile sigmoid polyp/diverticula, tortuous colon.  Biopsies of sigmoid colon polyp and random colon biopsies benign   COLONOSCOPY WITH PROPOFOL N/A 08/09/2020   Procedure: COLONOSCOPY WITH PROPOFOL;  Surgeon: Eloise Harman, DO;  [facility];  Service: Endoscopy;  Laterality: N/A;  11:00am   CYSTOSCOPY/URETEROSCOPY/HOLMIUM LASER/STENT PLACEMENT Right 08/01/2017   Procedure: CYSTOSCOPY/ RIGHT URETEROSCOPY/ RIGHT RETROGRADE/ HOLMIUM LASER  AND RIGHT STENT PLACEMENT FIRST STAGE;  Surgeon: Irine Seal, MD;  Location: [facility];  Service: Urology;  Laterality: Right;   CYSTOSCOPY/URETEROSCOPY/HOLMIUM LASER/STENT PLACEMENT Right 08/08/2017   Procedure: RIGHT URETEROSCOPY WITH HOLMIUM LASER RIGHT STENT PLACEMENT;  Surgeon: Irine Seal, MD;  Location: [facility];  Service: Urology;  Laterality: Right;   EXTRACORPOREAL SHOCK WAVE LITHOTRIPSY Right 08/22/2017   Procedure: RIGHT EXTRACORPOREAL SHOCK WAVE LITHOTRIPSY (ESWL);  Surgeon: Alexis Frock, MD;  Location: [facility];  Service: Urology;  Laterality: Right;   HEMORRHOID BANDING  07/2012   Dr. Oneida Alar   LAPAROSCOPIC CHOLECYSTECTOMY  1985   LAPAROSCOPIC GASTRIC BANDING  12/12/2010   LEFT AND RIGHT HEART CATHETERIZATION WITH CORONARY ANGIOGRAM N/A 06/03/2012   Procedure: LEFT AND RIGHT HEART CATHETERIZATION WITH CORONARY ANGIOGRAM;  Surgeon: Jolaine Artist, MD;  Location: [facility] CATH LAB;  Service: Cardiovascular;  Laterality: N/A;   PILONIDAL CYST EXCISION  1974   RIGHT/LEFT HEART CATH AND CORONARY ANGIOGRAPHY N/A 03/28/2020   Procedure: RIGHT/LEFT HEART CATH AND CORONARY ANGIOGRAPHY;  Surgeon: Jolaine Artist, MD;  Location: [place] CV LAB;  Service: Cardiovascular;  Laterality: N/A;   TUMOR EXCISION  10/2011   Left thigh   TUMOR EXCISION  10/2011   Face    Allergies  Allergen Reactions   Ancef [Cefazolin] Anaphylaxis and Shortness Of Breath        Bee Venom Anaphylaxis    As a child   Meloxicam     Gi upset    Ceftin [Cefuroxime Axetil] Swelling    Swollen tongue   Chicken Meat (Diagnostic) Nausea And Vomiting   Ciprofloxacin Cough    Coughed up blood   Macrodantin [Nitrofurantoin] Other (See Comments)    Vomited blood and had dark stool.   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 [place] Use   Smoking status: Former    Types: Cigarettes    Quit date: 08/08/1996    Years since quitting: 24.9   Smokeless tobacco: Never  Vaping Use    Vaping Use: Never used  Substance and Sexual Activity   Alcohol use: No   Drug use: No   Sexual activity: Yes    Birth control/protection: Post-menopausal  Other Topics Concern   Not on file  Social History Narrative   Not on file   Social Determinants of Health  Financial Resource Strain: Not on file  Food Insecurity: No Food Insecurity   Worried About Charity fundraiser in the Last Year: Never true   Arboriculturist in the Last Year: Never true  Transportation Needs: No Transportation Needs   Lack of Transportation (Medical): No   Lack of Transportation (Non-Medical): No  Physical Activity: Not on file  Stress: Not on file  Social Connections: Not on file  Intimate Partner Violence: Not on file    Family History  Problem Relation Age of Onset   Diabetes Mother    Heart failure Mother    Breast cancer Sister    CAD Father    Hypertension Father    Heart failure Father    Cancer Paternal Grandfather    Hypertension Paternal Grandmother    Stroke Paternal Grandmother    Other Maternal Grandmother        tonsilitis   Diabetes Maternal Grandfather    Hypertension Maternal Grandfather    Breast cancer Paternal Aunt        Review of Systems Constitutional: negative for anorexia, fevers and sweats  Eyes: negative for irritation, redness and visual disturbance  Ears, nose, mouth, throat, and face: negative for earaches, epistaxis, nasal congestion and sore throat  Respiratory: negative for cough,  sputum and wheezing  Cardiovascular: negative for chest pain, orthopnea, palpitations and syncope  Gastrointestinal: negative for abdominal pain, constipation, diarrhea, melena, nausea and vomiting  Genitourinary:negative for dysuria, frequency and hematuria  Hematologic/lymphatic: negative for bleeding, easy bruising and lymphadenopathy  Musculoskeletal:negative for arthralgias, muscle weakness and stiff joints  Neurological: negative for coordination problems, gait  problems, headaches and weakness  Endocrine: negative for diabetic symptoms including polydipsia, polyuria and weight loss     Objective:   Physical Exam  Gen. Pleasant, obese, in no distress, normal affect ENT - no pallor,icterus, no post nasal drip, class 2-3 airway Neck: No JVD, no thyromegaly, no carotid bruits Lungs: no use of accessory muscles, no dullness to percussion, decreased without rales or rhonchi  Cardiovascular: Rhythm regular, heart sounds  normal, no murmurs or gallops, 1+ peripheral edema Abdomen: soft and non-tender, no hepatosplenomegaly, BS normal. Musculoskeletal: No deformities, no cyanosis or clubbing Neuro:  alert, non focal, no tremors       Assessment & Plan:

## 2021-07-24 NOTE — Assessment & Plan Note (Signed)
Mild, WHO 2/3 -Multifactorial diastolic heart failure, OSA, obesity. Best to focus on weight loss and CPAP compliance

## 2021-07-24 NOTE — Assessment & Plan Note (Signed)
CPAP report was reviewed which shows average usage 4 hours, with good control of events, average pressure of 10 to 11 cm.  I spent considerable time  reviewing reasons for poor compliance, main issue seems to be mask fitting.  We reviewed different types of masks and she seemed to prefer the AirFit F30, prescription will be sent. Change auto CPAP settings to 6 to 12 cm  Weight loss encouraged, compliance with goal of at least 4-6 hrs every night is the expectation. Advised against medications with sedative side effects Cautioned against driving when sleepy - understanding that sleepiness will vary on a day to day basis

## 2021-07-27 ENCOUNTER — Other Ambulatory Visit: Payer: Self-pay

## 2021-07-27 ENCOUNTER — Encounter: Payer: Medicare HMO | Attending: Physical Medicine & Rehabilitation | Admitting: Physical Medicine & Rehabilitation

## 2021-07-27 ENCOUNTER — Encounter: Payer: Self-pay | Admitting: Physical Medicine & Rehabilitation

## 2021-07-27 VITALS — BP 150/69 | HR 58 | Temp 98.7°F | Wt 248.0 lb

## 2021-07-27 DIAGNOSIS — G5601 Carpal tunnel syndrome, right upper limb: Secondary | ICD-10-CM | POA: Insufficient documentation

## 2021-07-27 DIAGNOSIS — R6889 Other general symptoms and signs: Secondary | ICD-10-CM | POA: Diagnosis not present

## 2021-07-27 NOTE — Patient Instructions (Signed)
Acupuncture Acupuncture is a type of treatment that involves stimulating specific points on your body by inserting thin needles through your skin. Acupuncture is often used to treat pain, but it may also be used to help relieve other types of symptoms. Your health care provider may recommend acupuncture to help treat various conditions, such as: Migraine headaches. Tension headaches. Arthritis pain. Addiction. Chronic pain. Nausea and vomiting after a surgery. High blood pressure (hypertension). Chronic obstructive pulmonary disease (COPD). Nausea caused by cancer treatment. Sudden or severe (acute) pain. Acupuncture is based on traditional [place] medicine, which recognizes more than 2,000 points on the body that connect energy pathways (meridians) through the body. The goal in stimulating these points is to balance the physical, emotional, and mental energy in your body. Acupuncture is done by a health care provider who has specialized training (licensed acupuncture practitioner). Treatment often requires several acupuncture sessions. You may have acupuncturealong with other medical treatments. Tell a health care provider about: Any allergies you have. All medicines you are taking, including vitamins, herbs, eye drops, creams, and over-the-counter medicines. Any blood disorders you have. Any surgeries you have had. Any medical conditions you have. Whether you are pregnant or may be pregnant. What are the risks? Generally, this is a safe treatment. However, problems may occur, including: Skin infection. Damage to organs or structures that are under the skin where a needle is placed. What happens before the treatment? Your acupuncture practitioner will ask about your medical history and your symptoms. You may have a physical exam. What happens during the treatment? The exact procedure will depend on your condition and how your acupuncture provider treats it. In general: Your skin will  be cleaned with a germ-killing (antiseptic) solution. Your acupuncture practitioner will open a new set of germ-free (sterile) needles. The needles will be gently inserted into your skin. They will be left in place for a certain amount of time. You may feel slight pain or a tingling sensation. Your acupuncture practitioner may: Apply electrical energy to the needles. Adjust the needles in certain ways. After your procedure, the acupuncture practitioner will remove the needles, throw them away, and clean your skin. The procedure may vary among health care providers. What can I expect after the treatment? People react differently to acupuncture. Make sure you ask your acupuncture provider what to expect after your treatment. It is common to have: Minor bruising. Mild pain. A small amount of bleeding. Follow these instructions at home: Follow any instructions given by your provider after the treatment. Keep all follow-up visits as told by your health care provider. This is important. Contact a health care provider if: You have questions about your reaction to the treatment. You have soreness. You have skin irritation or redness. You have a fever. Summary Acupuncture is a type of treatment that involves stimulating specific points on your body by inserting thin needles through your skin. This treatment is often used to treat pain, but it may also be used to help relieve other types of symptoms. The exact procedure will depend on your condition and how your acupuncture provider treats it. This information is not intended to replace advice given to you by your health care provider. Make sure you discuss any questions you have with your healthcare provider. Document Revised: 12/01/2020 Document Reviewed: 12/01/2020 Elsevier Patient Education  2022 [employer].

## 2021-07-27 NOTE — Progress Notes (Signed)
Acupuncture treatment #1 Last treatment 12/08/2020 (Completed 10 treatments in 2021) Indication right hand paresthesias and wrist pain recurrent after increasing usage, caring for father with dementia Needles placed at Th4-Th5 T8  as well as MH 6 and MH 7 electrical stimulation at 4 Hz between TH 4 and MH6 as well as between Th5 and MH 7 Patient tolerated procedure well Post procedure instructions given Total treatment time 25 minutes  Given chronicity tried lower freq stim

## 2021-08-04 DIAGNOSIS — G894 Chronic pain syndrome: Secondary | ICD-10-CM | POA: Diagnosis not present

## 2021-08-07 DIAGNOSIS — G4733 Obstructive sleep apnea (adult) (pediatric): Secondary | ICD-10-CM | POA: Diagnosis not present

## 2021-08-16 ENCOUNTER — Other Ambulatory Visit: Payer: Self-pay

## 2021-08-16 NOTE — Patient Outreach (Signed)
[facility]) Care Management  08/16/2021  Sara Tran [birth year] [id number]   Telephone Assessment   Successful outreach call placed to patient. She shares that her she has been busy dealing and coping with the recent loss of her father passing away on last week.Condolences and support given. Patient coping adequately. Denies needing any additional support at this time.     Medications Reviewed Today     Reviewed by Hayden Pedro, RN (Registered Nurse) on 08/16/21 at 1100  Med List Status: <None>   Medication Order Taking? Sig Documenting Provider Last Dose Status Informant  albuterol (PROVENTIL HFA;VENTOLIN HFA) 108 (90 BASE) MCG/ACT inhaler [id number] No Inhale 2 puffs into the lungs every 6 (six) hours as needed for wheezing or shortness of breath.  [provider] Taking Active Self  albuterol (PROVENTIL) (2.5 MG/3ML) 0.083% nebulizer solution [id number] No Take 2.5 mg by nebulization every 4 (four) hours as needed for wheezing or shortness of breath.  [provider] Taking Active Self           Med Note [name], JESSICA R   Tue Jul 23, 2017  2:17 PM)    Alcohol Swabs (B-D SINGLE USE SWABS REGULAR) PADS [id number] No  [provider] Taking Active   ALPRAZolam (XANAX) 0.5 MG tablet [id number] No Take 0.5 mg by mouth daily as needed for anxiety.  [provider] Taking Active Self  amLODipine (NORVASC) 10 MG tablet [id number] No Take 10 mg by mouth every evening.  [provider] Taking Active Self  Ascorbic Acid (VITAMIN C) 500 MG CAPS [id number] No Take 500 mg by mouth daily.  [provider] Taking Active Self  baclofen (LIORESAL) 10 MG tablet [id number] No Take 1 tablet (10 mg total) by mouth 3 (three) times daily as needed for muscle spasms. Meredith Staggers, MD Taking Active Self  Blood Glucose Calibration (TRUE METRIX LEVEL 2) Normal SOLN [id number] No  [provider] Taking Active    cholecalciferol (VITAMIN D3) 25 MCG (1000 UNIT) tablet [id number] No Take 1,000 Units by mouth daily. [provider] Taking Active Self  clobetasol (TEMOVATE) 0.05 % external solution [id number] No Apply 1 application topically daily as needed (scalp irritation).  [provider] Taking Active Self  EPINEPHrine 0.3 mg/0.3 mL IJ SOAJ injection [id number] No Inject 0.3 mg into the muscle as needed for anaphylaxis.  [provider] Taking Active Self           Med Note [name], [name]   Wed Jul 06, 2020  1:23 PM)    fenofibrate 160 MG tablet [id number] No Take 160 mg by mouth daily. [provider] Taking Active   fluticasone (FLONASE) 50 MCG/ACT nasal spray [id number] No Place 1 spray into both nostrils daily.  [provider] Taking Active Self  furosemide (LASIX) 40 MG tablet [id number] No Take 40 mg by mouth daily as needed. [provider] Taking Active Self  gabapentin (NEURONTIN) 600 MG tablet [id number] No Take 600 mg by mouth 3 (three) times daily.  [provider] Taking Active Self  isosorbide mononitrate (IMDUR) 30 MG 24 hr tablet [id number] No Take 30 mg by mouth daily.  [provider] Taking Active Self  levocetirizine (XYZAL) 5 MG tablet [id number] No Take 5 mg by mouth in the morning. [provider] Taking Active   linaclotide Rolan Lipa) 72 MCG capsule [id number] No Take 72 mcg by mouth daily before breakfast. [provider] Taking Active Self  losartan-hydrochlorothiazide Konrad Penta)  100-25 MG tablet [id number] No Take 1 tablet by mouth daily. [provider] Taking Active Self  magnesium oxide (MAG-OX) 400 MG tablet [id number] No Take 400 mg by mouth daily.  [provider] Taking Active Self  Multiple Vitamin (MULTIVITAMIN WITH MINERALS) TABS tablet [id number] No Take 1 tablet by mouth daily. [provider] Taking Active Self  nitroGLYCERIN (NITROSTAT) 0.4 MG SL tablet [id number]  No Place 0.4 mg under the tongue every 5 (five) minutes as needed for chest pain.  [provider] Taking Active Self  ondansetron (ZOFRAN) 4 MG tablet [id number] No Take 4 mg by mouth every 8 (eight) hours as needed for nausea or vomiting.  [provider] Taking Active Self  oxybutynin (DITROPAN-XL) 10 MG 24 hr tablet [id number] No Take 1 tablet (10 mg total) by mouth daily. Irine Seal, MD Taking Active   oxyCODONE-acetaminophen (PERCOCET) 10-325 MG tablet [id number] No Take 1 tablet by mouth 4 (four) times daily as needed for pain. Meredith Staggers, MD Taking Active   potassium chloride SA (K-DUR) 20 MEQ tablet [id number] No Take 20 mEq by mouth daily.  [provider] Taking Active Self  SYMBICORT 160-4.5 MCG/ACT inhaler [id number] No Inhale 2 puffs into the lungs 2 (two) times daily. Rigoberto Noel, MD Taking Active   TRUE METRIX BLOOD GLUCOSE TEST test strip [id number] No  [provider] Taking Active   TRUEplus Lancets 33G [place] [id number] No  [provider] Taking Active   TRULICITY 1.5 SP/2.3RA SOPN [id number] No Inject 1.5 mg into the skin every 7 (seven) days.  [provider] Taking Active Self           Med Note [facility], [name]   Wed Mar 16, 2020  2:10 PM)    vitamin B-12 (CYANOCOBALAMIN) 1000 MCG tablet [id number] No Take 1,000 mcg by mouth daily. [provider] Taking Active Self  Vitamin D, Ergocalciferol, (DRISDOL) 1.25 MG (50000 UNIT) CAPS capsule [id number] No Take 50,000 Units by mouth every 7 (seven) days.  [provider] Taking Active Self             Goals Addressed               This Visit's Progress     (THN)Set My Target A1C-Diabetes Type 2 (pt-stated)        Timeframe:  Long-Range Goal Priority:  High Start Date: 03/20/2021                            Expected End Date: Dec 2022                      Follow Up Date Nov 2022   - set target A1C -maintain A1C level of 7.0 or less   Why is  this important?   Your target A1C is decided together by you and your doctor.  It is based on several things like your age and other health issues.    Notes:  03/20/21-Most recent A1C level 5.6(Nov 2021). Patient states due for labwork within the next 1-2 months.   08/16/2021-Patient admits she has been doing some "comfort/emotional eating" while dealing with loss of her father but reports she is "getting back on track." She has not checked cbg yet for today and unable to recall last reading. Most recent A1C on file 5.7(June 2022).        Plan: RN CM discussed with patient next  outreach within the month of Nov. Patient agrees to care plan and follow up. Patient gave verbal consent and in agreement with RN CM follow up and timeframe. Patient aware that they may contact RN CM sooner for any issues or concerns. RN CM reviewed goals and plan of care with patient.   Enzo Montgomery, RN,BSN,CCM [facility] Telephonic Care Management Coordinator Direct Phone: [phone] Toll Free: [phone] Fax: [phone]

## 2021-08-22 DIAGNOSIS — Z0100 Encounter for examination of eyes and vision without abnormal findings: Secondary | ICD-10-CM | POA: Diagnosis not present

## 2021-08-22 DIAGNOSIS — H524 Presbyopia: Secondary | ICD-10-CM | POA: Diagnosis not present

## 2021-08-22 DIAGNOSIS — E119 Type 2 diabetes mellitus without complications: Secondary | ICD-10-CM | POA: Diagnosis not present

## 2021-08-29 ENCOUNTER — Telehealth: Payer: Self-pay | Admitting: Pulmonary Disease

## 2021-08-29 NOTE — Telephone Encounter (Signed)
Tried calling pt and there was no answer- LMTCB.  

## 2021-08-30 NOTE — Telephone Encounter (Signed)
Spoke with the pt  She is c/o increased daytime sleepiness recently  She has a harder time falling asleep She feels not getting enough pressure from her CPAP  I printed DL from [place] and faxed to Dr Elsworth Soho in the [facility] office since he will be there 08/31/21  Please review DL 08/31/21 and advise recommendations, thanks!

## 2021-09-03 ENCOUNTER — Encounter: Payer: Self-pay | Admitting: Pulmonary Disease

## 2021-09-05 NOTE — Telephone Encounter (Signed)
ATC patient, unable to leave vm due to mailbox being full. Will try again

## 2021-09-05 NOTE — Telephone Encounter (Signed)
You; Elby Beck R, CMA 1 hour ago (8:57 AM)   Have not received this download     I faxed DL to [facility] office attn [place], thanks

## 2021-09-05 NOTE — Telephone Encounter (Signed)
Tried calling the pt and there was no answer and her VM was available, will call back.

## 2021-09-06 ENCOUNTER — Encounter: Payer: Self-pay | Admitting: *Deleted

## 2021-09-06 NOTE — Telephone Encounter (Signed)
Called pt and again, no answer and her mb was full  Mailed letter

## 2021-09-13 NOTE — Telephone Encounter (Signed)
Pt received letter and calling back.

## 2021-09-15 DIAGNOSIS — G4733 Obstructive sleep apnea (adult) (pediatric): Secondary | ICD-10-CM | POA: Diagnosis not present

## 2021-09-26 ENCOUNTER — Encounter: Payer: Self-pay | Admitting: Physical Medicine & Rehabilitation

## 2021-09-26 ENCOUNTER — Other Ambulatory Visit: Payer: Self-pay

## 2021-09-26 ENCOUNTER — Encounter: Payer: Medicare HMO | Attending: Physical Medicine & Rehabilitation | Admitting: Physical Medicine & Rehabilitation

## 2021-09-26 VITALS — BP 132/80 | HR 85 | Temp 98.5°F | Ht 60.0 in | Wt 245.0 lb

## 2021-09-26 DIAGNOSIS — G5601 Carpal tunnel syndrome, right upper limb: Secondary | ICD-10-CM | POA: Insufficient documentation

## 2021-09-26 NOTE — Progress Notes (Signed)
Acupuncture treatment #2 Last treatment 12/08/2020 (Completed 10 treatments in 2021) Indication right hand paresthesias and wrist pain recurrent after increasing usage, caring for father with dementia Needles placed at Th4-Th5 T8  as well as MH 6 and MH 7 electrical stimulation at 4 Hz between [place] 8 and MH6 as well as between Th5 and MH 7 Patient tolerated procedure well Post procedure instructions given Total treatment time 25 minutes  Given chronicity tried lower freq stim

## 2021-09-26 NOTE — Patient Instructions (Signed)
Acupuncture Acupuncture is a type of treatment that involves stimulating specific points on your body by inserting thin needles through your skin. Acupuncture is often used to treat pain, but it may also be used to help relieve other types of symptoms. Your health care provider may recommend acupuncture to help treat various conditions, such as: Migraine headaches. Tension headaches. Arthritis pain. Addiction. Chronic pain. Nausea and vomiting after a surgery. High blood pressure (hypertension). Chronic obstructive pulmonary disease (COPD). Nausea caused by cancer treatment. Sudden or severe (acute) pain. Acupuncture is based on traditional [place] medicine, which recognizes more than 2,000 points on the body that connect energy pathways (meridians) through the body. The goal in stimulating these points is to balance the physical, emotional, and mental energy in your body. Acupuncture is done by a health care provider who has specialized training (licensed acupuncture practitioner). Treatment often requires several acupuncture sessions. You may have acupuncture along with other medical treatments. Tell a health care provider about: Any allergies you have. All medicines you are taking, including vitamins, herbs, eye drops, creams, and over-the-counter medicines. Any blood disorders you have. Any surgeries you have had. Any medical conditions you have. Whether you are pregnant or may be pregnant. What are the risks? Generally, this is a safe treatment. However, problems may occur, including: Skin infection. Damage to organs or structures that are under the skin where a needle is placed. What happens before the treatment? Your acupuncture practitioner will ask about your medical history and your symptoms. You may have a physical exam. What happens during the treatment? The exact procedure will depend on your condition and how your acupuncture provider treats it. In general: Your skin will  be cleaned with a germ-killing (antiseptic) solution. Your acupuncture practitioner will open a new set of germ-free (sterile) needles. The needles will be gently inserted into your skin. They will be left in place for a certain amount of time. You may feel slight pain or a tingling sensation. Your acupuncture practitioner may: Apply electrical energy to the needles. Adjust the needles in certain ways. After your procedure, the acupuncture practitioner will remove the needles, throw them away, and clean your skin. The procedure may vary among health care providers. What can I expect after the treatment? People react differently to acupuncture. Make sure you ask your acupuncture provider what to expect after your treatment. It is common to have: Minor bruising. Mild pain. A small amount of bleeding. Follow these instructions at home: Follow any instructions given by your provider after the treatment. Keep all follow-up visits as told by your health care provider. This is important. Contact a health care provider if: You have questions about your reaction to the treatment. You have soreness. You have skin irritation or redness. You have a fever. Summary Acupuncture is a type of treatment that involves stimulating specific points on your body by inserting thin needles through your skin. This treatment is often used to treat pain, but it may also be used to help relieve other types of symptoms. The exact procedure will depend on your condition and how your acupuncture provider treats it. This information is not intended to replace advice given to you by your health care provider. Make sure you discuss any questions you have with your health care provider. Document Revised: 12/01/2020 Document Reviewed: 12/01/2020 Elsevier Patient Education  2022 [employer].

## 2021-09-28 DIAGNOSIS — I10 Essential (primary) hypertension: Secondary | ICD-10-CM | POA: Diagnosis not present

## 2021-09-28 DIAGNOSIS — E1129 Type 2 diabetes mellitus with other diabetic kidney complication: Secondary | ICD-10-CM | POA: Diagnosis not present

## 2021-09-28 DIAGNOSIS — G8929 Other chronic pain: Secondary | ICD-10-CM | POA: Diagnosis not present

## 2021-11-15 ENCOUNTER — Other Ambulatory Visit: Payer: Self-pay

## 2021-11-15 NOTE — Patient Outreach (Signed)
[facility]) Care Management  11/15/2021  Sara Tran 1956-10-29 [id number]   Telephone Assessment   Successful outreach call placed to patient. She denies any new issues or concerns at present. She reports she is doing well. She is coping and still dealing with the loss of her father a few months ago. She continues to reside in her home. Patient is independent with ADLs/IADLs. Appetite remains good and wgt stable. Denies any recent falls. No RN CM needs or concerns noted this call.   Medications Reviewed Today     Reviewed by Hayden Pedro, RN (Registered Nurse) on 11/15/21 at 6  Med List Status: <None>   Medication Order Taking? Sig Documenting Provider Last Dose Status Informant  albuterol (PROVENTIL HFA;VENTOLIN HFA) 108 (90 BASE) MCG/ACT inhaler [id number] No Inhale 2 puffs into the lungs every 6 (six) hours as needed for wheezing or shortness of breath.  [provider] Taking Active Self  albuterol (PROVENTIL) (2.5 MG/3ML) 0.083% nebulizer solution [id number] No Take 2.5 mg by nebulization every 4 (four) hours as needed for wheezing or shortness of breath.  [provider] Taking Active Self           Med Note [name], JESSICA R   Tue Jul 23, 2017  2:17 PM)    Alcohol Swabs (B-D SINGLE USE SWABS REGULAR) PADS [id number] No  [provider] Taking Active   ALPRAZolam (XANAX) 0.5 MG tablet [id number] No Take 0.5 mg by mouth daily as needed for anxiety.  [provider] Taking Active Self  amLODipine (NORVASC) 10 MG tablet [id number] No Take 10 mg by mouth every evening.  [provider] Taking Active Self  Ascorbic Acid (VITAMIN C) 500 MG CAPS [id number] No Take 500 mg by mouth daily.  [provider] Taking Active Self  baclofen (LIORESAL) 10 MG tablet [id number] No Take 1 tablet (10 mg total) by mouth 3 (three) times daily as needed for muscle spasms. Meredith Staggers, MD Taking Active Self  Blood  Glucose Calibration (TRUE METRIX LEVEL 2) Normal SOLN [id number] No  [provider] Taking Active   cholecalciferol (VITAMIN D3) 25 MCG (1000 UNIT) tablet [id number] No Take 1,000 Units by mouth daily. [provider] Taking Active Self  clobetasol (TEMOVATE) 0.05 % external solution [id number] No Apply 1 application topically daily as needed (scalp irritation).  [provider] Taking Active Self  EPINEPHrine 0.3 mg/0.3 mL IJ SOAJ injection [id number] No Inject 0.3 mg into the muscle as needed for anaphylaxis.  [provider] Taking Active Self           Med Note [name], [name]   Wed Jul 06, 2020  1:23 PM)    fenofibrate 160 MG tablet [id number] No Take 160 mg by mouth daily. [provider] Taking Active   fluticasone (FLONASE) 50 MCG/ACT nasal spray [id number] No Place 1 spray into both nostrils daily.  [provider] Taking Active Self  furosemide (LASIX) 40 MG tablet [id number] No Take 40 mg by mouth daily as needed. [provider] Taking Active Self  gabapentin (NEURONTIN) 600 MG tablet [id number] No Take 600 mg by mouth 3 (three) times daily.  [provider] Taking Active Self  isosorbide mononitrate (IMDUR) 30 MG 24 hr tablet [id number] No Take 30 mg by mouth daily.  [provider] Taking Active Self  levocetirizine (XYZAL) 5 MG tablet [id number] No Take 5 mg by mouth in the morning. [provider] Taking Active   linaclotide (  LINZESS) 72 MCG capsule [id number] No Take 72 mcg by mouth daily before breakfast. [provider] Taking Active Self  losartan-hydrochlorothiazide (HYZAAR) 100-25 MG tablet [id number] No Take 1 tablet by mouth daily. [provider] Taking Active Self  magnesium oxide (MAG-OX) 400 MG tablet [id number] No Take 400 mg by mouth daily.  [provider] Taking Active Self  methylPREDNISolone (MEDROL DOSEPAK) 4 MG TBPK tablet [id number] No  [provider]  Taking Active   Multiple Vitamin (MULTIVITAMIN WITH MINERALS) TABS tablet [id number] No Take 1 tablet by mouth daily. [provider] Taking Active Self  nitroGLYCERIN (NITROSTAT) 0.4 MG SL tablet [id number] No Place 0.4 mg under the tongue every 5 (five) minutes as needed for chest pain.  [provider] Taking Active Self  ondansetron (ZOFRAN) 4 MG tablet [id number] No Take 4 mg by mouth every 8 (eight) hours as needed for nausea or vomiting.  [provider] Taking Active Self  oxybutynin (DITROPAN-XL) 10 MG 24 hr tablet [id number] No Take 1 tablet (10 mg total) by mouth daily. Irine Seal, MD Taking Active   oxyCODONE-acetaminophen (PERCOCET) 10-325 MG tablet [id number] No Take 1 tablet by mouth 4 (four) times daily as needed for pain. Meredith Staggers, MD Taking Active            Med Note Jerline Pain, [facility] M   Tue Sep 26, 2021  2:53 PM) Last taken today. Not here for count  potassium chloride SA (K-DUR) 20 MEQ tablet [id number] No Take 20 mEq by mouth daily.  [provider] Taking Active Self  SYMBICORT 160-4.5 MCG/ACT inhaler [id number] No Inhale 2 puffs into the lungs 2 (two) times daily. Rigoberto Noel, MD Taking Active   TRUE METRIX BLOOD GLUCOSE TEST test strip [id number] No  [provider] Taking Active   TRUEplus Lancets 33G [place] [id number] No  [provider] Taking Active   TRULICITY 1.5 XU/3.8BF SOPN [id number] No Inject 1.5 mg into the skin every 7 (seven) days.  [provider] Taking Active Self           Med Note [facility], [name]   Wed Mar 16, 2020  2:10 PM)    vitamin B-12 (CYANOCOBALAMIN) 1000 MCG tablet [id number] No Take 1,000 mcg by mouth daily. [provider] Taking Active Self  Vitamin D, Ergocalciferol, (DRISDOL) 1.25 MG (50000 UNIT) CAPS capsule [id number] No Take 50,000 Units by mouth every 7 (seven) days.  [provider] Taking Active Self              Goals Addressed                This Visit's Progress     (THN)Monitor and Manage My Blood Sugar-Diabetes Type 2 (pt-stated)        Timeframe:  Long-Range Goal Priority:  High Start Date: 03/20/2021                            Expected End Date:   June 2022                   Follow Up Date June 2022   - check blood sugar at prescribed times - check blood sugar if I feel it is too high or too low - take the blood sugar meter to all doctor visits    Why is this important?   Checking your blood sugar at home helps to keep it from getting very  high or very low.  Writing the results in a diary or log helps the doctor know how to care for you.  Your blood sugar log should have the time, date and the results.  Also, write down the amount of insulin or other medicine that you take.  Other information, like what you ate, exercise done and how you were feeling, will also be helpful.     Notes:  03/20/21-Patient reports good glycemic control. She is monitoring cbgs in he home and adhering to diabetic diet.She reports med adherence.   11/15/21-Patient reports cbgs ranging in the 80s-170s. Denies any issues with hypo/hyperglycemia.       (THN)Set My Target A1C-Diabetes Type 2 (pt-stated)        Timeframe:  Long-Range Goal Priority:  High Start Date: 03/20/2021                            Expected End Date: Dec 2022                      Follow Up Date Nov 2022   - set target A1C -maintain A1C level of 7.0 or less   Why is this important?   Your target A1C is decided together by you and your doctor.  It is based on several things like your age and other health issues.    Notes:  03/20/21-Most recent A1C level 5.6(Nov 2021). Patient states due for labwork within the next 1-2 months.   08/16/2021-Patient admits she has been doing some "comfort/emotional eating" while dealing with loss of her father but reports she is "getting back on track." She has not checked cbg yet for today and unable to recall last reading. Most recent A1C  on file 5.7(June 2022).  11/15/21-Patient reports cbgs have been ranging in the 80s-170s. She is being more mindful of what she eats. Wgt stable. She is not due for another A1C level check until next year.          Plan: RN CM discussed with patient next outreach within the month of Feb. Patient agrees to care plan and follow up. RN CM will send quarterly update to PCP.  Enzo Montgomery, RN,BSN,CCM [place] [facility] Telephonic Care Management Coordinator Direct Phone: [phone] Toll Free: [phone] Fax: [phone]

## 2021-11-21 ENCOUNTER — Encounter: Payer: Self-pay | Admitting: Physical Medicine & Rehabilitation

## 2021-11-21 ENCOUNTER — Encounter: Payer: Medicare HMO | Attending: Physical Medicine & Rehabilitation | Admitting: Physical Medicine & Rehabilitation

## 2021-11-21 ENCOUNTER — Other Ambulatory Visit: Payer: Self-pay

## 2021-11-21 VITALS — BP 161/76 | HR 85 | Temp 98.4°F | Ht 60.0 in | Wt 255.0 lb

## 2021-11-21 DIAGNOSIS — G5601 Carpal tunnel syndrome, right upper limb: Secondary | ICD-10-CM | POA: Insufficient documentation

## 2021-11-21 NOTE — Patient Instructions (Signed)
Acupuncture Acupuncture is a type of treatment that involves stimulating specific points on your body by inserting thin needles through your skin. Acupuncture is often used to treat pain, but it may also be used to help relieve other types of symptoms. Your health care provider may recommend acupuncture to help treat various conditions, such as: Migraine headaches. Tension headaches. Arthritis pain. Addiction. Chronic pain. Nausea and vomiting after a surgery. High blood pressure (hypertension). Chronic obstructive pulmonary disease (COPD). Nausea caused by cancer treatment. Sudden or severe (acute) pain. Acupuncture is based on traditional [place] medicine, which recognizes more than 2,000 points on the body that connect energy pathways (meridians) through the body. The goal in stimulating these points is to balance the physical, emotional, and mental energy in your body. Acupuncture is done by a health care provider who has specialized training (licensed acupuncture practitioner). Treatment often requires several acupuncture sessions. You may have acupuncture along with other medical treatments. Tell a health care provider about: Any allergies you have. All medicines you are taking, including vitamins, herbs, eye drops, creams, and over-the-counter medicines. Any blood disorders you have. Any surgeries you have had. Any medical conditions you have. Whether you are pregnant or may be pregnant. What are the risks? Generally, this is a safe treatment. However, problems may occur, including: Skin infection. Damage to organs or structures that are under the skin where a needle is placed. What happens before the treatment? Your acupuncture practitioner will ask about your medical history and your symptoms. You may have a physical exam. What happens during the treatment? The exact procedure will depend on your condition and how your acupuncture provider treats it. In general: Your skin will  be cleaned with a germ-killing (antiseptic) solution. Your acupuncture practitioner will open a new set of germ-free (sterile) needles. The needles will be gently inserted into your skin. They will be left in place for a certain amount of time. You may feel slight pain or a tingling sensation. Your acupuncture practitioner may: Apply electrical energy to the needles. Adjust the needles in certain ways. After your procedure, the acupuncture practitioner will remove the needles, throw them away, and clean your skin. The procedure may vary among health care providers. What can I expect after the treatment? People react differently to acupuncture. Make sure you ask your acupuncture provider what to expect after your treatment. It is common to have: Minor bruising. Mild pain. A small amount of bleeding. Follow these instructions at home: Follow any instructions given by your provider after the treatment. Keep all follow-up visits as told by your health care provider. This is important. Contact a health care provider if: You have questions about your reaction to the treatment. You have soreness. You have skin irritation or redness. You have a fever. Summary Acupuncture is a type of treatment that involves stimulating specific points on your body by inserting thin needles through your skin. This treatment is often used to treat pain, but it may also be used to help relieve other types of symptoms. The exact procedure will depend on your condition and how your acupuncture provider treats it. This information is not intended to replace advice given to you by your health care provider. Make sure you discuss any questions you have with your health care provider. Document Revised: 06/08/2021 Document Reviewed: 12/01/2020 Elsevier Patient Education  2022 [employer].

## 2021-11-21 NOTE — Progress Notes (Signed)
Acupuncture treatment #3 Last treatment 09/26/2021 Indication right hand paresthesias and wrist pain recurrent after increasing usage, caring for father with dementia Needles placed at Th4-Th5 T8  as well as MH 6 and MH 7 electrical stimulation at 100Hz  between [place] 8 and MH6 as well as between Th5 and MH 7 Patient tolerated procedure well Post procedure instructions given Total treatment time 25 minutes  Pt felt prior treatments with high freq stim were more effective for pain relief.  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  Pt states she does not wish to consider surgery  Has 2 more acupuncture treatments scheduled this year

## 2021-12-01 DIAGNOSIS — L989 Disorder of the skin and subcutaneous tissue, unspecified: Secondary | ICD-10-CM | POA: Diagnosis not present

## 2021-12-01 DIAGNOSIS — E782 Mixed hyperlipidemia: Secondary | ICD-10-CM | POA: Diagnosis not present

## 2021-12-01 DIAGNOSIS — Z9884 Bariatric surgery status: Secondary | ICD-10-CM | POA: Diagnosis not present

## 2021-12-01 DIAGNOSIS — E1129 Type 2 diabetes mellitus with other diabetic kidney complication: Secondary | ICD-10-CM | POA: Diagnosis not present

## 2021-12-01 DIAGNOSIS — G8929 Other chronic pain: Secondary | ICD-10-CM | POA: Diagnosis not present

## 2021-12-05 ENCOUNTER — Other Ambulatory Visit: Payer: Self-pay

## 2021-12-05 ENCOUNTER — Encounter: Payer: Medicare HMO | Attending: Physical Medicine & Rehabilitation | Admitting: Physical Medicine & Rehabilitation

## 2021-12-05 ENCOUNTER — Encounter: Payer: Self-pay | Admitting: Physical Medicine & Rehabilitation

## 2021-12-05 VITALS — BP 132/81 | HR 87 | Temp 98.4°F | Ht 60.0 in | Wt 249.0 lb

## 2021-12-05 DIAGNOSIS — M5416 Radiculopathy, lumbar region: Secondary | ICD-10-CM | POA: Diagnosis not present

## 2021-12-05 DIAGNOSIS — M75102 Unspecified rotator cuff tear or rupture of left shoulder, not specified as traumatic: Secondary | ICD-10-CM | POA: Insufficient documentation

## 2021-12-05 DIAGNOSIS — M47816 Spondylosis without myelopathy or radiculopathy, lumbar region: Secondary | ICD-10-CM | POA: Insufficient documentation

## 2021-12-05 DIAGNOSIS — G894 Chronic pain syndrome: Secondary | ICD-10-CM | POA: Diagnosis not present

## 2021-12-05 DIAGNOSIS — G5601 Carpal tunnel syndrome, right upper limb: Secondary | ICD-10-CM | POA: Diagnosis not present

## 2021-12-05 DIAGNOSIS — M797 Fibromyalgia: Secondary | ICD-10-CM | POA: Diagnosis not present

## 2021-12-05 NOTE — Progress Notes (Signed)
Acupuncture treatment #3 Last treatment 11/21/2021 Indication right hand paresthesias and wrist pain recurrent after increasing usage, caring for father with dementia Needles placed at Th4-Th5 T8  as well as MH 6 and MH 7 electrical stimulation at 100Hz  between [place] 8 and MH6 as well as between Th5 and MH 7 Patient tolerated procedure well Post procedure instructions given Total treatment time 25 minutes

## 2021-12-05 NOTE — Patient Instructions (Signed)
Acupuncture Acupuncture is a type of treatment that involves stimulating specific points on your body by inserting thin needles through your skin. Acupuncture is often used to treat pain, but it may also be used to help relieve other types of symptoms. Your health care provider may recommend acupuncture to help treat various conditions, such as: Migraine headaches. Tension headaches. Arthritis pain. Addiction. Chronic pain. Nausea and vomiting after a surgery. High blood pressure (hypertension). Chronic obstructive pulmonary disease (COPD). Nausea caused by cancer treatment. Sudden or severe (acute) pain. Acupuncture is based on traditional [place] medicine, which recognizes more than 2,000 points on the body that connect energy pathways (meridians) through the body. The goal in stimulating these points is to balance the physical, emotional, and mental energy in your body. Acupuncture is done by a health care provider who has specialized training (licensed acupuncture practitioner). Treatment often requires several acupuncture sessions. You may have acupuncture along with other medical treatments. Tell a health care provider about: Any allergies you have. All medicines you are taking, including vitamins, herbs, eye drops, creams, and over-the-counter medicines. Any blood disorders you have. Any surgeries you have had. Any medical conditions you have. Whether you are pregnant or may be pregnant. What are the risks? Generally, this is a safe treatment. However, problems may occur, including: Skin infection. Damage to organs or structures that are under the skin where a needle is placed. What happens before the treatment? Your acupuncture practitioner will ask about your medical history and your symptoms. You may have a physical exam. What happens during the treatment? The exact procedure will depend on your condition and how your acupuncture provider treats it. In general: Your skin will  be cleaned with a germ-killing (antiseptic) solution. Your acupuncture practitioner will open a new set of germ-free (sterile) needles. The needles will be gently inserted into your skin. They will be left in place for a certain amount of time. You may feel slight pain or a tingling sensation. Your acupuncture practitioner may: Apply electrical energy to the needles. Adjust the needles in certain ways. After your procedure, the acupuncture practitioner will remove the needles, throw them away, and clean your skin. The procedure may vary among health care providers. What can I expect after the treatment? People react differently to acupuncture. Make sure you ask your acupuncture provider what to expect after your treatment. It is common to have: Minor bruising. Mild pain. A small amount of bleeding. Follow these instructions at home: Follow any instructions given by your provider after the treatment. Keep all follow-up visits as told by your health care provider. This is important. Contact a health care provider if: You have questions about your reaction to the treatment. You have soreness. You have skin irritation or redness. You have a fever. Summary Acupuncture is a type of treatment that involves stimulating specific points on your body by inserting thin needles through your skin. This treatment is often used to treat pain, but it may also be used to help relieve other types of symptoms. The exact procedure will depend on your condition and how your acupuncture provider treats it. This information is not intended to replace advice given to you by your health care provider. Make sure you discuss any questions you have with your health care provider. Document Revised: 06/08/2021 Document Reviewed: 12/01/2020 Elsevier Patient Education  2022 [employer].

## 2021-12-13 ENCOUNTER — Encounter: Payer: Medicare HMO | Admitting: Physical Medicine & Rehabilitation

## 2021-12-13 ENCOUNTER — Encounter: Payer: Self-pay | Admitting: Physical Medicine & Rehabilitation

## 2021-12-13 ENCOUNTER — Other Ambulatory Visit: Payer: Self-pay

## 2021-12-13 VITALS — BP 144/83 | HR 74 | Temp 99.1°F | Ht 60.0 in | Wt 258.0 lb

## 2021-12-13 DIAGNOSIS — G894 Chronic pain syndrome: Secondary | ICD-10-CM

## 2021-12-13 DIAGNOSIS — G5601 Carpal tunnel syndrome, right upper limb: Secondary | ICD-10-CM | POA: Diagnosis not present

## 2021-12-13 DIAGNOSIS — M75102 Unspecified rotator cuff tear or rupture of left shoulder, not specified as traumatic: Secondary | ICD-10-CM

## 2021-12-13 DIAGNOSIS — M47816 Spondylosis without myelopathy or radiculopathy, lumbar region: Secondary | ICD-10-CM

## 2021-12-13 DIAGNOSIS — M797 Fibromyalgia: Secondary | ICD-10-CM | POA: Diagnosis not present

## 2021-12-13 DIAGNOSIS — M5416 Radiculopathy, lumbar region: Secondary | ICD-10-CM

## 2021-12-13 MED ORDER — GABAPENTIN 600 MG PO TABS: 600.0000 mg | ORAL_TABLET | Freq: Four times a day (QID) | ORAL | 2 refills | Status: DC

## 2021-12-13 MED ORDER — BACLOFEN 10 MG PO TABS: 10.0000 mg | ORAL_TABLET | Freq: Four times a day (QID) | ORAL | 3 refills | Status: DC

## 2021-12-13 NOTE — Patient Instructions (Signed)
PLEASE FEEL FREE TO CALL OUR OFFICE WITH ANY PROBLEMS OR QUESTIONS ([phone])      

## 2021-12-13 NOTE — Progress Notes (Signed)
Subjective:    Patient ID: Sara Tran, female    DOB: [birth year], 65 y.o.   MRN: [id number]  HPI Sara Tran is here in follow-up of her chronic pain syndrome.  She continues to see Dr. Letta Pate for acupuncture treatments.  She completed the third of the recent series of treatments on December 6.   She feels that her right hand is worsening however. She is wearing her splint at night but not during the day.   She is complaining of increased low back pain radiating from her waist line to her right hip and leg/toes. She has symptoms on the left side also.   Tamarah also is complaining of neck pain as well as pain in her left shoulder  She has been caring for her father for the past year which has been stressful physically and emotionally.  There have been other components to her stress with her family related as well.  She does have some Xanax that she uses occasionally.  Walking helps her relieve stress, but she has not been moving as much as she had before.   She has been doubling up on her baclofen due to hamstring tightness. She also increase gabapentin on her own to q6 hours.   Jatara reports that she is being followed by medicine regarding fluid/congestive heart failure.  Pain Inventory Average Pain 8 Pain Right Now 9 My pain is intermittent, sharp, and stabbing  In the last 24 hours, has pain interfered with the following? General activity 8 Relation with others 8 Enjoyment of life 0 What TIME of day is your pain at its worst? morning  Sleep (in general) Fair  Pain is worse with: inactivity Pain improves with: rest, heat/ice, therapy/exercise, pacing activities, medication, TENS, and injections Relief from Meds: 8  Family History  Problem Relation Age of Onset   Diabetes Mother    Heart failure Mother    Breast cancer Sister    CAD Father    Hypertension Father    Heart failure Father    Cancer Paternal Grandfather    Hypertension Paternal Grandmother    Stroke  Paternal Grandmother    Other Maternal Grandmother        tonsilitis   Diabetes Maternal Grandfather    Hypertension Maternal Grandfather    Breast cancer Paternal Aunt    Social History   Socioeconomic History   Marital status: Single    Spouse name: Not on file   Number of children: 0   Years of education: Not on file   Highest education level: Not on file  Occupational History   Occupation: Volunteers   Occupation: Midwife    Comment: Retired from [employer] [facility] in [place] Use   Smoking status: Former    Types: Cigarettes    Quit date: 08/08/1996    Years since quitting: 25.3   Smokeless tobacco: Never  Vaping Use   Vaping Use: Never used  Substance and Sexual Activity   Alcohol use: No   Drug use: No   Sexual activity: Yes    Birth control/protection: Post-menopausal  Other Topics Concern   Not on file  Social History Narrative   Not on file   Social Determinants of Health   Financial Resource Strain: Not on file  Food Insecurity: No Food Insecurity   Worried About Running Out of Food in the Last Year: Never true   [place] in the Last Year: Never true  Transportation Needs: No Transportation  Needs   Lack of Transportation (Medical): No   Lack of Transportation (Non-Medical): No  Physical Activity: Not on file  Stress: Not on file  Social Connections: Not on file   Past Surgical History:  Procedure Laterality Date   accupuncture     APPENDECTOMY     CARDIAC CATHETERIZATION  07/2007, 05/2012   Normal coronary arteries   COLONOSCOPY  10/18/2010   SLF:2-mm sessile sigmoid polyp/diverticula, tortuous colon.  Biopsies of sigmoid colon polyp and random colon biopsies benign   COLONOSCOPY WITH PROPOFOL N/A 08/09/2020   Procedure: COLONOSCOPY WITH PROPOFOL;  Surgeon: Eloise Harman, DO;  [facility];  Service: Endoscopy;  Laterality: N/A;  11:00am   CYSTOSCOPY/URETEROSCOPY/HOLMIUM LASER/STENT PLACEMENT Right 08/01/2017   Procedure:  CYSTOSCOPY/ RIGHT URETEROSCOPY/ RIGHT RETROGRADE/ HOLMIUM LASER  AND RIGHT STENT PLACEMENT FIRST STAGE;  Surgeon: Irine Seal, MD;  Location: [facility];  Service: Urology;  Laterality: Right;   CYSTOSCOPY/URETEROSCOPY/HOLMIUM LASER/STENT PLACEMENT Right 08/08/2017   Procedure: RIGHT URETEROSCOPY WITH HOLMIUM LASER RIGHT STENT PLACEMENT;  Surgeon: Irine Seal, MD;  Location: [facility];  Service: Urology;  Laterality: Right;   EXTRACORPOREAL SHOCK WAVE LITHOTRIPSY Right 08/22/2017   Procedure: RIGHT EXTRACORPOREAL SHOCK WAVE LITHOTRIPSY (ESWL);  Surgeon: Alexis Frock, MD;  Location: [facility];  Service: Urology;  Laterality: Right;   HEMORRHOID BANDING  07/2012   Dr. Oneida Alar   LAPAROSCOPIC CHOLECYSTECTOMY  1985   LAPAROSCOPIC GASTRIC BANDING  12/12/2010   LEFT AND RIGHT HEART CATHETERIZATION WITH CORONARY ANGIOGRAM N/A 06/03/2012   Procedure: LEFT AND RIGHT HEART CATHETERIZATION WITH CORONARY ANGIOGRAM;  Surgeon: Jolaine Artist, MD;  Location[facility] C[facility] LAB;  Service: Cardiovascular;  Laterality: N/A;   PILONIDAL CYST EXCISION  1974   RIGHT/LEFT HEART CATH AND CORONARY ANGIOGRAPHY N/A 03/28/2020   Procedure: RIGHT/LEFT HEART CATH AND CORONARY ANGIOGRAPHY;  Surgeon: Jolaine Artist, MD;  Location: [facility];  Service: Cardiovascular;  Laterality: N/A;   TUMOR EXCISION  10/2011   Left thigh   TUMOR EXCISION  10/2011   Face   Past Surgical History:  Procedure Laterality Date   accupuncture     APPENDECTOMY     CARDIAC CATHETERIZATION  07/2007, 05/2012   Normal coronary arteries   COLONOSCOPY  10/18/2010   SLF:2-mm sessile sigmoid polyp/diverticula, tortuous colon.  Biopsies of sigmoid colon polyp and random colon biopsies benign   COLONOSCOPY WITH PROPOFOL N/A 08/09/2020   Procedure: COLONOSCOPY WITH PROPOFOL;  Surgeon: Eloise Harman, DO;  [facility];  Service: Endoscopy;  Laterality: N/A;  11:00am   CYSTOSCOPY/URETEROSCOPY/HOLMIUM LASER/STENT PLACEMENT Right 08/01/2017    Procedure: CYSTOSCOPY/ RIGHT URETEROSCOPY/ RIGHT RETROGRADE/ HOLMIUM LASER  AND RIGHT STENT PLACEMENT FIRST STAGE;  Surgeon: Irine Seal, MD;  [facility];  Service: Urology;  Laterality: Right;   CYSTOSCOPY/URETEROSCOPY/HOLMIUM LASER/STENT PLACEMENT Right 08/08/2017   Procedure: RIGHT URETEROSCOPY WITH HOLMIUM LASER RIGHT STENT PLACEMENT;  Surgeon: Irine Seal, MD;  [facility];  Service: Urology;  Laterality: Right;   EXTRACORPOREAL SHOCK WAVE LITHOTRIPSY Right 08/22/2017   Procedure: RIGHT EXTRACORPOREAL SHOCK WAVE LITHOTRIPSY (ESWL);  Surgeon: Alexis Frock, MD;  [facility];  Service: Urology;  Laterality: Right;   HEMORRHOID BANDING  07/2012   Dr. Oneida Alar   LAPAROSCOPIC CHOLECYSTECTOMY  1985   LAPAROSCOPIC GASTRIC BANDING  12/12/2010   LEFT AND RIGHT HEART CATHETERIZATION WITH CORONARY ANGIOGRAM N/A 06/03/2012   Procedure: LEFT AND RIGHT HEART CATHETERIZATION WITH CORONARY ANGIOGRAM;  Surgeon: Jolaine Artist, MD;  Location: [facility] CATH LAB;  Service: Cardiovascular;  Laterality: N/A;   PILONIDAL CYST EXCISION  1974   RIGHT/LEFT HEART CATH AND CORONARY ANGIOGRAPHY N/A 03/28/2020   Procedure: RIGHT/LEFT HEART CATH AND CORONARY ANGIOGRAPHY;  Surgeon: Jolaine Artist, MD;  Location: [place] [facility];  Service: Cardiovascular;  Laterality: N/A;   TUMOR EXCISION  10/2011   Left thigh   TUMOR EXCISION  10/2011   Face   Past Medical History:  Diagnosis Date   Adnexal cyst    Right simple cyst seen on [place]   Anginal pain (HCC)    Anxiety    Arthritis    Asthma    Atrial fibrillation (HCC)    hx of    CHF (congestive heart failure) (HCC)    Chronic back pain    Chronic hip pain    COPD (chronic obstructive pulmonary disease) (HCC)    Coronary artery disease    stents placed    Dyspnea    GERD (gastroesophageal reflux disease)    Headache    due to pinched nerve damaage    Heart murmur    hx of slight murmur    History of bronchitis    History of cardiac  catheterization    Normal coronaries 2013   History of kidney stones    Hot flashes    Hypertension    Hypertriglyceridemia    IBS (irritable bowel syndrome)    Internal hemorrhoids    Morbid obesity ([place])    s/p lap band surgery   Myocardial infarction ([place]) 12/2018   times 3   Obesity    Pulmonary HTN ([place])    [place] 2008:  RA pressures 13/10 with a mean of 7 mmHg.  RV pressure 39/76 with end diastolic pressure of 15 mmHg.  PA pressure 49/23 with a mean of 35 mmHg.  Pulmonary capillary wedge 17/50 with a mean of 14 mmHg. The PA saturation was 68%.  RA saturation was 71% and aortic saturation was 90%.  Cardiac output was 6.0 with a cardiac index of 2.80 by Fick.  Normal coronaries by cath 2008.   Renal insufficiency    Sciatica of right side    Sleep apnea    CPAP use does not know settings    Trauma    Type 2 diabetes mellitus (HCC)    type II    Uterine fibroid    Ht 5' (1.524 m)    Wt 258 lb (117 kg)    LMP 06/06/2013    BMI 50.39 kg/m   Opioid Risk Score:   Fall Risk Score:  `1  Depression screen PHQ 2/9  Depression screen [facility] 2/9 12/05/2021 09/26/2021 03/20/2021 12/08/2020 10/14/2020 09/23/2020 09/16/2020  Decreased Interest 0 0 0 0 0 0 0  Down, Depressed, Hopeless 0 0 0 0 0 0 0  PHQ - 2 Score 0 0 0 0 0 0 0  Altered sleeping - - - - - - -  Tired, decreased energy - - - - - - -  Change in appetite - - - - - - -  Feeling bad or failure about yourself  - - - - - - -  Trouble concentrating - - - - - - -  Moving slowly or fidgety/restless - - - - - - -  Suicidal thoughts - - - - - - -  PHQ-9 Score - - - - - - -  Difficult doing work/chores - - - - - - -  Some recent data might be hidden  Review of Systems  Constitutional: Negative.   HENT: Negative.    Eyes: Negative.   Respiratory: Negative.    Cardiovascular: Negative.   Gastrointestinal: Negative.   Endocrine: Negative.   Genitourinary:  Positive for difficulty urinating and frequency.  Musculoskeletal:   Positive for back pain, gait problem, neck pain and neck stiffness.  Skin: Negative.   Allergic/Immunologic: Negative.   Neurological:  Positive for dizziness, weakness and numbness.  Hematological: Negative.   Psychiatric/Behavioral: Negative.        Objective:   Physical Exam General: No acute distress.  Obese HEENT: EOMI, oral membranes moist Cards: reg rate  Chest: normal effort Abdomen: Soft, NT, ND Skin: dry, intact Extremities: no edema Psych: Anxious and distracted.  She is a hard time focusing on one topic at a time.   Neuro: Pt is cognitively appropriate with normal insight, memory, and awareness. Cranial nerves 2-12 are intact. Sensory exam is normal. Reflexes are 2+ in all 4's. Fine motor coordination is intact. No tremors. Motor function is grossly 5/5. Musculoskeletal: Patient demonstrates limited lumbar flexion and extension.  She has a hard time coming to neutral in standing.  Has pain with straight leg maneuvers on either side.  Both greater trochanters are tender right more than left.  Left shoulder is tender along the subacromial/subdeltoid bursa's.  She has pain with external rotation more than internal rotation.  Impingement maneuvers positive on the left.  Posture is fair.  Assessment & Plan:  1.  Chronic low back pain with known facet arthropathy and L4-L5 anterolisthesis.  Has had some intermittent radicular signs as well.  Known dextroscoliosis and maladaptive pelvic positioning also. 2.  Bilateral greater trochanteric bursitis with iliotibial band syndrome right greater than left 3.  Morbid obesity.   4.  Chronic pain/ fibromyalgia syndrome 5.  Cervicalgia 6. Increased anxiety/?depression 7. Right wrist pain, moderate CTS by [facility] 02/02/20 (Dr. Ernestina Patches) 8. ?FMS   Plan: 1.  Increase Gabapentin to 600mg  qid 2.  made referral to [facility] outpt PT and OT to address low back and left shoulder 3. oxycodone per primary.   4. Baclofen. Increase to 10-20mg  qid #150 5.  continue with bowel regimen 6. Continue with Dr. Letta Pate for further acupuncture which she found helpful for RUE pain.  -consider ncs, emg in new year  -needs to wear splint more 7.  Discussed with the patient the importance of managing her stress day and anxiety. It's obvious this is out of contorl currently. Would benefit from counseling potentially. Discussed trial of cymbalta with patient. She wants to try to work thru stresses on her own for now.     15 minutes of face to face patient care time were spent during this visit. All questions were encouraged and answered.  Follow up with me in 6 mos, Dr. Raliegh Ip sooner.

## 2021-12-19 ENCOUNTER — Encounter: Payer: Medicare HMO | Admitting: Physical Medicine & Rehabilitation

## 2022-01-09 ENCOUNTER — Other Ambulatory Visit: Payer: Self-pay

## 2022-01-09 NOTE — Patient Outreach (Signed)
[facility]) Care Management  01/09/2022  Sara Tran [birth year] [id number]   Care Coordination   Message received from patient requesting a return call. Call placed to patient. She reports that she is in the process of completing paperwork for med assistance. She is aware that she needs to take to MD office for PCP to complete as well. Advised patient that PCP office also has [facility] pharmacist available to assist as needed. She voiced understanding.      Plan: RN CM will outreach patient as previously scheduled.   Enzo Montgomery, RN,BSN,CCM [place] Management Telephonic Care Management Coordinator Direct Phone: [phone] Toll Free: [phone] Fax: [phone]

## 2022-01-10 ENCOUNTER — Other Ambulatory Visit: Payer: Self-pay

## 2022-01-10 NOTE — Patient Outreach (Signed)
[facility]) Care Management  01/10/2022  Sara Tran November 23, 1956 [id number]   Case Closure   Patient's PCP office to provide CM services via [facility]. Patient to be enrolled in their services and will receive ongoing CM services.    Plan: RN CM will close case at this time.    Enzo Montgomery, RN,BSN,CCM [place] Management Telephonic Care Management Coordinator Direct Phone: [phone] Toll Free: [phone] Fax: [phone]

## 2022-01-12 ENCOUNTER — Ambulatory Visit (HOSPITAL_COMMUNITY): Payer: Medicare HMO | Attending: Physical Medicine & Rehabilitation

## 2022-01-12 ENCOUNTER — Other Ambulatory Visit: Payer: Self-pay

## 2022-01-12 ENCOUNTER — Encounter (HOSPITAL_COMMUNITY): Payer: Self-pay

## 2022-01-12 DIAGNOSIS — R2689 Other abnormalities of gait and mobility: Secondary | ICD-10-CM

## 2022-01-12 DIAGNOSIS — R29898 Other symptoms and signs involving the musculoskeletal system: Secondary | ICD-10-CM | POA: Insufficient documentation

## 2022-01-12 DIAGNOSIS — M47816 Spondylosis without myelopathy or radiculopathy, lumbar region: Secondary | ICD-10-CM | POA: Diagnosis not present

## 2022-01-12 DIAGNOSIS — M797 Fibromyalgia: Secondary | ICD-10-CM | POA: Insufficient documentation

## 2022-01-12 DIAGNOSIS — M25612 Stiffness of left shoulder, not elsewhere classified: Secondary | ICD-10-CM | POA: Insufficient documentation

## 2022-01-12 DIAGNOSIS — M545 Low back pain, unspecified: Secondary | ICD-10-CM

## 2022-01-12 DIAGNOSIS — M25512 Pain in left shoulder: Secondary | ICD-10-CM | POA: Insufficient documentation

## 2022-01-12 DIAGNOSIS — M6281 Muscle weakness (generalized): Secondary | ICD-10-CM
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
## 2022-01-17 ENCOUNTER — Other Ambulatory Visit: Payer: Self-pay

## 2022-01-17 ENCOUNTER — Encounter (HOSPITAL_COMMUNITY): Payer: Self-pay | Admitting: Physical Therapy

## 2022-01-17 ENCOUNTER — Encounter (HOSPITAL_COMMUNITY): Payer: Self-pay

## 2022-01-17 ENCOUNTER — Ambulatory Visit (HOSPITAL_COMMUNITY): Payer: Medicare HMO | Admitting: Physical Therapy

## 2022-01-17 ENCOUNTER — Ambulatory Visit (HOSPITAL_COMMUNITY): Payer: Medicare HMO

## 2022-01-17 DIAGNOSIS — M25612 Stiffness of left shoulder, not elsewhere classified: Secondary | ICD-10-CM

## 2022-01-17 DIAGNOSIS — R29898 Other symptoms and signs involving the musculoskeletal system: Secondary | ICD-10-CM

## 2022-01-17 DIAGNOSIS — M545 Low back pain, unspecified: Secondary | ICD-10-CM

## 2022-01-17 DIAGNOSIS — R2689 Other abnormalities of gait and mobility: Secondary | ICD-10-CM | POA: Diagnosis not present

## 2022-01-17 DIAGNOSIS — M6281 Muscle weakness (generalized): Secondary | ICD-10-CM

## 2022-01-17 DIAGNOSIS — M25512 Pain in left shoulder: Secondary | ICD-10-CM | POA: Diagnosis not present

## 2022-01-17 DIAGNOSIS — M797 Fibromyalgia: Secondary | ICD-10-CM | POA: Diagnosis not present

## 2022-01-17 DIAGNOSIS — M47816 Spondylosis without myelopathy or radiculopathy, lumbar region: Secondary | ICD-10-CM | POA: Diagnosis not present

## 2022-01-17 NOTE — Therapy (Signed)
[place], [place], [address] Phone: [phone]   Fax:  [phone]  Occupational Therapy Evaluation  Patient Details  Name: Sara Tran MRN: [id number] Date of Birth: [birth year] Referring Provider (OT): Alger Simons, MD   Encounter Date: 01/17/2022   OT End of Session - 01/17/22 1604     Visit Number 1    Number of Visits 12    Date for OT Re-Evaluation 02/28/22    Authorization Type [employer]    Authorization Time Period requesting 12 visits on 01/17/22    Authorization - Visit Number 0    Authorization - Number of Visits 12    Progress Note Due on Visit 10    OT Start Time 1455    OT Stop Time 1539    OT Time Calculation (min) 44 min    Activity Tolerance Patient tolerated treatment well;Patient limited by pain    Behavior During Therapy [facility] for tasks assessed/performed             Past Medical History:  Diagnosis Date   Adnexal cyst    Right simple cyst seen on [place]   Anginal pain (HCC)    Anxiety    Arthritis    Asthma    Atrial fibrillation (HCC)    hx of    CHF (congestive heart failure) (HCC)    Chronic back pain    Chronic hip pain    COPD (chronic obstructive pulmonary disease) ([place])    Coronary artery disease    stents placed    Dyspnea    GERD (gastroesophageal reflux disease)    Headache    due to pinched nerve damaage    Heart murmur    hx of slight murmur    History of bronchitis    History of cardiac catheterization    Normal coronaries 2013   History of kidney stones    Hot flashes    Hypertension    Hypertriglyceridemia    IBS (irritable bowel syndrome)    Internal hemorrhoids    Morbid obesity ([place])    s/p lap band surgery   Myocardial infarction ([place]) 12/2018   times 3   Obesity    Pulmonary HTN ([place])    [place] 2008:  RA pressures 13/10 with a mean of 7 mmHg.  RV pressure 31/49 with end diastolic pressure of 15 mmHg.  PA pressure 49/23 with a mean of 35  mmHg.  Pulmonary capillary wedge 17/50 with a mean of 14 mmHg. The PA saturation was 68%.  RA saturation was 71% and aortic saturation was 90%.  Cardiac output was 6.0 with a cardiac index of 2.80 by [place]ick.  Normal coronaries by cath 2008.   Renal insufficiency    Sciatica of right side    Sleep apnea    CPAP use does not know settings    Trauma    Type 2 diabetes mellitus ([place])    type II    Uterine fibroid     Past Surgical History:  Procedure Laterality Date   accupuncture     APPENDECTOMY     CARDIAC CATHETERIZATION  07/2007, 05/2012   Normal coronary arteries   COLONOSCOPY  10/18/2010   SLF:2-mm sessile sigmoid polyp/diverticula, tortuous colon.  Biopsies of sigmoid colon polyp and random colon biopsies benign   COLONOSCOPY WITH PROPOFOL N/A 08/09/2020   Procedure: COLONOSCOPY WITH PROPOFOL;  Surgeon: Eloise Harman, DO;  [facility]: [facility];  Service: Endoscopy;  Laterality: N/A;  11:00am   CYSTOSCOPY/URETEROSCOPY/HOLMIUM LASER/STENT PLACEMENT Right 08/01/2017   Procedure: CYSTOSCOPY/ RIGHT URETEROSCOPY/ RIGHT RETROGRADE/ HOLMIUM LASER  AND RIGHT STENT PLACEMENT FIRST STAGE;  Surgeon: Irine Seal, MD;  Location: [facility];  Service: Urology;  Laterality: Right;   CYSTOSCOPY/URETEROSCOPY/HOLMIUM LASER/STENT PLACEMENT Right 08/08/2017   Procedure: RIGHT URETEROSCOPY WITH HOLMIUM LASER RIGHT STENT PLACEMENT;  Surgeon: Irine Seal, MD;  Location: [facility];  Service: Urology;  Laterality: Right;   EXTRACORPOREAL SHOCK WAVE LITHOTRIPSY Right 08/22/2017   Procedure: RIGHT EXTRACORPOREAL SHOCK WAVE LITHOTRIPSY (ESWL);  Surgeon: Alexis Frock, MD;  Location: [facility];  Service: Urology;  Laterality: Right;   HEMORRHOID BANDING  07/2012   Dr. Oneida Alar   LAPAROSCOPIC CHOLECYSTECTOMY  1985   LAPAROSCOPIC GASTRIC BANDING  12/12/2010   LEFT AND RIGHT HEART CATHETERIZATION WITH CORONARY ANGIOGRAM N/A 06/03/2012   Procedure: LEFT AND RIGHT HEART CATHETERIZATION WITH CORONARY ANGIOGRAM;  Surgeon: Jolaine Artist, MD;  Location: [facility] CATH LAB;  Service: Cardiovascular;  Laterality: N/A;   PILONIDAL CYST EXCISION  1974   RIGHT/LEFT HEART CATH AND CORONARY ANGIOGRAPHY N/A 03/28/2020   Procedure: RIGHT/LEFT HEART CATH AND CORONARY ANGIOGRAPHY;  Surgeon: Jolaine Artist, MD;  Location: [place] CV LAB;  Service: Cardiovascular;  Laterality: N/A;   TUMOR EXCISION  10/2011   Left thigh   TUMOR EXCISION  10/2011   Face    There were no vitals filed for this visit.   Subjective Assessment - 01/17/22 1501     Subjective  S: It's been hurting for a couple of months. The right shoulder is worse.    Pertinent History Patient is a 66 y/o female S/P left shoulder pain and bursitis which started approximately a few months ago. There is no definite cause for sudden onset of pain. Pt reports chronic right shoulder pain as well with a RTC tear. She is currently attending PT for low back pain. Dr. Naaman Plummer has referred patient to occupational therapy for evaluation and treatment.    Patient Stated Goals To be able to use her left arm better with smoother motor movements and less pain.    Currently in Pain? Yes    Pain Score 9     Pain Location Shoulder    Pain Orientation Right    Pain Descriptors / Indicators Shooting    Pain Type Acute pain    Pain Radiating Towards shoulder to elbow    Pain Onset More than a month ago    Pain Frequency Intermittent    Aggravating Factors  lifting, increased activity, sweeping, mopping    Pain Relieving Factors PT exercises, prescribed pain medication (sometimes), resting    Effect of Pain on Daily Activities severe efffect    Multiple Pain Sites No               [facility] OT Assessment - 01/17/22 1504       Assessment   Medical Diagnosis Left shoulder pain/bursitis    Referring Provider (OT) Alger Simons, MD    Onset Date/Surgical Date --   approximately a few months ago   Hand Dominance Right    Next MD Visit 03/21/22    Prior Therapy Pt is currently  receiving PT services at this clinic for low back pain.      Precautions   Precautions None    Precaution Comments history right shoulder RTC tear, decreased sensation in bilateral hand finger tips.      Restrictions   Weight Bearing Restrictions No  Balance Screen   Has the patient fallen in the past 6 months Yes    How many times? 1      Anna expects to be discharged to: Private residence    Living Arrangements Alone      Prior Function   Level of [place] On disability      ADL   ADL comments Pt reports difficulty with all daily tasks. It comes and goes. reaching up and out to the side. dressing tasks (ie pants).      Mobility   Mobility Status Independent      Written Expression   Dominant Hand Right      Vision - History   Baseline Vision No visual deficits      Cognition   Overall Cognitive Status Within Functional Limits for tasks assessed      Observation/Other Assessments   Focus on Therapeutic Outcomes (FOTO)  45/100      Posture/Postural Control   Posture/Postural Control Postural limitations    Postural Limitations Rounded Shoulders;Forward head;Flexed trunk      Sensation   Additional Comments reports decreased sensation in fingertips of bilateral hands. History of nueropathy.      ROM / Strength   AROM / PROM / Strength AROM;PROM;Strength      Palpation   Palpation comment max fascial restrictions noted in the left upper trapezius, and upper arm around [facility] joint.      AROM   Overall AROM Comments Assessed seated. IR/er abducted    AROM Assessment Site Shoulder    Right/Left Shoulder Left    Left Shoulder Flexion 155 Degrees    Left Shoulder ABduction 150 Degrees    Left Shoulder Internal Rotation 30 Degrees    Left Shoulder External Rotation 40 Degrees      Strength   Overall Strength Comments Assessed seated IR/er adducted. Muscle strength initially functional then quickly gave.     Strength Assessment Site Shoulder    Right/Left Shoulder Left    Left Shoulder Flexion 4/5    Left Shoulder ABduction 4/5    Left Shoulder Internal Rotation 4-/5    Left Shoulder External Rotation 4-/5                              OT Education - 01/17/22 1549     Education Details shoulder stretches and scapular A/ROM    Person(s) Educated Patient    Methods Explanation;Demonstration;Tactile cues;Handout;Verbal cues    Comprehension Returned demonstration;Verbalized understanding              OT Short Term Goals - 01/17/22 1608       OT SHORT TERM GOAL #1   Title Patient will be educated and independent with HEP in order to increase functional use of her LUE and be able to use it for all required self care tasks such as dressing with less difficulty.    Time 6    Period Weeks    Status New    Target Date 02/28/22      OT SHORT TERM GOAL #2   Title Patient will increase her LUE A/ROM (IR/er) to [facility] in order to complete lower body dressing tasks (ie. manipulating pants) with decreased difficulty.    Time 6    Period Weeks    Status New      OT SHORT TERM GOAL #3   Title Patient will  increase her LUE strength to 4+/5 with greater scapular stability and decreased muscle breaking while being tested.    Time 6    Period Weeks    Status New      OT SHORT TERM GOAL #4   Title Patient will report a decreased pain level of approximately 5/10 or less when utilizing her left arm to complete functional reaching tasks at or above her shoulder level.    Time 6    Period Weeks    Status New      OT SHORT TERM GOAL #5   Title Pt will decrease her LUE fascial restrictions to a minimal amount or less in order to increase the functional mobility needed to complete reaching tasks.    Time 6    Period Weeks    Status New                      Plan - 01/17/22 1605     Clinical Impression Statement A: Patient is a 66 y/o female S/P left shoulder  pain and bursitis causing increased fascial restrictions, decreased ROM and strength resulting in difficulty completing basic ADL tasks with her left arm.    OT Occupational Profile and History Problem Focused Assessment - Including review of records relating to presenting problem    Occupational performance deficits (Please refer to evaluation for details): ADL's;IADL's;Rest and Sleep;Leisure    Body Structure / Function / Physical Skills ADL;UE functional use;Fascial restriction;Pain;ROM;IADL;Strength    Rehab Potential Good    Clinical Decision Making Several treatment options, min-mod task modification necessary    Comorbidities Affecting Occupational Performance: Presence of comorbidities impacting occupational performance    Comorbidities impacting occupational performance description: see medical chart for history    Modification or Assistance to Complete Evaluation  Min-Moderate modification of tasks or assist with assess necessary to complete eval    OT Frequency 2x / week    OT Duration 6 weeks    OT Treatment/Interventions Self-care/ADL training;Ultrasound;Patient/family education;DME and/or AE instruction;Passive range of motion;Cryotherapy;Electrical Stimulation;Therapeutic activities;Manual Therapy;Therapeutic exercise;Moist Heat;Neuromuscular education    Plan P: Patient will benefit from skilled OT services to increase functional use of her LUE when completing basic ADL tasks. Treatment plan: Myofascial release, passive stretching, A/ROM, scapular strengthening. modalities PRN.    OT Home Exercise Plan eval: shoulder stretches, A/ROM scapular row    Consulted and Agree with Plan of Care Patient             Patient will benefit from skilled therapeutic intervention in order to improve the following deficits and impairments:   Body Structure / Function / Physical Skills: ADL, UE functional use, Fascial restriction, Pain, ROM, IADL, Strength       Visit Diagnosis: Acute pain  of left shoulder  Stiffness of left shoulder, not elsewhere classified  Other symptoms and signs involving the musculoskeletal system    Problem List Patient Active Problem List   Diagnosis Date Noted   Rotator cuff syndrome, left 12/13/2021   Asthma, persistent controlled 07/24/2021   Right carpal tunnel syndrome 06/14/2021   DOE (dyspnea on exertion) 12/13/2020   Pulmonary hypertension due to sleep-disordered breathing (HCC) 12/13/2020   Abdominal bloating 09/22/2020   Constipation 07/06/2020   Fibromyalgia 04/20/2020   Routine cervical smear 04/18/2020   Encounter for gynecological examination with Papanicolaou smear of cervix 04/18/2020   Encounter for colorectal cancer screening 04/18/2020   Chronic female pelvic pain 04/18/2020   Proteinuria 04/18/2020   Bad odor of  urine 04/18/2020   Klebsiella pneumonia ([place]) 01/30/2019   E. coli UTI (urinary tract infection) 03/83/3383   Complicated UTI (urinary tract infection) 01/29/2019   Levoscoliosis 11/20/2017   [place] kidney stones 08/08/2017   GI bleed 05/29/2017   Sepsis due to urinary tract infection ([place]) 05/29/2017   UTI (urinary tract infection) 05/29/2017   Cervicalgia 11/21/2016   Chronic pain syndrome 10/11/2015   Lumbar radiculopathy 07/25/2015   Fibroid 10/13/2013   Adnexal cyst 10/13/2013   PMB (postmenopausal bleeding) 10/06/2013   Unspecified symptom associated with female genital organs 10/06/2013   Hot flashes 10/06/2013   Internal hemorrhoids with other complication 29/19/1660   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   Hip pain 04/09/2013   Hemorrhoids, internal 03/12/2013   Back pain 03/11/2013   Encounter for therapeutic drug monitoring 03/11/2013   Trochanteric bursitis 03/11/2013   Lumbar facet arthropathy 03/11/2013   Unstable angina ([place]) 06/02/2012   Acute on chronic renal insufficiency 06/02/2012   Chest pain 04/22/2012   IBS (irritable bowel syndrome)-DIARRHEA 01/23/2012   Morbid  obesity ([place]) 08/09/2011   Diabetes mellitus ([place]) 08/09/2011   DIARRHEA 10/11/2010   Chronic diastolic heart failure ([place]) 12/16/2007   Pulmonary hypertension ([place]) 12/12/2007   OSTEOARTHRITIS 12/11/2007   OSA on CPAP 12/11/2007   Ailene Ravel, OTR/L,CBIS  [phone]  01/17/2022, 4:13 PM  [place] [address] [place], [place], [address] Phone: [phone]   Fax:  [phone]  Name: Sara Tran MRN: [id number] Date of Birth: 1956-05-22

## 2022-01-17 NOTE — Patient Instructions (Signed)
Complete the following exercises 2-3 times a day.    Scapular Retraction (Standing)   With arms at sides, pinch shoulder blades together. Repeat __10__ times per set. Do __1__ sets per session. Do __2__ sessions per day.   Posterior Capsule Stretch   Stand or sit, one arm across body so hand rests over opposite shoulder. Gently push on crossed elbow with other hand until stretch is felt in shoulder of crossed arm. Hold _10-15__ seconds.  Repeat _2__ times per session. Do ___ sessions per day.   Wall Flexion  Slide your arm up the wall or door frame until a stretch is felt in your shoulder . Hold for 10-15 seconds. Complete 2 times     Shoulder Abduction Stretch  Stand side ways by a wall with affected up on wall. Gently step in toward wall to feel stretch. Hold for 10-15 seconds. Complete 2 times.     1) Seated Row   Sit up straight with elbows by your sides. Pull back with shoulders/elbows, keeping forearms straight, as if pulling back on the reins of a horse. Squeeze shoulder blades together. Repeat _10_times,

## 2022-01-17 NOTE — Therapy (Signed)
[facility] [address] [place], [place], [address] Phone: [phone]   Fax:  [phone]  Physical Therapy Treatment  Patient Details  Name: Sara Tran MRN: [id number] Date of Birth: 11-Aug-1956 Referring Provider (PT): Meredith Staggers, MD   Encounter Date: 01/17/2022   PT End of Session - 01/17/22 1410     Visit Number 2    Number of Visits 8    Date for PT Re-Evaluation 02/09/22    Authorization Type [employer] HMO, no VL, auth form attached    Authorization Time Period 1/13 - 2/17    Authorization - Visit Number 2    Authorization - Number of Visits 8    Progress Note Due on Visit 10    PT Start Time 2706    PT Stop Time 1448    PT Time Calculation (min) 36 min    Activity Tolerance No increased pain    Behavior During Therapy WFL for tasks assessed/performed             Past Medical History:  Diagnosis Date   Adnexal cyst    Right simple cyst seen on [place]   Anginal pain (HCC)    Anxiety    Arthritis    Asthma    Atrial fibrillation (HCC)    hx of    CHF (congestive heart failure) (HCC)    Chronic back pain    Chronic hip pain    COPD (chronic obstructive pulmonary disease) ([place])    Coronary artery disease    stents placed    Dyspnea    GERD (gastroesophageal reflux disease)    Headache    due to pinched nerve damaage    Heart murmur    hx of slight murmur    History of bronchitis    History of cardiac catheterization    Normal coronaries 2013   History of kidney stones    Hot flashes    Hypertension    Hypertriglyceridemia    IBS (irritable bowel syndrome)    Internal hemorrhoids    Morbid obesity ([place])    s/p lap band surgery   Myocardial infarction ([place]) 12/2018   times 3   Obesity    Pulmonary HTN ([place])    [place] 2008:  RA pressures 13/10 with a mean of 7 mmHg.  RV pressure 23/76 with end diastolic pressure of 15 mmHg.  PA pressure 49/23 with a mean of 35 mmHg.  Pulmonary capillary wedge  17/50 with a mean of 14 mmHg. The PA saturation was 68%.  RA saturation was 71% and aortic saturation was 90%.  Cardiac output was 6.0 with a cardiac index of 2.80 by Fick.  Normal coronaries by cath 2008.   Renal insufficiency    Sciatica of right side    Sleep apnea    CPAP use does not know settings    Trauma    Type 2 diabetes mellitus ([place])    type II    Uterine fibroid     Past Surgical History:  Procedure Laterality Date   accupuncture     APPENDECTOMY     CARDIAC CATHETERIZATION  07/2007, 05/2012   Normal coronary arteries   COLONOSCOPY  10/18/2010   SLF:2-mm sessile sigmoid polyp/diverticula, tortuous colon.  Biopsies of sigmoid colon polyp and random colon biopsies benign   COLONOSCOPY WITH PROPOFOL N/A 08/09/2020   Procedure: COLONOSCOPY WITH PROPOFOL;  Surgeon: Eloise Harman, DO;  [facility];  Service: Endoscopy;  Laterality: N/A;  11:00am   CYSTOSCOPY/URETEROSCOPY/HOLMIUM LASER/STENT PLACEMENT Right 08/01/2017   Procedure: CYSTOSCOPY/ RIGHT URETEROSCOPY/ RIGHT RETROGRADE/ HOLMIUM LASER  AND RIGHT STENT PLACEMENT FIRST STAGE;  Surgeon: Irine Seal, MD;  [facility]: [facility];  Service: Urology;  Laterality: Right;   CYSTOSCOPY/URETEROSCOPY/HOLMIUM LASER/STENT PLACEMENT Right 08/08/2017   Procedure: RIGHT URETEROSCOPY WITH HOLMIUM LASER RIGHT STENT PLACEMENT;  Surgeon: Irine Seal, MD;  [facility];  Service: Urology;  Laterality: Right;   EXTRACORPOREAL SHOCK WAVE LITHOTRIPSY Right 08/22/2017   Procedure: RIGHT EXTRACORPOREAL SHOCK WAVE LITHOTRIPSY (ESWL);  Surgeon: Alexis Frock, MD;  [facility];  Service: Urology;  Laterality: Right;   HEMORRHOID BANDING  07/2012   Dr. Oneida Alar   LAPAROSCOPIC CHOLECYSTECTOMY  1985   LAPAROSCOPIC GASTRIC BANDING  12/12/2010   LEFT AND RIGHT HEART CATHETERIZATION WITH CORONARY ANGIOGRAM N/A 06/03/2012   Procedure: LEFT AND RIGHT HEART CATHETERIZATION WITH CORONARY ANGIOGRAM;  Surgeon: Jolaine Artist, MD;  Location: [facility] CATH  LAB;  Service: Cardiovascular;  Laterality: N/A;   PILONIDAL CYST EXCISION  1974   RIGHT/LEFT HEART CATH AND CORONARY ANGIOGRAPHY N/A 03/28/2020   Procedure: RIGHT/LEFT HEART CATH AND CORONARY ANGIOGRAPHY;  Surgeon: Jolaine Artist, MD;  Location: [facility] CV LAB;  Service: Cardiovascular;  Laterality: N/A;   TUMOR EXCISION  10/2011   Left thigh   TUMOR EXCISION  10/2011   Face    There were no vitals filed for this visit.   Subjective Assessment - 01/17/22 1502     Subjective Patient reports that everything went well at home with the exception of the PPT exercise during which there was one incidence of a low back "spasm." She states that she believes her back/hip has improved slightly, but it is still difficult for her to walk.    Limitations Lifting;Standing;Walking;House hold activities    How long can you stand comfortably? 10 min    How long can you walk comfortably? grocery shopping trip                               [facility] Adult PT Treatment/Exercise - 01/17/22 0001       Lumbar Exercises: Stretches   Lower Trunk Rotation Other (comment)   x8 each     Lumbar Exercises: Seated   Other Seated Lumbar Exercises Seated banded(red) clamshell 3x16      Lumbar Exercises: Supine   Pelvic Tilt 10 reps      Lumbar Exercises: Sidelying   Clam Both;Other (comment)   1x12 and 2x8 with RTB                      PT Short Term Goals - 01/12/22 1528       PT SHORT TERM GOAL #1   Title Patient will be independent with HEP in order to improve functional outcomes.    Time 2    Period Weeks    Status New    Target Date 01/26/22      PT SHORT TERM GOAL #2   Title Patient will report at least 25% improvement in symptoms for improved quality of life.    Baseline can stand x 10 min for dishwashing    Time 2    Period Weeks    Status New    Target Date 01/26/22               PT Long Term Goals - 01/12/22 1529  PT LONG TERM GOAL  #1   Title Patient will improve FOTO score by at least 5 points in order to indicate improved tolerance to activity.    Baseline 33.6% function    Time 4    Period Weeks    Status New    Target Date 02/09/22      PT LONG TERM GOAL #2   Title Be able to ambulate x 6 min continuously to manifest improved activity tolerance    Baseline DNT    Time 4    Period Weeks    Status New    Target Date 02/09/22                   Plan - 01/17/22 1412     Clinical Impression Statement Patient generally tolerated treatment well during today's session although she did indicate elevated pain levels in the back during the first two sidelying transfers needed for the clamshell exercise. Her pain levels reduced significantly with these transfers in the sets following and her transfer speed improved markedly.    Personal Factors and Comorbidities Comorbidity 3+;Past/Current Experience;Fitness;Time since onset of injury/illness/exacerbation    Comorbidities obesity, DM, CHF    Examination-Activity Limitations Carry;Lift;Stand;Locomotion Level    Examination-Participation Restrictions Church;Cleaning;Community Activity;Meal Prep    Stability/Clinical Decision Making Evolving/Moderate complexity    Rehab Potential Fair    PT Frequency 2x / week    PT Duration 4 weeks    PT Treatment/Interventions ADLs/Self Care Home Management;Electrical Stimulation;DME Instruction;Gait training;Stair training;Functional mobility training;Therapeutic activities;Therapeutic exercise;Balance training;Patient/family education;Neuromuscular re-education;Manual techniques;Dry needling;Energy conservation;Spinal Manipulations;Joint Manipulations    PT Next Visit Plan 2MWT, continue with lumbar and hip strengthening/stablization    PT Home Exercise Plan ab set, Clamshell with Resistance - 2-3 x daily - 7 x weekly - 3 sets - 8 reps,  Seated Hip Abduction - 2-3 x daily - 7 x weekly - 2 sets - 12 reps    Consulted and Agree  with Plan of Care Patient             Patient will benefit from skilled therapeutic intervention in order to improve the following deficits and impairments:  Decreased activity tolerance, Decreased mobility, Decreased endurance, Decreased strength, Difficulty walking, Improper body mechanics, Pain, Obesity  Visit Diagnosis: Low back pain, unspecified back pain laterality, unspecified chronicity, unspecified whether sciatica present  Muscle weakness (generalized)  Other abnormalities of gait and mobility  Other symptoms and signs involving the musculoskeletal system     Problem List Patient Active Problem List   Diagnosis Date Noted   Rotator cuff syndrome, left 12/13/2021   Asthma, persistent controlled 07/24/2021   Right carpal tunnel syndrome 06/14/2021   DOE (dyspnea on exertion) 12/13/2020   Pulmonary hypertension due to sleep-disordered breathing (HCC) 12/13/2020   Abdominal bloating 09/22/2020   Constipation 07/06/2020   Fibromyalgia 04/20/2020   Routine cervical smear 04/18/2020   Encounter for gynecological examination with Papanicolaou smear of cervix 04/18/2020   Encounter for colorectal cancer screening 04/18/2020   Chronic female pelvic pain 04/18/2020   Proteinuria 04/18/2020   Bad odor of urine 04/18/2020   Klebsiella pneumonia ([place]) 01/30/2019   E. coli UTI (urinary tract infection) 25/36/6440   Complicated UTI (urinary tract infection) 01/29/2019   Levoscoliosis 11/20/2017   Staghorn kidney stones 08/08/2017   GI bleed 05/29/2017   Sepsis due to urinary tract infection ([place]) 05/29/2017   UTI (urinary tract infection) 05/29/2017   Cervicalgia 11/21/2016   Chronic pain syndrome 10/11/2015   Lumbar  radiculopathy 07/25/2015   Fibroid 10/13/2013   Adnexal cyst 10/13/2013   PMB (postmenopausal bleeding) 10/06/2013   Unspecified symptom associated with female genital organs 10/06/2013   Hot flashes 10/06/2013   Internal hemorrhoids with other  complication 02/63/7858   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   Hip pain 04/09/2013   Hemorrhoids, internal 03/12/2013   Back pain 03/11/2013   Encounter for therapeutic drug monitoring 03/11/2013   Trochanteric bursitis 03/11/2013   Lumbar facet arthropathy 03/11/2013   Unstable angina ([place]) 06/02/2012   Acute on chronic renal insufficiency 06/02/2012   Chest pain 04/22/2012   IBS (irritable bowel syndrome)-DIARRHEA 01/23/2012   Morbid obesity ([place]) 08/09/2011   Diabetes mellitus ([place]) 08/09/2011   DIARRHEA 10/11/2010   Chronic diastolic heart failure ([place]) 12/16/2007   Pulmonary hypertension ([place]) 12/12/2007   OSTEOARTHRITIS 12/11/2007   OSA on CPAP 12/11/2007   3:06 PM,01/17/22 Adalberto Cole, DPT [facility] [address] [place], [place], [address] Phone: [phone]   Fax:  [phone]  Name: Sara Tran MRN: [id number] Date of Birth: [birth year]

## 2022-01-19 DIAGNOSIS — E119 Type 2 diabetes mellitus without complications: Secondary | ICD-10-CM | POA: Diagnosis not present

## 2022-01-19 DIAGNOSIS — E7849 Other hyperlipidemia: Secondary | ICD-10-CM | POA: Diagnosis not present

## 2022-01-19 DIAGNOSIS — L989 Disorder of the skin and subcutaneous tissue, unspecified: Secondary | ICD-10-CM | POA: Diagnosis not present

## 2022-01-19 DIAGNOSIS — E1129 Type 2 diabetes mellitus with other diabetic kidney complication: Secondary | ICD-10-CM | POA: Diagnosis not present

## 2022-01-19 DIAGNOSIS — G8929 Other chronic pain: Secondary | ICD-10-CM | POA: Diagnosis not present

## 2022-01-19 DIAGNOSIS — I10 Essential (primary) hypertension: Secondary | ICD-10-CM | POA: Diagnosis not present

## 2022-01-19 DIAGNOSIS — J302 Other seasonal allergic rhinitis: Secondary | ICD-10-CM | POA: Diagnosis not present

## 2022-01-19 DIAGNOSIS — E782 Mixed hyperlipidemia: Secondary | ICD-10-CM | POA: Diagnosis not present

## 2022-01-19 DIAGNOSIS — E559 Vitamin D deficiency, unspecified: Secondary | ICD-10-CM | POA: Diagnosis not present

## 2022-01-19 DIAGNOSIS — Z0000 Encounter for general adult medical examination without abnormal findings: Secondary | ICD-10-CM | POA: Diagnosis not present

## 2022-01-24 ENCOUNTER — Encounter (HOSPITAL_COMMUNITY): Payer: Medicare HMO | Admitting: Physical Therapy

## 2022-01-25 ENCOUNTER — Other Ambulatory Visit: Payer: Self-pay

## 2022-01-25 ENCOUNTER — Encounter (HOSPITAL_COMMUNITY): Payer: Self-pay | Admitting: Occupational Therapy

## 2022-01-25 ENCOUNTER — Ambulatory Visit (HOSPITAL_COMMUNITY): Payer: Medicare HMO | Admitting: Occupational Therapy

## 2022-01-25 DIAGNOSIS — M47816 Spondylosis without myelopathy or radiculopathy, lumbar region: Secondary | ICD-10-CM | POA: Diagnosis not present

## 2022-01-25 DIAGNOSIS — M797 Fibromyalgia: Secondary | ICD-10-CM | POA: Diagnosis not present

## 2022-01-25 DIAGNOSIS — M25512 Pain in left shoulder: Secondary | ICD-10-CM | POA: Diagnosis not present

## 2022-01-25 DIAGNOSIS — M545 Low back pain, unspecified: Secondary | ICD-10-CM | POA: Diagnosis not present

## 2022-01-25 DIAGNOSIS — R29898 Other symptoms and signs involving the musculoskeletal system: Secondary | ICD-10-CM | POA: Diagnosis not present

## 2022-01-25 DIAGNOSIS — M6281 Muscle weakness (generalized): Secondary | ICD-10-CM | POA: Diagnosis not present

## 2022-01-25 DIAGNOSIS — M25612 Stiffness of left shoulder, not elsewhere classified: Secondary | ICD-10-CM

## 2022-01-25 DIAGNOSIS — R2689 Other abnormalities of gait and mobility: Secondary | ICD-10-CM | POA: Diagnosis not present

## 2022-01-25 NOTE — Therapy (Signed)
[place], [place], [address] Phone: [phone]   Fax:  [phone]  Occupational Therapy Treatment  Patient Details  Name: Sara Tran MRN: [id number] Date of Birth: June 19, 1956 Referring Provider (OT): Alger Simons, MD   Encounter Date: 01/25/2022   OT End of Session - 01/25/22 1343     Visit Number 2    Number of Visits 12    Date for OT Re-Evaluation 02/28/22    Authorization Type [employer] Medicare    Authorization Time Period 12 visits approved 01/25/22-03/08/22    Authorization - Visit Number 1    Authorization - Number of Visits 12    Progress Note Due on Visit 10    OT Start Time 1307    OT Stop Time 1345    OT Time Calculation (min) 38 min    Activity Tolerance Patient tolerated treatment well;Patient limited by pain    Behavior During Therapy [facility] for tasks assessed/performed             Past Medical History:  Diagnosis Date   Adnexal cyst    Right simple cyst seen on [place]   Anginal pain (HCC)    Anxiety    Arthritis    Asthma    Atrial fibrillation (HCC)    hx of    CHF (congestive heart failure) (HCC)    Chronic back pain    Chronic hip pain    COPD (chronic obstructive pulmonary disease) ([place])    Coronary artery disease    stents placed    Dyspnea    GERD (gastroesophageal reflux disease)    Headache    due to pinched nerve damaage    Heart murmur    hx of slight murmur    History of bronchitis    History of cardiac catheterization    Normal coronaries 2013   History of kidney stones    Hot flashes    Hypertension    Hypertriglyceridemia    IBS (irritable bowel syndrome)    Internal hemorrhoids    Morbid obesity ([place])    s/p lap band surgery   Myocardial infarction ([place]) 12/2018   times 3   Obesity    Pulmonary HTN ([place])    [place] 2008:  RA pressures 13/10 with a mean of 7 mmHg.  RV pressure 42/68 with end diastolic pressure of 15 mmHg.  PA pressure 49/23 with a mean of 35  mmHg.  Pulmonary capillary wedge 17/50 with a mean of 14 mmHg. The PA saturation was 68%.  RA saturation was 71% and aortic saturation was 90%.  Cardiac output was 6.0 with a cardiac index of 2.80 by Fick.  Normal coronaries by cath 2008.   Renal insufficiency    Sciatica of right side    Sleep apnea    CPAP use does not know settings    Trauma    Type 2 diabetes mellitus ([place])    type II    Uterine fibroid     Past Surgical History:  Procedure Laterality Date   accupuncture     APPENDECTOMY     CARDIAC CATHETERIZATION  07/2007, 05/2012   Normal coronary arteries   COLONOSCOPY  10/18/2010   SLF:2-mm sessile sigmoid polyp/diverticula, tortuous colon.  Biopsies of sigmoid colon polyp and random colon biopsies benign   COLONOSCOPY WITH PROPOFOL N/A 08/09/2020   Procedure: COLONOSCOPY WITH PROPOFOL;  Surgeon: Eloise Harman, DO;  [facility];  Service: Endoscopy;  Laterality:  N/A;  11:00am   CYSTOSCOPY/URETEROSCOPY/HOLMIUM LASER/STENT PLACEMENT Right 08/01/2017   Procedure: CYSTOSCOPY/ RIGHT URETEROSCOPY/ RIGHT RETROGRADE/ HOLMIUM LASER  AND RIGHT STENT PLACEMENT FIRST STAGE;  Surgeon: Irine Seal, MD;  [facility];  Service: Urology;  Laterality: Right;   CYSTOSCOPY/URETEROSCOPY/HOLMIUM LASER/STENT PLACEMENT Right 08/08/2017   Procedure: RIGHT URETEROSCOPY WITH HOLMIUM LASER RIGHT STENT PLACEMENT;  Surgeon: Irine Seal, MD;  [facility];  Service: Urology;  Laterality: Right;   EXTRACORPOREAL SHOCK WAVE LITHOTRIPSY Right 08/22/2017   Procedure: RIGHT EXTRACORPOREAL SHOCK WAVE LITHOTRIPSY (ESWL);  Surgeon: Alexis Frock, MD;  [facility];  Service: Urology;  Laterality: Right;   HEMORRHOID BANDING  07/2012   Dr. Oneida Alar   LAPAROSCOPIC CHOLECYSTECTOMY  1985   LAPAROSCOPIC GASTRIC BANDING  12/12/2010   LEFT AND RIGHT HEART CATHETERIZATION WITH CORONARY ANGIOGRAM N/A 06/03/2012   Procedure: LEFT AND RIGHT HEART CATHETERIZATION WITH CORONARY ANGIOGRAM;  Surgeon: Jolaine Artist, MD;  Location: [facility] CATH LAB;  Service: Cardiovascular;  Laterality: N/A;   PILONIDAL CYST EXCISION  1974   RIGHT/LEFT HEART CATH AND CORONARY ANGIOGRAPHY N/A 03/28/2020   Procedure: RIGHT/LEFT HEART CATH AND CORONARY ANGIOGRAPHY;  Surgeon: Jolaine Artist, MD;  Location: [facility] CV LAB;  Service: Cardiovascular;  Laterality: N/A;   TUMOR EXCISION  10/2011   Left thigh   TUMOR EXCISION  10/2011   Face    There were no vitals filed for this visit.   Subjective Assessment - 01/25/22 1310     Subjective  S: I had a shower before I came.    Currently in Pain? Yes    Pain Score 2     Pain Location Shoulder    Pain Orientation Right    Pain Descriptors / Indicators Aching;Sore    Pain Type Acute pain    Pain Radiating Towards shoulder to elbow    Pain Onset More than a month ago    Pain Frequency Intermittent    Aggravating Factors  lifting, increased activity, sweeping,mopping    Pain Relieving Factors stretches, pain medication, rest    Effect of Pain on Daily Activities max effect on ADLs    Multiple Pain Sites No                [facility] OT Assessment - 01/25/22 1309       Assessment   Medical Diagnosis Left shoulder pain/bursitis      Precautions   Precautions None                      OT Treatments/Exercises (OP) - 01/25/22 1311       Exercises   Exercises Shoulder      Shoulder Exercises: Seated   Protraction PROM;5 reps;AROM;10 reps    Horizontal ABduction PROM;5 reps;AROM;10 reps    External Rotation PROM;5 reps;AROM;10 reps    Internal Rotation PROM;5 reps;AROM;10 reps    Flexion PROM;5 reps;AROM;10 reps    Abduction PROM;5 reps;AROM;10 reps      Shoulder Exercises: ROM/Strengthening   Wall Wash 1'    X to V Arms 10X A/ROM    Proximal Shoulder Strengthening, Seated 10X each, no rest breaks      Manual Therapy   Manual Therapy Myofascial release    Manual therapy comments completed separately from therapeutic exercises     Myofascial Release myofascial release and manual techniques to left upper arm and trapezius regions to decrease pain and fascial restrictions and increase joint ROM  OT Education - 01/25/22 1327     Education Details shoulder A/ROM    Person(s) Educated Patient    Methods Explanation;Handout;Demonstration    Comprehension Verbalized understanding;Returned demonstration              OT Short Term Goals - 01/25/22 1311       OT SHORT TERM GOAL #1   Title Patient will be educated and independent with HEP in order to increase functional use of her LUE and be able to use it for all required self care tasks such as dressing with less difficulty.    Time 6    Period Weeks    Status On-going    Target Date 02/28/22      OT SHORT TERM GOAL #2   Title Patient will increase her LUE A/ROM (IR/er) to [facility] in order to complete lower body dressing tasks (ie. manipulating pants) with decreased difficulty.    Time 6    Period Weeks    Status On-going      OT SHORT TERM GOAL #3   Title Patient will increase her LUE strength to 4+/5 with greater scapular stability and decreased muscle breaking while being tested.    Time 6    Period Weeks    Status On-going      OT SHORT TERM GOAL #4   Title Patient will report a decreased pain level of approximately 5/10 or less when utilizing her left arm to complete functional reaching tasks at or above her shoulder level.    Time 6    Period Weeks    Status On-going      OT SHORT TERM GOAL #5   Title Pt will decrease her LUE fascial restrictions to a minimal amount or less in order to increase the functional mobility needed to complete reaching tasks.    Time 6    Period Weeks    Status On-going                      Plan - 01/25/22 1325     Clinical Impression Statement A: Pt reports her HEP is going great. Initiated myofascial release to address fascial restrictions, passive stretching completed. Pt  completing A/ROM with ROM WFL today, proximal shoulder strengthening, wall wash. Verbal cuing for form and technique.    Body Structure / Function / Physical Skills ADL;UE functional use;Fascial restriction;Pain;ROM;IADL;Strength    Plan P: Follow up on HEP, trial low weights for strengthening    OT Home Exercise Plan eval: shoulder stretches, A/ROM scapular row    Consulted and Agree with Plan of Care Patient             Patient will benefit from skilled therapeutic intervention in order to improve the following deficits and impairments:   Body Structure / Function / Physical Skills: ADL, UE functional use, Fascial restriction, Pain, ROM, IADL, Strength       Visit Diagnosis: Other symptoms and signs involving the musculoskeletal system  Acute pain of left shoulder  Stiffness of left shoulder, not elsewhere classified    Problem List Patient Active Problem List   Diagnosis Date Noted   Rotator cuff syndrome, left 12/13/2021   Asthma, persistent controlled 07/24/2021   Right carpal tunnel syndrome 06/14/2021   DOE (dyspnea on exertion) 12/13/2020   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 coli UTI (urinary tract infection) 38/33/3832   Complicated UTI (urinary tract infection) 01/29/2019   Levoscoliosis 11/20/2017   Staghorn kidney stones 08/08/2017   GI bleed 05/29/2017   Sepsis due to urinary tract infection ([place]) 05/29/2017   UTI (urinary tract infection) 05/29/2017   Cervicalgia 11/21/2016   Chronic pain syndrome 10/11/2015    Lumbar radiculopathy 07/25/2015   Fibroid 10/13/2013   Adnexal cyst 10/13/2013   PMB (postmenopausal bleeding) 10/06/2013   Unspecified symptom associated with female genital organs 10/06/2013   Hot flashes 10/06/2013   Internal hemorrhoids with other complication 91/91/6606   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   Hip pain 04/09/2013   Hemorrhoids, internal 03/12/2013   Back pain 03/11/2013   Encounter for therapeutic drug monitoring 03/11/2013   Trochanteric bursitis 03/11/2013   Lumbar facet arthropathy 03/11/2013   Unstable angina ([place]) 06/02/2012   Acute on chronic renal insufficiency 06/02/2012   Chest pain 04/22/2012   IBS (irritable bowel syndrome)-DIARRHEA 01/23/2012   Morbid obesity ([place]) 08/09/2011   Diabetes mellitus ([place]) 08/09/2011   DIARRHEA 10/11/2010   Chronic diastolic heart failure ([place]) 12/16/2007   Pulmonary hypertension ([place]) 12/12/2007   OSTEOARTHRITIS 12/11/2007   OSA on CPAP 12/11/2007    Guadelupe Sabin, OTR/L  [phone] 01/25/2022, 1:44 PM  [facility] [address] [place], [place], [address] Phone: [phone]   Fax:  [phone]  Name: MILIANNA ERICSSON MRN: [id number] Date of Birth: 09-29-1956

## 2022-01-25 NOTE — Patient Instructions (Signed)
Repeat all exercises 10-15 times, 1-2 times per day.  1) Shoulder Protraction    Begin with elbows by your side, slowly "punch" straight out in front of you.      2) Shoulder Flexion          Begin with arms at your side with thumbs pointed up, slowly raise both arms up and forward towards overhead.               3) Horizontal abduction/adduction            Begin with arms straight out in front of you, bring out to the side in at "T" shape. Keep arms straight entire time.                 4) Internal & External Rotation        Stand with elbows at the side and elbows bent 90 degrees. Move your forearms away from your body, then bring back inward toward the body.     5) Shoulder Abduction        Lying on your back begin with your arms flat on the table next to your side. Slowly move your arms out to the side so that they go overhead, in a jumping jack or snow angel movement.

## 2022-01-26 ENCOUNTER — Encounter (HOSPITAL_COMMUNITY): Payer: Self-pay | Admitting: Physical Therapy

## 2022-01-26 ENCOUNTER — Ambulatory Visit (HOSPITAL_COMMUNITY): Payer: Medicare HMO | Admitting: Physical Therapy

## 2022-01-26 DIAGNOSIS — R29898 Other symptoms and signs involving the musculoskeletal system: Secondary | ICD-10-CM | POA: Diagnosis not present

## 2022-01-26 DIAGNOSIS — M25512 Pain in left shoulder: Secondary | ICD-10-CM | POA: Diagnosis not present

## 2022-01-26 DIAGNOSIS — R2689 Other abnormalities of gait and mobility: Secondary | ICD-10-CM | POA: Diagnosis not present

## 2022-01-26 DIAGNOSIS — M545 Low back pain, unspecified: Secondary | ICD-10-CM

## 2022-01-26 DIAGNOSIS — M25612 Stiffness of left shoulder, not elsewhere classified: Secondary | ICD-10-CM | POA: Diagnosis not present

## 2022-01-26 DIAGNOSIS — M6281 Muscle weakness (generalized): Secondary | ICD-10-CM

## 2022-01-26 DIAGNOSIS — M797 Fibromyalgia: Secondary | ICD-10-CM | POA: Diagnosis not present

## 2022-01-26 DIAGNOSIS — M47816 Spondylosis without myelopathy or radiculopathy, lumbar region: Secondary | ICD-10-CM | POA: Diagnosis not present

## 2022-01-26 NOTE — Patient Instructions (Signed)
Access Code: [email or web address] URL: [email or web address] Date: 01/26/2022 Prepared by: Josue Hector  Exercises Sit to Stand Without Arm Support - 2-3 x daily - 7 x weekly - 2 sets - 10 reps

## 2022-01-26 NOTE — Therapy (Signed)
[facility] [address] [place], [place], [address] Phone: [phone]   Fax:  [phone]  Physical Therapy Treatment  Patient Details  Name: Sara Tran MRN: [id number] Date of Birth: [birth year] Referring Provider (PT): Meredith Staggers, MD   Encounter Date: 01/26/2022   PT End of Session - 01/26/22 1656     Visit Number 3    Number of Visits 8    Date for PT Re-Evaluation 02/09/22    Authorization Type [employer] HMO, no VL, auth form attached    Authorization Time Period 1/13 - 2/17    Authorization - Visit Number 3    Authorization - Number of Visits 8    Progress Note Due on Visit 10    PT Start Time 1653   Had to use restroom beginning of session   PT Stop Time 1728    PT Time Calculation (min) 35 min    Activity Tolerance Patient tolerated treatment well    Behavior During Therapy [facility] for tasks assessed/performed             Past Medical History:  Diagnosis Date   Adnexal cyst    Right simple cyst seen on [place]   Anginal pain (HCC)    Anxiety    Arthritis    Asthma    Atrial fibrillation (HCC)    hx of    CHF (congestive heart failure) (HCC)    Chronic back pain    Chronic hip pain    COPD (chronic obstructive pulmonary disease) ([place])    Coronary artery disease    stents placed    Dyspnea    GERD (gastroesophageal reflux disease)    Headache    due to pinched nerve damaage    Heart murmur    hx of slight murmur    History of bronchitis    History of cardiac catheterization    Normal coronaries 2013   History of kidney stones    Hot flashes    Hypertension    Hypertriglyceridemia    IBS (irritable bowel syndrome)    Internal hemorrhoids    Morbid obesity ([place])    s/p lap band surgery   Myocardial infarction ([place]) 12/2018   times 3   Obesity    Pulmonary HTN ([place])    [place] 2008:  RA pressures 13/10 with a mean of 7 mmHg.  RV pressure 03/50 with end diastolic pressure of 15 mmHg.  PA pressure  49/23 with a mean of 35 mmHg.  Pulmonary capillary wedge 17/50 with a mean of 14 mmHg. The PA saturation was 68%.  RA saturation was 71% and aortic saturation was 90%.  Cardiac output was 6.0 with a cardiac index of 2.80 by Fick.  Normal coronaries by cath 2008.   Renal insufficiency    Sciatica of right side    Sleep apnea    CPAP use does not know settings    Trauma    Type 2 diabetes mellitus ([place])    type II    Uterine fibroid     Past Surgical History:  Procedure Laterality Date   accupuncture     APPENDECTOMY     CARDIAC CATHETERIZATION  07/2007, 05/2012   Normal coronary arteries   COLONOSCOPY  10/18/2010   SLF:2-mm sessile sigmoid polyp/diverticula, tortuous colon.  Biopsies of sigmoid colon polyp and random colon biopsies benign   COLONOSCOPY WITH PROPOFOL N/A 08/09/2020   Procedure: COLONOSCOPY WITH PROPOFOL;  Surgeon: Eloise Harman,  DO;  [facility];  Service: Endoscopy;  Laterality: N/A;  11:00am   CYSTOSCOPY/URETEROSCOPY/HOLMIUM LASER/STENT PLACEMENT Right 08/01/2017   Procedure: CYSTOSCOPY/ RIGHT URETEROSCOPY/ RIGHT RETROGRADE/ HOLMIUM LASER  AND RIGHT STENT PLACEMENT FIRST STAGE;  Surgeon: Irine Seal, MD;  [facility];  Service: Urology;  Laterality: Right;   CYSTOSCOPY/URETEROSCOPY/HOLMIUM LASER/STENT PLACEMENT Right 08/08/2017   Procedure: RIGHT URETEROSCOPY WITH HOLMIUM LASER RIGHT STENT PLACEMENT;  Surgeon: Irine Seal, MD;  [facility];  Service: Urology;  Laterality: Right;   EXTRACORPOREAL SHOCK WAVE LITHOTRIPSY Right 08/22/2017   Procedure: RIGHT EXTRACORPOREAL SHOCK WAVE LITHOTRIPSY (ESWL);  Surgeon: Alexis Frock, MD;  [facility];  Service: Urology;  Laterality: Right;   HEMORRHOID BANDING  07/2012   Dr. Oneida Alar   LAPAROSCOPIC CHOLECYSTECTOMY  1985   LAPAROSCOPIC GASTRIC BANDING  12/12/2010   LEFT AND RIGHT HEART CATHETERIZATION WITH CORONARY ANGIOGRAM N/A 06/03/2012   Procedure: LEFT AND RIGHT HEART CATHETERIZATION WITH CORONARY  ANGIOGRAM;  Surgeon: Jolaine Artist, MD;  Location: [facility] CATH LAB;  Service: Cardiovascular;  Laterality: N/A;   PILONIDAL CYST EXCISION  1974   RIGHT/LEFT HEART CATH AND CORONARY ANGIOGRAPHY N/A 03/28/2020   Procedure: RIGHT/LEFT HEART CATH AND CORONARY ANGIOGRAPHY;  Surgeon: Jolaine Artist, MD;  Location: [facility] CV LAB;  Service: Cardiovascular;  Laterality: N/A;   TUMOR EXCISION  10/2011   Left thigh   TUMOR EXCISION  10/2011   Face    There were no vitals filed for this visit.   Subjective Assessment - 01/26/22 1654     Subjective Patient says she "has some spots today". Having pain across lower back. She feels exercises are ok so far.    Limitations Lifting;Standing;Walking;House hold activities    How long can you stand comfortably? 10 min    How long can you walk comfortably? grocery shopping trip    Currently in Pain? Yes    Pain Score 7     Pain Location Back    Pain Orientation Lower;Posterior    Pain Descriptors / Indicators Aching;Sore                               [facility] Adult PT Treatment/Exercise - 01/26/22 0001       Lumbar Exercises: Stretches   Lower Trunk Rotation 5 reps;10 seconds      Lumbar Exercises: Aerobic   Nustep 5 min Lv 3 EOS for strength and conditioning      Lumbar Exercises: Standing   Heel Raises --      Lumbar Exercises: Seated   Sit to Stand 20 reps   2 x 10   Other Seated Lumbar Exercises seated clam RT 15 x 5", hip adduction 15 x 5"      Lumbar Exercises: Supine   Pelvic Tilt 15 reps    Glut Set 5 seconds;15 reps    Clam --    Clam Limitations --    Bent Knee Raise 20 reps    Bridge --                       PT Short Term Goals - 01/12/22 1528       PT SHORT TERM GOAL #1   Title Patient will be independent with HEP in order to improve functional outcomes.    Time 2    Period Weeks    Status New    Target Date 01/26/22  PT SHORT TERM GOAL #2   Title Patient will report at  least 25% improvement in symptoms for improved quality of life.    Baseline can stand x 10 min for dishwashing    Time 2    Period Weeks    Status New    Target Date 01/26/22               PT Long Term Goals - 01/12/22 1529       PT LONG TERM GOAL #1   Title Patient will improve FOTO score by at least 5 points in order to indicate improved tolerance to activity.    Baseline 33.6% function    Time 4    Period Weeks    Status New    Target Date 02/09/22      PT LONG TERM GOAL #2   Title Be able to ambulate x 6 min continuously to manifest improved activity tolerance    Baseline DNT    Time 4    Period Weeks    Status New    Target Date 02/09/22                   Plan - 01/26/22 1720     Clinical Impression Statement Patient tolerated session well. Required verbal and tactile cues for proper form with pelvic tilts. Progressed core and LE strengthening with added sit to stands and seated hip adduction. Continue to progress core strength for reduced back pain and improved ability to perform functional mobility/ transfers.    Personal Factors and Comorbidities Comorbidity 3+;Past/Current Experience;Fitness;Time since onset of injury/illness/exacerbation    Comorbidities obesity, DM, CHF    Examination-Activity Limitations Carry;Lift;Stand;Locomotion Level    Examination-Participation Restrictions Church;Cleaning;Community Activity;Meal Prep    Stability/Clinical Decision Making Evolving/Moderate complexity    Rehab Potential Fair    PT Frequency 2x / week    PT Duration 4 weeks    PT Treatment/Interventions ADLs/Self Care Home Management;Electrical Stimulation;DME Instruction;Gait training;Stair training;Functional mobility training;Therapeutic activities;Therapeutic exercise;Balance training;Patient/family education;Neuromuscular re-education;Manual techniques;Dry needling;Energy conservation;Spinal Manipulations;Joint Manipulations    PT Next Visit Plan 2MWT,  continue with lumbar and hip strengthening/stablization    PT Home Exercise Plan ab set, Clamshell with Resistance - 2-3 x daily - 7 x weekly - 3 sets - 8 reps,  Seated Hip Abduction - 2-3 x daily - 7 x weekly - 2 sets - 12 reps 2023-01-30 sit/stand    Consulted and Agree with Plan of Care Patient             Patient will benefit from skilled therapeutic intervention in order to improve the following deficits and impairments:  Decreased activity tolerance, Decreased mobility, Decreased endurance, Decreased strength, Difficulty walking, Improper body mechanics, Pain, Obesity  Visit Diagnosis: Low back pain, unspecified back pain laterality, unspecified chronicity, unspecified whether sciatica present  Muscle weakness (generalized)  Other abnormalities of gait and mobility     Problem List Patient Active Problem List   Diagnosis Date Noted   Rotator cuff syndrome, left 12/13/2021   Asthma, persistent controlled 07/24/2021   Right carpal tunnel syndrome 06/14/2021   DOE (dyspnea on exertion) 12/13/2020   Pulmonary hypertension due to sleep-disordered breathing ([place]) 12/13/2020   Abdominal bloating 09/22/2020   Constipation 07/06/2020   Fibromyalgia 04/20/2020   Routine cervical smear 04/18/2020   Encounter for gynecological examination with Papanicolaou smear of cervix 04/18/2020   Encounter for colorectal cancer screening 04/18/2020   Chronic female pelvic pain 04/18/2020   Proteinuria 04/18/2020  Bad odor of urine 04/18/2020   Klebsiella pneumonia ([place]) 01/30/2019   E. coli UTI (urinary tract infection) 65/46/5035   Complicated UTI (urinary tract infection) 01/29/2019   Levoscoliosis 11/20/2017   Staghorn kidney stones 08/08/2017   GI bleed 05/29/2017   Sepsis due to urinary tract infection ([place]) 05/29/2017   UTI (urinary tract infection) 05/29/2017   Cervicalgia 11/21/2016   Chronic pain syndrome 10/11/2015   Lumbar radiculopathy 07/25/2015   Fibroid 10/13/2013   Adnexal  cyst 10/13/2013   PMB (postmenopausal bleeding) 10/06/2013   Unspecified symptom associated with female genital organs 10/06/2013   Hot flashes 10/06/2013   Internal hemorrhoids with other complication 46/56/8127   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   Hip pain 04/09/2013   Hemorrhoids, internal 03/12/2013   Back pain 03/11/2013   Encounter for therapeutic drug monitoring 03/11/2013   Trochanteric bursitis 03/11/2013   Lumbar facet arthropathy 03/11/2013   Unstable angina ([place]) 06/02/2012   Acute on chronic renal insufficiency 06/02/2012   Chest pain 04/22/2012   IBS (irritable bowel syndrome)-DIARRHEA 01/23/2012   Morbid obesity ([place]) 08/09/2011   Diabetes mellitus ([place]) 08/09/2011   DIARRHEA 10/11/2010   Chronic diastolic heart failure ([place]) 12/16/2007   Pulmonary hypertension ([place]) 12/12/2007   OSTEOARTHRITIS 12/11/2007   OSA on CPAP 12/11/2007   5:28 PM, 01/26/22 Josue Hector PT DPT  Physical Therapist with [facility]  ([phone] [place], [place], [address] Phone: [phone]   Fax:  [phone]  Name: Sara Tran MRN: [id number] Date of Birth: 12-13-1956

## 2022-01-31 ENCOUNTER — Ambulatory Visit (HOSPITAL_COMMUNITY): Payer: Medicare HMO

## 2022-01-31 ENCOUNTER — Ambulatory Visit (HOSPITAL_COMMUNITY): Payer: Medicare HMO | Admitting: Physical Therapy

## 2022-02-01 ENCOUNTER — Other Ambulatory Visit: Payer: Self-pay

## 2022-02-01 ENCOUNTER — Ambulatory Visit (HOSPITAL_COMMUNITY): Payer: Medicare HMO | Attending: Physical Medicine & Rehabilitation | Admitting: Physical Therapy

## 2022-02-01 DIAGNOSIS — M25612 Stiffness of left shoulder, not elsewhere classified: Secondary | ICD-10-CM | POA: Insufficient documentation

## 2022-02-01 DIAGNOSIS — R29898 Other symptoms and signs involving the musculoskeletal system: Secondary | ICD-10-CM | POA: Diagnosis not present

## 2022-02-01 DIAGNOSIS — M545 Low back pain, unspecified: Secondary | ICD-10-CM | POA: Insufficient documentation

## 2022-02-01 DIAGNOSIS — R2689 Other abnormalities of gait and mobility: Secondary | ICD-10-CM | POA: Insufficient documentation

## 2022-02-01 DIAGNOSIS — M6281 Muscle weakness (generalized): Secondary | ICD-10-CM | POA: Diagnosis not present

## 2022-02-01 DIAGNOSIS — M25512 Pain in left shoulder: Secondary | ICD-10-CM | POA: Insufficient documentation

## 2022-02-01 NOTE — Therapy (Signed)
[facility] [address] [place], [place], [address] Phone: [phone]   Fax:  [phone]  Physical Therapy Treatment  Patient Details  Name: Sara Tran MRN: [id number] Date of Birth: 1956-11-13 Referring Provider (PT): Meredith Staggers, MD   Encounter Date: 02/01/2022   PT End of Session - 02/01/22 1746     Visit Number 4    Number of Visits 8    Date for PT Re-Evaluation 02/09/22    Authorization Type [employer] HMO, no VL, auth form attached    Authorization Time Period 1/13 - 2/17    Authorization - Visit Number 4    Authorization - Number of Visits 8    Progress Note Due on Visit 10    PT Start Time 1545    PT Stop Time 1620    PT Time Calculation (min) 35 min    Activity Tolerance Patient tolerated treatment well    Behavior During Therapy [facility] for tasks assessed/performed             Past Medical History:  Diagnosis Date   Adnexal cyst    Right simple cyst seen on [place]   Anginal pain (HCC)    Anxiety    Arthritis    Asthma    Atrial fibrillation (HCC)    hx of    CHF (congestive heart failure) (HCC)    Chronic back pain    Chronic hip pain    COPD (chronic obstructive pulmonary disease) ([place])    Coronary artery disease    stents placed    Dyspnea    GERD (gastroesophageal reflux disease)    Headache    due to pinched nerve damaage    Heart murmur    hx of slight murmur    History of bronchitis    History of cardiac catheterization    Normal coronaries 2013   History of kidney stones    Hot flashes    Hypertension    Hypertriglyceridemia    IBS (irritable bowel syndrome)    Internal hemorrhoids    Morbid obesity ([place])    s/p lap band surgery   Myocardial infarction ([place]) 12/2018   times 3   Obesity    Pulmonary HTN ([place])    [place] 2008:  RA pressures 13/10 with a mean of 7 mmHg.  RV pressure 72/09 with end diastolic pressure of 15 mmHg.  PA pressure 49/23 with a mean of 35 mmHg.  Pulmonary  capillary wedge 17/50 with a mean of 14 mmHg. The PA saturation was 68%.  RA saturation was 71% and aortic saturation was 90%.  Cardiac output was 6.0 with a cardiac index of 2.80 by Fick.  Normal coronaries by cath 2008.   Renal insufficiency    Sciatica of right side    Sleep apnea    CPAP use does not know settings    Trauma    Type 2 diabetes mellitus ([place])    type II    Uterine fibroid     Past Surgical History:  Procedure Laterality Date   accupuncture     APPENDECTOMY     CARDIAC CATHETERIZATION  07/2007, 05/2012   Normal coronary arteries   COLONOSCOPY  10/18/2010   SLF:2-mm sessile sigmoid polyp/diverticula, tortuous colon.  Biopsies of sigmoid colon polyp and random colon biopsies benign   COLONOSCOPY WITH PROPOFOL N/A 08/09/2020   Procedure: COLONOSCOPY WITH PROPOFOL;  Surgeon: Eloise Harman, DO;  [facility];  Service:  Endoscopy;  Laterality: N/A;  11:00am   CYSTOSCOPY/URETEROSCOPY/HOLMIUM LASER/STENT PLACEMENT Right 08/01/2017   Procedure: CYSTOSCOPY/ RIGHT URETEROSCOPY/ RIGHT RETROGRADE/ HOLMIUM LASER  AND RIGHT STENT PLACEMENT FIRST STAGE;  Surgeon: Irine Seal, MD;  [facility];  Service: Urology;  Laterality: Right;   CYSTOSCOPY/URETEROSCOPY/HOLMIUM LASER/STENT PLACEMENT Right 08/08/2017   Procedure: RIGHT URETEROSCOPY WITH HOLMIUM LASER RIGHT STENT PLACEMENT;  Surgeon: Irine Seal, MD;  [facility];  Service: Urology;  Laterality: Right;   EXTRACORPOREAL SHOCK WAVE LITHOTRIPSY Right 08/22/2017   Procedure: RIGHT EXTRACORPOREAL SHOCK WAVE LITHOTRIPSY (ESWL);  Surgeon: Alexis Frock, MD;  [facility];  Service: Urology;  Laterality: Right;   HEMORRHOID BANDING  07/2012   Dr. Oneida Alar   LAPAROSCOPIC CHOLECYSTECTOMY  1985   LAPAROSCOPIC GASTRIC BANDING  12/12/2010   LEFT AND RIGHT HEART CATHETERIZATION WITH CORONARY ANGIOGRAM N/A 06/03/2012   Procedure: LEFT AND RIGHT HEART CATHETERIZATION WITH CORONARY ANGIOGRAM;  Surgeon: Jolaine Artist, MD;   Location: [facility] CATH LAB;  Service: Cardiovascular;  Laterality: N/A;   PILONIDAL CYST EXCISION  1974   RIGHT/LEFT HEART CATH AND CORONARY ANGIOGRAPHY N/A 03/28/2020   Procedure: RIGHT/LEFT HEART CATH AND CORONARY ANGIOGRAPHY;  Surgeon: Jolaine Artist, MD;  Location: [facility] CV LAB;  Service: Cardiovascular;  Laterality: N/A;   TUMOR EXCISION  10/2011   Left thigh   TUMOR EXCISION  10/2011   Face    There were no vitals filed for this visit.   Subjective Assessment - 02/01/22 1549     Subjective pt states her lower back always hurts.  Reports both of her shoulders alllso hurt.    Currently in Pain? Yes    Pain Score 8     Pain Location Back    Pain Orientation Mid;Lower                               [facility] Adult PT Treatment/Exercise - 02/01/22 0001       Lumbar Exercises: Standing   Heel Raises 15 reps    Heel Raises Limitations toeraises 15 reps    Forward Lunge 10 reps    Forward Lunge Limitations onto 4" step without UE assist    Other Standing Lumbar Exercises hamstring curls, hip abduction 2 sets of 10 reps each with UE assist      Lumbar Exercises: Seated   Sit to Stand 20 reps    Sit to Stand Limitations 2 X10 no UE assist                       PT Short Term Goals - 01/12/22 1528       PT SHORT TERM GOAL #1   Title Patient will be independent with HEP in order to improve functional outcomes.    Time 2    Period Weeks    Status New    Target Date 01/26/22      PT SHORT TERM GOAL #2   Title Patient will report at least 25% improvement in symptoms for improved quality of life.    Baseline can stand x 10 min for dishwashing    Time 2    Period Weeks    Status New    Target Date 01/26/22               PT Long Term Goals - 01/12/22 1529       PT LONG TERM GOAL #1   Title Patient will  improve FOTO score by at least 5 points in order to indicate improved tolerance to activity.    Baseline 33.6% function    Time  4    Period Weeks    Status New    Target Date 02/09/22      PT LONG TERM GOAL #2   Title Be able to ambulate x 6 min continuously to manifest improved activity tolerance    Baseline DNT    Time 4    Period Weeks    Status New    Target Date 02/09/22                   Plan - 02/01/22 1747     Clinical Impression Statement Pt arrived 15 minutes late for appt.  Began standing LE strengthening exercises as noted weakness as tested in initial evaluation.   Pt c/o Rt dorsal foot pain closer towards ankle with activity today.  States it hurts at times.  No other complaints or issues.  Pt will continue to benefit from skilled physical therapy.    Personal Factors and Comorbidities Comorbidity 3+;Past/Current Experience;Fitness;Time since onset of injury/illness/exacerbation    Comorbidities obesity, DM, CHF    Examination-Activity Limitations Carry;Lift;Stand;Locomotion Level    Examination-Participation Restrictions Church;Cleaning;Community Activity;Meal Prep    Stability/Clinical Decision Making Evolving/Moderate complexity    Rehab Potential Fair    PT Frequency 2x / week    PT Duration 4 weeks    PT Treatment/Interventions ADLs/Self Care Home Management;Electrical Stimulation;DME Instruction;Gait training;Stair training;Functional mobility training;Therapeutic activities;Therapeutic exercise;Balance training;Patient/family education;Neuromuscular re-education;Manual techniques;Dry needling;Energy conservation;Spinal Manipulations;Joint Manipulations    PT Next Visit Plan Continue with lumbar and hip strengthening/stablization. Complete a 2 minute walk test next session.    PT Home Exercise Plan ab set, Clamshell with Resistance - 2-3 x daily - 7 x weekly - 3 sets - 8 reps,  Seated Hip Abduction - 2-3 x daily - 7 x weekly - 2 sets - 12 reps 02/02/2023 sit/stand    Consulted and Agree with Plan of Care Patient             Patient will benefit from skilled therapeutic intervention  in order to improve the following deficits and impairments:  Decreased activity tolerance, Decreased mobility, Decreased endurance, Decreased strength, Difficulty walking, Improper body mechanics, Pain, Obesity  Visit Diagnosis: Low back pain, unspecified back pain laterality, unspecified chronicity, unspecified whether sciatica present  Other abnormalities of gait and mobility  Muscle weakness (generalized)     Problem List Patient Active Problem List   Diagnosis Date Noted   Rotator cuff syndrome, left 12/13/2021   Asthma, persistent controlled 07/24/2021   Right carpal tunnel syndrome 06/14/2021   DOE (dyspnea on exertion) 12/13/2020   Pulmonary hypertension due to sleep-disordered breathing ([place]) 12/13/2020   Abdominal bloating 09/22/2020   Constipation 07/06/2020   Fibromyalgia 04/20/2020   Routine cervical smear 04/18/2020   Encounter for gynecological examination with Papanicolaou smear of cervix 04/18/2020   Encounter for colorectal cancer screening 04/18/2020   Chronic female pelvic pain 04/18/2020   Proteinuria 04/18/2020   Bad odor of urine 04/18/2020   Klebsiella pneumonia ([place]) 01/30/2019   E. coli UTI (urinary tract infection) 85/02/7740   Complicated UTI (urinary tract infection) 01/29/2019   Levoscoliosis 11/20/2017   Staghorn kidney stones 08/08/2017   GI bleed 05/29/2017   Sepsis due to urinary tract infection ([place]) 05/29/2017   UTI (urinary tract infection) 05/29/2017   Cervicalgia 11/21/2016   Chronic pain syndrome 10/11/2015   Lumbar  radiculopathy 07/25/2015   Fibroid 10/13/2013   Adnexal cyst 10/13/2013   PMB (postmenopausal bleeding) 10/06/2013   Unspecified symptom associated with female genital organs 10/06/2013   Hot flashes 10/06/2013   Internal hemorrhoids with other complication 59/29/2446   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   Hip pain 04/09/2013   Hemorrhoids, internal 03/12/2013   Back pain 03/11/2013   Encounter for therapeutic  drug monitoring 03/11/2013   Trochanteric bursitis 03/11/2013   Lumbar facet arthropathy 03/11/2013   Unstable angina ([place]) 06/02/2012   Acute on chronic renal insufficiency 06/02/2012   Chest pain 04/22/2012   IBS (irritable bowel syndrome)-DIARRHEA 01/23/2012   Morbid obesity ([place]) 08/09/2011   Diabetes mellitus ([place]) 08/09/2011   DIARRHEA 10/11/2010   Chronic diastolic heart failure ([place]) 12/16/2007   Pulmonary hypertension ([place]) 12/12/2007   OSTEOARTHRITIS 12/11/2007   OSA on CPAP 12/11/2007   Teena Irani, PTA/CLT, [facility] [phone]  Teena Irani, PTA 02/01/2022, 5:49 PM  [facility] [address] [place], [place], [address] Phone: [phone]   Fax:  [phone]  Name: Sara Tran MRN: [id number] Date of Birth: 1956/11/18

## 2022-02-02 ENCOUNTER — Ambulatory Visit (HOSPITAL_COMMUNITY): Payer: Medicare HMO | Admitting: Occupational Therapy

## 2022-02-02 ENCOUNTER — Telehealth (HOSPITAL_COMMUNITY): Payer: Self-pay | Admitting: Occupational Therapy

## 2022-02-02 NOTE — Telephone Encounter (Signed)
Pt called to cx her apptment today -she said we gave her a hard workout yesterday and she has been down today

## 2022-02-06 ENCOUNTER — Encounter (HOSPITAL_COMMUNITY): Payer: Self-pay | Admitting: Physical Therapy

## 2022-02-06 ENCOUNTER — Other Ambulatory Visit: Payer: Self-pay

## 2022-02-06 ENCOUNTER — Ambulatory Visit (HOSPITAL_COMMUNITY): Payer: Medicare HMO | Admitting: Physical Therapy

## 2022-02-06 DIAGNOSIS — M545 Low back pain, unspecified: Secondary | ICD-10-CM

## 2022-02-06 DIAGNOSIS — M25512 Pain in left shoulder: Secondary | ICD-10-CM | POA: Diagnosis not present

## 2022-02-06 DIAGNOSIS — M6281 Muscle weakness (generalized): Secondary | ICD-10-CM | POA: Diagnosis not present

## 2022-02-06 DIAGNOSIS — R2689 Other abnormalities of gait and mobility: Secondary | ICD-10-CM

## 2022-02-06 DIAGNOSIS — M25612 Stiffness of left shoulder, not elsewhere classified: Secondary | ICD-10-CM | POA: Diagnosis not present

## 2022-02-06 DIAGNOSIS — R29898 Other symptoms and signs involving the musculoskeletal system: Secondary | ICD-10-CM

## 2022-02-06 NOTE — Patient Instructions (Signed)
Access Code: [email or web address] URL: [email or web address] Date: 02/06/2022 Prepared by: Mitzi Hansen Senita Corredor  Exercises Standing Knee Flexion - 1 x daily - 7 x weekly - 2 sets - 10 reps Sit to Stand - 1 x daily - 7 x weekly - 2 sets - 10 reps

## 2022-02-06 NOTE — Therapy (Signed)
[facility] [address] [place], [place], [address] Phone: [phone]   Fax:  [phone]  Physical Therapy Treatment  Patient Details  Name: Sara Tran MRN: [id number] Date of Birth: Sep 19, 1956 Referring Provider (PT): Meredith Staggers, MD   Encounter Date: 02/06/2022   PT End of Session - 02/06/22 1408     Visit Number 5    Number of Visits 8    Date for PT Re-Evaluation 02/09/22    Authorization Type [employer] HMO, no VL, auth form attached    Authorization Time Period 1/13 - 2/17    Authorization - Visit Number 5    Authorization - Number of Visits 8    Progress Note Due on Visit 10    PT Start Time 1408   arrived at wrong time for appointment   PT Stop Time 1438    PT Time Calculation (min) 30 min    Activity Tolerance Patient tolerated treatment well    Behavior During Therapy [facility] for tasks assessed/performed             Past Medical History:  Diagnosis Date   Adnexal cyst    Right simple cyst seen on [place]   Anginal pain (HCC)    Anxiety    Arthritis    Asthma    Atrial fibrillation (HCC)    hx of    CHF (congestive heart failure) (HCC)    Chronic back pain    Chronic hip pain    COPD (chronic obstructive pulmonary disease) ([place])    Coronary artery disease    stents placed    Dyspnea    GERD (gastroesophageal reflux disease)    Headache    due to pinched nerve damaage    Heart murmur    hx of slight murmur    History of bronchitis    History of cardiac catheterization    Normal coronaries 2013   History of kidney stones    Hot flashes    Hypertension    Hypertriglyceridemia    IBS (irritable bowel syndrome)    Internal hemorrhoids    Morbid obesity ([place])    s/p lap band surgery   Myocardial infarction ([place]) 12/2018   times 3   Obesity    Pulmonary HTN ([place])    [place] 2008:  RA pressures 13/10 with a mean of 7 mmHg.  RV pressure 82/42 with end diastolic pressure of 15 mmHg.  PA pressure  49/23 with a mean of 35 mmHg.  Pulmonary capillary wedge 17/50 with a mean of 14 mmHg. The PA saturation was 68%.  RA saturation was 71% and aortic saturation was 90%.  Cardiac output was 6.0 with a cardiac index of 2.80 by Fick.  Normal coronaries by cath 2008.   Renal insufficiency    Sciatica of right side    Sleep apnea    CPAP use does not know settings    Trauma    Type 2 diabetes mellitus ([place])    type II    Uterine fibroid     Past Surgical History:  Procedure Laterality Date   accupuncture     APPENDECTOMY     CARDIAC CATHETERIZATION  07/2007, 05/2012   Normal coronary arteries   COLONOSCOPY  10/18/2010   SLF:2-mm sessile sigmoid polyp/diverticula, tortuous colon.  Biopsies of sigmoid colon polyp and random colon biopsies benign   COLONOSCOPY WITH PROPOFOL N/A 08/09/2020   Procedure: COLONOSCOPY WITH PROPOFOL;  Surgeon: Eloise Harman, DO;  [facility];  Service: Endoscopy;  Laterality: N/A;  11:00am   CYSTOSCOPY/URETEROSCOPY/HOLMIUM LASER/STENT PLACEMENT Right 08/01/2017   Procedure: CYSTOSCOPY/ RIGHT URETEROSCOPY/ RIGHT RETROGRADE/ HOLMIUM LASER  AND RIGHT STENT PLACEMENT FIRST STAGE;  Surgeon: Irine Seal, MD;  [facility];  Service: Urology;  Laterality: Right;   CYSTOSCOPY/URETEROSCOPY/HOLMIUM LASER/STENT PLACEMENT Right 08/08/2017   Procedure: RIGHT URETEROSCOPY WITH HOLMIUM LASER RIGHT STENT PLACEMENT;  Surgeon: Irine Seal, MD;  [facility];  Service: Urology;  Laterality: Right;   EXTRACORPOREAL SHOCK WAVE LITHOTRIPSY Right 08/22/2017   Procedure: RIGHT EXTRACORPOREAL SHOCK WAVE LITHOTRIPSY (ESWL);  Surgeon: Alexis Frock, MD;  [facility];  Service: Urology;  Laterality: Right;   HEMORRHOID BANDING  07/2012   Dr. Oneida Alar   LAPAROSCOPIC CHOLECYSTECTOMY  1985   LAPAROSCOPIC GASTRIC BANDING  12/12/2010   LEFT AND RIGHT HEART CATHETERIZATION WITH CORONARY ANGIOGRAM N/A 06/03/2012   Procedure: LEFT AND RIGHT HEART CATHETERIZATION WITH CORONARY  ANGIOGRAM;  Surgeon: Jolaine Artist, MD;  Location[facility] CATH LAB;  Service: Cardiovascular;  Laterality: N/A;   PILONIDAL CYST EXCISION  1974   RIGHT/LEFT HEART CATH AND CORONARY ANGIOGRAPHY N/A 03/28/2020   Procedure: RIGHT/LEFT HEART CATH AND CORONARY ANGIOGRAPHY;  Surgeon: Jolaine Artist, MD;  Location: [facility] CV LAB;  Service: Cardiovascular;  Laterality: N/A;   TUMOR EXCISION  10/2011   Left thigh   TUMOR EXCISION  10/2011   Face    There were no vitals filed for this visit.   Subjective Assessment - 02/06/22 1410     Subjective It is hard for her to get ready in the morning. She did exercises last night. pain in both calfs.    Currently in Pain? Yes    Pain Score 8     Pain Location Back    Pain Orientation Lower    Pain Descriptors / Indicators Aching                               [facility] Adult PT Treatment/Exercise - 02/06/22 0001       Lumbar Exercises: Stretches   Other Lumbar Stretch Exercise calf stretch on slant board 3x 30 second holds      Lumbar Exercises: Standing   Heel Raises 20 reps    Heel Raises Limitations toeraises 20 reps    Other Standing Lumbar Exercises hamstring curls 2x 10, hip abduction 2 x 10 reps each with HHA                       PT Short Term Goals - 01/12/22 1528       PT SHORT TERM GOAL #1   Title Patient will be independent with HEP in order to improve functional outcomes.    Time 2    Period Weeks    Status New    Target Date 01/26/22      PT SHORT TERM GOAL #2   Title Patient will report at least 25% improvement in symptoms for improved quality of life.    Baseline can stand x 10 min for dishwashing    Time 2    Period Weeks    Status New    Target Date 01/26/22               PT Long Term Goals - 01/12/22 1529       PT LONG TERM GOAL #1   Title Patient will improve FOTO score by  at least 5 points in order to indicate improved tolerance to activity.    Baseline 33.6%  function    Time 4    Period Weeks    Status New    Target Date 02/09/22      PT LONG TERM GOAL #2   Title Be able to ambulate x 6 min continuously to manifest improved activity tolerance    Baseline DNT    Time 4    Period Weeks    Status New    Target Date 02/09/22                   Plan - 02/06/22 1408     Clinical Impression Statement Session shortened as patient arrives at wrong time for appointment. Began session with calf stretch which improved symptoms. Continued with strengthening exercises. Patient requires intermittent cueing for mechanics of exercise throughout session. Patient will continue to benefit from skilled physical therapy in order to reduce impairment and improve function.    Personal Factors and Comorbidities Comorbidity 3+;Past/Current Experience;Fitness;Time since onset of injury/illness/exacerbation    Comorbidities obesity, DM, CHF    Examination-Activity Limitations Carry;Lift;Stand;Locomotion Level    Examination-Participation Restrictions Church;Cleaning;Community Activity;Meal Prep    Stability/Clinical Decision Making Evolving/Moderate complexity    Rehab Potential Fair    PT Frequency 2x / week    PT Duration 4 weeks    PT Treatment/Interventions ADLs/Self Care Home Management;Electrical Stimulation;DME Instruction;Gait training;Stair training;Functional mobility training;Therapeutic activities;Therapeutic exercise;Balance training;Patient/family education;Neuromuscular re-education;Manual techniques;Dry needling;Energy conservation;Spinal Manipulations;Joint Manipulations    PT Next Visit Plan Continue with lumbar and hip strengthening/stablization. Complete a 2 minute walk test next session.    PT Home Exercise Plan ab set, Clamshell with Resistance - 2-3 x daily - 7 x weekly - 3 sets - 8 reps,  Seated Hip Abduction - 2-3 x daily - 7 x weekly - 2 sets - 12 reps 02/07/23 sit/stand 2/7 hamstring curl, STS    Consulted and Agree with Plan of Care  Patient             Patient will benefit from skilled therapeutic intervention in order to improve the following deficits and impairments:  Decreased activity tolerance, Decreased mobility, Decreased endurance, Decreased strength, Difficulty walking, Improper body mechanics, Pain, Obesity  Visit Diagnosis: Low back pain, unspecified back pain laterality, unspecified chronicity, unspecified whether sciatica present  Other abnormalities of gait and mobility  Muscle weakness (generalized)  Other symptoms and signs involving the musculoskeletal system     Problem List Patient Active Problem List   Diagnosis Date Noted   Rotator cuff syndrome, left 12/13/2021   Asthma, persistent controlled 07/24/2021   Right carpal tunnel syndrome 06/14/2021   DOE (dyspnea on exertion) 12/13/2020   Pulmonary hypertension due to sleep-disordered breathing ([place]) 12/13/2020   Abdominal bloating 09/22/2020   Constipation 07/06/2020   Fibromyalgia 04/20/2020   Routine cervical smear 04/18/2020   Encounter for gynecological examination with Papanicolaou smear of cervix 04/18/2020   Encounter for colorectal cancer screening 04/18/2020   Chronic female pelvic pain 04/18/2020   Proteinuria 04/18/2020   Bad odor of urine 04/18/2020   Klebsiella pneumonia ([place]) 01/30/2019   E. coli UTI (urinary tract infection) 95/28/4132   Complicated UTI (urinary tract infection) 01/29/2019   Levoscoliosis 11/20/2017   Staghorn kidney stones 08/08/2017   GI bleed 05/29/2017   Sepsis due to urinary tract infection ([place]) 05/29/2017   UTI (urinary tract infection) 05/29/2017   Cervicalgia 11/21/2016   Chronic pain syndrome 10/11/2015  Lumbar radiculopathy 07/25/2015   Fibroid 10/13/2013   Adnexal cyst 10/13/2013   PMB (postmenopausal bleeding) 10/06/2013   Unspecified symptom associated with female genital organs 10/06/2013   Hot flashes 10/06/2013   Internal hemorrhoids with other complication 15/17/6160    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   Hip pain 04/09/2013   Hemorrhoids, internal 03/12/2013   Back pain 03/11/2013   Encounter for therapeutic drug monitoring 03/11/2013   Trochanteric bursitis 03/11/2013   Lumbar facet arthropathy 03/11/2013   Unstable angina ([place]) 06/02/2012   Acute on chronic renal insufficiency 06/02/2012   Chest pain 04/22/2012   IBS (irritable bowel syndrome)-DIARRHEA 01/23/2012   Morbid obesity ([place]) 08/09/2011   Diabetes mellitus ([place]) 08/09/2011   DIARRHEA 10/11/2010   Chronic diastolic heart failure ([place]) 12/16/2007   Pulmonary hypertension ([place]) 12/12/2007   OSTEOARTHRITIS 12/11/2007   OSA on CPAP 12/11/2007   2:41 PM, 02/06/22 Mearl Latin PT, DPT Physical Therapist at [facility] [place], [place], [address] Phone: [phone]   Fax:  [phone]  Name: SABRINNA YEARWOOD MRN: [id number] Date of Birth: 1956-09-30

## 2022-02-08 ENCOUNTER — Ambulatory Visit (HOSPITAL_COMMUNITY): Payer: Medicare HMO

## 2022-02-08 ENCOUNTER — Encounter (HOSPITAL_COMMUNITY): Payer: Medicare HMO | Admitting: Physical Therapy

## 2022-02-09 ENCOUNTER — Ambulatory Visit: Payer: Medicare HMO

## 2022-02-12 ENCOUNTER — Encounter (HOSPITAL_COMMUNITY): Payer: Self-pay

## 2022-02-12 ENCOUNTER — Ambulatory Visit (HOSPITAL_COMMUNITY): Payer: Medicare HMO

## 2022-02-12 ENCOUNTER — Other Ambulatory Visit: Payer: Self-pay

## 2022-02-12 DIAGNOSIS — M6281 Muscle weakness (generalized): Secondary | ICD-10-CM | POA: Diagnosis not present

## 2022-02-12 DIAGNOSIS — R29898 Other symptoms and signs involving the musculoskeletal system: Secondary | ICD-10-CM | POA: Diagnosis not present

## 2022-02-12 DIAGNOSIS — M25512 Pain in left shoulder: Secondary | ICD-10-CM

## 2022-02-12 DIAGNOSIS — M545 Low back pain, unspecified: Secondary | ICD-10-CM | POA: Diagnosis not present

## 2022-02-12 DIAGNOSIS — R2689 Other abnormalities of gait and mobility: Secondary | ICD-10-CM | POA: Diagnosis not present

## 2022-02-12 DIAGNOSIS — M25612 Stiffness of left shoulder, not elsewhere classified: Secondary | ICD-10-CM

## 2022-02-13 ENCOUNTER — Encounter: Payer: Medicare HMO | Attending: Physical Medicine & Rehabilitation | Admitting: Physical Medicine & Rehabilitation

## 2022-02-13 ENCOUNTER — Encounter: Payer: Self-pay | Admitting: Physical Medicine & Rehabilitation

## 2022-02-13 VITALS — BP 175/81 | HR 73 | Temp 98.1°F | Ht 60.0 in | Wt 255.4 lb

## 2022-02-13 DIAGNOSIS — M25531 Pain in right wrist: Secondary | ICD-10-CM | POA: Diagnosis not present

## 2022-02-13 DIAGNOSIS — M79641 Pain in right hand: Secondary | ICD-10-CM | POA: Diagnosis not present

## 2022-02-13 DIAGNOSIS — G5601 Carpal tunnel syndrome, right upper limb: Secondary | ICD-10-CM | POA: Insufficient documentation

## 2022-02-13 NOTE — Patient Instructions (Signed)
Acupuncture Acupuncture is a type of treatment that involves stimulating specific points on your body by inserting thin needles through your skin. Acupuncture is often used to treat pain, but it may also be used to help relieve other types of symptoms. Your health care provider may recommend acupuncture to help treat various conditions, such as: Migraine headaches. Tension headaches. Arthritis pain. Addiction. Chronic pain. Nausea and vomiting after a surgery. High blood pressure (hypertension). Chronic obstructive pulmonary disease (COPD). Nausea caused by cancer treatment. Sudden or severe (acute) pain. Acupuncture is based on traditional [place] medicine, which recognizes more than 2,000 points on the body that connect energy pathways (meridians) through the body. The goal in stimulating these points is to balance the physical, emotional, and mental energy in your body. Acupuncture is done by a health care provider who has specialized training (licensed acupuncture practitioner). Treatment often requires several acupuncture sessions. You may have acupuncture along with other medical treatments. Tell a health care provider about: Any allergies you have. All medicines you are taking, including vitamins, herbs, eye drops, creams, and over-the-counter medicines. Any blood disorders you have. Any surgeries you have had. Any medical conditions you have. Whether you are pregnant or may be pregnant. What are the risks? Generally, this is a safe treatment. However, problems may occur, including: Skin infection. Damage to organs or structures that are under the skin where a needle is placed. What happens before the treatment? Your acupuncture practitioner will ask about your medical history and your symptoms. You may have a physical exam. What happens during the treatment? The exact procedure will depend on your condition and how your acupuncture provider treats it. In general: Your skin will  be cleaned with a germ-killing (antiseptic) solution. Your acupuncture practitioner will open a new set of germ-free (sterile) needles. The needles will be gently inserted into your skin. They will be left in place for a certain amount of time. You may feel slight pain or a tingling sensation. Your acupuncture practitioner may: Apply electrical energy to the needles. Adjust the needles in certain ways. After your procedure, the acupuncture practitioner will remove the needles, throw them away, and clean your skin. The procedure may vary among health care providers. What can I expect after the treatment? People react differently to acupuncture. Make sure you ask your acupuncture provider what to expect after your treatment. It is common to have: Minor bruising. Mild pain. A small amount of bleeding. Follow these instructions at home: Follow any instructions given by your provider after the treatment. Keep all follow-up visits as told by your health care provider. This is important. Contact a health care provider if: You have questions about your reaction to the treatment. You have soreness. You have skin irritation or redness. You have a fever. Summary Acupuncture is a type of treatment that involves stimulating specific points on your body by inserting thin needles through your skin. This treatment is often used to treat pain, but it may also be used to help relieve other types of symptoms. The exact procedure will depend on your condition and how your acupuncture provider treats it. This information is not intended to replace advice given to you by your health care provider. Make sure you discuss any questions you have with your health care provider. Document Revised: 06/08/2021 Document Reviewed: 12/01/2020 Elsevier Patient Education  2022 [employer].

## 2022-02-13 NOTE — Progress Notes (Signed)
Acupuncture treatment #4 Last treatment 11/21/2021  Needles placed at Th4-Th5 T8  as well as MH 6 and MH 7 electrical stimulation at 100Hz  between [place] 8 and MH6 as well as between Th5 and MH 7 Patient tolerated procedure well Post procedure instructions given Total treatment time 25 minutes

## 2022-02-13 NOTE — Therapy (Signed)
[facility] [address] [place], [place], [address] Phone: [phone]   Fax:  [phone]  Occupational Therapy Treatment  Patient Details  Name: Sara Tran MRN: [id number] Date of Birth: 1956-06-25 Referring Provider (OT): Alger Simons, MD   Encounter Date: 02/12/2022   OT End of Session - 02/13/22 0906     Visit Number 3    Number of Visits 12    Date for OT Re-Evaluation 02/28/22    Authorization Type [employer]    Authorization Time Period 12 visits approved 01/25/22-03/08/22    Authorization - Visit Number 2    Authorization - Number of Visits 12    Progress Note Due on Visit 10    OT Start Time 1312   Pt arrived late   OT Stop Time 1346    OT Time Calculation (min) 34 min    Activity Tolerance Patient tolerated treatment well    Behavior During Therapy [facility] for tasks assessed/performed             Past Medical History:  Diagnosis Date   Adnexal cyst    Right simple cyst seen on [place]   Anginal pain (HCC)    Anxiety    Arthritis    Asthma    Atrial fibrillation (HCC)    hx of    CHF (congestive heart failure) (HCC)    Chronic back pain    Chronic hip pain    COPD (chronic obstructive pulmonary disease) ([place])    Coronary artery disease    stents placed    Dyspnea    GERD (gastroesophageal reflux disease)    Headache    due to pinched nerve damaage    Heart murmur    hx of slight murmur    History of bronchitis    History of cardiac catheterization    Normal coronaries 2013   History of kidney stones    Hot flashes    Hypertension    Hypertriglyceridemia    IBS (irritable bowel syndrome)    Internal hemorrhoids    Morbid obesity ([place])    s/p lap band surgery   Myocardial infarction ([place]) 12/2018   times 3   Obesity    Pulmonary HTN ([place])    [place] 2008:  RA pressures 13/10 with a mean of 7 mmHg.  RV pressure 80/03 with end diastolic pressure of 15 mmHg.  PA pressure 49/23 with a mean of 35 mmHg.   Pulmonary capillary wedge 17/50 with a mean of 14 mmHg. The PA saturation was 68%.  RA saturation was 71% and aortic saturation was 90%.  Cardiac output was 6.0 with a cardiac index of 2.80 by Fick.  Normal coronaries by cath 2008.   Renal insufficiency    Sciatica of right side    Sleep apnea    CPAP use does not know settings    Trauma    Type 2 diabetes mellitus ([place])    type II    Uterine fibroid     Past Surgical History:  Procedure Laterality Date   accupuncture     APPENDECTOMY     CARDIAC CATHETERIZATION  07/2007, 05/2012   Normal coronary arteries   COLONOSCOPY  10/18/2010   SLF:2-mm sessile sigmoid polyp/diverticula, tortuous colon.  Biopsies of sigmoid colon polyp and random colon biopsies benign   COLONOSCOPY WITH PROPOFOL N/A 08/09/2020   Procedure: COLONOSCOPY WITH PROPOFOL;  Surgeon: Eloise Harman, DO;  [facility];  Service: Endoscopy;  Laterality: N/A;  11:00am   CYSTOSCOPY/URETEROSCOPY/HOLMIUM LASER/STENT PLACEMENT Right 08/01/2017   Procedure: CYSTOSCOPY/ RIGHT URETEROSCOPY/ RIGHT RETROGRADE/ HOLMIUM LASER  AND RIGHT STENT PLACEMENT FIRST STAGE;  Surgeon: Irine Seal, MD;  [facility];  Service: Urology;  Laterality: Right;   CYSTOSCOPY/URETEROSCOPY/HOLMIUM LASER/STENT PLACEMENT Right 08/08/2017   Procedure: RIGHT URETEROSCOPY WITH HOLMIUM LASER RIGHT STENT PLACEMENT;  Surgeon: Irine Seal, MD;  [facility];  Service: Urology;  Laterality: Right;   EXTRACORPOREAL SHOCK WAVE LITHOTRIPSY Right 08/22/2017   Procedure: RIGHT EXTRACORPOREAL SHOCK WAVE LITHOTRIPSY (ESWL);  Surgeon: Alexis Frock, MD;  [facility];  Service: Urology;  Laterality: Right;   HEMORRHOID BANDING  07/2012   Dr. Oneida Alar   LAPAROSCOPIC CHOLECYSTECTOMY  1985   LAPAROSCOPIC GASTRIC BANDING  12/12/2010   LEFT AND RIGHT HEART CATHETERIZATION WITH CORONARY ANGIOGRAM N/A 06/03/2012   Procedure: LEFT AND RIGHT HEART CATHETERIZATION WITH CORONARY ANGIOGRAM;  Surgeon: Jolaine Artist, MD;  Location: [facility] CATH LAB;  Service: Cardiovascular;  Laterality: N/A;   PILONIDAL CYST EXCISION  1974   RIGHT/LEFT HEART CATH AND CORONARY ANGIOGRAPHY N/A 03/28/2020   Procedure: RIGHT/LEFT HEART CATH AND CORONARY ANGIOGRAPHY;  Surgeon: Jolaine Artist, MD;  Location: [facility] CV LAB;  Service: Cardiovascular;  Laterality: N/A;   TUMOR EXCISION  10/2011   Left thigh   TUMOR EXCISION  10/2011   Face    There were no vitals filed for this visit.   Subjective Assessment - 02/12/22 1315     Subjective  S: My arm is so sore today. The arthritis is just grinding.    Currently in Pain? Yes    Pain Score 5     Pain Location Shoulder    Pain Orientation Left    Pain Descriptors / Indicators Other (Comment);Constant   grinding   Pain Type Acute pain    Pain Onset More than a month ago    Pain Frequency Constant    Aggravating Factors  lifting, increased activity, sweeping, mopping    Pain Relieving Factors stretches, pain medication, rest    Effect of Pain on Daily Activities max effect on ADL tasks    Multiple Pain Sites No                [facility] OT Assessment - 02/12/22 1333       Assessment   Medical Diagnosis Left shoulder pain/bursitis      Precautions   Precautions None    Precaution Comments history right shoulder RTC tear, decreased sensation in bilateral hand finger tips.                      OT Treatments/Exercises (OP) - 02/12/22 1333       Exercises   Exercises Shoulder      Shoulder Exercises: Supine   Protraction PROM;5 reps;Strengthening;10 reps    Protraction Weight (lbs) 1    Horizontal ABduction PROM;5 reps    External Rotation PROM;5 reps    Internal Rotation PROM;5 reps    Flexion PROM;5 reps;Strengthening;10 reps    Shoulder Flexion Weight (lbs) 1    ABduction PROM;5 reps      Manual Therapy   Manual Therapy Myofascial release    Manual therapy comments completed separately from therapeutic exercises     Myofascial Release myofascial release and manual techniques to left upper arm and trapezius regions to decrease pain and fascial restrictions and increase joint ROM  OT Short Term Goals - 01/25/22 1311       OT SHORT TERM GOAL #1   Title Patient will be educated and independent with HEP in order to increase functional use of her LUE and be able to use it for all required self care tasks such as dressing with less difficulty.    Time 6    Period Weeks    Status On-going    Target Date 02/28/22      OT SHORT TERM GOAL #2   Title Patient will increase her LUE A/ROM (IR/er) to [facility] in order to complete lower body dressing tasks (ie. manipulating pants) with decreased difficulty.    Time 6    Period Weeks    Status On-going      OT SHORT TERM GOAL #3   Title Patient will increase her LUE strength to 4+/5 with greater scapular stability and decreased muscle breaking while being tested.    Time 6    Period Weeks    Status On-going      OT SHORT TERM GOAL #4   Title Patient will report a decreased pain level of approximately 5/10 or less when utilizing her left arm to complete functional reaching tasks at or above her shoulder level.    Time 6    Period Weeks    Status On-going      OT SHORT TERM GOAL #5   Title Pt will decrease her LUE fascial restrictions to a minimal amount or less in order to increase the functional mobility needed to complete reaching tasks.    Time 6    Period Weeks    Status On-going                      Plan - 02/13/22 0907     Clinical Impression Statement A: Myofascial release completed to address minimal fascial restriction noted in left anterior shoulder near bicep tendon. Completed supine strengthening using 1# hand weight. Completed IR/er and abduction seated with 1#. VC for form and technique.    Body Structure / Function / Physical Skills ADL;UE functional use;Fascial restriction;Pain;ROM;IADL;Strength     Plan P: Continue with low weight strengthening. Update HEP to strengthening.    Consulted and Agree with Plan of Care Patient             Patient will benefit from skilled therapeutic intervention in order to improve the following deficits and impairments:   Body Structure / Function / Physical Skills: ADL, UE functional use, Fascial restriction, Pain, ROM, IADL, Strength       Visit Diagnosis: Other symptoms and signs involving the musculoskeletal system  Acute pain of left shoulder  Stiffness of left shoulder, not elsewhere classified    Problem List Patient Active Problem List   Diagnosis Date Noted   Rotator cuff syndrome, left 12/13/2021   Asthma, persistent controlled 07/24/2021   Right carpal tunnel syndrome 06/14/2021   DOE (dyspnea on exertion) 12/13/2020   Pulmonary hypertension due to sleep-disordered breathing ([place]) 12/13/2020   Abdominal bloating 09/22/2020   Constipation 07/06/2020   Fibromyalgia 04/20/2020   Routine cervical smear 04/18/2020   Encounter for gynecological examination with Papanicolaou smear of cervix 04/18/2020   Encounter for colorectal cancer screening 04/18/2020   Chronic female pelvic pain 04/18/2020   Proteinuria 04/18/2020   Bad odor of urine 04/18/2020   Klebsiella pneumonia ([place]) 01/30/2019   E. coli UTI (urinary tract infection) 76/19/5093   Complicated UTI (urinary tract infection) 01/29/2019  Levoscoliosis 11/20/2017   Staghorn kidney stones 08/08/2017   GI bleed 05/29/2017   Sepsis due to urinary tract infection ([place]) 05/29/2017   UTI (urinary tract infection) 05/29/2017   Cervicalgia 11/21/2016   Chronic pain syndrome 10/11/2015   Lumbar radiculopathy 07/25/2015   Fibroid 10/13/2013   Adnexal cyst 10/13/2013   PMB (postmenopausal bleeding) 10/06/2013   Unspecified symptom associated with female genital organs 10/06/2013   Hot flashes 10/06/2013   Internal hemorrhoids with other complication 56/38/9373   Anal  fissure and fistula(565) 07/29/2013   Hip pain 04/09/2013   Hemorrhoids, internal 03/12/2013   Back pain 03/11/2013   Encounter for therapeutic drug monitoring 03/11/2013   Trochanteric bursitis 03/11/2013   Lumbar facet arthropathy 03/11/2013   Unstable angina ([place]) 06/02/2012   Acute on chronic renal insufficiency 06/02/2012   Chest pain 04/22/2012   IBS (irritable bowel syndrome)-DIARRHEA 01/23/2012   Morbid obesity ([place]) 08/09/2011   Diabetes mellitus ([place]) 08/09/2011   DIARRHEA 10/11/2010   Chronic diastolic heart failure ([place]) 12/16/2007   Pulmonary hypertension ([place]) 12/12/2007   OSTEOARTHRITIS 12/11/2007   OSA on CPAP 12/11/2007    Ailene Ravel, OTR/L,CBIS  [phone]  02/13/2022, 10:48 AM  [facility] [address] [place], [place], [address] Phone: [phone]   Fax:  [phone]  Name: Sara Tran MRN: [id number] Date of Birth: Aug 22, 1956

## 2022-02-15 ENCOUNTER — Encounter (HOSPITAL_COMMUNITY): Payer: Self-pay

## 2022-02-15 ENCOUNTER — Other Ambulatory Visit: Payer: Self-pay

## 2022-02-15 ENCOUNTER — Ambulatory Visit (HOSPITAL_COMMUNITY): Payer: Medicare HMO

## 2022-02-15 DIAGNOSIS — M6281 Muscle weakness (generalized): Secondary | ICD-10-CM | POA: Diagnosis not present

## 2022-02-15 DIAGNOSIS — M25612 Stiffness of left shoulder, not elsewhere classified: Secondary | ICD-10-CM | POA: Diagnosis not present

## 2022-02-15 DIAGNOSIS — R29898 Other symptoms and signs involving the musculoskeletal system: Secondary | ICD-10-CM

## 2022-02-15 DIAGNOSIS — R2689 Other abnormalities of gait and mobility: Secondary | ICD-10-CM | POA: Diagnosis not present

## 2022-02-15 DIAGNOSIS — M25512 Pain in left shoulder: Secondary | ICD-10-CM

## 2022-02-15 DIAGNOSIS — M545 Low back pain, unspecified: Secondary | ICD-10-CM | POA: Diagnosis not present

## 2022-02-15 NOTE — Therapy (Signed)
[place], [place], [address] Phone: [phone]   Fax:  [phone]  Occupational Therapy Treatment  Patient Details  Name: Sara Tran MRN: [id number] Date of Birth: 01-14-1956 Referring Provider (OT): Alger Simons, MD   Encounter Date: 02/15/2022   OT End of Session - 02/15/22 1533     Visit Number 4    Number of Visits 12    Date for OT Re-Evaluation 02/28/22    Authorization Type [employer]    Authorization Time Period 12 visits approved 01/25/22-03/08/22    Authorization - Visit Number 3    Authorization - Number of Visits 12    Progress Note Due on Visit 10    OT Start Time 1354   arrived late   OT Stop Time 1427    OT Time Calculation (min) 33 min    Activity Tolerance Patient tolerated treatment well    Behavior During Therapy WFL for tasks assessed/performed             Past Medical History:  Diagnosis Date   Adnexal cyst    Right simple cyst seen on [place]   Anginal pain (HCC)    Anxiety    Arthritis    Asthma    Atrial fibrillation (HCC)    hx of    CHF (congestive heart failure) (HCC)    Chronic back pain    Chronic hip pain    COPD (chronic obstructive pulmonary disease) ([place])    Coronary artery disease    stents placed    Dyspnea    GERD (gastroesophageal reflux disease)    Headache    due to pinched nerve damaage    Heart murmur    hx of slight murmur    History of bronchitis    History of cardiac catheterization    Normal coronaries 2013   History of kidney stones    Hot flashes    Hypertension    Hypertriglyceridemia    IBS (irritable bowel syndrome)    Internal hemorrhoids    Morbid obesity ([place])    s/p lap band surgery   Myocardial infarction ([place]) 12/2018   times 3   Obesity    Pulmonary HTN ([place])    [place] 2008:  RA pressures 13/10 with a mean of 7 mmHg.  RV pressure 69/62 with end diastolic pressure of 15 mmHg.  PA pressure 49/23 with a mean of 35 mmHg.   Pulmonary capillary wedge 17/50 with a mean of 14 mmHg. The PA saturation was 68%.  RA saturation was 71% and aortic saturation was 90%.  Cardiac output was 6.0 with a cardiac index of 2.80 by Fick.  Normal coronaries by cath 2008.   Renal insufficiency    Sciatica of right side    Sleep apnea    CPAP use does not know settings    Trauma    Type 2 diabetes mellitus ([place])    type II    Uterine fibroid     Past Surgical History:  Procedure Laterality Date   accupuncture     APPENDECTOMY     CARDIAC CATHETERIZATION  07/2007, 05/2012   Normal coronary arteries   COLONOSCOPY  10/18/2010   SLF:2-mm sessile sigmoid polyp/diverticula, tortuous colon.  Biopsies of sigmoid colon polyp and random colon biopsies benign   COLONOSCOPY WITH PROPOFOL N/A 08/09/2020   Procedure: COLONOSCOPY WITH PROPOFOL;  Surgeon: Eloise Harman, DO;  [facility];  Service: Endoscopy;  Laterality:  N/A;  11:00am   CYSTOSCOPY/URETEROSCOPY/HOLMIUM LASER/STENT PLACEMENT Right 08/01/2017   Procedure: CYSTOSCOPY/ RIGHT URETEROSCOPY/ RIGHT RETROGRADE/ HOLMIUM LASER  AND RIGHT STENT PLACEMENT FIRST STAGE;  Surgeon: Irine Seal, MD;  Location: [facility];  Service: Urology;  Laterality: Right;   CYSTOSCOPY/URETEROSCOPY/HOLMIUM LASER/STENT PLACEMENT Right 08/08/2017   Procedure: RIGHT URETEROSCOPY WITH HOLMIUM LASER RIGHT STENT PLACEMENT;  Surgeon: Irine Seal, MD;  Location: [facility];  Service: Urology;  Laterality: Right;   EXTRACORPOREAL SHOCK WAVE LITHOTRIPSY Right 08/22/2017   Procedure: RIGHT EXTRACORPOREAL SHOCK WAVE LITHOTRIPSY (ESWL);  Surgeon: Alexis Frock, MD;  Location: [facility];  Service: Urology;  Laterality: Right;   HEMORRHOID BANDING  07/2012   Dr. Oneida Alar   LAPAROSCOPIC CHOLECYSTECTOMY  1985   LAPAROSCOPIC GASTRIC BANDING  12/12/2010   LEFT AND RIGHT HEART CATHETERIZATION WITH CORONARY ANGIOGRAM N/A 06/03/2012   Procedure: LEFT AND RIGHT HEART CATHETERIZATION WITH CORONARY ANGIOGRAM;  Surgeon: Jolaine Artist, MD;  Location[facility] CATH LAB;  Service: Cardiovascular;  Laterality: N/A;   PILONIDAL CYST EXCISION  1974   RIGHT/LEFT HEART CATH AND CORONARY ANGIOGRAPHY N/A 03/28/2020   Procedure: RIGHT/LEFT HEART CATH AND CORONARY ANGIOGRAPHY;  Surgeon: Jolaine Artist, MD;  Location: [facility] CV LAB;  Service: Cardiovascular;  Laterality: N/A;   TUMOR EXCISION  10/2011   Left thigh   TUMOR EXCISION  10/2011   Face    There were no vitals filed for this visit.   Subjective Assessment - 02/15/22 1358     Currently in Pain? Yes    Pain Score 5     Pain Location Shoulder    Pain Orientation Left    Pain Descriptors / Indicators Constant;Sore    Pain Type Acute pain    Pain Onset More than a month ago    Pain Frequency Constant    Aggravating Factors  activity    Pain Relieving Factors rest, pain medication, stretches    Effect of Pain on Daily Activities mod-max    Multiple Pain Sites No                [facility] OT Assessment - 02/15/22 1539       Assessment   Medical Diagnosis Left shoulder pain/bursitis      Precautions   Precautions None    Precaution Comments history right shoulder RTC tear, decreased sensation in bilateral hand finger tips.                      OT Treatments/Exercises (OP) - 02/15/22 0001       Shoulder Exercises: Seated   Protraction Strengthening;10 reps    Protraction Weight (lbs) 1    Horizontal ABduction PROM;5 reps;Strengthening;10 reps    Horizontal ABduction Weight (lbs) 1    External Rotation PROM;5 reps;Strengthening;10 reps    External Rotation Weight (lbs) 1    Internal Rotation PROM;5 reps;Strengthening;10 reps    Internal Rotation Weight (lbs) 1    Flexion PROM;5 reps;Strengthening;10 reps    Flexion Weight (lbs) 1    Abduction PROM;5 reps;Strengthening;10 reps    ABduction Weight (lbs) 1      Shoulder Exercises: ROM/Strengthening   Proximal Shoulder Strengthening, Seated 10X each, no rest breaks       Functional Reaching Activities   High Level 10 cones placed on 2nd shelf of cabinet using LUE. Once placed, all cones were removed in same fashion.      Manual Therapy   Manual Therapy Myofascial release    Manual therapy comments  completed separately from therapeutic exercises    Myofascial Release myofascial release and manual techniques to left upper arm and trapezius regions to decrease pain and fascial restrictions and increase joint ROM                    OT Education - 02/15/22 1532     Education Details shoulder strengthening    Person(s) Educated Patient    Methods Explanation;Demonstration;Verbal cues;Handout    Comprehension Returned demonstration;Verbalized understanding              OT Short Term Goals - 01/25/22 1311       OT SHORT TERM GOAL #1   Title Patient will be educated and independent with HEP in order to increase functional use of her LUE and be able to use it for all required self care tasks such as dressing with less difficulty.    Time 6    Period Weeks    Status On-going    Target Date 02/28/22      OT SHORT TERM GOAL #2   Title Patient will increase her LUE A/ROM (IR/er) to [facility] in order to complete lower body dressing tasks (ie. manipulating pants) with decreased difficulty.    Time 6    Period Weeks    Status On-going      OT SHORT TERM GOAL #3   Title Patient will increase her LUE strength to 4+/5 with greater scapular stability and decreased muscle breaking while being tested.    Time 6    Period Weeks    Status On-going      OT SHORT TERM GOAL #4   Title Patient will report a decreased pain level of approximately 5/10 or less when utilizing her left arm to complete functional reaching tasks at or above her shoulder level.    Time 6    Period Weeks    Status On-going      OT SHORT TERM GOAL #5   Title Pt will decrease her LUE fascial restrictions to a minimal amount or less in order to increase the functional mobility needed  to complete reaching tasks.    Time 6    Period Weeks    Status On-going                      Plan - 02/15/22 1533     Clinical Impression Statement A: Mat table positioned in chair position with 2 pillows for support while myofascial release was completed to left upper arm and upper trapezius region. Completed strengthening using 1# weight. VC for form and technique. Modifications provided if needed. HEP was updated and reviewed for strengthening. Pt demonstrates functional P/ROM of the shoulder.    Body Structure / Function / Physical Skills ADL;UE functional use;Fascial restriction;Pain;ROM;IADL;Strength    Plan P: Continue with myofascial release although d/c passive stretching. Follow up on HEP. Continue with strengthening. Attempt scapular strengthening.    OT Home Exercise Plan eval: shoulder stretches, A/ROM scapular row 2/16: shoulder strengthening    Consulted and Agree with Plan of Care Patient             Patient will benefit from skilled therapeutic intervention in order to improve the following deficits and impairments:   Body Structure / Function / Physical Skills: ADL, UE functional use, Fascial restriction, Pain, ROM, IADL, Strength       Visit Diagnosis: Other symptoms and signs involving the musculoskeletal system  Acute pain of left shoulder  Stiffness of left shoulder, not elsewhere classified    Problem List Patient Active Problem List   Diagnosis Date Noted   Rotator cuff syndrome, left 12/13/2021   Asthma, persistent controlled 07/24/2021   Right carpal tunnel syndrome 06/14/2021   DOE (dyspnea on exertion) 12/13/2020   Pulmonary hypertension due to sleep-disordered breathing ([place]) 12/13/2020   Abdominal bloating 09/22/2020   Constipation 07/06/2020   Fibromyalgia 04/20/2020   Routine cervical smear 04/18/2020   Encounter for gynecological examination with Papanicolaou smear of cervix 04/18/2020   Encounter for colorectal cancer  screening 04/18/2020   Chronic female pelvic pain 04/18/2020   Proteinuria 04/18/2020   Bad odor of urine 04/18/2020   Klebsiella pneumonia ([place]) 01/30/2019   E. coli UTI (urinary tract infection) 57/84/6962   Complicated UTI (urinary tract infection) 01/29/2019   Levoscoliosis 11/20/2017   [place] kidney stones 08/08/2017   GI bleed 05/29/2017   Sepsis due to urinary tract infection ([place]) 05/29/2017   UTI (urinary tract infection) 05/29/2017   Cervicalgia 11/21/2016   Chronic pain syndrome 10/11/2015   Lumbar radiculopathy 07/25/2015   Fibroid 10/13/2013   Adnexal cyst 10/13/2013   PMB (postmenopausal bleeding) 10/06/2013   Unspecified symptom associated with female genital organs 10/06/2013   Hot flashes 10/06/2013   Internal hemorrhoids with other complication 95/28/4132   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   Hip pain 04/09/2013   Hemorrhoids, internal 03/12/2013   Back pain 03/11/2013   Encounter for therapeutic drug monitoring 03/11/2013   Trochanteric bursitis 03/11/2013   Lumbar facet arthropathy 03/11/2013   Unstable angina ([place]) 06/02/2012   Acute on chronic renal insufficiency 06/02/2012   Chest pain 04/22/2012   IBS (irritable bowel syndrome)-DIARRHEA 01/23/2012   Morbid obesity ([place]) 08/09/2011   Diabetes mellitus ([place]) 08/09/2011   DIARRHEA 10/11/2010   Chronic diastolic heart failure ([place]) 12/16/2007   Pulmonary hypertension ([place]) 12/12/2007   OSTEOARTHRITIS 12/11/2007   OSA on CPAP 12/11/2007    Ailene Ravel, OTR/L,CBIS  [phone]  02/15/2022, 3:39 PM  [place] [place] [address] [place], [place], [address] Phone: [phone]   Fax:  [phone]  Name: Sara Tran MRN: [id number] Date of Birth: December 31, 1956

## 2022-02-15 NOTE — Patient Instructions (Addendum)
Complete 10-12 times. 1 time a day with low weight (ie. 1lb to start with or water bottle/can of soup  FREE WEIGHT FLEXION IN NEUTRAL ROTATION  Start with your arms down by your side. While holding a free weight with your palm facing your side and your elbow straight, raise up your arms forward as shown then return to starting position and repeat.     FREE WEIGHT - BILATERAL SCAPTION  Hold a free weight in both hands and then raise them both up away from your side in a forward/lateral direction. Your elbows should be straight and the movement should occur in the plane of the scapula or 45 degrees to the side as shown.     Standing Shoulder External Rotation  Starting position: Stand with elbows bent to 90 degrees and upright posture. Weights in hands with neutral wrist alignment (thumbs toward the ceiling) End Position: Pull shoulder blades back, and rotate weights out away from body. Elbow angle and wrist angle should not change. Return to starting position       Chest Press  Sit in a chair with your head up and back straight. Start with your elbows bent holding the weights at your chest. Push the weights straight out in front of you until your arms are straight. Pull the weights back slowly to the starting position.       Horizontal abduction/adduction             Begin with arms straight out in front of you, bring out to the side in at "T" shape. Keep arms straight entire time.

## 2022-02-20 ENCOUNTER — Encounter (HOSPITAL_COMMUNITY): Payer: Medicare HMO

## 2022-02-22 ENCOUNTER — Other Ambulatory Visit: Payer: Self-pay

## 2022-02-22 ENCOUNTER — Other Ambulatory Visit: Payer: Self-pay | Admitting: Physical Medicine & Rehabilitation

## 2022-02-22 ENCOUNTER — Encounter (HOSPITAL_COMMUNITY): Payer: Self-pay

## 2022-02-22 ENCOUNTER — Ambulatory Visit (HOSPITAL_COMMUNITY): Payer: Medicare HMO

## 2022-02-22 DIAGNOSIS — M47816 Spondylosis without myelopathy or radiculopathy, lumbar region: Secondary | ICD-10-CM

## 2022-02-22 DIAGNOSIS — R29898 Other symptoms and signs involving the musculoskeletal system: Secondary | ICD-10-CM

## 2022-02-22 DIAGNOSIS — M797 Fibromyalgia: Secondary | ICD-10-CM

## 2022-02-22 DIAGNOSIS — R2689 Other abnormalities of gait and mobility: Secondary | ICD-10-CM | POA: Diagnosis not present

## 2022-02-22 DIAGNOSIS — M25612 Stiffness of left shoulder, not elsewhere classified: Secondary | ICD-10-CM

## 2022-02-22 DIAGNOSIS — M545 Low back pain, unspecified: Secondary | ICD-10-CM | POA: Diagnosis not present

## 2022-02-22 DIAGNOSIS — M6281 Muscle weakness (generalized): Secondary | ICD-10-CM | POA: Diagnosis not present

## 2022-02-22 DIAGNOSIS — M25512 Pain in left shoulder: Secondary | ICD-10-CM

## 2022-02-22 DIAGNOSIS — G894 Chronic pain syndrome: Secondary | ICD-10-CM

## 2022-02-22 NOTE — Patient Instructions (Signed)
1) (Home) Extension: Isometric / Bilateral Arm Retraction - Sitting   Facing anchor, hold hands and elbow at shoulder height, with elbow bent.  Pull arms back to squeeze shoulder blades together. Repeat 10-12 times. 1-2 times/day.   2) (Clinic) Extension / Flexion (Assist)   Face anchor, pull arms back, keeping elbow straight, and squeze shoulder blades together. Repeat 10-12 times. 1-2 times/day.   Copyright  VHI. All rights reserved.   3) (Home) Retraction: Row - Bilateral (Anchor)   Facing anchor, arms reaching forward, pull hands toward stomach, keeping elbows bent and at your sides and pinching shoulder blades together. Repeat 10-12 times. 1 times/day.   Copyright  VHI. All rights reserved.

## 2022-02-22 NOTE — Therapy (Signed)
[facility] [address] [place], [place], [address] Phone: [phone]   Fax:  [phone]  Occupational Therapy Treatment  Patient Details  Name: Sara Tran MRN: [id number] Date of Birth: 01/29/1956 Referring Provider (OT): Alger Simons, MD   Encounter Date: 02/22/2022   OT End of Session - 02/22/22 1512     Visit Number 5    Number of Visits 12    Date for OT Re-Evaluation 02/28/22    Authorization Type [employer]    Authorization Time Period 12 visits approved 01/25/22-03/08/22    Authorization - Visit Number 4    Authorization - Number of Visits 12    Progress Note Due on Visit 10    OT Start Time 1440   pt arrived late   OT Stop Time 1510    OT Time Calculation (min) 30 min    Activity Tolerance Patient tolerated treatment well    Behavior During Therapy WFL for tasks assessed/performed             Past Medical History:  Diagnosis Date   Adnexal cyst    Right simple cyst seen on [place]   Anginal pain (HCC)    Anxiety    Arthritis    Asthma    Atrial fibrillation (HCC)    hx of    CHF (congestive heart failure) (HCC)    Chronic back pain    Chronic hip pain    COPD (chronic obstructive pulmonary disease) ([place])    Coronary artery disease    stents placed    Dyspnea    GERD (gastroesophageal reflux disease)    Headache    due to pinched nerve damaage    Heart murmur    hx of slight murmur    History of bronchitis    History of cardiac catheterization    Normal coronaries 2013   History of kidney stones    Hot flashes    Hypertension    Hypertriglyceridemia    IBS (irritable bowel syndrome)    Internal hemorrhoids    Morbid obesity ([place])    s/p lap band surgery   Myocardial infarction ([place]) 12/2018   times 3   Obesity    Pulmonary HTN ([place])    [place] 2008:  RA pressures 13/10 with a mean of 7 mmHg.  RV pressure 94/07 with end diastolic pressure of 15 mmHg.  PA pressure 49/23 with a mean of 35 mmHg.   Pulmonary capillary wedge 17/50 with a mean of 14 mmHg. The PA saturation was 68%.  RA saturation was 71% and aortic saturation was 90%.  Cardiac output was 6.0 with a cardiac index of 2.80 by [place].  Normal coronaries by cath 2008.   Renal insufficiency    Sciatica of right side    Sleep apnea    CPAP use does not know settings    Trauma    Type 2 diabetes mellitus ([place])    type II    Uterine fibroid     Past Surgical History:  Procedure Laterality Date   accupuncture     APPENDECTOMY     CARDIAC CATHETERIZATION  07/2007, 05/2012   Normal coronary arteries   COLONOSCOPY  10/18/2010   SLF:2-mm sessile sigmoid polyp/diverticula, tortuous colon.  Biopsies of sigmoid colon polyp and random colon biopsies benign   COLONOSCOPY WITH PROPOFOL N/A 08/09/2020   Procedure: COLONOSCOPY WITH PROPOFOL;  Surgeon: Eloise Harman, DO;  Location: [facility];  Service: Endoscopy;  Laterality: N/A;  11:00am   CYSTOSCOPY/URETEROSCOPY/HOLMIUM LASER/STENT PLACEMENT Right 08/01/2017   Procedure: CYSTOSCOPY/ RIGHT URETEROSCOPY/ RIGHT RETROGRADE/ HOLMIUM LASER  AND RIGHT STENT PLACEMENT FIRST STAGE;  Surgeon: Irine Seal, MD;  Location: [facility];  Service: Urology;  Laterality: Right;   CYSTOSCOPY/URETEROSCOPY/HOLMIUM LASER/STENT PLACEMENT Right 08/08/2017   Procedure: RIGHT URETEROSCOPY WITH HOLMIUM LASER RIGHT STENT PLACEMENT;  Surgeon: Irine Seal, MD;  Location: [facility];  Service: Urology;  Laterality: Right;   EXTRACORPOREAL SHOCK WAVE LITHOTRIPSY Right 08/22/2017   Procedure: RIGHT EXTRACORPOREAL SHOCK WAVE LITHOTRIPSY (ESWL);  Surgeon: Alexis Frock, MD;  Location: [facility];  Service: Urology;  Laterality: Right;   HEMORRHOID BANDING  07/2012   Dr. Oneida Alar   LAPAROSCOPIC CHOLECYSTECTOMY  1985   LAPAROSCOPIC GASTRIC BANDING  12/12/2010   LEFT AND RIGHT HEART CATHETERIZATION WITH CORONARY ANGIOGRAM N/A 06/03/2012   Procedure: LEFT AND RIGHT HEART CATHETERIZATION WITH CORONARY ANGIOGRAM;  Surgeon: Jolaine Artist, MD;  Location: [facility] CATH LAB;  Service: Cardiovascular;  Laterality: N/A;   PILONIDAL CYST EXCISION  1974   RIGHT/LEFT HEART CATH AND CORONARY ANGIOGRAPHY N/A 03/28/2020   Procedure: RIGHT/LEFT HEART CATH AND CORONARY ANGIOGRAPHY;  Surgeon: Jolaine Artist, MD;  Location: [facility] CV LAB;  Service: Cardiovascular;  Laterality: N/A;   TUMOR EXCISION  10/2011   Left thigh   TUMOR EXCISION  10/2011   Face    There were no vitals filed for this visit.   Subjective Assessment - 02/22/22 1443     Subjective  S: Last session had me laid up for 2 days.    Currently in Pain? Yes    Pain Score 6     Pain Location Shoulder    Pain Orientation Left    Pain Descriptors / Indicators Constant;Sore    Pain Type Acute pain    Pain Onset More than a month ago    Pain Frequency Constant    Aggravating Factors  activity    Pain Relieving Factors rest, pain medication, stretches    Effect of Pain on Daily Activities mod-max                [facility] OT Assessment - 02/22/22 1447       Assessment   Medical Diagnosis Left shoulder pain/bursitis      Precautions   Precautions None    Precaution Comments history right shoulder RTC tear, decreased sensation in bilateral hand finger tips.                      OT Treatments/Exercises (OP) - 02/22/22 1447       Exercises   Exercises Shoulder      Shoulder Exercises: Seated   Extension Theraband;10 reps    Theraband Level (Shoulder Extension) Level 1 (Yellow)    Retraction Theraband;10 reps    Theraband Level (Shoulder Retraction) Level 1 (Yellow)    Row Theraband;10 reps    Theraband Level (Shoulder Row) Level 1 (Yellow)    Protraction AROM;5 reps    Horizontal ABduction AROM;5 reps    External Rotation AROM;5 reps    Internal Rotation AROM;5 reps    Flexion AROM;5 reps    Abduction AROM;5 reps      Shoulder Exercises: ROM/Strengthening   UBE (Upper Arm Bike) level 1 2' forward   pace: 2.5                    OT Education - 02/22/22 1511     Education Details scapular  strengthening - yellow band    Person(s) Educated Patient    Methods Explanation;Demonstration;Verbal cues;Handout;Tactile cues    Comprehension Verbalized understanding;Returned demonstration;Tactile cues required;Verbal cues required              OT Short Term Goals - 01/25/22 1311       OT SHORT TERM GOAL #1   Title Patient will be educated and independent with HEP in order to increase functional use of her LUE and be able to use it for all required self care tasks such as dressing with less difficulty.    Time 6    Period Weeks    Status On-going    Target Date 02/28/22      OT SHORT TERM GOAL #2   Title Patient will increase her LUE A/ROM (IR/er) to [facility] in order to complete lower body dressing tasks (ie. manipulating pants) with decreased difficulty.    Time 6    Period Weeks    Status On-going      OT SHORT TERM GOAL #3   Title Patient will increase her LUE strength to 4+/5 with greater scapular stability and decreased muscle breaking while being tested.    Time 6    Period Weeks    Status On-going      OT SHORT TERM GOAL #4   Title Patient will report a decreased pain level of approximately 5/10 or less when utilizing her left arm to complete functional reaching tasks at or above her shoulder level.    Time 6    Period Weeks    Status On-going      OT SHORT TERM GOAL #5   Title Pt will decrease her LUE fascial restrictions to a minimal amount or less in order to increase the functional mobility needed to complete reaching tasks.    Time 6    Period Weeks    Status On-going                      Plan - 02/22/22 1531     Clinical Impression Statement A: Due to time constraint, myofascial release was not completed. Pt reports pain with external rotation strengthening per HEP. Reviewed exercise to ensure correct form and technique. patient will try it again at home.  Completed scapular strengthening with yellow band. Did require mod-max VC for form and technique. Worked on slow and controlled. Added UBE bike for strength and endurance.    Body Structure / Function / Physical Skills ADL;UE functional use;Fascial restriction;Pain;ROM;IADL;Strength    Plan P: If time allows may need myofascial release. D/C passive stretching. Follow up on HEP. Review strengthening with 1#.             Patient will benefit from skilled therapeutic intervention in order to improve the following deficits and impairments:   Body Structure / Function / Physical Skills: ADL, UE functional use, Fascial restriction, Pain, ROM, IADL, Strength       Visit Diagnosis: Other symptoms and signs involving the musculoskeletal system  Stiffness of left shoulder, not elsewhere classified  Acute pain of left shoulder    Problem List Patient Active Problem List   Diagnosis Date Noted   Rotator cuff syndrome, left 12/13/2021   Asthma, persistent controlled 07/24/2021   Right carpal tunnel syndrome 06/14/2021   DOE (dyspnea on exertion) 12/13/2020   Pulmonary hypertension due to sleep-disordered breathing (HCC) 12/13/2020   Abdominal bloating 09/22/2020   Constipation 07/06/2020   Fibromyalgia 04/20/2020   Routine cervical  smear 04/18/2020   Encounter for gynecological examination with Papanicolaou smear of cervix 04/18/2020   Encounter for colorectal cancer screening 04/18/2020   Chronic female pelvic pain 04/18/2020   Proteinuria 04/18/2020   Bad odor of urine 04/18/2020   Klebsiella pneumonia ([place]) 01/30/2019   E. coli UTI (urinary tract infection) 22/33/6122   Complicated UTI (urinary tract infection) 01/29/2019   Levoscoliosis 11/20/2017   [place] kidney stones 08/08/2017   GI bleed 05/29/2017   Sepsis due to urinary tract infection ([place]) 05/29/2017   UTI (urinary tract infection) 05/29/2017   Cervicalgia 11/21/2016   Chronic pain syndrome 10/11/2015   Lumbar  radiculopathy 07/25/2015   Fibroid 10/13/2013   Adnexal cyst 10/13/2013   PMB (postmenopausal bleeding) 10/06/2013   Unspecified symptom associated with female genital organs 10/06/2013   Hot flashes 10/06/2013   Internal hemorrhoids with other complication 44/97/5300   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   Hip pain 04/09/2013   Hemorrhoids, internal 03/12/2013   Back pain 03/11/2013   Encounter for therapeutic drug monitoring 03/11/2013   Trochanteric bursitis 03/11/2013   Lumbar facet arthropathy 03/11/2013   Unstable angina ([place]) 06/02/2012   Acute on chronic renal insufficiency 06/02/2012   Chest pain 04/22/2012   IBS (irritable bowel syndrome)-DIARRHEA 01/23/2012   Morbid obesity ([place]) 08/09/2011   Diabetes mellitus ([place]) 08/09/2011   DIARRHEA 10/11/2010   Chronic diastolic heart failure ([place]) 12/16/2007   Pulmonary hypertension ([place]) 12/12/2007   OSTEOARTHRITIS 12/11/2007   OSA on CPAP 12/11/2007    Ailene Ravel, OTR/L,CBIS  [phone]  02/22/2022, 3:35 PM  [facility] [address] [place], [place], [address] Phone: [phone]   Fax:  [phone]  Name: Sara Tran MRN: [id number] Date of Birth: [birth year]

## 2022-02-27 ENCOUNTER — Encounter (HOSPITAL_COMMUNITY): Payer: Self-pay | Admitting: Occupational Therapy

## 2022-02-27 ENCOUNTER — Ambulatory Visit (HOSPITAL_COMMUNITY): Payer: Medicare HMO | Admitting: Occupational Therapy

## 2022-02-27 ENCOUNTER — Other Ambulatory Visit: Payer: Self-pay

## 2022-02-27 DIAGNOSIS — M6281 Muscle weakness (generalized): Secondary | ICD-10-CM | POA: Diagnosis not present

## 2022-02-27 DIAGNOSIS — R2689 Other abnormalities of gait and mobility: Secondary | ICD-10-CM | POA: Diagnosis not present

## 2022-02-27 DIAGNOSIS — M545 Low back pain, unspecified: Secondary | ICD-10-CM | POA: Diagnosis not present

## 2022-02-27 DIAGNOSIS — M25612 Stiffness of left shoulder, not elsewhere classified: Secondary | ICD-10-CM

## 2022-02-27 DIAGNOSIS — M25512 Pain in left shoulder: Secondary | ICD-10-CM | POA: Diagnosis not present

## 2022-02-27 DIAGNOSIS — R29898 Other symptoms and signs involving the musculoskeletal system: Secondary | ICD-10-CM

## 2022-02-27 NOTE — Patient Instructions (Addendum)
Theraband strengthening: Complete 10-15X, 1-2X/day  1) Shoulder protraction  Anchor band in doorway, stand with back to door. Push your hand forward as much as you can to bringing your shoulder blades forward on your rib cage.      2) Shoulder horizontal abduction  Standing with a theraband anchored at chest height, begin with arm straight and some tension in the band. Move your arm out to your side (keeping straight the whole time). Bring the affected arm back to midline.     3) Shoulder Internal Rotation  While holding an elastic band at your side with your elbow bent, start with your hand away from your stomach, then pull the band towards your stomach. Keep your elbow near your side the entire time.     4) Shoulder External Rotation  While holding an elastic band at your side with your elbow bent, start with your hand near your stomach and then pull the band away. Keep your elbow at your side the entire time.     5) Shoulder flexion  While standing with back to the door, holding Theraband at hand level, raise arm in front of you.  Keep elbow straight through entire movement.      6) Shoulder abduction  While holding an elastic band at your side, draw up your arm to the side keeping your elbow straight.        Stretch:   1) Towel Stretch with Internal Rotation      Gently pull up (or to the side) your affected arm  behind your back with the assist of a towel. Hold 10 seconds, repeat 3-5 times. 1-2 times/day.

## 2022-02-27 NOTE — Therapy (Signed)
[place], [place], [address] Phone: [phone]   Fax:  [phone]  Occupational Therapy Reassessment, Treatment, Discharge Summary  Patient Details  Name: Sara Tran MRN: [id number] Date of Birth: 08/03/1956 Referring Provider (OT): Alger Simons, MD   Encounter Date: 02/27/2022   OT End of Session - 02/27/22 1421     Visit Number 6    Number of Visits 12    Date for OT Re-Evaluation 02/28/22    Authorization Type [employer]    Authorization Time Period 12 visits approved 01/25/22-03/08/22    Authorization - Visit Number 5    Authorization - Number of Visits 12    Progress Note Due on Visit 10    OT Start Time 2831    OT Stop Time 1420    OT Time Calculation (min) 32 min    Activity Tolerance Patient tolerated treatment well    Behavior During Therapy WFL for tasks assessed/performed             Past Medical History:  Diagnosis Date   Adnexal cyst    Right simple cyst seen on [place]   Anginal pain (HCC)    Anxiety    Arthritis    Asthma    Atrial fibrillation (HCC)    hx of    CHF (congestive heart failure) (HCC)    Chronic back pain    Chronic hip pain    COPD (chronic obstructive pulmonary disease) ([place])    Coronary artery disease    stents placed    Dyspnea    GERD (gastroesophageal reflux disease)    Headache    due to pinched nerve damaage    Heart murmur    hx of slight murmur    History of bronchitis    History of cardiac catheterization    Normal coronaries 2013   History of kidney stones    Hot flashes    Hypertension    Hypertriglyceridemia    IBS (irritable bowel syndrome)    Internal hemorrhoids    Morbid obesity ([place])    s/p lap band surgery   Myocardial infarction ([place]) 12/2018   times 3   Obesity    Pulmonary HTN ([place])    [place] 2008:  RA pressures 13/10 with a mean of 7 mmHg.  RV pressure 51/76 with end diastolic pressure of 15 mmHg.  PA pressure 49/23 with a  mean of 35 mmHg.  Pulmonary capillary wedge 17/50 with a mean of 14 mmHg. The PA saturation was 68%.  RA saturation was 71% and aortic saturation was 90%.  Cardiac output was 6.0 with a cardiac index of 2.80 by Fick.  Normal coronaries by cath 2008.   Renal insufficiency    Sciatica of right side    Sleep apnea    CPAP use does not know settings    Trauma    Type 2 diabetes mellitus ([place])    type II    Uterine fibroid     Past Surgical History:  Procedure Laterality Date   accupuncture     APPENDECTOMY     CARDIAC CATHETERIZATION  07/2007, 05/2012   Normal coronary arteries   COLONOSCOPY  10/18/2010   SLF:2-mm sessile sigmoid polyp/diverticula, tortuous colon.  Biopsies of sigmoid colon polyp and random colon biopsies benign   COLONOSCOPY WITH PROPOFOL N/A 08/09/2020   Procedure: COLONOSCOPY WITH PROPOFOL;  Surgeon: Eloise Harman, DO;  [facility];  Service: Endoscopy;  Laterality:  N/A;  11:00am   CYSTOSCOPY/URETEROSCOPY/HOLMIUM LASER/STENT PLACEMENT Right 08/01/2017   Procedure: CYSTOSCOPY/ RIGHT URETEROSCOPY/ RIGHT RETROGRADE/ HOLMIUM LASER  AND RIGHT STENT PLACEMENT FIRST STAGE;  Surgeon: Irine Seal, MD;  Location: [facility];  Service: Urology;  Laterality: Right;   CYSTOSCOPY/URETEROSCOPY/HOLMIUM LASER/STENT PLACEMENT Right 08/08/2017   Procedure: RIGHT URETEROSCOPY WITH HOLMIUM LASER RIGHT STENT PLACEMENT;  Surgeon: Irine Seal, MD;  Location: [facility];  Service: Urology;  Laterality: Right;   EXTRACORPOREAL SHOCK WAVE LITHOTRIPSY Right 08/22/2017   Procedure: RIGHT EXTRACORPOREAL SHOCK WAVE LITHOTRIPSY (ESWL);  Surgeon: Alexis Frock, MD;  Location: [facility];  Service: Urology;  Laterality: Right;   HEMORRHOID BANDING  07/2012   Dr. Oneida Alar   LAPAROSCOPIC CHOLECYSTECTOMY  1985   LAPAROSCOPIC GASTRIC BANDING  12/12/2010   LEFT AND RIGHT HEART CATHETERIZATION WITH CORONARY ANGIOGRAM N/A 06/03/2012   Procedure: LEFT AND RIGHT HEART CATHETERIZATION WITH CORONARY ANGIOGRAM;  Surgeon:  Jolaine Artist, MD;  Location: [facility] CATH LAB;  Service: Cardiovascular;  Laterality: N/A;   PILONIDAL CYST EXCISION  1974   RIGHT/LEFT HEART CATH AND CORONARY ANGIOGRAPHY N/A 03/28/2020   Procedure: RIGHT/LEFT HEART CATH AND CORONARY ANGIOGRAPHY;  Surgeon: Jolaine Artist, MD;  Location: [facility] CV LAB;  Service: Cardiovascular;  Laterality: N/A;   TUMOR EXCISION  10/2011   Left thigh   TUMOR EXCISION  10/2011   Face    There were no vitals filed for this visit.   Subjective Assessment - 02/27/22 1346     Subjective  S: It's a little better I think.    Currently in Pain? Yes    Pain Score 4     Pain Location Shoulder    Pain Orientation Right    Pain Descriptors / Indicators Sore    Pain Type Acute pain    Pain Radiating Towards shoulder to elbow    Pain Onset More than a month ago    Pain Frequency Intermittent    Aggravating Factors  activity    Pain Relieving Factors rest, pain medication, stretches    Effect of Pain on Daily Activities mod effect on ADLs    Multiple Pain Sites No                [facility] OT Assessment - 02/27/22 1352       Assessment   Medical Diagnosis Left shoulder pain/bursitis      Precautions   Precautions None    Precaution Comments history right shoulder RTC tear, decreased sensation in bilateral hand finger tips.      Observation/Other Assessments   Focus on Therapeutic Outcomes (FOTO)  59/100   45/100 previous     AROM   Overall AROM Comments Assessed seated. IR/er abducted    AROM Assessment Site Shoulder    Right/Left Shoulder Left    Left Shoulder Flexion 166 Degrees   155 previous   Left Shoulder ABduction 153 Degrees   150 previous   Left Shoulder Internal Rotation 32 Degrees   30 previous   Left Shoulder External Rotation 80 Degrees   40 previous     Strength   Overall Strength Comments Assessed seated IR/er adducted. Muscle strength initially functional then quickly gave.    Strength Assessment Site Shoulder     Right/Left Shoulder Left    Left Shoulder Flexion 4/5   same as previous   Left Shoulder ABduction 4/5   same as previous   Left Shoulder Internal Rotation 4+/5   4-/5 previous   Left Shoulder External Rotation 4+/5  4-/5 previous                     OT Treatments/Exercises (OP) - 02/27/22 1420       Exercises   Exercises Shoulder      Shoulder Exercises: Standing   Protraction Theraband;10 reps    Theraband Level (Shoulder Protraction) Level 2 (Red)    Horizontal ABduction Theraband;10 reps    Theraband Level (Shoulder Horizontal ABduction) Level 2 (Red)    External Rotation Theraband;10 reps    Theraband Level (Shoulder External Rotation) Level 2 (Red)    Internal Rotation Theraband;10 reps    Theraband Level (Shoulder Internal Rotation) Level 2 (Red)    Flexion Theraband;10 reps    Theraband Level (Shoulder Flexion) Level 2 (Red)    ABduction Theraband;10 reps    Theraband Level (Shoulder ABduction) Level 2 (Red)      Shoulder Exercises: Stretch   Internal Rotation Stretch 3 reps   10" holds horizontal towel                   OT Education - 02/27/22 1403     Education Details red theraband strengthening; IR stretch behind back with towel    Person(s) Educated Patient    Methods Explanation;Demonstration;Handout    Comprehension Verbalized understanding;Returned demonstration              OT Short Term Goals - 02/27/22 1357       OT SHORT TERM GOAL #1   Title Patient will be educated and independent with HEP in order to increase functional use of her LUE and be able to use it for all required self care tasks such as dressing with less difficulty.    Time 6    Period Weeks    Status Achieved    Target Date 02/28/22      OT SHORT TERM GOAL #2   Title Patient will increase her LUE A/ROM (IR/er) to [facility] in order to complete lower body dressing tasks (ie. manipulating pants) with decreased difficulty.    Time 6    Period Weeks    Status  Partially Met      OT SHORT TERM GOAL #3   Title Patient will increase her LUE strength to 4+/5 with greater scapular stability and decreased muscle breaking while being tested.    Baseline 2/28: dropping with abduction    Time 6    Period Weeks    Status Achieved      OT SHORT TERM GOAL #4   Title Patient will report a decreased pain level of approximately 5/10 or less when utilizing her left arm to complete functional reaching tasks at or above her shoulder level.    Time 6    Period Weeks    Status Achieved      OT SHORT TERM GOAL #5   Title Pt will decrease her LUE fascial restrictions to a minimal amount or less in order to increase the functional mobility needed to complete reaching tasks.    Time 6    Period Weeks    Status Achieved                      Plan - 02/27/22 1421     Clinical Impression Statement A: Reassessment completed this session, pt has met 4/5 goals with remaining goal partially met. Pt reports she is trying to use her arm during ADLs and does note improved mobility, continues to have some pain  with certain movements. Pt is demonstrating ROM and strength WFL, does have decreased activity tolerance with sustained MMT. Pt is agreeable to discharge today with HEP for continued LUE strengthening.    Body Structure / Function / Physical Skills ADL;UE functional use;Fascial restriction;Pain;ROM;IADL;Strength    Plan P: Discharge pt    OT Home Exercise Plan eval: shoulder stretches, A/ROM scapular row 2/16: shoulder strengthening; 2/28: red theraband strengthening    Consulted and Agree with Plan of Care Patient             Patient will benefit from skilled therapeutic intervention in order to improve the following deficits and impairments:   Body Structure / Function / Physical Skills: ADL, UE functional use, Fascial restriction, Pain, ROM, IADL, Strength       Visit Diagnosis: Other symptoms and signs involving the musculoskeletal  system  Stiffness of left shoulder, not elsewhere classified  Acute pain of left shoulder    Problem List Patient Active Problem List   Diagnosis Date Noted   Rotator cuff syndrome, left 12/13/2021   Asthma, persistent controlled 07/24/2021   Right carpal tunnel syndrome 06/14/2021   DOE (dyspnea on exertion) 12/13/2020   Pulmonary hypertension due to sleep-disordered breathing (HCC) 12/13/2020   Abdominal bloating 09/22/2020   Constipation 07/06/2020   Fibromyalgia 04/20/2020   Routine cervical smear 04/18/2020   Encounter for gynecological examination with Papanicolaou smear of cervix 04/18/2020   Encounter for colorectal cancer screening 04/18/2020   Chronic female pelvic pain 04/18/2020   Proteinuria 04/18/2020   Bad odor of urine 04/18/2020   Klebsiella pneumonia ([place]) 01/30/2019   E. coli UTI (urinary tract infection) 36/62/9476   Complicated UTI (urinary tract infection) 01/29/2019   Levoscoliosis 11/20/2017   Staghorn kidney stones 08/08/2017   GI bleed 05/29/2017   Sepsis due to urinary tract infection ([place]) 05/29/2017   UTI (urinary tract infection) 05/29/2017   Cervicalgia 11/21/2016   Chronic pain syndrome 10/11/2015   Lumbar radiculopathy 07/25/2015   Fibroid 10/13/2013   Adnexal cyst 10/13/2013   PMB (postmenopausal bleeding) 10/06/2013   Unspecified symptom associated with female genital organs 10/06/2013   Hot flashes 10/06/2013   Internal hemorrhoids with other complication 54/65/0354   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   Hip pain 04/09/2013   Hemorrhoids, internal 03/12/2013   Back pain 03/11/2013   Encounter for therapeutic drug monitoring 03/11/2013   Trochanteric bursitis 03/11/2013   Lumbar facet arthropathy 03/11/2013   Unstable angina (HCC) 06/02/2012   Acute on chronic renal insufficiency 06/02/2012   Chest pain 04/22/2012   IBS (irritable bowel syndrome)-DIARRHEA 01/23/2012   Morbid obesity ([place]) 08/09/2011   Diabetes mellitus  ([place]) 08/09/2011   DIARRHEA 10/11/2010   Chronic diastolic heart failure ([place]) 12/16/2007   Pulmonary hypertension ([place]) 12/12/2007   OSTEOARTHRITIS 12/11/2007   OSA on CPAP 12/11/2007    Guadelupe Sabin, OTR/L  [phone] 02/27/2022, 3:14 PM  [place], [place], [address] Phone: [phone]   Fax:  [phone]  Name: RONNE SAVOIA MRN: [id number] Date of Birth: [birth year]   OCCUPATIONAL THERAPY DISCHARGE SUMMARY  Visits from Start of Care: 6  Current functional level related to goals / functional outcomes: See above. Pt demonstrates improved functional use of LUE, reports continued pain however has generalized pain at baseline.    Remaining deficits: Decreased strength and activity tolerance   Education / Equipment: HEP for continued LUE strengthening and IR stretching   Patient agrees to discharge. Patient goals  were met. Patient is being discharged due to meeting the stated rehab goals.Marland Kitchen

## 2022-02-28 ENCOUNTER — Ambulatory Visit (HOSPITAL_COMMUNITY): Payer: Medicare HMO | Admitting: Physical Therapy

## 2022-03-01 ENCOUNTER — Ambulatory Visit (HOSPITAL_COMMUNITY): Payer: Medicare HMO

## 2022-03-01 ENCOUNTER — Encounter (HOSPITAL_COMMUNITY): Payer: Medicare HMO

## 2022-03-05 ENCOUNTER — Other Ambulatory Visit: Payer: Self-pay

## 2022-03-05 ENCOUNTER — Encounter (HOSPITAL_COMMUNITY): Payer: Medicare HMO | Admitting: Internal Medicine

## 2022-03-05 NOTE — Patient Outreach (Signed)
[facility]) Care Management  03/05/2022  Sara Tran 03/24/1956 [id number]   Telephone Assessment  Successful outreach call placed to patient.  Social: Patient continue to reside in her home alone. She is independent with ADLs/IADLs. Denies nay recent falls. She drives herself to medical appts and reports he has several upcoming appts. Appetite remains good and wgt stable.    Conditions: Per chart review, patient has PMH that includes but not limited to DM COPD, OSA and obesity.    Medications Reviewed Today     Reviewed by Hayden Pedro, RN (Registered Nurse) on 03/05/22 at [place] List Status: <None>   Medication Order Taking? Sig Documenting Provider Last Dose Status Informant  albuterol (PROVENTIL HFA;VENTOLIN HFA) 108 (90 BASE) MCG/ACT inhaler [id number] No Inhale 2 puffs into the lungs every 6 (six) hours as needed for wheezing or shortness of breath.  [provider] Taking Active Self  albuterol (PROVENTIL) (2.5 MG/3ML) 0.083% nebulizer solution [id number] No Take 2.5 mg by nebulization every 4 (four) hours as needed for wheezing or shortness of breath.  [provider] Taking Active Self           Med Note [name], JESSICA R   Tue Jul 23, 2017  2:17 PM)    Alcohol Swabs (B-D SINGLE USE SWABS REGULAR) PADS [id number] No  [provider] Taking Active   ALPRAZolam (XANAX) 0.5 MG tablet [id number] No Take 0.5 mg by mouth daily as needed for anxiety.  [provider] Taking Active Self  amLODipine (NORVASC) 10 MG tablet [id number] No Take 10 mg by mouth every evening.  [provider] Taking Active Self  Ascorbic Acid (VITAMIN C) 500 MG CAPS [id number] No Take 500 mg by mouth daily.  [provider] Taking Active Self  baclofen (LIORESAL) 10 MG tablet [id number]  TAKE 1-2 TABLETS (10-20 MG TOTAL) 4 (FOUR) TIMES DAILY. Meredith Staggers, MD  Active   Blood Glucose Calibration (TRUE METRIX LEVEL 2) Normal  SOLN [id number] No  [provider] Taking Active   cholecalciferol (VITAMIN D3) 25 MCG (1000 UNIT) tablet [id number] No Take 1,000 Units by mouth daily. [provider] Taking Active Self  clobetasol (TEMOVATE) 0.05 % external solution [id number] No Apply 1 application topically daily as needed (scalp irritation).  [provider] Taking Active Self  EPINEPHrine 0.3 mg/0.3 mL IJ SOAJ injection [id number] No Inject 0.3 mg into the muscle as needed for anaphylaxis.  [provider] Taking Active Self           Med Note [name], [name]   Wed Jul 06, 2020  1:23 PM)    fenofibrate 160 MG tablet [id number] No Take 160 mg by mouth daily. [provider] Taking Active   fluticasone (FLONASE) 50 MCG/ACT nasal spray [id number] No Place 1 spray into both nostrils daily.  [provider] Taking Active Self  furosemide (LASIX) 40 MG tablet [id number] No Take 40 mg by mouth daily as needed. [provider] Taking Active Self  gabapentin (NEURONTIN) 600 MG tablet [id number] No Take 1 tablet (600 mg total) by mouth 4 (four) times daily. Meredith Staggers, MD Taking Active   isosorbide mononitrate (IMDUR) 30 MG 24 hr tablet [id number] No Take 30 mg by mouth daily.  [provider] Taking Active Self  levocetirizine (XYZAL) 5 MG tablet [id number] No Take 5 mg by mouth in the morning. [provider] Taking Active   linaclotide Rolan Lipa) 72 MCG capsule [id number] No Take  72 mcg by mouth daily before breakfast. [provider] Taking Active Self  losartan-hydrochlorothiazide (HYZAAR) 100-25 MG tablet [id number] No Take 1 tablet by mouth daily. [provider] Taking Active Self  magnesium oxide (MAG-OX) 400 MG tablet [id number] No Take 400 mg by mouth daily.  [provider] Taking Active Self  methylPREDNISolone (MEDROL DOSEPAK) 4 MG TBPK tablet [id number] No  [provider] Taking Active   Multiple Vitamin  (MULTIVITAMIN WITH MINERALS) TABS tablet [id number] No Take 1 tablet by mouth daily. [provider] Taking Active Self  nitroGLYCERIN (NITROSTAT) 0.4 MG SL tablet [id number] No Place 0.4 mg under the tongue every 5 (five) minutes as needed for chest pain.  [provider] Taking Active Self  ondansetron (ZOFRAN) 4 MG tablet [id number] No Take 4 mg by mouth every 8 (eight) hours as needed for nausea or vomiting.  [provider] Taking Active Self  oxybutynin (DITROPAN-XL) 10 MG 24 hr tablet [id number] No Take 1 tablet (10 mg total) by mouth daily. Irine Seal, MD Taking Active   oxyCODONE-acetaminophen (PERCOCET) 10-325 MG tablet [id number] No Take 1 tablet by mouth 4 (four) times daily as needed for pain. Meredith Staggers, MD Taking Active            Med Note Casilda Carls   Tue Nov 21, 2021  2:17 PM) Not here for count. Last taken today. BHJ 11/21/21  potassium chloride SA (K-DUR) 20 MEQ tablet [id number] No Take 20 mEq by mouth daily.  [provider] Taking Active Self  pravastatin (PRAVACHOL) 40 MG tablet [id number] No  [provider] Taking Active   SYMBICORT 160-4.5 MCG/ACT inhaler [id number] No Inhale 2 puffs into the lungs 2 (two) times daily. Rigoberto Noel, MD Taking Active   TRUE METRIX BLOOD GLUCOSE TEST test strip [id number] No  [provider] Taking Active   TRUEplus Lancets 33G Sanders [id number] No  [provider] Taking Active   TRULICITY 1.5 IW/9.7LG SOPN [id number] No Inject 1.5 mg into the skin every 7 (seven) days.  [provider] Taking Active Self           Med Note [facility], [name]   Wed Mar 16, 2020  2:10 PM)    vitamin B-12 (CYANOCOBALAMIN) 1000 MCG tablet [id number] No Take 1,000 mcg by mouth daily. [provider] Taking Active Self  Vitamin D, Ergocalciferol, (DRISDOL) 1.25 MG (50000 UNIT) CAPS capsule [id number] No Take 50,000 Units by mouth every 7 (seven) days.  [provider]  Taking Active Self           SDOH Screenings   Alcohol Screen: Not on file  Depression (PHQ2-9): Low Risk    PHQ-2 Score: 0  Financial Resource Strain: Not on file  Food Insecurity: No Food Insecurity   Worried About Running Out of Food in the Last Year: Never true   Ran Out of Food in the Last Year: Never true  Housing: Not on file  Physical Activity: Not on file  Social Connections: Not on file  Stress: Not on file  Tobacco Use: Medium Risk   Smoking Tobacco Use: Former   Smokeless Tobacco Use: Never   Passive Exposure: Not on file  Transportation Needs: No Transportation Needs   Lack of Transportation (Medical): No   Lack of Transportation (Non-Medical): No   Fall Risk 08/16/2021 09/26/2021 11/15/2021 12/05/2021 03/05/2022  Falls in the past year? 0 1 0 1 0  Number of falls in past year - - -  8/22 -  Was there an injury with Fall? 0 0 0 0 0  Fall Risk Category Calculator 0 1 0 1 0  Fall Risk Category Low Low Low Low Low  Patient Fall Risk Level Low fall risk - Low fall risk - Low fall risk  Patient at Risk for Falls Due to Medication side effect - Medication side effect History of fall(s) Medication side effect  Patient at Risk for Falls Due to - - - - -  Fall risk Follow up Falls evaluation completed - Falls evaluation completed - Falls evaluation completed   Depression screen [facility] 2/9 03/05/2022 12/05/2021 09/26/2021 03/20/2021 12/08/2020  Decreased Interest 0 0 0 0 0  Down, Depressed, Hopeless 0 0 0 0 0  PHQ - 2 Score 0 0 0 0 0  Altered sleeping - - - - -  Tired, decreased energy - - - - -  Change in appetite - - - - -  Feeling bad or failure about yourself  - - - - -  Trouble concentrating - - - - -  Moving slowly or fidgety/restless - - - - -  Suicidal thoughts - - - - -  PHQ-9 Score - - - - -  Difficult doing work/chores - - - - -  Some recent data might be hidden    Care Plan : Diabetes Type 2 (Adult)  Updates made by Hayden Pedro, RN since 03/05/2022  12:00 AM     Problem: Disease Progression (Diabetes, Type 2)      Long-Range Goal: Disease Progression Prevented or Minimized-Patient will maintain A1C level of less than 7   Start Date: 03/20/2021  Expected End Date: 12/30/2023  This Visit's Progress: On track  Recent Progress: On track  Priority: High  Note:   Current Barriers:  Chronic Disease Management support and education needs related to COPD and DMII   RNCM Clinical Goal(s):  Patient will verbalize understanding of plan for management of COPD and DMII as evidenced by mgmt of chronic conditions demonstrate Ongoing health management independence as evidenced by A1C level WNL parameters continue to work with RN Care Manager to address care management and care coordination needs related to  COPD and DMII as evidenced by adherence to CM Team Scheduled appointments through collaboration with RN Care manager, provider, and care team.   Interventions: POC sent to PCP upon initial assessment, quarterly and with any changes in patient's conditions Inter-disciplinary care team collaboration (see longitudinal plan of care) Evaluation of current treatment plan related to  self management and patient's adherence to plan as established by provider   COPD Interventions:  (Status:  Goal on track:  Yes.) Long Term Goal Advised patient to track and manage COPD triggers Discussed the importance of adequate rest and management of fatigue with COPD   Diabetes Interventions:  (Status:  Goal on track:  Yes.) Long Term Goal Assessed patient's understanding of A1c goal: <7% Provided education to patient about basic DM disease process Reviewed medications with patient and discussed importance of medication adherence Counseled on importance of regular laboratory monitoring as prescribed Lab Results  Component Value Date   HGBA1C 6.3 (H) 01/28/2019   Patient Goals/Self-Care Activities: Take all medications as prescribed Attend all scheduled  provider appointments Call provider office for new concerns or questions  enter blood sugar readings and medication or insulin into daily log set goal weight limit outdoor activity during cold weather do breathing exercises every day follow rescue plan if symptoms flare-up  Follow  Up Plan:  Telephone follow up appointment with care management team member scheduled for:  within the month of June. The patient has been provided with contact information for the care management team and has been advised to call with any health related questions or concerns.       Consent: [facility] services reviewed and discussed with patient. Verbal consent for services given.   Plan: RN CM discussed with patient next outreach within the month of June. Patient agrees to care plan and follow up. RN CM will send quarterly update to PCP.    Enzo Montgomery, RN,BSN,CCM [place] Management Telephonic Care Management Coordinator Direct Phone: [phone] Toll Free: [phone] Fax: [phone]

## 2022-03-06 ENCOUNTER — Encounter: Payer: Self-pay | Admitting: Pulmonary Disease

## 2022-03-06 ENCOUNTER — Other Ambulatory Visit: Payer: Self-pay

## 2022-03-06 ENCOUNTER — Encounter (HOSPITAL_COMMUNITY): Payer: Medicare HMO

## 2022-03-06 ENCOUNTER — Ambulatory Visit (INDEPENDENT_AMBULATORY_CARE_PROVIDER_SITE_OTHER): Payer: Medicare HMO | Admitting: Pulmonary Disease

## 2022-03-06 DIAGNOSIS — G4733 Obstructive sleep apnea (adult) (pediatric): Secondary | ICD-10-CM

## 2022-03-06 DIAGNOSIS — J45998 Other asthma: Secondary | ICD-10-CM | POA: Diagnosis not present

## 2022-03-06 DIAGNOSIS — Z9989 Dependence on other enabling machines and devices: Secondary | ICD-10-CM

## 2022-03-06 DIAGNOSIS — I272 Pulmonary hypertension, unspecified: Secondary | ICD-10-CM

## 2022-03-06 MED ORDER — ALBUTEROL SULFATE HFA 108 (90 BASE) MCG/ACT IN AERS: 2.0000 | INHALATION_SPRAY | Freq: Four times a day (QID) | RESPIRATORY_TRACT | 11 refills | Status: DC | PRN

## 2022-03-06 NOTE — Patient Instructions (Addendum)
?  X CPAP titration study ? ?X refills on albuterol MDI  ?

## 2022-03-06 NOTE — Progress Notes (Signed)
? ?  Subjective:  ? ? Patient ID: Sara Tran, female    DOB: 1956/09/09, 66 y.o.   MRN: [id number] ? ?HPI ? ?66 yo remote ex-smoker for FU of OSA ,asthma and mild pulm hypertension ? ?PMH - HFpEF ([place]), DM-2 ? ?Chief Complaint  ?Patient presents with  ? Follow-up  ?  Feels cpap needs different pressure. Having issues with face mask   ? ?Last office visit we gave her an AirFit F30 fullface mask and this is working better but she is wondering if she can have a better mask and in fact wonders about a nasal interface.  She does put on her CPAP machine every night but is unable to keep it on through the night ? ?She had a cold few weeks ago and received a course of prednisone.  Last few days due to increased pollen her breathing is worse and she has been using albuterol twice daily. ? ?Reports mild ankle edema, meds reviewed shows amlodipine ?  ?Significant tests/ events reviewed ?  ? PFT's  03/22/12  FEV1  1.66 (88 % ) ratio 0.87  with DLCO  14.27 (75%) corrects to 5.45 (128%)  for alv volume  ?  ?- RHC  03/28/20  PA  42/17 with mean 29 and wedge 13 and PRV  3.0 wu ?  ?-  12/13/2020   Walked RA  approx   300 ft  @ slow/moderate pace  stopped due to  Sob with sats still 94%    ?  ?NPSG 01/2013 mild, AHI 10/hour, predominantly REM related events, early REM latency ?03/2013 CPAP titration >> 9 cm ? ?Review of Systems ?neg for any significant sore throat, dysphagia, itching, sneezing, nasal congestion or excess/ purulent secretions, fever, chills, sweats, unintended wt loss, pleuritic or exertional cp, hempoptysis, orthopnea pnd or change in chronic leg swelling. Also denies presyncope, palpitations, heartburn, abdominal pain, nausea, vomiting, diarrhea or change in bowel or urinary habits, dysuria,hematuria, rash, arthralgias, visual complaints, headache, numbness weakness or ataxia. ? ?   ?Objective:  ? Physical Exam ? ?Gen. Pleasant, obese, in no distress ?ENT - no lesions, no post nasal drip ?Neck: No JVD, no  thyromegaly, no carotid bruits ?Lungs: no use of accessory muscles, no dullness to percussion, decreased without rales or rhonchi  ?Cardiovascular: Rhythm regular, heart sounds  normal, no murmurs or gallops, no peripheral edema ?Musculoskeletal: No deformities, no cyanosis or clubbing , no tremors ? ? ? ?   ?Assessment & Plan:  ? ? ?

## 2022-03-06 NOTE — Assessment & Plan Note (Signed)
CPAP download was reviewed which shows large leak, average pressure of 12 cm on auto settings 6 to 12 cm and average compliance more than 5 hours but very variable ?We will set up CPAP titration study this will allow for mask desensitization and further better fitting mask and hopefully improve her compliance ?Also adjust to exact pressure required ? ?Weight loss encouraged, compliance with goal of at least 4-6 hrs every night is the expectation. ?Advised against medications with sedative side effects ?Cautioned against driving when sleepy - understanding that sleepiness will vary on a day to day basis ? ?

## 2022-03-06 NOTE — Assessment & Plan Note (Signed)
WHO 2/3 , mild ?Treat underlying cause ?

## 2022-03-06 NOTE — Assessment & Plan Note (Signed)
Refills to be provided on Symbicort and albuterol ?

## 2022-03-08 ENCOUNTER — Encounter (HOSPITAL_COMMUNITY): Payer: Medicare HMO

## 2022-03-08 ENCOUNTER — Ambulatory Visit (HOSPITAL_COMMUNITY)
Admission: RE | Admit: 2022-03-08 | Discharge: 2022-03-08 | Disposition: A | Discharge status: Home / Self Care | Payer: Medicare HMO | Source: Ambulatory Visit | Attending: Internal Medicine | Admitting: Internal Medicine

## 2022-03-08 ENCOUNTER — Other Ambulatory Visit: Payer: Self-pay

## 2022-03-08 ENCOUNTER — Encounter (HOSPITAL_COMMUNITY): Payer: Self-pay | Admitting: Internal Medicine

## 2022-03-08 VITALS — BP 128/74 | HR 75 | Wt 255.8 lb

## 2022-03-08 DIAGNOSIS — I2721 Secondary pulmonary arterial hypertension: Secondary | ICD-10-CM

## 2022-03-08 DIAGNOSIS — R079 Chest pain, unspecified: Secondary | ICD-10-CM | POA: Insufficient documentation

## 2022-03-08 DIAGNOSIS — J449 Chronic obstructive pulmonary disease, unspecified: Secondary | ICD-10-CM | POA: Insufficient documentation

## 2022-03-08 DIAGNOSIS — R0789 Other chest pain: Secondary | ICD-10-CM | POA: Diagnosis not present

## 2022-03-08 DIAGNOSIS — G4730 Sleep apnea, unspecified: Secondary | ICD-10-CM | POA: Insufficient documentation

## 2022-03-08 DIAGNOSIS — I110 Hypertensive heart disease with heart failure: Secondary | ICD-10-CM | POA: Diagnosis not present

## 2022-03-08 DIAGNOSIS — G4733 Obstructive sleep apnea (adult) (pediatric): Secondary | ICD-10-CM | POA: Diagnosis not present

## 2022-03-08 DIAGNOSIS — I10 Essential (primary) hypertension: Secondary | ICD-10-CM | POA: Diagnosis not present

## 2022-03-08 DIAGNOSIS — E119 Type 2 diabetes mellitus without complications: Secondary | ICD-10-CM | POA: Diagnosis not present

## 2022-03-08 DIAGNOSIS — R0600 Dyspnea, unspecified: Secondary | ICD-10-CM | POA: Insufficient documentation

## 2022-03-08 DIAGNOSIS — Z6841 Body Mass Index (BMI) 40.0 and over, adult: Secondary | ICD-10-CM | POA: Insufficient documentation

## 2022-03-08 DIAGNOSIS — E781 Pure hyperglyceridemia: Secondary | ICD-10-CM | POA: Insufficient documentation

## 2022-03-08 DIAGNOSIS — I272 Pulmonary hypertension, unspecified: Secondary | ICD-10-CM | POA: Insufficient documentation

## 2022-03-08 DIAGNOSIS — Z9884 Bariatric surgery status: Secondary | ICD-10-CM | POA: Insufficient documentation

## 2022-03-08 NOTE — Patient Instructions (Signed)
Your physician recommends that you schedule a follow-up appointment in: 1 year, **PLEASE CALL OUR OFFICE IN JAN 2024 TO SCHEDULE THIS APPOINTMENT ? ?If you have any questions or concerns before your next appointment please send [place] a message through [place] or call our office at [phone].   ? ?TO LEAVE A MESSAGE FOR THE NURSE SELECT OPTION 2, PLEASE LEAVE A MESSAGE INCLUDING: ?YOUR NAME ?DATE OF BIRTH ?CALL BACK NUMBER ?REASON FOR CALL**this is important as we prioritize the call backs ? ?YOU WILL RECEIVE A CALL BACK THE SAME DAY AS LONG AS YOU CALL BEFORE 4:00 PM ? ? ?

## 2022-03-08 NOTE — Progress Notes (Signed)
ADVANCED HF CLINIC NOTE   Patient ID: Sara Tran, female   DOB: 1956/11/10, 66 y.o.   MRN: [id number] Primary Care: Dr. Hilma Favors Primary Cardiologist: Dr. Haroldine Laws  Nephrologist: Dr. Hinda Lenis  HPI:  Sara Tran is a 66 y.o.  female with history of mild pulmonary hypertension, diabetes, HF, COPD, sleep apnea with CPAP, elevated triglycerides, renal insufficiency and morbid obesity s/p lap band in 2011 aand CP with normal coronaries by cath in 2013 and 3/21  Echo 10/2017: EF 55-60% RV normal Personally reviewed  In 3/21 was complaining of chest pain and concerned about worsening PAH and/or ischemic heart disease.  Had a repeat R/LHC with normal coronaries and stable mild PAH.   Here for f/u. Main complaint is arthritis in shoulders and knees. Has had some CP. Takes NTG with some relief. Remains SOB with mild activity. Follows with Dr. Elsworth Soho. HAs upcoming sleep study. Gets some edema in her ankles    Cardiac studies:   VQ scan 2008: normal  RHC 2008:  RA pressures 13/10 with a mean of 7 mmHg.  RV pressure 74/25 with end diastolic   Dr. [name] performed PFTs on 03/13/12 which were normal. DLCO is minimally reduced, but is corrected to some extent when considerations of volume are made.  FVC 1.91 (81%), FEV1 (88%), FEV1/FVC 87 (107%), FEF 25-75% 2.09 (103%), DLCO 75%  L/RHC 02/2020:  Ao = 178/79 (119) LV  = 168/16 RA = 9   RV = 41/11 PA = 42/17 (29) PCW = 13 Fick cardiac output/index = 5.3/2.4 PVR = 3.0 WU FA sat = 99% PA sat = 69%, 70%   LHC 05/2012: normal cor  Review of systems complete and found to be negative unless listed in HPI.     Past Medical History:  Diagnosis Date   Adnexal cyst    Right simple cyst seen on [place]   Anginal pain (HCC)    Anxiety    Arthritis    Asthma    Atrial fibrillation (HCC)    hx of    CHF (congestive heart failure) (HCC)    Chronic back pain    Chronic hip pain    COPD (chronic obstructive pulmonary disease) ([place])    Coronary  artery disease    stents placed    Dyspnea    GERD (gastroesophageal reflux disease)    Headache    due to pinched nerve damaage    Heart murmur    hx of slight murmur    History of bronchitis    History of cardiac catheterization    Normal coronaries 2013   History of kidney stones    Hot flashes    Hypertension    Hypertriglyceridemia    IBS (irritable bowel syndrome)    Internal hemorrhoids    Morbid obesity ([place])    s/p lap band surgery   Myocardial infarction ([place]) 12/2018   times 3   Obesity    Pulmonary HTN ([place])    [place] 2008:  RA pressures 13/10 with a mean of 7 mmHg.  RV pressure 95/63 with end diastolic pressure of 15 mmHg.  PA pressure 49/23 with a mean of 35 mmHg.  Pulmonary capillary wedge 17/50 with a mean of 14 mmHg. The PA saturation was 68%.  RA saturation was 71% and aortic saturation was 90%.  Cardiac output was 6.0 with a cardiac index of 2.80 by Fick.  Normal coronaries by cath 2008.   Renal insufficiency    Sciatica of right side  Sleep apnea    CPAP use does not know settings    Trauma    Type 2 diabetes mellitus (HCC)    type II    Uterine fibroid     Current Outpatient Medications  Medication Sig Dispense Refill   albuterol (PROVENTIL) (2.5 MG/3ML) 0.083% nebulizer solution Take 2.5 mg by nebulization every 4 (four) hours as needed for wheezing or shortness of breath.      albuterol (VENTOLIN HFA) 108 (90 Base) MCG/ACT inhaler Inhale 2 puffs into the lungs every 6 (six) hours as needed for wheezing or shortness of breath. 18 g 11   Alcohol Swabs (B-D SINGLE USE SWABS REGULAR) PADS      ALPRAZolam (XANAX) 0.5 MG tablet Take 0.5 mg by mouth daily as needed for anxiety.      amLODipine (NORVASC) 10 MG tablet Take 10 mg by mouth every evening.      Ascorbic Acid (VITAMIN C) 500 MG CAPS Take 500 mg by mouth daily.      baclofen (LIORESAL) 10 MG tablet TAKE 1-2 TABLETS (10-20 MG TOTAL) 4 (FOUR) TIMES DAILY. 150 tablet 3   Blood Glucose Calibration (TRUE  METRIX LEVEL 2) Normal SOLN      Cholecalciferol (VITAMIN D3) 1.25 MG (50000 UT) TABS Patient takes 1 tablet by mouth once a week.     clobetasol (TEMOVATE) 0.05 % external solution Apply 1 application topically daily as needed (scalp irritation).      EPINEPHrine 0.3 mg/0.3 mL IJ SOAJ injection Inject 0.3 mg into the muscle as needed for anaphylaxis.      fenofibrate 160 MG tablet Take 160 mg by mouth daily.     fluticasone (FLONASE) 50 MCG/ACT nasal spray Place 1 spray into both nostrils daily.      furosemide (LASIX) 40 MG tablet Take 40 mg by mouth daily as needed.     gabapentin (NEURONTIN) 600 MG tablet Take 1 tablet (600 mg total) by mouth 4 (four) times daily. 120 tablet 2   isosorbide mononitrate (IMDUR) 30 MG 24 hr tablet Take 30 mg by mouth daily.      levocetirizine (XYZAL) 5 MG tablet Take 5 mg by mouth at bedtime.     linaclotide (LINZESS) 72 MCG capsule Take 72 mcg by mouth daily before breakfast. As needed     losartan-hydrochlorothiazide (HYZAAR) 100-25 MG tablet Take 1 tablet by mouth daily.     magnesium oxide (MAG-OX) 400 MG tablet Take 400 mg by mouth daily.      methylPREDNISolone (MEDROL DOSEPAK) 4 MG TBPK tablet As needed     Multiple Vitamin (MULTIVITAMIN WITH MINERALS) TABS tablet Take 1 tablet by mouth daily.     nitroGLYCERIN (NITROSTAT) 0.4 MG SL tablet Place 0.4 mg under the tongue every 5 (five) minutes as needed for chest pain.      ondansetron (ZOFRAN) 4 MG tablet Take 4 mg by mouth every 8 (eight) hours as needed for nausea or vomiting.      oxybutynin (DITROPAN-XL) 10 MG 24 hr tablet Take 1 tablet (10 mg total) by mouth daily. (Patient taking differently: Take 10 mg by mouth daily. As needed) 30 tablet 11   oxyCODONE-acetaminophen (PERCOCET) 10-325 MG tablet Take 1 tablet by mouth 4 (four) times daily as needed for pain. 30 tablet 0   potassium chloride SA (K-DUR) 20 MEQ tablet Take 20 mEq by mouth daily.      pravastatin (PRAVACHOL) 40 MG tablet Take 40 mg by  mouth daily.  SYMBICORT 160-4.5 MCG/ACT inhaler Inhale 2 puffs into the lungs 2 (two) times daily. 30.6 g 3   TRUE METRIX BLOOD GLUCOSE TEST test strip      TRUEplus Lancets 89F MISC      TRULICITY 1.5 YB/0.1BP SOPN Inject 1.5 mg into the skin every 7 (seven) days.      vitamin B-12 (CYANOCOBALAMIN) 1000 MCG tablet Take 1,000 mcg by mouth daily.     Vitamin D, Ergocalciferol, (DRISDOL) 1.25 MG (50000 UNIT) CAPS capsule Take 50,000 Units by mouth every 7 (seven) days.      No current facility-administered medications for this encounter.    Allergies  Allergen Reactions   Ancef [Cefazolin] Anaphylaxis and Shortness Of Breath        Bee Venom Anaphylaxis    As a child   Meloxicam     Gi upset    Ceftin [Cefuroxime Axetil] Swelling    Swollen tongue   Chicken Meat (Diagnostic) Nausea And Vomiting   Ciprofloxacin Cough    Coughed up blood   Macrodantin [Nitrofurantoin] Other (See Comments)    Vomited blood and had dark stool.   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   Smoking status: Former    Types: Cigarettes    Quit date: 08/08/1996    Years since quitting: 25.5   Smokeless tobacco: Never  Vaping Use   Vaping Use: Never used  Substance and Sexual Activity   Alcohol use: No   Drug use: No   Sexual activity: Yes    Birth control/protection: Post-menopausal  Other Topics Concern   Not on file  Social History Narrative   Not on file    Social Determinants of Health   Financial Resource Strain: Not on file  Food Insecurity: No Food Insecurity   Worried About Running Out of Food in the Last Year: Never true   [place] in the Last Year: Never true  Transportation Needs: No Transportation Needs   Lack of Transportation (Medical): No   Lack of Transportation (Non-Medical): No  Physical Activity: Not on file  Stress: Not on file  Social Connections: Not on file   Family History  Problem Relation Age of Onset   Diabetes Mother    Heart failure Mother    Breast cancer Sister    CAD Father    Hypertension Father    Heart failure Father    Cancer Paternal Grandfather    Hypertension Paternal Grandmother    Stroke Paternal Grandmother    Other Maternal Grandmother        tonsilitis   Diabetes Maternal Grandfather    Hypertension Maternal Grandfather    Breast cancer Paternal Aunt      PHYSICAL EXAM: Vitals:   03/08/22 1514  BP: 128/74  Pulse: 75  SpO2: 95%  Weight: 116 kg (255 lb 12.8 oz)  Wt Readings from Last 3 Encounters:  03/08/22 116 kg (255 lb 12.8 oz)  03/06/22 115.2 kg (254 lb)  02/13/22 115.8 kg (255 lb 6.4 oz)    General:  Obese woman. No resp difficulty HEENT: normal Neck: supple. no JVD. Carotids 2+ bilat; no bruits. No lymphadenopathy or thryomegaly appreciated. Cor: PMI nondisplaced. Regular rate & rhythm. No rubs, gallops or murmurs. Lungs: clear Abdomen: obese soft, nontender, nondistended. No hepatosplenomegaly. No bruits or masses. Good bowel sounds. Extremities: no cyanosis, clubbing, rash, edema Neuro: alert & orientedx3, cranial nerves grossly intact. moves all 4 extremities w/o difficulty. Affect pleasant   ASSESSMENT & PLAN:  1.Chest pain and dyspnea - cath 2013 without CAD - f/u cath 3/21 no CAD. Mild pulmonary HTN - Reassured her that I do not think CP is ischemic  2. Pulmonary hypertension: Patient had mild pulmonary hypertension on RHC in 6/13.  - Echo in  12/2014 with mild RVH and Grade 1 DD, otherwise unremarkable.  - suspect WHO group 2&3 - Cath 3/21 with mild PAH. Suggest weight loss - No role for selective pulmonary vasodilators   3. Morbid obesity - Body mass index is 51.67 kg/m.  - Continue weight loss efforts.  - She is taking GLP1RA  4. OSA - continue CPAP  5. Hypertension - BP high here but well controlled at home. No change  6. DM2 - Per PCP.    Glori Bickers, MD  3:32 PM

## 2022-03-09 DIAGNOSIS — G894 Chronic pain syndrome: Secondary | ICD-10-CM | POA: Diagnosis not present

## 2022-03-09 DIAGNOSIS — E782 Mixed hyperlipidemia: Secondary | ICD-10-CM | POA: Diagnosis not present

## 2022-03-09 DIAGNOSIS — E1129 Type 2 diabetes mellitus with other diabetic kidney complication: Secondary | ICD-10-CM | POA: Diagnosis not present

## 2022-03-09 DIAGNOSIS — I10 Essential (primary) hypertension: Secondary | ICD-10-CM | POA: Diagnosis not present

## 2022-03-09 DIAGNOSIS — E559 Vitamin D deficiency, unspecified: Secondary | ICD-10-CM | POA: Diagnosis not present

## 2022-03-09 DIAGNOSIS — Z6841 Body Mass Index (BMI) 40.0 and over, adult: Secondary | ICD-10-CM | POA: Diagnosis not present

## 2022-03-09 DIAGNOSIS — N183 Chronic kidney disease, stage 3 unspecified: Secondary | ICD-10-CM | POA: Diagnosis not present

## 2022-03-15 DIAGNOSIS — R6889 Other general symptoms and signs: Secondary | ICD-10-CM | POA: Diagnosis not present

## 2022-03-16 ENCOUNTER — Other Ambulatory Visit: Payer: Self-pay | Admitting: Gastroenterology

## 2022-03-19 NOTE — Telephone Encounter (Signed)
Last office visit:  09/22/20

## 2022-03-20 ENCOUNTER — Encounter: Payer: Self-pay | Admitting: Internal Medicine

## 2022-03-20 NOTE — Telephone Encounter (Signed)
Will refill. Patient needs OV for follow up visit. Okay to schedule with [name] or myself ?

## 2022-03-20 NOTE — Telephone Encounter (Signed)
Pt needs ov for further refills.  ?

## 2022-03-21 ENCOUNTER — Encounter: Payer: Medicare HMO | Attending: Physical Medicine & Rehabilitation | Admitting: Physical Medicine & Rehabilitation

## 2022-03-21 ENCOUNTER — Other Ambulatory Visit: Payer: Self-pay

## 2022-03-21 ENCOUNTER — Encounter: Payer: Self-pay | Admitting: Physical Medicine & Rehabilitation

## 2022-03-21 VITALS — BP 153/82 | HR 73 | Ht 59.0 in | Wt 255.2 lb

## 2022-03-21 DIAGNOSIS — M47816 Spondylosis without myelopathy or radiculopathy, lumbar region: Secondary | ICD-10-CM

## 2022-03-21 DIAGNOSIS — M797 Fibromyalgia: Secondary | ICD-10-CM | POA: Diagnosis not present

## 2022-03-21 DIAGNOSIS — M5416 Radiculopathy, lumbar region: Secondary | ICD-10-CM

## 2022-03-21 DIAGNOSIS — M7061 Trochanteric bursitis, right hip: Secondary | ICD-10-CM | POA: Diagnosis not present

## 2022-03-21 NOTE — Patient Instructions (Signed)
PLEASE FEEL FREE TO CALL OUR OFFICE WITH ANY PROBLEMS OR QUESTIONS ([phone])      

## 2022-03-21 NOTE — Progress Notes (Signed)
? ?Subjective:  ? ? Patient ID: Sara Tran, female    DOB: August 29, 1956, 66 y.o.   MRN: [id number] ? ?HPI ? ?Sara Tran is here in follow up of her chronic pain. She is moving better with therapy. She has had visits with PT and OT.  Therapy has been working on her shoulder and her ROM has improved.  ? ?Her right hip still causes her problems. Therapy has been addressing ROM and mechanics with her as well.  ? ?Acupuncture is helping with the swelling in her hand and fingers and is decreasing pain as well. ROM has improved also. She still has tingling and pain at times in hand however. She is not consistently wearing splint.  ? ?She has been working on [employer] and weight loss.  ? ? ?Pain Inventory ?Average Pain 7 ?Pain Right Now 6 ?My pain is sharp, burning, dull, stabbing, tingling, and aching ? ?In the last 24 hours, has pain interfered with the following? ?General activity 5 ?Relation with others 10 ?Enjoyment of life 10 ?What TIME of day is your pain at its worst? morning  ?Sleep (in general) Fair ? ?Pain is worse with: walking, sitting, and some activites ?Pain improves with: rest, medication, and injections ?Relief from Meds: 5 ? ?Family History  ?Problem Relation Age of Onset  ? Diabetes Mother   ? Heart failure Mother   ? Breast cancer Sister   ? CAD Father   ? Hypertension Father   ? Heart failure Father   ? Cancer Paternal Grandfather   ? Hypertension Paternal Grandmother   ? Stroke Paternal Grandmother   ? Other Maternal Grandmother   ?     tonsilitis  ? Diabetes Maternal Grandfather   ? Hypertension Maternal Grandfather   ? Breast cancer Paternal Aunt   ? ?Social History  ? ?Socioeconomic History  ? Marital status: Single  ?  Spouse name: Not on file  ? Number of children: 0  ? Years of education: Not on file  ? Highest education level: Not on file  ?Occupational History  ? Occupation: Volunteers  ? Occupation: Midwife  ?  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 Smoking status:  Former  ?  Types: Cigarettes  ?  Quit date: 08/08/1996  ?  Years since quitting: 25.6  ? Smokeless tobacco: Never  ?Vaping Use  ? Vaping Use: Never used  ?Substance and Sexual Activity  ? Alcohol use: No  ? Drug use: No  ? Sexual activity: Yes  ?  Birth control/protection: Post-menopausal  ?Other Topics Concern  ? Not on file  ?Social History Narrative  ? Not on file  ? ?Social Determinants of Health  ? ?Financial Resource Strain: Not on file  ?Food Insecurity: No Food Insecurity  ? Worried About Charity fundraiser in the Last Year: Never true  ? Ran Out of Food in the Last Year: Never true  ?Transportation Needs: Unknown  ? Lack of Transportation (Medical): No  ? Lack of Transportation (Non-Medical): Not on file  ?Physical Activity: Not on file  ?Stress: Not on file  ?Social Connections: Not on file  ? ?Past Surgical History:  ?Procedure Laterality Date  ? accupuncture    ? APPENDECTOMY    ? CARDIAC CATHETERIZATION  07/2007, 05/2012  ? Normal coronary arteries  ? COLONOSCOPY  10/18/2010  ? SLF:2-mm sessile sigmoid polyp/diverticula, tortuous colon.  Biopsies of sigmoid colon polyp and random colon biopsies benign  ? COLONOSCOPY WITH  PROPOFOL N/A 08/09/2020  ? Procedure: COLONOSCOPY WITH PROPOFOL;  Surgeon: Eloise Harman, DO;  [facility];  Service: Endoscopy;  Laterality: N/A;  11:00am  ? CYSTOSCOPY/URETEROSCOPY/HOLMIUM LASER/STENT PLACEMENT Right 08/01/2017  ? Procedure: CYSTOSCOPY/ RIGHT URETEROSCOPY/ RIGHT RETROGRADE/ HOLMIUM LASER  AND RIGHT STENT PLACEMENT FIRST STAGE;  Surgeon: Irine Seal, MD;  Location: [facility];  Service: Urology;  Laterality: Right;  ? CYSTOSCOPY/URETEROSCOPY/HOLMIUM LASER/STENT PLACEMENT Right 08/08/2017  ? Procedure: RIGHT URETEROSCOPY WITH HOLMIUM LASER RIGHT STENT PLACEMENT;  Surgeon: Irine Seal, MD;  Location: [facility];  Service: Urology;  Laterality: Right;  ? EXTRACORPOREAL SHOCK WAVE LITHOTRIPSY Right 08/22/2017  ? Procedure: RIGHT EXTRACORPOREAL SHOCK WAVE LITHOTRIPSY (ESWL);   Surgeon: Alexis Frock, MD;  Location: [facility];  Service: Urology;  Laterality: Right;  ? HEMORRHOID BANDING  07/2012  ? Dr. Oneida Alar  ? [place]  ? LAPAROSCOPIC GASTRIC BANDING  12/12/2010  ? LEFT AND RIGHT HEART CATHETERIZATION WITH CORONARY ANGIOGRAM N/A 06/03/2012  ? Procedure: LEFT AND RIGHT HEART CATHETERIZATION WITH CORONARY ANGIOGRAM;  Surgeon: Jolaine Artist, MD;  Location: [facility] CATH LAB;  Service: Cardiovascular;  Laterality: N/A;  ? PILONIDAL CYST EXCISION  1974  ? RIGHT/LEFT HEART CATH AND CORONARY ANGIOGRAPHY N/A 03/28/2020  ? Procedure: RIGHT/LEFT HEART CATH AND CORONARY ANGIOGRAPHY;  Surgeon: Jolaine Artist, MD;  Location: [place] CV LAB;  Service: Cardiovascular;  Laterality: N/A;  ? TUMOR EXCISION  10/2011  ? Left thigh  ? TUMOR EXCISION  10/2011  ? Face  ? ?Past Surgical History:  ?Procedure Laterality Date  ? accupuncture    ? APPENDECTOMY    ? CARDIAC CATHETERIZATION  07/2007, 05/2012  ? Normal coronary arteries  ? COLONOSCOPY  10/18/2010  ? SLF:2-mm sessile sigmoid polyp/diverticula, tortuous colon.  Biopsies of sigmoid colon polyp and random colon biopsies benign  ? COLONOSCOPY WITH PROPOFOL N/A 08/09/2020  ? Procedure: COLONOSCOPY WITH PROPOFOL;  Surgeon: Eloise Harman, DO;  [facility];  Service: Endoscopy;  Laterality: N/A;  11:00am  ? CYSTOSCOPY/URETEROSCOPY/HOLMIUM LASER/STENT PLACEMENT Right 08/01/2017  ? Procedure: CYSTOSCOPY/ RIGHT URETEROSCOPY/ RIGHT RETROGRADE/ HOLMIUM LASER  AND RIGHT STENT PLACEMENT FIRST STAGE;  Surgeon: Irine Seal, MD;  [facility];  Service: Urology;  Laterality: Right;  ? CYSTOSCOPY/URETEROSCOPY/HOLMIUM LASER/STENT PLACEMENT Right 08/08/2017  ? Procedure: RIGHT URETEROSCOPY WITH HOLMIUM LASER RIGHT STENT PLACEMENT;  Surgeon: Irine Seal, MD;  [facility];  Service: Urology;  Laterality: Right;  ? EXTRACORPOREAL SHOCK WAVE LITHOTRIPSY Right 08/22/2017  ? Procedure: RIGHT EXTRACORPOREAL SHOCK WAVE LITHOTRIPSY  (ESWL);  Surgeon: Alexis Frock, MD;  [facility];  Service: Urology;  Laterality: Right;  ? HEMORRHOID BANDING  07/2012  ? Dr. Oneida Alar  ? [place]  ? LAPAROSCOPIC GASTRIC BANDING  12/12/2010  ? LEFT AND RIGHT HEART CATHETERIZATION WITH CORONARY ANGIOGRAM N/A 06/03/2012  ? Procedure: LEFT AND RIGHT HEART CATHETERIZATION WITH CORONARY ANGIOGRAM;  Surgeon: Jolaine Artist, MD;  Location: [facility] CATH LAB;  Service: Cardiovascular;  Laterality: N/A;  ? PILONIDAL CYST EXCISION  1974  ? RIGHT/LEFT HEART CATH AND CORONARY ANGIOGRAPHY N/A 03/28/2020  ? Procedure: RIGHT/LEFT HEART CATH AND CORONARY ANGIOGRAPHY;  Surgeon: Jolaine Artist, MD;  Location: [place] CV LAB;  Service: Cardiovascular;  Laterality: N/A;  ? TUMOR EXCISION  10/2011  ? Left thigh  ? TUMOR EXCISION  10/2011  ? Face  ? ?Past Medical History:  ?Diagnosis Date  ? Adnexal cyst   ? Right simple cyst seen on [place]  ?  Anginal pain ([place])   ? Anxiety   ? Arthritis   ? Asthma   ? Atrial fibrillation ([place])   ? hx of   ? CHF (congestive heart failure) ([place])   ? Chronic back pain   ? Chronic hip pain   ? COPD (chronic obstructive pulmonary disease) ([place])   ? Coronary artery disease   ? stents placed   ? Dyspnea   ? GERD (gastroesophageal reflux disease)   ? Headache   ? due to pinched nerve damaage   ? Heart murmur   ? hx of slight murmur   ? History of bronchitis   ? History of cardiac catheterization   ? Normal coronaries 2013  ? History of kidney stones   ? Hot flashes   ? Hypertension   ? Hypertriglyceridemia   ? IBS (irritable bowel syndrome)   ? Internal hemorrhoids   ? Morbid obesity ([place])   ? s/p lap band surgery  ? Myocardial infarction ([place]) 12/2018  ? times 3  ? Obesity   ? Pulmonary HTN ([place])   ? [place] 2008:  RA pressures 13/10 with a mean of 7 mmHg.  RV pressure 77/41 with end diastolic pressure of 15 mmHg.  PA pressure 49/23 with a mean of 35 mmHg.  Pulmonary capillary wedge 17/50 with a mean of 14 mmHg. The PA saturation  was 68%.  RA saturation was 71% and aortic saturation was 90%.  Cardiac output was 6.0 with a cardiac index of 2.80 by Fick.  Normal coronaries by cath 2008.  ? Renal insufficiency   ? Sciatica of right

## 2022-04-03 ENCOUNTER — Encounter: Payer: Self-pay | Admitting: Physical Medicine & Rehabilitation

## 2022-04-03 ENCOUNTER — Encounter: Payer: Medicare HMO | Attending: Physical Medicine & Rehabilitation | Admitting: Physical Medicine & Rehabilitation

## 2022-04-03 VITALS — BP 161/78 | HR 75 | Ht 59.0 in | Wt 256.5 lb

## 2022-04-03 DIAGNOSIS — G5601 Carpal tunnel syndrome, right upper limb: Secondary | ICD-10-CM | POA: Diagnosis not present

## 2022-04-03 DIAGNOSIS — R6889 Other general symptoms and signs: Secondary | ICD-10-CM | POA: Diagnosis not present

## 2022-04-03 NOTE — Patient Instructions (Signed)
Acupuncture ?Acupuncture is a type of treatment that involves stimulating specific points on your body by inserting thin needles through your skin. Acupuncture is often used to treat pain, but it may also be used to help relieve other types of symptoms. Your health care provider may recommend acupuncture to help treat various conditions, such as: ?Migraine headaches. ?Tension headaches. ?Arthritis pain. ?Addiction. ?Chronic pain. ?Nausea and vomiting after a surgery. ?High blood pressure (hypertension). ?Chronic obstructive pulmonary disease (COPD). ?Nausea caused by cancer treatment. ?Sudden or severe (acute) pain. ?Acupuncture is based on traditional [place] medicine, which recognizes more than 2,000 points on the body that connect energy pathways (meridians) through the body. The goal in stimulating these points is to balance the physical, emotional, and mental energy in your body. Acupuncture is done by a health care provider who has specialized training (licensed acupuncture practitioner). ?Treatment often requires several acupuncture sessions. You may have acupuncture along with other medical treatments. ?Tell a health care provider about: ?Any allergies you have. ?All medicines you are taking, including vitamins, herbs, eye drops, creams, and over-the-counter medicines. ?Any blood disorders you have. ?Any surgeries you have had. ?Any medical conditions you have. ?Whether you are pregnant or may be pregnant. ?What are the risks? ?Generally, this is a safe treatment. However, problems may occur, including: ?Skin infection. ?Damage to organs or structures that are under the skin where a needle is placed. ?What happens before the treatment? ?Your acupuncture practitioner will ask about your medical history and your symptoms. ?You may have a physical exam. ?What happens during the treatment? ?The exact procedure will depend on your condition and how your acupuncture provider treats it. In general: ?Your skin will  be cleaned with a germ-killing (antiseptic) solution. ?Your acupuncture practitioner will open a new set of germ-free (sterile) needles. ?The needles will be gently inserted into your skin. They will be left in place for a certain amount of time. You may feel slight pain or a tingling sensation. ?Your acupuncture practitioner may: ?Apply electrical energy to the needles. ?Adjust the needles in certain ways. ?After your procedure, the acupuncture practitioner will remove the needles, throw them away, and clean your skin. ?The procedure may vary among health care providers. ?What can I expect after the treatment? ?People react differently to acupuncture. Make sure you ask your acupuncture provider what to expect after your treatment. It is common to have: ?Minor bruising. ?Mild pain. ?A small amount of bleeding. ?Follow these instructions at home: ?Follow any instructions given by your provider after the treatment. ?Keep all follow-up visits as told by your health care provider. This is important. ?Contact a health care provider if: ?You have questions about your reaction to the treatment. ?You have soreness. ?You have skin irritation or redness. ?You have a fever. ?Summary ?Acupuncture is a type of treatment that involves stimulating specific points on your body by inserting thin needles through your skin. ?This treatment is often used to treat pain, but it may also be used to help relieve other types of symptoms. ?The exact procedure will depend on your condition and how your acupuncture provider treats it. ?This information is not intended to replace advice given to you by your health care provider. Make sure you discuss any questions you have with your health care provider. ?Document Revised: 06/08/2021 Document Reviewed: 12/01/2020 ?Elsevier Patient Education ? [place]. ? ?

## 2022-04-03 NOTE — Progress Notes (Signed)
Acupuncture treatment #2 (for 2023) ?Last treatment 11/21/2021 ? ?Needles placed at Th4-Th5 T8  as well as MH 6 and MH 7 electrical stimulation at '100Hz'$  between [place] 8 and MH6 as well as between Th5 and MH 7 ?Patient tolerated procedure well ?Post procedure instructions given ?Total treatment time 25 minutes ?

## 2022-04-17 DIAGNOSIS — R69 Illness, unspecified: Secondary | ICD-10-CM | POA: Diagnosis not present

## 2022-05-01 ENCOUNTER — Encounter: Payer: Medicare HMO | Attending: Physical Medicine & Rehabilitation | Admitting: Physical Medicine & Rehabilitation

## 2022-05-01 ENCOUNTER — Encounter: Payer: Self-pay | Admitting: Physical Medicine & Rehabilitation

## 2022-05-01 VITALS — BP 132/77 | HR 69 | Temp 98.0°F | Ht 59.0 in | Wt 250.0 lb

## 2022-05-01 DIAGNOSIS — G5601 Carpal tunnel syndrome, right upper limb: Secondary | ICD-10-CM | POA: Insufficient documentation

## 2022-05-01 NOTE — Patient Instructions (Signed)
Acupuncture Acupuncture is a type of treatment that involves stimulating specific points on your body by inserting thin needles through your skin. Acupuncture is often used to treat pain, but it may also be used to help relieve other types of symptoms. Your health care provider may recommend acupuncture to help treat various conditions, such as: Migraine and tension headaches. Nausea and vomiting after a surgery or cancer treatment. Sudden or severe (acute) pain, or long-term (chronic) pain. Addiction. Acupuncture is based on traditional Chinese medicine, which recognizes more than 2,000 points on the body that connect energy pathways (meridians) through the body. The goal in stimulating these points is to balance the physical, emotional, and mental energy in your body. Acupuncture is done by a health care provider who has specialized training (licensed acupuncture practitioner). Treatment often requires several acupuncture sessions. You may have acupuncture along with other medical treatments. Tell a health care provider about: Any allergies you have. All medicines you are taking, including vitamins, herbs, eye drops, creams, and over-the-counter medicines. Any blood disorders you have. Any surgeries you have had. Any medical conditions you have. Whether you are pregnant or may be pregnant. What are the risks? Generally, this is a safe treatment. However, problems may occur, including: Skin infection. Damage to organs or structures that are under the skin if a needle is placed too deeply. This is rare. What happens before the treatment? Your acupuncture practitioner will ask about your medical history and your symptoms. You may have a physical exam. What happens during the treatment? The exact procedure will depend on your condition and how your acupuncture provider treats it. In general: Your skin will be cleaned with a germ-killing (antiseptic) solution. Your acupuncture practitioner will  open a new set of germ-free (sterile) needles. The needles will be gently inserted into your skin. They will be left in place for a certain amount of time. You may feel a tingling or burning sensation for a very short period of time. Your acupuncture practitioner may: Apply electrical energy to the needles. Adjust the needles in certain ways. After your procedure, the acupuncture practitioner will remove the needles, throw them away, and clean your skin. The procedure may vary among health care providers. What can I expect after the treatment? People react differently to acupuncture. Make sure you ask your acupuncture provider what to expect after your treatment. It is common to have: Minor bruising. Mild pain. A small amount of bleeding. Follow these instructions at home: Follow any instructions given by your provider after the treatment. Keep all follow-up visits. This is important. Where to find more information [facility]: www.nccih.nih.gov Contact a health care provider if: You have questions about your reaction to the treatment. You have soreness. You have skin irritation or redness. You have a fever. Summary Acupuncture is a type of treatment that involves stimulating specific points on your body by inserting thin needles through your skin. This treatment is often used to treat pain, but it may also be used to help relieve other types of symptoms. The exact procedure will depend on your condition and how your acupuncture provider treats it. This information is not intended to replace advice given to you by your health care provider. Make sure you discuss any questions you have with your health care provider. Document Revised: 08/23/2021 Document Reviewed: 08/23/2021 Elsevier Patient Education  2023 [employer]  

## 2022-05-01 NOTE — Progress Notes (Signed)
Acupuncture treatment #3 (for 2023) ?Last treatment 11/21/2021 ? ?Needles placed at Th4-Th5 T8  as well as MH 6 and MH 7 electrical stimulation at '150Hz'$  between [place] 8 and MH6 as well as between Th5 and MH 7 ?Patient tolerated procedure well ?Post procedure instructions given ?Total treatment time 25 minutes ?

## 2022-05-02 DIAGNOSIS — G8929 Other chronic pain: Secondary | ICD-10-CM | POA: Diagnosis not present

## 2022-05-05 DIAGNOSIS — Z9989 Dependence on other enabling machines and devices: Secondary | ICD-10-CM | POA: Diagnosis not present

## 2022-05-05 DIAGNOSIS — G4733 Obstructive sleep apnea (adult) (pediatric): Secondary | ICD-10-CM

## 2022-05-07 ENCOUNTER — Ambulatory Visit: Payer: Medicare HMO | Attending: Pulmonary Disease | Admitting: Pulmonary Disease

## 2022-05-07 DIAGNOSIS — G4733 Obstructive sleep apnea (adult) (pediatric): Secondary | ICD-10-CM | POA: Insufficient documentation

## 2022-05-07 DIAGNOSIS — Z9989 Dependence on other enabling machines and devices: Secondary | ICD-10-CM | POA: Insufficient documentation

## 2022-05-07 DIAGNOSIS — G4731 Primary central sleep apnea: Secondary | ICD-10-CM | POA: Diagnosis present

## 2022-05-08 NOTE — Procedures (Signed)
Patient Name: Sara Tran, Sara Tran ?Study Date: 05/07/2022 ?Gender: Female ?D.O.B: December 11, 1956 ?Age (years): 31 ?Referring Provider: Kara Mead MD, ABSM ?Height (inches): 59 ?Interpreting Physician: Kara Mead MD, ABSM ?Weight (lbs): 254 ?RPSGT: Rosebud Poles ?BMI: 51 ?MRN: [id number] ?Neck Size: 16.50 ?<br> <br> ?CLINICAL INFORMATION ?The patient is referred for a PAP titration to treat sleep apnea. ? ? ?SLEEP STUDY TECHNIQUE ?As per the AASM Manual for the Scoring of Sleep and Associated Events v2.3 (April 2016) with a hypopnea requiring 4% desaturations. ? ?The channels recorded and monitored were frontal, central and occipital EEG, electrooculogram (EOG), submentalis EMG (chin), nasal and oral airflow, thoracic and abdominal wall motion, anterior tibialis EMG, snore microphone, electrocardiogram, and pulse oximetry. Bilevel positive airway pressure (BPAP) was initiated at the beginning of the study and titrated to treat sleep-disordered breathing. ? ?MEDICATIONS ?Medications self-administered by patient taken the night of the study : N/A ? ?RESPIRATORY PARAMETERS ?Optimal IPAP Pressure (cm):  ?AHI at Optimal Pressure (/hr) N/A ?Optimal EPAP Pressure (cm):  ? ? ?Overall Minimal O2 (%): 82.00 Minimal O2 at Optimal Pressure (%): 68.00 ?SLEEP ARCHITECTURE ?Start Time: 10:34:53 PM Stop Time: 5:48:38 AM Total Time (min): 433.8 Total Sleep Time (min): 389 ?Sleep Latency (min): 0.3 Sleep Efficiency (%): 89.7 REM Latency (min): 317.5 WASO (min): 44.5 ?Stage N1 (%): 3.21 Stage N2 (%): 46.92 Stage N3 (%): 48.46 Stage R (%): 1.4 ?Supine (%): 0.00 Arousal Index (/hr): 6.9  ? ? ? ?CARDIAC DATA ?The 2 lead EKG demonstrated sinus rhythm. The mean heart rate was 71.39 beats per minute. Other EKG findings include: PVCs. ?LEG MOVEMENT DATA ?The total Periodic Limb Movements of Sleep (PLMS) were 0. The PLMS index was 0.00. A PLMS index of <15 is considered normal in adults. ? ?IMPRESSIONS ?- An optimal PAP pressure could not be selected  for this patient based on the available study data. Central apneas emerged on CPAP 4 cm & persisted on CPAP 8 & BiPAP upto 12/6 ?- Severe Central Sleep Apnea was noted during this titration (CAI = 47.5/h). ?- Severe oxygen desaturations were observed during this titration (min O2 = 82.00%). ?- The patient snored with soft snoring volume. ?- 2-lead EKG demonstrated: PVCs ?- Clinically significant periodic limb movements were not noted during this study. Arousals associated with PLMs were rare. ? ? ?DIAGNOSIS ?- Obstructive Sleep Apnea (G47.33) ?- Treatment emergent central apneas ? ? ?RECOMMENDATIONS ?- Recommend a trial of ASV therapy if central apneas persist on CPAP download ?- Avoid alcohol, sedatives and other CNS depressants that may worsen sleep apnea and disrupt normal sleep architecture. ?- Sleep hygiene should be reviewed to assess factors that may improve sleep quality. ?- Weight management and regular exercise should be initiated or continued. ?- [facility] to [facility] for [facility]-evaluation after 4 weeks of therapy ? ? ?Kara Mead MD ?Board Certified in Sleep medicine ? ?

## 2022-05-09 ENCOUNTER — Ambulatory Visit: Payer: Medicare HMO | Admitting: Internal Medicine

## 2022-05-10 ENCOUNTER — Other Ambulatory Visit: Payer: Self-pay | Admitting: Physical Medicine & Rehabilitation

## 2022-05-10 DIAGNOSIS — M47816 Spondylosis without myelopathy or radiculopathy, lumbar region: Secondary | ICD-10-CM

## 2022-05-10 DIAGNOSIS — G894 Chronic pain syndrome: Secondary | ICD-10-CM

## 2022-05-10 DIAGNOSIS — M797 Fibromyalgia: Secondary | ICD-10-CM

## 2022-05-18 ENCOUNTER — Telehealth: Payer: Self-pay | Admitting: Pulmonary Disease

## 2022-05-18 NOTE — Telephone Encounter (Signed)
Called and notified patient that SS results werent read yet but we would call her once we had results. She states she has been having headaches and blackouts. Advised patient to call PCP and notify them of situation since it just started happening. Patient will call back if she needs anything further from pulmonary before we contact her about SS results/

## 2022-05-22 ENCOUNTER — Encounter: Payer: Self-pay | Admitting: Pulmonary Disease

## 2022-05-24 ENCOUNTER — Telehealth: Payer: Self-pay | Admitting: Pulmonary Disease

## 2022-05-24 NOTE — Telephone Encounter (Signed)
Called and spoke to patient about results. Further documentation in result note for cpap titration study. Nothing further needed. Patient asked for response to question about results to be sent to her through mychart.

## 2022-05-29 ENCOUNTER — Encounter: Payer: Self-pay | Admitting: Physical Medicine & Rehabilitation

## 2022-05-29 ENCOUNTER — Encounter: Payer: Medicare HMO | Admitting: Physical Medicine & Rehabilitation

## 2022-05-29 VITALS — BP 163/78 | HR 84 | Ht 59.0 in | Wt 250.0 lb

## 2022-05-29 DIAGNOSIS — G5601 Carpal tunnel syndrome, right upper limb: Secondary | ICD-10-CM | POA: Diagnosis not present

## 2022-05-29 DIAGNOSIS — R6889 Other general symptoms and signs: Secondary | ICD-10-CM | POA: Diagnosis not present

## 2022-05-29 NOTE — Patient Instructions (Signed)
Acupuncture Acupuncture is a type of treatment that involves stimulating specific points on your body by inserting thin needles through your skin. Acupuncture is often used to treat pain, but it may also be used to help relieve other types of symptoms. Your health care provider may recommend acupuncture to help treat various conditions, such as: Migraine and tension headaches. Nausea and vomiting after a surgery or cancer treatment. Sudden or severe (acute) pain, or long-term (chronic) pain. Addiction. Acupuncture is based on traditional Chinese medicine, which recognizes more than 2,000 points on the body that connect energy pathways (meridians) through the body. The goal in stimulating these points is to balance the physical, emotional, and mental energy in your body. Acupuncture is done by a health care provider who has specialized training (licensed acupuncture practitioner). Treatment often requires several acupuncture sessions. You may have acupuncture along with other medical treatments. Tell a health care provider about: Any allergies you have. All medicines you are taking, including vitamins, herbs, eye drops, creams, and over-the-counter medicines. Any blood disorders you have. Any surgeries you have had. Any medical conditions you have. Whether you are pregnant or may be pregnant. What are the risks? Generally, this is a safe treatment. However, problems may occur, including: Skin infection. Damage to organs or structures that are under the skin if a needle is placed too deeply. This is rare. What happens before the treatment? Your acupuncture practitioner will ask about your medical history and your symptoms. You may have a physical exam. What happens during the treatment? The exact procedure will depend on your condition and how your acupuncture provider treats it. In general: Your skin will be cleaned with a germ-killing (antiseptic) solution. Your acupuncture practitioner will  open a new set of germ-free (sterile) needles. The needles will be gently inserted into your skin. They will be left in place for a certain amount of time. You may feel a tingling or burning sensation for a very short period of time. Your acupuncture practitioner may: Apply electrical energy to the needles. Adjust the needles in certain ways. After your procedure, the acupuncture practitioner will remove the needles, throw them away, and clean your skin. The procedure may vary among health care providers. What can I expect after the treatment? People react differently to acupuncture. Make sure you ask your acupuncture provider what to expect after your treatment. It is common to have: Minor bruising. Mild pain. A small amount of bleeding. Follow these instructions at home: Follow any instructions given by your provider after the treatment. Keep all follow-up visits. This is important. Where to find more information [facility]: www.nccih.nih.gov Contact a health care provider if: You have questions about your reaction to the treatment. You have soreness. You have skin irritation or redness. You have a fever. Summary Acupuncture is a type of treatment that involves stimulating specific points on your body by inserting thin needles through your skin. This treatment is often used to treat pain, but it may also be used to help relieve other types of symptoms. The exact procedure will depend on your condition and how your acupuncture provider treats it. This information is not intended to replace advice given to you by your health care provider. Make sure you discuss any questions you have with your health care provider. Document Revised: 08/23/2021 Document Reviewed: 08/23/2021 Elsevier Patient Education  2023 [employer]  

## 2022-05-29 NOTE — Progress Notes (Signed)
Acupuncture treatment #4 (for 2023) Last treatment 11/21/2021  Needles placed at Th4-Th5 T8  as well as MH 6 and MH 7 electrical stimulation at '150Hz'$  between [place] 8 and MH6 as well as between Th5 and MH 7 Patient tolerated procedure well Post procedure instructions given Total treatment time 25 minutes

## 2022-05-31 ENCOUNTER — Encounter: Payer: Self-pay | Admitting: Internal Medicine

## 2022-05-31 ENCOUNTER — Ambulatory Visit: Payer: Medicare HMO | Admitting: Internal Medicine

## 2022-05-31 DIAGNOSIS — R140 Abdominal distension (gaseous): Secondary | ICD-10-CM | POA: Diagnosis not present

## 2022-05-31 DIAGNOSIS — K5904 Chronic idiopathic constipation: Secondary | ICD-10-CM | POA: Diagnosis not present

## 2022-05-31 MED ORDER — LINACLOTIDE 72 MCG PO CAPS: 72.0000 ug | ORAL_CAPSULE | Freq: Every day | ORAL | 3 refills | Status: AC

## 2022-05-31 NOTE — Progress Notes (Signed)
Referring Provider: Sharilyn Sites, MD Primary Care Physician:  Sharilyn Sites, MD Primary GI:  Dr. Abbey Chatters  Chief Complaint  Patient presents with   Follow-up    constipation    HPI:   Sara Tran is a 66 y.o. female who presents to the clinic today for follow-up visit.  She has longstanding history of constipation.  Patient is chronically on opioids.    Last colonoscopy 08/09/20, her colon was not prepped properly so procedure was aborted.  Last colonoscopy in 2011 showed tortuous colon, diverticulosis, one sigmoid polyp which was benign.  Random colon biopsies were negative for microscopic colitis at that time.   For her constipation, she is chronically on Linzess to 72 mcg as needed.  She was previously on Linzess 145 mcg daily which she states was too much.  Notes trying to eat healthier lately by including more fruits and vegetables in her diet.   Past Medical History:  Diagnosis Date   Adnexal cyst    Right simple cyst seen on [place]   Anginal pain (HCC)    Anxiety    Arthritis    Asthma    Atrial fibrillation (HCC)    hx of    CHF (congestive heart failure) (HCC)    Chronic back pain    Chronic hip pain    COPD (chronic obstructive pulmonary disease) ([place])    Coronary artery disease    stents placed    Dyspnea    GERD (gastroesophageal reflux disease)    Headache    due to pinched nerve damaage    Heart murmur    hx of slight murmur    History of bronchitis    History of cardiac catheterization    Normal coronaries 2013   History of kidney stones    Hot flashes    Hypertension    Hypertriglyceridemia    IBS (irritable bowel syndrome)    Internal hemorrhoids    Morbid obesity ([place])    s/p lap band surgery   Myocardial infarction ([place]) 12/2018   times 3   Obesity    Pulmonary HTN ([place])    [place] 2008:  RA pressures 13/10 with a mean of 7 mmHg.  RV pressure 22/63 with end diastolic pressure of 15 mmHg.  PA pressure 49/23 with a mean of 35 mmHg.  Pulmonary  capillary wedge 17/50 with a mean of 14 mmHg. The PA saturation was 68%.  RA saturation was 71% and aortic saturation was 90%.  Cardiac output was 6.0 with a cardiac index of 2.80 by [place].  Normal coronaries by cath 2008.   Renal insufficiency    Sciatica of right side    Sleep apnea    CPAP use does not know settings    Trauma    Type 2 diabetes mellitus ([place])    type II    Uterine fibroid     Past Surgical History:  Procedure Laterality Date   accupuncture     APPENDECTOMY     CARDIAC CATHETERIZATION  07/2007, 05/2012   Normal coronary arteries   COLONOSCOPY  10/18/2010   SLF:2-mm sessile sigmoid polyp/diverticula, tortuous colon.  Biopsies of sigmoid colon polyp and random colon biopsies benign   COLONOSCOPY WITH PROPOFOL N/A 08/09/2020   Procedure: COLONOSCOPY WITH PROPOFOL;  Surgeon: Eloise Harman, DO;  Location: [facility];  Service: Endoscopy;  Laterality: N/A;  11:00am   CYSTOSCOPY/URETEROSCOPY/HOLMIUM LASER/STENT PLACEMENT Right 08/01/2017   Procedure: CYSTOSCOPY/ RIGHT URETEROSCOPY/ RIGHT RETROGRADE/ HOLMIUM LASER  AND RIGHT STENT PLACEMENT FIRST STAGE;  Surgeon: Irine Seal, MD;  Location: [facility];  Service: Urology;  Laterality: Right;   CYSTOSCOPY/URETEROSCOPY/HOLMIUM LASER/STENT PLACEMENT Right 08/08/2017   Procedure: RIGHT URETEROSCOPY WITH HOLMIUM LASER RIGHT STENT PLACEMENT;  Surgeon: Irine Seal, MD;  Location: [facility];  Service: Urology;  Laterality: Right;   EXTRACORPOREAL SHOCK WAVE LITHOTRIPSY Right 08/22/2017   Procedure: RIGHT EXTRACORPOREAL SHOCK WAVE LITHOTRIPSY (ESWL);  Surgeon: Alexis Frock, MD;  Location: [facility];  Service: Urology;  Laterality: Right;   HEMORRHOID BANDING  07/2012   Dr. Oneida Alar   LAPAROSCOPIC CHOLECYSTECTOMY  1985   LAPAROSCOPIC GASTRIC BANDING  12/12/2010   LEFT AND RIGHT HEART CATHETERIZATION WITH CORONARY ANGIOGRAM N/A 06/03/2012   Procedure: LEFT AND RIGHT HEART CATHETERIZATION WITH CORONARY ANGIOGRAM;  Surgeon: Jolaine Artist, MD;   Location: [facility] CATH LAB;  Service: Cardiovascular;  Laterality: N/A;   PILONIDAL CYST EXCISION  1974   RIGHT/LEFT HEART CATH AND CORONARY ANGIOGRAPHY N/A 03/28/2020   Procedure: RIGHT/LEFT HEART CATH AND CORONARY ANGIOGRAPHY;  Surgeon: Jolaine Artist, MD;  Location: [facility] CV LAB;  Service: Cardiovascular;  Laterality: N/A;   TUMOR EXCISION  10/2011   Left thigh   TUMOR EXCISION  10/2011   Face    Current Outpatient Medications  Medication Sig Dispense Refill   albuterol (PROVENTIL) (2.5 MG/3ML) 0.083% nebulizer solution Take 2.5 mg by nebulization every 4 (four) hours as needed for wheezing or shortness of breath.      albuterol (VENTOLIN HFA) 108 (90 Base) MCG/ACT inhaler Inhale 2 puffs into the lungs every 6 (six) hours as needed for wheezing or shortness of breath. 18 g 11   Alcohol Swabs (B-D SINGLE USE SWABS REGULAR) PADS      amLODipine (NORVASC) 10 MG tablet Take 10 mg by mouth every evening.      Ascorbic Acid (VITAMIN C) 500 MG CAPS Take 500 mg by mouth daily.      baclofen (LIORESAL) 10 MG tablet TAKE 1 TO 2 TABLETS FOUR TIMES DAILY 450 tablet 1   Blood Glucose Calibration (TRUE METRIX LEVEL 2) Normal SOLN      Cholecalciferol (VITAMIN D3) 1.25 MG (50000 UT) TABS Patient takes 1 tablet by mouth once a week.     clobetasol (TEMOVATE) 0.05 % external solution Apply 1 application topically daily as needed (scalp irritation).      fenofibrate 160 MG tablet Take 160 mg by mouth daily.     fluticasone (FLONASE) 50 MCG/ACT nasal spray Place 1 spray into both nostrils daily.      furosemide (LASIX) 40 MG tablet Take 40 mg by mouth daily as needed.     gabapentin (NEURONTIN) 600 MG tablet Take 1 tablet (600 mg total) by mouth 4 (four) times daily. 120 tablet 2   isosorbide mononitrate (IMDUR) 30 MG 24 hr tablet Take 30 mg by mouth daily.      levocetirizine (XYZAL) 5 MG tablet Take 5 mg by mouth at bedtime.     losartan-hydrochlorothiazide (HYZAAR) 100-25 MG tablet Take 1 tablet  by mouth daily.     magnesium oxide (MAG-OX) 400 MG tablet Take 400 mg by mouth daily.      Multiple Vitamin (MULTIVITAMIN WITH MINERALS) TABS tablet Take 1 tablet by mouth daily.     ondansetron (ZOFRAN) 4 MG tablet Take 4 mg by mouth every 8 (eight) hours as needed for nausea or vomiting.      oxybutynin (DITROPAN-XL) 10 MG 24 hr tablet Take 1 tablet (10 mg  total) by mouth daily. (Patient taking differently: Take 10 mg by mouth daily. As needed) 30 tablet 11   oxyCODONE-acetaminophen (PERCOCET) 10-325 MG tablet Take 1 tablet by mouth 4 (four) times daily as needed for pain. 30 tablet 0   potassium chloride SA (K-DUR) 20 MEQ tablet Take 20 mEq by mouth daily.      pravastatin (PRAVACHOL) 40 MG tablet Take 40 mg by mouth daily.     prednisoLONE 5 MG TABS tablet Take by mouth.     SYMBICORT 160-4.5 MCG/ACT inhaler Inhale 2 puffs into the lungs 2 (two) times daily. 30.6 g 3   TRUE METRIX BLOOD GLUCOSE TEST test strip      TRUEplus Lancets 38H MISC      TRULICITY 1.5 WE/9.9BZ SOPN Inject 1.5 mg into the skin every 7 (seven) days.      vitamin B-12 (CYANOCOBALAMIN) 1000 MCG tablet Take 1,000 mcg by mouth daily.     Vitamin D, Ergocalciferol, (DRISDOL) 1.25 MG (50000 UNIT) CAPS capsule Take 50,000 Units by mouth every 7 (seven) days.      ALPRAZolam (XANAX) 0.5 MG tablet Take 0.5 mg by mouth daily as needed for anxiety.  (Patient not taking: Reported on 05/31/2022)     chlorhexidine (PERIDEX) 0.12 % solution SMARTSIG:Syringe(s) By Mouth (Patient not taking: Reported on 05/31/2022)     EPINEPHrine 0.3 mg/0.3 mL IJ SOAJ injection Inject 0.3 mg into the muscle as needed for anaphylaxis.  (Patient not taking: Reported on 05/31/2022)     linaclotide (LINZESS) 72 MCG capsule Take 1 capsule (72 mcg total) by mouth daily before breakfast. 90 capsule 3   nitroGLYCERIN (NITROSTAT) 0.4 MG SL tablet Place 0.4 mg under the tongue every 5 (five) minutes as needed for chest pain.  (Patient not taking: Reported on 05/31/2022)      No current facility-administered medications for this visit.    Allergies as of 05/31/2022 - Review Complete 05/31/2022  Allergen Reaction Noted   Ancef [cefazolin] Anaphylaxis and Shortness Of Breath 07/23/2017   Bee venom Anaphylaxis 05/04/2017   Meloxicam  03/16/2020   Ceftin [cefuroxime axetil] Swelling 10/06/2013   Chicken meat (diagnostic) Nausea And Vomiting 03/16/2020   Ciprofloxacin Cough 09/27/2011   Macrodantin [nitrofurantoin] Other (See Comments) 07/23/2017   Meat [alpha-gal] Nausea And Vomiting 03/16/2020   Nsaids Other (See Comments) 03/16/2020   Other Other (See Comments) 07/23/2017   Latex Itching and Rash 04/22/2012   Milk-related compounds Other (See Comments) 11/21/2016    Family History  Problem Relation Age of Onset   Diabetes Mother    Heart failure Mother    Breast cancer Sister    CAD Father    Hypertension Father    Heart failure Father    Cancer Paternal Grandfather    Hypertension Paternal Grandmother    Stroke Paternal Grandmother    Other Maternal Grandmother        tonsilitis   Diabetes Maternal Grandfather    Hypertension Maternal Grandfather    Breast cancer Paternal Aunt     Social History   Socioeconomic History   Marital status: Single    Spouse name: Not on file   Number of children: 0   Years of education: Not on file   Highest education level: Not on file  Occupational History   Occupation: Volunteers   Occupation: Midwife    Comment: Retired from [employer] in [place] Use   Smoking status: Former    Types: Cigarettes    Quit date:  08/08/1996    Years since quitting: 25.8   Smokeless tobacco: Never  Vaping Use   Vaping Use: Never used  Substance and Sexual Activity   Alcohol use: No   Drug use: No   Sexual activity: Yes    Birth control/protection: Post-menopausal  Other Topics Concern   Not on file  Social History Narrative   Not on file   Social Determinants of Health   Financial Resource  Strain: Not on file  Food Insecurity: No Food Insecurity   Worried About Running Out of Food in the Last Year: Never true   Ran Out of Food in the Last Year: Never true  Transportation Needs: Unknown   Lack of Transportation (Medical): No   Lack of Transportation (Non-Medical): Not on file  Physical Activity: Not on file  Stress: Not on file  Social Connections: Not on file    Subjective: Review of Systems  Constitutional:  Negative for chills and fever.  HENT:  Negative for congestion and hearing loss.   Eyes:  Negative for blurred vision and double vision.  Respiratory:  Negative for cough and shortness of breath.   Cardiovascular:  Negative for chest pain and palpitations.  Gastrointestinal:  Positive for constipation. Negative for abdominal pain, blood in stool, diarrhea, heartburn, melena and vomiting.  Genitourinary:  Negative for dysuria and urgency.  Musculoskeletal:  Negative for joint pain and myalgias.  Skin:  Negative for itching and rash.  Neurological:  Negative for dizziness and headaches.  Psychiatric/Behavioral:  Negative for depression. The patient is not nervous/anxious.     Objective: BP (!) 140/58   Pulse 98   Temp (!) 97.3 F (36.3 C)   Ht 5' (1.524 m)   Wt 250 lb (113.4 kg)   LMP 06/06/2013   BMI 48.82 kg/m  Physical Exam Constitutional:      Appearance: Normal appearance. She is obese.  HENT:     Head: Normocephalic and atraumatic.  Eyes:     Extraocular Movements: Extraocular movements intact.     Conjunctiva/sclera: Conjunctivae normal.  Cardiovascular:     Rate and Rhythm: Normal rate and regular rhythm.  Pulmonary:     Effort: Pulmonary effort is normal.     Breath sounds: Normal breath sounds.  Abdominal:     General: Bowel sounds are normal.     Palpations: Abdomen is soft.  Musculoskeletal:        General: No swelling. Normal range of motion.     Cervical back: Normal range of motion and neck supple.  Skin:    General: Skin is  warm and dry.     Coloration: Skin is not jaundiced.  Neurological:     General: No focal deficit present.     Mental Status: She is alert and oriented to person, place, and time.  Psychiatric:        Mood and Affect: Mood normal.        Behavior: Behavior normal.     Assessment: *Constipation *Morbid obesity *Abdominal bloating   Plan: Patient certainly warrants repeat colonoscopy though she would like to hold off for now.  Linzess 72 mcg refilled in office today.  Recommend she take this every day instead as needed and see how she does. Also counseled she could add MiraLAX to her regimen for breakthrough symptoms as well.  Counseled on adequate fiber in her diet.  She may add Benefiber or Metamucil over-the-counter as well.  Also counseled on importance of drinking at least 4 to 6  glasses of water daily.  She states she will work on this.  Patient to follow-up in 6 months or sooner if needed  05/31/2022 2:09 PM   Disclaimer: This note was dictated with voice recognition software. Similar sounding words can inadvertently be transcribed and may not be corrected upon review.

## 2022-05-31 NOTE — Patient Instructions (Signed)
I have refilled your Linzess.  I will also give you samples today.  I want you to start taking this every day to see how you do instead of as needed.  Follow-up with GI in 6 months.  It was nice seeing you again today.  Dr. Abbey Chatters

## 2022-06-05 ENCOUNTER — Other Ambulatory Visit: Payer: Self-pay

## 2022-06-05 NOTE — Patient Outreach (Signed)
[facility]) Care Management  06/05/2022  Sara Tran 19-May-1956 [id number]   Telephone Assessment  Successful outreach call placed to patient. Spoke briefly as patient was really not up to talking. Denies any physical or acute new issues but reports "the same old things." She has completed several recent MD appts. She had sleep study and is using CPAP at night. Appetite good. Wgt stable. Patient trying to eliminate breads and pasts. Blood sugars controlled. BP has been stable-ranging in the 140's. Denies any RN CM needs or concerns at this time.    Medications Reviewed Today     Reviewed by Hayden Pedro, RN (Registered Nurse) on 06/05/22 at 37  Med List Status: <None>   Medication Order Taking? Sig Documenting Provider Last Dose Status Informant  albuterol (PROVENTIL) (2.5 MG/3ML) 0.083% nebulizer solution [id number] No Take 2.5 mg by nebulization every 4 (four) hours as needed for wheezing or shortness of breath.  [provider] Taking Active Self           Med Note [name], JESSICA R   Tue Jul 23, 2017  2:17 PM)    albuterol (VENTOLIN HFA) 108 (90 Base) MCG/ACT inhaler [id number] No Inhale 2 puffs into the lungs every 6 (six) hours as needed for wheezing or shortness of breath. Rigoberto Noel, MD Taking Active   Alcohol Swabs (B-D SINGLE USE SWABS REGULAR) PADS [id number] No  [provider] Taking Active   ALPRAZolam (XANAX) 0.5 MG tablet [id number] No Take 0.5 mg by mouth daily as needed for anxiety.   Patient not taking: Reported on 05/31/2022   [provider] Not Taking Active Self  amLODipine (NORVASC) 10 MG tablet [id number] No Take 10 mg by mouth every evening.  [provider] Taking Active Self  Ascorbic Acid (VITAMIN C) 500 MG CAPS [id number] No Take 500 mg by mouth daily.  [provider] Taking Active Self  baclofen (LIORESAL) 10 MG tablet [id number] No TAKE 1 TO 2 TABLETS FOUR TIMES DAILY Meredith Staggers, MD Taking Active   Blood Glucose Calibration (TRUE METRIX LEVEL 2) Normal SOLN [id number] No  [provider] Taking Active   chlorhexidine (PERIDEX) 0.12 % solution [id number] No SMARTSIG:Syringe(s) By Mouth  Patient not taking: Reported on 05/31/2022   [provider] Not Taking Active   Cholecalciferol (VITAMIN D3) 1.25 MG (50000 UT) TABS [id number] No Patient takes 1 tablet by mouth once a week. [provider] Taking Active   clobetasol (TEMOVATE) 0.05 % external solution [id number] No Apply 1 application topically daily as needed (scalp irritation).  [provider] Taking Active Self  EPINEPHrine 0.3 mg/0.3 mL IJ SOAJ injection [id number] No Inject 0.3 mg into the muscle as needed for anaphylaxis.   Patient not taking: Reported on 05/31/2022   [provider] Not Taking Active Self           Med Note [name], [name]   Wed Jul 06, 2020  1:23 PM)    fenofibrate 160 MG tablet [id number] No Take 160 mg by mouth daily. [provider] Taking Active   fluticasone (FLONASE) 50 MCG/ACT nasal spray [id number] No Place 1 spray into both nostrils daily.  [provider] Taking Active Self  furosemide (LASIX) 40 MG tablet [id number] No Take 40 mg by mouth daily as needed. [provider] Taking Active Self  gabapentin (NEURONTIN) 600 MG tablet [id number] No Take 1 tablet (600 mg total) by mouth 4 (four) times daily. Meredith Staggers,  MD Taking Active   isosorbide mononitrate (IMDUR) 30 MG 24 hr tablet [id number] No Take 30 mg by mouth daily.  [provider] Taking Active Self  levocetirizine (XYZAL) 5 MG tablet [id number] No Take 5 mg by mouth at bedtime. [provider] Taking Active   linaclotide Rolan Lipa) 72 MCG capsule [id number]  Take 1 capsule (72 mcg total) by mouth daily before breakfast. Eloise Harman, DO  Active   losartan-hydrochlorothiazide (HYZAAR) 100-25 MG tablet [id number] No Take 1 tablet by mouth  daily. [provider] Taking Active Self  magnesium oxide (MAG-OX) 400 MG tablet [id number] No Take 400 mg by mouth daily.  [provider] Taking Active Self  Multiple Vitamin (MULTIVITAMIN WITH MINERALS) TABS tablet [id number] No Take 1 tablet by mouth daily. [provider] Taking Active Self  nitroGLYCERIN (NITROSTAT) 0.4 MG SL tablet [id number] No Place 0.4 mg under the tongue every 5 (five) minutes as needed for chest pain.   Patient not taking: Reported on 05/31/2022   [provider] Not Taking Active Self  ondansetron (ZOFRAN) 4 MG tablet [id number] No Take 4 mg by mouth every 8 (eight) hours as needed for nausea or vomiting.  [provider] Taking Active Self  oxybutynin (DITROPAN-XL) 10 MG 24 hr tablet [id number] No Take 1 tablet (10 mg total) by mouth daily.  Patient taking differently: Take 10 mg by mouth daily. As needed   Irine Seal, MD Taking Active   oxyCODONE-acetaminophen (PERCOCET) 10-325 MG tablet [id number] No Take 1 tablet by mouth 4 (four) times daily as needed for pain. Meredith Staggers, MD Taking Active            Med Note Ronnald Ramp, Godfrey Pick   Wed Mar 21, 2022  1:16 PM) Not here for count. Last taken last night  BHJ 03/21/22  potassium chloride SA (K-DUR) 20 MEQ tablet [id number] No Take 20 mEq by mouth daily.  [provider] Taking Active Self  pravastatin (PRAVACHOL) 40 MG tablet [id number] No Take 40 mg by mouth daily. [provider] Taking Active   prednisoLONE 5 MG TABS tablet [id number] No Take by mouth. [provider] Taking Active   SYMBICORT 160-4.5 MCG/ACT inhaler [id number] No Inhale 2 puffs into the lungs 2 (two) times daily. Rigoberto Noel, MD Taking Active   TRUE METRIX BLOOD GLUCOSE TEST test strip [id number] No  [provider] Taking Active   TRUEplus Lancets 33G [place] [id number] No  [provider] Taking Active   TRULICITY 1.5 HQ/1.9XJ SOPN [id number] No Inject 1.5 mg into  the skin every 7 (seven) days.  [provider] Taking Active Self           Med Note [facility], [name]   Wed Mar 16, 2020  2:10 PM)    vitamin B-12 (CYANOCOBALAMIN) 1000 MCG tablet [id number] No Take 1,000 mcg by mouth daily. [provider] Taking Active Self  Vitamin D, Ergocalciferol, (DRISDOL) 1.25 MG (50000 UNIT) CAPS capsule [id number] No Take 50,000 Units by mouth every 7 (seven) days.  [provider] Taking Active Self            Care Plan : Diabetes Type 2 (Adult)  Updates made by Hayden Pedro, RN since 06/05/2022 12:00 AM     Problem: Disease Progression (Diabetes, Type 2)      Long-Range Goal: Disease Progression Prevented or Minimized-Patient will maintain A1C level of less than 7 Completed 06/05/2022  Start Date: 03/20/2021  Expected End Date: 12/30/2023  Recent Progress: On track  Priority: High  Note:   Current Barriers:  Chronic Disease Management support and education needs related to COPD and DMII   RNCM Clinical Goal(s):  Patient will verbalize understanding of plan for management of COPD and DMII as evidenced by mgmt of chronic conditions demonstrate Ongoing health management independence as evidenced by A1C level WNL parameters continue to work with RN Care Manager to address care management and care coordination needs related to  COPD and DMII as evidenced by adherence to CM Team Scheduled appointments through collaboration with RN Care manager, provider, and care team.   Interventions: POC sent to PCP upon initial assessment, quarterly and with any changes in patient's conditions Inter-disciplinary care team collaboration (see longitudinal plan of care) Evaluation of current treatment plan related to  self management and patient's adherence to plan as established by provider   COPD Interventions:  (Status:  Goal on track:  Yes.) Long Term Goal Advised patient to track and manage COPD triggers Discussed the  importance of adequate rest and management of fatigue with COPD   Diabetes Interventions:  (Status:  Goal on track:  Yes.) Long Term Goal Assessed patient's understanding of A1c goal: <7% Provided education to patient about basic DM disease process Reviewed medications with patient and discussed importance of medication adherence Counseled on importance of regular laboratory monitoring as prescribed Lab Results  Component Value Date   HGBA1C 6.3 (H) 01/28/2019   Patient Goals/Self-Care Activities: Take all medications as prescribed Attend all scheduled provider appointments Call provider office for new concerns or questions  enter blood sugar readings and medication or insulin into daily log set goal weight limit outdoor activity during cold weather do breathing exercises every day follow rescue plan if symptoms flare-up  Follow Up Plan:  Telephone follow up appointment with care management team member scheduled for:  within the month of June. The patient has been provided with contact information for the care management team and has been advised to call with any health related questions or concerns.    01/04/39--[id number] due to duplicate care plan and goals      Care Plan : RN Care Manager POC  Updates made by Hayden Pedro, RN since 06/05/2022 12:00 AM     Problem: Chronic Disease Mgmt of Chronic Condition-DM,COPD   Priority: High     Long-Range Goal: Development of POC for Mgmt of Chronic Condition-DM   Start Date: 06/05/2022  Expected End Date: 06/06/2023  Priority: High  Note:   Current Barriers:  Chronic Disease Management support and education needs related to DMII   RNCM Clinical Goal(s):  Patient will verbalize understanding of plan for management of DMII as evidenced by mgmt of chronic conditions demonstrate Improved health management independence as evidenced by A1C level within normal parameters continue to work with RN Care Manager to address care  management and care coordination needs related to  DMII as evidenced by adherence to CM Team Scheduled appointments through collaboration with RN Care manager, provider, and care team.   Interventions: POC sent to PCP upon initial assessment, quarterly and with any changes in patient's conditions Inter-disciplinary care team collaboration (see longitudinal plan of care) Evaluation of current treatment plan related to  self management and patient's adherence to plan as established by provider   Diabetes Interventions:  (Status:  New goal.) Long Term Goal Assessed patient's understanding of A1c goal: <7% Provided education to patient about basic DM disease process Reviewed medications with patient and discussed importance  of medication adherence Counseled on importance of regular laboratory monitoring as prescribed Discussed plans with patient for ongoing care management follow up and provided patient with direct contact information for care management team Lab Results  Component Value Date   HGBA1C 6.3 (H) 01/28/2019   Patient Goals/Self-Care Activities: Take all medications as prescribed Attend all scheduled provider appointments Call provider office for new concerns or questions  enter blood sugar readings and medication or insulin into daily log manage portion size  Follow Up Plan:  Telephone follow up appointment with care management team member scheduled for:  quarterly-within the month of Sept The patient has been provided with contact information for the care management team and has been advised to call with any health related questions or concerns.      Long-Range Goal: Patient-Specific Goal   Start Date: 06/05/2022  Expected End Date: 06/06/2023  Priority: High  Note:   Current Barriers:  Chronic Disease Management support and education needs related to COPD   RNCM Clinical Goal(s):  Patient will verbalize understanding of plan for management of COPD as evidenced by mgmt of  chronic conditions verbalize basic understanding of  COPD disease process and self health management plan as evidenced by adherence to tx plan continue to work with RN Care Manager to address care management and care coordination needs related to  COPD as evidenced by adherence to CM Team Scheduled appointments through collaboration with RN Care manager, provider, and care team.   Interventions: POC sent to PCP upon initial assessment, quarterly and with any changes in patient's conditions Inter-disciplinary care team collaboration (see longitudinal plan of care) Evaluation of current treatment plan related to  self management and patient's adherence to plan as established by provider   COPD Interventions:  (Status:  New goal.) Long Term Goal Advised patient to track and manage COPD triggers Advised patient to self assesses COPD action plan zone and make appointment with provider if in the yellow zone for 48 hours without improvement Discussed the importance of adequate rest and management of fatigue with COPD Reviewed CPAP usage importance and compliance  Patient Goals/Self-Care Activities: Take all medications as prescribed Attend all scheduled provider appointments Call provider office for new concerns or questions  follow rescue plan if symptoms flare-up keep follow-up appointments: PCP and specialists  Follow Up Plan:  Telephone follow up appointment with care management team member scheduled for:  quarterly-within the month of Sept The patient has been provided with contact information for the care management team and has been advised to call with any health related questions or concerns.       Plan: RN CM discussed with patient next outreach within the month of Sept. Patient agrees to care plan and follow up. RN CM will send quarterly update to PCP.   Enzo Montgomery, RN,BSN,CCM [facility] Telephonic Care Management Coordinator Direct Phone: [phone] Toll  Free: [phone] Fax: [phone]

## 2022-06-06 ENCOUNTER — Ambulatory Visit (HOSPITAL_COMMUNITY)
Admission: RE | Admit: 2022-06-06 | Discharge: 2022-06-06 | Disposition: A | Discharge status: Home / Self Care | Payer: Medicare HMO | Source: Ambulatory Visit | Attending: Urology | Admitting: Urology

## 2022-06-06 DIAGNOSIS — Z9884 Bariatric surgery status: Secondary | ICD-10-CM | POA: Diagnosis not present

## 2022-06-06 DIAGNOSIS — R6889 Other general symptoms and signs: Secondary | ICD-10-CM | POA: Diagnosis not present

## 2022-06-06 DIAGNOSIS — I878 Other specified disorders of veins: Secondary | ICD-10-CM | POA: Diagnosis not present

## 2022-06-06 DIAGNOSIS — N200 Calculus of kidney: Secondary | ICD-10-CM | POA: Diagnosis not present

## 2022-06-06 DIAGNOSIS — D259 Leiomyoma of uterus, unspecified: Secondary | ICD-10-CM | POA: Diagnosis not present

## 2022-06-06 DIAGNOSIS — Z87442 Personal history of urinary calculi: Secondary | ICD-10-CM | POA: Diagnosis not present

## 2022-06-07 ENCOUNTER — Ambulatory Visit: Payer: Medicare HMO | Admitting: Urology

## 2022-06-07 ENCOUNTER — Encounter: Payer: Self-pay | Admitting: Urology

## 2022-06-07 VITALS — BP 136/82 | HR 76

## 2022-06-07 DIAGNOSIS — N200 Calculus of kidney: Secondary | ICD-10-CM | POA: Diagnosis not present

## 2022-06-07 DIAGNOSIS — R310 Gross hematuria: Secondary | ICD-10-CM

## 2022-06-07 DIAGNOSIS — N3941 Urge incontinence: Secondary | ICD-10-CM

## 2022-06-07 LAB — URINALYSIS, ROUTINE W REFLEX MICROSCOPIC
Bilirubin, UA: NEGATIVE
Glucose, UA: NEGATIVE
Ketones, UA: NEGATIVE
Leukocytes,UA: NEGATIVE
Nitrite, UA: NEGATIVE
Protein,UA: NEGATIVE
RBC, UA: NEGATIVE
Specific Gravity, UA: 1.02 (ref 1.005–1.030)
Urobilinogen, Ur: 0.2 mg/dL (ref 0.2–1.0)
pH, UA: 5.5 (ref 5.0–7.5)

## 2022-06-07 MED ORDER — OXYBUTYNIN CHLORIDE ER 10 MG PO TB24: 10.0000 mg | ORAL_TABLET | Freq: Every day | ORAL | 3 refills | Status: DC

## 2022-06-07 NOTE — Progress Notes (Signed)
Subjective:  1. Renal stones   2. Urge incontinence   3. Gross hematuria      06/07/22: Sara Tran returns today in f/u for her history of stones, OAB wet  and UTI's.  She remains on Oxbutynin and started it back a few months ago for increased nightime UUI. She has had some hematuria yesterday and last month.   She has some right flank pain and bladder pain.  She has been passing small stones.  A KUB on 06/06/22 shows no new renal or ureteral stones but 4 images were taken and some were oblique which through calcified chondral cartilage  over the renal shadow.     GU Hx: She was treated with ureteroscopy for a right renal stone. She got septic after the last ureteroscopy and her stent came out the day after surgery. She had a 77m RLP fragment that I couldn't reach with the ureteroscope so she was treated with ESWL on 809-14-18 She has passed a few more fragments and her last CT prior to her last visit showed a reduced stone burden with small bilateral stones. the largest being 536min the RLP. Her stone was struvite, Ca Ox, Ca Phos and Ammonium acid urate. Her last culture in October grew Klebsiella and she was last treated with bactrim before going on the augmentin. She has had the GI bug prior to this visit.       ROS:  ROS:  A complete review of systems was performed.  All systems are negative except for pertinent findings as noted.   Review of Systems  Constitutional:  Positive for malaise/fatigue.  HENT:  Positive for congestion and sore throat.   Respiratory:  Positive for cough and shortness of breath.   Cardiovascular:  Positive for leg swelling.  Gastrointestinal:  Positive for constipation, nausea and vomiting.  Musculoskeletal:  Positive for back pain and joint pain.  Skin:  Positive for itching (of the scalp and she has bedbugs.  One was crawling on her head wrap.).  Neurological:  Positive for dizziness, weakness and headaches.  Endo/Heme/Allergies:  Negative for polydipsia.     Allergies  Allergen Reactions   Ancef [Cefazolin] Anaphylaxis and Shortness Of Breath        Bee Venom Anaphylaxis    As a child   Meloxicam     Gi upset    Ceftin [Cefuroxime Axetil] Swelling    Swollen tongue   Chicken Meat (Diagnostic) Nausea And Vomiting   Ciprofloxacin Cough    Coughed up blood   Macrodantin [Nitrofurantoin] Other (See Comments)    Vomited blood and had dark stool.   Meat [Alpha-Gal] Nausea And Vomiting   Nsaids Other (See Comments)    Rectal bleeding    Other Other (See Comments)    Snake venom - Scaly Scalp, SOB, Night Terrors   Latex Itching and Rash   Milk-Related Compounds Other (See Comments)    IBS    Outpatient Encounter Medications as of 06/07/2022  Medication Sig Note   albuterol (PROVENTIL) (2.5 MG/3ML) 0.083% nebulizer solution Take 2.5 mg by nebulization every 4 (four) hours as needed for wheezing or shortness of breath.     albuterol (VENTOLIN HFA) 108 (90 Base) MCG/ACT inhaler Inhale 2 puffs into the lungs every 6 (six) hours as needed for wheezing or shortness of breath.    Alcohol Swabs (B-D SINGLE USE SWABS REGULAR) PADS     ALPRAZolam (XANAX) 0.5 MG tablet Take 0.5 mg by mouth daily as needed for anxiety.  amLODipine (NORVASC) 10 MG tablet Take 10 mg by mouth every evening.     Ascorbic Acid (VITAMIN C) 500 MG CAPS Take 500 mg by mouth daily.     baclofen (LIORESAL) 10 MG tablet TAKE 1 TO 2 TABLETS FOUR TIMES DAILY    Blood Glucose Calibration (TRUE METRIX LEVEL 2) Normal SOLN     chlorhexidine (PERIDEX) 0.12 % solution     Cholecalciferol (VITAMIN D3) 1.25 MG (50000 UT) TABS Patient takes 1 tablet by mouth once a week.    clobetasol (TEMOVATE) 0.05 % external solution Apply 1 application topically daily as needed (scalp irritation).     EPINEPHrine 0.3 mg/0.3 mL IJ SOAJ injection Inject 0.3 mg into the muscle as needed for anaphylaxis.    fenofibrate 160 MG tablet Take 160 mg by mouth daily.    fluticasone (FLONASE) 50 MCG/ACT  nasal spray Place 1 spray into both nostrils daily.     furosemide (LASIX) 40 MG tablet Take 40 mg by mouth daily as needed.    gabapentin (NEURONTIN) 600 MG tablet Take 1 tablet (600 mg total) by mouth 4 (four) times daily.    isosorbide mononitrate (IMDUR) 30 MG 24 hr tablet Take 30 mg by mouth daily.     levocetirizine (XYZAL) 5 MG tablet Take 5 mg by mouth at bedtime.    linaclotide (LINZESS) 72 MCG capsule Take 1 capsule (72 mcg total) by mouth daily before breakfast.    losartan-hydrochlorothiazide (HYZAAR) 100-25 MG tablet Take 1 tablet by mouth daily.    magnesium oxide (MAG-OX) 400 MG tablet Take 400 mg by mouth daily.     Multiple Vitamin (MULTIVITAMIN WITH MINERALS) TABS tablet Take 1 tablet by mouth daily.    nitroGLYCERIN (NITROSTAT) 0.4 MG SL tablet Place 0.4 mg under the tongue every 5 (five) minutes as needed for chest pain.    ondansetron (ZOFRAN) 4 MG tablet Take 4 mg by mouth every 8 (eight) hours as needed for nausea or vomiting.     oxyCODONE-acetaminophen (PERCOCET) 10-325 MG tablet Take 1 tablet by mouth 4 (four) times daily as needed for pain. 03/21/2022: Not here for count. Last taken last night  BHJ 03/21/22   potassium chloride SA (K-DUR) 20 MEQ tablet Take 20 mEq by mouth daily.     pravastatin (PRAVACHOL) 40 MG tablet Take 40 mg by mouth daily.    prednisoLONE 5 MG TABS tablet Take by mouth.    SYMBICORT 160-4.5 MCG/ACT inhaler Inhale 2 puffs into the lungs 2 (two) times daily.    TRUE METRIX BLOOD GLUCOSE TEST test strip     TRUEplus Lancets 28Z MISC     TRULICITY 1.5 MO/2.9UT SOPN Inject 1.5 mg into the skin every 7 (seven) days.     vitamin B-12 (CYANOCOBALAMIN) 1000 MCG tablet Take 1,000 mcg by mouth daily.    Vitamin D, Ergocalciferol, (DRISDOL) 1.25 MG (50000 UNIT) CAPS capsule Take 50,000 Units by mouth every 7 (seven) days.     [DISCONTINUED] oxybutynin (DITROPAN-XL) 10 MG 24 hr tablet Take 1 tablet (10 mg total) by mouth daily. (Patient taking differently:  Take 10 mg by mouth daily. As needed)    oxybutynin (DITROPAN-XL) 10 MG 24 hr tablet Take 1 tablet (10 mg total) by mouth daily.    No facility-administered encounter medications on file as of 06/07/2022.    Past Medical History:  Diagnosis Date   Adnexal cyst    Right simple cyst seen on [place]   Anginal pain ([place])    Anxiety  Arthritis    Asthma    Atrial fibrillation (HCC)    hx of    CHF (congestive heart failure) (HCC)    Chronic back pain    Chronic hip pain    COPD (chronic obstructive pulmonary disease) (HCC)    Coronary artery disease    stents placed    Dyspnea    GERD (gastroesophageal reflux disease)    Headache    due to pinched nerve damaage    Heart murmur    hx of slight murmur    History of bronchitis    History of cardiac catheterization    Normal coronaries 2013   History of kidney stones    Hot flashes    Hypertension    Hypertriglyceridemia    IBS (irritable bowel syndrome)    Internal hemorrhoids    Morbid obesity ([place])    s/p lap band surgery   Myocardial infarction ([place]) 12/2018   times 3   Obesity    Pulmonary HTN ([place])    [place] 2008:  RA pressures 13/10 with a mean of 7 mmHg.  RV pressure 08/14 with end diastolic pressure of 15 mmHg.  PA pressure 49/23 with a mean of 35 mmHg.  Pulmonary capillary wedge 17/50 with a mean of 14 mmHg. The PA saturation was 68%.  RA saturation was 71% and aortic saturation was 90%.  Cardiac output was 6.0 with a cardiac index of 2.80 by Fick.  Normal coronaries by cath 2008.   Renal insufficiency    Sciatica of right side    Sleep apnea    CPAP use does not know settings    Trauma    Type 2 diabetes mellitus ([place])    type II    Uterine fibroid     Past Surgical History:  Procedure Laterality Date   accupuncture     APPENDECTOMY     CARDIAC CATHETERIZATION  07/2007, 05/2012   Normal coronary arteries   COLONOSCOPY  10/18/2010   SLF:2-mm sessile sigmoid polyp/diverticula, tortuous colon.  Biopsies of sigmoid  colon polyp and random colon biopsies benign   COLONOSCOPY WITH PROPOFOL N/A 08/09/2020   Procedure: COLONOSCOPY WITH PROPOFOL;  Surgeon: Eloise Harman, DO;  [facility];  Service: Endoscopy;  Laterality: N/A;  11:00am   CYSTOSCOPY/URETEROSCOPY/HOLMIUM LASER/STENT PLACEMENT Right 08/01/2017   Procedure: CYSTOSCOPY/ RIGHT URETEROSCOPY/ RIGHT RETROGRADE/ HOLMIUM LASER  AND RIGHT STENT PLACEMENT FIRST STAGE;  Surgeon: Irine Seal, MD;  Location: [facility];  Service: Urology;  Laterality: Right;   CYSTOSCOPY/URETEROSCOPY/HOLMIUM LASER/STENT PLACEMENT Right 08/08/2017   Procedure: RIGHT URETEROSCOPY WITH HOLMIUM LASER RIGHT STENT PLACEMENT;  Surgeon: Irine Seal, MD;  Location: [facility];  Service: Urology;  Laterality: Right;   EXTRACORPOREAL SHOCK WAVE LITHOTRIPSY Right 08/22/2017   Procedure: RIGHT EXTRACORPOREAL SHOCK WAVE LITHOTRIPSY (ESWL);  Surgeon: Alexis Frock, MD;  Location: [facility];  Service: Urology;  Laterality: Right;   HEMORRHOID BANDING  07/2012   Dr. Oneida Alar   LAPAROSCOPIC CHOLECYSTECTOMY  1985   LAPAROSCOPIC GASTRIC BANDING  12/12/2010   LEFT AND RIGHT HEART CATHETERIZATION WITH CORONARY ANGIOGRAM N/A 06/03/2012   Procedure: LEFT AND RIGHT HEART CATHETERIZATION WITH CORONARY ANGIOGRAM;  Surgeon: Jolaine Artist, MD;  Location: [facility] CATH LAB;  Service: Cardiovascular;  Laterality: N/A;   PILONIDAL CYST EXCISION  1974   RIGHT/LEFT HEART CATH AND CORONARY ANGIOGRAPHY N/A 03/28/2020   Procedure: RIGHT/LEFT HEART CATH AND CORONARY ANGIOGRAPHY;  Surgeon: Jolaine Artist, MD;  Location: [facility] CV LAB;  Service: Cardiovascular;  Laterality: N/A;   TUMOR EXCISION  10/2011   Left thigh   TUMOR EXCISION  10/2011   Face    Social History   Socioeconomic History   Marital status: Single    Spouse name: Not on file   Number of children: 0   Years of education: Not on file   Highest education level: Not on file  Occupational History   Occupation: Volunteers   Occupation:  Midwife    Comment: Retired from [employer] in [place] Use   Smoking status: Former    Types: Cigarettes    Quit date: 08/08/1996    Years since quitting: 25.8   Smokeless tobacco: Never  Vaping Use   Vaping Use: Never used  Substance and Sexual Activity   Alcohol use: No   Drug use: No   Sexual activity: Yes    Birth control/protection: Post-menopausal  Other Topics Concern   Not on file  Social History Narrative   Not on file   Social Determinants of Health   Financial Resource Strain: Not on file  Food Insecurity: No Food Insecurity (03/05/2022)   Hunger Vital Sign    Worried About Running Out of Food in the Last Year: Never true    [place] in the Last Year: Never true  Transportation Needs: Unknown (03/05/2022)   PRAPARE - Hydrologist (Medical): No    Lack of Transportation (Non-Medical): Not on file  Physical Activity: Insufficiently Active (04/18/2020)   Exercise Vital Sign    Days of Exercise per Week: 1 day    Minutes of Exercise per Session: 10 min  Stress: Not on file  Social Connections: Not on file  Intimate Partner Violence: Not on file    Family History  Problem Relation Age of Onset   Diabetes Mother    Heart failure Mother    Breast cancer Sister    CAD Father    Hypertension Father    Heart failure Father    Cancer Paternal Grandfather    Hypertension Paternal Grandmother    Stroke Paternal Grandmother    Other Maternal Grandmother        tonsilitis   Diabetes Maternal Grandfather    Hypertension Maternal Grandfather    Breast cancer Paternal Aunt        Objective: Vitals:   06/07/22 1103  BP: 136/82  Pulse: 76     Physical Exam Vitals reviewed.  Constitutional:      Appearance: Normal appearance. She is obese.  Neurological:     Mental Status: She is alert.     Lab Results:  Results for orders placed or performed in visit on 06/07/22 (from the past 24 hour(s))  Urinalysis,  Routine w reflex microscopic     Status: None   Collection Time: 06/07/22 11:49 AM  Result Value Ref Range   Specific Gravity, UA 1.020 1.005 - 1.030   pH, UA 5.5 5.0 - 7.5   Color, UA Yellow Yellow   Appearance Ur Clear Clear   Leukocytes,UA Negative Negative   Protein,UA Negative Negative/Trace   Glucose, UA Negative Negative   Ketones, UA Negative Negative   RBC, UA Negative Negative   Bilirubin, UA Negative Negative   Urobilinogen, Ur 0.2 0.2 - 1.0 mg/dL   Nitrite, UA Negative Negative   Microscopic Examination Comment    Narrative   Performed at:  [facility] [address], [place], [place]  [id number] Lab Director:  Platinum MT, Phone:  [phone]     BMET No results for input(s): "NA", "K", "CL", "CO2", "GLUCOSE", "BUN", "CREATININE", "CALCIUM" in the last 72 hours. PSA No results found for: "PSA" No results found for: "TESTOSTERONE"    Studies/Results: KUB from 6/8 reviewed with results noted above.      Assessment & Plan: Right renal stone.  The stone is not clearly seen today and there are no new stones visible but with the recent hematuria, I will get a CT stones study and if that is unremarkable, I will get a  KUB in a year.   History of UTI's.  She wasn't able to get a urine today.   I will have her drop one off later.    UUI.  This problem has recurred.  I have refilled oxybutynin ER '10mg'$ .   Bed bugs.   She has an active infestation.   Meds ordered this encounter  Medications   oxybutynin (DITROPAN-XL) 10 MG 24 hr tablet    Sig: Take 1 tablet (10 mg total) by mouth daily.    Dispense:  90 tablet    Refill:  3     Orders Placed This Encounter  Procedures   CT RENAL STONE STUDY    Standing Status:   Future    Standing Expiration Date:   12/07/2022    Order Specific Question:   Preferred imaging location?    Answer:   [facility]    Order Specific Question:   Radiology Contrast Protocol - do NOT remove file path     Answer:   [email or web address]   Abdomen 1 view (KUB)    Standing Status:   Future    Standing Expiration Date:   06/08/2023    Order Specific Question:   Reason for Exam (SYMPTOM  OR DIAGNOSIS REQUIRED)    Answer:   renal stones    Order Specific Question:   Preferred imaging location?    Answer:   [facility]   Urinalysis, Routine w reflex microscopic      Return in about 1 year (around 06/08/2023) for with KUB.   CC: Sharilyn Sites, MD      Irine Seal 06/08/2022

## 2022-06-11 ENCOUNTER — Encounter: Payer: Self-pay | Admitting: Urology

## 2022-06-14 ENCOUNTER — Ambulatory Visit (HOSPITAL_COMMUNITY): Payer: Medicare HMO

## 2022-06-19 DIAGNOSIS — G894 Chronic pain syndrome: Secondary | ICD-10-CM | POA: Diagnosis not present

## 2022-06-19 DIAGNOSIS — I10 Essential (primary) hypertension: Secondary | ICD-10-CM | POA: Diagnosis not present

## 2022-06-19 DIAGNOSIS — Z6841 Body Mass Index (BMI) 40.0 and over, adult: Secondary | ICD-10-CM | POA: Diagnosis not present

## 2022-06-19 DIAGNOSIS — N183 Chronic kidney disease, stage 3 unspecified: Secondary | ICD-10-CM | POA: Diagnosis not present

## 2022-06-19 DIAGNOSIS — E1129 Type 2 diabetes mellitus with other diabetic kidney complication: Secondary | ICD-10-CM | POA: Diagnosis not present

## 2022-06-19 DIAGNOSIS — E559 Vitamin D deficiency, unspecified: Secondary | ICD-10-CM | POA: Diagnosis not present

## 2022-06-19 DIAGNOSIS — E782 Mixed hyperlipidemia: Secondary | ICD-10-CM | POA: Diagnosis not present

## 2022-06-21 ENCOUNTER — Telehealth: Payer: Self-pay

## 2022-06-21 NOTE — Telephone Encounter (Signed)
Patient states that she got some disturbing news about her kidney function from Dr. Hilma Favors. Patient states she is not suppose to eat meat and she has been eating meat since 2022. Patient wants to know if this would affect her kidney function. Also patient states she has had more "home invasions" that has  been making her sick. She states you would know what she is talking about concerning "home invasions"

## 2022-06-26 ENCOUNTER — Encounter: Payer: Medicare HMO | Attending: Physical Medicine & Rehabilitation | Admitting: Physical Medicine & Rehabilitation

## 2022-06-26 ENCOUNTER — Encounter: Payer: Self-pay | Admitting: Physical Medicine & Rehabilitation

## 2022-06-26 VITALS — BP 131/71 | HR 79 | Temp 98.2°F | Ht 60.0 in | Wt 247.0 lb

## 2022-06-26 DIAGNOSIS — G5601 Carpal tunnel syndrome, right upper limb: Secondary | ICD-10-CM | POA: Diagnosis not present

## 2022-06-26 DIAGNOSIS — M25531 Pain in right wrist: Secondary | ICD-10-CM | POA: Insufficient documentation

## 2022-06-26 DIAGNOSIS — R6889 Other general symptoms and signs: Secondary | ICD-10-CM | POA: Diagnosis not present

## 2022-06-28 ENCOUNTER — Ambulatory Visit (HOSPITAL_COMMUNITY)
Admission: RE | Admit: 2022-06-28 | Discharge: 2022-06-28 | Disposition: A | Discharge status: Home / Self Care | Payer: Medicare HMO | Source: Ambulatory Visit | Attending: Urology | Admitting: Urology

## 2022-06-28 ENCOUNTER — Telehealth: Payer: Self-pay | Admitting: Pulmonary Disease

## 2022-06-28 DIAGNOSIS — M419 Scoliosis, unspecified: Secondary | ICD-10-CM | POA: Diagnosis not present

## 2022-06-28 DIAGNOSIS — R310 Gross hematuria: Secondary | ICD-10-CM

## 2022-06-28 DIAGNOSIS — I700 Atherosclerosis of aorta: Secondary | ICD-10-CM | POA: Diagnosis not present

## 2022-06-28 DIAGNOSIS — N200 Calculus of kidney: Secondary | ICD-10-CM | POA: Diagnosis not present

## 2022-06-28 DIAGNOSIS — Z9884 Bariatric surgery status: Secondary | ICD-10-CM | POA: Diagnosis not present

## 2022-06-28 DIAGNOSIS — D259 Leiomyoma of uterus, unspecified: Secondary | ICD-10-CM | POA: Diagnosis not present

## 2022-06-28 NOTE — Telephone Encounter (Signed)
Does pt need xrays or not they are not seeing an order pt is there now.Hillery Hunter

## 2022-06-28 NOTE — Telephone Encounter (Signed)
Regina from [facility] x-ray called regarding order for cxr.  Stated for blood clot, but no phone message recently regarding blood clot and needing x-ray.  Patient hasn't been seen since 03/06/22 and due to come back in September.  Please advise.

## 2022-06-28 NOTE — Telephone Encounter (Signed)
Called and spoke to [employer]. Confirmed for her that I did not see any orders/notes about patient needing CXR or having blood clot. She states patients pcp office did not know what patient was referring to either. She states patient has already left the hospital. Nothing further needed/

## 2022-07-18 ENCOUNTER — Other Ambulatory Visit (HOSPITAL_COMMUNITY): Payer: Self-pay | Admitting: Family Medicine

## 2022-07-18 DIAGNOSIS — E8940 Asymptomatic postprocedural ovarian failure: Secondary | ICD-10-CM

## 2022-07-18 DIAGNOSIS — Z1231 Encounter for screening mammogram for malignant neoplasm of breast: Secondary | ICD-10-CM

## 2022-07-22 ENCOUNTER — Other Ambulatory Visit: Payer: Self-pay | Admitting: Physical Medicine & Rehabilitation

## 2022-07-22 DIAGNOSIS — G894 Chronic pain syndrome: Secondary | ICD-10-CM

## 2022-07-22 DIAGNOSIS — M797 Fibromyalgia: Secondary | ICD-10-CM

## 2022-07-22 DIAGNOSIS — M47816 Spondylosis without myelopathy or radiculopathy, lumbar region: Secondary | ICD-10-CM

## 2022-07-30 DIAGNOSIS — J449 Chronic obstructive pulmonary disease, unspecified: Secondary | ICD-10-CM | POA: Diagnosis not present

## 2022-07-30 DIAGNOSIS — I5032 Chronic diastolic (congestive) heart failure: Secondary | ICD-10-CM | POA: Diagnosis not present

## 2022-08-08 ENCOUNTER — Ambulatory Visit (HOSPITAL_COMMUNITY)
Admission: RE | Admit: 2022-08-08 | Discharge: 2022-08-08 | Disposition: A | Discharge status: Home / Self Care | Payer: Medicare HMO | Source: Ambulatory Visit | Attending: Family Medicine | Admitting: Family Medicine

## 2022-08-08 DIAGNOSIS — E8940 Asymptomatic postprocedural ovarian failure: Secondary | ICD-10-CM | POA: Diagnosis not present

## 2022-08-08 DIAGNOSIS — Z1231 Encounter for screening mammogram for malignant neoplasm of breast: Secondary | ICD-10-CM | POA: Insufficient documentation

## 2022-08-08 DIAGNOSIS — Z780 Asymptomatic menopausal state: Secondary | ICD-10-CM | POA: Diagnosis not present

## 2022-08-08 DIAGNOSIS — M85852 Other specified disorders of bone density and structure, left thigh: Secondary | ICD-10-CM | POA: Diagnosis not present

## 2022-08-16 DIAGNOSIS — Z6841 Body Mass Index (BMI) 40.0 and over, adult: Secondary | ICD-10-CM | POA: Diagnosis not present

## 2022-08-16 DIAGNOSIS — B372 Candidiasis of skin and nail: Secondary | ICD-10-CM | POA: Diagnosis not present

## 2022-08-16 DIAGNOSIS — G894 Chronic pain syndrome: Secondary | ICD-10-CM | POA: Diagnosis not present

## 2022-08-27 DIAGNOSIS — E119 Type 2 diabetes mellitus without complications: Secondary | ICD-10-CM | POA: Diagnosis not present

## 2022-08-29 ENCOUNTER — Other Ambulatory Visit: Payer: Self-pay

## 2022-08-29 NOTE — Patient Outreach (Signed)
[facility]) Care Management  08/29/2022  Sara Tran 12-14-1956 [id number]   Case Closure    Case is being transferred. Assigned RN CM will outreach and follow up with patient.    Enzo Montgomery, RN,BSN,CCM [facility] Telephonic Care Management Coordinator Direct Phone: [phone] Toll Free: [phone] Fax: [phone]

## 2022-08-30 DIAGNOSIS — J449 Chronic obstructive pulmonary disease, unspecified: Secondary | ICD-10-CM | POA: Diagnosis not present

## 2022-08-30 DIAGNOSIS — I5032 Chronic diastolic (congestive) heart failure: Secondary | ICD-10-CM | POA: Diagnosis not present

## 2022-09-05 ENCOUNTER — Ambulatory Visit: Payer: Medicare HMO

## 2022-09-21 ENCOUNTER — Encounter: Payer: Self-pay | Admitting: Pulmonary Disease

## 2022-09-21 ENCOUNTER — Ambulatory Visit: Payer: Medicare HMO | Admitting: Pulmonary Disease

## 2022-09-21 VITALS — BP 134/76 | HR 76 | Temp 98.3°F | Ht 60.0 in | Wt 246.2 lb

## 2022-09-21 DIAGNOSIS — G4733 Obstructive sleep apnea (adult) (pediatric): Secondary | ICD-10-CM | POA: Diagnosis not present

## 2022-09-21 DIAGNOSIS — Z9989 Dependence on other enabling machines and devices: Secondary | ICD-10-CM | POA: Diagnosis not present

## 2022-09-21 DIAGNOSIS — J45998 Other asthma: Secondary | ICD-10-CM

## 2022-09-21 MED ORDER — PREDNISONE 10 MG PO TABS: 10.0000 mg | ORAL_TABLET | ORAL | 0 refills | Status: DC | PRN

## 2022-09-21 NOTE — Assessment & Plan Note (Signed)
Continue Symbicort twice daily. Albuterol for rescue Continue Xyzal and Flonase for allergies  She uses prednisone intermittently for back pain.  Discussed long-term side effects of prednisone including osteoporosis

## 2022-09-21 NOTE — Assessment & Plan Note (Signed)
Titration study and CPAP downloads have shown treatment emergent central apneas. I offered her ASV mode but she would like to hold off. Download today shows residual AHI of about 11/hour with centrals about 6/hour, compliance could be improved, average 3.5 hours per night, no missed night  Weight loss encouraged, compliance with goal of at least 4-6 hrs every night is the expectation. Advised against medications with sedative side effects Cautioned against driving when sleepy - understanding that sleepiness will vary on a day to day basis

## 2022-09-21 NOTE — Patient Instructions (Signed)
  X  Rx for Prednisone 10 mg tabs - use as needed # 30  X Rx for med nasal pillows

## 2022-09-21 NOTE — Progress Notes (Signed)
   Subjective:    Patient ID: Sara Tran, female    DOB: 18-Nov-1956, 66 y.o.   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  Compliant with Symbicort twice daily and albuterol for rescue. She also uses Flonase and Xyzal  Not sleeping well.  She has always struggled with insomnia.  Mask has a large leak and she would like to try nasal mask.  We reviewed titration study  She continues to has chronic back pain, uses 5 mg of prednisone intermittently.  Uses gabapentin twice daily  Significant tests/ events reviewed   PFT's  03/22/12  FEV1  1.66 (88 % ) ratio 0.87  with DLCO  14.27 (75%) corrects to 5.45 (128%)  for alv volume    - RHC  03/28/20  PA  42/17 with mean 29 and wedge 13 and PRV  3.0 wu   -  12/13/2020   Walked RA  approx   300 ft  @ slow/moderate pace  stopped due to  Sob with sats still 94%     04/2022 CPAP titration >>  Central apneas emerged on CPAP 4 cm & persisted on CPAP 8 & BiPAP upto 12/6 NPSG 01/2013 mild, AHI 10/hour, predominantly REM related events, early REM latency 03/2013 CPAP titration >> 9 cm  Review of Systems  neg for any significant sore throat, dysphagia, itching, sneezing, nasal congestion or excess/ purulent secretions, fever, chills, sweats, unintended wt loss, pleuritic or exertional cp, hempoptysis, orthopnea pnd or change in chronic leg swelling. Also denies presyncope, palpitations, heartburn, abdominal pain, nausea, vomiting, diarrhea or change in bowel or urinary habits, dysuria,hematuria, rash, arthralgias, visual complaints, headache, numbness weakness or ataxia.     Objective:   Physical Exam   Gen. Pleasant, obese, in no  distress ENT - no lesions, no post nasal drip Neck: No JVD, no thyromegaly, no carotid bruits Lungs: no use of accessory muscles, no dullness to percussion, decreased without rales or rhonchi  Cardiovascular: Rhythm regular, heart sounds  normal, no murmurs or gallops, no peripheral edema Musculoskeletal: No deformities, no cyanosis or clubbing , no tremors        Assessment & Plan:

## 2022-09-26 ENCOUNTER — Encounter: Payer: Medicare HMO | Attending: Physical Medicine & Rehabilitation | Admitting: Physical Medicine & Rehabilitation

## 2022-09-26 ENCOUNTER — Encounter: Payer: Self-pay | Admitting: Physical Medicine & Rehabilitation

## 2022-09-26 VITALS — BP 125/72 | HR 75 | Ht 60.0 in | Wt 245.0 lb

## 2022-09-26 DIAGNOSIS — M4316 Spondylolisthesis, lumbar region: Secondary | ICD-10-CM | POA: Diagnosis not present

## 2022-09-26 DIAGNOSIS — M5459 Other low back pain: Secondary | ICD-10-CM | POA: Diagnosis not present

## 2022-09-26 DIAGNOSIS — M4696 Unspecified inflammatory spondylopathy, lumbar region: Secondary | ICD-10-CM

## 2022-09-26 DIAGNOSIS — M7061 Trochanteric bursitis, right hip: Secondary | ICD-10-CM | POA: Insufficient documentation

## 2022-09-26 DIAGNOSIS — G894 Chronic pain syndrome: Secondary | ICD-10-CM | POA: Diagnosis not present

## 2022-09-26 MED ORDER — TRIAMCINOLONE ACETONIDE 40 MG/ML IJ SUSP
40.0000 mg | Freq: Once | INTRAMUSCULAR | Status: AC
  Administered 2022-09-26: 1 mg via INTRAMUSCULAR

## 2022-09-26 MED ORDER — LIDOCAINE HCL 1 % IJ SOLN
10.0000 mL | Freq: Once | INTRAMUSCULAR | Status: AC
  Administered 2022-09-26: 4 mL

## 2022-09-26 NOTE — Progress Notes (Signed)
Subjective:    Patient ID: Sara Tran, female    DOB: [birth year], 66 y.o.   MRN: [id number]  HPI  Sara Tran is here in follow up of her chronic pain. She had good results with acupuncture with Dr. Letta Pate. She felt that it strengthened her right arm and hand and helped reduce pain. Still, when she "overdoes" things, her hand can be painful. It also may become cold or tingly when it becomes cold.   From a standpoint of her back, she's having ongoing issues. She comleted therapies in February. She still does some exercises at home. Her low back can become tender when she walks for longer distances. Sitting can cause her right hip to be tender as well. She found the greater troch injection helpful at her last visit with me.   She wants to lose weight and exercise more but still is struggling.    Pain Inventory Average Pain 8 Pain Right Now 8 My pain is constant, sharp, stabbing, tingling, and aching  In the last 24 hours, has pain interfered with the following? General activity 7 Relation with others 7 Enjoyment of life 7 What TIME of day is your pain at its worst? morning  and night Sleep (in general) Poor  Pain is worse with: walking and some activites Pain improves with: rest and medication Relief from Meds: 8  Family History  Problem Relation Age of Onset   Diabetes Mother    Heart failure Mother    Breast cancer Sister    CAD Father    Hypertension Father    Heart failure Father    Cancer Paternal Grandfather    Hypertension Paternal Grandmother    Stroke Paternal Grandmother    Other Maternal Grandmother        tonsilitis   Diabetes Maternal Grandfather    Hypertension Maternal Grandfather    Breast cancer Paternal Aunt    Social History   Socioeconomic History   Marital status: Single    Spouse name: Not on file   Number of children: 0   Years of education: Not on file   Highest education level: Not on file  Occupational History   Occupation: Volunteers    Occupation: Midwife    Comment: Retired from [employer] home in [place] Use   Smoking status: Former    Types: Cigarettes    Quit date: 08/08/1996    Years since quitting: 26.1   Smokeless tobacco: Never  Vaping Use   Vaping Use: Never used  Substance and Sexual Activity   Alcohol use: No   Drug use: No   Sexual activity: Yes    Birth control/protection: Post-menopausal  Other Topics Concern   Not on file  Social History Narrative   Not on file   Social Determinants of Health   Financial Resource Strain: Unknown (04/18/2020)   Overall Financial Resource Strain (CARDIA)    Difficulty of Paying Living Expenses: Patient refused  Food Insecurity: No Food Insecurity (03/05/2022)   Hunger Vital Sign    Worried About Running Out of Food in the Last Year: Never true    Ran Out of Food in the Last Year: Never true  Transportation Needs: Unknown (03/05/2022)   PRAPARE - Hydrologist (Medical): No    Lack of Transportation (Non-Medical): Not on file  Physical Activity: Insufficiently Active (04/18/2020)   Exercise Vital Sign    Days of Exercise per Week: 1 day    Minutes of  Exercise per Session: 10 min  Stress: Unknown (04/18/2020)   [place]    Feeling of Stress : Patient refused  Social Connections: Unknown (04/18/2020)   Social Connection and Isolation Panel [NHANES]    Frequency of Communication with Friends and Family: More than three times a week    Frequency of Social Gatherings with Friends and Family: Patient refused    Attends Religious Services: Patient refused    Active Member of Clubs or Organizations: Yes    Attends Archivist Meetings: Patient refused    Marital Status: Patient refused   Past Surgical History:  Procedure Laterality Date   accupuncture     APPENDECTOMY     CARDIAC CATHETERIZATION  07/2007, 05/2012   Normal coronary arteries   COLONOSCOPY   10/18/2010   SLF:2-mm sessile sigmoid polyp/diverticula, tortuous colon.  Biopsies of sigmoid colon polyp and random colon biopsies benign   COLONOSCOPY WITH PROPOFOL N/A 08/09/2020   Procedure: COLONOSCOPY WITH PROPOFOL;  Surgeon: Eloise Harman, DO;  [facility];  Service: Endoscopy;  Laterality: N/A;  11:00am   CYSTOSCOPY/URETEROSCOPY/HOLMIUM LASER/STENT PLACEMENT Right 08/01/2017   Procedure: CYSTOSCOPY/ RIGHT URETEROSCOPY/ RIGHT RETROGRADE/ HOLMIUM LASER  AND RIGHT STENT PLACEMENT FIRST STAGE;  Surgeon: Irine Seal, MD;  Location: [facility];  Service: Urology;  Laterality: Right;   CYSTOSCOPY/URETEROSCOPY/HOLMIUM LASER/STENT PLACEMENT Right 08/08/2017   Procedure: RIGHT URETEROSCOPY WITH HOLMIUM LASER RIGHT STENT PLACEMENT;  Surgeon: Irine Seal, MD;  Location: [facility];  Service: Urology;  Laterality: Right;   EXTRACORPOREAL SHOCK WAVE LITHOTRIPSY Right 08/22/2017   Procedure: RIGHT EXTRACORPOREAL SHOCK WAVE LITHOTRIPSY (ESWL);  Surgeon: Alexis Frock, MD;  Location: [facility];  Service: Urology;  Laterality: Right;   HEMORRHOID BANDING  07/2012   Dr. Oneida Alar   LAPAROSCOPIC CHOLECYSTECTOMY  1985   LAPAROSCOPIC GASTRIC BANDING  12/12/2010   LEFT AND RIGHT HEART CATHETERIZATION WITH CORONARY ANGIOGRAM N/A 06/03/2012   Procedure: LEFT AND RIGHT HEART CATHETERIZATION WITH CORONARY ANGIOGRAM;  Surgeon: Jolaine Artist, MD;  Location: [facility] C[facility] LAB;  Service: Cardiovascular;  Laterality: N/A;   PILONIDAL CYST EXCISION  1974   RIGHT/LEFT HEART CATH AND CORONARY ANGIOGRAPHY N/A 03/28/2020   Procedure: RIGHT/LEFT HEART CATH AND CORONARY ANGIOGRAPHY;  Surgeon: Jolaine Artist, MD;  Location: [facility];  Service: Cardiovascular;  Laterality: N/A;   TUMOR EXCISION  10/2011   Left thigh   TUMOR EXCISION  10/2011   Face   Past Surgical History:  Procedure Laterality Date   accupuncture     APPENDECTOMY     CARDIAC CATHETERIZATION  07/2007, 05/2012   Normal coronary arteries    COLONOSCOPY  10/18/2010   SLF:2-mm sessile sigmoid polyp/diverticula, tortuous colon.  Biopsies of sigmoid colon polyp and random colon biopsies benign   COLONOSCOPY WITH PROPOFOL N/A 08/09/2020   Procedure: COLONOSCOPY WITH PROPOFOL;  Surgeon: Eloise Harman, DO;  [facility];  Service: Endoscopy;  Laterality: N/A;  11:00am   CYSTOSCOPY/URETEROSCOPY/HOLMIUM LASER/STENT PLACEMENT Right 08/01/2017   Procedure: CYSTOSCOPY/ RIGHT URETEROSCOPY/ RIGHT RETROGRADE/ HOLMIUM LASER  AND RIGHT STENT PLACEMENT FIRST STAGE;  Surgeon: Irine Seal, MD;  [facility];  Service: Urology;  Laterality: Right;   CYSTOSCOPY/URETEROSCOPY/HOLMIUM LASER/STENT PLACEMENT Right 08/08/2017   Procedure: RIGHT URETEROSCOPY WITH HOLMIUM LASER RIGHT STENT PLACEMENT;  Surgeon: Irine Seal, MD;  [facility];  Service: Urology;  Laterality: Right;   EXTRACORPOREAL SHOCK WAVE LITHOTRIPSY Right 08/22/2017   Procedure: RIGHT EXTRACORPOREAL SHOCK WAVE LITHOTRIPSY (  ESWL);  Surgeon: Alexis Frock, MD;  [facility];  Service: Urology;  Laterality: Right;   HEMORRHOID BANDING  07/2012   Dr. Oneida Alar   LAPAROSCOPIC CHOLECYSTECTOMY  1985   LAPAROSCOPIC GASTRIC BANDING  12/12/2010   LEFT AND RIGHT HEART CATHETERIZATION WITH CORONARY ANGIOGRAM N/A 06/03/2012   Procedure: LEFT AND RIGHT HEART CATHETERIZATION WITH CORONARY ANGIOGRAM;  Surgeon: Jolaine Artist, MD;  Location: [facility] CATH LAB;  Service: Cardiovascular;  Laterality: N/A;   PILONIDAL CYST EXCISION  1974   RIGHT/LEFT HEART CATH AND CORONARY ANGIOGRAPHY N/A 03/28/2020   Procedure: RIGHT/LEFT HEART CATH AND CORONARY ANGIOGRAPHY;  Surgeon: Jolaine Artist, MD;  Location: [facility];  Service: Cardiovascular;  Laterality: N/A;   TUMOR EXCISION  10/2011   Left thigh   TUMOR EXCISION  10/2011   Face   Past Medical History:  Diagnosis Date   Adnexal cyst    Right simple cyst seen on [place]   Anginal pain (HCC)    Anxiety    Arthritis    Asthma     Atrial fibrillation (HCC)    hx of    CHF (congestive heart failure) (HCC)    Chronic back pain    Chronic hip pain    COPD (chronic obstructive pulmonary disease) (HCC)    Coronary artery disease    stents placed    Dyspnea    GERD (gastroesophageal reflux disease)    Headache    due to pinched nerve damaage    Heart murmur    hx of slight murmur    History of bronchitis    History of cardiac catheterization    Normal coronaries 2013   History of kidney stones    Hot flashes    Hypertension    Hypertriglyceridemia    IBS (irritable bowel syndrome)    Internal hemorrhoids    Morbid obesity ([place])    s/p lap band surgery   Myocardial infarction ([place]) 12/2018   times 3   Obesity    Pulmonary HTN ([place])    [place] 2008:  RA pressures 13/10 with a mean of 7 mmHg.  RV pressure 25/95 with end diastolic pressure of 15 mmHg.  PA pressure 49/23 with a mean of 35 mmHg.  Pulmonary capillary wedge 17/50 with a mean of 14 mmHg. The PA saturation was 68%.  RA saturation was 71% and aortic saturation was 90%.  Cardiac output was 6.0 with a cardiac index of 2.80 by Fick.  Normal coronaries by cath 2008.   Renal insufficiency    Sciatica of right side    Sleep apnea    CPAP use does not know settings    Trauma    Type 2 diabetes mellitus (HCC)    type II    Uterine fibroid    Ht 5' (1.524 m)   Wt 245 lb (111.1 kg)   LMP 06/06/2013   BMI 47.85 kg/m   Opioid Risk Score:   Fall Risk Score:  `1  Depression screen PHQ 2/9     09/26/2022    2:19 PM 06/26/2022    1:49 PM 05/29/2022    1:53 PM 05/01/2022    2:02 PM 04/03/2022    2:18 PM 04/03/2022    2:07 PM 03/21/2022    1:15 PM  Depression screen PHQ 2/9  Decreased Interest 1 0 0 1 0 0 0  Down, Depressed, Hopeless 1 0 0 1 0 0 0  PHQ - 2 Score 2 0 0 2 0 0  0    Review of Systems  Musculoskeletal:  Positive for back pain and neck pain.       Right shoulder pain, pain in right knee & upper thigh, right foot pain, pain in both hips  All other  systems reviewed and are negative.      Objective:   Physical Exam  General: No acute distress, morbidly obese HEENT: NCAT, EOMI, oral membranes moist Cards: reg rate  Chest: normal effort Abdomen: Soft, NT, ND Skin: dry, intact Extremities: no edema Psych: pleasant and appropriate   Neuro: Pt is cognitively appropriate with normal insight, memory, and awareness. Cranial nerves 2-12 are intact. Sensory exam is normal. Reflexes are 2+ in all 4's. Fine motor coordination is intact. No tremors. Motor function is grossly 5/5.--stable exam Musculoskeletal: pain in R>L greater troch. Low back tender to palpation. Pain is worse with extension than flexion. Cross leg causes pain in right hip. SLR negative. DTR's 1`+ Assessment & Plan:  1.  Chronic low back pain with known facet arthropathy and L4-L5 anterolisthesis.  Has had some intermittent radicular signs as well.  Known dextroscoliosis and maladaptive pelvic positioning also. 2.  Bilateral greater trochanteric bursitis with iliotibial band syndrome right greater than left affected.  3.  Morbid obesity.   4.  Chronic pain/ fibromyalgia syndrome 5.  Cervicalgia 6. Increased anxiety/?depression 7. Right wrist pain, moderate CTS by [facility] 02/02/20 (Dr. Ernestina Patches) 8. ?FMS       Plan: 1. continue Gabapentin  '600mg'$  qid 2. discussed HEP.  Needs to take weight loss and health more seriously. She even admits that she needs to walk more.  3. oxycodone per primary.   4. Baclofen.  10-'20mg'$  qid #150 5. continue with bowel regimen 6. Will not scheduled f/u with Dr. Letta Pate for further acupuncture at this time.            -continue to wear splint  -continue with HEP 7.  After informed consent and preparation of the skin with betadine and isopropyl alcohol, I injected '6mg'$  (1cc) of celestone and 4cc of 1% lidocaine around the right greater troch via lateral approach. Additionally, aspiration was performed prior to injection. The patient tolerated well, and  no complications were encountered. Afterward the area was cleaned and dressed. Post- injection instructions were provided.        15 minutes of face to face patient care time were spent during this visit. All questions were encouraged and answered.  Follow up with me in 6 mos

## 2022-09-26 NOTE — Patient Instructions (Signed)
PLEASE FEEL FREE TO CALL OUR OFFICE WITH ANY PROBLEMS OR QUESTIONS ([phone])      

## 2022-10-04 ENCOUNTER — Other Ambulatory Visit: Payer: Self-pay | Admitting: Pulmonary Disease

## 2022-10-08 DIAGNOSIS — H5203 Hypermetropia, bilateral: Secondary | ICD-10-CM | POA: Diagnosis not present

## 2022-10-09 ENCOUNTER — Encounter: Payer: Self-pay | Admitting: Adult Health

## 2022-10-09 ENCOUNTER — Other Ambulatory Visit (HOSPITAL_COMMUNITY)
Admission: RE | Admit: 2022-10-09 | Discharge: 2022-10-09 | Disposition: A | Discharge status: Home / Self Care | Payer: Medicare HMO | Source: Ambulatory Visit | Attending: Adult Health | Admitting: Adult Health

## 2022-10-09 ENCOUNTER — Ambulatory Visit (INDEPENDENT_AMBULATORY_CARE_PROVIDER_SITE_OTHER): Payer: Medicare HMO | Admitting: Adult Health

## 2022-10-09 VITALS — BP 182/72 | HR 69 | Ht 60.0 in | Wt 246.5 lb

## 2022-10-09 DIAGNOSIS — Z1211 Encounter for screening for malignant neoplasm of colon: Secondary | ICD-10-CM | POA: Diagnosis not present

## 2022-10-09 DIAGNOSIS — Z1151 Encounter for screening for human papillomavirus (HPV): Secondary | ICD-10-CM | POA: Insufficient documentation

## 2022-10-09 DIAGNOSIS — N950 Postmenopausal bleeding: Secondary | ICD-10-CM

## 2022-10-09 DIAGNOSIS — Z01419 Encounter for gynecological examination (general) (routine) without abnormal findings: Secondary | ICD-10-CM | POA: Diagnosis not present

## 2022-10-09 DIAGNOSIS — N3941 Urge incontinence: Secondary | ICD-10-CM

## 2022-10-09 LAB — HEMOCCULT GUIAC POC 1CARD (OFFICE): Fecal Occult Blood, POC: NEGATIVE

## 2022-10-09 NOTE — Progress Notes (Signed)
Patient ID: Sara Tran, female   DOB: 1956/11/29, 66 y.o.   MRN: [id number] History of Present Illness: Sara Tran is a 66 year old black female,single, PM in for well woman gyn exam and pap. She has had some vaginal bleeding. She also says she has no energy, and has some urge urinary incontinence. She had physical with Dr Hilma Favors 01/19/22.  PCP is Dr Hilma Favors.   Current Medications, Allergies, Past Medical History, Past Surgical History, Family History and Social History were reviewed in [employer] record.     Review of Systems: +vaginal bleeding No energy +urge urinary incontinence  She is not having sex    Physical Exam:BP (!) 182/72 (BP Location: Left Arm, Patient Position: Sitting, Cuff Size: Normal)   Pulse 69   Ht 5' (1.524 m)   Wt 246 lb 8 oz (111.8 kg)   LMP 06/06/2013   BMI 48.14 kg/m   General:  Well developed, well nourished, no acute distress Skin:  Warm and dry Neck:  Midline trachea, normal thyroid, good ROM, no lymphadenopathy,no carotid bruits heard Lungs; Clear to auscultation bilaterally Breast:  No dominant palpable mass, retraction, or nipple discharge Cardiovascular: Regular rate and rhythm Abdomen:  Soft,no hepatosplenomegaly,mildly tender over liver area  Pelvic:  External genitalia is normal in appearance, no lesions.  The vagina is pale. Urethra has no lesions or masses. The cervix is smooth, pap with HR HPV genotyping performed.  Uterus is felt to be normal size, shape, and contour, but difficult exam due to abdominal girth.  No adnexal masses or tenderness noted.Bladder is non tender, no masses felt. Rectal: Good sphincter tone, no polyps, or hemorrhoids felt.  Hemoccult negative. Extremities/musculoskeletal:  No swelling or varicosities noted, no clubbing or cyanosis Psych:  No mood changes, alert and cooperative,seems happy AA is 0 Fall risk moderate    10/09/2022    2:10 PM 09/26/2022    2:19 PM 06/26/2022    1:49 PM   Depression screen PHQ 2/9  Decreased Interest 1 1 0  Down, Depressed, Hopeless 0 1 0  PHQ - 2 Score 1 2 0  Altered sleeping 1    Tired, decreased energy 1    Change in appetite 1    Feeling bad or failure about yourself  1    Trouble concentrating 0    Moving slowly or fidgety/restless 0    Suicidal thoughts 0    PHQ-9 Score 5         10/09/2022    2:10 PM 04/18/2020    2:47 PM  GAD 7 : Generalized Anxiety Score  Nervous, Anxious, on Edge 1 1  Control/stop worrying 1 0  Worry too much - different things 1 0  Trouble relaxing 1 0  Restless 0 0  Easily annoyed or irritable 1 1  Afraid - awful might happen 0 0  Total GAD 7 Score 5 2  Anxiety Difficulty  Not difficult at all       Upstream - 10/09/22 1404       Pregnancy Intention Screening   Does the patient want to become pregnant in the next year? N/A    Does the patient's partner want to become pregnant in the next year? N/A    Would the patient like to discuss contraceptive options today? N/A      Contraception Wrap Up   Current Method No Method - Other Reason   postmenopausal   End Method No Method - Other Reason   postmenopausal  Contraception Counseling Provided No            Examination chaperoned by Levy Pupa LPN   Impression and Plan: 1. Encounter for gynecological examination with Papanicolaou smear of cervix Pap sent Physical per PCP Pap in 3 years if normal Negative mammogram 08/08/22  - Cytology - PAP( [facility]) Colonoscopy per GI Labs with PCP  2. Encounter for screening fecal occult blood testing Hemoccult was negative  - POCT occult blood stool  3. PMB (postmenopausal bleeding) +spotting  Will get pelvic US 10/15/22 a 2:30 pm at [facility] to assess endometrial lining of uterus and ovaries - US PELVIC COMPLETE WITH TRANSVAGINAL; Future Follow up 10/22/22 for review of [place]  4. Urge incontinence

## 2022-10-11 ENCOUNTER — Telehealth: Payer: Self-pay | Admitting: Pulmonary Disease

## 2022-10-11 LAB — CYTOLOGY - PAP
Adequacy: ABSENT
Comment: NEGATIVE
Diagnosis: NEGATIVE
High risk HPV: NEGATIVE

## 2022-10-11 MED ORDER — ALBUTEROL SULFATE HFA 108 (90 BASE) MCG/ACT IN AERS: 2.0000 | INHALATION_SPRAY | Freq: Four times a day (QID) | RESPIRATORY_TRACT | 0 refills | Status: AC | PRN

## 2022-10-11 NOTE — Telephone Encounter (Signed)
Refill of albuterol inhaler sent to [facility]. ATC patient to notify. No answer. LVM letting her know.

## 2022-10-12 DIAGNOSIS — G894 Chronic pain syndrome: Secondary | ICD-10-CM | POA: Diagnosis not present

## 2022-10-15 ENCOUNTER — Ambulatory Visit (HOSPITAL_COMMUNITY)
Admission: RE | Admit: 2022-10-15 | Discharge: 2022-10-15 | Disposition: A | Discharge status: Home / Self Care | Payer: Medicare HMO | Source: Ambulatory Visit | Attending: Adult Health | Admitting: Adult Health

## 2022-10-15 DIAGNOSIS — D259 Leiomyoma of uterus, unspecified: Secondary | ICD-10-CM | POA: Diagnosis not present

## 2022-10-15 DIAGNOSIS — N950 Postmenopausal bleeding: Secondary | ICD-10-CM

## 2022-10-15 DIAGNOSIS — N858 Other specified noninflammatory disorders of uterus: Secondary | ICD-10-CM | POA: Diagnosis not present

## 2022-10-16 ENCOUNTER — Ambulatory Visit: Payer: Medicare HMO | Admitting: Pulmonary Disease

## 2022-10-22 ENCOUNTER — Ambulatory Visit: Payer: Medicare HMO | Admitting: Adult Health

## 2022-10-22 ENCOUNTER — Telehealth: Payer: Self-pay | Admitting: Adult Health

## 2022-10-22 NOTE — Telephone Encounter (Signed)
Called pt and her car has broken down, will cancel appt today, informed her of [place] and will want to get endometrial biopsy to assess PMB, EEC not seen well, but  is 3 mm, ovaries not visualized

## 2022-10-23 ENCOUNTER — Ambulatory Visit: Payer: Self-pay | Admitting: *Deleted

## 2022-10-23 NOTE — Patient Outreach (Signed)
  Care Coordination   10/23/2022 Name: Sara Tran MRN: [id number] DOB: 06/12/1956   Care Coordination Outreach Attempts:  An unsuccessful telephone outreach was attempted for a scheduled appointment today.  Follow Up Plan:  Additional outreach attempts will be made to offer the patient care coordination information and services.   Encounter Outcome:  No Answer  Care Coordination Interventions Activated:  No   Care Coordination Interventions:  No, not indicated    Valente David, RN, MSN, [facility] Care Management Care Management Coordinator [phone]

## 2022-11-01 ENCOUNTER — Encounter: Payer: Self-pay | Admitting: *Deleted

## 2022-11-09 ENCOUNTER — Other Ambulatory Visit: Payer: Medicare HMO | Admitting: Obstetrics & Gynecology

## 2022-11-19 ENCOUNTER — Other Ambulatory Visit: Payer: Self-pay

## 2022-11-19 ENCOUNTER — Inpatient Hospital Stay (HOSPITAL_COMMUNITY)
Admission: EM | Admit: 2022-11-19 | Discharge: 2022-11-23 | DRG: 189 | Disposition: A | Discharge status: Home / Self Care | Payer: Medicare HMO | Attending: Family Medicine | Admitting: Family Medicine

## 2022-11-19 ENCOUNTER — Emergency Department (HOSPITAL_COMMUNITY): Payer: Medicare HMO

## 2022-11-19 ENCOUNTER — Encounter (HOSPITAL_COMMUNITY): Payer: Self-pay | Admitting: *Deleted

## 2022-11-19 DIAGNOSIS — T68XXXA Hypothermia, initial encounter: Secondary | ICD-10-CM | POA: Diagnosis not present

## 2022-11-19 DIAGNOSIS — N184 Chronic kidney disease, stage 4 (severe): Secondary | ICD-10-CM | POA: Diagnosis present

## 2022-11-19 DIAGNOSIS — R079 Chest pain, unspecified: Secondary | ICD-10-CM | POA: Diagnosis not present

## 2022-11-19 DIAGNOSIS — E1141 Type 2 diabetes mellitus with diabetic mononeuropathy: Secondary | ICD-10-CM | POA: Diagnosis not present

## 2022-11-19 DIAGNOSIS — Z86010 Personal history of colonic polyps: Secondary | ICD-10-CM

## 2022-11-19 DIAGNOSIS — I110 Hypertensive heart disease with heart failure: Secondary | ICD-10-CM | POA: Diagnosis not present

## 2022-11-19 DIAGNOSIS — Z6841 Body Mass Index (BMI) 40.0 and over, adult: Secondary | ICD-10-CM

## 2022-11-19 DIAGNOSIS — Z833 Family history of diabetes mellitus: Secondary | ICD-10-CM

## 2022-11-19 DIAGNOSIS — K580 Irritable bowel syndrome with diarrhea: Secondary | ICD-10-CM | POA: Diagnosis present

## 2022-11-19 DIAGNOSIS — Z91014 Allergy to mammalian meats: Secondary | ICD-10-CM

## 2022-11-19 DIAGNOSIS — T380X5A Adverse effect of glucocorticoids and synthetic analogues, initial encounter: Secondary | ICD-10-CM | POA: Diagnosis present

## 2022-11-19 DIAGNOSIS — N189 Chronic kidney disease, unspecified: Secondary | ICD-10-CM | POA: Diagnosis not present

## 2022-11-19 DIAGNOSIS — Z886 Allergy status to analgesic agent status: Secondary | ICD-10-CM

## 2022-11-19 DIAGNOSIS — J9601 Acute respiratory failure with hypoxia: Principal | ICD-10-CM

## 2022-11-19 DIAGNOSIS — I4891 Unspecified atrial fibrillation: Secondary | ICD-10-CM | POA: Diagnosis present

## 2022-11-19 DIAGNOSIS — Z79899 Other long term (current) drug therapy: Secondary | ICD-10-CM

## 2022-11-19 DIAGNOSIS — M7918 Myalgia, other site: Secondary | ICD-10-CM | POA: Diagnosis present

## 2022-11-19 DIAGNOSIS — R0789 Other chest pain: Secondary | ICD-10-CM | POA: Diagnosis not present

## 2022-11-19 DIAGNOSIS — E119 Type 2 diabetes mellitus without complications: Secondary | ICD-10-CM | POA: Diagnosis not present

## 2022-11-19 DIAGNOSIS — K219 Gastro-esophageal reflux disease without esophagitis: Secondary | ICD-10-CM | POA: Diagnosis present

## 2022-11-19 DIAGNOSIS — Z87891 Personal history of nicotine dependence: Secondary | ICD-10-CM

## 2022-11-19 DIAGNOSIS — N289 Disorder of kidney and ureter, unspecified: Secondary | ICD-10-CM

## 2022-11-19 DIAGNOSIS — J441 Chronic obstructive pulmonary disease with (acute) exacerbation: Secondary | ICD-10-CM | POA: Diagnosis not present

## 2022-11-19 DIAGNOSIS — I10 Essential (primary) hypertension: Secondary | ICD-10-CM | POA: Diagnosis not present

## 2022-11-19 DIAGNOSIS — I509 Heart failure, unspecified: Principal | ICD-10-CM

## 2022-11-19 DIAGNOSIS — Z9049 Acquired absence of other specified parts of digestive tract: Secondary | ICD-10-CM

## 2022-11-19 DIAGNOSIS — F419 Anxiety disorder, unspecified: Secondary | ICD-10-CM | POA: Diagnosis present

## 2022-11-19 DIAGNOSIS — G478 Other sleep disorders: Secondary | ICD-10-CM

## 2022-11-19 DIAGNOSIS — M5416 Radiculopathy, lumbar region: Secondary | ICD-10-CM

## 2022-11-19 DIAGNOSIS — G4733 Obstructive sleep apnea (adult) (pediatric): Secondary | ICD-10-CM | POA: Diagnosis not present

## 2022-11-19 DIAGNOSIS — E662 Morbid (severe) obesity with alveolar hypoventilation: Secondary | ICD-10-CM | POA: Diagnosis present

## 2022-11-19 DIAGNOSIS — R531 Weakness: Secondary | ICD-10-CM | POA: Diagnosis not present

## 2022-11-19 DIAGNOSIS — N179 Acute kidney failure, unspecified: Secondary | ICD-10-CM | POA: Diagnosis present

## 2022-11-19 DIAGNOSIS — R0689 Other abnormalities of breathing: Secondary | ICD-10-CM | POA: Diagnosis not present

## 2022-11-19 DIAGNOSIS — Z91040 Latex allergy status: Secondary | ICD-10-CM

## 2022-11-19 DIAGNOSIS — G8929 Other chronic pain: Secondary | ICD-10-CM | POA: Diagnosis present

## 2022-11-19 DIAGNOSIS — E781 Pure hyperglyceridemia: Secondary | ICD-10-CM | POA: Diagnosis present

## 2022-11-19 DIAGNOSIS — I5033 Acute on chronic diastolic (congestive) heart failure: Secondary | ICD-10-CM | POA: Diagnosis not present

## 2022-11-19 DIAGNOSIS — R0600 Dyspnea, unspecified: Secondary | ICD-10-CM

## 2022-11-19 DIAGNOSIS — I272 Pulmonary hypertension, unspecified: Secondary | ICD-10-CM | POA: Diagnosis not present

## 2022-11-19 DIAGNOSIS — E875 Hyperkalemia: Secondary | ICD-10-CM | POA: Diagnosis present

## 2022-11-19 DIAGNOSIS — Z7951 Long term (current) use of inhaled steroids: Secondary | ICD-10-CM

## 2022-11-19 DIAGNOSIS — J45901 Unspecified asthma with (acute) exacerbation: Secondary | ICD-10-CM | POA: Diagnosis present

## 2022-11-19 DIAGNOSIS — R6889 Other general symptoms and signs: Secondary | ICD-10-CM

## 2022-11-19 DIAGNOSIS — Z20822 Contact with and (suspected) exposure to covid-19: Secondary | ICD-10-CM | POA: Diagnosis present

## 2022-11-19 DIAGNOSIS — J9811 Atelectasis: Secondary | ICD-10-CM | POA: Diagnosis not present

## 2022-11-19 DIAGNOSIS — E1122 Type 2 diabetes mellitus with diabetic chronic kidney disease: Secondary | ICD-10-CM | POA: Diagnosis present

## 2022-11-19 DIAGNOSIS — M7989 Other specified soft tissue disorders: Secondary | ICD-10-CM | POA: Diagnosis not present

## 2022-11-19 DIAGNOSIS — Z91011 Allergy to milk products: Secondary | ICD-10-CM

## 2022-11-19 DIAGNOSIS — R0602 Shortness of breath: Secondary | ICD-10-CM | POA: Diagnosis not present

## 2022-11-19 DIAGNOSIS — E1165 Type 2 diabetes mellitus with hyperglycemia: Secondary | ICD-10-CM | POA: Diagnosis present

## 2022-11-19 DIAGNOSIS — I959 Hypotension, unspecified: Secondary | ICD-10-CM | POA: Diagnosis not present

## 2022-11-19 DIAGNOSIS — I2510 Atherosclerotic heart disease of native coronary artery without angina pectoris: Secondary | ICD-10-CM | POA: Diagnosis present

## 2022-11-19 DIAGNOSIS — K589 Irritable bowel syndrome without diarrhea: Secondary | ICD-10-CM | POA: Diagnosis present

## 2022-11-19 DIAGNOSIS — I130 Hypertensive heart and chronic kidney disease with heart failure and stage 1 through stage 4 chronic kidney disease, or unspecified chronic kidney disease: Secondary | ICD-10-CM | POA: Diagnosis not present

## 2022-11-19 DIAGNOSIS — Z87442 Personal history of urinary calculi: Secondary | ICD-10-CM

## 2022-11-19 DIAGNOSIS — I252 Old myocardial infarction: Secondary | ICD-10-CM

## 2022-11-19 DIAGNOSIS — N3941 Urge incontinence: Secondary | ICD-10-CM

## 2022-11-19 DIAGNOSIS — Z91030 Bee allergy status: Secondary | ICD-10-CM

## 2022-11-19 DIAGNOSIS — Z881 Allergy status to other antibiotic agents status: Secondary | ICD-10-CM

## 2022-11-19 DIAGNOSIS — Z8719 Personal history of other diseases of the digestive system: Secondary | ICD-10-CM

## 2022-11-19 DIAGNOSIS — M797 Fibromyalgia: Secondary | ICD-10-CM

## 2022-11-19 DIAGNOSIS — Z8249 Family history of ischemic heart disease and other diseases of the circulatory system: Secondary | ICD-10-CM

## 2022-11-19 DIAGNOSIS — I2729 Other secondary pulmonary hypertension: Secondary | ICD-10-CM

## 2022-11-19 LAB — BASIC METABOLIC PANEL
Anion gap: 8 (ref 5–15)
BUN: 35 mg/dL — ABNORMAL HIGH (ref 8–23)
CO2: 26 mmol/L (ref 22–32)
Calcium: 9 mg/dL (ref 8.9–10.3)
Chloride: 105 mmol/L (ref 98–111)
Creatinine, Ser: 2.28 mg/dL — ABNORMAL HIGH (ref 0.44–1.00)
GFR, Estimated: 23 mL/min — ABNORMAL LOW (ref >60)
Glucose, Bld: 214 mg/dL — ABNORMAL HIGH (ref 70–99)
Potassium: 4.4 mmol/L (ref 3.5–5.1)
Sodium: 139 mmol/L (ref 135–145)

## 2022-11-19 LAB — CBC
HCT: 39.2 % (ref 36.0–46.0)
Hemoglobin: 12.5 g/dL (ref 12.0–15.0)
MCH: 32.1 pg (ref 26.0–34.0)
MCHC: 31.9 g/dL (ref 30.0–36.0)
MCV: 100.5 fL — ABNORMAL HIGH (ref 80.0–100.0)
Platelets: 249 10*3/uL (ref 150–400)
RBC: 3.9 MIL/uL (ref 3.87–5.11)
RDW: 12.2 % (ref 11.5–15.5)
WBC: 11.5 10*3/uL — ABNORMAL HIGH (ref 4.0–10.5)
nRBC: 0 % (ref 0.0–0.2)

## 2022-11-19 LAB — RESP PANEL BY RT-PCR (FLU A&B, COVID) ARPGX2
Influenza A by PCR: NEGATIVE
Influenza B by PCR: NEGATIVE
SARS Coronavirus 2 by RT PCR: NEGATIVE

## 2022-11-19 LAB — BLOOD GAS, VENOUS
Acid-Base Excess: 5.6 mmol/L — ABNORMAL HIGH (ref 0.0–2.0)
Bicarbonate: 33.4 mmol/L — ABNORMAL HIGH (ref 20.0–28.0)
Drawn by: 7012
O2 Saturation: 21.6 %
Patient temperature: 37.1
pCO2, Ven: 62 mmHg — ABNORMAL HIGH (ref 44–60)
pH, Ven: 7.34 (ref 7.25–7.43)
pO2, Ven: <31 mmHg — CL (ref 32–45)

## 2022-11-19 LAB — TROPONIN I (HIGH SENSITIVITY)
Troponin I (High Sensitivity): 51 ng/L — ABNORMAL HIGH (ref <18)
Troponin I (High Sensitivity): 77 ng/L — ABNORMAL HIGH (ref <18)
Troponin I (High Sensitivity): 71 ng/L — ABNORMAL HIGH (ref <18)
Troponin I (High Sensitivity): 53 ng/L — ABNORMAL HIGH (ref <18)

## 2022-11-19 LAB — HEPATIC FUNCTION PANEL
ALT: 20 U/L (ref 0–44)
AST: 23 U/L (ref 15–41)
Albumin: 3.4 g/dL — ABNORMAL LOW (ref 3.5–5.0)
Alkaline Phosphatase: 40 U/L (ref 38–126)
Bilirubin, Direct: 0.2 mg/dL (ref 0.0–0.2)
Indirect Bilirubin: 0.3 mg/dL (ref 0.3–0.9)
Total Bilirubin: 0.5 mg/dL (ref 0.3–1.2)
Total Protein: 7.1 g/dL (ref 6.5–8.1)

## 2022-11-19 LAB — GLUCOSE, CAPILLARY: Glucose-Capillary: 240 mg/dL — ABNORMAL HIGH (ref 70–99)

## 2022-11-19 LAB — BRAIN NATRIURETIC PEPTIDE: B Natriuretic Peptide: 81 pg/mL (ref 0.0–100.0)

## 2022-11-19 LAB — MAGNESIUM: Magnesium: 2.2 mg/dL (ref 1.7–2.4)

## 2022-11-19 MED ORDER — LOSARTAN POTASSIUM-HCTZ 100-25 MG PO TABS: 1.0000 | ORAL_TABLET | Freq: Every day | ORAL | Status: DC

## 2022-11-19 MED ORDER — HEPARIN SODIUM (PORCINE) 5000 UNIT/ML IJ SOLN
5000.0000 [IU] | Freq: Three times a day (TID) | INTRAMUSCULAR | Status: DC
  Administered 2022-11-19 – 2022-11-23 (×10): 5000 [IU] via SUBCUTANEOUS
  Filled 2022-11-19 (×10): qty 1

## 2022-11-19 MED ORDER — ACETAMINOPHEN 500 MG PO TABS
1000.0000 mg | ORAL_TABLET | Freq: Four times a day (QID) | ORAL | Status: DC | PRN
  Administered 2022-11-22 – 2022-11-23 (×2): 1000 mg via ORAL
  Filled 2022-11-19 (×2): qty 2

## 2022-11-19 MED ORDER — AMLODIPINE BESYLATE 5 MG PO TABS
10.0000 mg | ORAL_TABLET | Freq: Every evening | ORAL | Status: DC
  Administered 2022-11-19 – 2022-11-22 (×4): 10 mg via ORAL
  Filled 2022-11-19 (×4): qty 2

## 2022-11-19 MED ORDER — FUROSEMIDE 10 MG/ML IJ SOLN
60.0000 mg | Freq: Once | INTRAMUSCULAR | Status: AC
  Administered 2022-11-19: 60 mg via INTRAVENOUS
  Filled 2022-11-19: qty 6

## 2022-11-19 MED ORDER — INSULIN ASPART 100 UNIT/ML IJ SOLN
0.0000 [IU] | Freq: Three times a day (TID) | INTRAMUSCULAR | Status: DC
  Administered 2022-11-20: 2 [IU] via SUBCUTANEOUS

## 2022-11-19 MED ORDER — OXYCODONE HCL 5 MG PO TABS
10.0000 mg | ORAL_TABLET | Freq: Four times a day (QID) | ORAL | Status: DC | PRN
  Administered 2022-11-19 – 2022-11-22 (×5): 10 mg via ORAL
  Filled 2022-11-19 (×5): qty 2

## 2022-11-19 MED ORDER — BACLOFEN 10 MG PO TABS
10.0000 mg | ORAL_TABLET | Freq: Four times a day (QID) | ORAL | Status: DC | PRN
  Filled 2022-11-19: qty 1

## 2022-11-19 MED ORDER — LORATADINE 10 MG PO TABS
10.0000 mg | ORAL_TABLET | Freq: Every evening | ORAL | Status: DC
  Administered 2022-11-19 – 2022-11-22 (×4): 10 mg via ORAL
  Filled 2022-11-19 (×4): qty 1

## 2022-11-19 MED ORDER — LOSARTAN POTASSIUM 50 MG PO TABS: 100.0000 mg | ORAL_TABLET | Freq: Every day | ORAL | Status: DC

## 2022-11-19 MED ORDER — OXYBUTYNIN CHLORIDE ER 5 MG PO TB24
10.0000 mg | ORAL_TABLET | Freq: Every day | ORAL | Status: DC
  Administered 2022-11-20: 10 mg via ORAL
  Filled 2022-11-19 (×2): qty 2

## 2022-11-19 MED ORDER — ISOSORBIDE MONONITRATE ER 30 MG PO TB24
30.0000 mg | ORAL_TABLET | Freq: Every day | ORAL | Status: DC
  Administered 2022-11-20 – 2022-11-23 (×4): 30 mg via ORAL
  Filled 2022-11-19 (×4): qty 1

## 2022-11-19 MED ORDER — LINACLOTIDE 72 MCG PO CAPS
72.0000 ug | ORAL_CAPSULE | Freq: Every day | ORAL | Status: DC
  Filled 2022-11-19 (×7): qty 1

## 2022-11-19 MED ORDER — MAGNESIUM OXIDE 400 MG PO TABS
400.0000 mg | ORAL_TABLET | Freq: Every day | ORAL | Status: DC
  Filled 2022-11-19 (×2): qty 1

## 2022-11-19 MED ORDER — MAGNESIUM OXIDE -MG SUPPLEMENT 400 (240 MG) MG PO TABS
400.0000 mg | ORAL_TABLET | Freq: Every day | ORAL | Status: DC
  Administered 2022-11-20 – 2022-11-23 (×4): 400 mg via ORAL
  Filled 2022-11-19 (×4): qty 1

## 2022-11-19 MED ORDER — PRAVASTATIN SODIUM 40 MG PO TABS
40.0000 mg | ORAL_TABLET | Freq: Every day | ORAL | Status: DC
  Administered 2022-11-20 – 2022-11-23 (×4): 40 mg via ORAL
  Filled 2022-11-19 (×4): qty 1

## 2022-11-19 MED ORDER — DULAGLUTIDE 1.5 MG/0.5ML ~~LOC~~ SOAJ: 1.5000 mg | SUBCUTANEOUS | Status: DC

## 2022-11-19 MED ORDER — ALPRAZOLAM 0.5 MG PO TABS
0.5000 mg | ORAL_TABLET | Freq: Every day | ORAL | Status: DC | PRN
  Administered 2022-11-21: 0.5 mg via ORAL
  Filled 2022-11-19: qty 1

## 2022-11-19 MED ORDER — FUROSEMIDE 10 MG/ML IJ SOLN
40.0000 mg | Freq: Every day | INTRAMUSCULAR | Status: DC
  Administered 2022-11-20: 40 mg via INTRAVENOUS
  Filled 2022-11-19: qty 4

## 2022-11-19 MED ORDER — GABAPENTIN 300 MG PO CAPS
600.0000 mg | ORAL_CAPSULE | Freq: Four times a day (QID) | ORAL | Status: DC
  Administered 2022-11-19 – 2022-11-20 (×2): 600 mg via ORAL
  Filled 2022-11-19 (×2): qty 2

## 2022-11-19 MED ORDER — ASPIRIN 81 MG PO CHEW
81.0000 mg | CHEWABLE_TABLET | Freq: Every day | ORAL | Status: DC
  Administered 2022-11-19 – 2022-11-23 (×5): 81 mg via ORAL
  Filled 2022-11-19 (×5): qty 1

## 2022-11-19 MED ORDER — MOMETASONE FURO-FORMOTEROL FUM 200-5 MCG/ACT IN AERO
2.0000 | INHALATION_SPRAY | Freq: Two times a day (BID) | RESPIRATORY_TRACT | Status: DC
  Administered 2022-11-19 – 2022-11-23 (×8): 2 via RESPIRATORY_TRACT
  Filled 2022-11-19: qty 8.8

## 2022-11-19 MED ORDER — ALBUTEROL SULFATE (2.5 MG/3ML) 0.083% IN NEBU
2.5000 mg | INHALATION_SOLUTION | RESPIRATORY_TRACT | Status: DC | PRN
  Administered 2022-11-20: 2.5 mg via RESPIRATORY_TRACT
  Filled 2022-11-19: qty 3

## 2022-11-19 MED ORDER — POTASSIUM CHLORIDE CRYS ER 20 MEQ PO TBCR
20.0000 meq | EXTENDED_RELEASE_TABLET | Freq: Every day | ORAL | Status: DC
  Administered 2022-11-20: 20 meq via ORAL
  Filled 2022-11-19 (×2): qty 1

## 2022-11-19 MED ORDER — FENOFIBRATE 160 MG PO TABS
160.0000 mg | ORAL_TABLET | Freq: Every day | ORAL | Status: DC
  Administered 2022-11-20 – 2022-11-23 (×4): 160 mg via ORAL
  Filled 2022-11-19 (×4): qty 1

## 2022-11-19 MED ORDER — HYDROCHLOROTHIAZIDE 25 MG PO TABS
25.0000 mg | ORAL_TABLET | Freq: Every day | ORAL | Status: DC
  Administered 2022-11-20: 25 mg via ORAL
  Filled 2022-11-19: qty 1

## 2022-11-19 NOTE — H&P (Addendum)
History and Physical    Patient: Sara Tran:[id number] DOB: 10-04-1956 DOA: 11/19/2022 DOS: the patient was seen and examined on 11/19/2022 PCP: Sharilyn Sites, MD  Patient coming from: Home  Chief Complaint:  Chief Complaint  Patient presents with   Chest Pain   HPI: Sara Tran is a 66 y.o. female with medical history significant of DCHF, COPD, Afib, Anxiety, CKD, Obesity, MI x3, DM, HTN. Pt presenting after 1 wk h/o progressive fatigue, weakness and SOB. No home O2. EMS called ot home and found pt w/ an O2 sat in the 80s. Pt states she reduced her Lasix by half 2 wks prior to admission as she felt "I was doing really well and thought I could come down on my medication." Denies fevers, n/v, ABD pain.  Chest discomfort endorsed at tim eof EMS evaluation was stated to be different from her MI CP. Pt noting fluid retention over the past week. No other changes in medications or other chronic medical conditions.     Review of Systems: As mentioned in the history of present illness. All other systems reviewed and are negative. Past Medical History:  Diagnosis Date   Adnexal cyst    Right simple cyst seen on [place]   Anginal pain (HCC)    Anxiety    Arthritis    Asthma    Atrial fibrillation (HCC)    hx of    CHF (congestive heart failure) (HCC)    Chronic back pain    Chronic hip pain    COPD (chronic obstructive pulmonary disease) ([place])    Coronary artery disease    stents placed    Dyspnea    GERD (gastroesophageal reflux disease)    Headache    due to pinched nerve damaage    Heart murmur    hx of slight murmur    History of bronchitis    History of cardiac catheterization    Normal coronaries 2013   History of kidney stones    Hot flashes    Hypertension    Hypertriglyceridemia    IBS (irritable bowel syndrome)    Internal hemorrhoids    Kidney disease    Morbid obesity ([place])    s/p lap band surgery   Myocardial infarction ([place]) 12/2018   times 3   Obesity     Pulmonary HTN ([place])    Micro 2008:  RA pressures 13/10 with a mean of 7 mmHg.  RV pressure 35/00 with end diastolic pressure of 15 mmHg.  PA pressure 49/23 with a mean of 35 mmHg.  Pulmonary capillary wedge 17/50 with a mean of 14 mmHg. The PA saturation was 68%.  RA saturation was 71% and aortic saturation was 90%.  Cardiac output was 6.0 with a cardiac index of 2.80 by Fick.  Normal coronaries by cath 2008.   Renal insufficiency    Sciatica of right side    Sleep apnea    CPAP use does not know settings    Trauma    Type 2 diabetes mellitus ([place])    type II    Uterine fibroid    Past Surgical History:  Procedure Laterality Date   accupuncture     APPENDECTOMY     CARDIAC CATHETERIZATION  07/2007, 05/2012   Normal coronary arteries   COLONOSCOPY  10/18/2010   SLF:2-mm sessile sigmoid polyp/diverticula, tortuous colon.  Biopsies of sigmoid colon polyp and random colon biopsies benign   COLONOSCOPY WITH PROPOFOL N/A 08/09/2020   Procedure: COLONOSCOPY WITH  PROPOFOL;  Surgeon: Eloise Harman, DO;  [facility];  Service: Endoscopy;  Laterality: N/A;  11:00am   CYSTOSCOPY/URETEROSCOPY/HOLMIUM LASER/STENT PLACEMENT Right 08/01/2017   Procedure: CYSTOSCOPY/ RIGHT URETEROSCOPY/ RIGHT RETROGRADE/ HOLMIUM LASER  AND RIGHT STENT PLACEMENT FIRST STAGE;  Surgeon: Irine Seal, MD;  Location: [facility];  Service: Urology;  Laterality: Right;   CYSTOSCOPY/URETEROSCOPY/HOLMIUM LASER/STENT PLACEMENT Right 08/08/2017   Procedure: RIGHT URETEROSCOPY WITH HOLMIUM LASER RIGHT STENT PLACEMENT;  Surgeon: Irine Seal, MD;  Location: [facility];  Service: Urology;  Laterality: Right;   EXTRACORPOREAL SHOCK WAVE LITHOTRIPSY Right 08/22/2017   Procedure: RIGHT EXTRACORPOREAL SHOCK WAVE LITHOTRIPSY (ESWL);  Surgeon: Alexis Frock, MD;  Location: [facility];  Service: Urology;  Laterality: Right;   HEMORRHOID BANDING  07/2012   Dr. Oneida Alar   LAPAROSCOPIC CHOLECYSTECTOMY  1985   LAPAROSCOPIC GASTRIC BANDING  12/12/2010    LEFT AND RIGHT HEART CATHETERIZATION WITH CORONARY ANGIOGRAM N/A 06/03/2012   Procedure: LEFT AND RIGHT HEART CATHETERIZATION WITH CORONARY ANGIOGRAM;  Surgeon: Jolaine Artist, MD;  Location: [facility] CATH LAB;  Service: Cardiovascular;  Laterality: N/A;   PILONIDAL CYST EXCISION  1974   RIGHT/LEFT HEART CATH AND CORONARY ANGIOGRAPHY N/A 03/28/2020   Procedure: RIGHT/LEFT HEART CATH AND CORONARY ANGIOGRAPHY;  Surgeon: Jolaine Artist, MD;  Location: [facility] LAB;  Service: Cardiovascular;  Laterality: N/A;   TUMOR EXCISION  10/2011   Left thigh   TUMOR EXCISION  10/2011   Face   Social History:  reports that she quit smoking about 26 years ago. Her smoking use included cigarettes. She has never used smokeless tobacco. She reports that she does not drink alcohol and does not use drugs.  Allergies  Allergen Reactions   Ancef [Cefazolin] Anaphylaxis and Shortness Of Breath        Bee Venom Anaphylaxis    As a child   Meloxicam     Gi upset    Ceftin [Cefuroxime Axetil] Swelling    Swollen tongue   Chicken Meat (Diagnostic) Nausea And Vomiting   Ciprofloxacin Cough    Coughed up blood   Macrodantin [Nitrofurantoin] Other (See Comments)    Vomited blood and had dark stool.   Meat [Alpha-Gal] Nausea And Vomiting   Nsaids Other (See Comments)    Rectal bleeding    Other Other (See Comments)    Snake venom - Scaly Scalp, SOB, Night Terrors   Latex Itching and Rash   Milk-Related Compounds Other (See Comments)    IBS    Family History  Problem Relation Age of Onset   Cancer Paternal Grandfather    Hypertension Paternal Grandmother    Stroke Paternal Grandmother    Other Maternal Grandmother        tonsilitis   Diabetes Maternal Grandfather    Hypertension Maternal Grandfather    CAD Father    Hypertension Father    Heart failure Father    Kidney disease Father    Diabetes Mother    Heart failure Mother    Breast cancer Sister    Breast cancer Paternal Aunt      Prior to Admission medications   Medication Sig Start Date End Date Taking? Authorizing Provider  albuterol (PROVENTIL) (2.5 MG/3ML) 0.083% nebulizer solution Take 2.5 mg by nebulization every 4 (four) hours as needed for wheezing or shortness of breath.  09/19/15  Yes [provider]  albuterol (VENTOLIN HFA) 108 (90 Base) MCG/ACT inhaler Inhale 2 puffs into the lungs every 6 (six) hours as needed for  wheezing or shortness of breath. 10/11/22  Yes Rigoberto Noel, MD  ALPRAZolam Duanne Moron) 0.5 MG tablet Take 0.5 mg by mouth daily as needed for anxiety. 06/08/20  Yes [provider]  amLODipine (NORVASC) 10 MG tablet Take 10 mg by mouth every evening.  04/10/13  Yes [provider]  Ascorbic Acid (VITAMIN C) 500 MG CAPS Take 500 mg by mouth daily.    Yes [provider]  baclofen (LIORESAL) 10 MG tablet TAKE 1 TO 2 TABLETS FOUR TIMES DAILY Patient taking differently: Take 10-20 mg by mouth 4 (four) times daily. 07/23/22  Yes Meredith Staggers, MD  Cholecalciferol (VITAMIN D3) 1.25 MG (50000 UT) TABS Patient takes 1 tablet by mouth once a week.   Yes [provider]  clobetasol (TEMOVATE) 0.05 % external solution Apply 1 application topically daily as needed (scalp irritation).  04/15/19  Yes [provider]  fenofibrate 160 MG tablet Take 160 mg by mouth daily. 05/30/21  Yes [provider]  fluticasone (FLONASE) 50 MCG/ACT nasal spray Place 1 spray into both nostrils daily.  06/07/20  Yes [provider]  furosemide (LASIX) 40 MG tablet Take 40 mg by mouth daily. 06/24/19  Yes [provider]  gabapentin (NEURONTIN) 600 MG tablet Take 1 tablet (600 mg total) by mouth 4 (four) times daily. 12/13/21  Yes Meredith Staggers, MD  isosorbide mononitrate (IMDUR) 30 MG 24 hr tablet Take 30 mg by mouth daily.  09/05/11  Yes [provider]  levocetirizine (XYZAL) 5 MG tablet Take 5 mg by mouth at bedtime.   Yes [provider]  linaclotide Rolan Lipa) 72 MCG capsule Take 1 capsule (72 mcg total) by mouth daily before breakfast. 05/31/22 05/31/23 Yes Carver, Elon Alas, DO  losartan-hydrochlorothiazide (HYZAAR) 100-25 MG tablet Take 1 tablet by mouth daily.   Yes [provider]  magnesium oxide (MAG-OX) 400 MG tablet Take 400 mg by mouth daily.    Yes [provider]  Multiple Vitamin (MULTIVITAMIN WITH MINERALS) TABS tablet Take 1 tablet by mouth daily.   Yes [provider]  nitroGLYCERIN (NITROSTAT) 0.4 MG SL tablet Place 0.4 mg under the tongue every 5 (five) minutes as needed for chest pain.   Yes [provider]  ondansetron (ZOFRAN) 4 MG tablet Take 4 mg by mouth every 8 (eight) hours as needed for nausea or vomiting.  11/05/19  Yes [provider]  oxybutynin (DITROPAN-XL) 10 MG 24 hr tablet Take 1 tablet (10 mg total) by mouth daily. Patient taking differently: Take 10 mg by mouth daily as needed. 06/07/22  Yes Irine Seal, MD  Oxycodone HCl 10 MG TABS Take 10 mg by mouth every 6 (six) hours. 10/12/22  Yes [provider]  potassium chloride SA (K-DUR) 20 MEQ tablet Take 20 mEq by mouth daily.  06/12/19  Yes [provider]  pravastatin (PRAVACHOL) 40 MG tablet Take 40 mg by mouth daily. 09/11/21  Yes [provider]  RESTASIS 0.05 % ophthalmic emulsion 1 drop 2 (two) times daily. 08/27/22  Yes [provider]  SYMBICORT 160-4.5 MCG/ACT inhaler Inhale 2 puffs into the lungs 2 (two) times daily. 07/24/21  Yes Rigoberto Noel, MD  TRULICITY 1.5 FT/7.3UK SOPN Inject 1.5 mg into the skin every 7 (seven) days. 01/03/19  Yes [provider]  vitamin B-12 (CYANOCOBALAMIN) 1000 MCG tablet Take 1,000 mcg by mouth daily.   Yes [provider]  Blood Glucose Calibration (TRUE METRIX LEVEL 2) Normal SOLN  05/30/21   [provider]  Blood Glucose Monitoring Suppl (TRUE METRIX AIR GLUCOSE METER) w/Device KIT  08/07/22    [provider]  EPINEPHrine 0.3 mg/0.3 mL IJ SOAJ injection Inject 0.3 mg into the muscle as needed for anaphylaxis. 05/27/19   [provider]  predniSONE (DELTASONE) 10 MG tablet Take 1 tablet (10 mg total) by mouth as needed for up to 30 doses. Patient not taking: Reported on 11/19/2022 09/21/22   Rigoberto Noel, MD  TRUE [facility] BLOOD GLUCOSE TEST test strip  05/30/21   [provider]  TRUEplus Lancets 33G MISC  05/30/21   [provider]    Physical Exam: Vitals:   11/19/22 1400 11/19/22 1406 11/19/22 1426 11/19/22 1500  BP:  (!) 122/57  113/62  Pulse:  (!) 102 96 91  Resp:  _0 Temp:  98.9 F (37.2 C)    TempSrc:  Oral    SpO2:  97% 98% 97%  Weight: 109.8 kg     Height: 5' (1.524 m)      Physical Exam Vitals reviewed.  Constitutional:      General: She is not in acute distress.    Appearance: She is obese.  HENT:     Head: Normocephalic.  Eyes:     Pupils: Pupils are equal, round, and reactive to light.  Neck:     Thyroid: No thyromegaly.  Cardiovascular:     Rate and Rhythm: Normal rate and regular rhythm.     Pulses:          Posterior tibial pulses are 2+ on the left side.     Heart sounds: No murmur heard. Pulmonary:     Effort: Respiratory distress present.     Breath sounds: Decreased breath sounds present. No wheezing, rhonchi or rales.  Chest:     Chest wall: No deformity or tenderness.  Abdominal:     General: Bowel sounds are normal. There is no abdominal bruit.     Palpations: Abdomen is soft.     Tenderness: There is no abdominal tenderness.  Musculoskeletal:        General: Normal range of motion.     Cervical back: Normal range of motion.     Right lower leg: Edema present.     Left lower leg: Edema present.  Lymphadenopathy:     Cervical: No cervical adenopathy.  Skin:    General: Skin is warm and dry.  Neurological:     General: No focal deficit present.     Mental Status: She is alert and oriented  to person, place, and time.  Psychiatric:        Mood and Affect: Mood normal.        Behavior: Behavior normal.     Data Reviewed:  CXR - Cardiomegaly and pulmonary edema on my read.   ECG: Sinus, No ACS. Tachy.  Assessment and Plan: Principal Problem:   Acute respiratory failure with hypoxia (HCC) Active Problems:   Acute on chronic diastolic CHF (congestive heart failure) (HCC)   OSA on CPAP   Morbid obesity (HCC)   Diabetes mellitus (HCC)   IBS (irritable bowel syndrome)-DIARRHEA   Acute on chronic renal insufficiency   Essential hypertension   Musculoskeletal pain      Acute respiratory Failure: Multifactorial - Secondary to obesity hypoventilation, acute on chronic diastolic CHF, and Asthma/COPD Fluid overload on CXR. 2wks prior to admission pt reduced her home Lasix in half.  - O2 PRN -  likely to need O2 at home at time of DC. Wean as able.  - Diuresis as below.  - Wt loss mgt per outpt team  Aucute on Chronic Diastolic CHF - Current Wt 242 (per pt report). Lasix 35m prescribed but pt reduced to 25mPO over past 2 wks. Lasix 6068mV given in ED. L:ast Echo 2018 showing EF 60% and Grade 1 Diastolic Dysfunction.  Elevated Troponin noted - likely from cardiac strain and worsening renal function. EKG reasurring and pain unlike other MI.  - Daily Wts. Difficult to determine Pts "Dry Wt" at this time.  - Lasix 32m42m in am - Repeat Echo - Continue KDur - Repeat trop  CP: Elevated Trop on admission. Suspect cardiac strain w/o ACS. ECG unremarkable in ED and on repeat. Trop stable and no CP at time of admission.  - Tele - Repeat Trop in 4hrs.   Acute on Chronic Kidney disease: Cr 2.28. Baseline likely around 1.6-1.8. Likely due to chornic Diuretic use, DM, and HTN.  - BMP in am - anticipate bumpt due to increased diuresis.   Asthma/COPD: Mild contributor to current episode.  - continue home Symbicort, Albuterol - O2 PRN as above  DM: - SSI - Continue  Trulicity  HTN: Stable on admission but elevated. Likely to drop w/ Diuresis.  - Hold home Hyzaar until renal function is assessed in am Continue home Norvasc  HLD: - continue hom Fenofibrate, pravastatin   Chronic Neuropathic/MSK pain: Likely secondary in part due to obesity and DM - Continue Baclofen, Oxycodone, Magnesium, Neurontin  Cystitis: Chronic - conitnue Oxybutinin  Anxiety: - continue home Xanax  IBS: - continue Linzess   Advance Care Planning:   Code Status: Prior FULL  Consults: NOne  Family Communication: none  Severity of Illness: The appropriate patient status for this patient is INPATIENT. Inpatient status is judged to be reasonable and necessary in order to provide the required intensity of service to ensure the patient's safety. The patient's presenting symptoms, physical exam findings, and initial radiographic and laboratory data in the context of their chronic comorbidities is felt to place them at high risk for further clinical deterioration. Furthermore, it is not anticipated that the patient will be medically stable for discharge from the hospital within 2 midnights of admission.   * I certify that at the point of admission it is my clinical judgment that the patient will require inpatient hospital care spanning beyond 2 midnights from the point of admission due to high intensity of service, high risk for further deterioration and high frequency of surveillance required.*  Author: DaviWaldemar Dickens 11/19/2022 5:29 PM  For on call review [email or web address]

## 2022-11-19 NOTE — Progress Notes (Signed)
Date and time results received: 11/19/22 1505 (use smartphrase ".now" to insert current time)  Test: pO2  Critical Value: <31%  Name of Provider Notified: MD Doren Custard  Orders Received? Or Actions Taken?:  No new orders due to expected results since it came from a venous blood gas

## 2022-11-19 NOTE — ED Triage Notes (Signed)
Pt arrived by EMS c/c of weakness and chest pain on left side for the last week, but was getting worse today, pt took a nitro at home at 2pm, When EMS arrived pain was 10/10, EMS placed pt on 4LNC due to oxygen level being 87%, Pt current pain is 4/10, pt states medical hx of CHF and COPD.

## 2022-11-19 NOTE — Progress Notes (Signed)
   11/19/22 1816  ReDS Vest / Clip  BMI (Calculated) 48.02  Ruler Value 41  ReDS Value Range < 36  ReDS Actual Value 24

## 2022-11-19 NOTE — ED Provider Notes (Signed)
[facility] EMERGENCY DEPARTMENT Provider Note   CSN: [id number] Arrival date & time: 11/19/22  1348     History  Chief Complaint  Patient presents with   Chest Pain    Sara Tran is a 66 y.o. female.   Chest Pain Associated symptoms: fatigue and shortness of breath   Patient presents for worsening generalized weakness and intermittent shortness of breath and chest pain over the past week.  Medical history includes pulmonary hypertension, CHF, chronic pain, OSA, arthritis, obesity, DM, IBS, nephrolithiasis, COPD, atrial fibrillation, anxiety.  Today, she was exerting herself more than normal around the house.  This caused her to have worsening symptoms.  She did take an NTG at home and did have some mild improvement in her chest discomfort.  When EMS arrived on scene, she was found to be 87% on room air.  She does not wear supplemental oxygen at baseline.  She has a history of CHF and COPD.  She does report some mild improvement in her shortness of breath with breathing treatment at home.  She has not been utilizing breathing treatments frequently.  Currently, she endorses continued fatigue, generalized weakness.  She denies any current shortness of breath.  Chest pain is described as mild.  Patient is prescribed 40 mg Lasix tablets.  She states that she typically takes one half of these per day.  She takes an increased dose as needed if she feels that she has increased swelling.     Home Medications Prior to Admission medications   Medication Sig Start Date End Date Taking? Authorizing Provider  albuterol (PROVENTIL) (2.5 MG/3ML) 0.083% nebulizer solution Take 2.5 mg by nebulization every 4 (four) hours as needed for wheezing or shortness of breath.  09/19/15  Yes [provider]  albuterol (VENTOLIN HFA) 108 (90 Base) MCG/ACT inhaler Inhale 2 puffs into the lungs every 6 (six) hours as needed for wheezing or shortness of breath. 10/11/22  Yes Rigoberto Noel, MD  ALPRAZolam  Duanne Moron) 0.5 MG tablet Take 0.5 mg by mouth daily as needed for anxiety. 06/08/20  Yes [provider]  amLODipine (NORVASC) 10 MG tablet Take 10 mg by mouth every evening.  04/10/13  Yes [provider]  Ascorbic Acid (VITAMIN C) 500 MG CAPS Take 500 mg by mouth daily.    Yes [provider]  baclofen (LIORESAL) 10 MG tablet TAKE 1 TO 2 TABLETS FOUR TIMES DAILY Patient taking differently: Take 10-20 mg by mouth 4 (four) times daily. 07/23/22  Yes Meredith Staggers, MD  Cholecalciferol (VITAMIN D3) 1.25 MG (50000 UT) TABS Patient takes 1 tablet by mouth once a week.   Yes [provider]  clobetasol (TEMOVATE) 0.05 % external solution Apply 1 application topically daily as needed (scalp irritation).  04/15/19  Yes [provider]  fenofibrate 160 MG tablet Take 160 mg by mouth daily. 05/30/21  Yes [provider]  fluticasone (FLONASE) 50 MCG/ACT nasal spray Place 1 spray into both nostrils daily.  06/07/20  Yes [provider]  furosemide (LASIX) 40 MG tablet Take 40 mg by mouth daily. 06/24/19  Yes [provider]  gabapentin (NEURONTIN) 600 MG tablet Take 1 tablet (600 mg total) by mouth 4 (four) times daily. 12/13/21  Yes Meredith Staggers, MD  isosorbide mononitrate (IMDUR) 30 MG 24 hr tablet Take 30 mg by mouth daily.  09/05/11  Yes [provider]  levocetirizine (XYZAL) 5 MG tablet Take 5 mg by mouth at bedtime.  Yes [provider]  linaclotide Rolan Lipa) 72 MCG capsule Take 1 capsule (72 mcg total) by mouth daily before breakfast. 05/31/22 05/31/23 Yes Carver, Elon Alas, DO  losartan-hydrochlorothiazide (HYZAAR) 100-25 MG tablet Take 1 tablet by mouth daily.   Yes [provider]  magnesium oxide (MAG-OX) 400 MG tablet Take 400 mg by mouth daily.    Yes [provider]  Multiple Vitamin (MULTIVITAMIN WITH MINERALS) TABS tablet Take 1 tablet by mouth daily.   Yes [provider]   nitroGLYCERIN (NITROSTAT) 0.4 MG SL tablet Place 0.4 mg under the tongue every 5 (five) minutes as needed for chest pain.   Yes [provider]  ondansetron (ZOFRAN) 4 MG tablet Take 4 mg by mouth every 8 (eight) hours as needed for nausea or vomiting.  11/05/19  Yes [provider]  oxybutynin (DITROPAN-XL) 10 MG 24 hr tablet Take 1 tablet (10 mg total) by mouth daily. Patient taking differently: Take 10 mg by mouth daily as needed. 06/07/22  Yes Irine Seal, MD  Oxycodone HCl 10 MG TABS Take 10 mg by mouth every 6 (six) hours. 10/12/22  Yes [provider]  potassium chloride SA (K-DUR) 20 MEQ tablet Take 20 mEq by mouth daily.  06/12/19  Yes [provider]  pravastatin (PRAVACHOL) 40 MG tablet Take 40 mg by mouth daily. 09/11/21  Yes [provider]  RESTASIS 0.05 % ophthalmic emulsion 1 drop 2 (two) times daily. 08/27/22  Yes [provider]  SYMBICORT 160-4.5 MCG/ACT inhaler Inhale 2 puffs into the lungs 2 (two) times daily. 07/24/21  Yes Rigoberto Noel, MD  TRULICITY 1.5 VE/9.3YB SOPN Inject 1.5 mg into the skin every 7 (seven) days. 01/03/19  Yes [provider]  vitamin B-12 (CYANOCOBALAMIN) 1000 MCG tablet Take 1,000 mcg by mouth daily.   Yes [provider]  Blood Glucose Calibration (TRUE METRIX LEVEL 2) Normal SOLN  05/30/21   [provider]  Blood Glucose Monitoring Suppl (TRUE METRIX AIR GLUCOSE METER) w/Device KIT  08/07/22   [provider]  EPINEPHrine 0.3 mg/0.3 mL IJ SOAJ injection Inject 0.3 mg into the muscle as needed for anaphylaxis. 05/27/19   [provider]  predniSONE (DELTASONE) 10 MG tablet Take 1 tablet (10 mg total) by mouth as needed for up to 30 doses. Patient not taking: Reported on 11/19/2022 09/21/22   Rigoberto Noel, MD  TRUE [facility] BLOOD GLUCOSE TEST test strip  05/30/21   [provider]  TRUEplus Lancets 33G MISC  05/30/21   [provider]       Allergies    Ancef [cefazolin], Bee venom, Meloxicam, Ceftin [cefuroxime axetil], Chicken meat (diagnostic), Ciprofloxacin, Macrodantin [nitrofurantoin], Meat [alpha-gal], Nsaids, Other, Latex, and Milk-related compounds    Review of Systems   Review of Systems  Constitutional:  Positive for fatigue.  Respiratory:  Positive for chest tightness and shortness of breath.   Cardiovascular:  Positive for chest pain and leg swelling.  All other systems reviewed and are negative.   Physical Exam Updated Vital Signs BP 113/62   Pulse 91   Temp 98.9 F (37.2 C) (Oral)   Resp 13   Ht 5' (1.524 m)   Wt 109.8 kg   LMP 06/06/2013   SpO2 97%   BMI 47.26 kg/m  Physical Exam Vitals and nursing note reviewed.  Constitutional:      General: She is not in acute distress.    Appearance: She is well-developed. She is not ill-appearing, toxic-appearing  or diaphoretic.  HENT:     Head: Normocephalic and atraumatic.  Eyes:     Conjunctiva/sclera: Conjunctivae normal.  Neck:     Vascular: JVD present.  Cardiovascular:     Rate and Rhythm: Normal rate and regular rhythm.     Heart sounds: No murmur heard. Pulmonary:     Effort: Pulmonary effort is normal. No respiratory distress.     Breath sounds: Decreased breath sounds present. No wheezing.  Chest:     Chest wall: No tenderness.  Abdominal:     Palpations: Abdomen is soft.     Tenderness: There is no abdominal tenderness.  Musculoskeletal:        General: No swelling.     Cervical back: Neck supple.  Skin:    General: Skin is warm and dry.     Capillary Refill: Capillary refill takes less than 2 seconds.  Neurological:     Mental Status: She is alert.  Psychiatric:        Mood and Affect: Mood normal.     ED Results / Procedures / Treatments   Labs (all labs ordered are listed, but only abnormal results are displayed) Labs Reviewed  BASIC METABOLIC PANEL - Abnormal; Notable for the following components:      Result Value    Glucose, Bld 214 (*)    BUN 35 (*)    Creatinine, Ser 2.28 (*)    GFR, Estimated 23 (*)    All other components within normal limits  CBC - Abnormal; Notable for the following components:   WBC 11.5 (*)    MCV 100.5 (*)    All other components within normal limits  HEPATIC FUNCTION PANEL - Abnormal; Notable for the following components:   Albumin 3.4 (*)    All other components within normal limits  BLOOD GAS, VENOUS - Abnormal; Notable for the following components:   pCO2, Ven 62 (*)    pO2, Ven <31 (*)    Bicarbonate 33.4 (*)    Acid-Base Excess 5.6 (*)    All other components within normal limits  TROPONIN I (HIGH SENSITIVITY) - Abnormal; Notable for the following components:   Troponin I (High Sensitivity) 51 (*)    All other components within normal limits  RESP PANEL BY RT-PCR (FLU A&B, COVID) ARPGX2  MAGNESIUM  BRAIN NATRIURETIC PEPTIDE  TROPONIN I (HIGH SENSITIVITY)    EKG EKG Interpretation  Date/Time:  Monday November 19 2022 14:07:54 EST Ventricular Rate:  103 PR Interval:  128 QRS Duration: 79 QT Interval:  337 QTC Calculation: 442 R Axis:   -44 Text Interpretation: Sinus tachycardia Borderline T wave abnormalities Baseline wander in lead(s) I III aVL Confirmed by Godfrey Pick ([id number]) on 11/19/2022 3:08:11 PM  Radiology US Venous Img Lower Unilateral Left  Result Date: 11/19/2022 CLINICAL DATA:  Leg swelling EXAM: LEFT LOWER EXTREMITY VENOUS DOPPLER ULTRASOUND TECHNIQUE: Gray-scale sonography with compression, as well as color and duplex ultrasound, were performed to evaluate the deep venous system(s) from the level of the common femoral vein through the popliteal and proximal calf veins. COMPARISON:  None Available. FINDINGS: VENOUS Normal compressibility of the common femoral, superficial femoral, and popliteal veins, as well as the visualized calf veins. Visualized portions of profunda femoral vein and great saphenous vein unremarkable. No filling defects to  suggest DVT on grayscale or color Doppler imaging. Doppler waveforms show normal direction of venous flow, normal respiratory plasticity and response to augmentation. Limited views of the contralateral common femoral vein are unremarkable.  OTHER None. Limitations: none IMPRESSION: Negative. Electronically Signed   By: Jacqulynn Cadet M.D.   On: 11/19/2022 15:51   DG Chest Port 1 View  Result Date: 11/19/2022 CLINICAL DATA:  Weakness, chest chest pain EXAM: PORTABLE CHEST 1 VIEW COMPARISON:  Portable exam 1431 hours compared to 12/21/2020 FINDINGS: Enlargement of cardiac silhouette. Mediastinal contours and pulmonary vascularity normal. Rotated to the LEFT. Mild RIGHT basilar atelectasis. Remaining lungs clear without pulmonary infiltrate, pleural effusion, or pneumothorax. IMPRESSION: Enlargement of cardiac silhouette with mild RIGHT basilar atelectasis. Electronically Signed   By: Lavonia Dana M.D.   On: 11/19/2022 14:46    Procedures Procedures    Medications Ordered in ED Medications  furosemide (LASIX) injection 60 mg (60 mg Intravenous Given 11/19/22 1605)    ED Course/ Medical Decision Making/ A&P                           Medical Decision Making Amount and/or Complexity of Data Reviewed Labs: ordered. Radiology: ordered.  Risk Prescription drug management. Decision regarding hospitalization.   This patient presents to the ED for concern of shortness of breath, fatigue, generalized weakness, chest pain, this involves an extensive number of treatment options, and is a complaint that carries with it a high risk of complications and morbidity.  The differential diagnosis includes CHF exacerbation, COPD exacerbation, pneumonia, ACS, pericarditis, arrhythmia, valvular dysfunction   Co morbidities that complicate the patient evaluation  pulmonary hypertension, CHF, chronic pain, OSA, arthritis, obesity, DM, IBS, nephrolithiasis, COPD, atrial fibrillation, anxiety   Additional  history obtained:  Additional history obtained from N/A External records from outside source obtained and reviewed including EMR   Lab Tests:  I Ordered, and personally interpreted labs.  The pertinent results include: Increased creatinine from baseline, normal electrolytes, mild leukocytosis and mild elevation in troponin.  BNP is normal.   Imaging Studies ordered:  I ordered imaging studies including chest x-ray, LLE ultrasound I independently visualized and interpreted imaging which showed chest x-ray shows cardiomegaly and right basilar atelectasis; DVT study was negative. I agree with the radiologist interpretation   Cardiac Monitoring: / EKG:  The patient was maintained on a cardiac monitor.  I personally viewed and interpreted the cardiac monitored which showed an underlying rhythm of: Sinus rhythm  Problem List / ED Course / Critical interventions / Medication management  Patient is a 66 year old female presenting for fatigue, exertional shortness of breath, generalized weakness, and intermittent chest pain over the past week.  On arrival, patient found to be hypoxic on room air with SPO2 in the mid to high 80s.  She is not on supplemental oxygen at baseline.  She was placed on 2 L of oxygen by nasal cannula with normalization of SPO2.  On exam, patient has no significant increased work of breathing.  On lung auscultation, she does have diminished breath sounds.  No wheezing is appreciated.  She does have slightly asymmetric lower extremity swelling.  DVT study of left leg was ordered.   EKG does not show ST segment changes.Chest x-ray and laboratory work-up were initiated.  On chest x-ray, patient does have cardiomegaly with what appears to be right lower lobe atelectasis.  Lab work is notable for increased creatinine from baseline, mild leukocytosis, mild elevation in troponin.  Given her increased creatinine, will avoid contrasted studies at this time.  Further studies to assess  for PE is deferred to admitting team.  Patient is prescribed 40 mg of Lasix daily.  I discussed this with her and patient states that she typically takes 1/2 pill daily.  Patient was treated for CHF exacerbation with IV Lasix.  She was admitted to hospitalist for further management.    Social Determinants of Health:  Has access to outpatient care  CRITICAL CARE Performed by: Godfrey Pick   Total critical care time: 32 minutes  Critical care time was exclusive of separately billable procedures and treating other patients.  Critical care was necessary to treat or prevent imminent or life-threatening deterioration.  Critical care was time spent personally by me on the following activities: development of treatment plan with patient and/or surrogate as well as nursing, discussions with consultants, evaluation of patient's response to treatment, examination of patient, obtaining history from patient or surrogate, ordering and performing treatments and interventions, ordering and review of laboratory studies, ordering and review of radiographic studies, pulse oximetry and re-evaluation of patient's condition.         Final Clinical Impression(s) / ED Diagnoses Final diagnoses:  Acute on chronic congestive heart failure, unspecified heart failure type (HCC)  Dyspnea, unspecified type  Exercise intolerance  Acute respiratory failure with hypoxia [facility])    Rx / DC Orders ED Discharge Orders     None         Godfrey Pick, MD 11/19/22 1706

## 2022-11-19 NOTE — Progress Notes (Signed)
Patient had 4 beats of unsustained vtach. Patient asymptomatic. EKG obtained and showed NSR. Dr. Josephine Cables notified.

## 2022-11-20 ENCOUNTER — Inpatient Hospital Stay (HOSPITAL_COMMUNITY): Payer: Medicare HMO

## 2022-11-20 DIAGNOSIS — I5033 Acute on chronic diastolic (congestive) heart failure: Secondary | ICD-10-CM

## 2022-11-20 DIAGNOSIS — J9601 Acute respiratory failure with hypoxia: Secondary | ICD-10-CM | POA: Diagnosis not present

## 2022-11-20 LAB — ECHOCARDIOGRAM COMPLETE
AR max vel: 1.98 cm2
AV Area VTI: 2.14 cm2
AV Area mean vel: 2.06 cm2
AV Mean grad: 7 mmHg
AV Peak grad: 14.3 mmHg
Ao pk vel: 1.89 m/s
Area-P 1/2: 3.53 cm2
Height: 60 in
MV VTI: 3.15 cm2
S' Lateral: 2.5 cm
Weight: 3964.75 oz

## 2022-11-20 LAB — BASIC METABOLIC PANEL
Anion gap: 6 (ref 5–15)
BUN: 39 mg/dL — ABNORMAL HIGH (ref 8–23)
CO2: 27 mmol/L (ref 22–32)
Calcium: 9 mg/dL (ref 8.9–10.3)
Chloride: 108 mmol/L (ref 98–111)
Creatinine, Ser: 2.81 mg/dL — ABNORMAL HIGH (ref 0.44–1.00)
GFR, Estimated: 18 mL/min — ABNORMAL LOW (ref >60)
Glucose, Bld: 116 mg/dL — ABNORMAL HIGH (ref 70–99)
Potassium: 4.3 mmol/L (ref 3.5–5.1)
Sodium: 141 mmol/L (ref 135–145)

## 2022-11-20 LAB — GLUCOSE, CAPILLARY
Glucose-Capillary: 100 mg/dL — ABNORMAL HIGH (ref 70–99)
Glucose-Capillary: 152 mg/dL — ABNORMAL HIGH (ref 70–99)
Glucose-Capillary: 168 mg/dL — ABNORMAL HIGH (ref 70–99)
Glucose-Capillary: 239 mg/dL — ABNORMAL HIGH (ref 70–99)

## 2022-11-20 LAB — HIV ANTIBODY (ROUTINE TESTING W REFLEX): HIV Screen 4th Generation wRfx: NONREACTIVE

## 2022-11-20 MED ORDER — GABAPENTIN 100 MG PO CAPS
200.0000 mg | ORAL_CAPSULE | Freq: Four times a day (QID) | ORAL | Status: DC
  Administered 2022-11-20 – 2022-11-23 (×12): 200 mg via ORAL
  Filled 2022-11-20 (×12): qty 2

## 2022-11-20 MED ORDER — FUROSEMIDE 10 MG/ML IJ SOLN: 40.0000 mg | Freq: Two times a day (BID) | INTRAMUSCULAR | Status: DC

## 2022-11-20 MED ORDER — LIVING BETTER WITH HEART FAILURE BOOK: Freq: Once | Status: AC

## 2022-11-20 MED ORDER — IPRATROPIUM-ALBUTEROL 0.5-2.5 (3) MG/3ML IN SOLN
3.0000 mL | Freq: Three times a day (TID) | RESPIRATORY_TRACT | Status: DC
  Administered 2022-11-20 – 2022-11-22 (×5): 3 mL via RESPIRATORY_TRACT
  Filled 2022-11-20 (×5): qty 3

## 2022-11-20 MED ORDER — METHYLPREDNISOLONE SODIUM SUCC 40 MG IJ SOLR
40.0000 mg | Freq: Two times a day (BID) | INTRAMUSCULAR | Status: DC
  Administered 2022-11-21 – 2022-11-22 (×3): 40 mg via INTRAVENOUS
  Filled 2022-11-20 (×3): qty 1

## 2022-11-20 MED ORDER — INSULIN ASPART 100 UNIT/ML IJ SOLN
0.0000 [IU] | Freq: Three times a day (TID) | INTRAMUSCULAR | Status: DC
  Administered 2022-11-20: 4 [IU] via SUBCUTANEOUS
  Administered 2022-11-21: 11 [IU] via SUBCUTANEOUS
  Administered 2022-11-21: 7 [IU] via SUBCUTANEOUS
  Administered 2022-11-21: 3 [IU] via SUBCUTANEOUS
  Administered 2022-11-22: 7 [IU] via SUBCUTANEOUS
  Administered 2022-11-22: 11 [IU] via SUBCUTANEOUS
  Administered 2022-11-22 – 2022-11-23 (×2): 4 [IU] via SUBCUTANEOUS

## 2022-11-20 MED ORDER — METHYLPREDNISOLONE SODIUM SUCC 40 MG IJ SOLR
40.0000 mg | Freq: Two times a day (BID) | INTRAMUSCULAR | Status: DC
  Administered 2022-11-20: 40 mg via INTRAVENOUS
  Filled 2022-11-20: qty 1

## 2022-11-20 MED ORDER — INSULIN GLARGINE-YFGN 100 UNIT/ML ~~LOC~~ SOLN
10.0000 [IU] | Freq: Every day | SUBCUTANEOUS | Status: DC
  Administered 2022-11-20 – 2022-11-21 (×2): 10 [IU] via SUBCUTANEOUS
  Filled 2022-11-20 (×3): qty 0.1

## 2022-11-20 MED ORDER — FUROSEMIDE 10 MG/ML IJ SOLN
20.0000 mg | Freq: Every day | INTRAMUSCULAR | Status: DC
  Administered 2022-11-21: 20 mg via INTRAVENOUS
  Filled 2022-11-20: qty 2

## 2022-11-20 MED ORDER — IPRATROPIUM-ALBUTEROL 0.5-2.5 (3) MG/3ML IN SOLN
RESPIRATORY_TRACT | Status: AC
  Administered 2022-11-20: 3 mL
  Filled 2022-11-20: qty 3

## 2022-11-20 MED ORDER — METHYLPREDNISOLONE SODIUM SUCC 125 MG IJ SOLR
125.0000 mg | Freq: Once | INTRAMUSCULAR | Status: AC
  Administered 2022-11-20: 125 mg via INTRAVENOUS
  Filled 2022-11-20: qty 2

## 2022-11-20 MED ORDER — HYDRALAZINE HCL 20 MG/ML IJ SOLN: 10.0000 mg | Freq: Four times a day (QID) | INTRAMUSCULAR | Status: DC | PRN

## 2022-11-20 MED ORDER — INSULIN ASPART 100 UNIT/ML IJ SOLN
0.0000 [IU] | Freq: Every day | INTRAMUSCULAR | Status: DC
  Administered 2022-11-20 – 2022-11-21 (×2): 2 [IU] via SUBCUTANEOUS

## 2022-11-20 NOTE — Progress Notes (Signed)
PROGRESS NOTE     Sara Tran, is a 66 y.o. female, DOB - September 06, 1956, IRW:[id number]  Admit date - 11/19/2022   Admitting Physician Waldemar Dickens, MD  Outpatient Primary MD for the patient is Sharilyn Sites, MD  LOS - 1  Chief Complaint  Patient presents with   Chest Pain        Brief Narrative:  66 y.o. female with medical history significant of DCHF, COPD, Afib, Anxiety, CKD, Obesity, MI x3, DM, HTN admitted on 11/19/2022 with acute hypoxic respiratory failure secondary to combination of asthma and presumed CHF CHF exacerbation    -Assessment and Plan:  1)HFpEF--patient with acute on chronic diastolic dysfunction CHF, echo with EF of 60 to 65%, patient has severe pulmonary hypertension with RA gradient of 70 mmHg -Creatinine bumping up with diuresis -Lasix adjusted downward -Daily weights and I's and O's requested -REDs Vest -Patient had right and left heart cath in March 2021 without obstructive CAD - venous Dopplers of the left lower extremity negative for DVT  2)AKI----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 -  creatinine on admission= 2.28 ,  -Baseline creatinine-no recent creatinine available  creatinine is now=2.81  , - renally adjust medications, avoid nephrotoxic agents / dehydration  / hypotension  3) asthma/COPD--- patient endorses concerns about asthma flareup with cough, wheezing--- -IV Solu-Medrol -Bronchodilators as ordered  4) severe pulmonary hypertension--right heart cath from March 2021 noted -Echo on 11/20/2022 suggest severe pulmonary hypertension with RA gradient of 70 mmHg -I suspect that patient's pulm hypertension is probably related to OSA -Lower extremity Dopplers without DVT  5)Acute hypoxic respiratory failure--- secondary to #1 #3 and #4 above -Currently requiring 2 to 2 L of oxygen via nasal cannula- 11/20/22 -Was unsuccessful with attempt to wean patient off oxygen O2 sats dropped down to 86% on room air currently back on 2 L by nasal  cannula  6)Morbid Obesity/OSA -Low calorie diet, portion control and increase physical activity discussed with patient -Body mass index is 48.39 kg/m. -CPAP nightly  7)DM2-  -Check A1c -Give scheduled Lantus insulin 10 units -Anticipate worsening hyperglycemia with steroids -Use Novolog/Humalog Sliding scale insulin with Accu-Cheks/Fingersticks as ordered  8)Chronic Neuropathic/MSK pain: Likely secondary in part due to obesity and DM - Continue Baclofen, Oxycodone, Magnesium, Neurontin  9)HTN--- continue Norvasc,  -Continue Imdur Hyzaar on hold  may use IV Hydralazine 10 mg  Every 4 hours Prn for systolic blood pressure over 160 mmhg   Disposition/Need for in-Hospital Stay- patient unable to be discharged at this time due to --worsening shortness of breath with hypoxia requiring IV diuresis and IV steroids supplemental oxygen -  Status is: Inpatient   Disposition: The patient is from: Home              Anticipated d/c is to: Home              Anticipated d/c date is: 2 days              Patient currently is not medically stable to d/c. Barriers: Not Clinically Stable-   Code Status :  -  Code Status: Full Code   Family Communication:    NA (patient is alert, awake and coherent)   DVT Prophylaxis  :   - SCDs   heparin injection 5,000 Units Start: 11/19/22 2200   Lab Results  Component Value Date   PLT 249 11/19/2022    Inpatient Medications  Scheduled Meds:  amLODipine  10 mg Oral QPM  aspirin  81 mg Oral Daily   fenofibrate  160 mg Oral Daily   [START ON 11/21/2022] furosemide  20 mg Intravenous Daily   gabapentin  200 mg Oral QID   heparin  5,000 Units Subcutaneous Q8H   insulin aspart  0-20 Units Subcutaneous TID WC   insulin aspart  0-5 Units Subcutaneous QHS   insulin glargine-yfgn  10 Units Subcutaneous Daily   ipratropium-albuterol  3 mL Nebulization TID   isosorbide mononitrate  30 mg Oral Daily   linaclotide  72 mcg Oral QAC breakfast   loratadine   10 mg Oral QPM   magnesium oxide  400 mg Oral Daily   [START ON 11/21/2022] methylPREDNISolone (SOLU-MEDROL) injection  40 mg Intravenous Q12H   mometasone-formoterol  2 puff Inhalation BID   oxybutynin  10 mg Oral Daily   potassium chloride SA  20 mEq Oral Daily   pravastatin  40 mg Oral Daily   Continuous Infusions: PRN Meds:.acetaminophen, albuterol, ALPRAZolam, oxyCODONE   Anti-infectives (From admission, onward)    None         Subjective: Sara Tran today has no fevers, no emesis,  No chest pain,   -\ Cough and shortness of breath persist -Significant dyspnea on exertion -Was unsuccessful with attempt to wean patient off oxygen O2 sats dropped down to 86% on room air currently back on 2 L by nasal cannula   Objective: Vitals:   11/20/22 0802 11/20/22 1028 11/20/22 1410 11/20/22 1539  BP: (!) 158/79  (!) 135/55   Pulse: 89  94   Resp: 18  20   Temp: 98.7 F (37.1 C)  98.5 F (36.9 C)   TempSrc: Oral  Oral   SpO2: 98% 98% 95% 96%  Weight:      Height:        Intake/Output Summary (Last 24 hours) at 11/20/2022 1926 Last data filed at 11/20/2022 1300 Gross per 24 hour  Intake 720 ml  Output 2400 ml  Net -1680 ml   Filed Weights   11/19/22 1400 11/19/22 1816 11/20/22 0700  Weight: 109.8 kg 111.5 kg 112.4 kg    Physical Exam  Gen:- Awake Alert, conversational dyspnea HEENT:- [place].AT, No sclera icterus Nose- [place] 2L/min Neck-Supple Neck,No JVD,.  Lungs-diminished breath sounds, faint bibasilar rales, no wheezing  CV- S1, S2 normal, regular  Abd-  +ve B.Sounds, Abd Soft, No tenderness, increased truncal adiposity    Extremity/Skin:- +ve  edema, pedal pulses present  Psych-affect is appropriate, oriented x3 Neuro-no new focal deficits, no tremors  Data Reviewed: I have personally reviewed following labs and imaging studies  CBC: Recent Labs  Lab 11/19/22 1428  WBC 11.5*  HGB 12.5  HCT 39.2  MCV 100.5*  PLT 476   Basic Metabolic  Panel: Recent Labs  Lab 11/19/22 1428 11/20/22 0256  NA 139 141  K 4.4 4.3  CL 105 108  CO2 26 27  GLUCOSE 214* 116*  BUN 35* 39*  CREATININE 2.28* 2.81*  CALCIUM 9.0 9.0  MG 2.2  --    GFR: Estimated Creatinine Clearance: 22.8 mL/min (A) (by C-G formula based on SCr of 2.81 mg/dL (H)). Liver Function Tests: Recent Labs  Lab 11/19/22 1428  AST 23  ALT 20  ALKPHOS 40  BILITOT 0.5  PROT 7.1  ALBUMIN 3.4*   Cardiac Enzymes: No results for input(s): "CKTOTAL", "CKMB", "CKMBINDEX", "TROPONINI" in the last 168 hours. BNP (last 3 results) No results for input(s): "PROBNP" in the last 8760 hours. HbA1C: No results  for input(s): "HGBA1C" in the last 72 hours. Sepsis Labs: '@LABRCNTIP'$ (procalcitonin:4,lacticidven:4) ) Recent Results (from the past 240 hour(s))  Resp Panel by RT-PCR (Flu A&B, Covid) Anterior Nasal Swab     Status: None   Collection Time: 11/19/22  2:12 PM   Specimen: Anterior Nasal Swab  Result Value Ref Range Status   SARS Coronavirus 2 by RT PCR NEGATIVE NEGATIVE Final    Comment: (NOTE) SARS-CoV-2 target nucleic acids are NOT DETECTED.  The SARS-CoV-2 RNA is generally detectable in upper respiratory specimens during the acute phase of infection. The lowest concentration of SARS-CoV-2 viral copies this assay can detect is 138 copies/mL. A negative result does not preclude SARS-Cov-2 infection and should not be used as the sole basis for treatment or other patient management decisions. A negative result may occur with  improper specimen collection/handling, submission of specimen other than nasopharyngeal swab, presence of viral mutation(s) within the areas targeted by this assay, and inadequate number of viral copies(<138 copies/mL). A negative result must be combined with clinical observations, patient history, and epidemiological information. The expected result is Negative.  Fact Sheet for Patients:   EntrepreneurPulse.com.au  Fact Sheet for Healthcare Providers:  IncredibleEmployment.be  This test is no t yet approved or cleared by the [place] FDA and  has been authorized for detection and/or diagnosis of SARS-CoV-2 by FDA under an Emergency Use Authorization (EUA). This EUA will remain  in effect (meaning this test can be used) for the duration of the COVID-19 declaration under Section 564(b)(1) of the Act, 21 U.S.C.section 360bbb-3(b)(1), unless the authorization is terminated  or revoked sooner.       Influenza A by PCR NEGATIVE NEGATIVE Final   Influenza B by PCR NEGATIVE NEGATIVE Final    Comment: (NOTE) The Xpert Xpress SARS-CoV-2/FLU/RSV plus assay is intended as an aid in the diagnosis of influenza from Nasopharyngeal swab specimens and should not be used as a sole basis for treatment. Nasal washings and aspirates are unacceptable for Xpert Xpress SARS-CoV-2/FLU/RSV testing.  Fact Sheet for Patients: EntrepreneurPulse.com.au  Fact Sheet for Healthcare Providers: IncredibleEmployment.be  This test is not yet approved or cleared by the [place] FDA and has been authorized for detection and/or diagnosis of SARS-CoV-2 by FDA under an Emergency Use Authorization (EUA). This EUA will remain in effect (meaning this test can be used) for the duration of the COVID-19 declaration under Section 564(b)(1) of the Act, 21 U.S.C. section 360bbb-3(b)(1), unless the authorization is terminated or revoked.  Performed at [facility], [address], [place], [place] [address]       Radiology Studies: ECHOCARDIOGRAM COMPLETE  Result Date: 11/20/2022    ECHOCARDIOGRAM REPORT   Patient Name:   DHAMAR GREGORY Date of Exam: 11/20/2022 Medical Rec #:  [id number]      Height:       60.0 in Accession #:    [id number]     Weight:       247.8 lb Date of Birth:  01-11-1956       BSA:          2.045 m Patient  Age:    80 years       BP:           158/79 mmHg Patient Gender: F              HR:           94 bpm. Exam Location:  [facility] Procedure: 2D Echo, Cardiac Doppler and Color Doppler  Indications:    CHF  History:        Patient has prior history of Echocardiogram examinations, most                 recent 11/28/2017. CHF, Pulmonary HTN, Signs/Symptoms:Chest Pain                 and Dyspnea; Risk Factors:Hypertension, Diabetes and                 Dyslipidemia.  Sonographer:    Wenda Low Referring Phys: [place]  Sonographer Comments: Patient is obese. IMPRESSIONS  1. Left ventricular ejection fraction, by estimation, is 60 to 65%. The left ventricle has normal function. The left ventricle has no regional wall motion abnormalities. There is moderate concentric left ventricular hypertrophy. Left ventricular diastolic parameters are indeterminate.  2. RV-RA gradient 70 mmHg consistent with severe pulmonary hypertension. Right ventricular systolic function is normal. The right ventricular size is normal.  3. The mitral valve is grossly normal. Trivial mitral valve regurgitation.  4. Tricuspid valve regurgitation is mild to moderate.  5. The aortic valve is tricuspid. Aortic valve regurgitation is not visualized. Aortic valve sclerosis is present, with no evidence of aortic valve stenosis. Aortic valve mean gradient measures 7.0 mmHg.  6. Unable to estimate CVP. Comparison(s): Prior images unable to be directly viewed. LVEF normal at 60-65% with indeterminate diastolic function. Evidence of severe pulmonary hypertension as noted (history of mild PAH as of 2021). FINDINGS  Left Ventricle: Left ventricular ejection fraction, by estimation, is 60 to 65%. The left ventricle has normal function. The left ventricle has no regional wall motion abnormalities. The left ventricular internal cavity size was normal in size. There is  moderate concentric left ventricular hypertrophy. Left ventricular diastolic  parameters are indeterminate. Right Ventricle: RV-RA gradient 70 mmHg consistent with severe pulmonary hypertension. The right ventricular size is normal. No increase in right ventricular wall thickness. Right ventricular systolic function is normal. Left Atrium: Left atrial size was normal in size. Right Atrium: Right atrial size was normal in size. Pericardium: There is no evidence of pericardial effusion. Presence of epicardial fat layer. Mitral Valve: The mitral valve is grossly normal. Mild to moderate mitral annular calcification. Trivial mitral valve regurgitation. MV peak gradient, 5.8 mmHg. The mean mitral valve gradient is 2.5 mmHg. Tricuspid Valve: The tricuspid valve is grossly normal. Tricuspid valve regurgitation is mild to moderate. Aortic Valve: The aortic valve is tricuspid. There is mild aortic valve annular calcification. Aortic valve regurgitation is not visualized. Aortic valve sclerosis is present, with no evidence of aortic valve stenosis. Aortic valve mean gradient measures  7.0 mmHg. Aortic valve peak gradient measures 14.3 mmHg. Aortic valve area, by VTI measures 2.14 cm. Pulmonic Valve: The pulmonic valve was grossly normal. Pulmonic valve regurgitation is trivial. Aorta: The aortic root is normal in size and structure. Venous: Unable to estimate CVP. The inferior vena cava was not well visualized. IAS/Shunts: No atrial level shunt detected by color flow Doppler.  LEFT VENTRICLE PLAX 2D LVIDd:         4.00 cm   Diastology LVIDs:         2.50 cm   LV e' medial:    7.07 cm/s LV PW:         1.40 cm   LV E/e' medial:  11.8 LV IVS:        1.40 cm   LV e' lateral:   11.20 cm/s LVOT diam:  2.00 cm   LV E/e' lateral: 7.5 LV SV:         76 LV SV Index:   37 LVOT Area:     3.14 cm  RIGHT VENTRICLE RV Basal diam:  3.55 cm RV Mid diam:    2.80 cm RV S prime:     12.30 cm/s TAPSE (M-mode): 2.3 cm LEFT ATRIUM             Index        RIGHT ATRIUM           Index LA diam:        4.60 cm 2.25 cm/m    RA Area:     15.90 cm LA Vol (A2C):   37.8 ml 18.49 ml/m  RA Volume:   43.10 ml  21.08 ml/m LA Vol (A4C):   40.2 ml 19.66 ml/m LA Biplane Vol: 42.3 ml 20.69 ml/m  AORTIC VALVE                     PULMONIC VALVE AV Area (Vmax):    1.98 cm      PV Vmax:       0.89 m/s AV Area (Vmean):   2.06 cm      PV Peak grad:  3.2 mmHg AV Area (VTI):     2.14 cm AV Vmax:           189.00 cm/s AV Vmean:          119.000 cm/s AV VTI:            0.356 m AV Peak Grad:      14.3 mmHg AV Mean Grad:      7.0 mmHg LVOT Vmax:         119.00 cm/s LVOT Vmean:        78.000 cm/s LVOT VTI:          0.242 m LVOT/AV VTI ratio: 0.68  AORTA Ao Root diam: 3.40 cm MITRAL VALVE               TRICUSPID VALVE MV Area (PHT): 3.53 cm    TR Peak grad:   69.6 mmHg MV Area VTI:   3.15 cm    TR Vmax:        417.00 cm/s MV Peak grad:  5.8 mmHg MV Mean grad:  2.5 mmHg    SHUNTS MV Vmax:       1.20 m/s    Systemic VTI:  0.24 m MV Vmean:      69.8 cm/s   Systemic Diam: 2.00 cm MV Decel Time: 215 msec MV E velocity: 83.60 cm/s MV A velocity: 97.80 cm/s MV E/A ratio:  0.85 Rozann Lesches MD Electronically signed by Rozann Lesches MD Signature Date/Time: 11/20/2022/10:52:27 AM    Final    US Venous Img Lower Unilateral Left  Result Date: 11/19/2022 CLINICAL DATA:  Leg swelling EXAM: LEFT LOWER EXTREMITY VENOUS DOPPLER ULTRASOUND TECHNIQUE: Gray-scale sonography with compression, as well as color and duplex ultrasound, were performed to evaluate the deep venous system(s) from the level of the common femoral vein through the popliteal and proximal calf veins. COMPARISON:  None Available. FINDINGS: VENOUS Normal compressibility of the common femoral, superficial femoral, and popliteal veins, as well as the visualized calf veins. Visualized portions of profunda femoral vein and great saphenous vein unremarkable. No filling defects to suggest DVT on grayscale or color Doppler imaging. Doppler waveforms show normal direction of venous flow, normal  respiratory plasticity  and response to augmentation. Limited views of the contralateral common femoral vein are unremarkable. OTHER None. Limitations: none IMPRESSION: Negative. Electronically Signed   By: Jacqulynn Cadet M.D.   On: 11/19/2022 15:51   DG Chest Port 1 View  Result Date: 11/19/2022 CLINICAL DATA:  Weakness, chest chest pain EXAM: PORTABLE CHEST 1 VIEW COMPARISON:  Portable exam 1431 hours compared to 12/21/2020 FINDINGS: Enlargement of cardiac silhouette. Mediastinal contours and pulmonary vascularity normal. Rotated to the LEFT. Mild RIGHT basilar atelectasis. Remaining lungs clear without pulmonary infiltrate, pleural effusion, or pneumothorax. IMPRESSION: Enlargement of cardiac silhouette with mild RIGHT basilar atelectasis. Electronically Signed   By: Lavonia Dana M.D.   On: 11/19/2022 14:46     Scheduled Meds:  amLODipine  10 mg Oral QPM   aspirin  81 mg Oral Daily   fenofibrate  160 mg Oral Daily   [START ON 11/21/2022] furosemide  20 mg Intravenous Daily   gabapentin  200 mg Oral QID   heparin  5,000 Units Subcutaneous Q8H   insulin aspart  0-20 Units Subcutaneous TID WC   insulin aspart  0-5 Units Subcutaneous QHS   insulin glargine-yfgn  10 Units Subcutaneous Daily   ipratropium-albuterol  3 mL Nebulization TID   isosorbide mononitrate  30 mg Oral Daily   linaclotide  72 mcg Oral QAC breakfast   loratadine  10 mg Oral QPM   magnesium oxide  400 mg Oral Daily   [START ON 11/21/2022] methylPREDNISolone (SOLU-MEDROL) injection  40 mg Intravenous Q12H   mometasone-formoterol  2 puff Inhalation BID   oxybutynin  10 mg Oral Daily   potassium chloride SA  20 mEq Oral Daily   pravastatin  40 mg Oral Daily   Continuous Infusions:   LOS: 1 day    Roxan Hockey M.D on 11/20/2022 at 7:26 PM  Go to [email or web address] - for contact info  [facility] - Office  [phone]  If 7PM-7AM, please contact night-coverage [email or web address] 11/20/2022, 7:26 PM

## 2022-11-20 NOTE — Plan of Care (Signed)
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
## 2022-11-20 NOTE — Progress Notes (Signed)
Assumed care of pt. Pt sleeping soundly, resp even and non-labored. Skin warm and dry, radial pulse moderate and regular.

## 2022-11-20 NOTE — Plan of Care (Signed)

## 2022-11-20 NOTE — Progress Notes (Signed)
Pt setup on CPAP medium full face mask of 7.5 of with 2lpm bleed in pt tolerating well machine in red outlet

## 2022-11-20 NOTE — Progress Notes (Signed)
*  PRELIMINARY RESULTS* Echocardiogram 2D Echocardiogram has been performed.  Sara Tran 11/20/2022, 9:26 AM

## 2022-11-21 DIAGNOSIS — N184 Chronic kidney disease, stage 4 (severe): Secondary | ICD-10-CM

## 2022-11-21 LAB — CBC
HCT: 35.7 % — ABNORMAL LOW (ref 36.0–46.0)
Hemoglobin: 11.9 g/dL — ABNORMAL LOW (ref 12.0–15.0)
MCH: 32.6 pg (ref 26.0–34.0)
MCHC: 33.3 g/dL (ref 30.0–36.0)
MCV: 97.8 fL (ref 80.0–100.0)
Platelets: 250 10*3/uL (ref 150–400)
RBC: 3.65 MIL/uL — ABNORMAL LOW (ref 3.87–5.11)
RDW: 11.9 % (ref 11.5–15.5)
WBC: 13.5 10*3/uL — ABNORMAL HIGH (ref 4.0–10.5)
nRBC: 0 % (ref 0.0–0.2)

## 2022-11-21 LAB — RENAL FUNCTION PANEL
Albumin: 3.3 g/dL — ABNORMAL LOW (ref 3.5–5.0)
Anion gap: 11 (ref 5–15)
BUN: 44 mg/dL — ABNORMAL HIGH (ref 8–23)
CO2: 25 mmol/L (ref 22–32)
Calcium: 9.2 mg/dL (ref 8.9–10.3)
Chloride: 100 mmol/L (ref 98–111)
Creatinine, Ser: 2.42 mg/dL — ABNORMAL HIGH (ref 0.44–1.00)
GFR, Estimated: 22 mL/min — ABNORMAL LOW (ref >60)
Glucose, Bld: 241 mg/dL — ABNORMAL HIGH (ref 70–99)
Phosphorus: 1.9 mg/dL — ABNORMAL LOW (ref 2.5–4.6)
Potassium: 5.3 mmol/L — ABNORMAL HIGH (ref 3.5–5.1)
Sodium: 136 mmol/L (ref 135–145)

## 2022-11-21 LAB — GLUCOSE, CAPILLARY
Glucose-Capillary: 298 mg/dL — ABNORMAL HIGH (ref 70–99)
Glucose-Capillary: 246 mg/dL — ABNORMAL HIGH (ref 70–99)
Glucose-Capillary: 136 mg/dL — ABNORMAL HIGH (ref 70–99)
Glucose-Capillary: 244 mg/dL — ABNORMAL HIGH (ref 70–99)
Glucose-Capillary: 210 mg/dL — ABNORMAL HIGH (ref 70–99)

## 2022-11-21 LAB — HEMOGLOBIN A1C
Hgb A1c MFr Bld: 6.1 % — ABNORMAL HIGH (ref 4.8–5.6)
Mean Plasma Glucose: 128 mg/dL

## 2022-11-21 MED ORDER — VITAMIN B-12 1000 MCG PO TABS
1000.0000 ug | ORAL_TABLET | Freq: Every day | ORAL | Status: DC
  Administered 2022-11-21: 1000 ug via ORAL
  Filled 2022-11-21 (×3): qty 1

## 2022-11-21 MED ORDER — INSULIN GLARGINE-YFGN 100 UNIT/ML ~~LOC~~ SOLN
15.0000 [IU] | Freq: Once | SUBCUTANEOUS | Status: AC
  Administered 2022-11-21: 15 [IU] via SUBCUTANEOUS
  Filled 2022-11-21: qty 0.15

## 2022-11-21 MED ORDER — CYCLOSPORINE 0.05 % OP EMUL
1.0000 [drp] | Freq: Two times a day (BID) | OPHTHALMIC | Status: DC
  Administered 2022-11-21 – 2022-11-23 (×5): 1 [drp] via OPHTHALMIC
  Filled 2022-11-21 (×4): qty 30

## 2022-11-21 MED ORDER — INSULIN ASPART 100 UNIT/ML IJ SOLN
12.0000 [IU] | Freq: Three times a day (TID) | INTRAMUSCULAR | Status: DC
  Administered 2022-11-21 – 2022-11-23 (×5): 12 [IU] via SUBCUTANEOUS

## 2022-11-21 MED ORDER — FUROSEMIDE 10 MG/ML IJ SOLN
30.0000 mg | Freq: Once | INTRAMUSCULAR | Status: AC
  Administered 2022-11-21: 30 mg via INTRAVENOUS
  Filled 2022-11-21: qty 4

## 2022-11-21 MED ORDER — INSULIN GLARGINE-YFGN 100 UNIT/ML ~~LOC~~ SOLN
30.0000 [IU] | Freq: Every day | SUBCUTANEOUS | Status: DC
  Administered 2022-11-22 – 2022-11-23 (×2): 30 [IU] via SUBCUTANEOUS
  Filled 2022-11-21 (×3): qty 0.3

## 2022-11-21 NOTE — Care Management Important Message (Signed)
Important Message  Patient Details  Name: Sara Tran MRN: [id number] Date of Birth: [birth year]   Medicare Important Message Given:  N/A - LOS <3 / Initial given by admissions     [name] 11/21/2022, 10:38 AM

## 2022-11-21 NOTE — Progress Notes (Signed)
PT has been alert and oriented throughout the day. PT has been up and down to the bathroom with a stand by assist. Pt was ambulated in the hallway with NT. PT did become SOB on exertion with a HR of 140s. MD was  notified. PT has complained of back pain and received PRN oxy x2 this shift. Pt has ate all three meals today without any complication. PT is now resting in her bed with call bell within reach.

## 2022-11-21 NOTE — Progress Notes (Signed)
SATURATION QUALIFICATIONS: (This note is used to comply with regulatory documentation for home oxygen)  Patient Saturations on Room Air at Rest = 90%  Patient Saturations on Room Air while Ambulating = 87%  Patient Saturations on 2 Liters of oxygen while Ambulating = 94%  Please briefly explain why patient needs home oxygen: Pts o2 dropped below 90% upon ambulation pt became very fatigued and SOB while ambulating. HR also maintained 140s upon ambulation.

## 2022-11-21 NOTE — Inpatient Diabetes Management (Signed)
Inpatient Diabetes Program Recommendations  AACE/ADA: New Consensus Statement on Inpatient Glycemic Control (2015)  Target Ranges:  Prepandial:   less than 140 mg/dL      Peak postprandial:   less than 180 mg/dL (1-2 hours)      Critically ill patients:  140 - 180 mg/dL   Lab Results  Component Value Date   GLUCAP 298 (H) 11/21/2022   HGBA1C 6.1 (H) 11/19/2022    Review of Glycemic Control  Latest Reference Range & Units 11/20/22 07:40 11/20/22 11:17 11/20/22 16:21 11/20/22 19:24 11/21/22 07:41  Glucose-Capillary 70 - 99 mg/dL 100 (H) 152 (H) 168 (H) 239 (H) 298 (H)   Diabetes history: DM 2 Outpatient Diabetes medications: Trulicity 1.5 mg weekly Current orders for Inpatient glycemic control:  Semglee 10 units Novolog 0-20 units tid + hs  A1c 6.1% on 11/20 Elevated renal function Solumedrol 40 mg Q12 hours  Inpatient Diabetes Program Recommendations:    -  May consider increasing Semglee to 14 units -  May consider adding Novolog 4 units tid meal coverage if eating >50% of meals.  Thanks, Tama Headings RN, MSN, BC-ADM Inpatient Diabetes Coordinator Team Pager [phone] (8a-5p)

## 2022-11-21 NOTE — Progress Notes (Signed)
PROGRESS NOTE   Sara Tran  OVF:[id number] DOB: 11-24-1956 DOA: 11/19/2022 PCP: Sharilyn Sites, MD   Chief Complaint  Patient presents with   Chest Pain   Level of care: Telemetry  Brief Admission History:  66 y.o. female with medical history significant of DCHF, COPD, Afib, Anxiety, CKD, Obesity, MI x3, DM, HTN. Pt presenting after 1 wk h/o progressive fatigue, weakness and SOB. No home O2. EMS called ot home and found pt w/ an O2 sat in the 80s. Pt states she reduced her Lasix by half 2 wks prior to admission as she felt "I was doing really well and thought I could come down on my medication." Denies fevers, n/v, ABD pain.  Chest discomfort endorsed at tim eof EMS evaluation was stated to be different from her MI CP. Pt noting fluid retention over the past week. No other changes in medications or other chronic medical conditions.    Assessment and Plan:  Acute HFpEF - PT responding favorably to IV lasix diuresis - continue for additional 24 hours - reassess in AM  AKI on CKD stage IV - creatinine stable on IV diuresis - follow BMP   COPD and asthma with acute exacerbation  - continue IV steroids and bronchodilators  Severe Pulmonary Hypertension  - discussed with cardiology, recommend outpatient follow up with Dr. Haroldine Laws advanced HF clinic (she is established); amb referral made for her to get an appt - counseled patient on regular CPAP use (Pt verbalized understanding)  Acute respiratory failure with hypoxia  - improved with supplemental oxygen and supportive measures  OSA/OHS - pt admits poor compliance with nightly CPAP - counseled patient on regular CPAP use  Type 2 DM uncontrolled with hyperglycemia - steroid induced hyperglycemia  - adding basal bolus treatment  - SSI coverage updated    DVT prophylaxis: [place] heparin  Code Status: Full  Family Communication:  Disposition: Status is: Inpatient Remains inpatient appropriate because: IV steroid    Consultants:   Procedures:   Antimicrobials:    Subjective: Pt says she rarely wears her CPAP nightly for 6 hours as recommended. She is still SOB but overall starting to improve.    Objective: Vitals:   11/21/22 0327 11/21/22 0709 11/21/22 0909 11/21/22 0923  BP: (!) 147/79  136/66   Pulse: 85  96   Resp: '18  14 17  '$ Temp: 97.7 F (36.5 C)  98.4 F (36.9 C)   TempSrc: Oral  Oral   SpO2: 100% 99% 97% 100%  Weight: 111.4 kg     Height:        Intake/Output Summary (Last 24 hours) at 11/21/2022 1335 Last data filed at 11/21/2022 1247 Gross per 24 hour  Intake 1200 ml  Output 2100 ml  Net -900 ml   Filed Weights   11/19/22 1816 11/20/22 0700 11/21/22 0327  Weight: 111.5 kg 112.4 kg 111.4 kg   Examination:  General exam: Appears calm and comfortable, NAD.  Respiratory system: Clear to auscultation. Respiratory effort normal. Cardiovascular system: normal S1 & S2 heard. No JVD, murmurs, rubs, gallops or clicks. No pedal edema. Gastrointestinal system: Abdomen is nondistended, soft and nontender. No organomegaly or masses felt. Normal bowel sounds heard. Central nervous system: Alert and oriented. No focal neurological deficits. Extremities: Symmetric 5 x 5 power. Skin: No rashes, lesions or ulcers. Psychiatry: Judgement and insight appear normal. Mood & affect appropriate.   Data Reviewed: I have personally reviewed following labs and imaging studies  CBC: Recent Labs  Lab 11/19/22  1428 11/21/22 0250  WBC 11.5* 13.5*  HGB 12.5 11.9*  HCT 39.2 35.7*  MCV 100.5* 97.8  PLT 249 540    Basic Metabolic Panel: Recent Labs  Lab 11/19/22 1428 11/20/22 0256 11/21/22 0250  NA 139 141 136  K 4.4 4.3 5.3*  CL 105 108 100  CO2 '26 27 25  '$ GLUCOSE 214* 116* 241*  BUN 35* 39* 44*  CREATININE 2.28* 2.81* 2.42*  CALCIUM 9.0 9.0 9.2  MG 2.2  --   --   PHOS  --   --  1.9*    CBG: Recent Labs  Lab 11/20/22 1117 11/20/22 1621 11/20/22 1924 11/21/22 0741  11/21/22 1130  GLUCAP 152* 168* 239* 298* 246*    Recent Results (from the past 240 hour(s))  Resp Panel by RT-PCR (Flu A&B, Covid) Anterior Nasal Swab     Status: None   Collection Time: 11/19/22  2:12 PM   Specimen: Anterior Nasal Swab  Result Value Ref Range Status   SARS Coronavirus 2 by RT PCR NEGATIVE NEGATIVE Final    Comment: (NOTE) SARS-CoV-2 target nucleic acids are NOT DETECTED.  The SARS-CoV-2 RNA is generally detectable in upper respiratory specimens during the acute phase of infection. The lowest concentration of SARS-CoV-2 viral copies this assay can detect is 138 copies/mL. A negative result does not preclude SARS-Cov-2 infection and should not be used as the sole basis for treatment or other patient management decisions. A negative result may occur with  improper specimen collection/handling, submission of specimen other than nasopharyngeal swab, presence of viral mutation(s) within the areas targeted by this assay, and inadequate number of viral copies(<138 copies/mL). A negative result must be combined with clinical observations, patient history, and epidemiological information. The expected result is Negative.  Fact Sheet for Patients:  EntrepreneurPulse.com.au  Fact Sheet for Healthcare Providers:  IncredibleEmployment.be  This test is no t yet approved or cleared by the [place] FDA and  has been authorized for detection and/or diagnosis of SARS-CoV-2 by FDA under an Emergency Use Authorization (EUA). This EUA will remain  in effect (meaning this test can be used) for the duration of the COVID-19 declaration under Section 564(b)(1) of the Act, 21 U.S.C.section 360bbb-3(b)(1), unless the authorization is terminated  or revoked sooner.       Influenza A by PCR NEGATIVE NEGATIVE Final   Influenza B by PCR NEGATIVE NEGATIVE Final    Comment: (NOTE) The Xpert Xpress SARS-CoV-2/FLU/RSV plus assay is intended as an  aid in the diagnosis of influenza from Nasopharyngeal swab specimens and should not be used as a sole basis for treatment. Nasal washings and aspirates are unacceptable for Xpert Xpress SARS-CoV-2/FLU/RSV testing.  Fact Sheet for Patients: EntrepreneurPulse.com.au  Fact Sheet for Healthcare Providers: IncredibleEmployment.be  This test is not yet approved or cleared by the [place] FDA and has been authorized for detection and/or diagnosis of SARS-CoV-2 by FDA under an Emergency Use Authorization (EUA). This EUA will remain in effect (meaning this test can be used) for the duration of the COVID-19 declaration under Section 564(b)(1) of the Act, 21 U.S.C. section 360bbb-3(b)(1), unless the authorization is terminated or revoked.  Performed at [facility], [address], [place],  [address]      Radiology Studies: ECHOCARDIOGRAM COMPLETE  Result Date: 11/20/2022    ECHOCARDIOGRAM REPORT   Patient Name:   [name] Date of Exam: 11/20/2022 Medical Rec #:  [id number]      Height:  60.0 in Accession #:    [id number]     Weight:       247.8 lb Date of Birth:  [birth year]       BSA:          2.045 m Patient Age:    61 years       BP:           158/79 mmHg Patient Gender: F              HR:           94 bpm. Exam Location:  [facility] Procedure: 2D Echo, Cardiac Doppler and Color Doppler Indications:    CHF  History:        Patient has prior history of Echocardiogram examinations, most                 recent 11/28/2017. CHF, Pulmonary HTN, Signs/Symptoms:Chest Pain                 and Dyspnea; Risk Factors:Hypertension, Diabetes and                 Dyslipidemia.  Sonographer:    Wenda Low Referring Phys: [place]  Sonographer Comments: Patient is obese. IMPRESSIONS  1. Left ventricular ejection fraction, by estimation, is 60 to 65%. The left ventricle has normal function. The left ventricle has no regional wall motion  abnormalities. There is moderate concentric left ventricular hypertrophy. Left ventricular diastolic parameters are indeterminate.  2. RV-RA gradient 70 mmHg consistent with severe pulmonary hypertension. Right ventricular systolic function is normal. The right ventricular size is normal.  3. The mitral valve is grossly normal. Trivial mitral valve regurgitation.  4. Tricuspid valve regurgitation is mild to moderate.  5. The aortic valve is tricuspid. Aortic valve regurgitation is not visualized. Aortic valve sclerosis is present, with no evidence of aortic valve stenosis. Aortic valve mean gradient measures 7.0 mmHg.  6. Unable to estimate CVP. Comparison(s): Prior images unable to be directly viewed. LVEF normal at 60-65% with indeterminate diastolic function. Evidence of severe pulmonary hypertension as noted (history of mild PAH as of 2021). FINDINGS  Left Ventricle: Left ventricular ejection fraction, by estimation, is 60 to 65%. The left ventricle has normal function. The left ventricle has no regional wall motion abnormalities. The left ventricular internal cavity size was normal in size. There is  moderate concentric left ventricular hypertrophy. Left ventricular diastolic parameters are indeterminate. Right Ventricle: RV-RA gradient 70 mmHg consistent with severe pulmonary hypertension. The right ventricular size is normal. No increase in right ventricular wall thickness. Right ventricular systolic function is normal. Left Atrium: Left atrial size was normal in size. Right Atrium: Right atrial size was normal in size. Pericardium: There is no evidence of pericardial effusion. Presence of epicardial fat layer. Mitral Valve: The mitral valve is grossly normal. Mild to moderate mitral annular calcification. Trivial mitral valve regurgitation. MV peak gradient, 5.8 mmHg. The mean mitral valve gradient is 2.5 mmHg. Tricuspid Valve: The tricuspid valve is grossly normal. Tricuspid valve regurgitation is mild to  moderate. Aortic Valve: The aortic valve is tricuspid. There is mild aortic valve annular calcification. Aortic valve regurgitation is not visualized. Aortic valve sclerosis is present, with no evidence of aortic valve stenosis. Aortic valve mean gradient measures  7.0 mmHg. Aortic valve peak gradient measures 14.3 mmHg. Aortic valve area, by VTI measures 2.14 cm. Pulmonic Valve: The pulmonic valve was grossly normal. Pulmonic valve regurgitation is trivial. Aorta: The  aortic root is normal in size and structure. Venous: Unable to estimate CVP. The inferior vena cava was not well visualized. IAS/Shunts: No atrial level shunt detected by color flow Doppler.  LEFT VENTRICLE PLAX 2D LVIDd:         4.00 cm   Diastology LVIDs:         2.50 cm   LV e' medial:    7.07 cm/s LV PW:         1.40 cm   LV E/e' medial:  11.8 LV IVS:        1.40 cm   LV e' lateral:   11.20 cm/s LVOT diam:     2.00 cm   LV E/e' lateral: 7.5 LV SV:         76 LV SV Index:   37 LVOT Area:     3.14 cm  RIGHT VENTRICLE RV Basal diam:  3.55 cm RV Mid diam:    2.80 cm RV S prime:     12.30 cm/s TAPSE (M-mode): 2.3 cm LEFT ATRIUM             Index        RIGHT ATRIUM           Index LA diam:        4.60 cm 2.25 cm/m   RA Area:     15.90 cm LA Vol (A2C):   37.8 ml 18.49 ml/m  RA Volume:   43.10 ml  21.08 ml/m LA Vol (A4C):   40.2 ml 19.66 ml/m LA Biplane Vol: 42.3 ml 20.69 ml/m  AORTIC VALVE                     PULMONIC VALVE AV Area (Vmax):    1.98 cm      PV Vmax:       0.89 m/s AV Area (Vmean):   2.06 cm      PV Peak grad:  3.2 mmHg AV Area (VTI):     2.14 cm AV Vmax:           189.00 cm/s AV Vmean:          119.000 cm/s AV VTI:            0.356 m AV Peak Grad:      14.3 mmHg AV Mean Grad:      7.0 mmHg LVOT Vmax:         119.00 cm/s LVOT Vmean:        78.000 cm/s LVOT VTI:          0.242 m LVOT/AV VTI ratio: 0.68  AORTA Ao Root diam: 3.40 cm MITRAL VALVE               TRICUSPID VALVE MV Area (PHT): 3.53 cm    TR Peak grad:   69.6 mmHg MV  Area VTI:   3.15 cm    TR Vmax:        417.00 cm/s MV Peak grad:  5.8 mmHg MV Mean grad:  2.5 mmHg    SHUNTS MV Vmax:       1.20 m/s    Systemic VTI:  0.24 m MV Vmean:      69.8 cm/s   Systemic Diam: 2.00 cm MV Decel Time: 215 msec MV E velocity: 83.60 cm/s MV A velocity: 97.80 cm/s MV E/A ratio:  0.85 Rozann Lesches MD Electronically signed by Rozann Lesches MD Signature Date/Time: 11/20/2022/10:52:27 AM    Final  US Venous Img Lower Unilateral Left  Result Date: 11/19/2022 CLINICAL DATA:  Leg swelling EXAM: LEFT LOWER EXTREMITY VENOUS DOPPLER ULTRASOUND TECHNIQUE: Gray-scale sonography with compression, as well as color and duplex ultrasound, were performed to evaluate the deep venous system(s) from the level of the common femoral vein through the popliteal and proximal calf veins. COMPARISON:  None Available. FINDINGS: VENOUS Normal compressibility of the common femoral, superficial femoral, and popliteal veins, as well as the visualized calf veins. Visualized portions of profunda femoral vein and great saphenous vein unremarkable. No filling defects to suggest DVT on grayscale or color Doppler imaging. Doppler waveforms show normal direction of venous flow, normal respiratory plasticity and response to augmentation. Limited views of the contralateral common femoral vein are unremarkable. OTHER None. Limitations: none IMPRESSION: Negative. Electronically Signed   By: Jacqulynn Cadet M.D.   On: 11/19/2022 15:51   DG Chest Port 1 View  Result Date: 11/19/2022 CLINICAL DATA:  Weakness, chest chest pain EXAM: PORTABLE CHEST 1 VIEW COMPARISON:  Portable exam 1431 hours compared to 12/21/2020 FINDINGS: Enlargement of cardiac silhouette. Mediastinal contours and pulmonary vascularity normal. Rotated to the LEFT. Mild RIGHT basilar atelectasis. Remaining lungs clear without pulmonary infiltrate, pleural effusion, or pneumothorax. IMPRESSION: Enlargement of cardiac silhouette with mild RIGHT basilar  atelectasis. Electronically Signed   By: Lavonia Dana M.D.   On: 11/19/2022 14:46    Scheduled Meds:  amLODipine  10 mg Oral QPM   aspirin  81 mg Oral Daily   cyanocobalamin  1,000 mcg Oral Daily   cycloSPORINE  1 drop Both Eyes BID   fenofibrate  160 mg Oral Daily   furosemide  20 mg Intravenous Daily   gabapentin  200 mg Oral QID   heparin  5,000 Units Subcutaneous Q8H   insulin aspart  0-20 Units Subcutaneous TID WC   insulin aspart  0-5 Units Subcutaneous QHS   insulin glargine-yfgn  10 Units Subcutaneous Daily   ipratropium-albuterol  3 mL Nebulization TID   isosorbide mononitrate  30 mg Oral Daily   linaclotide  72 mcg Oral QAC breakfast   loratadine  10 mg Oral QPM   magnesium oxide  400 mg Oral Daily   methylPREDNISolone (SOLU-MEDROL) injection  40 mg Intravenous Q12H   mometasone-formoterol  2 puff Inhalation BID   oxybutynin  10 mg Oral Daily   pravastatin  40 mg Oral Daily   Continuous Infusions:   LOS: 2 days   Time spent: 36 mins  Kenetha Cozza Wynetta Emery, MD How to contact the [facility] Attending or Consulting provider [place] or covering provider during after hours [place], for this patient?  Check the care team in [facility] and look for a) attending/consulting [facility] provider listed and b) the [facility] team listed Log into [email or web address] and use [place]'s universal password to access. If you do not have the password, please contact the hospital operator. Locate the [facility] provider you are looking for under [facility] and page to a number that you can be directly reached. If you still have difficulty reaching the provider, please page the [facility] (Director on Call) for the Hospitalists listed on amion for assistance.  11/21/2022, 1:35 PM

## 2022-11-21 NOTE — Progress Notes (Signed)
Met with Sara Tran today and had conversation around HCPOA/Living Will. She elects her niece Sara Tran to be her [place]. In the event that she is unable to speak for herself and she has a condition that will result in her death with a relatively short period of time she DOES NOT want life prolonging measures. She does not want artificial nutrition or hydration at EOL either.  Sara Tran filled out the HCPOA/Living Will document and is awaiting notary/volunteers to complete paperwork. She also has instructions to complete this document outpatient if needed.   Rev. Bennie Pierini, M.Div. Chaplain

## 2022-11-21 NOTE — ACP (Advance Care Planning) (Signed)
Met with Lenell today and had conversation around HCPOA/Living Will. She elects her niece Radiah Cerice Spleen to be her [place]. In the event that she is unable to speak for herself and she has a condition that will result in her death with a relatively short period of time she DOES NOT want life prolonging measures. She does not want artificial nutrition or hydration at EOL either.  Ms. Wooden filled out the HCPOA/Living Will document and is awaiting notary/volunteers to complete paperwork. She also has instructions to complete this document outpatient if needed.   Rev. Bennie Pierini, M.Div. Chaplain

## 2022-11-22 LAB — GLUCOSE, CAPILLARY
Glucose-Capillary: 244 mg/dL — ABNORMAL HIGH (ref 70–99)
Glucose-Capillary: 259 mg/dL — ABNORMAL HIGH (ref 70–99)
Glucose-Capillary: 167 mg/dL — ABNORMAL HIGH (ref 70–99)
Glucose-Capillary: 229 mg/dL — ABNORMAL HIGH (ref 70–99)
Glucose-Capillary: 190 mg/dL — ABNORMAL HIGH (ref 70–99)

## 2022-11-22 LAB — BASIC METABOLIC PANEL
Anion gap: 10 (ref 5–15)
BUN: 53 mg/dL — ABNORMAL HIGH (ref 8–23)
CO2: 27 mmol/L (ref 22–32)
Calcium: 9.3 mg/dL (ref 8.9–10.3)
Chloride: 96 mmol/L — ABNORMAL LOW (ref 98–111)
Creatinine, Ser: 2.41 mg/dL — ABNORMAL HIGH (ref 0.44–1.00)
GFR, Estimated: 22 mL/min — ABNORMAL LOW (ref >60)
Glucose, Bld: 204 mg/dL — ABNORMAL HIGH (ref 70–99)
Potassium: 5.7 mmol/L — ABNORMAL HIGH (ref 3.5–5.1)
Sodium: 133 mmol/L — ABNORMAL LOW (ref 135–145)

## 2022-11-22 LAB — MAGNESIUM: Magnesium: 2.4 mg/dL (ref 1.7–2.4)

## 2022-11-22 MED ORDER — IPRATROPIUM-ALBUTEROL 0.5-2.5 (3) MG/3ML IN SOLN
3.0000 mL | Freq: Two times a day (BID) | RESPIRATORY_TRACT | Status: DC
  Administered 2022-11-22 – 2022-11-23 (×2): 3 mL via RESPIRATORY_TRACT
  Filled 2022-11-22 (×2): qty 3

## 2022-11-22 MED ORDER — METHYLPREDNISOLONE SODIUM SUCC 40 MG IJ SOLR
40.0000 mg | INTRAMUSCULAR | Status: DC
  Administered 2022-11-23: 40 mg via INTRAVENOUS
  Filled 2022-11-22: qty 1

## 2022-11-22 MED ORDER — FUROSEMIDE 10 MG/ML IJ SOLN
40.0000 mg | Freq: Every day | INTRAMUSCULAR | Status: DC
  Administered 2022-11-22 – 2022-11-23 (×2): 40 mg via INTRAVENOUS
  Filled 2022-11-22 (×2): qty 4

## 2022-11-22 MED ORDER — SODIUM ZIRCONIUM CYCLOSILICATE 10 G PO PACK
10.0000 g | PACK | Freq: Three times a day (TID) | ORAL | Status: DC
  Administered 2022-11-22 (×2): 10 g via ORAL
  Filled 2022-11-22 (×3): qty 1

## 2022-11-22 MED ORDER — SODIUM ZIRCONIUM CYCLOSILICATE 10 G PO PACK
10.0000 g | PACK | Freq: Three times a day (TID) | ORAL | Status: AC
  Administered 2022-11-22: 10 g via ORAL

## 2022-11-22 MED ORDER — GUAIFENESIN-DM 100-10 MG/5ML PO SYRP
5.0000 mL | ORAL_SOLUTION | ORAL | Status: DC | PRN
  Administered 2022-11-22 – 2022-11-23 (×3): 5 mL via ORAL
  Filled 2022-11-22 (×3): qty 5

## 2022-11-22 NOTE — Progress Notes (Addendum)
PROGRESS NOTE   TRACE CEDERBERG  ZLD:[id number] DOB: May 08, 1956 DOA: 11/19/2022 PCP: Sharilyn Sites, MD   Chief Complaint  Patient presents with   Chest Pain   Level of care: Telemetry  Brief Admission History:  66 y.o. female with medical history significant of DCHF, COPD, Afib, Anxiety, CKD, Obesity, MI x3, DM, HTN. Pt presenting after 1 wk h/o progressive fatigue, weakness and SOB. No home O2. EMS called ot home and found pt w/ an O2 sat in the 80s. Pt states she reduced her Lasix by half 2 wks prior to admission as she felt "I was doing really well and thought I could come down on my medication." Denies fevers, n/v, ABD pain.  Chest discomfort endorsed at tim eof EMS evaluation was stated to be different from her MI CP. Pt noting fluid retention over the past week. No other changes in medications or other chronic medical conditions.    Assessment and Plan:  Acute HFpEF - PT responding favorably to IV lasix diuresis - continue for additional 24 hours - continue diuresis; not back to baseline.   AKI on CKD stage IV - creatinine stable on IV diuresis - follow BMP   COPD and asthma with acute exacerbation  - reduce IV steroids and continue bronchodilators  Hyperkalemia - IV lasix given - lokelma ordered x 3 doses - Insulin ordered - recheck BMP in AM   Severe Pulmonary Hypertension  - discussed with cardiology, recommend outpatient follow up with Dr. Haroldine Laws advanced [facility] clinic (she is established); amb referral made for her to get an appt - counseled patient on regular CPAP use (Pt verbalized understanding)  Acute respiratory failure with hypoxia  - improved with supplemental oxygen and supportive measures  OSA/OHS - pt admits poor compliance with nightly CPAP - counseled patient on regular CPAP use  Type 2 DM uncontrolled with hyperglycemia - steroid induced hyperglycemia  - adding basal bolus treatment  - SSI coverage updated  CBG (last 3)  Recent Labs     11/22/22 0310 11/22/22 0701 11/22/22 1116  GLUCAP 244* 259* 167*   DVT prophylaxis: [place] heparin  Code Status: Full  Family Communication:  Disposition: Status is: Inpatient Remains inpatient appropriate because: IV steroid   Consultants:   Procedures:   Antimicrobials:    Subjective: Pt says that her breathing not back to baseline.  She is diuresing on lasix.  No CP.    Objective: Vitals:   11/22/22 0319 11/22/22 0500 11/22/22 0802 11/22/22 1236  BP: 137/61   136/74  Pulse: 80  86 86  Resp: '19  20 18  '$ Temp: 98.2 F (36.8 C)   98.6 F (37 C)  TempSrc:    Oral  SpO2: 100%  95% 100%  Weight:  111.5 kg    Height:        Intake/Output Summary (Last 24 hours) at 11/22/2022 1450 Last data filed at 11/22/2022 1100 Gross per 24 hour  Intake 1440 ml  Output 1000 ml  Net 440 ml   Filed Weights   11/20/22 0700 11/21/22 0327 11/22/22 0500  Weight: 112.4 kg 111.4 kg 111.5 kg   Examination:  General exam: Appears calm and comfortable, NAD.  Respiratory system: Clear to auscultation. Respiratory effort normal. Cardiovascular system: normal S1 & S2 heard. No JVD, murmurs, rubs, gallops or clicks. No pedal edema. Gastrointestinal system: Abdomen is nondistended, soft and nontender. No organomegaly or masses felt. Normal bowel sounds heard. Central nervous system: Alert and oriented. No focal neurological deficits. Extremities:  Symmetric 5 x 5 power. Skin: No rashes, lesions or ulcers. Psychiatry: Judgement and insight appear normal. Mood & affect appropriate.   Data Reviewed: I have personally reviewed following labs and imaging studies  CBC: Recent Labs  Lab 11/19/22 1428 11/21/22 0250  WBC 11.5* 13.5*  HGB 12.5 11.9*  HCT 39.2 35.7*  MCV 100.5* 97.8  PLT 249 983    Basic Metabolic Panel: Recent Labs  Lab 11/19/22 1428 11/20/22 0256 11/21/22 0250 11/22/22 0307  NA 139 141 136 133*  K 4.4 4.3 5.3* 5.7*  CL 105 108 100 96*  CO2 '26 27 25 27  '$ GLUCOSE 214*  116* 241* 204*  BUN 35* 39* 44* 53*  CREATININE 2.28* 2.81* 2.42* 2.41*  CALCIUM 9.0 9.0 9.2 9.3  MG 2.2  --   --  2.4  PHOS  --   --  1.9*  --     CBG: Recent Labs  Lab 11/21/22 2018 11/21/22 2135 11/22/22 0310 11/22/22 0701 11/22/22 1116  GLUCAP 244* 210* 244* 259* 167*    Recent Results (from the past 240 hour(s))  Resp Panel by RT-PCR (Flu A&B, Covid) Anterior Nasal Swab     Status: None   Collection Time: 11/19/22  2:12 PM   Specimen: Anterior Nasal Swab  Result Value Ref Range Status   SARS Coronavirus 2 by RT PCR NEGATIVE NEGATIVE Final    Comment: (NOTE) SARS-CoV-2 target nucleic acids are NOT DETECTED.  The SARS-CoV-2 RNA is generally detectable in upper respiratory specimens during the acute phase of infection. The lowest concentration of SARS-CoV-2 viral copies this assay can detect is 138 copies/mL. A negative result does not preclude SARS-Cov-2 infection and should not be used as the sole basis for treatment or other patient management decisions. A negative result may occur with  improper specimen collection/handling, submission of specimen other than nasopharyngeal swab, presence of viral mutation(s) within the areas targeted by this assay, and inadequate number of viral copies(<138 copies/mL). A negative result must be combined with clinical observations, patient history, and epidemiological information. The expected result is Negative.  Fact Sheet for Patients:  EntrepreneurPulse.com.au  Fact Sheet for Healthcare Providers:  IncredibleEmployment.be  This test is no t yet approved or cleared by the [place] FDA and  has been authorized for detection and/or diagnosis of SARS-CoV-2 by FDA under an Emergency Use Authorization (EUA). This EUA will remain  in effect (meaning this test can be used) for the duration of the COVID-19 declaration under Section 564(b)(1) of the Act, 21 U.S.C.section 360bbb-3(b)(1), unless  the authorization is terminated  or revoked sooner.       Influenza A by PCR NEGATIVE NEGATIVE Final   Influenza B by PCR NEGATIVE NEGATIVE Final    Comment: (NOTE) The Xpert Xpress SARS-CoV-2/FLU/RSV plus assay is intended as an aid in the diagnosis of influenza from Nasopharyngeal swab specimens and should not be used as a sole basis for treatment. Nasal washings and aspirates are unacceptable for Xpert Xpress SARS-CoV-2/FLU/RSV testing.  Fact Sheet for Patients: EntrepreneurPulse.com.au  Fact Sheet for Healthcare Providers: IncredibleEmployment.be  This test is not yet approved or cleared by the [place] FDA and has been authorized for detection and/or diagnosis of SARS-CoV-2 by FDA under an Emergency Use Authorization (EUA). This EUA will remain in effect (meaning this test can be used) for the duration of the COVID-19 declaration under Section 564(b)(1) of the Act, 21 U.S.C. section 360bbb-3(b)(1), unless the authorization is terminated or revoked.  Performed at [facility]  [facility], [address], [place], [place] [address]     Radiology Studies: No results found.  Scheduled Meds:  amLODipine  10 mg Oral QPM   aspirin  81 mg Oral Daily   cyanocobalamin  1,000 mcg Oral Daily   cycloSPORINE  1 drop Both Eyes BID   fenofibrate  160 mg Oral Daily   furosemide  40 mg Intravenous Daily   gabapentin  200 mg Oral QID   heparin  5,000 Units Subcutaneous Q8H   insulin aspart  0-20 Units Subcutaneous TID WC   insulin aspart  0-5 Units Subcutaneous QHS   insulin aspart  12 Units Subcutaneous TID WC   insulin glargine-yfgn  30 Units Subcutaneous Daily   ipratropium-albuterol  3 mL Nebulization BID   isosorbide mononitrate  30 mg Oral Daily   linaclotide  72 mcg Oral QAC breakfast   loratadine  10 mg Oral QPM   magnesium oxide  400 mg Oral Daily   [START ON 11/23/2022] methylPREDNISolone (SOLU-MEDROL) injection  40 mg Intravenous Q24H    mometasone-formoterol  2 puff Inhalation BID   pravastatin  40 mg Oral Daily   sodium zirconium cyclosilicate  10 g Oral TID   Continuous Infusions:   LOS: 3 days   Time spent: 35 mins  Kaeli Nichelson Wynetta Emery, MD How to contact the [facility] Attending or Consulting provider [place] or covering provider during after hours [place], for this patient?  Check the care team in [facility] and look for a) attending/consulting [facility] provider listed and b) the [facility] team listed Log into [email or web address] and use [place]'s universal password to access. If you do not have the password, please contact the hospital operator. Locate the [facility] provider you are looking for under [facility] and page to a number that you can be directly reached. If you still have difficulty reaching the provider, please page the [facility] (Director on Call) for the Hospitalists listed on amion for assistance.  11/22/2022, 2:50 PM

## 2022-11-23 DIAGNOSIS — G4733 Obstructive sleep apnea (adult) (pediatric): Secondary | ICD-10-CM

## 2022-11-23 LAB — BASIC METABOLIC PANEL
Anion gap: 9 (ref 5–15)
BUN: 56 mg/dL — ABNORMAL HIGH (ref 8–23)
CO2: 29 mmol/L (ref 22–32)
Calcium: 9 mg/dL (ref 8.9–10.3)
Chloride: 95 mmol/L — ABNORMAL LOW (ref 98–111)
Creatinine, Ser: 2.34 mg/dL — ABNORMAL HIGH (ref 0.44–1.00)
GFR, Estimated: 23 mL/min — ABNORMAL LOW (ref >60)
Glucose, Bld: 161 mg/dL — ABNORMAL HIGH (ref 70–99)
Potassium: 4.2 mmol/L (ref 3.5–5.1)
Sodium: 133 mmol/L — ABNORMAL LOW (ref 135–145)

## 2022-11-23 LAB — MAGNESIUM: Magnesium: 2.3 mg/dL (ref 1.7–2.4)

## 2022-11-23 LAB — GLUCOSE, CAPILLARY
Glucose-Capillary: 173 mg/dL — ABNORMAL HIGH (ref 70–99)
Glucose-Capillary: 152 mg/dL — ABNORMAL HIGH (ref 70–99)
Glucose-Capillary: 80 mg/dL (ref 70–99)

## 2022-11-23 MED ORDER — INSULIN ASPART 100 UNIT/ML IJ SOLN: 10.0000 [IU] | Freq: Three times a day (TID) | INTRAMUSCULAR | Status: DC

## 2022-11-23 MED ORDER — GABAPENTIN 100 MG PO CAPS: 200.0000 mg | ORAL_CAPSULE | Freq: Four times a day (QID) | ORAL | 1 refills | Status: AC

## 2022-11-23 MED ORDER — GUAIFENESIN-DM 100-10 MG/5ML PO SYRP: 5.0000 mL | ORAL_SOLUTION | ORAL | 0 refills | Status: DC | PRN

## 2022-11-23 MED ORDER — OXYBUTYNIN CHLORIDE ER 10 MG PO TB24: 10.0000 mg | ORAL_TABLET | Freq: Every day | ORAL | Status: AC | PRN

## 2022-11-23 NOTE — Discharge Summary (Addendum)
Physician Discharge Summary  OLAR SANTINI YSH:[id number] DOB: 1956-06-30 DOA: 11/19/2022  PCP: Sharilyn Sites, MD  Admit date: 11/19/2022 Discharge date: 11/23/2022  Admitted From:  HOME  Disposition: HOME   Recommendations for Outpatient Follow-up:  Follow up with PCP in 1 weeks Followup with Advanced Heart Failure clinic in 2 weeks Please obtain BMP in 1-2 weeks  Discharge Condition: STABLE   CODE STATUS: FULL  DIET: resume heart healthy   Brief Hospitalization Summary: Please see all hospital notes, images, labs for full details of the hospitalization. Brief Admission History:  66 y.o. female with medical history significant of DCHF, COPD, Afib, Anxiety, CKD, Obesity, MI x3, DM, HTN. Pt presenting after 1 wk h/o progressive fatigue, weakness and SOB. No home O2. EMS called ot home and found pt w/ an O2 sat in the 80s. Pt states she reduced her Lasix by half 2 wks prior to admission as she felt "I was doing really well and thought I could come down on my medication." Denies fevers, n/v, ABD pain.  Chest discomfort endorsed at tim eof EMS evaluation was stated to be different from her MI CP. Pt noting fluid retention over the past week. No other changes in medications or other chronic medical conditions.    Assessment and Plan:   Acute HFpEF - PT responded favorably to IV lasix diuresis and now resumed on home oral lasix / potassium - feels back to baseline, labs stable - followup outpatient in her advanced HF clinic in 2 weeks   AKI on CKD stage IV - creatinine stable on IV diuresis    COPD and asthma with acute exacerbation  - reduce IV steroids and continue bronchodilators   Hyperkalemia - treated and resolved now   Severe Pulmonary Hypertension  - discussed with cardiology, recommend outpatient follow up with Dr. Haroldine Laws advanced HF clinic (she is established); amb referral made for her to get an appt - counseled patient on regular CPAP use (Pt verbalized  understanding)   Acute respiratory failure with hypoxia  - improved with supplemental oxygen and supportive measures - did not qualify for home O2  - Pt has been ambulating well    OSA/OHS - pt admits poor compliance with nightly CPAP - counseled patient on regular CPAP use   Type 2 DM uncontrolled with hyperglycemia - steroid induced - resume home treatments now that she is off steroids CBG (last 3)  Recent Labs (last 2 labs)       Recent Labs    11/22/22 0310 11/22/22 0701 11/22/22 1116  GLUCAP 244* 259* 167*      DVT prophylaxis: [place] heparin  Code Status: Full  Family Communication:  Disposition: Status is: Inpatient Remains inpatient appropriate because: IV steroid   Discharge Diagnoses:  Principal Problem:   Acute respiratory failure with hypoxia ([place]) Active Problems:   Acute on chronic diastolic CHF (congestive heart failure) (HCC)   OSA on CPAP   Morbid obesity ([place])   Diabetes mellitus ([place])   IBS (irritable bowel syndrome)-DIARRHEA   Acute on chronic renal insufficiency   Essential hypertension   Musculoskeletal pain   Discharge Instructions: Discharge Instructions     AMB referral to CHF clinic   Complete by: As directed    Hospital follow up severe pulm HTN      Allergies as of 11/23/2022       Reactions   Ancef [cefazolin] Anaphylaxis, Shortness Of Breath      Bee Venom Anaphylaxis   As a child  Meloxicam    Gi upset    Ceftin [cefuroxime Axetil] Swelling   Swollen tongue   Chicken Meat (diagnostic) Nausea And Vomiting   Ciprofloxacin Cough   Coughed up blood   Macrodantin [nitrofurantoin] Other (See Comments)   Vomited blood and had dark stool.   Meat [alpha-gal] Nausea And Vomiting   Nsaids Other (See Comments)   Rectal bleeding    Other Other (See Comments)   Snake venom - Scaly Scalp, SOB, Night Terrors   Latex Itching, Rash   Milk-related Compounds Other (See Comments)   IBS        Medication List     STOP taking  these medications    baclofen 10 MG tablet Commonly known as: LIORESAL   gabapentin 600 MG tablet Commonly known as: NEURONTIN Replaced by: gabapentin 100 MG capsule   magnesium oxide 400 MG tablet Commonly known as: MAG-OX       TAKE these medications    albuterol (2.5 MG/3ML) 0.083% nebulizer solution Commonly known as: PROVENTIL Take 2.5 mg by nebulization every 4 (four) hours as needed for wheezing or shortness of breath.   albuterol 108 (90 Base) MCG/ACT inhaler Commonly known as: VENTOLIN HFA Inhale 2 puffs into the lungs every 6 (six) hours as needed for wheezing or shortness of breath.   ALPRAZolam 0.5 MG tablet Commonly known as: XANAX Take 0.5 mg by mouth daily as needed for anxiety.   amLODipine 10 MG tablet Commonly known as: NORVASC Take 10 mg by mouth every evening.   clobetasol 0.05 % external solution Commonly known as: TEMOVATE Apply 1 application topically daily as needed (scalp irritation).   cyanocobalamin 1000 MCG tablet Commonly known as: VITAMIN B12 Take 1,000 mcg by mouth daily.   EPINEPHrine 0.3 mg/0.3 mL Soaj injection Commonly known as: EPI-PEN Inject 0.3 mg into the muscle as needed for anaphylaxis.   fenofibrate 160 MG tablet Take 160 mg by mouth daily.   fluticasone 50 MCG/ACT nasal spray Commonly known as: FLONASE Place 1 spray into both nostrils daily.   furosemide 40 MG tablet Commonly known as: LASIX Take 40 mg by mouth daily.   gabapentin 100 MG capsule Commonly known as: NEURONTIN Take 2 capsules (200 mg total) by mouth 4 (four) times daily. Replaces: gabapentin 600 MG tablet   guaiFENesin-dextromethorphan 100-10 MG/5ML syrup Commonly known as: ROBITUSSIN DM Take 5 mLs by mouth every 4 (four) hours as needed for cough.   isosorbide mononitrate 30 MG 24 hr tablet Commonly known as: IMDUR Take 30 mg by mouth daily.   levocetirizine 5 MG tablet Commonly known as: XYZAL Take 5 mg by mouth at bedtime.    linaclotide 72 MCG capsule Commonly known as: Linzess Take 1 capsule (72 mcg total) by mouth daily before breakfast.   losartan-hydrochlorothiazide 100-25 MG tablet Commonly known as: HYZAAR Take 1 tablet by mouth daily.   multivitamin with minerals Tabs tablet Take 1 tablet by mouth daily.   nitroGLYCERIN 0.4 MG SL tablet Commonly known as: NITROSTAT Place 0.4 mg under the tongue every 5 (five) minutes as needed for chest pain.   ondansetron 4 MG tablet Commonly known as: ZOFRAN Take 4 mg by mouth every 8 (eight) hours as needed for nausea or vomiting.   oxybutynin 10 MG 24 hr tablet Commonly known as: DITROPAN-XL Take 1 tablet (10 mg total) by mouth daily as needed.   Oxycodone HCl 10 MG Tabs Take 10 mg by mouth every 6 (six) hours.   potassium chloride SA 20  MEQ tablet Commonly known as: KLOR-CON M Take 20 mEq by mouth daily.   pravastatin 40 MG tablet Commonly known as: PRAVACHOL Take 40 mg by mouth daily.   Restasis 0.05 % ophthalmic emulsion Generic drug: cycloSPORINE 1 drop 2 (two) times daily.   Symbicort 160-4.5 MCG/ACT inhaler Generic drug: budesonide-formoterol Inhale 2 puffs into the lungs 2 (two) times daily.   True Metrix Air Glucose Meter w/Device Kit   True Metrix Blood Glucose Test test strip Generic drug: glucose blood   True Metrix Level 2 Normal Soln   TRUEplus Lancets 76L Misc   Trulicity 1.5 YY/5.0PT Sopn Generic drug: Dulaglutide Inject 1.5 mg into the skin every 7 (seven) days.   Vitamin C 500 MG Caps Take 500 mg by mouth daily.   Vitamin D3 1.25 MG (50000 UT) Tabs Patient takes 1 tablet by mouth once a week.        Follow-up Information     Sharilyn Sites, MD. Schedule an appointment as soon as possible for a visit in 1 week(s).   Specialty: Family Medicine Why: Hospital Follow Up Contact information: [address] [place] [address] [phone]         Bensimhon, Shaune Pascal, MD. Schedule an appointment as  soon as possible for a visit in 2 week(s).   Specialty: Cardiology Why: Hospital Follow Up Contact information: [place] [address] [phone]                Allergies  Allergen Reactions   Ancef [Cefazolin] Anaphylaxis and Shortness Of Breath        Bee Venom Anaphylaxis    As a child   Meloxicam     Gi upset    Ceftin [Cefuroxime Axetil] Swelling    Swollen tongue   Chicken Meat (Diagnostic) Nausea And Vomiting   Ciprofloxacin Cough    Coughed up blood   Macrodantin [Nitrofurantoin] Other (See Comments)    Vomited blood and had dark stool.   Meat [Alpha-Gal] Nausea And Vomiting   Nsaids Other (See Comments)    Rectal bleeding    Other Other (See Comments)    Snake venom - Scaly Scalp, SOB, Night Terrors   Latex Itching and Rash   Milk-Related Compounds Other (See Comments)    IBS   Allergies as of 11/23/2022       Reactions   Ancef [cefazolin] Anaphylaxis, Shortness Of Breath      Bee Venom Anaphylaxis   As a child   Meloxicam    Gi upset    Ceftin [cefuroxime Axetil] Swelling   Swollen tongue   Chicken Meat (diagnostic) Nausea And Vomiting   Ciprofloxacin Cough   Coughed up blood   Macrodantin [nitrofurantoin] Other (See Comments)   Vomited blood and had dark stool.   Meat [alpha-gal] Nausea And Vomiting   Nsaids Other (See Comments)   Rectal bleeding    Other Other (See Comments)   Snake venom - Scaly Scalp, SOB, Night Terrors   Latex Itching, Rash   Milk-related Compounds Other (See Comments)   IBS        Medication List     STOP taking these medications    baclofen 10 MG tablet Commonly known as: LIORESAL   gabapentin 600 MG tablet Commonly known as: NEURONTIN Replaced by: gabapentin 100 MG capsule   magnesium oxide 400 MG tablet Commonly known as: MAG-OX       TAKE these medications    albuterol (2.5 MG/3ML) 0.083%  nebulizer solution Commonly known as: PROVENTIL Take 2.5 mg by  nebulization every 4 (four) hours as needed for wheezing or shortness of breath.   albuterol 108 (90 Base) MCG/ACT inhaler Commonly known as: VENTOLIN HFA Inhale 2 puffs into the lungs every 6 (six) hours as needed for wheezing or shortness of breath.   ALPRAZolam 0.5 MG tablet Commonly known as: XANAX Take 0.5 mg by mouth daily as needed for anxiety.   amLODipine 10 MG tablet Commonly known as: NORVASC Take 10 mg by mouth every evening.   clobetasol 0.05 % external solution Commonly known as: TEMOVATE Apply 1 application topically daily as needed (scalp irritation).   cyanocobalamin 1000 MCG tablet Commonly known as: VITAMIN B12 Take 1,000 mcg by mouth daily.   EPINEPHrine 0.3 mg/0.3 mL Soaj injection Commonly known as: EPI-PEN Inject 0.3 mg into the muscle as needed for anaphylaxis.   fenofibrate 160 MG tablet Take 160 mg by mouth daily.   fluticasone 50 MCG/ACT nasal spray Commonly known as: FLONASE Place 1 spray into both nostrils daily.   furosemide 40 MG tablet Commonly known as: LASIX Take 40 mg by mouth daily.   gabapentin 100 MG capsule Commonly known as: NEURONTIN Take 2 capsules (200 mg total) by mouth 4 (four) times daily. Replaces: gabapentin 600 MG tablet   guaiFENesin-dextromethorphan 100-10 MG/5ML syrup Commonly known as: ROBITUSSIN DM Take 5 mLs by mouth every 4 (four) hours as needed for cough.   isosorbide mononitrate 30 MG 24 hr tablet Commonly known as: IMDUR Take 30 mg by mouth daily.   levocetirizine 5 MG tablet Commonly known as: XYZAL Take 5 mg by mouth at bedtime.   linaclotide 72 MCG capsule Commonly known as: Linzess Take 1 capsule (72 mcg total) by mouth daily before breakfast.   losartan-hydrochlorothiazide 100-25 MG tablet Commonly known as: HYZAAR Take 1 tablet by mouth daily.   multivitamin with minerals Tabs tablet Take 1 tablet by mouth daily.   nitroGLYCERIN 0.4 MG SL tablet Commonly known as: NITROSTAT Place 0.4  mg under the tongue every 5 (five) minutes as needed for chest pain.   ondansetron 4 MG tablet Commonly known as: ZOFRAN Take 4 mg by mouth every 8 (eight) hours as needed for nausea or vomiting.   oxybutynin 10 MG 24 hr tablet Commonly known as: DITROPAN-XL Take 1 tablet (10 mg total) by mouth daily as needed.   Oxycodone HCl 10 MG Tabs Take 10 mg by mouth every 6 (six) hours.   potassium chloride SA 20 MEQ tablet Commonly known as: KLOR-CON M Take 20 mEq by mouth daily.   pravastatin 40 MG tablet Commonly known as: PRAVACHOL Take 40 mg by mouth daily.   Restasis 0.05 % ophthalmic emulsion Generic drug: cycloSPORINE 1 drop 2 (two) times daily.   Symbicort 160-4.5 MCG/ACT inhaler Generic drug: budesonide-formoterol Inhale 2 puffs into the lungs 2 (two) times daily.   True Metrix Air Glucose Meter w/Device Kit   True Metrix Blood Glucose Test test strip Generic drug: glucose blood   True Metrix Level 2 Normal Soln   TRUEplus Lancets 84Z Misc   Trulicity 1.5 YS/0.6TK Sopn Generic drug: Dulaglutide Inject 1.5 mg into the skin every 7 (seven) days.   Vitamin C 500 MG Caps Take 500 mg by mouth daily.   Vitamin D3 1.25 MG (50000 UT) Tabs Patient takes 1 tablet by mouth once a week.        Procedures/Studies: ECHOCARDIOGRAM COMPLETE  Result Date: 11/20/2022    ECHOCARDIOGRAM REPORT  Patient Name:   TYAH ACORD Date of Exam: 11/20/2022 Medical Rec #:  [id number]      Height:       60.0 in Accession #:    [id number]     Weight:       247.8 lb Date of Birth:  04/14/1956       BSA:          2.045 m Patient Age:    3 years       BP:           158/79 mmHg Patient Gender: F              HR:           94 bpm. Exam Location:  [facility] Procedure: 2D Echo, Cardiac Doppler and Color Doppler Indications:    CHF  History:        Patient has prior history of Echocardiogram examinations, most                 recent 11/28/2017. CHF, Pulmonary HTN, Signs/Symptoms:Chest Pain                  and Dyspnea; Risk Factors:Hypertension, Diabetes and                 Dyslipidemia.  Sonographer:    Wenda Low Referring Phys: [place]  Sonographer Comments: Patient is obese. IMPRESSIONS  1. Left ventricular ejection fraction, by estimation, is 60 to 65%. The left ventricle has normal function. The left ventricle has no regional wall motion abnormalities. There is moderate concentric left ventricular hypertrophy. Left ventricular diastolic parameters are indeterminate.  2. RV-RA gradient 70 mmHg consistent with severe pulmonary hypertension. Right ventricular systolic function is normal. The right ventricular size is normal.  3. The mitral valve is grossly normal. Trivial mitral valve regurgitation.  4. Tricuspid valve regurgitation is mild to moderate.  5. The aortic valve is tricuspid. Aortic valve regurgitation is not visualized. Aortic valve sclerosis is present, with no evidence of aortic valve stenosis. Aortic valve mean gradient measures 7.0 mmHg.  6. Unable to estimate CVP. Comparison(s): Prior images unable to be directly viewed. LVEF normal at 60-65% with indeterminate diastolic function. Evidence of severe pulmonary hypertension as noted (history of mild PAH as of 2021). FINDINGS  Left Ventricle: Left ventricular ejection fraction, by estimation, is 60 to 65%. The left ventricle has normal function. The left ventricle has no regional wall motion abnormalities. The left ventricular internal cavity size was normal in size. There is  moderate concentric left ventricular hypertrophy. Left ventricular diastolic parameters are indeterminate. Right Ventricle: RV-RA gradient 70 mmHg consistent with severe pulmonary hypertension. The right ventricular size is normal. No increase in right ventricular wall thickness. Right ventricular systolic function is normal. Left Atrium: Left atrial size was normal in size. Right Atrium: Right atrial size was normal in size. Pericardium: There is no  evidence of pericardial effusion. Presence of epicardial fat layer. Mitral Valve: The mitral valve is grossly normal. Mild to moderate mitral annular calcification. Trivial mitral valve regurgitation. MV peak gradient, 5.8 mmHg. The mean mitral valve gradient is 2.5 mmHg. Tricuspid Valve: The tricuspid valve is grossly normal. Tricuspid valve regurgitation is mild to moderate. Aortic Valve: The aortic valve is tricuspid. There is mild aortic valve annular calcification. Aortic valve regurgitation is not visualized. Aortic valve sclerosis is present, with no evidence of aortic valve stenosis. Aortic valve mean gradient measures  7.0 mmHg. Aortic valve  peak gradient measures 14.3 mmHg. Aortic valve area, by VTI measures 2.14 cm. Pulmonic Valve: The pulmonic valve was grossly normal. Pulmonic valve regurgitation is trivial. Aorta: The aortic root is normal in size and structure. Venous: Unable to estimate CVP. The inferior vena cava was not well visualized. IAS/Shunts: No atrial level shunt detected by color flow Doppler.  LEFT VENTRICLE PLAX 2D LVIDd:         4.00 cm   Diastology LVIDs:         2.50 cm   LV e' medial:    7.07 cm/s LV PW:         1.40 cm   LV E/e' medial:  11.8 LV IVS:        1.40 cm   LV e' lateral:   11.20 cm/s LVOT diam:     2.00 cm   LV E/e' lateral: 7.5 LV SV:         76 LV SV Index:   37 LVOT Area:     3.14 cm  RIGHT VENTRICLE RV Basal diam:  3.55 cm RV Mid diam:    2.80 cm RV S prime:     12.30 cm/s TAPSE (M-mode): 2.3 cm LEFT ATRIUM             Index        RIGHT ATRIUM           Index LA diam:        4.60 cm 2.25 cm/m   RA Area:     15.90 cm LA Vol (A2C):   37.8 ml 18.49 ml/m  RA Volume:   43.10 ml  21.08 ml/m LA Vol (A4C):   40.2 ml 19.66 ml/m LA Biplane Vol: 42.3 ml 20.69 ml/m  AORTIC VALVE                     PULMONIC VALVE AV Area (Vmax):    1.98 cm      PV Vmax:       0.89 m/s AV Area (Vmean):   2.06 cm      PV Peak grad:  3.2 mmHg AV Area (VTI):     2.14 cm AV Vmax:            189.00 cm/s AV Vmean:          119.000 cm/s AV VTI:            0.356 m AV Peak Grad:      14.3 mmHg AV Mean Grad:      7.0 mmHg LVOT Vmax:         119.00 cm/s LVOT Vmean:        78.000 cm/s LVOT VTI:          0.242 m LVOT/AV VTI ratio: 0.68  AORTA Ao Root diam: 3.40 cm MITRAL VALVE               TRICUSPID VALVE MV Area (PHT): 3.53 cm    TR Peak grad:   69.6 mmHg MV Area VTI:   3.15 cm    TR Vmax:        417.00 cm/s MV Peak grad:  5.8 mmHg MV Mean grad:  2.5 mmHg    SHUNTS MV Vmax:       1.20 m/s    Systemic VTI:  0.24 m MV Vmean:      69.8 cm/s   Systemic Diam: 2.00 cm MV Decel Time: 215 msec MV E velocity: 83.60 cm/s MV A  velocity: 97.80 cm/s MV E/A ratio:  0.85 Rozann Lesches MD Electronically signed by Rozann Lesches MD Signature Date/Time: 11/20/2022/10:52:27 AM    Final    US Venous Img Lower Unilateral Left  Result Date: 11/19/2022 CLINICAL DATA:  Leg swelling EXAM: LEFT LOWER EXTREMITY VENOUS DOPPLER ULTRASOUND TECHNIQUE: Gray-scale sonography with compression, as well as color and duplex ultrasound, were performed to evaluate the deep venous system(s) from the level of the common femoral vein through the popliteal and proximal calf veins. COMPARISON:  None Available. FINDINGS: VENOUS Normal compressibility of the common femoral, superficial femoral, and popliteal veins, as well as the visualized calf veins. Visualized portions of profunda femoral vein and great saphenous vein unremarkable. No filling defects to suggest DVT on grayscale or color Doppler imaging. Doppler waveforms show normal direction of venous flow, normal respiratory plasticity and response to augmentation. Limited views of the contralateral common femoral vein are unremarkable. OTHER None. Limitations: none IMPRESSION: Negative. Electronically Signed   By: Jacqulynn Cadet M.D.   On: 11/19/2022 15:51   DG Chest Port 1 View  Result Date: 11/19/2022 CLINICAL DATA:  Weakness, chest chest pain EXAM: PORTABLE CHEST 1 VIEW  COMPARISON:  Portable exam 1431 hours compared to 12/21/2020 FINDINGS: Enlargement of cardiac silhouette. Mediastinal contours and pulmonary vascularity normal. Rotated to the LEFT. Mild RIGHT basilar atelectasis. Remaining lungs clear without pulmonary infiltrate, pleural effusion, or pneumothorax. IMPRESSION: Enlargement of cardiac silhouette with mild RIGHT basilar atelectasis. Electronically Signed   By: Lavonia Dana M.D.   On: 11/19/2022 14:46     Subjective: Pt reporting that she is feeling much better, breathing better wanting to go home.   Discharge Exam: Vitals:   11/22/22 2122 11/23/22 0606  BP: (!) 130/58 (!) 144/66  Pulse: 88 81  Resp: 16 20  Temp: 97.9 F (36.6 C) 98.4 F (36.9 C)  SpO2: 94% 99%   Vitals:   11/22/22 1236 11/22/22 1933 11/22/22 2122 11/23/22 0606  BP: 136/74  (!) 130/58 (!) 144/66  Pulse: 86  88 81  Resp: _0 Temp: 98.6 F (37 C)  97.9 F (36.6 C) 98.4 F (36.9 C)  TempSrc: Oral  Oral Oral  SpO2: 100% (!) 80% 94% 99%  Weight:    112 kg  Height:       General: Pt is alert, awake, not in acute distress Cardiovascular: normal S1/S2 +, no rubs, no gallops Respiratory: CTA bilaterally, no wheezing, no rhonchi Abdominal: Soft, NT, ND, bowel sounds + Extremities: trace bilateral pretibial edema, no cyanosis   The results of significant diagnostics from this hospitalization (including imaging, microbiology, ancillary and laboratory) are listed below for reference.     Microbiology: Recent Results (from the past 240 hour(s))  Resp Panel by RT-PCR (Flu A&B, Covid) Anterior Nasal Swab     Status: None   Collection Time: 11/19/22  2:12 PM   Specimen: Anterior Nasal Swab  Result Value Ref Range Status   SARS Coronavirus 2 by RT PCR NEGATIVE NEGATIVE Final    Comment: (NOTE) SARS-CoV-2 target nucleic acids are NOT DETECTED.  The SARS-CoV-2 RNA is generally detectable in upper respiratory specimens during the acute phase of infection. The  lowest concentration of SARS-CoV-2 viral copies this assay can detect is 138 copies/mL. A negative result does not preclude SARS-Cov-2 infection and should not be used as the sole basis for treatment or other patient management decisions. A negative result may occur with  improper specimen collection/handling, submission of specimen other than nasopharyngeal  swab, presence of viral mutation(s) within the areas targeted by this assay, and inadequate number of viral copies(<138 copies/mL). A negative result must be combined with clinical observations, patient history, and epidemiological information. The expected result is Negative.  Fact Sheet for Patients:  EntrepreneurPulse.com.au  Fact Sheet for Healthcare Providers:  IncredibleEmployment.be  This test is no t yet approved or cleared by the [place] FDA and  has been authorized for detection and/or diagnosis of SARS-CoV-2 by FDA under an Emergency Use Authorization (EUA). This EUA will remain  in effect (meaning this test can be used) for the duration of the COVID-19 declaration under Section 564(b)(1) of the Act, 21 U.S.C.section 360bbb-3(b)(1), unless the authorization is terminated  or revoked sooner.       Influenza A by PCR NEGATIVE NEGATIVE Final   Influenza B by PCR NEGATIVE NEGATIVE Final    Comment: (NOTE) The Xpert Xpress SARS-CoV-2/FLU/RSV plus assay is intended as an aid in the diagnosis of influenza from Nasopharyngeal swab specimens and should not be used as a sole basis for treatment. Nasal washings and aspirates are unacceptable for Xpert Xpress SARS-CoV-2/FLU/RSV testing.  Fact Sheet for Patients: EntrepreneurPulse.com.au  Fact Sheet for Healthcare Providers: IncredibleEmployment.be  This test is not yet approved or cleared by the [place] FDA and has been authorized for detection and/or diagnosis of SARS-CoV-2 by FDA under  an Emergency Use Authorization (EUA). This EUA will remain in effect (meaning this test can be used) for the duration of the COVID-19 declaration under Section 564(b)(1) of the Act, 21 U.S.C. section 360bbb-3(b)(1), unless the authorization is terminated or revoked.  Performed at [facility], [address], [place], [place] [address]      Labs: BNP (last 3 results) Recent Labs    11/19/22 1428  BNP 56.2   Basic Metabolic Panel: Recent Labs  Lab 11/19/22 1428 11/20/22 0256 11/21/22 0250 11/22/22 0307 11/23/22 0250  NA 139 141 136 133* 133*  K 4.4 4.3 5.3* 5.7* 4.2  CL 105 108 100 96* 95*  CO2 _0 GLUCOSE 214* 116* 241* 204* 161*  BUN 35* 39* 44* 53* 56*  CREATININE 2.28* 2.81* 2.42* 2.41* 2.34*  CALCIUM 9.0 9.0 9.2 9.3 9.0  MG 2.2  --   --  2.4 2.3  PHOS  --   --  1.9*  --   --    Liver Function Tests: Recent Labs  Lab 11/19/22 1428 11/21/22 0250  AST 23  --   ALT 20  --   ALKPHOS 40  --   BILITOT 0.5  --   PROT 7.1  --   ALBUMIN 3.4* 3.3*   No results for input(s): "LIPASE", "AMYLASE" in the last 168 hours. No results for input(s): "AMMONIA" in the last 168 hours. CBC: Recent Labs  Lab 11/19/22 1428 11/21/22 0250  WBC 11.5* 13.5*  HGB 12.5 11.9*  HCT 39.2 35.7*  MCV 100.5* 97.8  PLT 249 250   Cardiac Enzymes: No results for input(s): "CKTOTAL", "CKMB", "CKMBINDEX", "TROPONINI" in the last 168 hours. BNP: Invalid input(s): "POCBNP" CBG: Recent Labs  Lab 11/22/22 1626 11/22/22 2219 11/23/22 0310 11/23/22 0721 11/23/22 1136  GLUCAP 229* 190* 173* 152* 80   D-Dimer No results for input(s): "DDIMER" in the last 72 hours. Hgb A1c No results for input(s): "HGBA1C" in the last 72 hours. Lipid Profile No results for input(s): "CHOL", "HDL", "LDLCALC", "TRIG", "CHOLHDL", "LDLDIRECT" in the last 72 hours. Thyroid function studies No results for input(s): "  TSH", "T4TOTAL", "T3FREE", "THYROIDAB" in the last 72 hours.  Invalid  input(s): "FREET3" Anemia work up No results for input(s): "VITAMINB12", "FOLATE", "FERRITIN", "TIBC", "IRON", "RETICCTPCT" in the last 72 hours. Urinalysis    Component Value Date/Time   COLORURINE AMBER (A) 06/03/2020 1451   APPEARANCEUR Clear 06/07/2022 1149   LABSPEC 1.023 06/03/2020 1451   PHURINE 7.0 06/03/2020 1451   GLUCOSEU Negative 06/07/2022 1149   HGBUR SMALL (A) 06/03/2020 1451   BILIRUBINUR Negative 06/07/2022 1149   KETONESUR NEGATIVE 06/03/2020 1451   PROTEINUR Negative 06/07/2022 1149   PROTEINUR >=300 (A) 06/03/2020 1451   UROBILINOGEN 0.2 06/03/2020 1150   UROBILINOGEN 0.2 09/09/2013 1911   NITRITE Negative 06/07/2022 1149   NITRITE NEGATIVE 06/03/2020 1451   LEUKOCYTESUR Negative 06/07/2022 1149   LEUKOCYTESUR TRACE (A) 06/03/2020 1451   Sepsis Labs Recent Labs  Lab 11/19/22 1428 11/21/22 0250  WBC 11.5* 13.5*   Microbiology Recent Results (from the past 240 hour(s))  Resp Panel by RT-PCR (Flu A&B, Covid) Anterior Nasal Swab     Status: None   Collection Time: 11/19/22  2:12 PM   Specimen: Anterior Nasal Swab  Result Value Ref Range Status   SARS Coronavirus 2 by RT PCR NEGATIVE NEGATIVE Final    Comment: (NOTE) SARS-CoV-2 target nucleic acids are NOT DETECTED.  The SARS-CoV-2 RNA is generally detectable in upper respiratory specimens during the acute phase of infection. The lowest concentration of SARS-CoV-2 viral copies this assay can detect is 138 copies/mL. A negative result does not preclude SARS-Cov-2 infection and should not be used as the sole basis for treatment or other patient management decisions. A negative result may occur with  improper specimen collection/handling, submission of specimen other than nasopharyngeal swab, presence of viral mutation(s) within the areas targeted by this assay, and inadequate number of viral copies(<138 copies/mL). A negative result must be combined with clinical observations, patient history, and  epidemiological information. The expected result is Negative.  Fact Sheet for Patients:  EntrepreneurPulse.com.au  Fact Sheet for Healthcare Providers:  IncredibleEmployment.be  This test is no t yet approved or cleared by the [place] FDA and  has been authorized for detection and/or diagnosis of SARS-CoV-2 by FDA under an Emergency Use Authorization (EUA). This EUA will remain  in effect (meaning this test can be used) for the duration of the COVID-19 declaration under Section 564(b)(1) of the Act, 21 U.S.C.section 360bbb-3(b)(1), unless the authorization is terminated  or revoked sooner.       Influenza A by PCR NEGATIVE NEGATIVE Final   Influenza B by PCR NEGATIVE NEGATIVE Final    Comment: (NOTE) The Xpert Xpress SARS-CoV-2/FLU/RSV plus assay is intended as an aid in the diagnosis of influenza from Nasopharyngeal swab specimens and should not be used as a sole basis for treatment. Nasal washings and aspirates are unacceptable for Xpert Xpress SARS-CoV-2/FLU/RSV testing.  Fact Sheet for Patients: EntrepreneurPulse.com.au  Fact Sheet for Healthcare Providers: IncredibleEmployment.be  This test is not yet approved or cleared by the [place] FDA and has been authorized for detection and/or diagnosis of SARS-CoV-2 by FDA under an Emergency Use Authorization (EUA). This EUA will remain in effect (meaning this test can be used) for the duration of the COVID-19 declaration under Section 564(b)(1) of the Act, 21 U.S.C. section 360bbb-3(b)(1), unless the authorization is terminated or revoked.  Performed at [facility], [address] [place], [place] [address]    Time coordinating discharge: 44 mins   SIGNED:  Irwin Brakeman, MD  [facility] 11/23/2022, 1:19 PM How to contact the [facility] Attending or Consulting provider [place] or covering provider during after hours [place], for this  patient?  Check the care team in [facility] and look for a) attending/consulting [facility] provider listed and b) the [facility] team listed Log into [email or web address] and use [facility]'s universal password to access. If you do not have the password, please contact the hospital operator. Locate the [facility] provider you are looking for under [facility] and page to a number that you can be directly reached. If you still have difficulty reaching the provider, please page the [facility] (Director on Call) for the Hospitalists listed on amion for assistance.

## 2022-11-23 NOTE — Progress Notes (Signed)
Patient is alert and oriented. Patient had gotten up to the bedside commode and to the bathroom during the night independently. Patient has been given 2 prn medications during this shift. Most recent blood sugar is 173 which required no insulin coverage. Patient wore her CPAP through the night and tolerated it well. Patient is currently on 2L of oxygen via nasal cannula.

## 2022-11-23 NOTE — Progress Notes (Signed)
SATURATION QUALIFICATIONS: (This note is used to comply with regulatory documentation for home oxygen)  Patient Saturations on Room Air at Rest = 98%  Patient Saturations on Room Air while Ambulating = 90%  Patient Saturations on 2 Liters of oxygen while Ambulating =98 %

## 2022-11-23 NOTE — Care Management Important Message (Signed)
Important Message  Patient Details  Name: Sara Tran MRN: [id number] Date of Birth: July 22, 1956   Medicare Important Message Given:  Yes     [name] 11/23/2022, 12:04 PM

## 2022-11-23 NOTE — Discharge Instructions (Signed)

## 2022-12-03 ENCOUNTER — Telehealth (HOSPITAL_COMMUNITY): Payer: Self-pay | Admitting: Surgery

## 2022-12-03 NOTE — Telephone Encounter (Signed)
I attempted to reach patient to schedule for anew patient appt.  I left a message for a return call.

## 2022-12-05 DIAGNOSIS — Z9884 Bariatric surgery status: Secondary | ICD-10-CM | POA: Diagnosis not present

## 2022-12-05 DIAGNOSIS — Z6841 Body Mass Index (BMI) 40.0 and over, adult: Secondary | ICD-10-CM | POA: Diagnosis not present

## 2022-12-05 DIAGNOSIS — N183 Chronic kidney disease, stage 3 unspecified: Secondary | ICD-10-CM | POA: Diagnosis not present

## 2022-12-05 DIAGNOSIS — I509 Heart failure, unspecified: Secondary | ICD-10-CM | POA: Diagnosis not present

## 2022-12-06 ENCOUNTER — Telehealth: Payer: Self-pay | Admitting: *Deleted

## 2022-12-06 ENCOUNTER — Telehealth: Payer: Self-pay

## 2022-12-06 NOTE — Progress Notes (Signed)
  Care Coordination Note  12/06/2022 Name: Sara Tran MRN: [id number] DOB: 1956-10-27  Sara Tran is a 66 y.o. year old female who is a primary care patient of Sharilyn Sites, MD and is actively engaged with the care management team. I reached out to [name] by phone today to assist with re-scheduling a follow up visit with the RN Case Manager  Follow up plan: Unsuccessful telephone outreach attempt made. A HIPAA compliant phone message was left for the patient providing contact information and requesting a return call.   [place]  Direct Dial: [phone]

## 2022-12-06 NOTE — Telephone Encounter (Signed)
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

## 2022-12-10 NOTE — Progress Notes (Signed)
ADVANCED HF CLINIC NOTE   Patient ID: Sara Tran, female   DOB: 04-30-1956, 66 y.o.   MRN: [id number] Primary Care: Dr. Phillips Odor Primary Cardiologist: Dr. Gala Romney  Nephrologist: Dr. Fausto Skillern  HPI: Sara Tran is a 66 y.o.  female with history of mild pulmonary hypertension, diabetes, HF, COPD, sleep apnea with CPAP, elevated triglycerides, renal insufficiency and morbid obesity s/p lap band in 2011 and CP with normal coronaries by cath in 2013 and 3/21  Echo 10/2017: EF 55-60% RV normal   In 3/21 was complaining of chest pain and concerned about worsening PAH and/or ischemic heart disease.  Had a repeat R/LHC with normal coronaries and stable mild PAH.   Admitted 11/23 with a/c CHF and a/c COPD. Given IV lasix and IV steroids. GDMT limited by kyperkalemia and need for [facility]. Echo showed EF 60-65%, moderate LVH, grade I DD, RV-RA gradient 70 mmHg consistent with severe pulm htn. Discharged home, weight 245 lbs  Today she returns for post hospital HF follow up. Overall feeling fair. Worries about her pulmonary hypertension, asking to get on sildenafil and stop Imdur. She has SOB walking short distances and with ADLs. Chest feels heavy. Has foot swelling occasionally. She is chronically dizzy. Denies abnormal bleeding or PND/Orthopnea. Appetite ok. No fever or chills. Weight at home 250 pounds. Taking all medications. Wears CPAP at night. Had frequent yeast infections. BP at home ~140s/90s. Does not work.  Cardiac studies:   - Echo (11/23): EF 60-65%, moderate LVH, RV-RA gradient 70 mmHg, RV systolic function OK  - L/RHC (3/21):  Ao = 178/79 (119) LV  = 168/16 RA = 9   RV = 41/11 PA = 42/17 (29) PCW = 13 Fick cardiac output/index = 5.3/2.4 PVR = 3.0 WU FA sat = 99% PA sat = 69%, 70%   - LHC (05/2012): normal cors  - Dr. Juanetta Tran performed PFTs on 03/13/12 which were normal. DLCO is minimally reduced, but is corrected to some extent when considerations of volume are made.  FVC  1.91 (81%), FEV1 (88%), FEV1/FVC 87 (107%), FEF 25-75% 2.09 (103%), DLCO 75%  - VQ scan (2008): normal   - RHC (2008):  RA pressures 13/10 with a mean of 7 mmHg.  RV pressure 57/12 with end diastolic   Review of systems complete and found to be negative unless listed in HPI.    Past Medical History:  Diagnosis Date   Adnexal cyst    Right simple cyst seen on [place]   Anginal pain (HCC)    Anxiety    Arthritis    Asthma    Atrial fibrillation (HCC)    hx of    CHF (congestive heart failure) (HCC)    Chronic back pain    Chronic hip pain    COPD (chronic obstructive pulmonary disease) (HCC)    Coronary artery disease    stents placed    Dyspnea    GERD (gastroesophageal reflux disease)    Headache    due to pinched nerve damaage    Heart murmur    hx of slight murmur    History of bronchitis    History of cardiac catheterization    Normal coronaries 2013   History of kidney stones    Hot flashes    Hypertension    Hypertriglyceridemia    IBS (irritable bowel syndrome)    Internal hemorrhoids    Kidney disease    Morbid obesity (HCC)    s/p lap band surgery   Myocardial  infarction (HCC) 12/2018   times 3   Obesity    Pulmonary HTN (HCC)    RHC 2008:  RA pressures 13/10 with a mean of 7 mmHg.  RV pressure 57/12 with end diastolic pressure of 15 mmHg.  PA pressure 49/23 with a mean of 35 mmHg.  Pulmonary capillary wedge 17/50 with a mean of 14 mmHg. The PA saturation was 68%.  RA saturation was 71% and aortic saturation was 90%.  Cardiac output was 6.0 with a cardiac index of 2.80 by Fick.  Normal coronaries by cath 2008.   Renal insufficiency    Sciatica of right side    Sleep apnea    CPAP use does not know settings    Trauma    Type 2 diabetes mellitus (HCC)    type II    Uterine fibroid    Current Outpatient Medications  Medication Sig Dispense Refill   albuterol (PROVENTIL) (2.5 MG/3ML) 0.083% nebulizer solution Take 2.5 mg by nebulization every 4 (four) hours as  needed for wheezing or shortness of breath.      albuterol (VENTOLIN HFA) 108 (90 Base) MCG/ACT inhaler Inhale 2 puffs into the lungs every 6 (six) hours as needed for wheezing or shortness of breath. 18 g 0   ALPRAZolam (XANAX) 0.5 MG tablet Take 0.5 mg by mouth daily as needed for anxiety.     amLODipine (NORVASC) 10 MG tablet Take 10 mg by mouth every evening.      Blood Glucose Calibration (TRUE METRIX LEVEL 2) Normal SOLN      Blood Glucose Monitoring Suppl (TRUE METRIX AIR GLUCOSE METER) w/Device KIT      Cholecalciferol (VITAMIN D3) 1.25 MG (50000 UT) TABS Patient takes 1 tablet by mouth once a week.     clobetasol (TEMOVATE) 0.05 % external solution Apply 1 application topically daily as needed (scalp irritation).      EPINEPHrine 0.3 mg/0.3 mL IJ SOAJ injection Inject 0.3 mg into the muscle as needed for anaphylaxis.     fenofibrate 160 MG tablet Take 160 mg by mouth daily.     fluticasone (FLONASE) 50 MCG/ACT nasal spray Place 1 spray into both nostrils daily.      furosemide (LASIX) 40 MG tablet Take 40 mg by mouth daily.     gabapentin (NEURONTIN) 100 MG capsule Take 2 capsules (200 mg total) by mouth 4 (four) times daily. 240 capsule 1   guaiFENesin-dextromethorphan (ROBITUSSIN DM) 100-10 MG/5ML syrup Take 5 mLs by mouth every 4 (four) hours as needed for cough. 118 mL 0   isosorbide mononitrate (IMDUR) 30 MG 24 hr tablet Take 30 mg by mouth daily.      levocetirizine (XYZAL) 5 MG tablet Take 5 mg by mouth at bedtime.     linaclotide (LINZESS) 72 MCG capsule Take 1 capsule (72 mcg total) by mouth daily before breakfast. 90 capsule 3   losartan-hydrochlorothiazide (HYZAAR) 100-25 MG tablet Take 1 tablet by mouth daily.     Multiple Vitamin (MULTIVITAMIN WITH MINERALS) TABS tablet Take 1 tablet by mouth daily.     ondansetron (ZOFRAN) 4 MG tablet Take 4 mg by mouth every 8 (eight) hours as needed for nausea or vomiting.      oxybutynin (DITROPAN-XL) 10 MG 24 hr tablet Take 1 tablet (10  mg total) by mouth daily as needed.     Oxycodone HCl 10 MG TABS Take 10 mg by mouth every 6 (six) hours.     potassium chloride SA (K-DUR) 20 MEQ tablet  Take 20 mEq by mouth daily.      pravastatin (PRAVACHOL) 40 MG tablet Take 40 mg by mouth daily.     predniSONE (DELTASONE) 10 MG tablet Take 1 tablet (10 mg total) by mouth daily with breakfast. 30 tablet 0   RESTASIS 0.05 % ophthalmic emulsion 1 drop 2 (two) times daily.     SYMBICORT 160-4.5 MCG/ACT inhaler Inhale 2 puffs into the lungs 2 (two) times daily. 30.6 g 3   TRUE METRIX BLOOD GLUCOSE TEST test strip      TRUEplus Lancets 33G MISC      TRULICITY 1.5 MG/0.5ML SOPN Inject 1.5 mg into the skin every 7 (seven) days.     vitamin B-12 (CYANOCOBALAMIN) 1000 MCG tablet Take 1,000 mcg by mouth daily.     Ascorbic Acid (VITAMIN C) 500 MG CAPS Take 500 mg by mouth daily.  (Patient not taking: Reported on 12/14/2022)     nitroGLYCERIN (NITROSTAT) 0.4 MG SL tablet Place 0.4 mg under the tongue every 5 (five) minutes as needed for chest pain. (Patient not taking: Reported on 12/14/2022)     No current facility-administered medications for this encounter.   Allergies  Allergen Reactions   Ancef [Cefazolin] Anaphylaxis and Shortness Of Breath        Bee Venom Anaphylaxis    As a child   Meloxicam     Gi upset    Ceftin [Cefuroxime Axetil] Swelling    Swollen tongue   Chicken Meat (Diagnostic) Nausea And Vomiting   Ciprofloxacin Cough    Coughed up blood   Macrodantin [Nitrofurantoin] Other (See Comments)    Vomited blood and had dark stool.   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 [place]  Tobacco Use   Smoking status: Former    Types: Cigarettes    Quit date: 08/08/1996    Years since quitting: 26.3   Smokeless tobacco: Never  Vaping Use   Vaping Use: Never used  Substance and Sexual Activity   Alcohol use: No   Drug use: No   Sexual activity: Not Currently    Birth control/protection: Post-menopausal  Other Topics Concern   Not on file  Social History Narrative   Not on file   Social Determinants of Health   Financial Resource Strain: Medium Risk (10/09/2022)   Overall Financial Resource Strain (CARDIA)    Difficulty of Paying Living Expenses: Somewhat hard  Food Insecurity: Food Insecurity Present (10/09/2022)   Hunger Vital Sign    Worried About Running Out of Food in the Last Year: Sometimes true    Ran Out of Food in the Last Year: Sometimes true  Transportation Needs: Unmet Transportation Needs (10/09/2022)   PRAPARE - Transportation    Lack of Transportation (Medical): Yes    Lack of Transportation (Non-Medical): Yes  Physical Activity: Insufficiently Active (10/09/2022)   Exercise Vital Sign  Days of Exercise per Week: 2 days    Minutes of Exercise per Session: 10 min  Stress: Stress Concern Present (10/09/2022)   [employer] of Occupational Health - Occupational Stress Questionnaire    Feeling of Stress : To some extent  Social Connections: Moderately Isolated (10/09/2022)   Social Connection and Isolation Panel [NHANES]    Frequency of Communication with Friends and Family: Once a week    Frequency of Social Gatherings with Friends and Family: Once a week    Attends Religious Services: More than 4 times per year    Active Member of [employer] or Organizations: Yes    Attends Banker Meetings: 1 to 4 times per year    Marital Status: Never married   Family  History  Problem Relation Age of Onset   Cancer Paternal Grandfather    Hypertension Paternal Grandmother    Stroke Paternal Grandmother    Other Maternal Grandmother        tonsilitis   Diabetes Maternal Grandfather    Hypertension Maternal Grandfather    CAD Father    Hypertension Father    Heart failure Father    Kidney disease Father    Diabetes Mother    Heart failure Mother    Breast cancer Sister    Breast cancer Paternal Aunt    Wt Readings from Last 3 Encounters:  12/14/22 113.9 kg (251 lb)  11/23/22 112 kg (246 lb 14.4 oz)  10/09/22 111.8 kg (246 lb 8 oz)   BP (!) 146/87   Pulse 100   Wt 113.9 kg (251 lb)   LMP 06/06/2013   SpO2 97%   BMI 49.02 kg/m   PHYSICAL EXAM: General:  NAD. No resp difficulty, walked into clinic HEENT: Normal Neck: Supple. No JVD, thick neck. Carotids 2+ bilat; no bruits. No lymphadenopathy or thryomegaly appreciated. Cor: PMI nondisplaced. Regular rate & rhythm. No rubs, gallops or murmurs. Lungs: Diminished in bases Abdomen: Obese, soft, nontender, nondistended. No hepatosplenomegaly. No bruits or masses. Good bowel sounds. Extremities: No cyanosis, clubbing, rash, pedal edema Neuro: Alert & oriented x 3, cranial nerves grossly intact. Moves all 4 extremities w/o difficulty. Affect pleasant.  ECG (personally reviewed): NSR 96 bpm, left axis deviation  ASSESSMENT & PLAN: 1. Pulmonary hypertension:  - Patient had mild pulmonary hypertension on RHC in 6/13.  - Echo (1/16) with mild RVH and Grade 1 DD, otherwise unremarkable.  - Cath 3/21 with mild PAH. Suggest weight loss. 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 - Reviewed echo images with Dr. Gasper Lloyd, with increase in RV-RA gradient and concern for worsening pulmonary hypertension, we discussed RHC to further assess hemodynamics. She is agreeable. Will  arrange with Dr. Gala Romney. - Continue current dose of Lasix - Hold off on SGLT2i with frequent yeast infections and large pannus.  2. Morbid obesity - Body mass index is 49.02 kg/m.  - Continue weight loss efforts.  - She is taking GLP1RA  3. History of CP with dyspnea - Cath 2013 without CAD - Cath 3/21 without CAD, mild Pulm HTN - No recent SL nitro use - Continue Imdur  4. OSA - Continue CPAP - She followed with Dr. Vassie Loll  5. Hypertension - Elevated. - Add hydralazine 25 mg tid for afterload reduction.  6. DM2 - Per PCP.   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  Follow up in 2 weeks after RHC with APP.  Prince Rome, FNP-BC 12/14/22

## 2022-12-10 NOTE — H&P (View-Only) (Signed)
ADVANCED HF CLINIC NOTE   Patient ID: Sara Tran, female   DOB: [birth year], 66 y.o.   MRN: [id number] Primary Care: Dr. Hilma Favors Primary Cardiologist: Dr. Haroldine Laws  Nephrologist: Dr. Hinda Lenis  HPI: Sara Tran is a 66 y.o.  female with history of mild pulmonary hypertension, diabetes, HF, COPD, sleep apnea with CPAP, elevated triglycerides, renal insufficiency and morbid obesity s/p lap band in 2011 and CP with normal coronaries by cath in 2013 and 3/21  Echo 10/2017: EF 55-60% RV normal   In 3/21 was complaining of chest pain and concerned about worsening PAH and/or ischemic heart disease.  Had a repeat R/LHC with normal coronaries and stable mild PAH.   Admitted 11/23 with a/c CHF and a/c COPD. Given IV lasix and IV steroids. GDMT limited by kyperkalemia and need for [facility]. Echo showed EF 60-65%, moderate LVH, grade I DD, RV-RA gradient 70 mmHg consistent with severe pulm htn. Discharged home, weight 245 lbs  Today she returns for post hospital HF follow up. Overall feeling fair. Worries about her pulmonary hypertension, asking to get on sildenafil and stop Imdur. She has SOB walking short distances and with ADLs. Chest feels heavy. Has foot swelling occasionally. She is chronically dizzy. Denies abnormal bleeding or PND/Orthopnea. Appetite ok. No fever or chills. Weight at home 250 pounds. Taking all medications. Wears CPAP at night. Had frequent yeast infections. BP at home ~140s/90s. Does not work.  Cardiac studies:   - Echo (11/23): EF 60-65%, moderate LVH, RV-RA gradient 70 mmHg, RV systolic function OK  - L/RHC (3/21):  Ao = 178/79 (119) LV  = 168/16 RA = 9   RV = 41/11 PA = 42/17 (29) PCW = 13 Fick cardiac output/index = 5.3/2.4 PVR = 3.0 WU FA sat = 99% PA sat = 69%, 70%   - LHC (05/2012): normal cors  - Dr. Luan Tran performed PFTs on 03/13/12 which were normal. DLCO is minimally reduced, but is corrected to some extent when considerations of volume are made.  FVC  1.91 (81%), FEV1 (88%), FEV1/FVC 87 (107%), FEF 25-75% 2.09 (103%), DLCO 75%  - VQ scan (2008): normal   - RHC (2008):  RA pressures 13/10 with a mean of 7 mmHg.  RV pressure 03/49 with end diastolic   Review of systems complete and found to be negative unless listed in HPI.    Past Medical History:  Diagnosis Date   Adnexal cyst    Right simple cyst seen on [place]   Anginal pain (HCC)    Anxiety    Arthritis    Asthma    Atrial fibrillation (HCC)    hx of    CHF (congestive heart failure) (HCC)    Chronic back pain    Chronic hip pain    COPD (chronic obstructive pulmonary disease) ([place])    Coronary artery disease    stents placed    Dyspnea    GERD (gastroesophageal reflux disease)    Headache    due to pinched nerve damaage    Heart murmur    hx of slight murmur    History of bronchitis    History of cardiac catheterization    Normal coronaries 2013   History of kidney stones    Hot flashes    Hypertension    Hypertriglyceridemia    IBS (irritable bowel syndrome)    Internal hemorrhoids    Kidney disease    Morbid obesity ([place])    s/p lap band surgery   Myocardial  infarction ([place]) 12/2018   times 3   Obesity    Pulmonary HTN ([place])    [place] 2008:  RA pressures 13/10 with a mean of 7 mmHg.  RV pressure 33/00 with end diastolic pressure of 15 mmHg.  PA pressure 49/23 with a mean of 35 mmHg.  Pulmonary capillary wedge 17/50 with a mean of 14 mmHg. The PA saturation was 68%.  RA saturation was 71% and aortic saturation was 90%.  Cardiac output was 6.0 with a cardiac index of 2.80 by Fick.  Normal coronaries by cath 2008.   Renal insufficiency    Sciatica of right side    Sleep apnea    CPAP use does not know settings    Trauma    Type 2 diabetes mellitus (HCC)    type II    Uterine fibroid    Current Outpatient Medications  Medication Sig Dispense Refill   albuterol (PROVENTIL) (2.5 MG/3ML) 0.083% nebulizer solution Take 2.5 mg by nebulization every 4 (four) hours as  needed for wheezing or shortness of breath.      albuterol (VENTOLIN HFA) 108 (90 Base) MCG/ACT inhaler Inhale 2 puffs into the lungs every 6 (six) hours as needed for wheezing or shortness of breath. 18 g 0   ALPRAZolam (XANAX) 0.5 MG tablet Take 0.5 mg by mouth daily as needed for anxiety.     amLODipine (NORVASC) 10 MG tablet Take 10 mg by mouth every evening.      Blood Glucose Calibration (TRUE METRIX LEVEL 2) Normal SOLN      Blood Glucose Monitoring Suppl (TRUE METRIX AIR GLUCOSE METER) w/Device KIT      Cholecalciferol (VITAMIN D3) 1.25 MG (50000 UT) TABS Patient takes 1 tablet by mouth once a week.     clobetasol (TEMOVATE) 0.05 % external solution Apply 1 application topically daily as needed (scalp irritation).      EPINEPHrine 0.3 mg/0.3 mL IJ SOAJ injection Inject 0.3 mg into the muscle as needed for anaphylaxis.     fenofibrate 160 MG tablet Take 160 mg by mouth daily.     fluticasone (FLONASE) 50 MCG/ACT nasal spray Place 1 spray into both nostrils daily.      furosemide (LASIX) 40 MG tablet Take 40 mg by mouth daily.     gabapentin (NEURONTIN) 100 MG capsule Take 2 capsules (200 mg total) by mouth 4 (four) times daily. 240 capsule 1   guaiFENesin-dextromethorphan (ROBITUSSIN DM) 100-10 MG/5ML syrup Take 5 mLs by mouth every 4 (four) hours as needed for cough. 118 mL 0   isosorbide mononitrate (IMDUR) 30 MG 24 hr tablet Take 30 mg by mouth daily.      levocetirizine (XYZAL) 5 MG tablet Take 5 mg by mouth at bedtime.     linaclotide (LINZESS) 72 MCG capsule Take 1 capsule (72 mcg total) by mouth daily before breakfast. 90 capsule 3   losartan-hydrochlorothiazide (HYZAAR) 100-25 MG tablet Take 1 tablet by mouth daily.     Multiple Vitamin (MULTIVITAMIN WITH MINERALS) TABS tablet Take 1 tablet by mouth daily.     ondansetron (ZOFRAN) 4 MG tablet Take 4 mg by mouth every 8 (eight) hours as needed for nausea or vomiting.      oxybutynin (DITROPAN-XL) 10 MG 24 hr tablet Take 1 tablet (10  mg total) by mouth daily as needed.     Oxycodone HCl 10 MG TABS Take 10 mg by mouth every 6 (six) hours.     potassium chloride SA (K-DUR) 20 MEQ tablet  Take 20 mEq by mouth daily.      pravastatin (PRAVACHOL) 40 MG tablet Take 40 mg by mouth daily.     predniSONE (DELTASONE) 10 MG tablet Take 1 tablet (10 mg total) by mouth daily with breakfast. 30 tablet 0   RESTASIS 0.05 % ophthalmic emulsion 1 drop 2 (two) times daily.     SYMBICORT 160-4.5 MCG/ACT inhaler Inhale 2 puffs into the lungs 2 (two) times daily. 30.6 g 3   TRUE METRIX BLOOD GLUCOSE TEST test strip      TRUEplus Lancets 40H MISC      TRULICITY 1.5 KV/4.2VZ SOPN Inject 1.5 mg into the skin every 7 (seven) days.     vitamin B-12 (CYANOCOBALAMIN) 1000 MCG tablet Take 1,000 mcg by mouth daily.     Ascorbic Acid (VITAMIN C) 500 MG CAPS Take 500 mg by mouth daily.  (Patient not taking: Reported on 12/14/2022)     nitroGLYCERIN (NITROSTAT) 0.4 MG SL tablet Place 0.4 mg under the tongue every 5 (five) minutes as needed for chest pain. (Patient not taking: Reported on 12/14/2022)     No current facility-administered medications for this encounter.   Allergies  Allergen Reactions   Ancef [Cefazolin] Anaphylaxis and Shortness Of Breath        Bee Venom Anaphylaxis    As a child   Meloxicam     Gi upset    Ceftin [Cefuroxime Axetil] Swelling    Swollen tongue   Chicken Meat (Diagnostic) Nausea And Vomiting   Ciprofloxacin Cough    Coughed up blood   Macrodantin [Nitrofurantoin] Other (See Comments)    Vomited blood and had dark stool.   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 [place] Use   Smoking status: Former    Types: Cigarettes    Quit date: 08/08/1996    Years since quitting: 26.3   Smokeless tobacco: Never  Vaping Use   Vaping Use: Never used  Substance and Sexual Activity   Alcohol use: No   Drug use: No   Sexual activity: Not Currently    Birth control/protection: Post-menopausal  Other Topics Concern   Not on file  Social History Narrative   Not on file   Social Determinants of Health   Financial Resource Strain: Medium Risk (10/09/2022)   Overall Financial Resource Strain (CARDIA)    Difficulty of Paying Living Expenses: Somewhat hard  Food Insecurity: Food Insecurity Present (10/09/2022)   Hunger Vital Sign    Worried About [place] in the Last Year: Sometimes true    Ran Out of Food in the Last Year: Sometimes true  Transportation Needs: Unmet Transportation Needs (10/09/2022)   PRAPARE - Transportation    Lack of Transportation (Medical): Yes    Lack of Transportation (Non-Medical): Yes  Physical Activity: Insufficiently Active (10/09/2022)   Exercise Vital Sign  Days of Exercise per Week: 2 days    Minutes of Exercise per Session: 10 min  Stress: Stress Concern Present (10/09/2022)   [place]    Feeling of Stress : To some extent  Social Connections: Moderately Isolated (10/09/2022)   Social Connection and Isolation Panel [NHANES]    Frequency of Communication with Friends and Family: Once a week    Frequency of Social Gatherings with Friends and Family: Once a week    Attends Religious Services: More than 4 times per year    Active Member of [employer] or Organizations: Yes    Attends Archivist Meetings: 1 to 4 times per year    Marital Status: Never married   Family  History  Problem Relation Age of Onset   Cancer Paternal Grandfather    Hypertension Paternal Grandmother    Stroke Paternal Grandmother    Other Maternal Grandmother        tonsilitis   Diabetes Maternal Grandfather    Hypertension Maternal Grandfather    CAD Father    Hypertension Father    Heart failure Father    Kidney disease Father    Diabetes Mother    Heart failure Mother    Breast cancer Sister    Breast cancer Paternal Aunt    Wt Readings from Last 3 Encounters:  12/14/22 113.9 kg (251 lb)  11/23/22 112 kg (246 lb 14.4 oz)  10/09/22 111.8 kg (246 lb 8 oz)   BP (!) 146/87   Pulse 100   Wt 113.9 kg (251 lb)   LMP 06/06/2013   SpO2 97%   BMI 49.02 kg/m   PHYSICAL EXAM: General:  NAD. No resp difficulty, walked into clinic HEENT: Normal Neck: Supple. No JVD, thick neck. Carotids 2+ bilat; no bruits. No lymphadenopathy or thryomegaly appreciated. Cor: PMI nondisplaced. Regular rate & rhythm. No rubs, gallops or murmurs. Lungs: Diminished in bases Abdomen: Obese, soft, nontender, nondistended. No hepatosplenomegaly. No bruits or masses. Good bowel sounds. Extremities: No cyanosis, clubbing, rash, pedal edema Neuro: Alert & oriented x 3, cranial nerves grossly intact. Moves all 4 extremities w/o difficulty. Affect pleasant.  ECG (personally reviewed): NSR 96 bpm, left axis deviation  ASSESSMENT & PLAN: 1. Pulmonary hypertension:  - Patient had mild pulmonary hypertension on RHC in 6/13.  - Echo (1/16) with mild RVH and Grade 1 DD, otherwise unremarkable.  - Cath 3/21 with mild PAH. Suggest weight loss. 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 - Reviewed echo images with Dr. Daniel Nones, with increase in RV-RA gradient and concern for worsening pulmonary hypertension, we discussed RHC to further assess hemodynamics. She is agreeable. Will  arrange with Dr. Haroldine Laws. - Continue current dose of Lasix - Hold off on SGLT2i with frequent yeast infections and large pannus.  2. Morbid obesity - Body mass index is 49.02 kg/m.  - Continue weight loss efforts.  - She is taking GLP1RA  3. History of CP with dyspnea - Cath 2013 without CAD - Cath 3/21 without CAD, mild Pulm HTN - No recent SL nitro use - Continue Imdur  4. OSA - Continue CPAP - She followed with Dr. Elsworth Soho  5. Hypertension - Elevated. - Add hydralazine 25 mg tid for afterload reduction.  6. DM2 - Per PCP.   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  Follow up in 2 weeks after RHC with APP. Discussed with Dr. Haroldine Laws.  Allena Katz, FNP-BC 12/14/22

## 2022-12-12 ENCOUNTER — Telehealth: Payer: Self-pay | Admitting: *Deleted

## 2022-12-12 MED ORDER — PREDNISONE 10 MG PO TABS: 10.0000 mg | ORAL_TABLET | Freq: Every day | ORAL | 0 refills | Status: DC

## 2022-12-12 NOTE — Telephone Encounter (Signed)
Called patient and she is asking for a refill on her prednisone. Looks like on AVS Dr Elsworth Soho said to use as needed. She states she feels like she is having some issues with her breathing and would like to catch it before it gets worse  Dr Elsworth Soho are you ok with me sending in refill   Please advise

## 2022-12-12 NOTE — Telephone Encounter (Signed)
Called patient back and she verified pharmacy with me over the phone. Nothing further needed

## 2022-12-13 NOTE — Progress Notes (Signed)
  Care Coordination Note  12/13/2022 Name: Sara Tran MRN: [id number] DOB: Jan 14, 1956  Sara Tran is a 66 y.o. year old female who is a primary care patient of Sharilyn Sites, MD and is actively engaged with the care management team. I reached out to [name] by phone today to assist with re-scheduling a follow up visit with the RN Case Manager  Follow up plan: Unsuccessful telephone outreach attempt made. A HIPAA compliant phone message was left for the patient providing contact information and requesting a return call.  We have been unable to make contact with the patient for follow up. The care management team is available to follow up with the patient after provider conversation with the patient regarding recommendation for care management engagement and subsequent re-referral to the care management team.  [place]  Direct Dial: [phone]

## 2022-12-14 ENCOUNTER — Encounter (HOSPITAL_COMMUNITY): Payer: Self-pay

## 2022-12-14 ENCOUNTER — Ambulatory Visit (HOSPITAL_COMMUNITY)
Admission: RE | Admit: 2022-12-14 | Discharge: 2022-12-14 | Disposition: A | Discharge status: Home / Self Care | Payer: Medicare HMO | Source: Ambulatory Visit | Attending: Family Medicine | Admitting: Family Medicine

## 2022-12-14 VITALS — BP 146/87 | HR 100 | Wt 251.0 lb

## 2022-12-14 DIAGNOSIS — I2729 Other secondary pulmonary hypertension: Secondary | ICD-10-CM

## 2022-12-14 DIAGNOSIS — I272 Pulmonary hypertension, unspecified: Secondary | ICD-10-CM | POA: Insufficient documentation

## 2022-12-14 DIAGNOSIS — R0789 Other chest pain: Secondary | ICD-10-CM

## 2022-12-14 DIAGNOSIS — Z6841 Body Mass Index (BMI) 40.0 and over, adult: Secondary | ICD-10-CM | POA: Diagnosis not present

## 2022-12-14 DIAGNOSIS — G478 Other sleep disorders: Secondary | ICD-10-CM

## 2022-12-14 DIAGNOSIS — Z79899 Other long term (current) drug therapy: Secondary | ICD-10-CM | POA: Insufficient documentation

## 2022-12-14 DIAGNOSIS — N184 Chronic kidney disease, stage 4 (severe): Secondary | ICD-10-CM | POA: Insufficient documentation

## 2022-12-14 DIAGNOSIS — N183 Chronic kidney disease, stage 3 unspecified: Secondary | ICD-10-CM

## 2022-12-14 DIAGNOSIS — E1122 Type 2 diabetes mellitus with diabetic chronic kidney disease: Secondary | ICD-10-CM | POA: Diagnosis not present

## 2022-12-14 DIAGNOSIS — I10 Essential (primary) hypertension: Secondary | ICD-10-CM

## 2022-12-14 DIAGNOSIS — Z794 Long term (current) use of insulin: Secondary | ICD-10-CM

## 2022-12-14 DIAGNOSIS — I130 Hypertensive heart and chronic kidney disease with heart failure and stage 1 through stage 4 chronic kidney disease, or unspecified chronic kidney disease: Secondary | ICD-10-CM | POA: Diagnosis not present

## 2022-12-14 DIAGNOSIS — G4733 Obstructive sleep apnea (adult) (pediatric): Secondary | ICD-10-CM | POA: Diagnosis not present

## 2022-12-14 LAB — BASIC METABOLIC PANEL
Anion gap: 9 (ref 5–15)
BUN: 30 mg/dL — ABNORMAL HIGH (ref 8–23)
CO2: 28 mmol/L (ref 22–32)
Calcium: 9.4 mg/dL (ref 8.9–10.3)
Chloride: 106 mmol/L (ref 98–111)
Creatinine, Ser: 2.54 mg/dL — ABNORMAL HIGH (ref 0.44–1.00)
GFR, Estimated: 20 mL/min — ABNORMAL LOW (ref >60)
Glucose, Bld: 167 mg/dL — ABNORMAL HIGH (ref 70–99)
Potassium: 4.2 mmol/L (ref 3.5–5.1)
Sodium: 143 mmol/L (ref 135–145)

## 2022-12-14 LAB — BRAIN NATRIURETIC PEPTIDE: B Natriuretic Peptide: 27.1 pg/mL (ref 0.0–100.0)

## 2022-12-14 LAB — CBC
HCT: 35.8 % — ABNORMAL LOW (ref 36.0–46.0)
Hemoglobin: 11.5 g/dL — ABNORMAL LOW (ref 12.0–15.0)
MCH: 32.9 pg (ref 26.0–34.0)
MCHC: 32.1 g/dL (ref 30.0–36.0)
MCV: 102.3 fL — ABNORMAL HIGH (ref 80.0–100.0)
Platelets: 264 10*3/uL (ref 150–400)
RBC: 3.5 MIL/uL — ABNORMAL LOW (ref 3.87–5.11)
RDW: 12.7 % (ref 11.5–15.5)
WBC: 9.3 10*3/uL (ref 4.0–10.5)
nRBC: 0 % (ref 0.0–0.2)

## 2022-12-14 MED ORDER — HYDRALAZINE HCL 25 MG PO TABS: 25.0000 mg | ORAL_TABLET | Freq: Three times a day (TID) | ORAL | 3 refills | Status: DC

## 2022-12-14 NOTE — Patient Instructions (Addendum)
START Hydralazine 25 mg one tab three times daily   Labs today We will only contact you if something comes back abnormal or we need to make some changes. Otherwise no news is good news!  Your physician recommends that you schedule a follow-up appointment in: 2-3 weeks  in the Advanced Practitioners (PA/NP) Clinic and 3 months with Dr Haroldine Laws      You are scheduled for a Cardiac Catheterization on Thursday, December 21 with Dr. Glori Bickers.  1. Please arrive at the [facility] (Main Entrance A) at [facility]: [address] [place], [place] [address] at 10:30 AM (This time is two hours before your procedure to ensure your preparation). Free valet parking service is available.   Special note: Every effort is made to have your procedure done on time. Please understand that emergencies sometimes delay scheduled procedures.  2. Diet: Do not eat solid foods after midnight.  The patient may have clear liquids until 5am upon the day of the procedure.  3. Labs: Pre procedure labs  4. Medication instructions in preparation for your procedure:   Contrast Allergy: No     Stop taking, Lasix (Furosemide)  Thursday, December 21,     On the morning of your procedure, take your Aspirin 81 mg and any morning medicines NOT listed above.  You may use sips of water.  5. Plan for one night stay--bring personal belongings. 6. Bring a current list of your medications and current insurance cards. 7. You MUST have a responsible person to drive you home. 8. Someone MUST be with you the first 24 hours after you arrive home or your discharge will be delayed. 9. Please wear clothes that are easy to get on and off and wear slip-on shoes.  Thank you for allowing [place] to care for you!   -- [facility]

## 2022-12-18 NOTE — Addendum Note (Signed)
Encounter addended by: Rafael Bihari, FNP on: 12/18/2022 1:01 PM  Actions taken: Clinical Note Signed

## 2022-12-19 ENCOUNTER — Other Ambulatory Visit (HOSPITAL_COMMUNITY)
Admission: RE | Admit: 2022-12-19 | Discharge: 2022-12-19 | Disposition: A | Discharge status: Home / Self Care | Payer: Medicare HMO | Source: Ambulatory Visit | Attending: Obstetrics & Gynecology | Admitting: Obstetrics & Gynecology

## 2022-12-19 ENCOUNTER — Encounter: Payer: Self-pay | Admitting: Obstetrics & Gynecology

## 2022-12-19 ENCOUNTER — Ambulatory Visit: Payer: Medicare HMO | Admitting: Obstetrics & Gynecology

## 2022-12-19 ENCOUNTER — Other Ambulatory Visit (HOSPITAL_COMMUNITY)
Admission: RE | Admit: 2022-12-19 | Discharge: 2022-12-19 | Disposition: A | Discharge status: Home / Self Care | Payer: Medicare HMO | Source: Ambulatory Visit | Admitting: Internal Medicine

## 2022-12-19 VITALS — BP 176/79 | HR 80 | Ht 60.0 in | Wt 248.0 lb

## 2022-12-19 DIAGNOSIS — N898 Other specified noninflammatory disorders of vagina: Secondary | ICD-10-CM

## 2022-12-19 DIAGNOSIS — E119 Type 2 diabetes mellitus without complications: Secondary | ICD-10-CM

## 2022-12-19 DIAGNOSIS — E781 Pure hyperglyceridemia: Secondary | ICD-10-CM | POA: Diagnosis not present

## 2022-12-19 DIAGNOSIS — N184 Chronic kidney disease, stage 4 (severe): Secondary | ICD-10-CM | POA: Diagnosis not present

## 2022-12-19 DIAGNOSIS — C541 Malignant neoplasm of endometrium: Secondary | ICD-10-CM | POA: Diagnosis not present

## 2022-12-19 DIAGNOSIS — I2721 Secondary pulmonary arterial hypertension: Secondary | ICD-10-CM | POA: Diagnosis not present

## 2022-12-19 DIAGNOSIS — N950 Postmenopausal bleeding: Secondary | ICD-10-CM | POA: Diagnosis not present

## 2022-12-19 DIAGNOSIS — Z6841 Body Mass Index (BMI) 40.0 and over, adult: Secondary | ICD-10-CM | POA: Diagnosis not present

## 2022-12-19 DIAGNOSIS — I10 Essential (primary) hypertension: Secondary | ICD-10-CM

## 2022-12-19 DIAGNOSIS — J449 Chronic obstructive pulmonary disease, unspecified: Secondary | ICD-10-CM | POA: Diagnosis not present

## 2022-12-19 DIAGNOSIS — I130 Hypertensive heart and chronic kidney disease with heart failure and stage 1 through stage 4 chronic kidney disease, or unspecified chronic kidney disease: Secondary | ICD-10-CM | POA: Diagnosis not present

## 2022-12-19 DIAGNOSIS — Z87891 Personal history of nicotine dependence: Secondary | ICD-10-CM | POA: Diagnosis not present

## 2022-12-19 DIAGNOSIS — I509 Heart failure, unspecified: Secondary | ICD-10-CM | POA: Diagnosis not present

## 2022-12-19 DIAGNOSIS — E1122 Type 2 diabetes mellitus with diabetic chronic kidney disease: Secondary | ICD-10-CM | POA: Diagnosis not present

## 2022-12-19 DIAGNOSIS — G4733 Obstructive sleep apnea (adult) (pediatric): Secondary | ICD-10-CM | POA: Diagnosis not present

## 2022-12-19 DIAGNOSIS — Z79899 Other long term (current) drug therapy: Secondary | ICD-10-CM | POA: Diagnosis not present

## 2022-12-19 DIAGNOSIS — Z9884 Bariatric surgery status: Secondary | ICD-10-CM | POA: Diagnosis not present

## 2022-12-19 DIAGNOSIS — N858 Other specified noninflammatory disorders of uterus: Secondary | ICD-10-CM | POA: Diagnosis not present

## 2022-12-19 DIAGNOSIS — L304 Erythema intertrigo: Secondary | ICD-10-CM | POA: Diagnosis not present

## 2022-12-19 MED ORDER — NYSTATIN 100000 UNIT/GM EX OINT: 1.0000 | TOPICAL_OINTMENT | Freq: Two times a day (BID) | CUTANEOUS | 2 refills | Status: AC

## 2022-12-19 NOTE — Progress Notes (Signed)
GYN VISIT Patient name: Sara Tran MRN [id number]  Date of birth: December 20, 1956 Chief Complaint:   Procedure (Endo bx. )  History of Present Illness:   Sara Tran is a 66 y.o. G3P0030 PM female being seen today for follow up regarding the following:  PMB: Notes "awful smell and discharge" that has been going on for several months.  The color varies from grey to red- on occasion will look like blood.  She states this discharge is coming from below her abdomen and extends down to her vagina.  Based on history it is unclear if she i  Also reports abdominal/pelvic pain: Rates her pain 9/10, taking pain medication for her back, which does help her abdominal pain.  Pain is both ache/stabbing and may radiate to her back.  Pain has been going on for the past year.  Notes constipation.  Some nausea, no vomiting.  Denies change in her appetite.  On occasion will note urge incontinence if she holds it too long.    Patient's last menstrual period was 06/06/2013.     10/09/2022    2:10 PM 09/26/2022    2:19 PM 06/26/2022    1:49 PM 05/29/2022    1:53 PM 05/01/2022    2:02 PM  Depression screen PHQ 2/9  Decreased Interest 1 1 0 0 1  Down, Depressed, Hopeless 0 1 0 0 1  PHQ - 2 Score 1 2 0 0 2  Altered sleeping 1      Tired, decreased energy 1      Change in appetite 1      Feeling bad or failure about yourself  1      Trouble concentrating 0      Moving slowly or fidgety/restless 0      Suicidal thoughts 0      PHQ-9 Score 5         Review of Systems:   Pertinent items are noted in HPI Denies fever/chills, dizziness, headaches, visual disturbances, fatigue, shortness of breath, chest pain, abdominal pain, vomiting.  Pertinent History Reviewed:  Reviewed past medical,surgical, social, obstetrical and family history.  Reviewed problem list, medications and allergies. Physical Assessment:   Vitals:   12/19/22 0938 12/19/22 1018  BP: (!) 188/71 (!) 176/79  Pulse: 80   Weight: 248 lb  (112.5 kg)   Height: 5' (1.524 m)   Body mass index is 48.43 kg/m.       Physical Examination:   General appearance: alert, well appearing, and in no distress  Psych: mood appropriate, normal affect  Skin: warm & dry   Cardiovascular: normal heart rate noted  Respiratory: normal respiratory effort, no distress  Abdomen: morbidly obese with large pannus, soft and non-tender, no rebound, no guarding, no reproducible pain.  Hyperpigmentation with odor noted underneath pannus  Pelvic: VULVA: normal appearing vulva with no masses, tenderness or lesions, VAGINA: normal appearing vagina with normal color and discharge, no lesions, CERVIX: normal appearing cervix without discharge or lesions, UTERUS: uterus is normal size, shape, consistency and nontender- exam limited due to body habitus  Extremities: 1+ edema, no calf tenderness bilaterally  Chaperone: Celene Squibb    Endometrial Biopsy Procedure Note  Pre-operative Diagnosis: PMB  Post-operative Diagnosis: same  Procedure Details  The risks (including infection, bleeding, pain, and uterine perforation) and benefits of the procedure were explained to the patient and Written informed consent was obtained.  Antibiotic prophylaxis against endocarditis was not indicated.   The patient was placed in the  dorsal lithotomy position.  Bimanual exam showed the uterus to be in the neutral position.  A speculum inserted in the vagina, and the cervix prepped with betadine.     A single tooth tenaculum was applied to the anterior lip of the cervix for stabilization.  Due ot cervical stenosis small dilator and os finder were used to enter into the uterine cavity.  A P[place]elle endometrial aspirator was used to sample the endometrium.  Sample was sent for pathologic examination.  Condition: Stable  Complications: None   Assessment & Plan:  1) PMB -reviewed pelvic US, difficulty with visualization of endometrium, advised EMB -inform consent obtained,  see above procedure -Next step pending results of pathology. The patient was advised to call for any fever or for prolonged or severe pain or bleeding. She was advised to use OTC analgesics as needed for mild to moderate pain. She was advised to avoid vaginal intercourse for 48 hours or until the bleeding has completely stopped.  2) Intertrigo -discussed complicating factors including body habitus and DM.  Encouraged pt to work on sugar management -encouraged pt to work on keeping affected area- clean/dry -topical treatment sent in -vaginitis panel also obtained, further management pending results  3) Essential HTN -encouraged pt to take medication regularly  4) DM -advised f/u with PCP regarding DM management  Meds ordered this encounter  Medications   nystatin ointment (MYCOSTATIN)    Sig: Apply 1 Application topically 2 (two) times daily for 14 days.    Dispense:  30 g    Refill:  2      Return in about 1 year (around 12/20/2023) for Annual.   Janyth Pupa, DO Attending [place], [place] for [facility], [place]

## 2022-12-20 ENCOUNTER — Other Ambulatory Visit: Payer: Self-pay

## 2022-12-20 ENCOUNTER — Encounter (HOSPITAL_COMMUNITY)
Admission: RE | Disposition: A | Discharge status: Home / Self Care | Payer: Self-pay | Source: Ambulatory Visit | Attending: Internal Medicine

## 2022-12-20 ENCOUNTER — Ambulatory Visit (HOSPITAL_COMMUNITY)
Admission: RE | Admit: 2022-12-20 | Discharge: 2022-12-20 | Disposition: A | Discharge status: Home / Self Care | Payer: Medicare HMO | Source: Ambulatory Visit | Attending: Internal Medicine | Admitting: Internal Medicine

## 2022-12-20 ENCOUNTER — Other Ambulatory Visit: Payer: Self-pay | Admitting: Physical Medicine & Rehabilitation

## 2022-12-20 DIAGNOSIS — Z9884 Bariatric surgery status: Secondary | ICD-10-CM | POA: Insufficient documentation

## 2022-12-20 DIAGNOSIS — E781 Pure hyperglyceridemia: Secondary | ICD-10-CM | POA: Insufficient documentation

## 2022-12-20 DIAGNOSIS — I2721 Secondary pulmonary arterial hypertension: Secondary | ICD-10-CM | POA: Insufficient documentation

## 2022-12-20 DIAGNOSIS — Z79899 Other long term (current) drug therapy: Secondary | ICD-10-CM | POA: Insufficient documentation

## 2022-12-20 DIAGNOSIS — N184 Chronic kidney disease, stage 4 (severe): Secondary | ICD-10-CM | POA: Diagnosis not present

## 2022-12-20 DIAGNOSIS — Z6841 Body Mass Index (BMI) 40.0 and over, adult: Secondary | ICD-10-CM | POA: Insufficient documentation

## 2022-12-20 DIAGNOSIS — I509 Heart failure, unspecified: Secondary | ICD-10-CM | POA: Diagnosis not present

## 2022-12-20 DIAGNOSIS — Z87891 Personal history of nicotine dependence: Secondary | ICD-10-CM | POA: Diagnosis not present

## 2022-12-20 DIAGNOSIS — G4733 Obstructive sleep apnea (adult) (pediatric): Secondary | ICD-10-CM | POA: Diagnosis not present

## 2022-12-20 DIAGNOSIS — E1122 Type 2 diabetes mellitus with diabetic chronic kidney disease: Secondary | ICD-10-CM | POA: Diagnosis not present

## 2022-12-20 DIAGNOSIS — I130 Hypertensive heart and chronic kidney disease with heart failure and stage 1 through stage 4 chronic kidney disease, or unspecified chronic kidney disease: Secondary | ICD-10-CM | POA: Diagnosis not present

## 2022-12-20 DIAGNOSIS — R6889 Other general symptoms and signs: Secondary | ICD-10-CM | POA: Diagnosis not present

## 2022-12-20 DIAGNOSIS — J449 Chronic obstructive pulmonary disease, unspecified: Secondary | ICD-10-CM | POA: Insufficient documentation

## 2022-12-20 HISTORY — PX: RIGHT HEART CATH: CATH118263

## 2022-12-20 LAB — POCT I-STAT EG7
Acid-Base Excess: 1 mmol/L (ref 0.0–2.0)
Acid-base deficit: 1 mmol/L (ref 0.0–2.0)
Bicarbonate: 23.9 mmol/L (ref 20.0–28.0)
Bicarbonate: 26.2 mmol/L (ref 20.0–28.0)
Calcium, Ion: 1.1 mmol/L — ABNORMAL LOW (ref 1.15–1.40)
Calcium, Ion: 1.26 mmol/L (ref 1.15–1.40)
HCT: 33 % — ABNORMAL LOW (ref 36.0–46.0)
HCT: 35 % — ABNORMAL LOW (ref 36.0–46.0)
Hemoglobin: 11.2 g/dL — ABNORMAL LOW (ref 12.0–15.0)
Hemoglobin: 11.9 g/dL — ABNORMAL LOW (ref 12.0–15.0)
O2 Saturation: 68 %
O2 Saturation: 69 %
Potassium: 3.6 mmol/L (ref 3.5–5.1)
Potassium: 4 mmol/L (ref 3.5–5.1)
Sodium: 144 mmol/L (ref 135–145)
Sodium: 147 mmol/L — ABNORMAL HIGH (ref 135–145)
TCO2: 25 mmol/L (ref 22–32)
TCO2: 27 mmol/L (ref 22–32)
pCO2, Ven: 38.8 mmHg — ABNORMAL LOW (ref 44–60)
pCO2, Ven: 41.6 mmHg — ABNORMAL LOW (ref 44–60)
pH, Ven: 7.397 (ref 7.25–7.43)
pH, Ven: 7.407 (ref 7.25–7.43)
pO2, Ven: 35 mmHg (ref 32–45)
pO2, Ven: 36 mmHg (ref 32–45)

## 2022-12-20 LAB — SURGICAL PATHOLOGY

## 2022-12-20 LAB — GLUCOSE, CAPILLARY: Glucose-Capillary: 120 mg/dL — ABNORMAL HIGH (ref 70–99)

## 2022-12-20 LAB — CERVICOVAGINAL ANCILLARY ONLY
Bacterial Vaginitis (gardnerella): NEGATIVE
Candida Glabrata: NEGATIVE
Candida Vaginitis: NEGATIVE
Comment: NEGATIVE
Comment: NEGATIVE
Comment: NEGATIVE

## 2022-12-20 SURGERY — RIGHT HEART CATH
Anesthesia: LOCAL

## 2022-12-20 MED ORDER — MIDAZOLAM HCL 2 MG/2ML IJ SOLN
INTRAMUSCULAR | Status: AC
  Filled 2022-12-20: qty 2

## 2022-12-20 MED ORDER — ONDANSETRON HCL 4 MG/2ML IJ SOLN: 4.0000 mg | Freq: Four times a day (QID) | INTRAMUSCULAR | Status: DC | PRN

## 2022-12-20 MED ORDER — HEPARIN (PORCINE) IN NACL 1000-0.9 UT/500ML-% IV SOLN
INTRAVENOUS | Status: AC
  Filled 2022-12-20: qty 500

## 2022-12-20 MED ORDER — FENTANYL CITRATE (PF) 100 MCG/2ML IJ SOLN
INTRAMUSCULAR | Status: AC
  Filled 2022-12-20: qty 2

## 2022-12-20 MED ORDER — SODIUM CHLORIDE 0.9 % IV SOLN: 250.0000 mL | INTRAVENOUS | Status: DC | PRN

## 2022-12-20 MED ORDER — ACETAMINOPHEN 325 MG PO TABS: 650.0000 mg | ORAL_TABLET | ORAL | Status: DC | PRN

## 2022-12-20 MED ORDER — SODIUM CHLORIDE 0.9% FLUSH: 3.0000 mL | Freq: Two times a day (BID) | INTRAVENOUS | Status: DC

## 2022-12-20 MED ORDER — LIDOCAINE HCL (PF) 1 % IJ SOLN
INTRAMUSCULAR | Status: DC | PRN
  Administered 2022-12-20: 2 mL

## 2022-12-20 MED ORDER — HEPARIN (PORCINE) IN NACL 1000-0.9 UT/500ML-% IV SOLN
INTRAVENOUS | Status: DC | PRN
  Administered 2022-12-20: 500 mL

## 2022-12-20 MED ORDER — LIDOCAINE HCL (PF) 1 % IJ SOLN
INTRAMUSCULAR | Status: AC
  Filled 2022-12-20: qty 30

## 2022-12-20 MED ORDER — FENTANYL CITRATE (PF) 100 MCG/2ML IJ SOLN
INTRAMUSCULAR | Status: DC | PRN
  Administered 2022-12-20: 25 ug via INTRAVENOUS

## 2022-12-20 MED ORDER — LABETALOL HCL 5 MG/ML IV SOLN: 10.0000 mg | INTRAVENOUS | Status: DC | PRN

## 2022-12-20 MED ORDER — SODIUM CHLORIDE 0.9% FLUSH: 3.0000 mL | INTRAVENOUS | Status: DC | PRN

## 2022-12-20 MED ORDER — MIDAZOLAM HCL 2 MG/2ML IJ SOLN
INTRAMUSCULAR | Status: DC | PRN
  Administered 2022-12-20: 1 mg via INTRAVENOUS

## 2022-12-20 MED ORDER — SODIUM CHLORIDE 0.9 % IV SOLN: INTRAVENOUS | Status: DC

## 2022-12-20 MED ORDER — HYDRALAZINE HCL 20 MG/ML IJ SOLN: 10.0000 mg | INTRAMUSCULAR | Status: DC | PRN

## 2022-12-20 SURGICAL SUPPLY — 5 items
CATH SWAN GANZ 7F STRAIGHT (CATHETERS) IMPLANT
GLIDESHEATH SLENDER 7FR .021G (SHEATH) IMPLANT
GUIDEWIRE .025 260CM (WIRE) IMPLANT
PACK CARDIAC CATHETERIZATION (CUSTOM PROCEDURE TRAY) ×1 IMPLANT
TRANSDUCER W/STOPCOCK (MISCELLANEOUS) ×1 IMPLANT

## 2022-12-20 NOTE — Interval H&P Note (Signed)
History and Physical Interval Note:  12/20/2022 1:26 PM  Sara Tran  has presented today for surgery, with the diagnosis of CHF.  The various methods of treatment have been discussed with the patient and family. After consideration of risks, benefits and other options for treatment, the patient has consented to  Procedure(s): RIGHT HEART CATH (N/A) as a surgical intervention.  The patient's history has been reviewed, patient examined, no change in status, stable for surgery.  I have reviewed the patient's chart and labs.  Questions were answered to the patient's satisfaction.     Karlin Binion

## 2022-12-21 ENCOUNTER — Other Ambulatory Visit: Payer: Self-pay | Admitting: Obstetrics & Gynecology

## 2022-12-21 ENCOUNTER — Telehealth: Payer: Self-pay | Admitting: *Deleted

## 2022-12-21 ENCOUNTER — Encounter (HOSPITAL_COMMUNITY): Payer: Self-pay | Admitting: Internal Medicine

## 2022-12-21 DIAGNOSIS — C55 Malignant neoplasm of uterus, part unspecified: Secondary | ICD-10-CM

## 2022-12-21 NOTE — Progress Notes (Signed)
Referral to gyn/onc

## 2022-12-21 NOTE — Telephone Encounter (Signed)
Spoke with the patient regarding the referral to GYN oncology. Patient scheduled as new patient with Dr Berline Lopes on 1/12 at 11:15 am. Patient given an arrival time of 10:45.  Explained to the patient the the doctor will perform a pelvic exam at this visit. Patient given the policy that no visitors under the 16 yrs are allowed in the [place]. Patient given the address/phone number for the clinic and that the center offers free valet service. Patient aware of the new mask mandate.

## 2022-12-23 ENCOUNTER — Encounter (HOSPITAL_COMMUNITY): Payer: Self-pay | Admitting: Emergency Medicine

## 2022-12-23 ENCOUNTER — Inpatient Hospital Stay (HOSPITAL_COMMUNITY)
Admission: EM | Admit: 2022-12-23 | Discharge: 2022-12-26 | DRG: 291 | Disposition: A | Discharge status: Home / Self Care | Payer: Medicare HMO | Attending: Internal Medicine | Admitting: Internal Medicine

## 2022-12-23 ENCOUNTER — Emergency Department (HOSPITAL_COMMUNITY): Payer: Medicare HMO

## 2022-12-23 ENCOUNTER — Other Ambulatory Visit: Payer: Self-pay

## 2022-12-23 DIAGNOSIS — I700 Atherosclerosis of aorta: Secondary | ICD-10-CM | POA: Diagnosis not present

## 2022-12-23 DIAGNOSIS — R6510 Systemic inflammatory response syndrome (SIRS) of non-infectious origin without acute organ dysfunction: Secondary | ICD-10-CM | POA: Diagnosis not present

## 2022-12-23 DIAGNOSIS — E119 Type 2 diabetes mellitus without complications: Secondary | ICD-10-CM

## 2022-12-23 DIAGNOSIS — J4489 Other specified chronic obstructive pulmonary disease: Secondary | ICD-10-CM | POA: Diagnosis present

## 2022-12-23 DIAGNOSIS — R072 Precordial pain: Secondary | ICD-10-CM | POA: Diagnosis not present

## 2022-12-23 DIAGNOSIS — Z79899 Other long term (current) drug therapy: Secondary | ICD-10-CM

## 2022-12-23 DIAGNOSIS — Z881 Allergy status to other antibiotic agents status: Secondary | ICD-10-CM

## 2022-12-23 DIAGNOSIS — Z1152 Encounter for screening for COVID-19: Secondary | ICD-10-CM | POA: Diagnosis not present

## 2022-12-23 DIAGNOSIS — I272 Pulmonary hypertension, unspecified: Secondary | ICD-10-CM | POA: Diagnosis present

## 2022-12-23 DIAGNOSIS — G4733 Obstructive sleep apnea (adult) (pediatric): Secondary | ICD-10-CM | POA: Diagnosis present

## 2022-12-23 DIAGNOSIS — Z7951 Long term (current) use of inhaled steroids: Secondary | ICD-10-CM

## 2022-12-23 DIAGNOSIS — I2729 Other secondary pulmonary hypertension: Secondary | ICD-10-CM | POA: Diagnosis present

## 2022-12-23 DIAGNOSIS — I5033 Acute on chronic diastolic (congestive) heart failure: Secondary | ICD-10-CM | POA: Diagnosis present

## 2022-12-23 DIAGNOSIS — Z91148 Patient's other noncompliance with medication regimen for other reason: Secondary | ICD-10-CM

## 2022-12-23 DIAGNOSIS — I252 Old myocardial infarction: Secondary | ICD-10-CM

## 2022-12-23 DIAGNOSIS — Z823 Family history of stroke: Secondary | ICD-10-CM

## 2022-12-23 DIAGNOSIS — Z91040 Latex allergy status: Secondary | ICD-10-CM | POA: Diagnosis not present

## 2022-12-23 DIAGNOSIS — Z888 Allergy status to other drugs, medicaments and biological substances status: Secondary | ICD-10-CM | POA: Diagnosis not present

## 2022-12-23 DIAGNOSIS — I2510 Atherosclerotic heart disease of native coronary artery without angina pectoris: Secondary | ICD-10-CM | POA: Diagnosis present

## 2022-12-23 DIAGNOSIS — G478 Other sleep disorders: Secondary | ICD-10-CM | POA: Diagnosis not present

## 2022-12-23 DIAGNOSIS — R079 Chest pain, unspecified: Secondary | ICD-10-CM | POA: Diagnosis present

## 2022-12-23 DIAGNOSIS — R0602 Shortness of breath: Secondary | ICD-10-CM | POA: Diagnosis not present

## 2022-12-23 DIAGNOSIS — E1122 Type 2 diabetes mellitus with diabetic chronic kidney disease: Secondary | ICD-10-CM | POA: Diagnosis present

## 2022-12-23 DIAGNOSIS — Z91030 Bee allergy status: Secondary | ICD-10-CM | POA: Diagnosis not present

## 2022-12-23 DIAGNOSIS — K219 Gastro-esophageal reflux disease without esophagitis: Secondary | ICD-10-CM | POA: Diagnosis present

## 2022-12-23 DIAGNOSIS — Z841 Family history of disorders of kidney and ureter: Secondary | ICD-10-CM

## 2022-12-23 DIAGNOSIS — E1165 Type 2 diabetes mellitus with hyperglycemia: Secondary | ICD-10-CM | POA: Diagnosis not present

## 2022-12-23 DIAGNOSIS — N184 Chronic kidney disease, stage 4 (severe): Secondary | ICD-10-CM | POA: Diagnosis present

## 2022-12-23 DIAGNOSIS — Z833 Family history of diabetes mellitus: Secondary | ICD-10-CM

## 2022-12-23 DIAGNOSIS — Z7985 Long-term (current) use of injectable non-insulin antidiabetic drugs: Secondary | ICD-10-CM

## 2022-12-23 DIAGNOSIS — D72829 Elevated white blood cell count, unspecified: Secondary | ICD-10-CM | POA: Diagnosis present

## 2022-12-23 DIAGNOSIS — Z955 Presence of coronary angioplasty implant and graft: Secondary | ICD-10-CM

## 2022-12-23 DIAGNOSIS — I4891 Unspecified atrial fibrillation: Secondary | ICD-10-CM | POA: Diagnosis present

## 2022-12-23 DIAGNOSIS — Z6841 Body Mass Index (BMI) 40.0 and over, adult: Secondary | ICD-10-CM

## 2022-12-23 DIAGNOSIS — I509 Heart failure, unspecified: Secondary | ICD-10-CM

## 2022-12-23 DIAGNOSIS — Z87891 Personal history of nicotine dependence: Secondary | ICD-10-CM | POA: Diagnosis not present

## 2022-12-23 DIAGNOSIS — Z8249 Family history of ischemic heart disease and other diseases of the circulatory system: Secondary | ICD-10-CM | POA: Diagnosis not present

## 2022-12-23 DIAGNOSIS — Z91011 Allergy to milk products: Secondary | ICD-10-CM

## 2022-12-23 DIAGNOSIS — I10 Essential (primary) hypertension: Secondary | ICD-10-CM | POA: Diagnosis present

## 2022-12-23 DIAGNOSIS — J9811 Atelectasis: Secondary | ICD-10-CM | POA: Diagnosis not present

## 2022-12-23 DIAGNOSIS — Z803 Family history of malignant neoplasm of breast: Secondary | ICD-10-CM

## 2022-12-23 DIAGNOSIS — I130 Hypertensive heart and chronic kidney disease with heart failure and stage 1 through stage 4 chronic kidney disease, or unspecified chronic kidney disease: Principal | ICD-10-CM | POA: Diagnosis present

## 2022-12-23 DIAGNOSIS — C541 Malignant neoplasm of endometrium: Secondary | ICD-10-CM | POA: Diagnosis present

## 2022-12-23 DIAGNOSIS — E781 Pure hyperglyceridemia: Secondary | ICD-10-CM | POA: Diagnosis present

## 2022-12-23 DIAGNOSIS — R109 Unspecified abdominal pain: Secondary | ICD-10-CM | POA: Diagnosis not present

## 2022-12-23 DIAGNOSIS — G8929 Other chronic pain: Secondary | ICD-10-CM | POA: Diagnosis present

## 2022-12-23 DIAGNOSIS — M5431 Sciatica, right side: Secondary | ICD-10-CM | POA: Diagnosis present

## 2022-12-23 DIAGNOSIS — R0789 Other chest pain: Secondary | ICD-10-CM | POA: Diagnosis not present

## 2022-12-23 LAB — URINALYSIS, ROUTINE W REFLEX MICROSCOPIC
Bacteria, UA: NONE SEEN
Bilirubin Urine: NEGATIVE
Glucose, UA: NEGATIVE mg/dL
Ketones, ur: NEGATIVE mg/dL
Leukocytes,Ua: NEGATIVE
Nitrite: NEGATIVE
Protein, ur: NEGATIVE mg/dL
Specific Gravity, Urine: 1.009 (ref 1.005–1.030)
pH: 5 (ref 5.0–8.0)

## 2022-12-23 LAB — CBC WITH DIFFERENTIAL/PLATELET
Abs Immature Granulocytes: 0.09 10*3/uL — ABNORMAL HIGH (ref 0.00–0.07)
Basophils Absolute: 0 10*3/uL (ref 0.0–0.1)
Basophils Relative: 0 %
Eosinophils Absolute: 0 10*3/uL (ref 0.0–0.5)
Eosinophils Relative: 0 %
HCT: 35.7 % — ABNORMAL LOW (ref 36.0–46.0)
Hemoglobin: 11.5 g/dL — ABNORMAL LOW (ref 12.0–15.0)
Immature Granulocytes: 1 %
Lymphocytes Relative: 5 %
Lymphs Abs: 0.8 10*3/uL (ref 0.7–4.0)
MCH: 32.9 pg (ref 26.0–34.0)
MCHC: 32.2 g/dL (ref 30.0–36.0)
MCV: 102 fL — ABNORMAL HIGH (ref 80.0–100.0)
Monocytes Absolute: 0.4 10*3/uL (ref 0.1–1.0)
Monocytes Relative: 2 %
Neutro Abs: 13.6 10*3/uL — ABNORMAL HIGH (ref 1.7–7.7)
Neutrophils Relative %: 92 %
Platelets: 289 10*3/uL (ref 150–400)
RBC: 3.5 MIL/uL — ABNORMAL LOW (ref 3.87–5.11)
RDW: 13.1 % (ref 11.5–15.5)
WBC: 14.9 10*3/uL — ABNORMAL HIGH (ref 4.0–10.5)
nRBC: 0 % (ref 0.0–0.2)

## 2022-12-23 LAB — HEPATIC FUNCTION PANEL
ALT: 16 U/L (ref 0–44)
AST: 24 U/L (ref 15–41)
Albumin: 3.8 g/dL (ref 3.5–5.0)
Alkaline Phosphatase: 45 U/L (ref 38–126)
Bilirubin, Direct: 0.1 mg/dL (ref 0.0–0.2)
Indirect Bilirubin: 0.3 mg/dL (ref 0.3–0.9)
Total Bilirubin: 0.4 mg/dL (ref 0.3–1.2)
Total Protein: 7.5 g/dL (ref 6.5–8.1)

## 2022-12-23 LAB — PROCALCITONIN: Procalcitonin: <0.1 ng/mL

## 2022-12-23 LAB — GLUCOSE, CAPILLARY: Glucose-Capillary: 83 mg/dL (ref 70–99)

## 2022-12-23 LAB — BASIC METABOLIC PANEL
Anion gap: 10 (ref 5–15)
BUN: 32 mg/dL — ABNORMAL HIGH (ref 8–23)
CO2: 24 mmol/L (ref 22–32)
Calcium: 9.2 mg/dL (ref 8.9–10.3)
Chloride: 109 mmol/L (ref 98–111)
Creatinine, Ser: 2 mg/dL — ABNORMAL HIGH (ref 0.44–1.00)
GFR, Estimated: 27 mL/min — ABNORMAL LOW (ref >60)
Glucose, Bld: 186 mg/dL — ABNORMAL HIGH (ref 70–99)
Potassium: 4 mmol/L (ref 3.5–5.1)
Sodium: 143 mmol/L (ref 135–145)

## 2022-12-23 LAB — RESP PANEL BY RT-PCR (RSV, FLU A&B, COVID)  RVPGX2
Influenza A by PCR: NEGATIVE
Influenza B by PCR: NEGATIVE
Resp Syncytial Virus by PCR: NEGATIVE
SARS Coronavirus 2 by RT PCR: NEGATIVE

## 2022-12-23 LAB — TROPONIN I (HIGH SENSITIVITY)
Troponin I (High Sensitivity): 10 ng/L (ref <18)
Troponin I (High Sensitivity): 11 ng/L (ref <18)

## 2022-12-23 LAB — PROTIME-INR
INR: 1.1 (ref 0.8–1.2)
Prothrombin Time: 14.2 seconds (ref 11.4–15.2)

## 2022-12-23 LAB — LACTIC ACID, PLASMA
Lactic Acid, Venous: 3 mmol/L (ref 0.5–1.9)
Lactic Acid, Venous: 2.1 mmol/L (ref 0.5–1.9)

## 2022-12-23 LAB — APTT: aPTT: 25 seconds (ref 24–36)

## 2022-12-23 LAB — BRAIN NATRIURETIC PEPTIDE: B Natriuretic Peptide: 65 pg/mL (ref 0.0–100.0)

## 2022-12-23 MED ORDER — INSULIN ASPART 100 UNIT/ML IJ SOLN
0.0000 [IU] | Freq: Three times a day (TID) | INTRAMUSCULAR | Status: DC
  Administered 2022-12-24 – 2022-12-26 (×4): 2 [IU] via SUBCUTANEOUS

## 2022-12-23 MED ORDER — ACETAMINOPHEN 325 MG PO TABS
650.0000 mg | ORAL_TABLET | Freq: Four times a day (QID) | ORAL | Status: DC | PRN
  Administered 2022-12-24: 650 mg via ORAL
  Filled 2022-12-23 (×3): qty 2

## 2022-12-23 MED ORDER — ONDANSETRON HCL 4 MG PO TABS: 4.0000 mg | ORAL_TABLET | Freq: Four times a day (QID) | ORAL | Status: DC | PRN

## 2022-12-23 MED ORDER — FUROSEMIDE 10 MG/ML IJ SOLN
40.0000 mg | Freq: Once | INTRAMUSCULAR | Status: AC
  Administered 2022-12-23: 40 mg via INTRAVENOUS
  Filled 2022-12-23: qty 4

## 2022-12-23 MED ORDER — NITROGLYCERIN 0.4 MG SL SUBL: 0.4000 mg | SUBLINGUAL_TABLET | SUBLINGUAL | Status: DC | PRN

## 2022-12-23 MED ORDER — LACTATED RINGERS IV SOLN
INTRAVENOUS | Status: DC
  Administered 2022-12-23: 150 mL/h via INTRAVENOUS

## 2022-12-23 MED ORDER — HEPARIN SODIUM (PORCINE) 5000 UNIT/ML IJ SOLN
5000.0000 [IU] | Freq: Three times a day (TID) | INTRAMUSCULAR | Status: DC
  Administered 2022-12-23 – 2022-12-26 (×8): 5000 [IU] via SUBCUTANEOUS
  Filled 2022-12-23 (×9): qty 1

## 2022-12-23 MED ORDER — LACTATED RINGERS IV BOLUS (SEPSIS)
600.0000 mL | Freq: Once | INTRAVENOUS | Status: AC
  Administered 2022-12-23: 600 mL via INTRAVENOUS

## 2022-12-23 MED ORDER — ACETAMINOPHEN 650 MG RE SUPP: 650.0000 mg | Freq: Four times a day (QID) | RECTAL | Status: DC | PRN

## 2022-12-23 MED ORDER — FUROSEMIDE 10 MG/ML IJ SOLN
40.0000 mg | Freq: Two times a day (BID) | INTRAMUSCULAR | Status: AC
  Administered 2022-12-24 (×2): 40 mg via INTRAVENOUS
  Filled 2022-12-23 (×2): qty 4

## 2022-12-23 MED ORDER — ALBUTEROL SULFATE (2.5 MG/3ML) 0.083% IN NEBU: 2.5000 mg | INHALATION_SOLUTION | RESPIRATORY_TRACT | Status: DC | PRN

## 2022-12-23 MED ORDER — METRONIDAZOLE 500 MG/100ML IV SOLN
500.0000 mg | Freq: Once | INTRAVENOUS | Status: AC
  Administered 2022-12-23: 500 mg via INTRAVENOUS
  Filled 2022-12-23: qty 100

## 2022-12-23 MED ORDER — MOMETASONE FURO-FORMOTEROL FUM 200-5 MCG/ACT IN AERO
2.0000 | INHALATION_SPRAY | Freq: Two times a day (BID) | RESPIRATORY_TRACT | Status: DC
  Administered 2022-12-24 – 2022-12-26 (×5): 2 via RESPIRATORY_TRACT
  Filled 2022-12-23: qty 8.8

## 2022-12-23 MED ORDER — VANCOMYCIN HCL 1250 MG/250ML IV SOLN: 1250.0000 mg | INTRAVENOUS | Status: DC

## 2022-12-23 MED ORDER — VANCOMYCIN HCL 2000 MG/400ML IV SOLN
2000.0000 mg | Freq: Once | INTRAVENOUS | Status: AC
  Administered 2022-12-23: 2000 mg via INTRAVENOUS
  Filled 2022-12-23: qty 400

## 2022-12-23 MED ORDER — VANCOMYCIN HCL IN DEXTROSE 1-5 GM/200ML-% IV SOLN: 1000.0000 mg | Freq: Once | INTRAVENOUS | Status: DC

## 2022-12-23 MED ORDER — INSULIN ASPART 100 UNIT/ML IJ SOLN: 0.0000 [IU] | Freq: Every day | INTRAMUSCULAR | Status: DC

## 2022-12-23 MED ORDER — NYSTATIN 100000 UNIT/GM EX POWD
Freq: Two times a day (BID) | CUTANEOUS | Status: DC
  Filled 2022-12-23: qty 15

## 2022-12-23 MED ORDER — SODIUM CHLORIDE 0.9 % IV SOLN
2.0000 g | Freq: Once | INTRAVENOUS | Status: DC
  Filled 2022-12-23: qty 10

## 2022-12-23 MED ORDER — ONDANSETRON HCL 4 MG/2ML IJ SOLN
4.0000 mg | Freq: Four times a day (QID) | INTRAMUSCULAR | Status: DC | PRN
  Administered 2022-12-24 – 2022-12-25 (×2): 4 mg via INTRAVENOUS
  Filled 2022-12-23 (×2): qty 2

## 2022-12-23 MED ORDER — POLYETHYLENE GLYCOL 3350 17 G PO PACK: 17.0000 g | PACK | Freq: Every day | ORAL | Status: DC | PRN

## 2022-12-23 MED ORDER — ISOSORBIDE MONONITRATE ER 60 MG PO TB24
30.0000 mg | ORAL_TABLET | Freq: Every day | ORAL | Status: DC
  Administered 2022-12-24 – 2022-12-26 (×3): 30 mg via ORAL
  Filled 2022-12-23 (×4): qty 1

## 2022-12-23 MED ORDER — SODIUM CHLORIDE 0.9 % IV SOLN
1.0000 g | Freq: Three times a day (TID) | INTRAVENOUS | Status: DC
  Filled 2022-12-23 (×3): qty 5

## 2022-12-23 MED ORDER — LACTATED RINGERS IV BOLUS (SEPSIS)
1000.0000 mL | Freq: Once | INTRAVENOUS | Status: AC
  Administered 2022-12-23: 1000 mL via INTRAVENOUS

## 2022-12-23 NOTE — Assessment & Plan Note (Signed)
Controlled A1c 6.1. - SSI_ M -Hold Trulicity

## 2022-12-23 NOTE — Assessment & Plan Note (Addendum)
Leukocytosis of 14.9.  Initial heart rate of 105, has since resolved.  Lactic acid of 3 > 2.1, after 1.6 L bolus given.  CT chest abdomen and pelvis without acute abnormality.  UA not suggestive of infection.  At this time no focus of infection identified. -Follow-up blood cultures -Defer further antibiotics -procalcitonin <0.10 which is reassuring

## 2022-12-23 NOTE — ED Notes (Signed)
Pt complains of groin pain. Pt is raw in that area applied moisture barrier cream at this time.

## 2022-12-23 NOTE — Assessment & Plan Note (Signed)
Stable. -Resumed Norvasc, hydralazine, Imdur, losartan-HCTZ

## 2022-12-23 NOTE — ED Provider Notes (Signed)
[facility] EMERGENC[facility] DEPARTMENT Provider Note   CSN: [id number] Arrival date & time: 12/23/22  1210     History  Chief Complaint  Patient presents with   Chest Pain   Shortness of Breath    Sara Tran is a 66 y.o. female presents to the ED complaining of chest pain and shortness of breath that started this morning.  Patient does have history significant for CHF, pulmonary hypertension, OSA requiring CPAP, DM, recent diagnosis of endometrial cancer.  She also reports an episode of diarrhea earlier today with associated abdominal pain.  She states that she feels that she cannot take deep breaths, is getting out of breath very easily, and has a cough.  She has had no recent changes in her medications, and states she has trying to take her medications as prescribed.  Denies fever, chills, nausea, vomiting, syncope, lightheadedness, leg swelling, congestion, dysuria.       Home Medications Prior to Admission medications   Medication Sig Start Date End Date Taking? Authorizing Provider  albuterol (PROVENTIL) (2.5 MG/3ML) 0.083% nebulizer solution Take 2.5 mg by nebulization every 4 (four) hours as needed for wheezing or shortness of breath.  09/19/15   [provider]  albuterol (VENTOLIN HFA) 108 (90 Base) MCG/ACT inhaler Inhale 2 puffs into the lungs every 6 (six) hours as needed for wheezing or shortness of breath. 10/11/22   Alva, Rakesh V, MD  ALPRAZolam (XANAX) 0.5 MG tablet Take 0.5 mg by mouth at bedtime as needed for anxiety or sleep. 06/08/20   [provider]  amLODipine (NORVASC) 10 MG tablet Take 10 mg by mouth every evening.  04/10/13   [provider]  Ascorbic Acid (VITAMIN C) 500 MG CAPS Take 500 mg by mouth daily.    [provider]  baclofen (LIORESAL) 10 MG tablet TAKE 1 TO 2 TABLETS FOUR TIMES DAILY 12/20/22   Swartz, Zachary T, MD  Blood Glucose Calibration (TRUE METRIX LEVEL 2) Normal SOLN  05/30/21   [provider]   Blood Glucose Monitoring Suppl (TRUE METRIX AIR GLUCOSE METER) w/Device KIT  08/07/22   [provider]  Cholecalciferol (DIALYVITE VITAMIN D 5000) 125 MCG (5000 UT) capsule Take 5,000 Units by mouth once a week.    [provider]  clobetasol (TEMOVATE) 0.05 % external solution Apply 1 application  topically 2 (two) times daily. 04/15/19   [provider]  Cyanocobalamin (B-12 PO) Take 1 mL by mouth daily.    [provider]  EPINEPHrine 0.3 mg/0.3 mL IJ SOAJ injection Inject 0.3 mg into the muscle as needed for anaphylaxis. 05/27/19   [provider]  fenofibrate 160 MG tablet Take 160 mg by mouth daily. 05/30/21   [provider]  fluticasone (FLONASE) 50 MCG/ACT nasal spray Place 1 spray into both nostrils daily.  06/07/20   [provider]  furosemide (LASIX) 40 MG tablet Take 40-80 mg by mouth 2 (two) times daily. 06/24/19   [provider]  gabapentin (NEURONTIN) 100 MG capsule Take 2 capsules (200 mg total) by mouth 4 (four) times daily. 11/23/22   Johnson, Clanford L, MD  guaiFENesin-dextromethorphan (ROBITUSSIN DM) 100-10 MG/5ML syrup Take 5 mLs by mouth every 4 (four) hours as needed for cough. Patient not taking: Reported on 12/19/2022 11/23/22   Johnson, Clanford L, MD  hydrALAZINE (APRESOLINE) 25 MG tablet Take 1 tablet (25 mg total) by mouth 3 (three) times daily. Patient not taking: Reported on 12/19/2022 12/14/22   Milford, Jessica M,   FNP  isosorbide mononitrate (IMDUR) 30 MG 24 hr tablet Take 30 mg by mouth daily.  09/05/11   [provider]  levocetirizine (XYZAL) 5 MG tablet Take 5 mg by mouth at bedtime.    [provider]  linaclotide (LINZESS) 72 MCG capsule Take 1 capsule (72 mcg total) by mouth daily before breakfast. 05/31/22 05/31/23  Carver, Charles K, DO  losartan-hydrochlorothiazide (HYZAAR) 100-25 MG tablet Take 1 tablet by mouth daily.    [provider]  magnesium oxide (MAG-OX)  400 (240 Mg) MG tablet Take 400 mg by mouth daily.    [provider]  Multiple Vitamin (MULTIVITAMIN WITH MINERALS) TABS tablet Take 1 tablet by mouth daily.    [provider]  nitroGLYCERIN (NITROSTAT) 0.4 MG SL tablet Place 0.4 mg under the tongue every 5 (five) minutes as needed for chest pain.    [provider]  nystatin ointment (MYCOSTATIN) Apply 1 Application topically 2 (two) times daily for 14 days. 12/19/22 01/02/23  Ozan, Jennifer, DO  ondansetron (ZOFRAN) 4 MG tablet Take 4 mg by mouth every 8 (eight) hours as needed for nausea or vomiting.  11/05/19   [provider]  oxybutynin (DITROPAN-XL) 10 MG 24 hr tablet Take 1 tablet (10 mg total) by mouth daily as needed. 11/23/22   Johnson, Clanford L, MD  Oxycodone HCl 10 MG TABS Take 10 mg by mouth every 6 (six) hours as needed (pain). 10/12/22   [provider]  potassium chloride SA (K-DUR) 20 MEQ tablet Take 20 mEq by mouth daily.  06/12/19   [provider]  pravastatin (PRAVACHOL) 40 MG tablet Take 40 mg by mouth daily. 09/11/21   [provider]  predniSONE (DELTASONE) 10 MG tablet Take 1 tablet (10 mg total) by mouth daily with breakfast. Patient not taking: Reported on 12/19/2022 12/12/22   Alva, Rakesh V, MD  RESTASIS 0.05 % ophthalmic emulsion Place 1 drop into both eyes 2 (two) times daily. 08/27/22   [provider]  SYMBICORT 160-4.5 MCG/ACT inhaler Inhale 2 puffs into the lungs 2 (two) times daily. 07/24/21   Alva, Rakesh V, MD  TRUE METRIX BLOOD GLUCOSE TEST test strip  05/30/21   [provider]  TRUEplus Lancets 33G MISC  05/30/21   [provider]  TRULICITY 1.5 MG/0.5ML SOPN Inject 1.5 mg into the skin every 7 (seven) days. 01/03/19   [provider]      Allergies    Ancef [cefazolin], Bee venom, Meloxicam, Nsaids, Ceftin [cefuroxime axetil], Chicken meat (diagnostic), Ciprofloxacin, Macrodantin [nitrofurantoin], Meat [alpha-gal],  Other, Latex, and Milk-related compounds    Review of Systems   Review of Systems  Constitutional:  Positive for fatigue. Negative for chills and fever.  HENT:  Negative for congestion.   Respiratory:  Positive for cough and shortness of breath.   Cardiovascular:  Positive for chest pain. Negative for leg swelling.  Gastrointestinal:  Positive for abdominal pain and diarrhea. Negative for nausea and vomiting.  Genitourinary:  Negative for dysuria.  Neurological:  Negative for syncope, weakness and light-headedness.    Physical Exam Updated Vital Signs BP (!) 158/74   Pulse 85   Temp 98.4 F (36.9 C) (Oral)   Resp 11   Ht 5' 2" (1.575 m)   Wt 112 kg   LMP 06/06/2013   SpO2 100%   BMI 45.18 kg/m  Physical Exam Vitals and nursing note reviewed.  Constitutional:      General: She is not in acute distress.      Appearance: She is obese. She is ill-appearing. She is not toxic-appearing.  HENT:     Nose: No congestion.     Mouth/Throat:     Mouth: Mucous membranes are moist.     Pharynx: Oropharynx is clear.  Eyes:     Conjunctiva/sclera: Conjunctivae normal.     Pupils: Pupils are equal, round, and reactive to light.  Cardiovascular:     Rate and Rhythm: Normal rate and regular rhythm.     Pulses: Normal pulses.     Heart sounds: Normal heart sounds.  Pulmonary:     Effort: Pulmonary effort is normal. No accessory muscle usage or respiratory distress.     Breath sounds: Normal air entry. Decreased breath sounds and rales present.     Comments: Patient unable to take a deep breath on exam, decreased breath sounds, rales present in lung bases Abdominal:     General: Abdomen is flat. Bowel sounds are normal. There is no distension.     Palpations: Abdomen is soft.     Tenderness: There is generalized abdominal tenderness. There is no guarding.  Musculoskeletal:     Right lower leg: No edema.     Left lower leg: No edema.  Skin:    General: Skin is warm and dry.      Capillary Refill: Capillary refill takes less than 2 seconds.  Neurological:     Mental Status: She is alert. Mental status is at baseline.  Psychiatric:        Mood and Affect: Mood normal.        Behavior: Behavior normal.     ED Results / Procedures / Treatments   Labs (all labs ordered are listed, but only abnormal results are displayed) Labs Reviewed  CBC WITH DIFFERENTIAL/PLATELET - Abnormal; Notable for the following components:      Result Value   WBC 14.9 (*)    RBC 3.50 (*)    Hemoglobin 11.5 (*)    HCT 35.7 (*)    MCV 102.0 (*)    Neutro Abs 13.6 (*)    Abs Immature Granulocytes 0.09 (*)    All other components within normal limits  BASIC METABOLIC PANEL - Abnormal; Notable for the following components:   Glucose, Bld 186 (*)    BUN 32 (*)    Creatinine, Ser 2.00 (*)    GFR, Estimated 27 (*)    All other components within normal limits  URINALYSIS, ROUTINE W REFLEX MICROSCOPIC - Abnormal; Notable for the following components:   Color, Urine STRAW (*)    Hgb urine dipstick SMALL (*)    All other components within normal limits  LACTIC ACID, PLASMA - Abnormal; Notable for the following components:   Lactic Acid, Venous 3.0 (*)    All other components within normal limits  LACTIC ACID, PLASMA - Abnormal; Notable for the following components:   Lactic Acid, Venous 2.1 (*)    All other components within normal limits  RESP PANEL BY RT-PCR (RSV, FLU A&B, COVID)  [id number]  CULTURE, BLOOD (ROUTINE X 2)  CULTURE, BLOOD (ROUTINE X 2)  BRAIN NATRIURETIC PEPTIDE  PROTIME-INR  APTT  HEPATIC FUNCTION PANEL  TROPONIN I (HIGH SENSITIVITY)  TROPONIN I (HIGH SENSITIVITY)    EKG EKG Interpretation  Date/Time:  Sunday December 23 2022 12:23:55 EST Ventricular Rate:  102 PR Interval:  126 QRS Duration: 74 QT Interval:  306 QTC Calculation: 398 R Axis:   -35 Text Interpretation: Sinus tachycardia Left axis deviation Nonspecific ST and T  wave abnormality Abnormal ECG  When compared with ECG of 14-Dec-2022 15:20, No significant change was found Confirmed by Godfrey Pick [phone]) on 12/23/2022 2:54:29 PM  Radiology CT Chest Wo Contrast  Result Date: 12/23/2022 CLINICAL DATA:  Respiratory illness. Sepsis. Chest pain and shortness of breath since this morning. EXAM: CT CHEST, ABDOMEN AND PELVIS WITHOUT CONTRAST TECHNIQUE: Multidetector CT imaging of the chest, abdomen and pelvis was performed following the standard protocol without IV contrast. RADIATION DOSE REDUCTION: This exam was performed according to the departmental dose-optimization program which includes automated exposure control, adjustment of the mA and/or kV according to patient size and/or use of iterative reconstruction technique. COMPARISON:  06/28/2022. FINDINGS: CT CHEST FINDINGS Cardiovascular: Heart normal in size and configuration. No pericardial effusion. Minimal left coronary artery calcifications. Great vessels are normal in caliber. Mild aortic atherosclerotic calcifications. Mediastinum/Nodes: No enlarged mediastinal, hilar, or axillary lymph nodes. Thyroid gland, trachea, and esophagus demonstrate no significant findings. Lungs/Pleura: Lungs are clear. No pleural effusion or pneumothorax. Musculoskeletal: No fracture or acute finding. No bone lesion. No chest wall mass. CT ABDOMEN PELVIS FINDINGS Hepatobiliary: No focal liver abnormality is seen. Status post cholecystectomy. No biliary dilatation. Pancreas: Unremarkable. No pancreatic ductal dilatation or surrounding inflammatory changes. Spleen: Normal in size without focal abnormality. Adrenals/Urinary Tract: Normal adrenal glands. 4 mm stone, lower pole the right kidney. No renal masses, other stones or hydronephrosis. Ureters are normal in course and in caliber. Bladder is unremarkable. Stomach/Bowel: Well positioned lap band at the gastroesophageal junction. This is stable. Stomach unremarkable. Small bowel and colon are normal in caliber. No wall  thickening. No inflammation. Vascular/Lymphatic: Aortic atherosclerotic calcifications. No aneurysm. No enlarged lymph nodes. Reproductive: Densely calcified exophytic fibroid extends toward the left adnexa from the left upper uterine segment, 6 cm in size, unchanged. Smaller uterine calcifications suggest additional small fibroids. No adnexal masses. Other: No abdominal wall hernia or abnormality. No abdominopelvic ascites. Musculoskeletal: No fracture or acute finding.  No bone lesion. IMPRESSION: 1. No acute findings within the chest, abdomen or pelvis. No evidence of infection. Findings stable from the prior abdomen and pelvis CT from 06/28/2022. 2. Aortic atherosclerosis. 3. Nonobstructing stone in the lower pole the right kidney. 4. Calcified uterine fibroids. Electronically Signed   By: Lajean Manes M.D.   On: 12/23/2022 16:05   CT ABDOMEN PELVIS WO CONTRAST  Result Date: 12/23/2022 CLINICAL DATA:  Respiratory illness. Sepsis. Chest pain and shortness of breath since this morning. EXAM: CT CHEST, ABDOMEN AND PELVIS WITHOUT CONTRAST TECHNIQUE: Multidetector CT imaging of the chest, abdomen and pelvis was performed following the standard protocol without IV contrast. RADIATION DOSE REDUCTION: This exam was performed according to the departmental dose-optimization program which includes automated exposure control, adjustment of the mA and/or kV according to patient size and/or use of iterative reconstruction technique. COMPARISON:  06/28/2022. FINDINGS: CT CHEST FINDINGS Cardiovascular: Heart normal in size and configuration. No pericardial effusion. Minimal left coronary artery calcifications. Great vessels are normal in caliber. Mild aortic atherosclerotic calcifications. Mediastinum/Nodes: No enlarged mediastinal, hilar, or axillary lymph nodes. Thyroid gland, trachea, and esophagus demonstrate no significant findings. Lungs/Pleura: Lungs are clear. No pleural effusion or pneumothorax. Musculoskeletal:  No fracture or acute finding. No bone lesion. No chest wall mass. CT ABDOMEN PELVIS FINDINGS Hepatobiliary: No focal liver abnormality is seen. Status post cholecystectomy. No biliary dilatation. Pancreas: Unremarkable. No pancreatic ductal dilatation or surrounding inflammatory changes. Spleen: Normal in size without focal abnormality. Adrenals/Urinary Tract: Normal adrenal glands. 4 mm stone, lower pole the  right kidney. No renal masses, other stones or hydronephrosis. Ureters are normal in course and in caliber. Bladder is unremarkable. Stomach/Bowel: Well positioned lap band at the gastroesophageal junction. This is stable. Stomach unremarkable. Small bowel and colon are normal in caliber. No wall thickening. No inflammation. Vascular/Lymphatic: Aortic atherosclerotic calcifications. No aneurysm. No enlarged lymph nodes. Reproductive: Densely calcified exophytic fibroid extends toward the left adnexa from the left upper uterine segment, 6 cm in size, unchanged. Smaller uterine calcifications suggest additional small fibroids. No adnexal masses. Other: No abdominal wall hernia or abnormality. No abdominopelvic ascites. Musculoskeletal: No fracture or acute finding.  No bone lesion. IMPRESSION: 1. No acute findings within the chest, abdomen or pelvis. No evidence of infection. Findings stable from the prior abdomen and pelvis CT from 06/28/2022. 2. Aortic atherosclerosis. 3. Nonobstructing stone in the lower pole the right kidney. 4. Calcified uterine fibroids. Electronically Signed   By: David  Ormond M.D.   On: 12/23/2022 16:05   DG Chest Port 1 View  Result Date: 12/23/2022 CLINICAL DATA:  Shortness of breath and chest pain EXAM: PORTABLE CHEST 1 VIEW COMPARISON:  11/20/2019 FINDINGS: Cardiac enlargement and aortic atherosclerosis. Low lung volumes. The left costophrenic angle is ups cured in there may be a small left effusion. Subsegmental atelectasis noted in the right base. No frank interstitial edema.  IMPRESSION: 1. Suspect small left pleural effusion. 2. Low lung volumes. Electronically Signed   By: Taylor  Stroud M.D.   On: 12/23/2022 13:23    Procedures Procedures    Medications Ordered in ED Medications  lactated ringers infusion (150 mL/hr Intravenous New Bag/Given 12/23/22 1622)  lactated ringers bolus 1,000 mL (1,000 mLs Intravenous New Bag/Given 12/23/22 1636)    And  lactated ringers bolus 600 mL (has no administration in time range)  metroNIDAZOLE (FLAGYL) IVPB 500 mg (500 mg Intravenous New Bag/Given 12/23/22 1648)  aztreonam (AZACTAM) 1 g in sodium chloride 0.9 % 100 mL IVPB (has no administration in time range)  vancomycin (VANCOREADY) IVPB 2000 mg/400 mL (has no administration in time range)  vancomycin (VANCOREADY) IVPB 1250 mg/250 mL (has no administration in time range)  furosemide (LASIX) injection 40 mg (40 mg Intravenous Given 12/23/22 1503)    ED Course/ Medical Decision Making/ A&P                           Medical Decision Making Amount and/or Complexity of Data Reviewed Labs: ordered.   This patient presents to the ED with chief complaint(s) of chest pain and shortness of breath with pertinent past medical history of CHF, pulmonary hypertension, OSA, CKD, obesity, recent diagnosis of endometrial cancer.  The complaint involves an extensive differential diagnosis and also carries with it a high risk of complications and morbidity.    The differential diagnosis includes acute on chronic CHF exacerbation, influenza, COVID, RSV, pneumonia, acute bronchitis, ACS  The initial plan is to obtain baseline labs including CBC, BMP, UA, troponin and a respiratory swab  Additional history obtained: Additional history obtained from  none Records reviewed  cardiology records  Initial Assessment:   On exam, patient is ill-appearing but nontoxic and nondiaphoretic.  On auscultation, patient is unable to take deep breaths, reduced tidal volume, rales auscultated in  bilateral bases.  Heart rate is in the 90s with regular rhythm.  SpO2 98% at time of exam.  Abdomen is soft and generally tender.  No bilateral pedal edema.  Skin is warm and dry.  No   cough appreciated on exam.  Independent ECG/labs interpretation:  The following labs were independently interpreted:  CBC significant for leukocytosis with a WBC of 14.9.  Given that patient has a heart rate >90 bmp and an elevated white count she now meets SIRS criteria.  ED evolving sepsis orders were placed. Metabolic panel significant for hyperglycemia with a glucose of 186, reduced kidney function with a creatinine of 2, BUN of 32, GFR estimated to be 27.  Patient has baseline of creatinine above 2.  Creatinine has improved since 12/14/2022 where it was 2.54.  No major electrolyte disturbances. UA significant for straw color and small amount of Hgb.  No ketonuria, proteinuria.  No evidence of UTI. BNP 65. Delta troponin negative. Initial lactic 3.0, repeat lactic 2.1  Independent visualization and interpretation of imaging: I independently visualized the following imaging with scope of interpretation limited to determining acute life threatening conditions related to emergency care: Chest x-ray, which revealed low lung volumes and pleural effusion on the left side.  Ordered CT chest, abdomen, pelvis without contrast.  Patient is septic with unknown source and has recent cancer diagnosis.  CT showed no evidence of intra-abdominal infection to explain patient's sepsis.  She does have large calcified uterine fibroids.   Treatment and Reassessment: Patient will be treated with broad spectrum antibiotics and IV fluid resuscitation per sepsis protocol.  Patient did not appear to be fluid overloaded and had a normal BNP.    Consultations obtained:   I requested consultation with hospitalist and spoke with on-call provider, Dr. Emokpae , who recommended hospital admission.   Disposition:   Patient to be admitted to  hospital for further treatment and management of condition.           Final Clinical Impression(s) / ED Diagnoses Final diagnoses:  SIRS (systemic inflammatory response syndrome) (HCC)  Chest pain, unspecified type  Shortness of breath    Rx / DC Orders ED Discharge Orders     None         Clark, Meghan R, PA 12/23/22 1743    Dixon, Ryan, MD 12/24/22 2145  

## 2022-12-23 NOTE — Assessment & Plan Note (Signed)
Reports 7 pound weight gain, has bilateral pitting lower extremity edema.  Per charts with since stable from recent discharge weight- 245, and 247 today.  BNP elevated at 65 compared to recent- 27.  CT chest without contrast negative for acute abnormality.  Not compliant with prescribed dose of Lasix. -Recent echo- 10/2022- EF 60 to 65%. -IV Lasix 40 mg x 2, further doses pending clinical course -Daily BMP -Strict input output, daily weights -I have requested a redsVest reading since was not done on admission

## 2022-12-23 NOTE — H&P (Signed)
History and Physical    Sara Tran ULA:[id number] DOB: [birth year] DOA: 12/23/2022  PCP: Sharilyn Sites, MD   Patient coming from: Home  I have personally briefly reviewed patient's old medical records in [place]  Chief Complaint: Difficulty breathing  HPI: Sara Tran is a 66 y.o. female with medical history significant for pulmonary hypertension, asthma and OSA, diabetes mellitus, CKD 4, diastolic CHF, atrial fibrillation, coronary artery disease. Patient presented to the ED with complaints of chest pain and difficulty breathing.  She reports chest pain has been ongoing over the past several years- ? 65yr, intermittent, left-sided radiating across to the right, described as pressure-like, sharp, provoked by activity and resolves with rest.  Today she was getting ready for church when chest pain started.  Later while she was in church she started having difficulty breathing, she was dizzy while standing and would have fallen but she reached out to hold the pew.  Reports she checks her weight daily, she reports 7 pound weight gain over the past several days.,  And that her normal weight is about 241 pounds.  Reports bilateral lower extremity swelling worse than baseline, and abdominal bloating.,  No cough, no fevers. She reports she is mostly compliant with 40 mg of Lasix once daily, but per med list she is supposed to be on 40 to 80 mg twice daily.  Recent hospitalization 11/20 to 11/24, for decompensated CHF, AKI on CKD, COPD exacerbation and hyperkalemia.  In the interim, patient also underwent endometrial biopsy 12/20, with findings of well-differentiated endometrioid Adenocarcinoma.  She is been referred to GYN/ONC Also followed up with Dr. BHaroldine Lawsfor right heart cath done 12/21-impression mild pulmonary hypertension with mildly elevated PVR.  ED Course: Tmax 98.4.  Heart rate 76-105.  Respirate rate 11-20.  Blood pressure systolic 1212-248  O2 sats 94 to 100% on room  air. BNP 65 WBC 14.9. Lactic acid 3> 2.1.. UA not suggestive of infection. Troponin 10 > 11 CT abdomen chest and pelvis-No acute findings, nonobstructing stone in lower pole of right kidney.  Lungs are clear no pleural effusion. IV Lasix 40 mg x 1 given, but due to leukocytosis and initial tachycardia, lactic acid of 3.1, blood cultures were obtained, she was started on aztreonam, vancomycin and metronidazole, 1.6 L bolus given and continued on LR at 150.  Review of Systems: As per HPI all other systems reviewed and negative.  Past Medical History:  Diagnosis Date   Adnexal cyst    Right simple cyst seen on [place]  Anginal pain (HCC)    Anxiety    Arthritis    Asthma    Atrial fibrillation (HCC)    hx of    CHF (congestive heart failure) (HCC)    Chronic back pain    Chronic hip pain    COPD (chronic obstructive pulmonary disease) ([place]    Coronary artery disease    stents placed    Dyspnea    GERD (gastroesophageal reflux disease)    Headache    due to pinched nerve damaage    Heart murmur    hx of slight murmur    History of bronchitis    History of cardiac catheterization    Normal coronaries 2013   History of kidney stones    Hot flashes    Hypertension    Hypertriglyceridemia    IBS (irritable bowel syndrome)    Internal hemorrhoids    Kidney disease    Morbid obesity ([place]  s/p lap band surgery   Myocardial infarction ([place]) 12/2018   times 3   Obesity    Pulmonary HTN ([place])    [place] 2008:  RA pressures 13/10 with a mean of 7 mmHg.  RV pressure 25/63 with end diastolic pressure of 15 mmHg.  PA pressure 49/23 with a mean of 35 mmHg.  Pulmonary capillary wedge 17/50 with a mean of 14 mmHg. The PA saturation was 68%.  RA saturation was 71% and aortic saturation was 90%.  Cardiac output was 6.0 with a cardiac index of 2.80 by Fick.  Normal coronaries by cath 2008.   Renal insufficiency    Sciatica of right side    Sleep apnea    CPAP use does not know settings     Trauma    Type 2 diabetes mellitus ([place])    type II    Uterine fibroid     Past Surgical History:  Procedure Laterality Date   accupuncture     APPENDECTOMY     CARDIAC CATHETERIZATION  07/2007, 05/2012   Normal coronary arteries   COLONOSCOPY  10/18/2010   SLF:2-mm sessile sigmoid polyp/diverticula, tortuous colon.  Biopsies of sigmoid colon polyp and random colon biopsies benign   COLONOSCOPY WITH PROPOFOL N/A 08/09/2020   Procedure: COLONOSCOPY WITH PROPOFOL;  Surgeon: Eloise Harman, DO;  [facility]: [facility];  Service: Endoscopy;  Laterality: N/A;  11:00am   CYSTOSCOPY/URETEROSCOPY/HOLMIUM LASER/STENT PLACEMENT Right 08/01/2017   Procedure: CYSTOSCOPY/ RIGHT URETEROSCOPY/ RIGHT RETROGRADE/ HOLMIUM LASER  AND RIGHT STENT PLACEMENT FIRST STAGE;  Surgeon: Irine Seal, MD;  Location: [facility];  Service: Urology;  Laterality: Right;   CYSTOSCOPY/URETEROSCOPY/HOLMIUM LASER/STENT PLACEMENT Right 08/08/2017   Procedure: RIGHT URETEROSCOPY WITH HOLMIUM LASER RIGHT STENT PLACEMENT;  Surgeon: Irine Seal, MD;  Location: [facility];  Service: Urology;  Laterality: Right;   EXTRACORPOREAL SHOCK WAVE LITHOTRIPSY Right 08/22/2017   Procedure: RIGHT EXTRACORPOREAL SHOCK WAVE LITHOTRIPSY (ESWL);  Surgeon: Alexis Frock, MD;  Location: [facility];  Service: Urology;  Laterality: Right;   HEMORRHOID BANDING  07/2012   Dr. Oneida Alar   LAPAROSCOPIC CHOLECYSTECTOMY  1985   LAPAROSCOPIC GASTRIC BANDING  12/12/2010   LEFT AND RIGHT HEART CATHETERIZATION WITH CORONARY ANGIOGRAM N/A 06/03/2012   Procedure: LEFT AND RIGHT HEART CATHETERIZATION WITH CORONARY ANGIOGRAM;  Surgeon: Jolaine Artist, MD;  Location[facility] CATH LAB;  Service: Cardiovascular;  Laterality: N/A;   PILONIDAL CYST EXCISION  1974   RIGHT HEART CATH N/A 12/20/2022   Procedure: RIGHT HEART CATH;  Surgeon: Jolaine Artist, MD;  Location: [place] CV LAB;  Service: Cardiovascular;  Laterality: N/A;   RIGHT/LEFT HEART CATH AND CORONARY ANGIOGRAPHY N/A  03/28/2020   Procedure: RIGHT/LEFT HEART CATH AND CORONARY ANGIOGRAPHY;  Surgeon: Jolaine Artist, MD;  Location: [place] CV LAB;  Service: Cardiovascular;  Laterality: N/A;   TUMOR EXCISION  10/2011   Left thigh   TUMOR EXCISION  10/2011   Face     reports that she quit smoking about 26 years ago. Her smoking use included cigarettes. She has never used smokeless tobacco. She reports that she does not drink alcohol and does not use drugs.  Allergies  Allergen Reactions   Ancef [Cefazolin] Anaphylaxis and Shortness Of Breath        Bee Venom Anaphylaxis    As a child   Meloxicam     Gi upset    Nsaids Other (See Comments)    Rectal bleeding, stopped heart    Ceftin [Cefuroxime Axetil] Swelling  Swollen tongue   Chicken Meat (Diagnostic) Nausea And Vomiting   Ciprofloxacin Cough    Coughed up blood   Macrodantin [Nitrofurantoin] Other (See Comments)    Vomited blood and had dark stool.   Meat [Alpha-Gal] Nausea And Vomiting   Other Other (See Comments)    Snake venom - Scaly Scalp, SOB, Night Terrors   Latex Itching and Rash   Milk-Related Compounds Other (See Comments)    IBS    Family History  Problem Relation Age of Onset   Cancer Paternal Grandfather    Hypertension Paternal Grandmother    Stroke Paternal Grandmother    Other Maternal Grandmother        tonsilitis   Diabetes Maternal Grandfather    Hypertension Maternal Grandfather    CAD Father    Hypertension Father    Heart failure Father    Kidney disease Father    Diabetes Mother    Heart failure Mother    Breast cancer Sister    Breast cancer Paternal Aunt     Prior to Admission medications   Medication Sig Start Date End Date Taking? Authorizing Provider  albuterol (PROVENTIL) (2.5 MG/3ML) 0.083% nebulizer solution Take 2.5 mg by nebulization every 4 (four) hours as needed for wheezing or shortness of breath.  09/19/15   [provider]  albuterol (VENTOLIN HFA) 108 (90 Base) MCG/ACT  inhaler Inhale 2 puffs into the lungs every 6 (six) hours as needed for wheezing or shortness of breath. 10/11/22   Rigoberto Noel, MD  ALPRAZolam Duanne Moron) 0.5 MG tablet Take 0.5 mg by mouth at bedtime as needed for anxiety or sleep. 06/08/20   [provider]  amLODipine (NORVASC) 10 MG tablet Take 10 mg by mouth every evening.  04/10/13   [provider]  Ascorbic Acid (VITAMIN C) 500 MG CAPS Take 500 mg by mouth daily.    [provider]  baclofen (LIORESAL) 10 MG tablet TAKE 1 TO 2 TABLETS FOUR TIMES DAILY 12/20/22   Meredith Staggers, MD  Blood Glucose Calibration (TRUE METRIX LEVEL 2) Normal SOLN  05/30/21   [provider]  Blood Glucose Monitoring Suppl (TRUE METRIX AIR GLUCOSE METER) w/Device KIT  08/07/22   [provider]  Cholecalciferol (DIALYVITE VITAMIN D 5000) 125 MCG (5000 UT) capsule Take 5,000 Units by mouth once a week.    [provider]  clobetasol (TEMOVATE) 0.05 % external solution Apply 1 application  topically 2 (two) times daily. 04/15/19   [provider]  Cyanocobalamin (B-12 PO) Take 1 mL by mouth daily.    [provider]  EPINEPHrine 0.3 mg/0.3 mL IJ SOAJ injection Inject 0.3 mg into the muscle as needed for anaphylaxis. 05/27/19   [provider]  fenofibrate 160 MG tablet Take 160 mg by mouth daily. 05/30/21   [provider]  fluticasone (FLONASE) 50 MCG/ACT nasal spray Place 1 spray into both nostrils daily.  06/07/20   [provider]  furosemide (LASIX) 40 MG tablet Take 40-80 mg by mouth 2 (two) times daily. 06/24/19   [provider]  gabapentin (NEURONTIN) 100 MG capsule Take 2 capsules (200 mg total) by mouth 4 (four) times daily. 11/23/22   Johnson, Clanford L, MD  guaiFENesin-dextromethorphan (ROBITUSSIN DM) 100-10 MG/5ML syrup Take 5 mLs by mouth every 4 (four) hours as needed for cough. Patient not taking: Reported on 12/19/2022 11/23/22   Murlean Iba,  MD  hydrALAZINE (APRESOLINE) 25 MG tablet Take 1 tablet (25 mg  total) by mouth 3 (three) times daily. Patient not taking: Reported on 12/19/2022 12/14/22   Rafael Bihari, FNP  isosorbide mononitrate (IMDUR) 30 MG 24 hr tablet Take 30 mg by mouth daily.  09/05/11   [provider]  levocetirizine (XYZAL) 5 MG tablet Take 5 mg by mouth at bedtime.    [provider]  linaclotide Rolan Lipa) 72 MCG capsule Take 1 capsule (72 mcg total) by mouth daily before breakfast. 05/31/22 05/31/23  Eloise Harman, DO  losartan-hydrochlorothiazide (HYZAAR) 100-25 MG tablet Take 1 tablet by mouth daily.    [provider]  magnesium oxide (MAG-OX) 400 (240 Mg) MG tablet Take 400 mg by mouth daily.    [provider]  Multiple Vitamin (MULTIVITAMIN WITH MINERALS) TABS tablet Take 1 tablet by mouth daily.    [provider]  nitroGLYCERIN (NITROSTAT) 0.4 MG SL tablet Place 0.4 mg under the tongue every 5 (five) minutes as needed for chest pain.    [provider]  nystatin ointment (MYCOSTATIN) Apply 1 Application topically 2 (two) times daily for 14 days. 12/19/22 01/02/23  Janyth Pupa, DO  ondansetron (ZOFRAN) 4 MG tablet Take 4 mg by mouth every 8 (eight) hours as needed for nausea or vomiting.  11/05/19   [provider]  oxybutynin (DITROPAN-XL) 10 MG 24 hr tablet Take 1 tablet (10 mg total) by mouth daily as needed. 11/23/22   Johnson, Clanford L, MD  Oxycodone HCl 10 MG TABS Take 10 mg by mouth every 6 (six) hours as needed (pain). 10/12/22   [provider]  potassium chloride SA (K-DUR) 20 MEQ tablet Take 20 mEq by mouth daily.  06/12/19   [provider]  pravastatin (PRAVACHOL) 40 MG tablet Take 40 mg by mouth daily. 09/11/21   [provider]  predniSONE (DELTASONE) 10 MG tablet Take 1 tablet (10 mg total) by mouth daily with breakfast. Patient not taking: Reported on 12/19/2022 12/12/22   Rigoberto Noel, MD  RESTASIS  0.05 % ophthalmic emulsion Place 1 drop into both eyes 2 (two) times daily. 08/27/22   [provider]  SYMBICORT 160-4.5 MCG/ACT inhaler Inhale 2 puffs into the lungs 2 (two) times daily. 07/24/21   Rigoberto Noel, MD  TRUE METRIX BLOOD GLUCOSE TEST test strip  05/30/21   [provider]  TRUEplus Lancets 33G MISC  05/30/21   [provider]  TRULICITY 1.5 SE/3.9RV SOPN Inject 1.5 mg into the skin every 7 (seven) days. 01/03/19   [provider]    Physical Exam: Vitals:   12/23/22 1500 12/23/22 1600 12/23/22 1630 12/23/22 1637  BP: (!) 145/61 (!) 158/76 (!) 158/74   Pulse: 83 83 85   Resp: _0 Temp:    98.4 F (36.9 C)  TempSrc:    Oral  SpO2: 97% 97% 100%   Weight:      Height:        Constitutional: NAD, calm, comfortable Vitals:   12/23/22 1500 12/23/22 1600 12/23/22 1630 12/23/22 1637  BP: (!) 145/61 (!) 158/76 (!) 158/74   Pulse: 83 83 85   Resp: _1 Temp:    98.4 F (36.9 C)  TempSrc:    Oral  SpO2: 97% 97% 100%   Weight:      Height:       Eyes: PERRL, lids and conjunctivae normal ENMT: Mucous membranes are moist.   Neck: normal, supple, no masses, no thyromegaly Respiratory: clear to  auscultation bilaterally, no wheezing, no crackles. Normal respiratory effort. No accessory muscle use.  Cardiovascular: Regular rate and rhythm, no murmurs / rubs / gallops.  1+ pitting bilateral lower extremity edema to knees, worse on the right  Abdomen: obese, no tenderness, no masses palpated. No hepatosplenomegaly. Bowel sounds positive.  Erythema to left groin underneath large abdominal pannus Musculoskeletal: no clubbing / cyanosis. No joint deformity upper and lower extremities.  Skin: no rashes, lesions, ulcers. No induration Neurologic: No apparent cranial nerve abnormality, moving extremities spontaneously.Marland Kitchen  Psychiatric: Normal judgment and insight. Alert and oriented x 3. Normal mood.   Labs on Admission: I have personally  reviewed following labs and imaging studies  CBC: Recent Labs  Lab 12/20/22 1337 12/23/22 1300  WBC  --  14.9*  NEUTROABS  --  13.6*  HGB 11.2*  11.9* 11.5*  HCT 33.0*  35.0* 35.7*  MCV  --  102.0*  PLT  --  109   Basic Metabolic Panel: Recent Labs  Lab 12/20/22 1337 12/23/22 1300  NA 147*  144 143  K 3.6  4.0 4.0  CL  --  109  CO2  --  24  GLUCOSE  --  186*  BUN  --  32*  CREATININE  --  2.00*  CALCIUM  --  9.2   GFR: Estimated Creatinine Clearance: 33.2 mL/min (A) (by C-G formula based on SCr of 2 mg/dL (H)). Liver Function Tests: Recent Labs  Lab 12/23/22 1300  AST 24  ALT 16  ALKPHOS 45  BILITOT 0.4  PROT 7.5  ALBUMIN 3.8   No results for input(s): "LIPASE", "AMYLASE" in the last 168 hours. No results for input(s): "AMMONIA" in the last 168 hours. Coagulation Profile: Recent Labs  Lab 12/23/22 1410  INR 1.1   CBG: Recent Labs  Lab 12/20/22 1014  GLUCAP 120*   Urine analysis:    Component Value Date/Time   COLORURINE STRAW (A) 12/23/2022 1245   APPEARANCEUR CLEAR 12/23/2022 1245   APPEARANCEUR Clear 06/07/2022 1149   LABSPEC 1.009 12/23/2022 1245   PHURINE 5.0 12/23/2022 1245   GLUCOSEU NEGATIVE 12/23/2022 1245   HGBUR SMALL (A) 12/23/2022 1245   BILIRUBINUR NEGATIVE 12/23/2022 1245   BILIRUBINUR Negative 06/07/2022 1149   KETONESUR NEGATIVE 12/23/2022 1245   PROTEINUR NEGATIVE 12/23/2022 1245   UROBILINOGEN 0.2 06/03/2020 1150   UROBILINOGEN 0.2 09/09/2013 1911   NITRITE NEGATIVE 12/23/2022 1245   LEUKOCYTESUR NEGATIVE 12/23/2022 1245    Radiological Exams on Admission: CT Chest Wo Contrast  Result Date: 12/23/2022 CLINICAL DATA:  Respiratory illness. Sepsis. Chest pain and shortness of breath since this morning. EXAM: CT CHEST, ABDOMEN AND PELVIS WITHOUT CONTRAST TECHNIQUE: Multidetector CT imaging of the chest, abdomen and pelvis was performed following the standard protocol without IV contrast. RADIATION DOSE REDUCTION: This  exam was performed according to the departmental dose-optimization program which includes automated exposure control, adjustment of the mA and/or kV according to patient size and/or use of iterative reconstruction technique. COMPARISON:  06/28/2022. FINDINGS: CT CHEST FINDINGS Cardiovascular: Heart normal in size and configuration. No pericardial effusion. Minimal left coronary artery calcifications. Great vessels are normal in caliber. Mild aortic atherosclerotic calcifications. Mediastinum/Nodes: No enlarged mediastinal, hilar, or axillary lymph nodes. Thyroid gland, trachea, and esophagus demonstrate no significant findings. Lungs/Pleura: Lungs are clear. No pleural effusion or pneumothorax. Musculoskeletal: No fracture or acute finding. No bone lesion. No chest wall mass. CT ABDOMEN PELVIS FINDINGS Hepatobiliary: No focal liver abnormality is seen. Status post cholecystectomy. No biliary dilatation. Pancreas:  Unremarkable. No pancreatic ductal dilatation or surrounding inflammatory changes. Spleen: Normal in size without focal abnormality. Adrenals/Urinary Tract: Normal adrenal glands. 4 mm stone, lower pole the right kidney. No renal masses, other stones or hydronephrosis. Ureters are normal in course and in caliber. Bladder is unremarkable. Stomach/Bowel: Well positioned lap band at the gastroesophageal junction. This is stable. Stomach unremarkable. Small bowel and colon are normal in caliber. No wall thickening. No inflammation. Vascular/Lymphatic: Aortic atherosclerotic calcifications. No aneurysm. No enlarged lymph nodes. Reproductive: Densely calcified exophytic fibroid extends toward the left adnexa from the left upper uterine segment, 6 cm in size, unchanged. Smaller uterine calcifications suggest additional small fibroids. No adnexal masses. Other: No abdominal wall hernia or abnormality. No abdominopelvic ascites. Musculoskeletal: No fracture or acute finding.  No bone lesion. IMPRESSION: 1. No acute  findings within the chest, abdomen or pelvis. No evidence of infection. Findings stable from the prior abdomen and pelvis CT from 06/28/2022. 2. Aortic atherosclerosis. 3. Nonobstructing stone in the lower pole the right kidney. 4. Calcified uterine fibroids. Electronically Signed   By: Lajean Manes M.D.   On: 12/23/2022 16:05   CT ABDOMEN PELVIS WO CONTRAST  Result Date: 12/23/2022 CLINICAL DATA:  Respiratory illness. Sepsis. Chest pain and shortness of breath since this morning. EXAM: CT CHEST, ABDOMEN AND PELVIS WITHOUT CONTRAST TECHNIQUE: Multidetector CT imaging of the chest, abdomen and pelvis was performed following the standard protocol without IV contrast. RADIATION DOSE REDUCTION: This exam was performed according to the departmental dose-optimization program which includes automated exposure control, adjustment of the mA and/or kV according to patient size and/or use of iterative reconstruction technique. COMPARISON:  06/28/2022. FINDINGS: CT CHEST FINDINGS Cardiovascular: Heart normal in size and configuration. No pericardial effusion. Minimal left coronary artery calcifications. Great vessels are normal in caliber. Mild aortic atherosclerotic calcifications. Mediastinum/Nodes: No enlarged mediastinal, hilar, or axillary lymph nodes. Thyroid gland, trachea, and esophagus demonstrate no significant findings. Lungs/Pleura: Lungs are clear. No pleural effusion or pneumothorax. Musculoskeletal: No fracture or acute finding. No bone lesion. No chest wall mass. CT ABDOMEN PELVIS FINDINGS Hepatobiliary: No focal liver abnormality is seen. Status post cholecystectomy. No biliary dilatation. Pancreas: Unremarkable. No pancreatic ductal dilatation or surrounding inflammatory changes. Spleen: Normal in size without focal abnormality. Adrenals/Urinary Tract: Normal adrenal glands. 4 mm stone, lower pole the right kidney. No renal masses, other stones or hydronephrosis. Ureters are normal in course and in  caliber. Bladder is unremarkable. Stomach/Bowel: Well positioned lap band at the gastroesophageal junction. This is stable. Stomach unremarkable. Small bowel and colon are normal in caliber. No wall thickening. No inflammation. Vascular/Lymphatic: Aortic atherosclerotic calcifications. No aneurysm. No enlarged lymph nodes. Reproductive: Densely calcified exophytic fibroid extends toward the left adnexa from the left upper uterine segment, 6 cm in size, unchanged. Smaller uterine calcifications suggest additional small fibroids. No adnexal masses. Other: No abdominal wall hernia or abnormality. No abdominopelvic ascites. Musculoskeletal: No fracture or acute finding.  No bone lesion. IMPRESSION: 1. No acute findings within the chest, abdomen or pelvis. No evidence of infection. Findings stable from the prior abdomen and pelvis CT from 06/28/2022. 2. Aortic atherosclerosis. 3. Nonobstructing stone in the lower pole the right kidney. 4. Calcified uterine fibroids. Electronically Signed   By: Lajean Manes M.D.   On: 12/23/2022 16:05   DG Chest Port 1 View  Result Date: 12/23/2022 CLINICAL DATA:  Shortness of breath and chest pain EXAM: PORTABLE CHEST 1 VIEW COMPARISON:  11/20/2019 FINDINGS: Cardiac enlargement and aortic atherosclerosis. Low lung volumes.  The left costophrenic angle is ups cured in there may be a small left effusion. Subsegmental atelectasis noted in the right base. No frank interstitial edema. IMPRESSION: 1. Suspect small left pleural effusion. 2. Low lung volumes. Electronically Signed   By: Kerby Moors M.D.   On: 12/23/2022 13:23    EKG: Independently reviewed.  Sinus rhythm, rate 102, QTc 398.  No significant change from prior.  Assessment/Plan Principal Problem:   Chest pain Active Problems:   Acute on chronic diastolic CHF (congestive heart failure) (HCC)   OSA on CPAP   Morbid obesity (HCC)   Diabetes mellitus ([place])   Pulmonary hypertension due to sleep-disordered breathing  (HCC)   Essential hypertension   Leukocytosis   Assessment and Plan: * Chest pain Chest pain with dizziness and dyspnea.  Ongoing for several years.  Troponin 10 >. 11, EKG without significant change.  Underwent left and right heart cath 2021, with findings of normal coronary arteries. Recent Right heart cath 12/23- Mild Pulm HTN with mild elevated PVR.   - Per notes chronic chest pains-since at least 02/2020.   -Resume Imdur -Nitro as needed  Acute on chronic diastolic CHF (congestive heart failure) (HCC) Reports 7 pound weight gain, has bilateral pitting lower extremity edema.  Per charts with since stable from recent discharge weight- 245, and 247 today.  BNP elevated at 65 compared to recent- 27.  CT chest without contrast negative for acute abnormality.  Not compliant with prescribed dose of Lasix. -Recent echo- 10/2022- EF 60 to 65%. -IV Lasix 40 mg x 2, further doses pending clinical course -Daily BMP -Strict input output, daily weights  Leukocytosis Leukocytosis of 14.9.  Initial heart rate of 105, has since resolved.  Lactic acid of 3 > 2.1, after 1.6 L bolus given.  CT chest abdomen and pelvis without acute abnormality.  UA not suggestive of infection.  At this time no focus of infection identified. -Follow-up blood cultures -Defer further antibiotics -Check procalcitonin  Essential hypertension Stable. -Resume Norvasc, hydralazine, Imdur, losartan HCTZ  Diabetes mellitus (HCC) Controlled A1c 6.1. - SSI_ M -Hold Trulicity   DVT prophylaxis:  Heparin Code Status: Full code. Family Communication: None at bedside Disposition Plan: ~ 2 days Consults called: none Admission status:  obs tele    Author: Bethena Roys, MD 12/23/2022 7:48 PM  For on call review [email or web address].

## 2022-12-23 NOTE — Progress Notes (Signed)
Pharmacy Antibiotic Note  Sara Tran is a 66 y.o. female admitted on 12/23/2022 with sepsis.  Pharmacy has been consulted for aztreonam and vancomycin dosing.  Presenting with SOB and CP since this morning. WBC 14.9, LA 3, afebrile. Scr 2 (CrCl 33 mL/min). Has allergies listed for cephalosporins (anaphylaxis) - had received ampicillin and augmentin but also at time of allergy being entered so will continue with aztreonam.   Plan: Aztreonam 1g IV every 8 hours  Vancomycin 2g IV once then 1250 mg IV every 48 hours (estAUC 489, Vd 0.5) Monitor renal fx, cx results, clinical pic, vanc level as appropriate    Height: '5\' 2"'$  (157.5 cm) Weight: 112 kg (247 lb) IBW/kg (Calculated) : 50.1  Temp (24hrs), Avg:98.4 F (36.9 C), Min:98.4 F (36.9 C), Max:98.4 F (36.9 C)  Recent Labs  Lab 12/23/22 1300 12/23/22 1410  WBC 14.9*  --   CREATININE 2.00*  --   LATICACIDVEN  --  3.0*    Estimated Creatinine Clearance: 33.2 mL/min (A) (by C-G formula based on SCr of 2 mg/dL (H)).    Allergies  Allergen Reactions   Ancef [Cefazolin] Anaphylaxis and Shortness Of Breath        Bee Venom Anaphylaxis    As a child   Meloxicam     Gi upset    Nsaids Other (See Comments)    Rectal bleeding, stopped heart    Ceftin [Cefuroxime Axetil] Swelling    Swollen tongue   Chicken Meat (Diagnostic) Nausea And Vomiting   Ciprofloxacin Cough    Coughed up blood   Macrodantin [Nitrofurantoin] Other (See Comments)    Vomited blood and had dark stool.   Meat [Alpha-Gal] Nausea And Vomiting   Other Other (See Comments)    Snake venom - Scaly Scalp, SOB, Night Terrors   Latex Itching and Rash   Milk-Related Compounds Other (See Comments)    IBS    Antimicrobials this admission: Vancomycin 12/24 >>  Aztreonam 12/24 >>   Dose adjustments this admission: N/A  Microbiology results: 12/24 BCx: ngtd   Thank you for allowing pharmacy to be a part of this patient's care.  Antonietta Jewel, PharmD,  [place] Clinical Pharmacist  Phone: [phone] 12/23/2022 4:24 PM  Please check AMION for all [place] phone numbers After 10:00 PM, call [place] [phone]

## 2022-12-23 NOTE — ED Triage Notes (Signed)
Pt presents with CP and SOB since this am, history of CHF.

## 2022-12-23 NOTE — Progress Notes (Signed)
Dr. Clearence Ped contacted related to bed bugs found on patient at the time of transfer from ED. Patient to be placed on Contact Isolation.

## 2022-12-23 NOTE — Assessment & Plan Note (Addendum)
Chest pain with dizziness and dyspnea.  Ongoing for several years.  Troponin 10 >. 11, EKG without significant change.  Underwent left and right heart cath 2021, with findings of normal coronary arteries. Recent Right heart cath 12/23- Mild Pulm HTN with mild elevated PVR.   - Per notes chronic chest pains-since at least 02/2020.   -Resumed Imdur -Nitro as needed

## 2022-12-24 DIAGNOSIS — I2729 Other secondary pulmonary hypertension: Secondary | ICD-10-CM | POA: Diagnosis not present

## 2022-12-24 DIAGNOSIS — R072 Precordial pain: Secondary | ICD-10-CM | POA: Diagnosis not present

## 2022-12-24 DIAGNOSIS — I5033 Acute on chronic diastolic (congestive) heart failure: Secondary | ICD-10-CM | POA: Diagnosis not present

## 2022-12-24 DIAGNOSIS — G478 Other sleep disorders: Secondary | ICD-10-CM

## 2022-12-24 DIAGNOSIS — I10 Essential (primary) hypertension: Secondary | ICD-10-CM

## 2022-12-24 DIAGNOSIS — G4733 Obstructive sleep apnea (adult) (pediatric): Secondary | ICD-10-CM | POA: Diagnosis not present

## 2022-12-24 LAB — BASIC METABOLIC PANEL
Anion gap: 9 (ref 5–15)
BUN: 29 mg/dL — ABNORMAL HIGH (ref 8–23)
CO2: 26 mmol/L (ref 22–32)
Calcium: 9.1 mg/dL (ref 8.9–10.3)
Chloride: 110 mmol/L (ref 98–111)
Creatinine, Ser: 1.76 mg/dL — ABNORMAL HIGH (ref 0.44–1.00)
GFR, Estimated: 32 mL/min — ABNORMAL LOW (ref >60)
Glucose, Bld: 114 mg/dL — ABNORMAL HIGH (ref 70–99)
Potassium: 3.4 mmol/L — ABNORMAL LOW (ref 3.5–5.1)
Sodium: 145 mmol/L (ref 135–145)

## 2022-12-24 LAB — CBC
HCT: 32.8 % — ABNORMAL LOW (ref 36.0–46.0)
Hemoglobin: 10.8 g/dL — ABNORMAL LOW (ref 12.0–15.0)
MCH: 32.9 pg (ref 26.0–34.0)
MCHC: 32.9 g/dL (ref 30.0–36.0)
MCV: 100 fL (ref 80.0–100.0)
Platelets: 254 10*3/uL (ref 150–400)
RBC: 3.28 MIL/uL — ABNORMAL LOW (ref 3.87–5.11)
RDW: 12.8 % (ref 11.5–15.5)
WBC: 12.1 10*3/uL — ABNORMAL HIGH (ref 4.0–10.5)
nRBC: 0 % (ref 0.0–0.2)

## 2022-12-24 LAB — GLUCOSE, CAPILLARY
Glucose-Capillary: 89 mg/dL (ref 70–99)
Glucose-Capillary: 123 mg/dL — ABNORMAL HIGH (ref 70–99)
Glucose-Capillary: 101 mg/dL — ABNORMAL HIGH (ref 70–99)
Glucose-Capillary: 124 mg/dL — ABNORMAL HIGH (ref 70–99)

## 2022-12-24 LAB — PROCALCITONIN: Procalcitonin: <0.1 ng/mL

## 2022-12-24 MED ORDER — LINACLOTIDE 72 MCG PO CAPS
72.0000 ug | ORAL_CAPSULE | Freq: Every day | ORAL | Status: DC
  Administered 2022-12-25: 72 ug via ORAL
  Filled 2022-12-24 (×4): qty 1

## 2022-12-24 MED ORDER — FENOFIBRATE 160 MG PO TABS
160.0000 mg | ORAL_TABLET | Freq: Every day | ORAL | Status: DC
  Administered 2022-12-24 – 2022-12-26 (×3): 160 mg via ORAL
  Filled 2022-12-24 (×3): qty 1

## 2022-12-24 MED ORDER — ALPRAZOLAM 0.5 MG PO TABS
0.5000 mg | ORAL_TABLET | Freq: Once | ORAL | Status: AC
  Administered 2022-12-24: 0.5 mg via ORAL
  Filled 2022-12-24: qty 1

## 2022-12-24 MED ORDER — PRAVASTATIN SODIUM 40 MG PO TABS
40.0000 mg | ORAL_TABLET | Freq: Every day | ORAL | Status: DC
  Administered 2022-12-24 – 2022-12-25 (×2): 40 mg via ORAL
  Filled 2022-12-24 (×2): qty 1

## 2022-12-24 MED ORDER — OXYCODONE HCL 5 MG PO TABS
10.0000 mg | ORAL_TABLET | Freq: Four times a day (QID) | ORAL | Status: DC | PRN
  Administered 2022-12-24 – 2022-12-25 (×2): 10 mg via ORAL
  Filled 2022-12-24 (×2): qty 2

## 2022-12-24 MED ORDER — BACLOFEN 10 MG PO TABS
10.0000 mg | ORAL_TABLET | Freq: Three times a day (TID) | ORAL | Status: DC | PRN
  Administered 2022-12-24: 10 mg via ORAL
  Filled 2022-12-24: qty 1

## 2022-12-24 MED ORDER — CYCLOSPORINE 0.05 % OP EMUL
1.0000 [drp] | Freq: Two times a day (BID) | OPHTHALMIC | Status: DC
  Administered 2022-12-24 – 2022-12-26 (×4): 1 [drp] via OPHTHALMIC
  Filled 2022-12-24 (×4): qty 30

## 2022-12-24 MED ORDER — AMLODIPINE BESYLATE 5 MG PO TABS
10.0000 mg | ORAL_TABLET | Freq: Every evening | ORAL | Status: DC
  Administered 2022-12-24 – 2022-12-25 (×2): 10 mg via ORAL
  Filled 2022-12-24 (×2): qty 2

## 2022-12-24 MED ORDER — POTASSIUM CHLORIDE CRYS ER 20 MEQ PO TBCR
40.0000 meq | EXTENDED_RELEASE_TABLET | Freq: Once | ORAL | Status: AC
  Administered 2022-12-24: 40 meq via ORAL
  Filled 2022-12-24: qty 2

## 2022-12-24 MED ORDER — ALPRAZOLAM 0.5 MG PO TABS
0.5000 mg | ORAL_TABLET | Freq: Every evening | ORAL | Status: DC | PRN
  Filled 2022-12-24: qty 1

## 2022-12-24 MED ORDER — LIVING BETTER WITH HEART FAILURE BOOK: Freq: Once | Status: DC

## 2022-12-24 MED ORDER — OXYBUTYNIN CHLORIDE ER 5 MG PO TB24: 10.0000 mg | ORAL_TABLET | Freq: Every day | ORAL | Status: DC | PRN

## 2022-12-24 MED ORDER — GUAIFENESIN ER 600 MG PO TB12
1200.0000 mg | ORAL_TABLET | Freq: Two times a day (BID) | ORAL | Status: DC
  Administered 2022-12-24 – 2022-12-26 (×5): 1200 mg via ORAL
  Filled 2022-12-24 (×6): qty 2

## 2022-12-24 MED ORDER — GABAPENTIN 100 MG PO CAPS
200.0000 mg | ORAL_CAPSULE | Freq: Four times a day (QID) | ORAL | Status: DC
  Administered 2022-12-24 – 2022-12-26 (×7): 200 mg via ORAL
  Filled 2022-12-24 (×7): qty 2

## 2022-12-24 MED ORDER — VITAMIN C 500 MG PO TABS
500.0000 mg | ORAL_TABLET | Freq: Every day | ORAL | Status: DC
  Administered 2022-12-24 – 2022-12-26 (×3): 500 mg via ORAL
  Filled 2022-12-24 (×3): qty 1

## 2022-12-24 MED ORDER — DEXTROMETHORPHAN POLISTIREX ER 30 MG/5ML PO SUER
30.0000 mg | Freq: Two times a day (BID) | ORAL | Status: DC | PRN
  Administered 2022-12-24: 30 mg via ORAL
  Filled 2022-12-24: qty 5

## 2022-12-24 MED ORDER — POTASSIUM CHLORIDE CRYS ER 20 MEQ PO TBCR
20.0000 meq | EXTENDED_RELEASE_TABLET | Freq: Every day | ORAL | Status: DC
  Administered 2022-12-24 – 2022-12-26 (×3): 20 meq via ORAL
  Filled 2022-12-24 (×3): qty 1

## 2022-12-24 MED ORDER — FLUTICASONE PROPIONATE 50 MCG/ACT NA SUSP
1.0000 | Freq: Every day | NASAL | Status: DC
  Administered 2022-12-24 – 2022-12-26 (×3): 1 via NASAL
  Filled 2022-12-24: qty 16

## 2022-12-24 NOTE — Progress Notes (Signed)
PROGRESS NOTE   Sara Tran  HCW:[id number] DOB: [birth year] DOA: 12/23/2022 PCP: Sharilyn Sites, MD   Chief Complaint  Patient presents with   Chest Pain   Shortness of Breath   Level of care: Telemetry  Brief Admission History:   66 y.o. female with medical history significant for pulmonary hypertension, asthma and OSA, diabetes mellitus, CKD 4, diastolic CHF, atrial fibrillation, coronary artery disease. Patient presented to the ED with complaints of chest pain and difficulty breathing.  She reports chest pain has been ongoing over the past several years- ? 72yr, intermittent, left-sided radiating across to the right, described as pressure-like, sharp, provoked by activity and resolves with rest.  Today she was getting ready for church when chest pain started.  Later while she was in church she started having difficulty breathing, she was dizzy while standing and would have fallen but she reached out to hold the pew.  Reports she checks her weight daily, she reports 7 pound weight gain over the past several days.,  And that her normal weight is about 241 pounds.  Reports bilateral lower extremity swelling worse than baseline, and abdominal bloating.,  No cough, no fevers. She reports she is mostly compliant with 40 mg of Lasix once daily, but per med list she is supposed to be on 40 to 80 mg twice daily.   Recent hospitalization 11/20 to 11/24, for decompensated CHF, AKI on CKD, COPD exacerbation and hyperkalemia.   In the interim, patient also underwent endometrial biopsy 12/20, with findings of well-differentiated endometrioid Adenocarcinoma.  She is been referred to GYN/ONC Also followed up with Dr. BHaroldine Lawsfor right heart cath done 12/21-impression mild pulmonary hypertension with mildly elevated PVR.     Assessment and Plan: * Chest pain Chest pain with dizziness and dyspnea.  Ongoing for several years.  Troponin 10 >. 11, EKG without significant change.  Underwent left and right  heart cath 2021, with findings of normal coronary arteries. Recent Right heart cath 12/23- Mild Pulm HTN with mild elevated PVR.   - Per notes chronic chest pains-since at least 02/2020.   -Resume Imdur -Nitro as needed  Leukocytosis Leukocytosis of 14.9.  Initial heart rate of 105, has since resolved.  Lactic acid of 3 > 2.1, after 1.6 L bolus given.  CT chest abdomen and pelvis without acute abnormality.  UA not suggestive of infection.  At this time no focus of infection identified. -Follow-up blood cultures -Defer further antibiotics -procalcitonin <0.10 which is reassuring   Essential hypertension Stable. 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 BHaroldine Laws  Diabetes mellitus ([place] Controlled A1c 6.1. - SSI - M -Hold Trulicity CBG (last 3)  Recent Labs    12/23/22 2335 12/24/22 0817  GLUCAP 83 89     Morbid obesity ([facility] --Pt eval in AM requested   OSA on CPAP -nightly CPAP ordered   Acute on chronic diastolic CHF (congestive heart failure) (HCC) Reports 7 pound weight gain, has bilateral pitting lower extremity edema.  Per charts with since stable from recent discharge weight- 245, and 247 today.  BNP elevated at 65 compared to recent- 27.  CT chest without contrast negative for acute abnormality.  Not compliant with prescribed dose of Lasix. -Recent echo- 10/2022- EF 60 to 65%. -IV Lasix 40 mg x 2, further doses pending clinical course -Daily BMP -Strict input output, daily weights -I have requested a redsVest reading since was not  done on admission   DVT prophylaxis: [place] heparin Code Status: full  Family Communication:  Disposition: Status is: Observation The patient remains OBS appropriate and will d/c before 2 midnights.   Consultants:   Procedures:   Antimicrobials:    Subjective: Pt awake, alert, cooperative, c/o coughing and chest congestion.    Objective: Vitals:   12/24/22 0009 12/24/22 0439 12/24/22 0500 12/24/22 0738  BP:  (!) 139/50    Pulse: (!) 59 87    Resp: 16 20    Temp:  98.5 F (36.9 C)    TempSrc:  Oral    SpO2: 96% 98%  98%  Weight:   115 kg   Height:        Intake/Output Summary (Last 24 hours) at 12/24/2022 1140 Last data filed at 12/24/2022 1028 Gross per 24 hour  Intake 1500 ml  Output 3600 ml  Net -2100 ml   Filed Weights   12/23/22 1222 12/23/22 2117 12/24/22 0500  Weight: 112 kg 115.3 kg 115 kg   Examination:  General exam: Appears calm and comfortable  Respiratory system: moderate increased work of breathing. Cardiovascular system: normal S1 & S2 heard. No JVD, murmurs, rubs, gallops or clicks. No pedal edema. Gastrointestinal system: Abdomen is nondistended, soft and nontender. No organomegaly or masses felt. Normal bowel sounds heard. Central nervous system: Alert and oriented. No focal neurological deficits. Extremities: Symmetric 5 x 5 power. Skin: No rashes, lesions or ulcers. Psychiatry: Judgement and insight appear normal. Mood & affect appropriate.   Data Reviewed: I have personally reviewed following labs and imaging studies  CBC: Recent Labs  Lab 12/20/22 1337 12/23/22 1300 12/24/22 0504  WBC  --  14.9* 12.1*  NEUTROABS  --  13.6*  --   HGB 11.2*  11.9* 11.5* 10.8*  HCT 33.0*  35.0* 35.7* 32.8*  MCV  --  102.0* 100.0  PLT  --  289 510    Basic Metabolic Panel: Recent Labs  Lab 12/20/22 1337 12/23/22 1300 12/24/22 0504  NA 147*  144 143 145  K 3.6  4.0 4.0 3.4*  CL  --  109 110  CO2  --  24 26  GLUCOSE  --  186* 114*  BUN  --  32* 29*  CREATININE  --  2.00* 1.76*  CALCIUM  --  9.2 9.1    CBG: Recent Labs  Lab 12/20/22 1014 12/23/22 2335 12/24/22 0817  GLUCAP 120* 83 89    Recent Results (from the past 240 hour(s))  Resp panel by RT-PCR (RSV, Flu A&B, Covid) Anterior Nasal Swab     Status: None   Collection Time: 12/23/22 12:25 PM   Specimen:  Anterior Nasal Swab  Result Value Ref Range Status   SARS Coronavirus 2 by RT PCR NEGATIVE NEGATIVE Final    Comment: (NOTE) SARS-CoV-2 target nucleic acids are NOT DETECTED.  The SARS-CoV-2 RNA is generally detectable in upper respiratory specimens during the acute phase of infection. The lowest concentration of SARS-CoV-2 viral copies this assay can detect is 138 copies/mL. A negative result does not preclude SARS-Cov-2 infection and should not be used as the sole basis for treatment or other patient management decisions. A negative result may occur with  improper specimen collection/handling, submission of specimen other than nasopharyngeal swab, presence of viral mutation(s) within the areas targeted by this assay, and inadequate number of viral copies(<138 copies/mL). A negative result must be combined with clinical observations, patient history, and epidemiological information. The expected result is Negative.  Fact Sheet for Patients:  EntrepreneurPulse.com.au  Fact Sheet for Healthcare Providers:  IncredibleEmployment.be  This test is no t yet approved or cleared by the [place] FDA and  has been authorized for detection and/or diagnosis of SARS-CoV-2 by FDA under an Emergency Use Authorization (EUA). This EUA will remain  in effect (meaning this test can be used) for the duration of the COVID-19 declaration under Section 564(b)(1) of the Act, 21 U.S.C.section 360bbb-3(b)(1), unless the authorization is terminated  or revoked sooner.       Influenza A by PCR NEGATIVE NEGATIVE Final   Influenza B by PCR NEGATIVE NEGATIVE Final    Comment: (NOTE) The Xpert Xpress SARS-CoV-2/FLU/RSV plus assay is intended as an aid in the diagnosis of influenza from Nasopharyngeal swab specimens and should not be used as a sole basis for treatment. Nasal washings and aspirates are unacceptable for Xpert Xpress SARS-CoV-2/FLU/RSV testing.  Fact  Sheet for Patients: EntrepreneurPulse.com.au  Fact Sheet for Healthcare Providers: IncredibleEmployment.be  This test is not yet approved or cleared by the [place] FDA and has been authorized for detection and/or diagnosis of SARS-CoV-2 by FDA under an Emergency Use Authorization (EUA). This EUA will remain in effect (meaning this test can be used) for the duration of the COVID-19 declaration under Section 564(b)(1) of the Act, 21 U.S.C. section 360bbb-3(b)(1), unless the authorization is terminated or revoked.     Resp Syncytial Virus by PCR NEGATIVE NEGATIVE Final    Comment: (NOTE) Fact Sheet for Patients: EntrepreneurPulse.com.au  Fact Sheet for Healthcare Providers: IncredibleEmployment.be  This test is not yet approved or cleared by the [place] FDA and has been authorized for detection and/or diagnosis of SARS-CoV-2 by FDA under an Emergency Use Authorization (EUA). This EUA will remain in effect (meaning this test can be used) for the duration of the COVID-19 declaration under Section 564(b)(1) of the Act, 21 U.S.C. section 360bbb-3(b)(1), unless the authorization is terminated or revoked.  Performed at [facility], [address] [place], [place] [address]   Blood Culture (routine x 2)     Status: None (Preliminary result)   Collection Time: 12/23/22  2:10 PM   Specimen: BLOOD RIGHT ARM  Result Value Ref Range Status   Specimen Description BLOOD RIGHT ARM  Final   Special Requests   Final    BOTTLES DRAWN AEROBIC AND ANAEROBIC Blood Culture results may not be optimal due to an excessive volume of blood received in culture bottles   Culture   Final    NO GROWTH < 12 HOURS Performed at [facility], [address], [place], [place] [address]    Report Status PENDING  Incomplete  Blood Culture (routine x 2)     Status: None (Preliminary result)   Collection Time: 12/23/22  2:10 PM    Specimen: BLOOD LEFT ARM  Result Value Ref Range Status   Specimen Description BLOOD LEFT ARM  Final   Special Requests   Final    BOTTLES DRAWN AEROBIC AND ANAEROBIC Blood Culture results may not be optimal due to an excessive volume of blood received in culture bottles   Culture   Final    NO GROWTH < 12 HOURS Performed at [facility], [address], [place], [place] [address]    Report Status PENDING  Incomplete     Radiology Studies: CT Chest Wo Contrast  Result Date: 12/23/2022 CLINICAL DATA:  Respiratory illness. Sepsis. Chest pain and shortness of breath since this morning. EXAM: CT CHEST, ABDOMEN AND  PELVIS WITHOUT CONTRAST TECHNIQUE: Multidetector CT imaging of the chest, abdomen and pelvis was performed following the standard protocol without IV contrast. RADIATION DOSE REDUCTION: This exam was performed according to the departmental dose-optimization program which includes automated exposure control, adjustment of the mA and/or kV according to patient size and/or use of iterative reconstruction technique. COMPARISON:  06/28/2022. FINDINGS: CT CHEST FINDINGS Cardiovascular: Heart normal in size and configuration. No pericardial effusion. Minimal left coronary artery calcifications. Great vessels are normal in caliber. Mild aortic atherosclerotic calcifications. Mediastinum/Nodes: No enlarged mediastinal, hilar, or axillary lymph nodes. Thyroid gland, trachea, and esophagus demonstrate no significant findings. Lungs/Pleura: Lungs are clear. No pleural effusion or pneumothorax. Musculoskeletal: No fracture or acute finding. No bone lesion. No chest wall mass. CT ABDOMEN PELVIS FINDINGS Hepatobiliary: No focal liver abnormality is seen. Status post cholecystectomy. No biliary dilatation. Pancreas: Unremarkable. No pancreatic ductal dilatation or surrounding inflammatory changes. Spleen: Normal in size without focal abnormality. Adrenals/Urinary Tract: Normal adrenal glands. 4 mm stone, lower  pole the right kidney. No renal masses, other stones or hydronephrosis. Ureters are normal in course and in caliber. Bladder is unremarkable. Stomach/Bowel: Well positioned lap band at the gastroesophageal junction. This is stable. Stomach unremarkable. Small bowel and colon are normal in caliber. No wall thickening. No inflammation. Vascular/Lymphatic: Aortic atherosclerotic calcifications. No aneurysm. No enlarged lymph nodes. Reproductive: Densely calcified exophytic fibroid extends toward the left adnexa from the left upper uterine segment, 6 cm in size, unchanged. Smaller uterine calcifications suggest additional small fibroids. No adnexal masses. Other: No abdominal wall hernia or abnormality. No abdominopelvic ascites. Musculoskeletal: No fracture or acute finding.  No bone lesion. IMPRESSION: 1. No acute findings within the chest, abdomen or pelvis. No evidence of infection. Findings stable from the prior abdomen and pelvis CT from 06/28/2022. 2. Aortic atherosclerosis. 3. Nonobstructing stone in the lower pole the right kidney. 4. Calcified uterine fibroids. Electronically Signed   By: Lajean Manes M.D.   On: 12/23/2022 16:05   CT ABDOMEN PELVIS WO CONTRAST  Result Date: 12/23/2022 CLINICAL DATA:  Respiratory illness. Sepsis. Chest pain and shortness of breath since this morning. EXAM: CT CHEST, ABDOMEN AND PELVIS WITHOUT CONTRAST TECHNIQUE: Multidetector CT imaging of the chest, abdomen and pelvis was performed following the standard protocol without IV contrast. RADIATION DOSE REDUCTION: This exam was performed according to the departmental dose-optimization program which includes automated exposure control, adjustment of the mA and/or kV according to patient size and/or use of iterative reconstruction technique. COMPARISON:  06/28/2022. FINDINGS: CT CHEST FINDINGS Cardiovascular: Heart normal in size and configuration. No pericardial effusion. Minimal left coronary artery calcifications. Great  vessels are normal in caliber. Mild aortic atherosclerotic calcifications. Mediastinum/Nodes: No enlarged mediastinal, hilar, or axillary lymph nodes. Thyroid gland, trachea, and esophagus demonstrate no significant findings. Lungs/Pleura: Lungs are clear. No pleural effusion or pneumothorax. Musculoskeletal: No fracture or acute finding. No bone lesion. No chest wall mass. CT ABDOMEN PELVIS FINDINGS Hepatobiliary: No focal liver abnormality is seen. Status post cholecystectomy. No biliary dilatation. Pancreas: Unremarkable. No pancreatic ductal dilatation or surrounding inflammatory changes. Spleen: Normal in size without focal abnormality. Adrenals/Urinary Tract: Normal adrenal glands. 4 mm stone, lower pole the right kidney. No renal masses, other stones or hydronephrosis. Ureters are normal in course and in caliber. Bladder is unremarkable. Stomach/Bowel: Well positioned lap band at the gastroesophageal junction. This is stable. Stomach unremarkable. Small bowel and colon are normal in caliber. No wall thickening. No inflammation. Vascular/Lymphatic: Aortic atherosclerotic calcifications. No aneurysm. No enlarged lymph  nodes. Reproductive: Densely calcified exophytic fibroid extends toward the left adnexa from the left upper uterine segment, 6 cm in size, unchanged. Smaller uterine calcifications suggest additional small fibroids. No adnexal masses. Other: No abdominal wall hernia or abnormality. No abdominopelvic ascites. Musculoskeletal: No fracture or acute finding.  No bone lesion. IMPRESSION: 1. No acute findings within the chest, abdomen or pelvis. No evidence of infection. Findings stable from the prior abdomen and pelvis CT from 06/28/2022. 2. Aortic atherosclerosis. 3. Nonobstructing stone in the lower pole the right kidney. 4. Calcified uterine fibroids. Electronically Signed   By: Lajean Manes M.D.   On: 12/23/2022 16:05   DG Chest Port 1 View  Result Date: 12/23/2022 CLINICAL DATA:  Shortness  of breath and chest pain EXAM: PORTABLE CHEST 1 VIEW COMPARISON:  11/20/2019 FINDINGS: Cardiac enlargement and aortic atherosclerosis. Low lung volumes. The left costophrenic angle is ups cured in there may be a small left effusion. Subsegmental atelectasis noted in the right base. No frank interstitial edema. IMPRESSION: 1. Suspect small left pleural effusion. 2. Low lung volumes. Electronically Signed   By: Kerby Moors M.D.   On: 12/23/2022 13:23    Scheduled Meds:  furosemide  40 mg Intravenous Q12H   guaiFENesin  1,200 mg Oral BID   heparin  5,000 Units Subcutaneous Q8H   insulin aspart  0-15 Units Subcutaneous TID WC   insulin aspart  0-5 Units Subcutaneous QHS   isosorbide mononitrate  30 mg Oral Daily   Living Better with Heart Failure Book   Does not apply Once   mometasone-formoterol  2 puff Inhalation BID   nystatin   Topical BID   potassium chloride  40 mEq Oral Once   Continuous Infusions:   LOS: 0 days   Time spent: 35 mins  Deniya Craigo Wynetta Emery, MD How to contact the [facility] Attending or Consulting provider [place] or covering provider during after hours [place], for this patient?  Check the care team in [facility] and look for a) attending/consulting TRH provider listed and b) the [facility] team listed Log into [email or web address] and use [place]'s universal password to access. If you do not have the password, please contact the hospital operator. Locate the [facility] provider you are looking for under [facility] and page to a number that you can be directly reached. If you still have difficulty reaching the provider, please page the [facility] (Director on Call) for the Hospitalists listed on amion for assistance.  12/24/2022, 11:40 AM

## 2022-12-24 NOTE — Plan of Care (Signed)
  Problem: Education: Goal: Knowledge of General Education information will improve Description Including pain rating scale, medication(s)/side effects and non-pharmacologic comfort measures Outcome: Progressing   

## 2022-12-24 NOTE — Hospital Course (Signed)
66 y.o. female with medical history significant for pulmonary hypertension, asthma and OSA, diabetes mellitus, CKD 4, diastolic CHF, atrial fibrillation, coronary artery disease. Patient presented to the ED with complaints of chest pain and difficulty breathing.  She reports chest pain has been ongoing over the past several years- ? 17yr, intermittent, left-sided radiating across to the right, described as pressure-like, sharp, provoked by activity and resolves with rest.  Today she was getting ready for church when chest pain started.  Later while she was in church she started having difficulty breathing, she was dizzy while standing and would have fallen but she reached out to hold the pew.  Reports she checks her weight daily, she reports 7 pound weight gain over the past several days.,  And that her normal weight is about 241 pounds.  Reports bilateral lower extremity swelling worse than baseline, and abdominal bloating.,  No cough, no fevers. She reports she is mostly compliant with 40 mg of Lasix once daily, but per med list she is supposed to be on 40 to 80 mg twice daily.   Recent hospitalization 11/20 to 11/24, for decompensated CHF, AKI on CKD, COPD exacerbation and hyperkalemia.   In the interim, patient also underwent endometrial biopsy 12/20, with findings of well-differentiated endometrioid Adenocarcinoma.  She is been referred to GYN/ONC Also followed up with Dr. BHaroldine Lawsfor right heart cath done 12/21-impression mild pulmonary hypertension with mildly elevated PVR.

## 2022-12-24 NOTE — Assessment & Plan Note (Signed)
-  nightly CPAP ordered

## 2022-12-24 NOTE — Care Management Obs Status (Signed)
[facility] NOTIFICATION   Patient Details  Name: Sara Tran MRN: [id number] Date of Birth: 05-22-1956   Medicare Observation Status Notification Given:  Yes    Shade Flood, LCSW 12/24/2022, 11:38 AM

## 2022-12-24 NOTE — TOC Progression Note (Signed)
Transition of Care [facility]) - Progression Note    Patient Details  Name: JAWANNA DYKMAN MRN: [id number] Date of Birth: 27-Apr-1956   Transition of Care [facility]) Screening Note   Patient Details  Name: IQRA ROTUNDO Date of Birth: 29-Oct-1956   Transition of Care [facility]) CM/SW Contact:    Boneta Lucks, RN Phone Number: 12/24/2022, 8:49 AM    Transition of Care Department [facility]) has reviewed patient and no TOC needs have been identified at this time. We will continue to monitor patient advancement through interdisciplinary progression rounds. If new patient transition needs arise, please place a TOC consult.   Barriers to Discharge: Continued Medical Work up  Expected Discharge Plan and Services      Social Determinants of Health (SDOH) Interventions SDOH Screenings   Food Insecurity: Food Insecurity Present (10/09/2022)  Housing: Medium Risk (10/09/2022)  Transportation Needs: Unmet Transportation Needs (10/09/2022)  Utilities: Not At Risk (10/09/2022)  Alcohol Screen: Low Risk  (10/09/2022)  Depression (PHQ2-9): Medium Risk (10/09/2022)  Financial Resource Strain: Medium Risk (10/09/2022)  Physical Activity: Insufficiently Active (10/09/2022)  Social Connections: Moderately Isolated (10/09/2022)  Stress: Stress Concern Present (10/09/2022)  Tobacco Use: Medium Risk (12/23/2022)

## 2022-12-24 NOTE — Assessment & Plan Note (Signed)
--  Pt eval requested to ambulate patient

## 2022-12-24 NOTE — Assessment & Plan Note (Signed)
-   outpatient follow up with Dr. Haroldine Laws

## 2022-12-25 DIAGNOSIS — R0602 Shortness of breath: Secondary | ICD-10-CM | POA: Diagnosis present

## 2022-12-25 DIAGNOSIS — E1165 Type 2 diabetes mellitus with hyperglycemia: Secondary | ICD-10-CM | POA: Diagnosis present

## 2022-12-25 DIAGNOSIS — Z91040 Latex allergy status: Secondary | ICD-10-CM | POA: Diagnosis not present

## 2022-12-25 DIAGNOSIS — I509 Heart failure, unspecified: Secondary | ICD-10-CM

## 2022-12-25 DIAGNOSIS — I130 Hypertensive heart and chronic kidney disease with heart failure and stage 1 through stage 4 chronic kidney disease, or unspecified chronic kidney disease: Secondary | ICD-10-CM | POA: Diagnosis present

## 2022-12-25 DIAGNOSIS — I272 Pulmonary hypertension, unspecified: Secondary | ICD-10-CM | POA: Diagnosis present

## 2022-12-25 DIAGNOSIS — C541 Malignant neoplasm of endometrium: Secondary | ICD-10-CM | POA: Diagnosis present

## 2022-12-25 DIAGNOSIS — Z6841 Body Mass Index (BMI) 40.0 and over, adult: Secondary | ICD-10-CM | POA: Diagnosis not present

## 2022-12-25 DIAGNOSIS — Z955 Presence of coronary angioplasty implant and graft: Secondary | ICD-10-CM | POA: Diagnosis not present

## 2022-12-25 DIAGNOSIS — I2510 Atherosclerotic heart disease of native coronary artery without angina pectoris: Secondary | ICD-10-CM | POA: Diagnosis present

## 2022-12-25 DIAGNOSIS — D72829 Elevated white blood cell count, unspecified: Secondary | ICD-10-CM | POA: Diagnosis present

## 2022-12-25 DIAGNOSIS — Z888 Allergy status to other drugs, medicaments and biological substances status: Secondary | ICD-10-CM | POA: Diagnosis not present

## 2022-12-25 DIAGNOSIS — I252 Old myocardial infarction: Secondary | ICD-10-CM | POA: Diagnosis not present

## 2022-12-25 DIAGNOSIS — N184 Chronic kidney disease, stage 4 (severe): Secondary | ICD-10-CM | POA: Diagnosis present

## 2022-12-25 DIAGNOSIS — Z79899 Other long term (current) drug therapy: Secondary | ICD-10-CM | POA: Diagnosis not present

## 2022-12-25 DIAGNOSIS — Z91030 Bee allergy status: Secondary | ICD-10-CM | POA: Diagnosis not present

## 2022-12-25 DIAGNOSIS — I4891 Unspecified atrial fibrillation: Secondary | ICD-10-CM | POA: Diagnosis present

## 2022-12-25 DIAGNOSIS — Z87891 Personal history of nicotine dependence: Secondary | ICD-10-CM | POA: Diagnosis not present

## 2022-12-25 DIAGNOSIS — G4733 Obstructive sleep apnea (adult) (pediatric): Secondary | ICD-10-CM | POA: Diagnosis present

## 2022-12-25 DIAGNOSIS — Z881 Allergy status to other antibiotic agents status: Secondary | ICD-10-CM | POA: Diagnosis not present

## 2022-12-25 DIAGNOSIS — K219 Gastro-esophageal reflux disease without esophagitis: Secondary | ICD-10-CM | POA: Diagnosis present

## 2022-12-25 DIAGNOSIS — E1122 Type 2 diabetes mellitus with diabetic chronic kidney disease: Secondary | ICD-10-CM | POA: Diagnosis present

## 2022-12-25 DIAGNOSIS — I5033 Acute on chronic diastolic (congestive) heart failure: Secondary | ICD-10-CM | POA: Diagnosis present

## 2022-12-25 DIAGNOSIS — I2729 Other secondary pulmonary hypertension: Secondary | ICD-10-CM | POA: Diagnosis not present

## 2022-12-25 DIAGNOSIS — R072 Precordial pain: Secondary | ICD-10-CM | POA: Diagnosis not present

## 2022-12-25 DIAGNOSIS — Z8249 Family history of ischemic heart disease and other diseases of the circulatory system: Secondary | ICD-10-CM | POA: Diagnosis not present

## 2022-12-25 DIAGNOSIS — Z1152 Encounter for screening for COVID-19: Secondary | ICD-10-CM | POA: Diagnosis not present

## 2022-12-25 LAB — PHOSPHORUS: Phosphorus: 4 mg/dL (ref 2.5–4.6)

## 2022-12-25 LAB — BASIC METABOLIC PANEL
Anion gap: 9 (ref 5–15)
BUN: 30 mg/dL — ABNORMAL HIGH (ref 8–23)
CO2: 28 mmol/L (ref 22–32)
Calcium: 9.1 mg/dL (ref 8.9–10.3)
Chloride: 103 mmol/L (ref 98–111)
Creatinine, Ser: 2.07 mg/dL — ABNORMAL HIGH (ref 0.44–1.00)
GFR, Estimated: 26 mL/min — ABNORMAL LOW (ref >60)
Glucose, Bld: 98 mg/dL (ref 70–99)
Potassium: 3.9 mmol/L (ref 3.5–5.1)
Sodium: 140 mmol/L (ref 135–145)

## 2022-12-25 LAB — GLUCOSE, CAPILLARY
Glucose-Capillary: 121 mg/dL — ABNORMAL HIGH (ref 70–99)
Glucose-Capillary: 136 mg/dL — ABNORMAL HIGH (ref 70–99)
Glucose-Capillary: 119 mg/dL — ABNORMAL HIGH (ref 70–99)
Glucose-Capillary: 106 mg/dL — ABNORMAL HIGH (ref 70–99)

## 2022-12-25 LAB — MAGNESIUM: Magnesium: 1.8 mg/dL (ref 1.7–2.4)

## 2022-12-25 MED ORDER — FUROSEMIDE 10 MG/ML IJ SOLN
40.0000 mg | Freq: Every day | INTRAMUSCULAR | Status: DC
  Administered 2022-12-25 – 2022-12-26 (×2): 40 mg via INTRAVENOUS
  Filled 2022-12-25 (×2): qty 4

## 2022-12-25 NOTE — Progress Notes (Signed)
   12/24/22 1606  ReDS Vest / Clip  Station Marker B  Ruler Value 44  ReDS Value Range < 36  ReDS Actual Value 20

## 2022-12-25 NOTE — Progress Notes (Signed)
   12/25/22 1447  ReDS Vest / Clip  Station Marker B  Ruler Value 44  ReDS Value Range < 36  ReDS Actual Value 19

## 2022-12-25 NOTE — Plan of Care (Signed)
  Problem: Education: Goal: Knowledge of General Education information will improve Description: Including pain rating scale, medication(s)/side effects and non-pharmacologic comfort measures 12/25/2022 1125 by Santa Lighter, RN Outcome: Progressing 12/25/2022 1123 by Santa Lighter, RN Outcome: Progressing   Problem: Health Behavior/Discharge Planning: Goal: Ability to manage health-related needs will improve 12/25/2022 1125 by Santa Lighter, RN Outcome: Progressing 12/25/2022 1123 by Santa Lighter, RN Outcome: Progressing

## 2022-12-25 NOTE — Assessment & Plan Note (Signed)
--  Pt says she has outpatient follow up arranged with Gyn Oncology for further treatment

## 2022-12-25 NOTE — Progress Notes (Signed)
PROGRESS NOTE   Sara Tran  JHE:[id number] DOB: [birth year] DOA: 12/23/2022 PCP: Sharilyn Sites, MD   Chief Complaint  Patient presents with   Chest Pain   Shortness of Breath   Level of care: Telemetry  Brief Admission History:   66 y.o. female with medical history significant for pulmonary hypertension, asthma and OSA, diabetes mellitus, CKD 4, diastolic CHF, atrial fibrillation, coronary artery disease. Patient presented to the ED with complaints of chest pain and difficulty breathing.  She reports chest pain has been ongoing over the past several years- ? 42yr, intermittent, left-sided radiating across to the right, described as pressure-like, sharp, provoked by activity and resolves with rest.  Today she was getting ready for church when chest pain started.  Later while she was in church she started having difficulty breathing, she was dizzy while standing and would have fallen but she reached out to hold the pew.  Reports she checks her weight daily, she reports 7 pound weight gain over the past several days.,  And that her normal weight is about 241 pounds.  Reports bilateral lower extremity swelling worse than baseline, and abdominal bloating.,  No cough, no fevers. She reports she is mostly compliant with 40 mg of Lasix once daily, but per med list she is supposed to be on 40 to 80 mg twice daily.   Recent hospitalization 11/20 to 11/24, for decompensated CHF, AKI on CKD, COPD exacerbation and hyperkalemia.   In the interim, patient also underwent endometrial biopsy 12/20, with findings of well-differentiated endometrioid Adenocarcinoma.  She is been referred to GYN/ONC Also followed up with Dr. BHaroldine Lawsfor right heart cath done 12/21-impression mild pulmonary hypertension with mildly elevated PVR.     Assessment and Plan: * Chest pain Chest pain with dizziness and dyspnea.  Ongoing for several years.  Troponin 10 >. 11, EKG without significant change.  Underwent left and right  heart cath 2021, with findings of normal coronary arteries. Recent Right heart cath 12/23- Mild Pulm HTN with mild elevated PVR.   - Per notes chronic chest pains-since at least 02/2020.   -Resumed Imdur -Nitro as needed  Endometrial adenocarcinoma ([place] --Pt says she has outpatient follow up arranged with Gyn Oncology for further treatment  Leukocytosis Leukocytosis of 14.9.  Initial heart rate of 105, has since resolved.  Lactic acid of 3 > 2.1, after 1.6 L bolus given.  CT chest abdomen and pelvis without acute abnormality.  UA not suggestive of infection.  At this time no focus of infection identified. -Follow-up blood cultures -Defer further antibiotics -procalcitonin <0.10 which is reassuring   Essential hypertension Stable. -Resumed Norvasc, hydralazine, Imdur, losartan-HCTZ  Pulmonary hypertension due to sleep-disordered breathing ([place] - outpatient follow up with Dr. BHaroldine Laws  Diabetes mellitus ([place] Controlled A1c 6.1. - SSI - M -Hold Trulicity CBG (last 3)  Recent Labs    12/24/22 2153 12/25/22 0731 12/25/22 1137  GLUCAP 124* 121* 136*     Morbid obesity (HCC) --Pt eval requested to ambulate patient   OSA on CPAP -nightly CPAP ordered   Acute on chronic diastolic CHF (congestive heart failure) (HCC) Reports 7 pound weight gain, has bilateral pitting lower extremity edema.  Per charts with since stable from recent discharge weight- 245, and 247 today.  BNP elevated at 65 compared to recent- 27.  CT chest without contrast negative for acute abnormality.  Not compliant with prescribed dose of Lasix. -Recent echo- 10/2022- EF 60 to 65%. -IV Lasix 40 mg x 2  doses with good urine output reported  -Daily BMP -Strict input output, daily weights -I have requested a redsVest reading since was not done on admission -ReDs Vest readings have been reassuring -mild bump in cr today; will give diuretic holiday today -resume home oral furosemide starting 12/26/22 and  after negotiating with patient she was agreeable to discharge home on 12/27 if we can get a PT eval done.    DVT prophylaxis: Romeo heparin Code Status: full  Family Communication:  Disposition: Status is: Observation The patient remains OBS appropriate and will d/c before 2 midnights.   Consultants:   Procedures:   Antimicrobials:    Subjective: Pt reports that she is breathing much better today but has not ambulated and afraid to go home.     Objective: Vitals:   12/24/22 2148 12/25/22 0506 12/25/22 0720 12/25/22 1431  BP: 137/63 129/65  (!) 141/70  Pulse: 72 99  89  Resp: '18 20  17  '$ Temp: 98.5 F (36.9 C) 98.9 F (37.2 C)  98.7 F (37.1 C)  TempSrc: Oral Oral  Oral  SpO2: 96% 95% 95% 97%  Weight:  110.1 kg    Height:        Intake/Output Summary (Last 24 hours) at 12/25/2022 1537 Last data filed at 12/25/2022 0810 Gross per 24 hour  Intake 240 ml  Output 1200 ml  Net -960 ml   Filed Weights   12/23/22 2117 12/24/22 0500 12/25/22 0506  Weight: 115.3 kg 115 kg 110.1 kg   Examination:  General exam: awake, alert, obese female, lying supine, Appears calm and comfortable  Respiratory system: no significant increased work of breathing. Cardiovascular system: normal S1 & S2 heard. No JVD, murmurs, rubs, gallops or clicks. No pedal edema. Gastrointestinal system: Abdomen is nondistended, soft and nontender. No organomegaly or masses felt. Normal bowel sounds heard. Central nervous system: Alert and oriented. No focal neurological deficits. Extremities: Symmetric 5 x 5 power. Skin: No rashes, lesions or ulcers. Psychiatry: Judgement and insight appear normal. Mood & affect appropriate.   Data Reviewed: I have personally reviewed following labs and imaging studies  CBC: Recent Labs  Lab 12/20/22 1337 12/23/22 1300 12/24/22 0504  WBC  --  14.9* 12.1*  NEUTROABS  --  13.6*  --   HGB 11.2*  11.9* 11.5* 10.8*  HCT 33.0*  35.0* 35.7* 32.8*  MCV  --  102.0* 100.0   PLT  --  289 361    Basic Metabolic Panel: Recent Labs  Lab 12/20/22 1337 12/23/22 1300 12/24/22 0504 12/25/22 0459  NA 147*  144 143 145 140  K 3.6  4.0 4.0 3.4* 3.9  CL  --  109 110 103  CO2  --  '24 26 28  '$ GLUCOSE  --  186* 114* 98  BUN  --  32* 29* 30*  CREATININE  --  2.00* 1.76* 2.07*  CALCIUM  --  9.2 9.1 9.1  MG  --   --   --  1.8  PHOS  --   --   --  4.0    CBG: Recent Labs  Lab 12/24/22 1224 12/24/22 1715 12/24/22 2153 12/25/22 0731 12/25/22 1137  GLUCAP 123* 101* 124* 121* 136*    Recent Results (from the past 240 hour(s))  Resp panel by RT-PCR (RSV, Flu A&B, Covid) Anterior Nasal Swab     Status: None   Collection Time: 12/23/22 12:25 PM   Specimen: Anterior Nasal Swab  Result Value Ref Range Status   SARS  Coronavirus 2 by RT PCR NEGATIVE NEGATIVE Final    Comment: (NOTE) SARS-CoV-2 target nucleic acids are NOT DETECTED.  The SARS-CoV-2 RNA is generally detectable in upper respiratory specimens during the acute phase of infection. The lowest concentration of SARS-CoV-2 viral copies this assay can detect is 138 copies/mL. A negative result does not preclude SARS-Cov-2 infection and should not be used as the sole basis for treatment or other patient management decisions. A negative result may occur with  improper specimen collection/handling, submission of specimen other than nasopharyngeal swab, presence of viral mutation(s) within the areas targeted by this assay, and inadequate number of viral copies(<138 copies/mL). A negative result must be combined with clinical observations, patient history, and epidemiological information. The expected result is Negative.  Fact Sheet for Patients:  EntrepreneurPulse.com.au  Fact Sheet for Healthcare Providers:  IncredibleEmployment.be  This test is no t yet approved or cleared by the [place] FDA and  has been authorized for detection and/or diagnosis of  SARS-CoV-2 by FDA under an Emergency Use Authorization (EUA). This EUA will remain  in effect (meaning this test can be used) for the duration of the COVID-19 declaration under Section 564(b)(1) of the Act, 21 U.S.C.section 360bbb-3(b)(1), unless the authorization is terminated  or revoked sooner.       Influenza A by PCR NEGATIVE NEGATIVE Final   Influenza B by PCR NEGATIVE NEGATIVE Final    Comment: (NOTE) The Xpert Xpress SARS-CoV-2/FLU/RSV plus assay is intended as an aid in the diagnosis of influenza from Nasopharyngeal swab specimens and should not be used as a sole basis for treatment. Nasal washings and aspirates are unacceptable for Xpert Xpress SARS-CoV-2/FLU/RSV testing.  Fact Sheet for Patients: EntrepreneurPulse.com.au  Fact Sheet for Healthcare Providers: IncredibleEmployment.be  This test is not yet approved or cleared by the [place] FDA and has been authorized for detection and/or diagnosis of SARS-CoV-2 by FDA under an Emergency Use Authorization (EUA). This EUA will remain in effect (meaning this test can be used) for the duration of the COVID-19 declaration under Section 564(b)(1) of the Act, 21 U.S.C. section 360bbb-3(b)(1), unless the authorization is terminated or revoked.     Resp Syncytial Virus by PCR NEGATIVE NEGATIVE Final    Comment: (NOTE) Fact Sheet for Patients: EntrepreneurPulse.com.au  Fact Sheet for Healthcare Providers: IncredibleEmployment.be  This test is not yet approved or cleared by the [place] FDA and has been authorized for detection and/or diagnosis of SARS-CoV-2 by FDA under an Emergency Use Authorization (EUA). This EUA will remain in effect (meaning this test can be used) for the duration of the COVID-19 declaration under Section 564(b)(1) of the Act, 21 U.S.C. section 360bbb-3(b)(1), unless the authorization is terminated  or revoked.  Performed at [facility], [address] [place], [place] [address]   Blood Culture (routine x 2)     Status: None (Preliminary result)   Collection Time: 12/23/22  2:10 PM   Specimen: BLOOD RIGHT ARM  Result Value Ref Range Status   Specimen Description BLOOD RIGHT ARM  Final   Special Requests   Final    BOTTLES DRAWN AEROBIC AND ANAEROBIC Blood Culture results may not be optimal due to an excessive volume of blood received in culture bottles   Culture   Final    NO GROWTH 2 DAYS Performed at [facility], [address], [place],  [address]    Report Status PENDING  Incomplete  Blood Culture (routine x 2)     Status: None (Preliminary result)  Collection Time: 12/23/22  2:10 PM   Specimen: BLOOD LEFT ARM  Result Value Ref Range Status   Specimen Description BLOOD LEFT ARM  Final   Special Requests   Final    BOTTLES DRAWN AEROBIC AND ANAEROBIC Blood Culture results may not be optimal due to an excessive volume of blood received in culture bottles   Culture   Final    NO GROWTH 2 DAYS Performed at [facility], [address] [place], [place] [address]    Report Status PENDING  Incomplete     Radiology Studies: CT Chest Wo Contrast  Result Date: 12/23/2022 CLINICAL DATA:  Respiratory illness. Sepsis. Chest pain and shortness of breath since this morning. EXAM: CT CHEST, ABDOMEN AND PELVIS WITHOUT CONTRAST TECHNIQUE: Multidetector CT imaging of the chest, abdomen and pelvis was performed following the standard protocol without IV contrast. RADIATION DOSE REDUCTION: This exam was performed according to the departmental dose-optimization program which includes automated exposure control, adjustment of the mA and/or kV according to patient size and/or use of iterative reconstruction technique. COMPARISON:  06/28/2022. FINDINGS: CT CHEST FINDINGS Cardiovascular: Heart normal in size and configuration. No pericardial effusion. Minimal left coronary artery  calcifications. Great vessels are normal in caliber. Mild aortic atherosclerotic calcifications. Mediastinum/Nodes: No enlarged mediastinal, hilar, or axillary lymph nodes. Thyroid gland, trachea, and esophagus demonstrate no significant findings. Lungs/Pleura: Lungs are clear. No pleural effusion or pneumothorax. Musculoskeletal: No fracture or acute finding. No bone lesion. No chest wall mass. CT ABDOMEN PELVIS FINDINGS Hepatobiliary: No focal liver abnormality is seen. Status post cholecystectomy. No biliary dilatation. Pancreas: Unremarkable. No pancreatic ductal dilatation or surrounding inflammatory changes. Spleen: Normal in size without focal abnormality. Adrenals/Urinary Tract: Normal adrenal glands. 4 mm stone, lower pole the right kidney. No renal masses, other stones or hydronephrosis. Ureters are normal in course and in caliber. Bladder is unremarkable. Stomach/Bowel: Well positioned lap band at the gastroesophageal junction. This is stable. Stomach unremarkable. Small bowel and colon are normal in caliber. No wall thickening. No inflammation. Vascular/Lymphatic: Aortic atherosclerotic calcifications. No aneurysm. No enlarged lymph nodes. Reproductive: Densely calcified exophytic fibroid extends toward the left adnexa from the left upper uterine segment, 6 cm in size, unchanged. Smaller uterine calcifications suggest additional small fibroids. No adnexal masses. Other: No abdominal wall hernia or abnormality. No abdominopelvic ascites. Musculoskeletal: No fracture or acute finding.  No bone lesion. IMPRESSION: 1. No acute findings within the chest, abdomen or pelvis. No evidence of infection. Findings stable from the prior abdomen and pelvis CT from 06/28/2022. 2. Aortic atherosclerosis. 3. Nonobstructing stone in the lower pole the right kidney. 4. Calcified uterine fibroids. Electronically Signed   By: Lajean Manes M.D.   On: 12/23/2022 16:05   CT ABDOMEN PELVIS WO CONTRAST  Result Date:  12/23/2022 CLINICAL DATA:  Respiratory illness. Sepsis. Chest pain and shortness of breath since this morning. EXAM: CT CHEST, ABDOMEN AND PELVIS WITHOUT CONTRAST TECHNIQUE: Multidetector CT imaging of the chest, abdomen and pelvis was performed following the standard protocol without IV contrast. RADIATION DOSE REDUCTION: This exam was performed according to the departmental dose-optimization program which includes automated exposure control, adjustment of the mA and/or kV according to patient size and/or use of iterative reconstruction technique. COMPARISON:  06/28/2022. FINDINGS: CT CHEST FINDINGS Cardiovascular: Heart normal in size and configuration. No pericardial effusion. Minimal left coronary artery calcifications. Great vessels are normal in caliber. Mild aortic atherosclerotic calcifications. Mediastinum/Nodes: No enlarged mediastinal, hilar, or axillary lymph nodes. Thyroid gland, trachea, and esophagus demonstrate  no significant findings. Lungs/Pleura: Lungs are clear. No pleural effusion or pneumothorax. Musculoskeletal: No fracture or acute finding. No bone lesion. No chest wall mass. CT ABDOMEN PELVIS FINDINGS Hepatobiliary: No focal liver abnormality is seen. Status post cholecystectomy. No biliary dilatation. Pancreas: Unremarkable. No pancreatic ductal dilatation or surrounding inflammatory changes. Spleen: Normal in size without focal abnormality. Adrenals/Urinary Tract: Normal adrenal glands. 4 mm stone, lower pole the right kidney. No renal masses, other stones or hydronephrosis. Ureters are normal in course and in caliber. Bladder is unremarkable. Stomach/Bowel: Well positioned lap band at the gastroesophageal junction. This is stable. Stomach unremarkable. Small bowel and colon are normal in caliber. No wall thickening. No inflammation. Vascular/Lymphatic: Aortic atherosclerotic calcifications. No aneurysm. No enlarged lymph nodes. Reproductive: Densely calcified exophytic fibroid extends  toward the left adnexa from the left upper uterine segment, 6 cm in size, unchanged. Smaller uterine calcifications suggest additional small fibroids. No adnexal masses. Other: No abdominal wall hernia or abnormality. No abdominopelvic ascites. Musculoskeletal: No fracture or acute finding.  No bone lesion. IMPRESSION: 1. No acute findings within the chest, abdomen or pelvis. No evidence of infection. Findings stable from the prior abdomen and pelvis CT from 06/28/2022. 2. Aortic atherosclerosis. 3. Nonobstructing stone in the lower pole the right kidney. 4. Calcified uterine fibroids. Electronically Signed   By: Lajean Manes M.D.   On: 12/23/2022 16:05    Scheduled Meds:  amLODipine  10 mg Oral QPM   ascorbic acid  500 mg Oral Daily   cycloSPORINE  1 drop Both Eyes BID   fenofibrate  160 mg Oral Daily   fluticasone  1 spray Each Nare Daily   furosemide  40 mg Intravenous Daily   gabapentin  200 mg Oral QID   guaiFENesin  1,200 mg Oral BID   heparin  5,000 Units Subcutaneous Q8H   insulin aspart  0-15 Units Subcutaneous TID WC   insulin aspart  0-5 Units Subcutaneous QHS   isosorbide mononitrate  30 mg Oral Daily   linaclotide  72 mcg Oral QAC breakfast   Living Better with Heart Failure Book   Does not apply Once   mometasone-formoterol  2 puff Inhalation BID   nystatin   Topical BID   potassium chloride SA  20 mEq Oral Daily   pravastatin  40 mg Oral q1800   Continuous Infusions:   LOS: 0 days   Time spent: 35 mins  Reita Shindler Wynetta Emery, MD How to contact the [facility] Attending or Consulting provider [place] or covering provider during after hours [place], for this patient?  Check the care team in [facility] and look for a) attending/consulting [facility] provider listed and b) the [facility] team listed Log into [email or web address] and use [place]'s universal password to access. If you do not have the password, please contact the hospital operator. Locate the [facility] provider you are looking for under [facility] and page to a number that you can be directly reached. If you still have difficulty reaching the provider, please page the [facility] (Director on Call) for the Hospitalists listed on amion for assistance.  12/25/2022, 3:37 PM

## 2022-12-25 NOTE — Evaluation (Signed)
Physical Therapy Evaluation Patient Details Name: Sara Tran MRN: [id number] DOB: 1956-06-21 Today's Date: 12/25/2022  History of Present Illness  Sara Tran is a 66 y.o. female with medical history significant for pulmonary hypertension, asthma and OSA, diabetes mellitus, CKD 4, diastolic CHF, atrial fibrillation, coronary artery disease.  Patient presented to the ED with complaints of chest pain and difficulty breathing.  She reports chest pain has been ongoing over the past several years- ? 72yr, intermittent, left-sided radiating across to the right, described as pressure-like, sharp, provoked by activity and resolves with rest.  Today she was getting ready for church when chest pain started.  Later while she was in church she started having difficulty breathing, she was dizzy while standing and would have fallen but she reached out to hold the pew.  Reports she checks her weight daily, she reports 7 pound weight gain over the past several days.,  And that her normal weight is about 241 pounds.  Reports bilateral lower extremity swelling worse than baseline, and abdominal bloating.,  No cough, no fevers.  She reports she is mostly compliant with 40 mg of Lasix once daily, but per med list she is supposed to be on 40 to 80 mg twice daily.   Clinical Impression  Patient functioning near baseline for functional mobility and gait demonstrating good return for bed mobility, transfers and ambulating in room/hallway without loss of balance using RW.  Patient encouraged to ambulate with nursing staff as tolerated for length of stay.  Plan:  Patient discharged from physical therapy to care of nursing for ambulation daily as tolerated for length of stay.         Recommendations for follow up therapy are one component of a multi-disciplinary discharge planning process, led by the attending physician.  Recommendations may be updated based on patient status, additional functional criteria and insurance  authorization.  Follow Up Recommendations No PT follow up      Assistance Recommended at Discharge Set up Supervision/Assistance  Patient can return home with the following  A little help with bathing/dressing/bathroom;Help with stairs or ramp for entrance;Assistance with cooking/housework;A little help with walking and/or transfers    Equipment Recommendations None recommended by PT  Recommendations for Other Services       Functional Status Assessment Patient has had a recent decline in their functional status and/or demonstrates limited ability to make significant improvements in function in a reasonable and predictable amount of time     Precautions / Restrictions Precautions Precautions: Fall Restrictions Weight Bearing Restrictions: No      Mobility  Bed Mobility Overal bed mobility: Modified Independent                  Transfers Overall transfer level: Modified independent                      Ambulation/Gait Ambulation/Gait assistance: Modified independent (Device/Increase time) Gait Distance (Feet): 100 Feet Assistive device: Rolling walker (2 wheels) Gait Pattern/deviations: Decreased step length - right, Decreased step length - left, Decreased stride length Gait velocity: decreased     General Gait Details: slow unsteady movement without AD, requrired use of RW for longer distances demonstrating good return for ambulating in room and hallway without loss of balance, limited mostly due to fatigue  Stairs            Wheelchair Mobility    Modified Rankin (Stroke Patients Only)       Balance Overall balance  assessment: Needs assistance Sitting-balance support: Feet supported, No upper extremity supported Sitting balance-Leahy Scale: Good Sitting balance - Comments: seated at EOB   Standing balance support: During functional activity, No upper extremity supported Standing balance-Leahy Scale: Fair Standing balance comment:  fair/good using RW                             Pertinent Vitals/Pain Pain Assessment Pain Assessment: Faces Faces Pain Scale: Hurts a little bit Pain Location: chest when coughing Pain Descriptors / Indicators: Sore Pain Intervention(s): Limited activity within patient's tolerance, Monitored during session, Repositioned    Home Living Family/patient expects to be discharged to:: Private residence Living Arrangements: Non-relatives/Friends Available Help at Discharge: Available PRN/intermittently;Friend(s) Type of Home: House Home Access: Ramped entrance       Home Layout: One level Home Equipment: Conservation officer, nature (2 wheels);Wheelchair - manual;BSC/3in1;Shower seat;Grab bars - tub/shower      Prior Function Prior Level of Function : Independent/Modified Independent             Mobility Comments: household and short distanced ambulator, uses Web designer in grocery stores ADLs Comments: Independent     Hand Dominance        Extremity/Trunk Assessment   Upper Extremity Assessment Upper Extremity Assessment: Overall WFL for tasks assessed    Lower Extremity Assessment Lower Extremity Assessment: Generalized weakness    Cervical / Trunk Assessment Cervical / Trunk Assessment: Normal  Communication   Communication: No difficulties  Cognition Arousal/Alertness: Awake/alert Behavior During Therapy: WFL for tasks assessed/performed Overall Cognitive Status: Within Functional Limits for tasks assessed                                          General Comments      Exercises     Assessment/Plan    PT Assessment Patient does not need any further PT services  PT Problem List         PT Treatment Interventions      PT Goals (Current goals can be found in the Care Plan section)  Acute Rehab PT Goals Patient Stated Goal: return home PT Goal Formulation: With patient Time For Goal Achievement: 12/25/22 Potential to Achieve  Goals: Good    Frequency       Co-evaluation               AM-PAC PT "6 Clicks" Mobility  Outcome Measure Help needed turning from your back to your side while in a flat bed without using bedrails?: None Help needed moving from lying on your back to sitting on the side of a flat bed without using bedrails?: None Help needed moving to and from a bed to a chair (including a wheelchair)?: None Help needed standing up from a chair using your arms (e.g., wheelchair or bedside chair)?: None Help needed to walk in hospital room?: A Little Help needed climbing 3-5 steps with a railing? : A Little 6 Click Score: 22    End of Session   Activity Tolerance: Patient tolerated treatment well;Patient limited by fatigue Patient left: in bed;with call bell/phone within reach Nurse Communication: Mobility status PT Visit Diagnosis: Unsteadiness on feet (R26.81);Other abnormalities of gait and mobility (R26.89);Muscle weakness (generalized) (M62.81)    Time: 0034-9179 PT Time Calculation (min) (ACUTE ONLY): 16 min   Charges:   PT Evaluation $PT Eval Low  Complexity: 1 Low PT Treatments $Therapeutic Activity: 8-22 mins        3:35 PM, 12/25/22 Lonell Grandchild, MPT Physical Therapist with [facility] [phone] office [phone] mobile phone

## 2022-12-25 NOTE — Plan of Care (Signed)
  Problem: Education: Goal: Knowledge of General Education information will improve Description Including pain rating scale, medication(s)/side effects and non-pharmacologic comfort measures Outcome: Progressing   Problem: Health Behavior/Discharge Planning: Goal: Ability to manage health-related needs will improve Outcome: Progressing   

## 2022-12-26 DIAGNOSIS — R072 Precordial pain: Secondary | ICD-10-CM | POA: Diagnosis not present

## 2022-12-26 DIAGNOSIS — I5033 Acute on chronic diastolic (congestive) heart failure: Secondary | ICD-10-CM | POA: Diagnosis not present

## 2022-12-26 DIAGNOSIS — I2729 Other secondary pulmonary hypertension: Secondary | ICD-10-CM | POA: Diagnosis not present

## 2022-12-26 LAB — BASIC METABOLIC PANEL
Anion gap: 11 (ref 5–15)
BUN: 31 mg/dL — ABNORMAL HIGH (ref 8–23)
CO2: 27 mmol/L (ref 22–32)
Calcium: 9.1 mg/dL (ref 8.9–10.3)
Chloride: 101 mmol/L (ref 98–111)
Creatinine, Ser: 2.13 mg/dL — ABNORMAL HIGH (ref 0.44–1.00)
GFR, Estimated: 25 mL/min — ABNORMAL LOW (ref >60)
Glucose, Bld: 96 mg/dL (ref 70–99)
Potassium: 3.7 mmol/L (ref 3.5–5.1)
Sodium: 139 mmol/L (ref 135–145)

## 2022-12-26 LAB — GLUCOSE, CAPILLARY: Glucose-Capillary: 142 mg/dL — ABNORMAL HIGH (ref 70–99)

## 2022-12-26 MED ORDER — TORSEMIDE 40 MG PO TABS: 40.0000 mg | ORAL_TABLET | Freq: Every day | ORAL | 1 refills | Status: DC

## 2022-12-26 MED ORDER — TORSEMIDE 20 MG PO TABS: 40.0000 mg | ORAL_TABLET | Freq: Every day | ORAL | Status: DC

## 2022-12-26 NOTE — Discharge Summary (Signed)
Physician Discharge Summary   Patient: Sara Tran MRN: [id number] DOB: 08/12/1956  Admit date:     12/23/2022  Discharge date: 12/26/22  Discharge Physician: Shanon Brow Alyannah Sanks   PCP: Sharilyn Sites, MD   Recommendations at discharge:   Please follow up with primary care provider within 1-2 weeks  Please repeat BMP and CBC in one week     Hospital Course:  66 y.o. female with medical history significant for pulmonary hypertension, asthma and OSA, diabetes mellitus, CKD 4, diastolic CHF, atrial fibrillation, coronary artery disease. Patient presented to the ED with complaints of chest pain and difficulty breathing.  She reports chest pain has been ongoing over the past several years- ? 54yr, intermittent, left-sided radiating across to the right, described as pressure-like, sharp, provoked by activity and resolves with rest.  Today she was getting ready for church when chest pain started.  Later while she was in church she started having difficulty breathing, she was dizzy while standing and would have fallen but she reached out to hold the pew.  Reports she checks her weight daily, she reports 7 pound weight gain over the past several days.,  And that her normal weight is about 241 pounds.  Reports bilateral lower extremity swelling worse than baseline, and abdominal bloating.,  No cough, no fevers. She reports she is mostly compliant with 40 mg of Lasix once daily, but per med list she is supposed to be on 40 to 80 mg twice daily.   Recent hospitalization 11/20 to 11/24, for decompensated CHF, AKI on CKD, COPD exacerbation and hyperkalemia.   In the interim, patient also underwent endometrial biopsy 12/20, with findings of well-differentiated endometrioid Adenocarcinoma.  She is been referred to GYN/ONC Also followed up with Dr. BHaroldine Lawsfor right heart cath done 12/21-impression mild pulmonary hypertension with mildly elevated PVR.    Assessment and Plan:  Chest pain Chest pain with  dizziness and dyspnea.  Ongoing for several years.  Troponin 10 > 11, EKG without significant change.  Underwent left and right heart cath 2021, with findings of normal coronary arteries. Recent Right heart cath 12/23- Mild Pulm HTN with mild elevated PVR.   - Per notes chronic chest pains-since at least 02/2020.   -Resumed Imdur -personally reviewed EKG--sinus, nonspecific ST change   Endometrial adenocarcinoma ([facility] --Pt says she has outpatient follow up arranged with Gyn Oncology for further treatment   Leukocytosis Leukocytosis of 14.9.  Initial heart rate of 105, has since resolved.  Lactic acid of 3 > 2.1, after 1.6 L bolus given.  CT chest abdomen and pelvis without acute abnormality.  UA not suggestive of infection.  At this time no focus of infection identified. -Follow-up blood cultures--neg to date>>remains afebrile and hemodynamically stable -Defer further antibiotics -procalcitonin <0.10  -CT chest/abd/pelvis--neg for acute findings;  lungs clear   Essential hypertension Stable. -Resumed Norvasc, hydralazine, Imdur, losartan-HCTZ   Pulmonary hypertension due to sleep-disordered breathing ([facility] - outpatient follow up with Dr. BHaroldine Laws   Diabetes mellitus ([facility] Controlled A1c 6.1. - SSI - M -Hold Trulicity during hospitalization   Morbid obesity ([facility] --Pt eval requested to ambulate patient    OSA on CPAP -nightly CPAP ordered    Acute on chronic diastolic CHF (congestive heart failure) ([facility] Reports 7 pound weight gain, has bilateral pitting lower extremity edema.  Per charts with since stable from recent discharge weight- 245, and 247 today.  BNP elevated at 65 compared to recent- 27.  CT chest without contrast negative for  acute abnormality.  Not compliant with prescribed dose of Lasix. -Recent echo- 10/2022- EF 60 to 65%. -IV Lasix 40 mg daily -discharge weight 241 lbs; clinically euvolemic -Strict input output--neg 2.8L -I have requested a redsVest  reading--19 -d/c home with torsemide 40 mg daily 11/19/22 Echo--EF 60-65%, severe pulm HTN         Consultants: none Procedures performed: none  Disposition: Home Diet recommendation:  Cardiac diet DISCHARGE MEDICATION: Allergies as of 12/26/2022       Reactions   Ancef [cefazolin] Anaphylaxis, Shortness Of Breath      Bee Venom Anaphylaxis   As a child   Meloxicam    Gi upset    Nsaids Other (See Comments)   Rectal bleeding, stopped heart    Ceftin [cefuroxime Axetil] Swelling   Swollen tongue   Chicken Meat (diagnostic) Nausea And Vomiting   Ciprofloxacin Cough   Coughed up blood   Macrodantin [nitrofurantoin] Other (See Comments)   Vomited blood and had dark stool.   Meat [alpha-gal] Nausea And Vomiting   Other Other (See Comments)   Snake venom - Scaly Scalp, SOB, Night Terrors   Latex Itching, Rash   Milk-related Compounds Other (See Comments)   IBS        Medication List     STOP taking these medications    furosemide 40 MG tablet Commonly known as: LASIX   losartan-hydrochlorothiazide 100-25 MG tablet Commonly known as: HYZAAR   predniSONE 10 MG tablet Commonly known as: DELTASONE       TAKE these medications    albuterol (2.5 MG/3ML) 0.083% nebulizer solution Commonly known as: PROVENTIL Take 2.5 mg by nebulization every 4 (four) hours as needed for wheezing or shortness of breath.   albuterol 108 (90 Base) MCG/ACT inhaler Commonly known as: VENTOLIN HFA Inhale 2 puffs into the lungs every 6 (six) hours as needed for wheezing or shortness of breath.   ALPRAZolam 0.5 MG tablet Commonly known as: XANAX Take 0.5 mg by mouth at bedtime as needed for anxiety or sleep.   amLODipine 10 MG tablet Commonly known as: NORVASC Take 10 mg by mouth every evening.   baclofen 10 MG tablet Commonly known as: LIORESAL TAKE 1 TO 2 TABLETS FOUR TIMES DAILY What changed: See the new instructions.   clobetasol 0.05 % external solution Commonly  known as: TEMOVATE Apply 1 application  topically 2 (two) times daily.   Dialyvite Vitamin D 5000 125 MCG (5000 UT) capsule Generic drug: Cholecalciferol Take 5,000 Units by mouth once a week.   EPINEPHrine 0.3 mg/0.3 mL Soaj injection Commonly known as: EPI-PEN Inject 0.3 mg into the muscle as needed for anaphylaxis.   fenofibrate 160 MG tablet Take 160 mg by mouth daily.   fluticasone 50 MCG/ACT nasal spray Commonly known as: FLONASE Place 1 spray into both nostrils daily.   gabapentin 100 MG capsule Commonly known as: NEURONTIN Take 2 capsules (200 mg total) by mouth 4 (four) times daily.   isosorbide mononitrate 30 MG 24 hr tablet Commonly known as: IMDUR Take 30 mg by mouth daily.   levocetirizine 5 MG tablet Commonly known as: XYZAL Take 5 mg by mouth at bedtime.   linaclotide 72 MCG capsule Commonly known as: Linzess Take 1 capsule (72 mcg total) by mouth daily before breakfast.   magnesium oxide 400 (240 Mg) MG tablet Commonly known as: MAG-OX Take 400 mg by mouth daily.   multivitamin with minerals Tabs tablet Take 1 tablet by mouth daily.   nitroGLYCERIN  0.4 MG SL tablet Commonly known as: NITROSTAT Place 0.4 mg under the tongue every 5 (five) minutes as needed for chest pain.   nystatin ointment Commonly known as: MYCOSTATIN Apply 1 Application topically 2 (two) times daily for 14 days.   ondansetron 4 MG tablet Commonly known as: ZOFRAN Take 4 mg by mouth every 8 (eight) hours as needed for nausea or vomiting.   oxybutynin 10 MG 24 hr tablet Commonly known as: DITROPAN-XL Take 1 tablet (10 mg total) by mouth daily as needed.   Oxycodone HCl 10 MG Tabs Take 10 mg by mouth every 6 (six) hours as needed (pain).   potassium chloride SA 20 MEQ tablet Commonly known as: KLOR-CON M Take 20 mEq by mouth daily.   pravastatin 40 MG tablet Commonly known as: PRAVACHOL Take 40 mg by mouth daily.   Restasis 0.05 % ophthalmic emulsion Generic drug:  cycloSPORINE Place 1 drop into both eyes 2 (two) times daily.   Symbicort 160-4.5 MCG/ACT inhaler Generic drug: budesonide-formoterol Inhale 2 puffs into the lungs 2 (two) times daily.   Torsemide 40 MG Tabs Take 40 mg by mouth daily. Start taking on: December 27, 2022   True Metrix Air Glucose Meter w/Device Kit   True Metrix Blood Glucose Test test strip Generic drug: glucose blood   True Metrix Level 2 Normal Soln   TRUEplus Lancets 03O Misc   Trulicity 1.5 ZY/2.4MG Sopn Generic drug: Dulaglutide Inject 1.5 mg into the skin every 7 (seven) days.   Vitamin C 500 MG Caps Take 500 mg by mouth daily.        Discharge Exam: Filed Weights   12/23/22 2117 12/24/22 0500 12/25/22 0506  Weight: 115.3 kg 115 kg 110.1 kg   HEENT:  [place]/AT, No thrush, no icterus CV:  RRR, no rub, no S3, no S4 Lung:  CTA, no wheeze, no rhonchi Abd:  soft/+BS, NT Ext:  No edema, no lymphangitis, no synovitis, no rash   Condition at discharge: stable  The results of significant diagnostics from this hospitalization (including imaging, microbiology, ancillary and laboratory) are listed below for reference.   Imaging Studies: CT Chest Wo Contrast  Result Date: 12/23/2022 CLINICAL DATA:  Respiratory illness. Sepsis. Chest pain and shortness of breath since this morning. EXAM: CT CHEST, ABDOMEN AND PELVIS WITHOUT CONTRAST TECHNIQUE: Multidetector CT imaging of the chest, abdomen and pelvis was performed following the standard protocol without IV contrast. RADIATION DOSE REDUCTION: This exam was performed according to the departmental dose-optimization program which includes automated exposure control, adjustment of the mA and/or kV according to patient size and/or use of iterative reconstruction technique. COMPARISON:  06/28/2022. FINDINGS: CT CHEST FINDINGS Cardiovascular: Heart normal in size and configuration. No pericardial effusion. Minimal left coronary artery calcifications. Great vessels are  normal in caliber. Mild aortic atherosclerotic calcifications. Mediastinum/Nodes: No enlarged mediastinal, hilar, or axillary lymph nodes. Thyroid gland, trachea, and esophagus demonstrate no significant findings. Lungs/Pleura: Lungs are clear. No pleural effusion or pneumothorax. Musculoskeletal: No fracture or acute finding. No bone lesion. No chest wall mass. CT ABDOMEN PELVIS FINDINGS Hepatobiliary: No focal liver abnormality is seen. Status post cholecystectomy. No biliary dilatation. Pancreas: Unremarkable. No pancreatic ductal dilatation or surrounding inflammatory changes. Spleen: Normal in size without focal abnormality. Adrenals/Urinary Tract: Normal adrenal glands. 4 mm stone, lower pole the right kidney. No renal masses, other stones or hydronephrosis. Ureters are normal in course and in caliber. Bladder is unremarkable. Stomach/Bowel: Well positioned lap band at the gastroesophageal junction. This is stable.  Stomach unremarkable. Small bowel and colon are normal in caliber. No wall thickening. No inflammation. Vascular/Lymphatic: Aortic atherosclerotic calcifications. No aneurysm. No enlarged lymph nodes. Reproductive: Densely calcified exophytic fibroid extends toward the left adnexa from the left upper uterine segment, 6 cm in size, unchanged. Smaller uterine calcifications suggest additional small fibroids. No adnexal masses. Other: No abdominal wall hernia or abnormality. No abdominopelvic ascites. Musculoskeletal: No fracture or acute finding.  No bone lesion. IMPRESSION: 1. No acute findings within the chest, abdomen or pelvis. No evidence of infection. Findings stable from the prior abdomen and pelvis CT from 06/28/2022. 2. Aortic atherosclerosis. 3. Nonobstructing stone in the lower pole the right kidney. 4. Calcified uterine fibroids. Electronically Signed   By: Lajean Manes M.D.   On: 12/23/2022 16:05   CT ABDOMEN PELVIS WO CONTRAST  Result Date: 12/23/2022 CLINICAL DATA:  Respiratory  illness. Sepsis. Chest pain and shortness of breath since this morning. EXAM: CT CHEST, ABDOMEN AND PELVIS WITHOUT CONTRAST TECHNIQUE: Multidetector CT imaging of the chest, abdomen and pelvis was performed following the standard protocol without IV contrast. RADIATION DOSE REDUCTION: This exam was performed according to the departmental dose-optimization program which includes automated exposure control, adjustment of the mA and/or kV according to patient size and/or use of iterative reconstruction technique. COMPARISON:  06/28/2022. FINDINGS: CT CHEST FINDINGS Cardiovascular: Heart normal in size and configuration. No pericardial effusion. Minimal left coronary artery calcifications. Great vessels are normal in caliber. Mild aortic atherosclerotic calcifications. Mediastinum/Nodes: No enlarged mediastinal, hilar, or axillary lymph nodes. Thyroid gland, trachea, and esophagus demonstrate no significant findings. Lungs/Pleura: Lungs are clear. No pleural effusion or pneumothorax. Musculoskeletal: No fracture or acute finding. No bone lesion. No chest wall mass. CT ABDOMEN PELVIS FINDINGS Hepatobiliary: No focal liver abnormality is seen. Status post cholecystectomy. No biliary dilatation. Pancreas: Unremarkable. No pancreatic ductal dilatation or surrounding inflammatory changes. Spleen: Normal in size without focal abnormality. Adrenals/Urinary Tract: Normal adrenal glands. 4 mm stone, lower pole the right kidney. No renal masses, other stones or hydronephrosis. Ureters are normal in course and in caliber. Bladder is unremarkable. Stomach/Bowel: Well positioned lap band at the gastroesophageal junction. This is stable. Stomach unremarkable. Small bowel and colon are normal in caliber. No wall thickening. No inflammation. Vascular/Lymphatic: Aortic atherosclerotic calcifications. No aneurysm. No enlarged lymph nodes. Reproductive: Densely calcified exophytic fibroid extends toward the left adnexa from the left upper  uterine segment, 6 cm in size, unchanged. Smaller uterine calcifications suggest additional small fibroids. No adnexal masses. Other: No abdominal wall hernia or abnormality. No abdominopelvic ascites. Musculoskeletal: No fracture or acute finding.  No bone lesion. IMPRESSION: 1. No acute findings within the chest, abdomen or pelvis. No evidence of infection. Findings stable from the prior abdomen and pelvis CT from 06/28/2022. 2. Aortic atherosclerosis. 3. Nonobstructing stone in the lower pole the right kidney. 4. Calcified uterine fibroids. Electronically Signed   By: Lajean Manes M.D.   On: 12/23/2022 16:05   DG Chest Port 1 View  Result Date: 12/23/2022 CLINICAL DATA:  Shortness of breath and chest pain EXAM: PORTABLE CHEST 1 VIEW COMPARISON:  11/20/2019 FINDINGS: Cardiac enlargement and aortic atherosclerosis. Low lung volumes. The left costophrenic angle is ups cured in there may be a small left effusion. Subsegmental atelectasis noted in the right base. No frank interstitial edema. IMPRESSION: 1. Suspect small left pleural effusion. 2. Low lung volumes. Electronically Signed   By: Kerby Moors M.D.   On: 12/23/2022 13:23   CARDIAC CATHETERIZATION  Result Date: 12/20/2022  Findings: RA = 6 RV = 45/7 PA = 42/10 (24) PCW = 9 Fick cardiac output/index = 6.4/3.2 Thermo CO/CI = 5.6/2.8 PVR = 2.3 (Fick) 2.7 (TD) Ao sat = 96% PA sat = 69%, 69% Assessment: 1. Mild pulmonary HTN with mildly elevated PVR Plan/Discussion: Hemodynamics improved from previous. Continue current therapy for now. Glori Bickers, MD 2:00 PM   Microbiology: Results for orders placed or performed during the hospital encounter of 12/23/22  Resp panel by RT-PCR (RSV, Flu A&B, Covid) Anterior Nasal Swab     Status: None   Collection Time: 12/23/22 12:25 PM   Specimen: Anterior Nasal Swab  Result Value Ref Range Status   SARS Coronavirus 2 by RT PCR NEGATIVE NEGATIVE Final    Comment: (NOTE) SARS-CoV-2 target nucleic acids  are NOT DETECTED.  The SARS-CoV-2 RNA is generally detectable in upper respiratory specimens during the acute phase of infection. The lowest concentration of SARS-CoV-2 viral copies this assay can detect is 138 copies/mL. A negative result does not preclude SARS-Cov-2 infection and should not be used as the sole basis for treatment or other patient management decisions. A negative result may occur with  improper specimen collection/handling, submission of specimen other than nasopharyngeal swab, presence of viral mutation(s) within the areas targeted by this assay, and inadequate number of viral copies(<138 copies/mL). A negative result must be combined with clinical observations, patient history, and epidemiological information. The expected result is Negative.  Fact Sheet for Patients:  EntrepreneurPulse.com.au  Fact Sheet for Healthcare Providers:  IncredibleEmployment.be  This test is no t yet approved or cleared by the [place] FDA and  has been authorized for detection and/or diagnosis of SARS-CoV-2 by FDA under an Emergency Use Authorization (EUA). This EUA will remain  in effect (meaning this test can be used) for the duration of the COVID-19 declaration under Section 564(b)(1) of the Act, 21 U.S.C.section 360bbb-3(b)(1), unless the authorization is terminated  or revoked sooner.       Influenza A by PCR NEGATIVE NEGATIVE Final   Influenza B by PCR NEGATIVE NEGATIVE Final    Comment: (NOTE) The Xpert Xpress SARS-CoV-2/FLU/RSV plus assay is intended as an aid in the diagnosis of influenza from Nasopharyngeal swab specimens and should not be used as a sole basis for treatment. Nasal washings and aspirates are unacceptable for Xpert Xpress SARS-CoV-2/FLU/RSV testing.  Fact Sheet for Patients: EntrepreneurPulse.com.au  Fact Sheet for Healthcare Providers: IncredibleEmployment.be  This test is  not yet approved or cleared by the [place] FDA and has been authorized for detection and/or diagnosis of SARS-CoV-2 by FDA under an Emergency Use Authorization (EUA). This EUA will remain in effect (meaning this test can be used) for the duration of the COVID-19 declaration under Section 564(b)(1) of the Act, 21 U.S.C. section 360bbb-3(b)(1), unless the authorization is terminated or revoked.     Resp Syncytial Virus by PCR NEGATIVE NEGATIVE Final    Comment: (NOTE) Fact Sheet for Patients: EntrepreneurPulse.com.au  Fact Sheet for Healthcare Providers: IncredibleEmployment.be  This test is not yet approved or cleared by the [place] FDA and has been authorized for detection and/or diagnosis of SARS-CoV-2 by FDA under an Emergency Use Authorization (EUA). This EUA will remain in effect (meaning this test can be used) for the duration of the COVID-19 declaration under Section 564(b)(1) of the Act, 21 U.S.C. section 360bbb-3(b)(1), unless the authorization is terminated or revoked.  Performed at [facility], [address], [place], [place] [address]   Blood Culture (routine x  2)     Status: None (Preliminary result)   Collection Time: 12/23/22  2:10 PM   Specimen: BLOOD RIGHT ARM  Result Value Ref Range Status   Specimen Description BLOOD RIGHT ARM  Final   Special Requests   Final    BOTTLES DRAWN AEROBIC AND ANAEROBIC Blood Culture results may not be optimal due to an excessive volume of blood received in culture bottles   Culture   Final    NO GROWTH 3 DAYS Performed at [facility], [address], [place], [place] [address]    Report Status PENDING  Incomplete  Blood Culture (routine x 2)     Status: None (Preliminary result)   Collection Time: 12/23/22  2:10 PM   Specimen: BLOOD LEFT ARM  Result Value Ref Range Status   Specimen Description BLOOD LEFT ARM  Final   Special Requests   Final    BOTTLES DRAWN AEROBIC AND  ANAEROBIC Blood Culture results may not be optimal due to an excessive volume of blood received in culture bottles   Culture   Final    NO GROWTH 3 DAYS Performed at [facility], [address], [place], [place] [address]    Report Status PENDING  Incomplete    Labs: CBC: Recent Labs  Lab 12/20/22 1337 12/23/22 1300 12/24/22 0504  WBC  --  14.9* 12.1*  NEUTROABS  --  13.6*  --   HGB 11.2*  11.9* 11.5* 10.8*  HCT 33.0*  35.0* 35.7* 32.8*  MCV  --  102.0* 100.0  PLT  --  289 098   Basic Metabolic Panel: Recent Labs  Lab 12/20/22 1337 12/23/22 1300 12/24/22 0504 12/25/22 0459 12/26/22 0417  NA 147*  144 143 145 140 139  K 3.6  4.0 4.0 3.4* 3.9 3.7  CL  --  109 110 103 101  CO2  --  _0 GLUCOSE  --  186* 114* 98 96  BUN  --  32* 29* 30* 31*  CREATININE  --  2.00* 1.76* 2.07* 2.13*  CALCIUM  --  9.2 9.1 9.1 9.1  MG  --   --   --  1.8  --   PHOS  --   --   --  4.0  --    Liver Function Tests: Recent Labs  Lab 12/23/22 1300  AST 24  ALT 16  ALKPHOS 45  BILITOT 0.4  PROT 7.5  ALBUMIN 3.8   CBG: Recent Labs  Lab 12/25/22 0731 12/25/22 1137 12/25/22 1634 12/25/22 2051 12/26/22 0730  GLUCAP 121* 136* 119* 106* 142*    Discharge time spent: greater than 30 minutes.  Signed: Orson Eva, MD [facility] 12/26/2022

## 2022-12-26 NOTE — Progress Notes (Signed)
Patient rested quietly through the night no pain or discomfort voiced this shift, BSC in place, and call bell within reach.

## 2022-12-26 NOTE — Progress Notes (Signed)
Discharge instructions given. Patient verbalized understanding.

## 2022-12-28 LAB — CULTURE, BLOOD (ROUTINE X 2)
Culture: NO GROWTH
Culture: NO GROWTH

## 2023-01-03 DIAGNOSIS — R6889 Other general symptoms and signs: Secondary | ICD-10-CM | POA: Diagnosis not present

## 2023-01-07 NOTE — Progress Notes (Signed)
ADVANCED HF CLINIC NOTE   Patient ID: Sara Tran, female   DOB: 10/04/1956, 67 y.o.   MRN: [id number] Primary Care: Dr. Phillips Odor Primary Cardiologist: Dr. Gala Romney  Nephrologist: Dr. Fausto Skillern  HPI: [name] is a 67 y.o.  female with history of mild pulmonary hypertension, diabetes, HF, COPD, sleep apnea with CPAP, elevated triglycerides, renal insufficiency and morbid obesity s/p lap band in 2011 and CP with normal coronaries by cath in 2013 and 3/21  Echo 10/2017: EF 55-60% RV normal   In 3/21 was complaining of chest pain and concerned about worsening PAH and/or ischemic heart disease.  Had a repeat R/LHC with normal coronaries and stable mild PAH.   Admitted 11/23 with a/c CHF and a/c COPD. Given IV lasix and IV steroids. GDMT limited by kyperkalemia and need for [facility]. Echo showed EF 60-65%, moderate LVH, grade I DD, RV-RA gradient 70 mmHg consistent with severe pulm htn. Discharged home, weight 245 lbs  Post hospital follow up 11/23, concern over worsening pulmonary hypertension. RHC arranged and showed mild pulmonary hypertension, mildly elevated PVR. No indication for pulmonary vasodilators.  S/p endometrial bx showing adenocarcinoma. Referred to GYN onc  Admitted 12/23 with CP and SOB. Had not been taking Lasix and had 7 lb weight gain. Lasix resumed. WBC elevated 14.9, CT a/p negative, ua and blood cx negative.  Discharged home, weight 241 lbs.  Today she returns for post cath HF follow up. Overall feeling poorly. She is SOB walking on flat ground. Legs are swelling, has dizziness. Continues with chronic chest pain, taking SL nitro. Having frequent bowel movements and noticed some bleeding when wiping. Appetite ok. No fever or chills. Weight at home 248 pounds. Taking all medications. She increased torsemide to 40 bid x 5 days. Drinks about 32 oz fluid/day. Has GYN/ONC follow up this week. She is concerned her cancer has metastasized.  Cardiac studies:  - RHC (12/23): mild  pulmonary hypertension with mildly elevated PVR   RA: 6 RV: 45/7 PA: 42/10 (24) PCWP: 9 CO/CI (Fick): 6.4/3.2 PVR: 2.3 (Fick) 2.7 (TD)  - Echo (11/23): EF 60-65%, moderate LVH, RV-RA gradient 70 mmHg, RV systolic function OK  - L/RHC (3/21):  Ao = 178/79 (119) LV  = 168/16 RA = 9   RV = 41/11 PA = 42/17 (29) PCW = 13 Fick cardiac output/index = 5.3/2.4 PVR = 3.0 WU FA sat = 99% PA sat = 69%, 70%   - LHC (05/2012): normal cors  - Dr. Juanetta Gosling performed PFTs on 03/13/12 which were normal. DLCO is minimally reduced, but is corrected to some extent when considerations of volume are made.  FVC 1.91 (81%), FEV1 (88%), FEV1/FVC 87 (107%), FEF 25-75% 2.09 (103%), DLCO 75%  - VQ scan (2008): normal   - RHC (2008):  RA pressures 13/10 with a mean of 7 mmHg.  RV pressure 57/12 with end diastolic   Review of systems complete and found to be negative unless listed in HPI.    Past Medical History:  Diagnosis Date   Adnexal cyst    Right simple cyst seen on [place]   Anginal pain (HCC)    Anxiety    Arthritis    Asthma    Atrial fibrillation (HCC)    hx of    CHF (congestive heart failure) (HCC)    Chronic back pain    Chronic hip pain    COPD (chronic obstructive pulmonary disease) (HCC)    Coronary artery disease    stents placed  Dyspnea    GERD (gastroesophageal reflux disease)    Headache    due to pinched nerve damaage    Heart murmur    hx of slight murmur    History of bronchitis    History of cardiac catheterization    Normal coronaries 2013   History of kidney stones    Hot flashes    Hypertension    Hypertriglyceridemia    IBS (irritable bowel syndrome)    Internal hemorrhoids    Kidney disease    Morbid obesity (HCC)    s/p lap band surgery   Myocardial infarction (HCC) 12/2018   times 3   Obesity    Pulmonary HTN (HCC)    RHC 2008:  RA pressures 13/10 with a mean of 7 mmHg.  RV pressure 57/12 with end diastolic pressure of 15 mmHg.  PA pressure 49/23  with a mean of 35 mmHg.  Pulmonary capillary wedge 17/50 with a mean of 14 mmHg. The PA saturation was 68%.  RA saturation was 71% and aortic saturation was 90%.  Cardiac output was 6.0 with a cardiac index of 2.80 by Fick.  Normal coronaries by cath 2008.   Renal insufficiency    Sciatica of right side    Sleep apnea    CPAP use does not know settings    Trauma    Type 2 diabetes mellitus (HCC)    type II    Uterine fibroid    Current Outpatient Medications  Medication Sig Dispense Refill   albuterol (PROVENTIL) (2.5 MG/3ML) 0.083% nebulizer solution Take 2.5 mg by nebulization every 4 (four) hours as needed for wheezing or shortness of breath.      albuterol (VENTOLIN HFA) 108 (90 Base) MCG/ACT inhaler Inhale 2 puffs into the lungs every 6 (six) hours as needed for wheezing or shortness of breath. 18 g 0   ALPRAZolam (XANAX) 0.5 MG tablet Take 0.5 mg by mouth at bedtime as needed for anxiety or sleep.     amLODipine (NORVASC) 10 MG tablet Take 10 mg by mouth every evening.      Ascorbic Acid (VITAMIN C) 500 MG CAPS Take 500 mg by mouth daily.     baclofen (LIORESAL) 10 MG tablet TAKE 1 TO 2 TABLETS FOUR TIMES DAILY (Patient taking differently: Take 5-10 tablets by mouth 4 (four) times daily.) 720 tablet 3   Blood Glucose Calibration (TRUE METRIX LEVEL 2) Normal SOLN      Blood Glucose Monitoring Suppl (TRUE METRIX AIR GLUCOSE METER) w/Device KIT      Cholecalciferol (DIALYVITE VITAMIN D 5000) 125 MCG (5000 UT) capsule Take 5,000 Units by mouth once a week.     clobetasol (TEMOVATE) 0.05 % external solution Apply 1 application  topically 2 (two) times daily.     EPINEPHrine 0.3 mg/0.3 mL IJ SOAJ injection Inject 0.3 mg into the muscle as needed for anaphylaxis.     fenofibrate 160 MG tablet Take 160 mg by mouth daily.     fluticasone (FLONASE) 50 MCG/ACT nasal spray Place 1 spray into both nostrils daily.      gabapentin (NEURONTIN) 100 MG capsule Take 2 capsules (200 mg total) by mouth 4  (four) times daily. 240 capsule 1   isosorbide mononitrate (IMDUR) 30 MG 24 hr tablet Take 30 mg by mouth daily.      levocetirizine (XYZAL) 5 MG tablet Take 5 mg by mouth at bedtime.     linaclotide (LINZESS) 72 MCG capsule Take 1 capsule (72 mcg total)  by mouth daily before breakfast. 90 capsule 3   magnesium oxide (MAG-OX) 400 (240 Mg) MG tablet Take 400 mg by mouth daily.     Multiple Vitamin (MULTIVITAMIN WITH MINERALS) TABS tablet Take 1 tablet by mouth daily.     nitroGLYCERIN (NITROSTAT) 0.4 MG SL tablet Place 0.4 mg under the tongue every 5 (five) minutes as needed for chest pain.     ondansetron (ZOFRAN) 4 MG tablet Take 4 mg by mouth every 8 (eight) hours as needed for nausea or vomiting.      oxybutynin (DITROPAN-XL) 10 MG 24 hr tablet Take 1 tablet (10 mg total) by mouth daily as needed.     Oxycodone HCl 10 MG TABS Take 10 mg by mouth every 6 (six) hours as needed (pain).     potassium chloride SA (K-DUR) 20 MEQ tablet Take 20 mEq by mouth daily.      pravastatin (PRAVACHOL) 40 MG tablet Take 40 mg by mouth daily.     RESTASIS 0.05 % ophthalmic emulsion Place 1 drop into both eyes 2 (two) times daily.     SYMBICORT 160-4.5 MCG/ACT inhaler Inhale 2 puffs into the lungs 2 (two) times daily. 30.6 g 3   Torsemide 40 MG TABS Take 80 mg by mouth daily.     TRUE METRIX BLOOD GLUCOSE TEST test strip      TRUEplus Lancets 33G MISC      TRULICITY 1.5 MG/0.5ML SOPN Inject 1.5 mg into the skin every 7 (seven) days.     No current facility-administered medications for this encounter.   Allergies  Allergen Reactions   Ancef [Cefazolin] Anaphylaxis and Shortness Of Breath        Bee Venom Anaphylaxis    As a child   Meloxicam     Gi upset    Nsaids Other (See Comments)    Rectal bleeding, stopped heart    Ceftin [Cefuroxime Axetil] Swelling    Swollen tongue   Chicken Meat (Diagnostic) Nausea And Vomiting   Ciprofloxacin Cough    Coughed up blood   Macrodantin [Nitrofurantoin]  Other (See Comments)    Vomited blood and had dark stool.   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 [place]  Tobacco Use   Smoking status: Former    Types: Cigarettes    Quit date: 08/08/1996    Years since quitting: 26.4   Smokeless tobacco: Never  Vaping Use   Vaping Use: Never used  Substance and Sexual Activity   Alcohol use: No   Drug use: No   Sexual activity: Not Currently    Birth control/protection: Post-menopausal  Other Topics Concern   Not on file  Social History Narrative   Not on file   Social Determinants of Health   Financial Resource Strain: Medium Risk (10/09/2022)   Overall Financial Resource Strain (CARDIA)    Difficulty of Paying Living Expenses: Somewhat hard  Food Insecurity: Food Insecurity Present (10/09/2022)  Hunger Vital Sign    Worried About Running Out of Food in the Last Year: Sometimes true    Ran Out of Food in the Last Year: Sometimes true  Transportation Needs: Unmet Transportation Needs (10/09/2022)   PRAPARE - Administrator, Civil Service (Medical): Yes    Lack of Transportation (Non-Medical): Yes  Physical Activity: Insufficiently Active (10/09/2022)   Exercise Vital Sign    Days of Exercise per Week: 2 days    Minutes of Exercise per Session: 10 min  Stress: Stress Concern Present (10/09/2022)   [employer] of Occupational Health - Occupational Stress Questionnaire    Feeling of Stress : To some extent  Social Connections: Moderately Isolated (10/09/2022)    Social Connection and Isolation Panel [NHANES]    Frequency of Communication with Friends and Family: Once a week    Frequency of Social Gatherings with Friends and Family: Once a week    Attends Religious Services: More than 4 times per year    Active Member of [employer] or Organizations: Yes    Attends Banker Meetings: 1 to 4 times per year    Marital Status: Never married   Family History  Problem Relation Age of Onset   Cancer Paternal Grandfather    Hypertension Paternal Grandmother    Stroke Paternal Grandmother    Other Maternal Grandmother        tonsilitis   Diabetes Maternal Grandfather    Hypertension Maternal Grandfather    CAD Father    Hypertension Father    Heart failure Father    Kidney disease Father    Diabetes Mother    Heart failure Mother    Breast cancer Sister    Breast cancer Paternal Aunt    Wt Readings from Last 3 Encounters:  01/08/23 112.5 kg (248 lb)  12/25/22 110.1 kg (242 lb 11.6 oz)  12/20/22 112 kg (247 lb)   BP 118/64   Pulse 90   Wt 112.5 kg (248 lb)   LMP 06/06/2013   SpO2 96%   BMI 48.43 kg/m   PHYSICAL EXAM: General:  NAD. No resp difficulty, arrived WC HEENT: Normal Neck: Supple. No JVD, thick neck. Carotids 2+ bilat; no bruits. No lymphadenopathy or thryomegaly appreciated. Cor: PMI nondisplaced. Regular rate & rhythm. No rubs, gallops or murmurs. Lungs: Clear Abdomen: Obese,  nontender, nondistended. No hepatosplenomegaly. No bruits or masses. Good bowel sounds. Extremities: No cyanosis, clubbing, rash, 1+ BLE pre-tibial edema Neuro: Alert & oriented x 3, cranial nerves grossly intact. Moves all 4 extremities w/o difficulty. Affect pleasant.  ASSESSMENT & PLAN: 1. Pulmonary hypertension:  - Patient had mild pulmonary hypertension on RHC in 6/13.  - Echo (1/16) with mild RVH and Grade 1 DD, otherwise unremarkable.  - Cath 3/21 with mild PAH. Suggest weight loss. - Suspect WHO group 2&3 - No role for selective  pulmonary vasodilators  - Echo 11/23 showed EF 60-65%, RV ok., RV-RA gradient 70 mmHg - RHC 12/23 RA 6, PA 42/10 (24), PCWP 9, CO/CI 6.4/3.2, PVR 2.3 - Needs weight loss, continue CPAP. - Continue current dose of torsemide 40 mg bid, take extra 40 mg for the next 3 days. - Given new Rx for compression hose. - Hold off on SGLT2i with frequent yeast infections and large pannus. - Labs today.  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  3. History of CP with dyspnea - Cath 2013 without CAD - Cath  3/21 without CAD, mild Pulm HTN - CP appears to be her chronic pattern. - Continue Imdur  4. OSA - Continue CPAP. - She followed with Dr. Vassie Loll.  5. Hypertension - Controlled. - Continue meds as above.  6. DM2 - Per PCP.   7. Obesity - Body mass index is 48.43 kg/m. - Continue weight loss efforts.  - She is taking GLP1RA.  8. Endometrial adenocarcinoma - s/p uterine bx 12/23 - She has follow up with GYN ONC this week.  Keep follow up with Dr. Gala Romney, as scheduled.  Prince Rome, FNP-BC 01/08/23

## 2023-01-08 ENCOUNTER — Encounter (HOSPITAL_COMMUNITY): Payer: Self-pay

## 2023-01-08 ENCOUNTER — Ambulatory Visit (HOSPITAL_COMMUNITY)
Admission: RE | Admit: 2023-01-08 | Discharge: 2023-01-08 | Disposition: A | Discharge status: Home / Self Care | Payer: Medicare HMO | Source: Ambulatory Visit | Attending: Family Medicine | Admitting: Family Medicine

## 2023-01-08 VITALS — BP 118/64 | HR 90 | Wt 248.0 lb

## 2023-01-08 DIAGNOSIS — Z87891 Personal history of nicotine dependence: Secondary | ICD-10-CM | POA: Diagnosis not present

## 2023-01-08 DIAGNOSIS — Z79899 Other long term (current) drug therapy: Secondary | ICD-10-CM | POA: Diagnosis not present

## 2023-01-08 DIAGNOSIS — Z7985 Long-term (current) use of injectable non-insulin antidiabetic drugs: Secondary | ICD-10-CM | POA: Diagnosis not present

## 2023-01-08 DIAGNOSIS — R0789 Other chest pain: Secondary | ICD-10-CM | POA: Diagnosis not present

## 2023-01-08 DIAGNOSIS — I509 Heart failure, unspecified: Secondary | ICD-10-CM | POA: Insufficient documentation

## 2023-01-08 DIAGNOSIS — J449 Chronic obstructive pulmonary disease, unspecified: Secondary | ICD-10-CM | POA: Insufficient documentation

## 2023-01-08 DIAGNOSIS — G4733 Obstructive sleep apnea (adult) (pediatric): Secondary | ICD-10-CM

## 2023-01-08 DIAGNOSIS — C541 Malignant neoplasm of endometrium: Secondary | ICD-10-CM

## 2023-01-08 DIAGNOSIS — R079 Chest pain, unspecified: Secondary | ICD-10-CM | POA: Diagnosis not present

## 2023-01-08 DIAGNOSIS — Z9884 Bariatric surgery status: Secondary | ICD-10-CM | POA: Diagnosis not present

## 2023-01-08 DIAGNOSIS — R6889 Other general symptoms and signs: Secondary | ICD-10-CM | POA: Diagnosis not present

## 2023-01-08 DIAGNOSIS — Z6841 Body Mass Index (BMI) 40.0 and over, adult: Secondary | ICD-10-CM | POA: Insufficient documentation

## 2023-01-08 DIAGNOSIS — Z794 Long term (current) use of insulin: Secondary | ICD-10-CM | POA: Diagnosis not present

## 2023-01-08 DIAGNOSIS — N183 Chronic kidney disease, stage 3 unspecified: Secondary | ICD-10-CM | POA: Diagnosis not present

## 2023-01-08 DIAGNOSIS — G8929 Other chronic pain: Secondary | ICD-10-CM | POA: Diagnosis not present

## 2023-01-08 DIAGNOSIS — I272 Pulmonary hypertension, unspecified: Secondary | ICD-10-CM

## 2023-01-08 DIAGNOSIS — I130 Hypertensive heart and chronic kidney disease with heart failure and stage 1 through stage 4 chronic kidney disease, or unspecified chronic kidney disease: Secondary | ICD-10-CM | POA: Insufficient documentation

## 2023-01-08 DIAGNOSIS — I10 Essential (primary) hypertension: Secondary | ICD-10-CM

## 2023-01-08 DIAGNOSIS — E781 Pure hyperglyceridemia: Secondary | ICD-10-CM | POA: Diagnosis not present

## 2023-01-08 DIAGNOSIS — E1122 Type 2 diabetes mellitus with diabetic chronic kidney disease: Secondary | ICD-10-CM | POA: Insufficient documentation

## 2023-01-08 LAB — BASIC METABOLIC PANEL
Anion gap: 10 (ref 5–15)
BUN: 55 mg/dL — ABNORMAL HIGH (ref 8–23)
CO2: 24 mmol/L (ref 22–32)
Calcium: 9.2 mg/dL (ref 8.9–10.3)
Chloride: 103 mmol/L (ref 98–111)
Creatinine, Ser: 2.71 mg/dL — ABNORMAL HIGH (ref 0.44–1.00)
GFR, Estimated: 19 mL/min — ABNORMAL LOW (ref >60)
Glucose, Bld: 127 mg/dL — ABNORMAL HIGH (ref 70–99)
Potassium: 4.9 mmol/L (ref 3.5–5.1)
Sodium: 137 mmol/L (ref 135–145)

## 2023-01-08 LAB — BRAIN NATRIURETIC PEPTIDE: B Natriuretic Peptide: 62.4 pg/mL (ref 0.0–100.0)

## 2023-01-08 MED ORDER — TORSEMIDE 40 MG PO TABS: ORAL_TABLET | ORAL | 6 refills | Status: DC

## 2023-01-08 NOTE — Patient Instructions (Signed)
Please take an additional 40 mg of torsemide for 3 days (120 mg total), then resume normal dose of 80 mg daily thereafter  Labs today We will only contact you if something comes back abnormal or we need to make some changes. Otherwise no news is good news!  Please wear your compression hose daily, place them on as soon as you get up in the morning and remove before you go to bed at night.  Keep cardiology follow up as scheduled  Do the following things EVERYDAY: Weigh yourself in the morning before breakfast. Write it down and keep it in a log. Take your medicines as prescribed Eat low salt foods--Limit salt (sodium) to 2000 mg per day.  Stay as active as you can everyday Limit all fluids for the day to less than 2 liters  At the [facility], you and your health needs are our priority. As part of our continuing mission to provide you with exceptional heart care, we have created designated Provider Care Teams. These Care Teams include your primary Cardiologist (physician) and Advanced Practice Providers (APPs- Physician Assistants and Nurse Practitioners) who all work together to provide you with the care you need, when you need it.   You may see any of the following providers on your designated Care Team at your next follow up: Dr Glori Bickers Dr Loralie Champagne Dr. Roxana Hires, NP Lyda Jester, [place] [facility] [place], [place] Forestine Na, NP Audry Riles, PharmD   Please be sure to bring in all your medications bottles to every appointment.   If you have any questions or concerns before your next appointment please send [place] a message through [place] or call our office at [phone].    TO LEAVE A MESSAGE FOR THE NURSE SELECT OPTION 2, PLEASE LEAVE A MESSAGE INCLUDING: YOUR NAME DATE OF BIRTH CALL BACK NUMBER REASON FOR CALL**this is important as we prioritize the call backs  YOU WILL RECEIVE A CALL BACK THE SAME DAY AS LONG AS  YOU CALL BEFORE 4:00 PM

## 2023-01-09 DIAGNOSIS — Z1331 Encounter for screening for depression: Secondary | ICD-10-CM | POA: Diagnosis not present

## 2023-01-09 DIAGNOSIS — G894 Chronic pain syndrome: Secondary | ICD-10-CM | POA: Diagnosis not present

## 2023-01-09 DIAGNOSIS — Z6841 Body Mass Index (BMI) 40.0 and over, adult: Secondary | ICD-10-CM | POA: Diagnosis not present

## 2023-01-09 DIAGNOSIS — Z0001 Encounter for general adult medical examination with abnormal findings: Secondary | ICD-10-CM | POA: Diagnosis not present

## 2023-01-10 ENCOUNTER — Encounter: Payer: Self-pay | Admitting: Gynecologic Oncology

## 2023-01-10 ENCOUNTER — Encounter (HOSPITAL_COMMUNITY): Payer: Self-pay

## 2023-01-10 NOTE — Progress Notes (Signed)
GYNECOLOGIC ONCOLOGY NEW PATIENT CONSULTATION   Patient Name: Sara Tran  Patient Age: 67 y.o. Date of Service: 01/11/23 Referring Provider: Otis Peak, MD  Primary Care Provider: Sharilyn Sites, MD Consulting Provider: Jeral Pinch, MD   Assessment/Plan:  Postmenopausal patient with low-grade endometrial cancer.  I spent some time speaking with the Patient about recent biopsy and diagnosis of endometrial cancer. We reviewed the nature of endometrial cancer and its recommended surgical staging, including total hysterectomy, bilateral salpingo-oophorectomy, and lymph node assessment.  Given her multiple comorbidities, specifically her pulmonary disease, pulmonary hypertension, and CHF (with 2 recent admissions for acute on chronic heart failure), I am concerned about her ability to tolerate major surgery.  Robotic hysterectomy would require insufflation of her abdomen and Trendelenburg position for at least 2 hours.  We discussed that most endometrial cancer is detected early and that decisions regarding adjuvant therapy will be made based on her final pathology.  The patient voiced concern about metastatic disease.  I reviewed that her recent CT scan from admission in December did not show obvious evidence of cancer metastasis.  In the setting of patients who are not candidate for major surgery (either by comorbidity or because they do not tolerate Trendelenburg ), we reviewed the role of progesterone therapy and the effect on preneoplastic neoplastic lesions, believed to include induction of apoptosis in addition to tissue sloughing during withdrawal bleeding.  Activation of the progesterone receptors is believed to lead stromal decidualization and thinning of the lining.  We reviewed that progesterone therapy can be given orally with high-dose progesterone or using a Mirena IUD.  I reviewed side effects of oral progesterone including lower extremity edema, GI disturbances, increased  appetite, and thromboembolic events.  Local therapy with a progesterone IUD appears to have similar if not better response rates in endometrial epithelial neoplasia and grade 1 endometrioid cancers.   If the patient is not cleared for surgery by her specialists or if at the time of planned surgery she does not tolerate a tilt test, my recommendation would be to proceed with dilation and curettage and Mirena IUD placement.  Initially during our discussion of this, the patient voiced wanting to proceed with hysterectomy even if it meant "dying on the table".  Given alternative treatment options, I stated that I was not comfortable with proceeding with a surgery that I thought could cause significant harm or possibly death.   We discussed the average duration to regression in the setting of EIN and endometrial cancer with an IUD use.  We also discussed that for monitoring, there is no standard of care but that I would recommend basing surveillance on the GOG 224 protocol.  This would include endometrial biopsies at 44-monthintervals following initiation of treatment until a minimum of 3 negative biopsy results, after which sampling frequency could be spaced out.  If persistent endometrial cancer is noted at 6-9 months after IUD placement, then we would discuss fitness/readiness for surgery, possible addition of other progesterone agents, or consideration of radiation treatment.  I discussed the role of excess estrogen and the development of low-grade endometrioid cancers.  We also reviewed the importance of weight loss in overall health as well as decreasing risk at the time of major surgery.   The patient was scheduled for a return visit later in February with a tentative surgical date of 2/21.  If cleared for hysterectomy, plan would be for robotic assisted hysterectomy with BSO, possible sentinel lymph node evaluation, possible lymph node dissection.  Would also plan for D&C and Mirena IUD if she failed a  tilt test or is not cleared for major surgery by her other physicians.  Nurse navigator, Santiago Glad, met with the patient to review list of doctors including cardiologist, pulmonologist, PCP, and nephrologist.  We will reach out to all of their offices for surgical clearance.  A copy of this note was sent to the patient's referring provider.   75 minutes of total time was spent for this patient encounter, including preparation, face-to-face counseling with the patient and coordination of care, and documentation of the encounter.  Jeral Pinch, MD  Division of Gynecologic Oncology  Department of Obstetrics and Gynecology  [facility]  ___________________________________________  Chief Complaint: Chief Complaint  Patient presents with   Endometrial cancer [facility])    History of Present Illness:  Sara Tran is a 67 y.o. y.o. female who is seen in consultation at the request of Dr. Nelda Marseille for an evaluation of endometrial cancer.  Patient reports having an episode of bleeding over the summer last year.  She saw blood in the toilet.  This was a limited amount of bleeding.  Bleeding then stopped but she had another episode or 2 in October or November.  She describes some foul smell to the bleeding but also notes having foul-smelling discharge coming from underneath her pannus in her breasts.  She denies any vaginal discharge.  She was seen by OB/GYN in October. Pelvic ultrasound was performed on 10/15/2022.  Findings included uterus measuring 6.4 x 3.6 x 6.7 cm.  Large hyperechoic focus with acoustic shadowing measures up to 5.9 x 6.6 cm on the left side of the uterus, corresponding to densely calcified lesion on prior CT scan.  Endometrial lining is difficult to visualize but measures 3 mm without increased vascularity.  Impression is that uterus contains a calcified fibroid to the left.  Ovaries not sonographically visualized.  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  CT C/A/P on 12/23/22 during recent admission for acute exacerbation of her heart failure revealed no acute findings, no evidence of infection.  Calcified uterine fibroids.  No adenopathy.  Today, the patient denies any vaginal bleeding since her biopsy.  She struggles with baseline constipation, uses Linzess.  Constipation seems to be diet related.  She denies any urinary symptoms although notes that she does not urinate much.  She endorses decreased appetite.  Has struggled with nausea since last summer, related to diabetes.  Checks her blood sugars at home and these generally run in the 130s.  She is on weekly Trulicity.  She was just discharged from the hospital on 12/27 after she presented with chest pain and shortness of breath.  Her history is notable for congestive heart failure.  Last echo was in November, EF 60-65%.  Patient has severe pulmonary hypertension.  Medical history also complicated by type 2 diabetes, obesity, obstructive sleep apnea (uses CPAP).  She also has chronic kidney disease, more recent creatinine since 2021 has hovered around or above 2.  PAST MEDICAL HISTORY:  Past Medical History:  Diagnosis Date   Adnexal cyst    Right simple cyst seen on [place]   Anginal pain (HCC)    Anxiety    Arthritis    Asthma    Atrial fibrillation (HCC)    hx of    CHF (congestive heart failure) (HCC)    Chronic back pain    Chronic hip pain  COPD (chronic obstructive pulmonary disease) (HCC)    Coronary artery disease    stents placed    Dyspnea    GERD (gastroesophageal reflux disease)    Headache    due to pinched nerve damaage    Heart murmur    hx of slight murmur    History of bronchitis    History of cardiac catheterization    Normal coronaries 2013   History of kidney stones    Hot flashes    Hypertension    Hypertriglyceridemia    IBS (irritable bowel syndrome)    Internal hemorrhoids    Kidney  disease    Morbid obesity ([place])    s/p lap band surgery   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  RV pressure 43/15 with end diastolic pressure of 15 mmHg.  PA pressure 49/23 with a mean of 35 mmHg.  Pulmonary capillary wedge 17/50 with a mean of 14 mmHg. The PA saturation was 68%.  RA saturation was 71% and aortic saturation was 90%.  Cardiac output was 6.0 with a cardiac index of 2.80 by Fick.  Normal coronaries by cath 2008.   Renal insufficiency    Sciatica of right side    Sleep apnea    CPAP use does not know settings    Trauma    Type 2 diabetes mellitus ([place])    type II    Uterine fibroid      PAST SURGICAL HISTORY:  Past Surgical History:  Procedure Laterality Date   accupuncture     APPENDECTOMY     CARDIAC CATHETERIZATION  07/2007, 05/2012   Normal coronary arteries   COLONOSCOPY  10/18/2010   SLF:2-mm sessile sigmoid polyp/diverticula, tortuous colon.  Biopsies of sigmoid colon polyp and random colon biopsies benign   COLONOSCOPY WITH PROPOFOL N/A 08/09/2020   Procedure: COLONOSCOPY WITH PROPOFOL;  Surgeon: Eloise Harman, DO;  [facility];  Service: Endoscopy;  Laterality: N/A;  11:00am   CYSTOSCOPY/URETEROSCOPY/HOLMIUM LASER/STENT PLACEMENT Right 08/01/2017   Procedure: CYSTOSCOPY/ RIGHT URETEROSCOPY/ RIGHT RETROGRADE/ HOLMIUM LASER  AND RIGHT STENT PLACEMENT FIRST STAGE;  Surgeon: Irine Seal, MD;  Location: [facility];  Service: Urology;  Laterality: Right;   CYSTOSCOPY/URETEROSCOPY/HOLMIUM LASER/STENT PLACEMENT Right 08/08/2017   Procedure: RIGHT URETEROSCOPY WITH HOLMIUM LASER RIGHT STENT PLACEMENT;  Surgeon: Irine Seal, MD;  Location: [facility];  Service: Urology;  Laterality: Right;   EXTRACORPOREAL SHOCK WAVE LITHOTRIPSY Right 08/22/2017   Procedure: RIGHT EXTRACORPOREAL SHOCK WAVE LITHOTRIPSY (ESWL);  Surgeon: Alexis Frock, MD;  Location: [facility];  Service:  Urology;  Laterality: Right;   HEMORRHOID BANDING  07/2012   Dr. Oneida Alar   LAPAROSCOPIC CHOLECYSTECTOMY  1985   LAPAROSCOPIC GASTRIC BANDING  12/12/2010   LEFT AND RIGHT HEART CATHETERIZATION WITH CORONARY ANGIOGRAM N/A 06/03/2012   Procedure: LEFT AND RIGHT HEART CATHETERIZATION WITH CORONARY ANGIOGRAM;  Surgeon: Jolaine Artist, MD;  Location: [facility] CATH LAB;  Service: Cardiovascular;  Laterality: N/A;   PILONIDAL CYST EXCISION  1974   RIGHT HEART CATH N/A 12/20/2022   Procedure: RIGHT HEART CATH;  Surgeon: Jolaine Artist, MD;  Location: [place] CV LAB;  Service: Cardiovascular;  Laterality: N/A;   RIGHT/LEFT HEART CATH AND CORONARY ANGIOGRAPHY N/A 03/28/2020   Procedure: RIGHT/LEFT HEART CATH AND CORONARY ANGIOGRAPHY;  Surgeon: Jolaine Artist, MD;  Location: [place] CV LAB;  Service: Cardiovascular;  Laterality: N/A;   TUMOR EXCISION  10/2011   Left thigh   TUMOR EXCISION  10/2011   Face    OB/GYN HISTORY:  OB History  Gravida Para Term Preterm AB Living  3 0 0 0 3 0  SAB IAB Ectopic Multiple Live Births  3 0 0 0      # Outcome Date GA Lbr Len/2nd Weight Sex Delivery Anes PTL Lv  3 SAB           2 SAB           1 SAB             Patient's last menstrual period was 06/06/2013.  Age at menarche: 59 Age at menopause: 73 Hx of HRT: Denies Hx of STDs: Denies Last pap: 09/2022 - NIML, HR HPV negative History of abnormal pap smears: Denies  SCREENING STUDIES:  Last mammogram: 2023  Last colonoscopy: 2022  MEDICATIONS: Outpatient Encounter Medications as of 01/11/2023  Medication Sig   albuterol (PROVENTIL) (2.5 MG/3ML) 0.083% nebulizer solution Take 2.5 mg by nebulization every 4 (four) hours as needed for wheezing or shortness of breath.    albuterol (VENTOLIN HFA) 108 (90 Base) MCG/ACT inhaler Inhale 2 puffs into the lungs every 6 (six) hours as needed for wheezing or shortness of breath.   ALPRAZolam (XANAX) 0.5 MG tablet Take 0.5 mg by mouth at bedtime as  needed for anxiety or sleep.   amLODipine (NORVASC) 10 MG tablet Take 10 mg by mouth every evening.    Ascorbic Acid (VITAMIN C) 500 MG CAPS Take 500 mg by mouth daily.   baclofen (LIORESAL) 10 MG tablet TAKE 1 TO 2 TABLETS FOUR TIMES DAILY (Patient taking differently: Take 5-10 tablets by mouth 4 (four) times daily.)   Blood Glucose Calibration (TRUE METRIX LEVEL 2) Normal SOLN    Blood Glucose Monitoring Suppl (TRUE METRIX AIR GLUCOSE METER) w/Device KIT    Cholecalciferol (DIALYVITE VITAMIN D 5000) 125 MCG (5000 UT) capsule Take 5,000 Units by mouth once a week.   clobetasol (TEMOVATE) 0.05 % external solution Apply 1 application  topically 2 (two) times daily.   EPINEPHrine 0.3 mg/0.3 mL IJ SOAJ injection Inject 0.3 mg into the muscle as needed for anaphylaxis.   fenofibrate 160 MG tablet Take 160 mg by mouth daily.   fluticasone (FLONASE) 50 MCG/ACT nasal spray Place 1 spray into both nostrils daily.    gabapentin (NEURONTIN) 100 MG capsule Take 2 capsules (200 mg total) by mouth 4 (four) times daily.   isosorbide mononitrate (IMDUR) 30 MG 24 hr tablet Take 30 mg by mouth daily.    levocetirizine (XYZAL) 5 MG tablet Take 5 mg by mouth at bedtime.   linaclotide (LINZESS) 72 MCG capsule Take 1 capsule (72 mcg total) by mouth daily before breakfast.   magnesium oxide (MAG-OX) 400 (240 Mg) MG tablet Take 400 mg by mouth daily.   Multiple Vitamin (MULTIVITAMIN WITH MINERALS) TABS tablet Take 1 tablet by mouth daily.   nitroGLYCERIN (NITROSTAT) 0.4 MG SL tablet Place 0.4 mg under the tongue every 5 (five) minutes as needed for chest pain.   ondansetron (ZOFRAN) 4 MG tablet Take 4 mg by mouth every 8 (eight) hours as needed for nausea or vomiting.    oxybutynin (DITROPAN-XL) 10 MG 24 hr tablet Take 1 tablet (10 mg total) by mouth daily as needed.   Oxycodone HCl 10 MG TABS Take 10 mg by mouth every 6 (six) hours as needed (pain).  potassium chloride SA (K-DUR) 20 MEQ tablet Take 20 mEq by mouth  daily.    pravastatin (PRAVACHOL) 40 MG tablet Take 40 mg by mouth daily.   RESTASIS 0.05 % ophthalmic emulsion Place 1 drop into both eyes 2 (two) times daily.   SYMBICORT 160-4.5 MCG/ACT inhaler Inhale 2 puffs into the lungs 2 (two) times daily.   Torsemide 40 MG TABS Take 120 mg by mouth daily for 3 days, THEN 80 mg daily.   TRUE METRIX BLOOD GLUCOSE TEST test strip    TRUEplus Lancets 65K MISC    TRULICITY 1.5 CL/2.7NT SOPN Inject 1.5 mg into the skin every 7 (seven) days.   [DISCONTINUED] torsemide 40 MG TABS Take 40 mg by mouth daily. (Patient not taking: Reported on 01/08/2023)   No facility-administered encounter medications on file as of 01/11/2023.    ALLERGIES:  Allergies  Allergen Reactions   Ancef [Cefazolin] Anaphylaxis and Shortness Of Breath        Bee Venom Anaphylaxis    As a child   Meloxicam     Gi upset    Nsaids Other (See Comments)    Rectal bleeding, stopped heart    Ceftin [Cefuroxime Axetil] Swelling    Swollen tongue   Chicken Meat (Diagnostic) Nausea And Vomiting   Ciprofloxacin Cough    Coughed up blood   Macrodantin [Nitrofurantoin] Other (See Comments)    Vomited blood and had dark stool.   Meat [Alpha-Gal] Nausea And Vomiting   Other Other (See Comments)    Snake venom - Scaly Scalp, SOB, Night Terrors   Latex Itching and Rash   Milk-Related Compounds Other (See Comments)    IBS     FAMILY HISTORY:  Family History  Problem Relation Age of Onset   Diabetes Mother    Heart failure Mother    CAD Father    Hypertension Father    Heart failure Father    Kidney disease Father    Breast cancer Sister    Other Maternal Grandmother        tonsilitis   Diabetes Maternal Grandfather    Hypertension Maternal Grandfather    Hypertension Paternal Grandmother    Stroke Paternal Grandmother    Cancer Paternal Grandfather        throat   Breast cancer Paternal Aunt    Breast cancer Paternal Aunt      SOCIAL HISTORY:  Social Connections:  Moderately Isolated (10/09/2022)   Social Connection and Isolation Panel [NHANES]    Frequency of Communication with Friends and Family: Once a week    Frequency of Social Gatherings with Friends and Family: Once a week    Attends Religious Services: More than 4 times per year    Active Member of [employer] or Organizations: Yes    Attends Archivist Meetings: 1 to 4 times per year    Marital Status: Never married    REVIEW OF SYSTEMS:  + Decreased appetite, chills, fatigue, cough, voice changes, shortness of breath, wheezing, chest pain, swelling of the legs, palpitations, bloating, constipation, pelvic pain, vaginal spotting, joint pain, back pain, muscle pain, rash, dizziness, migraines, numbness, easy bruising/bleeding, anxiety, decreased concentration Denies fevers, unexplained weight changes. Denies hearing loss, neck lumps or masses, mouth sores, ringing in ears. Denies abdominal pain, blood in stools, diarrhea, nausea, vomiting, or early satiety. Denies pain with intercourse, dysuria, frequency, hematuria or incontinence. Denies joint pain, back pain or muscle pain/cramps. Denies seizures. Denies swollen lymph nodes or glands. Denies depression.  Physical Exam:  Vital Signs for this encounter:  Blood pressure 112/62, pulse 88, temperature 98.2 F (36.8 C), temperature source Oral, resp. rate 14, height 4' 10.19" (1.478 m), weight 248 lb (112.5 kg), last menstrual period 06/06/2013, SpO2 95 %. Body mass index is 51.5 kg/m. General: Alert, oriented, no acute distress.  HEENT: Normocephalic, atraumatic. Sclera anicteric.  Chest: Mild expiratory wheezing bilaterally, decreased breath sounds at lung bases although somewhat limited by body habitus. Cardiovascular: Regular rate and rhythm, no murmurs, rubs, or gallops.  Abdomen: Obese. Normoactive bowel sounds. Soft, nondistended, nontender to palpation. Large RUQ scar. No masses or hepatosplenomegaly appreciated. No palpable  fluid wave.  Rash underneath pannus consistent with candidiasis. Extremities: Grossly normal range of motion. Warm, well perfused. 1+ edema bilaterally.  Skin: No rashes or lesions.  Lymphatics: No cervical, supraclavicular, or inguinal adenopathy.  GU:  Normal external female genitalia. No lesions. No discharge or bleeding.             Bladder/urethra:  No lesions or masses, well supported bladder             Vagina: Mildly atrophic, no lesions noted.             Cervix: Normal appearing, no lesions.             Uterus: Exam limited by body habitus.  Small, mobile, no parametrial involvement or nodularity.  Unable to palpate suspected calcified fibroid seen on imaging.             Adnexa: no masses appreciated.  Rectal: Deferred.  LABORATORY AND RADIOLOGIC DATA:  Outside medical records were reviewed to synthesize the above history, along with the history and physical obtained during the visit.   Lab Results  Component Value Date   WBC 12.1 (H) 12/24/2022   HGB 10.8 (L) 12/24/2022   HCT 32.8 (L) 12/24/2022   PLT 254 12/24/2022   GLUCOSE 127 (H) 01/08/2023   CHOL 132 06/03/2012   TRIG 262 (H) 06/03/2012   HDL 49 06/03/2012   LDLCALC 31 06/03/2012   ALT 16 12/23/2022   AST 24 12/23/2022   NA 137 01/08/2023   K 4.9 01/08/2023   CL 103 01/08/2023   CREATININE 2.71 (H) 01/08/2023   BUN 55 (H) 01/08/2023   CO2 24 01/08/2023   TSH 1.875 05/29/2017   INR 1.1 12/23/2022   HGBA1C 6.1 (H) 11/19/2022

## 2023-01-11 ENCOUNTER — Encounter: Payer: Self-pay | Admitting: Gynecologic Oncology

## 2023-01-11 ENCOUNTER — Inpatient Hospital Stay: Payer: Medicare HMO | Attending: Gynecologic Oncology | Admitting: Gynecologic Oncology

## 2023-01-11 ENCOUNTER — Other Ambulatory Visit: Payer: Self-pay

## 2023-01-11 VITALS — BP 112/62 | HR 88 | Temp 98.2°F | Resp 14 | Ht 58.19 in | Wt 248.0 lb

## 2023-01-11 DIAGNOSIS — R6889 Other general symptoms and signs: Secondary | ICD-10-CM | POA: Diagnosis not present

## 2023-01-11 DIAGNOSIS — M1990 Unspecified osteoarthritis, unspecified site: Secondary | ICD-10-CM | POA: Insufficient documentation

## 2023-01-11 DIAGNOSIS — E119 Type 2 diabetes mellitus without complications: Secondary | ICD-10-CM | POA: Insufficient documentation

## 2023-01-11 DIAGNOSIS — I509 Heart failure, unspecified: Secondary | ICD-10-CM | POA: Insufficient documentation

## 2023-01-11 DIAGNOSIS — Z803 Family history of malignant neoplasm of breast: Secondary | ICD-10-CM | POA: Diagnosis not present

## 2023-01-11 DIAGNOSIS — F419 Anxiety disorder, unspecified: Secondary | ICD-10-CM | POA: Insufficient documentation

## 2023-01-11 DIAGNOSIS — I272 Pulmonary hypertension, unspecified: Secondary | ICD-10-CM | POA: Diagnosis not present

## 2023-01-11 DIAGNOSIS — Z7985 Long-term (current) use of injectable non-insulin antidiabetic drugs: Secondary | ICD-10-CM | POA: Insufficient documentation

## 2023-01-11 DIAGNOSIS — Z6841 Body Mass Index (BMI) 40.0 and over, adult: Secondary | ICD-10-CM | POA: Diagnosis not present

## 2023-01-11 DIAGNOSIS — K219 Gastro-esophageal reflux disease without esophagitis: Secondary | ICD-10-CM | POA: Diagnosis not present

## 2023-01-11 DIAGNOSIS — G4733 Obstructive sleep apnea (adult) (pediatric): Secondary | ICD-10-CM | POA: Insufficient documentation

## 2023-01-11 DIAGNOSIS — E1122 Type 2 diabetes mellitus with diabetic chronic kidney disease: Secondary | ICD-10-CM | POA: Diagnosis not present

## 2023-01-11 DIAGNOSIS — J449 Chronic obstructive pulmonary disease, unspecified: Secondary | ICD-10-CM | POA: Diagnosis not present

## 2023-01-11 DIAGNOSIS — E669 Obesity, unspecified: Secondary | ICD-10-CM

## 2023-01-11 DIAGNOSIS — I130 Hypertensive heart and chronic kidney disease with heart failure and stage 1 through stage 4 chronic kidney disease, or unspecified chronic kidney disease: Secondary | ICD-10-CM | POA: Insufficient documentation

## 2023-01-11 DIAGNOSIS — I252 Old myocardial infarction: Secondary | ICD-10-CM | POA: Insufficient documentation

## 2023-01-11 DIAGNOSIS — I2510 Atherosclerotic heart disease of native coronary artery without angina pectoris: Secondary | ICD-10-CM | POA: Insufficient documentation

## 2023-01-11 DIAGNOSIS — K5900 Constipation, unspecified: Secondary | ICD-10-CM | POA: Diagnosis not present

## 2023-01-11 DIAGNOSIS — E781 Pure hyperglyceridemia: Secondary | ICD-10-CM | POA: Insufficient documentation

## 2023-01-11 DIAGNOSIS — B379 Candidiasis, unspecified: Secondary | ICD-10-CM

## 2023-01-11 DIAGNOSIS — N184 Chronic kidney disease, stage 4 (severe): Secondary | ICD-10-CM

## 2023-01-11 DIAGNOSIS — C541 Malignant neoplasm of endometrium: Secondary | ICD-10-CM | POA: Insufficient documentation

## 2023-01-11 DIAGNOSIS — Z79899 Other long term (current) drug therapy: Secondary | ICD-10-CM | POA: Diagnosis not present

## 2023-01-11 DIAGNOSIS — I4891 Unspecified atrial fibrillation: Secondary | ICD-10-CM | POA: Insufficient documentation

## 2023-01-11 NOTE — Patient Instructions (Signed)
We will tentatively plan for surgery at [facility] with Dr. Jeral Pinch on February 20, 2023. We will see you back in the office closer to the date for a preop appointment with Dr. Berline Lopes and Joylene John NP to discuss the instructions for before and after surgery.  You may also receive a phone call from the hospital to arrange for a pre-op appointment there as well. Usually both appointments can be combined on the same day.   We are also going to work on obtaining recommendations, risk stratification from your providers regarding the surgery.

## 2023-01-15 ENCOUNTER — Telehealth: Payer: Self-pay | Admitting: *Deleted

## 2023-01-15 NOTE — Telephone Encounter (Signed)
Per Dr Berline Lopes fax surgical optimization form to the patient's pulmonary, cardiology, nephrology and PCP office.

## 2023-01-16 ENCOUNTER — Encounter: Payer: Self-pay | Admitting: Internal Medicine

## 2023-01-16 ENCOUNTER — Ambulatory Visit (INDEPENDENT_AMBULATORY_CARE_PROVIDER_SITE_OTHER): Payer: Medicare HMO | Admitting: Internal Medicine

## 2023-01-16 VITALS — BP 112/68 | HR 109 | Temp 98.0°F | Ht <= 58 in | Wt 248.9 lb

## 2023-01-16 DIAGNOSIS — K5904 Chronic idiopathic constipation: Secondary | ICD-10-CM

## 2023-01-16 DIAGNOSIS — R140 Abdominal distension (gaseous): Secondary | ICD-10-CM

## 2023-01-16 NOTE — Patient Instructions (Signed)
Continue Linzess for your chronic constipation. If you need refills, let us know.   We will hold off on colonoscopy for now given your recent cancer diagnosis.   Follow up in 6 months or sooner if needed.   It was nice seeing you again today.   Dr. Abbey Chatters

## 2023-01-16 NOTE — Progress Notes (Signed)
Referring Provider: Sharilyn Sites, MD Primary Care Physician:  Sharilyn Sites, MD Primary GI:  Dr. Abbey Chatters  Chief Complaint  Patient presents with   Constipation    Follow up on constipation. Takes linzess as needed when she eats a lot of bread. Also has some nausea that zofran helps with.      HPI:   Sara Tran is a 67 y.o. female who presents to the clinic today for follow-up visit.  She has longstanding history of constipation.  Patient is chronically on opioids.    Last colonoscopy 08/09/20, her colon was not prepped properly so procedure was aborted.  Last colonoscopy in 2011 showed tortuous colon, diverticulosis, one sigmoid polyp which was benign.  Random colon biopsies were negative for microscopic colitis at that time.   For her constipation, she is chronically on Linzess to 72 mcg as needed.  She was previously on Linzess 145 mcg daily which she states was too much.  Notes trying to eat healthier lately by including more fruits and vegetables in her diet.   Past Medical History:  Diagnosis Date   Adnexal cyst    Right simple cyst seen on [place]   Anginal pain (HCC)    Anxiety    Arthritis    Asthma    Atrial fibrillation (HCC)    hx of    CHF (congestive heart failure) (HCC)    Chronic back pain    Chronic hip pain    COPD (chronic obstructive pulmonary disease) ([place])    Coronary artery disease    stents placed    Dyspnea    GERD (gastroesophageal reflux disease)    Headache    due to pinched nerve damaage    Heart murmur    hx of slight murmur    History of bronchitis    History of cardiac catheterization    Normal coronaries 2013   History of kidney stones    Hot flashes    Hypertension    Hypertriglyceridemia    IBS (irritable bowel syndrome)    Internal hemorrhoids    Kidney disease    Morbid obesity ([place])    s/p lap band surgery   Myocardial infarction ([place]) 12/2018   times 3   Obesity    Pulmonary HTN ([place])    [place] 2008:  RA pressures 13/10  with a mean of 7 mmHg.  RV pressure 49/70 with end diastolic pressure of 15 mmHg.  PA pressure 49/23 with a mean of 35 mmHg.  Pulmonary capillary wedge 17/50 with a mean of 14 mmHg. The PA saturation was 68%.  RA saturation was 71% and aortic saturation was 90%.  Cardiac output was 6.0 with a cardiac index of 2.80 by [place].  Normal coronaries by cath 2008.   Renal insufficiency    Sciatica of right side    Sleep apnea    CPAP use does not know settings    Trauma    Type 2 diabetes mellitus ([place])    type II    Uterine fibroid     Past Surgical History:  Procedure Laterality Date   accupuncture     APPENDECTOMY     CARDIAC CATHETERIZATION  07/2007, 05/2012   Normal coronary arteries   COLONOSCOPY  10/18/2010   SLF:2-mm sessile sigmoid polyp/diverticula, tortuous colon.  Biopsies of sigmoid colon polyp and random colon biopsies benign   COLONOSCOPY WITH PROPOFOL N/A 08/09/2020   Procedure: COLONOSCOPY WITH PROPOFOL;  Surgeon: Eloise Harman, DO;  [facility];  Service: Endoscopy;  Laterality: N/A;  11:00am   CYSTOSCOPY/URETEROSCOPY/HOLMIUM LASER/STENT PLACEMENT Right 08/01/2017   Procedure: CYSTOSCOPY/ RIGHT URETEROSCOPY/ RIGHT RETROGRADE/ HOLMIUM LASER  AND RIGHT STENT PLACEMENT FIRST STAGE;  Surgeon: Irine Seal, MD;  Location: [facility];  Service: Urology;  Laterality: Right;   CYSTOSCOPY/URETEROSCOPY/HOLMIUM LASER/STENT PLACEMENT Right 08/08/2017   Procedure: RIGHT URETEROSCOPY WITH HOLMIUM LASER RIGHT STENT PLACEMENT;  Surgeon: Irine Seal, MD;  Location: [facility];  Service: Urology;  Laterality: Right;   EXTRACORPOREAL SHOCK WAVE LITHOTRIPSY Right 08/22/2017   Procedure: RIGHT EXTRACORPOREAL SHOCK WAVE LITHOTRIPSY (ESWL);  Surgeon: Alexis Frock, MD;  Location: [facility];  Service: Urology;  Laterality: Right;   HEMORRHOID BANDING  07/2012   Dr. Oneida Alar   LAPAROSCOPIC CHOLECYSTECTOMY  1985   laparotomy   LAPAROSCOPIC GASTRIC BANDING  12/12/2010   LEFT AND RIGHT HEART  CATHETERIZATION WITH CORONARY ANGIOGRAM N/A 06/03/2012   Procedure: LEFT AND RIGHT HEART CATHETERIZATION WITH CORONARY ANGIOGRAM;  Surgeon: Jolaine Artist, MD;  Location: [facility] CATH LAB;  Service: Cardiovascular;  Laterality: N/A;   PILONIDAL CYST EXCISION  1974   RIGHT HEART CATH N/A 12/20/2022   Procedure: RIGHT HEART CATH;  Surgeon: Jolaine Artist, MD;  Location: [place] CV LAB;  Service: Cardiovascular;  Laterality: N/A;   RIGHT/LEFT HEART CATH AND CORONARY ANGIOGRAPHY N/A 03/28/2020   Procedure: RIGHT/LEFT HEART CATH AND CORONARY ANGIOGRAPHY;  Surgeon: Jolaine Artist, MD;  Location: [place] [facility] LAB;  Service: Cardiovascular;  Laterality: N/A;   TUMOR EXCISION  10/2011   Left thigh   TUMOR EXCISION  10/2011   Face    Current Outpatient Medications  Medication Sig Dispense Refill   albuterol (PROVENTIL) (2.5 MG/3ML) 0.083% nebulizer solution Take 2.5 mg by nebulization every 4 (four) hours as needed for wheezing or shortness of breath.      albuterol (VENTOLIN HFA) 108 (90 Base) MCG/ACT inhaler Inhale 2 puffs into the lungs every 6 (six) hours as needed for wheezing or shortness of breath. 18 g 0   ALPRAZolam (XANAX) 0.5 MG tablet Take 0.5 mg by mouth at bedtime as needed for anxiety or sleep.     amLODipine (NORVASC) 10 MG tablet Take 10 mg by mouth every evening.      baclofen (LIORESAL) 10 MG tablet TAKE 1 TO 2 TABLETS FOUR TIMES DAILY (Patient taking differently: Take 5-10 tablets by mouth 4 (four) times daily.) 720 tablet 3   Blood Glucose Calibration (TRUE METRIX LEVEL 2) Normal SOLN      Blood Glucose Monitoring Suppl (TRUE METRIX AIR GLUCOSE METER) w/Device KIT      clobetasol (TEMOVATE) 0.05 % external solution Apply 1 application  topically 2 (two) times daily.     EPINEPHrine 0.3 mg/0.3 mL IJ SOAJ injection Inject 0.3 mg into the muscle as needed for anaphylaxis.     fenofibrate 160 MG tablet Take 160 mg by mouth daily.     fluticasone (FLONASE) 50 MCG/ACT  nasal spray Place 1 spray into both nostrils daily.      gabapentin (NEURONTIN) 100 MG capsule Take 2 capsules (200 mg total) by mouth 4 (four) times daily. 240 capsule 1   isosorbide mononitrate (IMDUR) 30 MG 24 hr tablet Take 30 mg by mouth daily.      levocetirizine (XYZAL) 5 MG tablet Take 5 mg by mouth at bedtime.     linaclotide (LINZESS) 72 MCG capsule Take 1 capsule (72 mcg total) by mouth daily before breakfast. 90 capsule 3   magnesium  oxide (MAG-OX) 400 (240 Mg) MG tablet Take 400 mg by mouth daily.     nitroGLYCERIN (NITROSTAT) 0.4 MG SL tablet Place 0.4 mg under the tongue every 5 (five) minutes as needed for chest pain.     ondansetron (ZOFRAN) 4 MG tablet Take 4 mg by mouth every 8 (eight) hours as needed for nausea or vomiting.      oxybutynin (DITROPAN-XL) 10 MG 24 hr tablet Take 1 tablet (10 mg total) by mouth daily as needed.     Oxycodone HCl 10 MG TABS Take 10 mg by mouth every 6 (six) hours as needed (pain).     potassium chloride SA (K-DUR) 20 MEQ tablet Take 20 mEq by mouth daily.      pravastatin (PRAVACHOL) 40 MG tablet Take 40 mg by mouth daily.     PRESCRIPTION MEDICATION Hydralazine '10mg'$  one tid per patient   Losartan/ hctz one daily per patient     RESTASIS 0.05 % ophthalmic emulsion Place 1 drop into both eyes 2 (two) times daily.     SYMBICORT 160-4.5 MCG/ACT inhaler Inhale 2 puffs into the lungs 2 (two) times daily. 30.6 g 3   TRUE METRIX BLOOD GLUCOSE TEST test strip      TRUEplus Lancets 91Q MISC      TRULICITY 1.5 XI/5.0TU SOPN Inject 1.5 mg into the skin every 7 (seven) days.     Multiple Vitamin (MULTIVITAMIN WITH MINERALS) TABS tablet Take 1 tablet by mouth daily. (Patient not taking: Reported on 01/16/2023)     Torsemide 40 MG TABS Take 120 mg by mouth daily for 3 days, THEN 80 mg daily. 66 tablet 6   No current facility-administered medications for this visit.    Allergies as of 01/16/2023 - Review Complete 01/16/2023  Allergen Reaction Noted    Ancef [cefazolin] Anaphylaxis and Shortness Of Breath 07/23/2017   Bee venom Anaphylaxis 05/04/2017   Meloxicam  03/16/2020   Nsaids Other (See Comments) 03/16/2020   Ceftin [cefuroxime axetil] Swelling 10/06/2013   Chicken meat (diagnostic) Nausea And Vomiting 03/16/2020   Ciprofloxacin Cough 09/27/2011   Macrodantin [nitrofurantoin] Other (See Comments) 07/23/2017   Meat [alpha-gal] Nausea And Vomiting 03/16/2020   Other Other (See Comments) 07/23/2017   Latex Itching and Rash 04/22/2012   Milk-related compounds Other (See Comments) 11/21/2016    Family History  Problem Relation Age of Onset   Diabetes Mother    Heart failure Mother    CAD Father    Hypertension Father    Heart failure Father    Kidney disease Father    Breast cancer Sister    Other Maternal Grandmother        tonsilitis   Diabetes Maternal Grandfather    Hypertension Maternal Grandfather    Hypertension Paternal Grandmother    Stroke Paternal Grandmother    Cancer Paternal Grandfather        throat   Breast cancer Paternal Aunt    Breast cancer Paternal Aunt     Social History   Socioeconomic History   Marital status: Single    Spouse name: Not on file   Number of children: 0   Years of education: Not on file   Highest education level: Not on file  Occupational History   Occupation: Volunteers   Occupation: Midwife    Comment: Retired from [employer] home in [place] Use   Smoking status: Former    Types: Cigarettes    Quit date: 08/08/1996    Years since quitting:  26.4    Passive exposure: Past   Smokeless tobacco: Never  Vaping Use   Vaping Use: Never used  Substance and Sexual Activity   Alcohol use: No   Drug use: No   Sexual activity: Not Currently    Birth control/protection: Post-menopausal  Other Topics Concern   Not on file  Social History Narrative   Not on file   Social Determinants of Health   Financial Resource Strain: Medium Risk (10/09/2022)   Overall  Financial Resource Strain (CARDIA)    Difficulty of Paying Living Expenses: Somewhat hard  Food Insecurity: Food Insecurity Present (10/09/2022)   Hunger Vital Sign    Worried About Running Out of Food in the Last Year: Sometimes true    Ran Out of Food in the Last Year: Sometimes true  Transportation Needs: Unmet Transportation Needs (10/09/2022)   PRAPARE - Hydrologist (Medical): Yes    Lack of Transportation (Non-Medical): Yes  Physical Activity: Insufficiently Active (10/09/2022)   Exercise Vital Sign    Days of Exercise per Week: 2 days    Minutes of Exercise per Session: 10 min  Stress: Stress Concern Present (10/09/2022)   [place]    Feeling of Stress : To some extent  Social Connections: Moderately Isolated (10/09/2022)   Social Connection and Isolation Panel [NHANES]    Frequency of Communication with Friends and Family: Once a week    Frequency of Social Gatherings with Friends and Family: Once a week    Attends Religious Services: More than 4 times per year    Active Member of [employer] or Organizations: Yes    Attends Archivist Meetings: 1 to 4 times per year    Marital Status: Never married    Subjective: Review of Systems  Constitutional:  Negative for chills and fever.  HENT:  Negative for congestion and hearing loss.   Eyes:  Negative for blurred vision and double vision.  Respiratory:  Negative for cough and shortness of breath.   Cardiovascular:  Negative for chest pain and palpitations.  Gastrointestinal:  Positive for constipation. Negative for abdominal pain, blood in stool, diarrhea, heartburn, melena and vomiting.  Genitourinary:  Negative for dysuria and urgency.  Musculoskeletal:  Negative for joint pain and myalgias.  Skin:  Negative for itching and rash.  Neurological:  Negative for dizziness and headaches.  Psychiatric/Behavioral:  Negative for  depression. The patient is not nervous/anxious.      Objective: BP 112/68 (BP Location: Right Arm, Patient Position: Sitting, Cuff Size: Large)   Pulse (!) 109   Temp 98 F (36.7 C) (Oral)   Ht '4\' 10"'$  (1.473 m)   Wt 248 lb 14.4 oz (112.9 kg)   LMP 06/06/2013   BMI 52.02 kg/m  Physical Exam Constitutional:      Appearance: Normal appearance. She is obese.  HENT:     Head: Normocephalic and atraumatic.  Eyes:     Extraocular Movements: Extraocular movements intact.     Conjunctiva/sclera: Conjunctivae normal.  Cardiovascular:     Rate and Rhythm: Normal rate and regular rhythm.  Pulmonary:     Effort: Pulmonary effort is normal.     Breath sounds: Normal breath sounds.  Abdominal:     General: Bowel sounds are normal.     Palpations: Abdomen is soft.  Musculoskeletal:        General: No swelling. Normal range of motion.     Cervical back:  Normal range of motion and neck supple.  Skin:    General: Skin is warm and dry.     Coloration: Skin is not jaundiced.  Neurological:     General: No focal deficit present.     Mental Status: She is alert and oriented to person, place, and time.  Psychiatric:        Mood and Affect: Mood normal.        Behavior: Behavior normal.      Assessment: *Constipation *Morbid obesity *Abdominal bloating   Plan: Patient certainly warrants repeat colonoscopy though she would like to hold off for now. This is reasonable given her recent endometrial cancer diagnosis. Consider scheduling on follow up visit pending clinical course.   Linzess 72 mcg refilled in office today.  Recommend she take this every day instead as needed and see how she does. Also counseled she could add MiraLAX to her regimen for breakthrough symptoms as well.  Counseled on adequate fiber in her diet.  She may add Benefiber or Metamucil over-the-counter as well.  Also counseled on importance of drinking at least 4 to 6 glasses of water daily.  She states she will work on  this.  Patient to follow-up in 6 months or sooner if needed  01/16/2023 2:19 PM   Disclaimer: This note was dictated with voice recognition software. Similar sounding words can inadvertently be transcribed and may not be corrected upon review.

## 2023-01-29 ENCOUNTER — Telehealth: Payer: Self-pay

## 2023-01-29 NOTE — Telephone Encounter (Signed)
Received a medical clearance form from [facility] , requesting Cardiology clearance for the following procedure: Endometrial adenocarcinoma tilt test. Medical clearance form was signed by Dr. Glori Bickers and successfully faxed to [phone] on Tuesday, January 30,. Form will be scanned into patients chart.

## 2023-01-30 ENCOUNTER — Encounter: Payer: Medicare HMO | Attending: Physical Medicine & Rehabilitation | Admitting: Physical Medicine & Rehabilitation

## 2023-01-31 ENCOUNTER — Telehealth: Payer: Self-pay | Admitting: *Deleted

## 2023-01-31 NOTE — Telephone Encounter (Signed)
Received surgical optimization form back from the patient's PCP stating that the patient passed away on 02-23-2023

## 2023-01-31 DEATH — deceased

## 2023-02-15 ENCOUNTER — Encounter (HOSPITAL_COMMUNITY): Payer: Medicare HMO

## 2023-02-15 ENCOUNTER — Ambulatory Visit: Payer: Medicare HMO | Admitting: Gynecologic Oncology

## 2023-02-15 ENCOUNTER — Encounter: Payer: Medicare HMO | Admitting: Gynecologic Oncology

## 2023-02-20 ENCOUNTER — Ambulatory Visit: Admit: 2023-02-20 | Payer: Medicare HMO | Admitting: Gynecologic Oncology

## 2023-02-20 DIAGNOSIS — C541 Malignant neoplasm of endometrium: Secondary | ICD-10-CM

## 2023-02-20 SURGERY — HYSTERECTOMY, TOTAL, ROBOT-ASSISTED, LAPAROSCOPIC, WITH BILATERAL SALPINGO-OOPHORECTOMY
Anesthesia: General

## 2023-03-15 ENCOUNTER — Encounter (HOSPITAL_COMMUNITY): Payer: Medicare HMO | Admitting: Internal Medicine

## 2023-03-20 ENCOUNTER — Ambulatory Visit: Payer: Medicare HMO | Admitting: Physical Medicine & Rehabilitation

## 2023-04-16 ENCOUNTER — Ambulatory Visit: Payer: Medicare HMO | Admitting: Pulmonary Disease

## 2023-06-13 ENCOUNTER — Ambulatory Visit: Payer: Medicare HMO | Admitting: Urology

## 2023-07-17 ENCOUNTER — Ambulatory Visit: Payer: Medicare HMO | Admitting: Internal Medicine
